# Patient Record
Sex: Female | Born: 1970 | Race: White | Hispanic: No | Marital: Married | Smoking: Former smoker
Health system: Southern US, Community
[De-identification: ages and names within clinical notes are randomized; demographics above are authoritative.]

## PROBLEM LIST (undated history)

## (undated) DIAGNOSIS — R0609 Other forms of dyspnea: Secondary | ICD-10-CM

## (undated) DIAGNOSIS — E119 Type 2 diabetes mellitus without complications: Secondary | ICD-10-CM

## (undated) DIAGNOSIS — R252 Cramp and spasm: Secondary | ICD-10-CM

## (undated) DIAGNOSIS — K219 Gastro-esophageal reflux disease without esophagitis: Secondary | ICD-10-CM

## (undated) DIAGNOSIS — F329 Major depressive disorder, single episode, unspecified: Secondary | ICD-10-CM

## (undated) DIAGNOSIS — R519 Headache, unspecified: Secondary | ICD-10-CM

## (undated) DIAGNOSIS — M1990 Unspecified osteoarthritis, unspecified site: Secondary | ICD-10-CM

## (undated) DIAGNOSIS — R51 Headache: Secondary | ICD-10-CM

## (undated) DIAGNOSIS — E538 Deficiency of other specified B group vitamins: Secondary | ICD-10-CM

## (undated) DIAGNOSIS — K589 Irritable bowel syndrome without diarrhea: Secondary | ICD-10-CM

## (undated) DIAGNOSIS — I2510 Atherosclerotic heart disease of native coronary artery without angina pectoris: Secondary | ICD-10-CM

## (undated) DIAGNOSIS — J302 Other seasonal allergic rhinitis: Secondary | ICD-10-CM

## (undated) DIAGNOSIS — T7840XA Allergy, unspecified, initial encounter: Secondary | ICD-10-CM

## (undated) DIAGNOSIS — D649 Anemia, unspecified: Secondary | ICD-10-CM

## (undated) DIAGNOSIS — F419 Anxiety disorder, unspecified: Secondary | ICD-10-CM

## (undated) DIAGNOSIS — G43909 Migraine, unspecified, not intractable, without status migrainosus: Secondary | ICD-10-CM

## (undated) DIAGNOSIS — R918 Other nonspecific abnormal finding of lung field: Secondary | ICD-10-CM

## (undated) DIAGNOSIS — L409 Psoriasis, unspecified: Secondary | ICD-10-CM

## (undated) DIAGNOSIS — M62838 Other muscle spasm: Secondary | ICD-10-CM

## (undated) DIAGNOSIS — N200 Calculus of kidney: Secondary | ICD-10-CM

## (undated) DIAGNOSIS — G4730 Sleep apnea, unspecified: Secondary | ICD-10-CM

## (undated) DIAGNOSIS — F32A Depression, unspecified: Secondary | ICD-10-CM

## (undated) DIAGNOSIS — R55 Syncope and collapse: Secondary | ICD-10-CM

## (undated) DIAGNOSIS — E559 Vitamin D deficiency, unspecified: Secondary | ICD-10-CM

## (undated) DIAGNOSIS — J45909 Unspecified asthma, uncomplicated: Secondary | ICD-10-CM

## (undated) DIAGNOSIS — R0902 Hypoxemia: Secondary | ICD-10-CM

## (undated) HISTORY — DX: Unspecified asthma, uncomplicated: J45.909

## (undated) HISTORY — PX: PLANTAR FASCIA SURGERY: SHX746

## (undated) HISTORY — DX: Atherosclerotic heart disease of native coronary artery without angina pectoris: I25.10

## (undated) HISTORY — PX: UPPER GI ENDOSCOPY: SHX6162

## (undated) HISTORY — DX: Calculus of kidney: N20.0

## (undated) HISTORY — DX: Type 2 diabetes mellitus without complications: E11.9

## (undated) HISTORY — DX: Other nonspecific abnormal finding of lung field: R91.8

## (undated) HISTORY — DX: Anemia, unspecified: D64.9

## (undated) HISTORY — PX: TONSILLECTOMY: SUR1361

## (undated) HISTORY — DX: Cramp and spasm: R25.2

## (undated) HISTORY — DX: Vitamin D deficiency, unspecified: E55.9

## (undated) HISTORY — DX: Other forms of dyspnea: R06.09

## (undated) HISTORY — DX: Irritable bowel syndrome, unspecified: K58.9

## (undated) HISTORY — DX: Other seasonal allergic rhinitis: J30.2

## (undated) HISTORY — DX: Other muscle spasm: M62.838

## (undated) HISTORY — DX: Unspecified osteoarthritis, unspecified site: M19.90

## (undated) HISTORY — DX: Syncope and collapse: R55

## (undated) HISTORY — PX: HERNIA REPAIR: SHX51

## (undated) HISTORY — DX: Allergy, unspecified, initial encounter: T78.40XA

## (undated) HISTORY — PX: APPENDECTOMY: SHX54

## (undated) HISTORY — DX: Gastro-esophageal reflux disease without esophagitis: K21.9

## (undated) HISTORY — DX: Hypoxemia: R09.02

---

## 1997-07-08 ENCOUNTER — Inpatient Hospital Stay (HOSPITAL_COMMUNITY): Admission: AD | Admit: 1997-07-08 | Discharge: 1997-07-08 | Payer: Self-pay | Admitting: *Deleted

## 1997-12-07 ENCOUNTER — Encounter: Admission: RE | Admit: 1997-12-07 | Discharge: 1998-03-07 | Payer: Self-pay | Admitting: *Deleted

## 1998-01-14 ENCOUNTER — Inpatient Hospital Stay (HOSPITAL_COMMUNITY): Admission: AD | Admit: 1998-01-14 | Discharge: 1998-01-14 | Payer: Self-pay | Admitting: Obstetrics and Gynecology

## 1998-01-15 ENCOUNTER — Inpatient Hospital Stay (HOSPITAL_COMMUNITY): Admission: AD | Admit: 1998-01-15 | Discharge: 1998-01-15 | Payer: Self-pay | Admitting: *Deleted

## 1998-01-17 ENCOUNTER — Inpatient Hospital Stay (HOSPITAL_COMMUNITY): Admission: AD | Admit: 1998-01-17 | Discharge: 1998-01-17 | Payer: Self-pay | Admitting: Obstetrics and Gynecology

## 1998-02-07 ENCOUNTER — Inpatient Hospital Stay (HOSPITAL_COMMUNITY): Admission: AD | Admit: 1998-02-07 | Discharge: 1998-02-09 | Payer: Self-pay | Admitting: *Deleted

## 1998-02-14 ENCOUNTER — Encounter (HOSPITAL_COMMUNITY): Admission: RE | Admit: 1998-02-14 | Discharge: 1998-05-15 | Payer: Self-pay | Admitting: *Deleted

## 2001-04-11 ENCOUNTER — Other Ambulatory Visit: Admission: RE | Admit: 2001-04-11 | Discharge: 2001-04-11 | Payer: Self-pay | Admitting: Obstetrics and Gynecology

## 2001-08-26 ENCOUNTER — Encounter: Admission: RE | Admit: 2001-08-26 | Discharge: 2001-08-26 | Payer: Self-pay | Admitting: Obstetrics and Gynecology

## 2001-10-13 ENCOUNTER — Encounter: Payer: Self-pay | Admitting: *Deleted

## 2001-10-13 ENCOUNTER — Inpatient Hospital Stay (HOSPITAL_COMMUNITY): Admission: AD | Admit: 2001-10-13 | Discharge: 2001-10-13 | Payer: Self-pay | Admitting: Obstetrics and Gynecology

## 2001-10-26 ENCOUNTER — Inpatient Hospital Stay (HOSPITAL_COMMUNITY): Admission: AD | Admit: 2001-10-26 | Discharge: 2001-10-28 | Payer: Self-pay | Admitting: Obstetrics and Gynecology

## 2001-10-29 ENCOUNTER — Encounter: Admission: RE | Admit: 2001-10-29 | Discharge: 2001-11-28 | Payer: Self-pay | Admitting: Obstetrics and Gynecology

## 2001-12-03 ENCOUNTER — Other Ambulatory Visit: Admission: RE | Admit: 2001-12-03 | Discharge: 2001-12-03 | Payer: Self-pay | Admitting: Obstetrics and Gynecology

## 2003-01-04 ENCOUNTER — Other Ambulatory Visit: Admission: RE | Admit: 2003-01-04 | Discharge: 2003-01-04 | Payer: Self-pay | Admitting: Obstetrics & Gynecology

## 2004-01-17 ENCOUNTER — Ambulatory Visit: Payer: Self-pay

## 2004-01-20 ENCOUNTER — Ambulatory Visit: Payer: Self-pay

## 2004-02-09 ENCOUNTER — Ambulatory Visit: Payer: Self-pay

## 2004-08-16 ENCOUNTER — Ambulatory Visit: Payer: Self-pay

## 2005-03-12 ENCOUNTER — Ambulatory Visit: Payer: Self-pay | Admitting: Unknown Physician Specialty

## 2005-12-26 ENCOUNTER — Ambulatory Visit: Payer: Self-pay | Admitting: Gastroenterology

## 2006-08-30 ENCOUNTER — Inpatient Hospital Stay (HOSPITAL_COMMUNITY): Admission: RE | Admit: 2006-08-30 | Discharge: 2006-09-06 | Payer: Self-pay | Admitting: Obstetrics and Gynecology

## 2006-09-03 ENCOUNTER — Encounter: Payer: Self-pay | Admitting: Obstetrics and Gynecology

## 2006-09-06 ENCOUNTER — Encounter: Payer: Self-pay | Admitting: Obstetrics and Gynecology

## 2006-12-17 ENCOUNTER — Inpatient Hospital Stay (HOSPITAL_COMMUNITY): Admission: RE | Admit: 2006-12-17 | Discharge: 2006-12-19 | Payer: Self-pay | Admitting: Obstetrics and Gynecology

## 2008-02-20 HISTORY — PX: BLADDER SURGERY: SHX569

## 2008-02-20 HISTORY — PX: CHOLECYSTECTOMY: SHX55

## 2008-11-22 ENCOUNTER — Ambulatory Visit: Payer: Self-pay | Admitting: Physician Assistant

## 2008-12-27 ENCOUNTER — Ambulatory Visit: Payer: Self-pay | Admitting: Surgery

## 2008-12-27 ENCOUNTER — Ambulatory Visit: Payer: Self-pay | Admitting: Anesthesiology

## 2009-02-08 ENCOUNTER — Ambulatory Visit (HOSPITAL_COMMUNITY): Admission: RE | Admit: 2009-02-08 | Discharge: 2009-02-08 | Payer: Self-pay | Admitting: Obstetrics and Gynecology

## 2009-02-11 ENCOUNTER — Inpatient Hospital Stay (HOSPITAL_COMMUNITY): Admission: AD | Admit: 2009-02-11 | Discharge: 2009-02-11 | Payer: Self-pay | Admitting: Obstetrics and Gynecology

## 2009-03-24 ENCOUNTER — Encounter: Admission: RE | Admit: 2009-03-24 | Discharge: 2009-03-24 | Payer: Self-pay | Admitting: Obstetrics and Gynecology

## 2010-02-19 HISTORY — PX: LAPAROSCOPIC GASTRIC SLEEVE RESECTION: SHX5895

## 2010-05-22 LAB — BASIC METABOLIC PANEL
Chloride: 105 mEq/L (ref 96–112)
Creatinine, Ser: 0.53 mg/dL (ref 0.4–1.2)
GFR calc Af Amer: >60 mL/min (ref >60)
Potassium: 3.6 mEq/L (ref 3.5–5.1)
Sodium: 135 mEq/L (ref 135–145)

## 2010-05-22 LAB — CBC
HCT: 39.5 % (ref 36.0–46.0)
Hemoglobin: 13.2 g/dL (ref 12.0–15.0)
MCV: 86.5 fL (ref 78.0–100.0)
RBC: 4.56 MIL/uL (ref 3.87–5.11)
WBC: 9.4 10*3/uL (ref 4.0–10.5)

## 2010-05-22 LAB — HCG, SERUM, QUALITATIVE: Preg, Serum: NEGATIVE

## 2010-07-04 NOTE — H&P (Signed)
NAME:  Claire [name], Claire [name] NO.:  [id number]   MEDICAL RECORD NO.:  [id number]          PATIENT TYPE:  INP   LOCATION:                                FACILITY:  [facility]   PHYSICIAN:  Lenoard Aden, M.D.DATE OF BIRTH:  1970/11/08   DATE OF ADMISSION:  DATE OF DISCHARGE:                              HISTORY & PHYSICAL   CHIEF COMPLAINT:  Advanced dilatation with a history of worsening  diabetic control for induction.   HISTORY:  She is a 40 year old white female G3, P-2 at 37+ weeks  gestation at 5 cm dilatation with new onset borderline glucose control  and borderline elevated blood pressures for augmentation and delivery.   ALLERGIES:  NO KNOWN DRUG ALLERGIES.   MEDICATIONS:  1. Glyburide.  2. Prenatal vitamins.   SOCIAL HISTORY:  She is a nonsmoker and nondrinker.  Denies domestic  physical violence.   PAST MEDICAL HISTORY:  1. History of spontaneous vaginal delivery x 2.  2. History of emergency cerclage with a gestation at 21 weeks followed      biweekly for gestational and p.r.n. Procardia use.   PHYSICAL EXAMINATION:  GENERAL:  She is a well-developed, well-nourished  white female in no acute distress.  HEENT:  Normal.  LUNGS:  Clear.  HEART:  Regular.  ABDOMEN:  Soft, gravid and nontender.  Estimated fetal weight is 7.5 to  8 pounds.  Cervix is 5 cm and 100%, vertex and 0 station.  EXTREMITIES:  Reveal no cords.  NEUROLOGIC:  Nonfocal.  SKIN:  Intact.   IMPRESSION:  1. A 37 week intrauterine pregnancy.  2. Non-insulin-dependent diabetes mellitus with worsening control.  3. Borderline elevation of blood pressure.   PLAN:  1. We will monitor blood pressure closely.  2. Epidural as needed.  3. Check blood glucoses every 2 hours and Glucommander as needed.  4. Anticipated attempts at vaginal delivery.      Lenoard Aden, M.D.  Electronically Signed     RJT/MEDQ  D:  12/17/2006  T:  12/17/2006  Job:  [id number]

## 2010-07-04 NOTE — Op Note (Signed)
NAMESHARAYA, [name] NO.:  [id number]   MEDICAL RECORD NO.:  [id number]          PATIENT TYPE:  INP   LOCATION:  9115                          FACILITY:  [facility]   PHYSICIAN:  Lenoard Aden, M.D.DATE OF BIRTH:  05-13-1970   DATE OF PROCEDURE:  08/30/2006  DATE OF DISCHARGE:                               OPERATIVE REPORT   PREOPERATIVE DIAGNOSIS:  A 21-week intrauterine pregnancy with cervical  insufficiency.   POSTOPERATIVE DIAGNOSIS:  A 21-week intrauterine pregnancy with cervical  insufficiency.   PROCEDURE:  Emergency McDonald cervical cerclage.   SURGEON:  Lenoard Aden, M.D.   ASSISTANT:  _Lavoie_________   ANESTHESIA:  Spinal by Quillian Quince, M.D.   ESTIMATED BLOOD LOSS:  50 mL.   COMPLICATIONS:  None.   DRAINS:  Foley.   COUNTS:  Correct.   DISPOSITION:  The patient went to Recovery in good condition.   PROCEDURE IN DETAIL:  After being apprised of the risks of anesthesia,  infection, bleeding, injury to abdominal organs with need for repair,  possibility of rupture of membranes, possibility of infection, need for  cerclage removal, the patient was brought to the operating room.  She  was administered a spinal anesthetic without complications.  Her feet  were placed in the stirrups.  The patient was prepped and draped in  usual sterile fashion.  A Foley catheter was placed.  She was prepped  with Hibiclens solution.  A weighted speculum was placed.  A Deaver was  placed.  Visualization reveals bulging membranes at the external  cervical os.  A 30 cc Foley, 16-French was placed, and 30 cc was  insufflated into the bladder after the Foley was cut off at the tip.  Good retraction of the membranes were noted.  A #5 Ethibond suture was  used to place a cerclage from 1 o'clock to 11 o'clock, 11 o'clock to 7  o'clock, 7 o'clock to 5 o'clock, 5 o'clock to 2 o'clock, and 2 o'clock  to 1 o'clock and tied over a Prolene suture as the Foley  was  removed.  Good hemostasis was noted.  The cervix was well coapted at the  internal os.  No bulging membranes were noted.  Internal os appears  closed. A cervical length of approximately 2.5 cm was noted.  The  patient tolerated the procedure well. All instruments were removed.  She  was transferred to Recovery in good condition.      Lenoard Aden, M.D.  Electronically Signed     RJT/MEDQ  D:  08/30/2006  T:  09/01/2006  Job:  [id number]

## 2010-07-07 NOTE — H&P (Signed)
   [name], [name] NO.:  [id number]   MEDICAL RECORD NO.:  [id number]                   PATIENT TYPE:  INP   LOCATION:  9162                                 [facility]ILITY:  [facility]   PHYSICIAN:  Lenoard Aden, M.D.             DATE OF BIRTH:  Oct 16, 1970   DATE OF ADMISSION:  10/26/2001  DATE OF DISCHARGE:                                HISTORY & PHYSICAL   CHIEF COMPLAINT:  Advanced cervical dilatation requiring induction.   HISTORY OF PRESENT ILLNESS:  Patient is a 40 year old white female G2, P1  with EDD of November 16, 2001 with a history of precipitous vaginal birth  and advanced dilatation requiring induction.   PAST MEDICAL HISTORY:  Vaginal delivery x one.   ALLERGIES:  History of allergy to ricotta cheese.   MEDICATIONS:  Prenatal vitamins.   HISTORY:  Gestation diabetes in pregnancy.   FAMILY HISTORY:  1. Myocardial infarction.  2. Anemia.  3. Diabetes.   PERSONAL HISTORY:  1. Acid reflux on Prevacid.  2. Asthma on albuterol inhaler.   PAST SURGICAL HISTORY:  1. Foot surgery in 1996 and 2001.  2. Sinus surgery.   PRENATAL COURSE:  Complicated by advanced cervical dilatation since 24  weeks' on bed rest.  Weekly progesterone therapy given.  Betamethasone  administered.   PHYSICAL EXAMINATION:  GENERAL:  Well-developed, well-nourished white  female.  No acute distress.  HEENT:  Normal.  LUNGS:  Clear.  HEART:  Regular rate and rhythm.  ABDOMEN:  Abdomen is soft, gravid, nontender.  Estimated fetal weight 6.5 -  7.0 pounds.  PELVIC:  Cervix 5 cm, 80%, vertex, -1 station.  EXTREMITIES:  No cords.  NEURO:  Nonfocal.    IMPRESSION:  A 38-week intrauterine pregnancy with cervical dilatation and  advanced geographic distance from hospital for induction.   PLAN:  Proceed with controlled attempts at vaginal delivery AROM and Pitocin  augmentation as needed.                                                 Lenoard Aden, M.D.    RJT/MEDQ  D:  10/26/2001  T:  10/26/2001  Job:  [id number]

## 2010-07-07 NOTE — Discharge Summary (Signed)
Claire [name], BIRCHARD NO.:  [id number]   MEDICAL RECORD NO.:  [id number]          PATIENT TYPE:  INP   LOCATION:  9157                          [facility]ILITY[facility]   PHYSICIAN:  Lenoard Aden, M.D.DATE OF BIRTH:  Dec 29, 1970   DATE OF ADMISSION:  08/30/2006  DATE OF DISCHARGE:  09/06/2006                               DISCHARGE SUMMARY   The patient was admitted for cervical incompetence at 21 weeks with  advanced dilatation to 3 cm noted.  She had an emergency cerclage placed  upon admission on August 30, 2006.  Postoperative course uncomplicated.  Stable cervical length noted.  She is discharged to home day six.  Follow up in the office 1 week.  Discharge teaching done, prenatal  vitamins and iron given.      Lenoard Aden, M.D.  Electronically Signed     RJT/MEDQ  D:  10/23/2006  T:  10/24/2006  Job:  [id number]

## 2010-07-12 ENCOUNTER — Ambulatory Visit: Payer: Self-pay | Admitting: Family Medicine

## 2010-08-15 ENCOUNTER — Ambulatory Visit: Payer: Self-pay | Admitting: Family Medicine

## 2010-11-29 LAB — DIFFERENTIAL
Basophils Relative: 0
Monocytes Absolute: 0.5
Monocytes Relative: 6
Neutro Abs: 7

## 2010-11-29 LAB — CBC
HCT: 24.1 — ABNORMAL LOW
Hemoglobin: 9.9 — ABNORMAL LOW
MCHC: 33.6
MCV: 74.6 — ABNORMAL LOW
Platelets: 240
RBC: 3.23 — ABNORMAL LOW
RBC: 3.88
WBC: 9.4

## 2010-12-05 LAB — DIFFERENTIAL
Basophils Absolute: 0
Lymphocytes Relative: 18
Neutro Abs: 8 — ABNORMAL HIGH

## 2010-12-05 LAB — CBC
HCT: 32.1 — ABNORMAL LOW
MCHC: 33.9
MCV: 82.5
Platelets: 291
Platelets: 310
RDW: 14.6 — ABNORMAL HIGH
RDW: 14.7 — ABNORMAL HIGH

## 2011-07-23 ENCOUNTER — Ambulatory Visit: Payer: Self-pay | Admitting: Urology

## 2012-02-20 HISTORY — PX: BREAST BIOPSY: SHX20

## 2012-02-20 HISTORY — PX: COLONOSCOPY: SHX174

## 2012-06-30 ENCOUNTER — Ambulatory Visit: Payer: Self-pay | Admitting: Urology

## 2012-08-15 DIAGNOSIS — K589 Irritable bowel syndrome without diarrhea: Secondary | ICD-10-CM | POA: Insufficient documentation

## 2012-09-08 ENCOUNTER — Ambulatory Visit: Payer: Self-pay | Admitting: Family Medicine

## 2012-09-11 ENCOUNTER — Ambulatory Visit: Payer: Self-pay | Admitting: Family Medicine

## 2012-09-18 ENCOUNTER — Ambulatory Visit (INDEPENDENT_AMBULATORY_CARE_PROVIDER_SITE_OTHER): Payer: BC Managed Care – PPO | Admitting: General Surgery

## 2012-09-18 ENCOUNTER — Encounter: Payer: Self-pay | Admitting: General Surgery

## 2012-09-18 VITALS — BP 110/62 | HR 74 | Resp 12 | Ht 61.0 in | Wt 153.0 lb

## 2012-09-18 DIAGNOSIS — R928 Other abnormal and inconclusive findings on diagnostic imaging of breast: Secondary | ICD-10-CM

## 2012-09-18 NOTE — Patient Instructions (Addendum)
Breast Biopsy, Stereotactic A stereotactic breast biopsy takes a tissue sample from the breast with a special instrument. This is done when:  The problem (lump, abnormality, mass) can be seen on X-ray, but not felt on physical exam.  Suspicious, small calcium deposits (calcifications) are seen in the breast.  There is a change in shape or appearance of the breast, thickening, or asymmetry on mammogram (breast X-ray).  You have nipple changes (unusual or bloody discharge, crusting, retraction, dimpling).  Your caregiver is making a surgical diagnosis. The biopsy may be done on a special table, with your face down and your breasts placed through openings in the table. Computerized imaging (special form of X-rays) is used. The images are not obtained using regular X-ray film. So, exposure to radiation is reduced. Images are seen through several different angles. The surgeon removes small pieces of the suspicious tissue through a hollow needle. The tissue will be sent to the lab for analysis. The surgeon can look at the pictures right away, rather than wait for an X-ray to be developed. Your caregiver can mark the lesions (abnormal tissue formations) electronically. Then the computer can tell exactly where the problem is, or if it has moved. BENEFITS OF THE PROCEDURE  This is a good way to see if tiny lumps, abnormal looking tissue, or calcium deposits that you cannot feel are cancerous or require further treatment or follow-up.  Needle biopsy is a simple procedure. It may be performed in an outpatient imaging center. This means you have the procedure and go home the same day, without checking into a hospital.  It is less painful than open surgery. The results are as accurate as when a tissue sample is removed surgically.  The procedure is faster, less expensive, less invasive, does not distort the breast, and leaves little or no scar.  Breast defects, which can make future mammograms hard to read  and interpret, do not remain.  Recovery time is brief. Patients can soon resume their normal activities.  Using VAD (vacuum assisted device) may make it possible to remove entire lesions.  A breast biopsy can indicate if you need surgery, other treatment, or combined treatment. LET YOUR CAREGIVER KNOW ABOUT:  Allergies.  Medications taken, including herbs, eye drops, over-the-counter medications, and creams.  Use of steroids (by mouth or creams).  Previous problems with anesthetics or numbing medication.  If you are taking blood thinner medications or aspirin.  Possibility of pregnancy, if this applies.  History of blood clots (thrombophlebitis).  History of bleeding or blood problems.  Previous surgery.  Other health problems. RISKS AND COMPLICATIONS  Infection (germ growing in the wound). This can often be treated with antibiotics.  Bleeding, following surgery. Your surgeon takes every precaution to keep this from happening.  There is some concern that if a cancerous mass is present, cancer cells might be spread by the needle. Whether this actually happens is not known. It does not appear to be a significant risk.  X-ray guided breast biopsy is not infallible (not always correct). The problem may be missed or the extent of the problem may be underestimated. This would mean the biopsy did not manage to remove a piece of the diseased tissue or enough of the diseased tissue.  Lesions present, with calcium deposits scattered throughout the breast, are difficult to target by stereotactic method. Those lesions near the chest wall also are hard to learn about by this method. If the mammogram shows only a vague change in tissue density,  but no definite mass or nodule, the X-ray guided method may not be successful. Occasionally, even after a successful biopsy, the tissue diagnosis remains uncertain. A surgical biopsy will be needed, if abnormal or precancerous cells are found on core  biopsy.  Altering or deforming of the breast.  Unable to find, or missing the lesion.  Rarely, the needle may go through the chest wall into the lung area. TWO BIOPSY INSTRUMENTS MAY BE USED IN THE PROCEDURE The conventional biopsy device (core needle biopsy device) consists of an inner needle with a trough extending from it at one end, and an overlying sheath. It is attached to a spring-loaded mechanism that propels it forward. The trough fills with tissue. The outer sheath instantly moves forward to cut the tissue and keep it in the trough. Each sample is obtained in a fraction of a second. It is necessary to withdraw the needle after each sample is taken to collect the tissue.  A newer type of instrument, the VAD (vacuum assisted device), uses vacuum pressure to pull breast tissue into a needle and remove it. The needle does not need to be withdrawn after each sampling. Another advantage is that biopsies are obtained in an orderly manner, by rotating the device. This helps make sure that the entire area of interest will be sampled. When using the automated core biopsy needle, sampling is more random.  FOR COMFORT DURING THE TEST  Relax as much as possible.  Try to follow instructions, to speed up the test.  Let your caregiver know if you are uncomfortable, anxious, or in pain. PROCEDURE  You are awake during the procedure, and you go home the same day (outpatient). A specially trained radiologist will do this procedure. First, the skin is cleansed. Then, it is injected with a local anesthetic. A small nick is made in the skin, and the tip of the biopsy needle is put into the calculated site of the lesion. A special mammography machine uses ionizing radiation to help guide the radiologist's instrument to the site of the abnormal growth. At this point, stereo images are again obtained, to confirm that the needle tip is at the problem area. Usually 5 to 10 samples are collected when doing a core  biopsy. At least 12 are collected when using the vacuum assisted device (VAD). Then, a final set of images is obtained. If they show that the lesion has been mostly or completely removed, a small clip is left at the biopsy site. This is so that it can be easily located, in case the lesion turns out to be cancer. Afterward, the skin opening is stitched (sutured) or taped closed, and covered with a dressing. Your caregiver may apply a pressure dressing and an ice pack, to prevent bleeding and swelling in the breast.  X-ray guided breast biopsy can take 30 minutes to 1 hour, or more. The X-rays usually have no side effects, and no radiation remains in your body. There is usually little or no pain. Usually no scar is left from the tiny skin incision. Many women find that the major discomfort of the procedure is from lying on their stomach, or staying in 1 position for the length of the procedure. This discomfort may be reduced by carefully placed cushions. You should wear a good support bra to the procedure. You will be asked to remove jewelry, dentures, eye glasses, metal objects, or clothing that might interfere with the X-ray images. You may want to have someone with you, to  take you home after the procedure. AFTER THE PROCEDURE   After surgery, if you are doing well and have no problems, you will be allowed to go home.  You may resume your regular diet, or as directed by your caregiver. HOME CARE INSTRUCTIONS   Follow your caregiver's recommendations for medications, care of the biopsy site, follow-up appointments, and further treatment.  Only take over-the-counter or prescription medicines for pain, discomfort, or fever as directed by your caregiver.  An ice pack applied to the affected area may help with discomfort and keep the swelling down.  Change dressings as directed.  Wear a good support bra for as long as your caregiver recommends.  Avoid strenuous activity for at least 24 hours, or as  advised by your caregiver. Finding out the results of your test Not all test results are available during your visit. If your test results are not back during the visit, make an appointment with your caregiver to find out the results. Do not assume everything is normal if you have not heard from your caregiver or the medical facility. It is important for you to follow up on all of your test results.  SEEK MEDICAL CARE IF:   You develop a rash.  You have problems with your medicines.  You become lightheaded or dizzy. SEEK IMMEDIATE MEDICAL CARE IF:   There is increased bleeding (more than a small spot) from the biopsy site.  You notice redness, swelling, or increasing pain in the wound.  Pus is coming from the wound.  You have a fever.  You notice a bad smell coming from the wound or dressing.  You develop shortness of breath.  You develop chest pain.  You pass out. Document Released: 11/04/2002 Document Revised: 04/30/2011 Document Reviewed: 12/10/2008 [facility] Patient Information 2014 [place], [place].  Patient has been scheduled for a right breast stereotactic biopsy at [facility] for 10-06-12 at 1 pm. She will check-in at the [facility] at 12:30 pm. This patient is aware of date, time, and instructions. Patient verbalizes understanding.

## 2012-09-18 NOTE — Progress Notes (Signed)
Patient ID: Claire [name], female   DOB: [birth year], 42 y.o.   MRN: [id number]  Chief Complaint  Patient presents with  . Breast Mass    HPI Claire [name] is a 42 y.o. female who presents for a breast evaluation. The most recent screening mammogram was done on 09/08/12 with additional views of right breast and ultrasound on 09/11/12.Patient does perform regular self breast checks and gets regular mammograms done. Patient does have a family history of breast cancer. Last mammogram done 1998 d/t family history. Patient denies being able to feel anything in the breast. She does say she has some tenderness in the right breast on the lateral side that seems to be related to her menstrual cycle.   HPI  History reviewed. No pertinent past medical history.  Past Surgical History  Procedure Laterality Date  . Laparoscopic gastric sleeve resection  2012  . Cholecystectomy  2010  . Bladder surgery  2010  . Plantar fascia surgery Bilateral     Family History  Problem Relation Age of Onset  . Breast cancer Paternal Aunt   . Breast cancer Paternal Grandmother   . Lung cancer Maternal Grandfather     Social History History  Substance Use Topics  . Smoking status: Former Smoker -- 1.00 packs/day for 10 years  . Smokeless tobacco: Never Used  . Alcohol Use: No    Allergies  Allergen Reactions  . Vicodin (Hydrocodone-Acetaminophen) Itching    Current Outpatient Prescriptions  Medication Sig Dispense Refill  . Calcium-Vitamin D-Vitamin K (CALCIUM + D + K PO) Take 1 tablet by mouth daily.      . Cholecalciferol (VITAMIN D) 1000 UNITS capsule Take 1,000 Units by mouth daily.      . Potassium Aminobenzoate 500 MG CAPS Take 1 capsule by mouth daily.      . vitamin E 1000 UNIT capsule Take 1,000 Units by mouth daily.      Marland Kitchen zolpidem (AMBIEN) 10 MG tablet Take 1 tablet by mouth daily.       No current facility-administered medications for this visit.    Review of Systems Review of  Systems  Constitutional: Negative.   Respiratory: Negative.   Cardiovascular: Negative.     Blood pressure 110/62, pulse 74, resp. rate 12, height 5\' 1"  (1.549 m), weight 153 lb (69.4 kg), last menstrual period 08/28/2012.  Physical Exam Physical Exam  Constitutional: She is oriented to person, place, and time. She appears well-developed and well-nourished.  Eyes: Conjunctivae are normal. No scleral icterus.  Neck: Neck supple.  Cardiovascular: Normal rate and regular rhythm.   Pulmonary/Chest: Effort normal and breath sounds normal. Right breast exhibits no inverted nipple, no mass, no nipple discharge, no skin change and no tenderness. Left breast exhibits no inverted nipple, no mass, no nipple discharge, no skin change and no tenderness.  Lymphadenopathy:    She has no cervical adenopathy.    She has no axillary adenopathy.  Neurological: She is alert and oriented to person, place, and time.    Data Reviewed Mammogram and ultrasound  Assessment    Ultrasound findings are very small and benign appearing. At this point stereo biopsy of the density in the right breast with follow up short term for the other lesions seems appropriate.     Plan    Patient agreeable to stereotactic biopsy. Procedure explained to the patient.     Patient has been scheduled for a right breast stereotactic biopsy at [facility] for 10-06-12 at 1 pm. She  will check-in at the [facility] at 12:30 pm. This patient is aware of date, time, and instructions. Patient verbalizes understanding.   Aprille Sawhney G 09/19/2012, 5:50 AM

## 2012-09-19 ENCOUNTER — Encounter: Payer: Self-pay | Admitting: General Surgery

## 2012-09-19 DIAGNOSIS — R928 Other abnormal and inconclusive findings on diagnostic imaging of breast: Secondary | ICD-10-CM | POA: Insufficient documentation

## 2012-10-06 ENCOUNTER — Ambulatory Visit: Payer: Self-pay | Admitting: General Surgery

## 2012-10-06 DIAGNOSIS — N63 Unspecified lump in unspecified breast: Secondary | ICD-10-CM

## 2012-10-08 ENCOUNTER — Encounter: Payer: Self-pay | Admitting: General Surgery

## 2012-10-08 ENCOUNTER — Telehealth: Payer: Self-pay | Admitting: *Deleted

## 2012-10-08 NOTE — Telephone Encounter (Signed)
Notified patient as instructed, patient pleased. Discussed follow-up appointments, patient agrees  

## 2012-10-08 NOTE — Telephone Encounter (Signed)
Notify patient that the most recent biopsy was benign lymph node per Dr. Evette Cristal.  F/U in 4 months with office u/s ( pt already in recalls).

## 2012-10-15 ENCOUNTER — Ambulatory Visit (INDEPENDENT_AMBULATORY_CARE_PROVIDER_SITE_OTHER): Payer: BC Managed Care – PPO | Admitting: *Deleted

## 2012-10-15 DIAGNOSIS — R928 Other abnormal and inconclusive findings on diagnostic imaging of breast: Secondary | ICD-10-CM

## 2012-10-15 NOTE — Patient Instructions (Addendum)
The patient is aware that a heating pad may be used for comfort as needed. 

## 2012-10-15 NOTE — Progress Notes (Signed)
Patient here today for follow up post right breast stereo biopsy.  Steristrip in place and aware it may come off in one week.  Minimal bruising noted.  The patient is aware that a heating pad may be used for comfort as needed.  Aware of pathology. Follow up as scheduled.

## 2012-11-21 DIAGNOSIS — J4530 Mild persistent asthma, uncomplicated: Secondary | ICD-10-CM | POA: Insufficient documentation

## 2012-11-21 DIAGNOSIS — E668 Other obesity: Secondary | ICD-10-CM | POA: Insufficient documentation

## 2012-11-21 DIAGNOSIS — K5900 Constipation, unspecified: Secondary | ICD-10-CM | POA: Insufficient documentation

## 2012-11-21 DIAGNOSIS — K219 Gastro-esophageal reflux disease without esophagitis: Secondary | ICD-10-CM | POA: Insufficient documentation

## 2012-11-21 DIAGNOSIS — Z8669 Personal history of other diseases of the nervous system and sense organs: Secondary | ICD-10-CM | POA: Insufficient documentation

## 2012-11-21 DIAGNOSIS — G43109 Migraine with aura, not intractable, without status migrainosus: Secondary | ICD-10-CM | POA: Insufficient documentation

## 2012-11-21 DIAGNOSIS — Z8639 Personal history of other endocrine, nutritional and metabolic disease: Secondary | ICD-10-CM | POA: Insufficient documentation

## 2013-02-09 ENCOUNTER — Other Ambulatory Visit: Payer: BC Managed Care – PPO

## 2013-02-09 ENCOUNTER — Ambulatory Visit (INDEPENDENT_AMBULATORY_CARE_PROVIDER_SITE_OTHER): Payer: BC Managed Care – PPO | Admitting: General Surgery

## 2013-02-09 ENCOUNTER — Encounter: Payer: Self-pay | Admitting: General Surgery

## 2013-02-09 VITALS — BP 124/76 | HR 74 | Resp 12 | Ht 61.0 in | Wt 158.0 lb

## 2013-02-09 DIAGNOSIS — N63 Unspecified lump in unspecified breast: Secondary | ICD-10-CM

## 2013-02-09 NOTE — Patient Instructions (Signed)
Patient to have a right diagnostic mammogram done in 1 month and follow up with Dr. Evette Cristal after mammogram is done.

## 2013-02-09 NOTE — Progress Notes (Signed)
Patient ID: Claire [name], female   DOB: 1970/11/02, 42 y.o.   MRN: [id number]  Chief Complaint  Patient presents with  . Follow-up    breast ultrasound    HPI Claire [name] is a 42 y.o. female here today for a right breast ultrasound. No new complaints at this time. Patient had stereo biopsy of one nodule in the right breast 6 months ago. Benign pathology. Here for a follow up on tiny nodules right  breast seen on prior ultrasound.   HPI  History reviewed. No pertinent past medical history.  Past Surgical History  Procedure Laterality Date  . Laparoscopic gastric sleeve resection  2012  . Cholecystectomy  2010  . Bladder surgery  2010  . Plantar fascia surgery Bilateral   . Breast biopsy Right 2014    stereotatic biopsy    Family History  Problem Relation Age of Onset  . Breast cancer Paternal Aunt   . Breast cancer Paternal Grandmother   . Lung cancer Maternal Grandfather     Social History History  Substance Use Topics  . Smoking status: Former Smoker -- 1.00 packs/day for 10 years  . Smokeless tobacco: Never Used  . Alcohol Use: No    Allergies  Allergen Reactions  . Vicodin [Hydrocodone-Acetaminophen] Itching    Current Outpatient Prescriptions  Medication Sig Dispense Refill  . Calcium-Vitamin D-Vitamin K (CALCIUM + D + K PO) Take 1 tablet by mouth daily.      . Cholecalciferol (VITAMIN D) 1000 UNITS capsule Take 1,000 Units by mouth daily.      . ondansetron (ZOFRAN-ODT) 4 MG disintegrating tablet Take 1 tablet by mouth daily.      . Potassium Aminobenzoate 500 MG CAPS Take 1 capsule by mouth daily.      Marland Kitchen tiZANidine (ZANAFLEX) 4 MG tablet Take 1 tablet by mouth daily.      . vitamin E 1000 UNIT capsule Take 1,000 Units by mouth daily.      Marland Kitchen zolpidem (AMBIEN) 10 MG tablet Take 1 tablet by mouth daily.       No current facility-administered medications for this visit.    Review of Systems Review of Systems  Constitutional: Negative.    Respiratory: Negative.   Cardiovascular: Negative.     Blood pressure 124/76, pulse 74, resp. rate 12, height 5\' 1"  (1.549 m), weight 158 lb (71.668 kg), last menstrual period 02/04/2013.  Physical Exam Physical Exam  Constitutional: She is oriented to person, place, and time. She appears well-developed and well-nourished.  Neurological: She is alert and oriented to person, place, and time.  Skin: Skin is warm and dry.  Both breasts with no palpable masses or visible abnormalities. No lymphadenopathy in the neck or axilla.   Data Reviewed Prior Ultrasound.   Assessment    Ultrasound today showing nodules at 8 and 10 o'clock with no suspicious features.     Plan    Patient to return in one month with unilateral right breast diagnostic mammogram. This has been arranged at the [facility] for 03-11-13 at 10:20 am. She is aware of date, time, and instructions. Patient will return to the office for follow up once mammogram is completed.      SANKAR,SEEPLAPUTHUR G 02/10/2013, 5:18 AM

## 2013-02-10 ENCOUNTER — Encounter: Payer: Self-pay | Admitting: General Surgery

## 2013-03-11 ENCOUNTER — Ambulatory Visit: Payer: Self-pay | Admitting: General Surgery

## 2013-03-12 ENCOUNTER — Encounter: Payer: Self-pay | Admitting: General Surgery

## 2013-03-25 ENCOUNTER — Ambulatory Visit (INDEPENDENT_AMBULATORY_CARE_PROVIDER_SITE_OTHER): Payer: BC Managed Care – PPO | Admitting: General Surgery

## 2013-03-25 ENCOUNTER — Encounter: Payer: Self-pay | Admitting: General Surgery

## 2013-03-25 VITALS — BP 124/76 | HR 72 | Resp 12 | Ht 61.0 in | Wt 160.0 lb

## 2013-03-25 DIAGNOSIS — Z9889 Other specified postprocedural states: Secondary | ICD-10-CM

## 2013-03-25 DIAGNOSIS — Z1239 Encounter for other screening for malignant neoplasm of breast: Secondary | ICD-10-CM

## 2013-03-25 NOTE — Progress Notes (Signed)
Patient ID: Claire [name], female   DOB: Aug 19, 1970, 43 y.o.   MRN: [id number]  Chief Complaint  Patient presents with  . Follow-up    mammogram    HPI Claire [name] is a 43 y.o. female.  who presents for her follow up mammogram and breast evaluation. The most recent mammogram(R) was done on 03-11-13.  Patient does perform regular self breast checks and gets regular mammograms done.  No new breast issues. She had a stereo biopsy of right breast mass 6 months ago - benign.  HPI  History reviewed. No pertinent past medical history.  Past Surgical History  Procedure Laterality Date  . Laparoscopic gastric sleeve resection  2012  . Cholecystectomy  2010  . Bladder surgery  2010  . Plantar fascia surgery Bilateral   . Breast biopsy Right 2014    stereotatic biopsy    Family History  Problem Relation Age of Onset  . Breast cancer Paternal Aunt   . Breast cancer Paternal Grandmother   . Lung cancer Maternal Grandfather     Social History History  Substance Use Topics  . Smoking status: Former Smoker -- 1.00 packs/day for 10 years  . Smokeless tobacco: Never Used  . Alcohol Use: No    Allergies  Allergen Reactions  . Vicodin [Hydrocodone-Acetaminophen] Itching    Current Outpatient Prescriptions  Medication Sig Dispense Refill  . Calcium-Vitamin D-Vitamin K (CALCIUM + D + K PO) Take 1 tablet by mouth daily.      . Cholecalciferol (VITAMIN D) 1000 UNITS capsule Take 1,000 Units by mouth daily.      . ondansetron (ZOFRAN-ODT) 4 MG disintegrating tablet Take 1 tablet by mouth daily.      . Potassium Aminobenzoate 500 MG CAPS Take 1 capsule by mouth daily.      Marland Kitchen tiZANidine (ZANAFLEX) 4 MG tablet Take 1 tablet by mouth daily.      . vitamin E 1000 UNIT capsule Take 1,000 Units by mouth daily.      Marland Kitchen zolpidem (AMBIEN) 10 MG tablet Take 1 tablet by mouth daily.       No current facility-administered medications for this visit.    Review of Systems Review of  Systems  Constitutional: Negative.   Respiratory: Negative.   Cardiovascular: Negative.     Blood pressure 124/76, pulse 72, resp. rate 12, height 5\' 1"  (1.549 m), weight 160 lb (72.576 kg), last menstrual period 03/09/2013.  Physical Exam Physical Exam  Constitutional: She is oriented to person, place, and time. She appears well-developed and well-nourished.  Eyes: Conjunctivae are normal. No scleral icterus.  Neck: Neck supple.  Pulmonary/Chest: Right breast exhibits tenderness (Upper Outer Quadrant ). Right breast exhibits no inverted nipple, no mass, no nipple discharge and no skin change. Left breast exhibits no inverted nipple, no mass, no nipple discharge, no skin change and no tenderness.  Lymphadenopathy:    She has no cervical adenopathy.    She has no axillary adenopathy.  Neurological: She is alert and oriented to person, place, and time. She has normal reflexes.  Skin: Skin is warm and dry.    Data Reviewed Mammogram right reviewed Clip in place. No new findings.  Assessment    Stable exam      Plan    Patient to return in 6 months for bilateral screening mammogram.        Junie Panning G 03/25/2013, 6:56 PM

## 2013-03-25 NOTE — Patient Instructions (Addendum)
Continue self breast exams. Call office for any new breast issues or concerns.   Patient to return in 6 months for bilateral screening mammogram.

## 2013-08-12 ENCOUNTER — Ambulatory Visit: Payer: Self-pay | Admitting: Family Medicine

## 2013-09-16 ENCOUNTER — Ambulatory Visit: Payer: Self-pay | Admitting: Family Medicine

## 2013-09-28 ENCOUNTER — Encounter: Payer: Self-pay | Admitting: General Surgery

## 2013-10-07 ENCOUNTER — Ambulatory Visit (INDEPENDENT_AMBULATORY_CARE_PROVIDER_SITE_OTHER): Payer: BC Managed Care – PPO | Admitting: General Surgery

## 2013-10-07 ENCOUNTER — Encounter: Payer: Self-pay | Admitting: General Surgery

## 2013-10-07 VITALS — BP 130/70 | HR 76 | Resp 12 | Ht 61.0 in | Wt 152.0 lb

## 2013-10-07 DIAGNOSIS — Z1239 Encounter for other screening for malignant neoplasm of breast: Secondary | ICD-10-CM

## 2013-10-07 NOTE — Patient Instructions (Addendum)
Patient to return as needed. Continue self breast exams. Call office for any new breast issues or concerns.'  

## 2013-10-07 NOTE — Progress Notes (Signed)
Patient ID: Claire [name], female   DOB: 28-Sep-1970, 43 y.o.   MRN: [id number]  Chief Complaint  Patient presents with  . Follow-up    mammogram    HPI Claire [name] is a 43 y.o. female who presents for a breast evaluation. The most recent mammogram was done on 09/23/13 at [facility].  Marland Kitchen Patient does perform regular self breast checks and gets regular mammograms done.  One year ago she had stereotatic biopsy of a right breast mass- benign lymph node.  No new complaints.   HPI  Past Medical History  Diagnosis Date  . GERD (gastroesophageal reflux disease)     Past Surgical History  Procedure Laterality Date  . Laparoscopic gastric sleeve resection  2012  . Cholecystectomy  2010  . Bladder surgery  2010  . Plantar fascia surgery Bilateral   . Breast biopsy Right 2014    stereotatic biopsy    Family History  Problem Relation Age of Onset  . Breast cancer Paternal Aunt   . Breast cancer Paternal Grandmother   . Lung cancer Maternal Grandfather     Social History History  Substance Use Topics  . Smoking status: Former Smoker -- 1.00 packs/day for 10 years  . Smokeless tobacco: Never Used  . Alcohol Use: No    Allergies  Allergen Reactions  . Vicodin [Hydrocodone-Acetaminophen] Itching    Current Outpatient Prescriptions  Medication Sig Dispense Refill  . Ascorbic Acid (VITAMIN C) 1000 MG tablet Take 1,000 mg by mouth.      . Calcium-Vitamin D-Vitamin K (CALCIUM + D + K PO) Take 1 tablet by mouth daily.      . Cholecalciferol (VITAMIN D) 1000 UNITS capsule Take 1,000 Units by mouth daily.      . lansoprazole (PREVACID) 30 MG capsule       . ondansetron (ZOFRAN-ODT) 4 MG disintegrating tablet Take 1 tablet by mouth daily.      . Potassium Aminobenzoate 500 MG CAPS Take 1 capsule by mouth daily.      Marland Kitchen tiZANidine (ZANAFLEX) 4 MG tablet Take 1 tablet by mouth daily.      Marland Kitchen topiramate (TOPAMAX) 50 MG tablet       . vitamin E 1000 UNIT capsule Take 1,000 Units by mouth  daily.      Marland Kitchen zolpidem (AMBIEN) 10 MG tablet Take 1 tablet by mouth daily.       No current facility-administered medications for this visit.    Review of Systems Review of Systems  Constitutional: Negative.   Respiratory: Negative.   Cardiovascular: Negative.     Blood pressure 130/70, pulse 76, resp. rate 12, height 5\' 1"  (1.549 m), weight 152 lb (68.947 kg), last menstrual period 09/28/2013.  Physical Exam Physical Exam  Constitutional: She is oriented to person, place, and time. She appears well-developed and well-nourished.  Eyes: Conjunctivae are normal. No scleral icterus.  Neck: Neck supple.  Cardiovascular: Normal rate, regular rhythm and normal heart sounds.   Pulmonary/Chest: Effort normal and breath sounds normal. Right breast exhibits no inverted nipple, no mass, no nipple discharge, no skin change and no tenderness. Left breast exhibits no inverted nipple, no mass, no nipple discharge, no skin change and no tenderness.  Lymphadenopathy:    She has no cervical adenopathy.    She has no axillary adenopathy.  Neurological: She is alert and oriented to person, place, and time.  Skin: Skin is warm and dry.    Data Reviewed Mammogram reviewed and stable.  Assessment  Stable, patient has no ongoing issues with her breast.  Her risk status is average. She can be followed by her PCP and gyn. She may return here if anything new develops.     Plan    Patient to return as needed.         Claire [name] 10/08/2013, 8:25 AM

## 2013-10-08 ENCOUNTER — Encounter: Payer: Self-pay | Admitting: General Surgery

## 2013-12-03 DIAGNOSIS — Z6827 Body mass index (BMI) 27.0-27.9, adult: Secondary | ICD-10-CM | POA: Insufficient documentation

## 2013-12-03 DIAGNOSIS — Z9884 Bariatric surgery status: Secondary | ICD-10-CM | POA: Insufficient documentation

## 2013-12-21 ENCOUNTER — Encounter: Payer: Self-pay | Admitting: General Surgery

## 2014-03-05 ENCOUNTER — Ambulatory Visit: Payer: Self-pay | Admitting: Unknown Physician Specialty

## 2014-04-23 ENCOUNTER — Emergency Department: Payer: Self-pay | Admitting: Emergency Medicine

## 2014-06-14 LAB — SURGICAL PATHOLOGY

## 2014-06-21 ENCOUNTER — Encounter: Payer: Self-pay | Admitting: *Deleted

## 2014-06-22 ENCOUNTER — Encounter: Payer: Self-pay | Admitting: Cardiovascular Disease

## 2014-06-22 ENCOUNTER — Encounter (INDEPENDENT_AMBULATORY_CARE_PROVIDER_SITE_OTHER): Payer: Self-pay

## 2014-06-22 ENCOUNTER — Ambulatory Visit (INDEPENDENT_AMBULATORY_CARE_PROVIDER_SITE_OTHER): Payer: BLUE CROSS/BLUE SHIELD | Admitting: Cardiovascular Disease

## 2014-06-22 VITALS — BP 134/87 | HR 61 | Ht 61.0 in | Wt 144.8 lb

## 2014-06-22 DIAGNOSIS — R55 Syncope and collapse: Secondary | ICD-10-CM | POA: Diagnosis not present

## 2014-06-22 DIAGNOSIS — R079 Chest pain, unspecified: Secondary | ICD-10-CM

## 2014-06-22 DIAGNOSIS — R002 Palpitations: Secondary | ICD-10-CM | POA: Insufficient documentation

## 2014-06-22 NOTE — Progress Notes (Signed)
Primary care physician: Dr. Ancil Boozer  HPI  This is a 44 year old female who was referred for evaluation of palpitations. The patient has no previous cardiac history. She has known history of asthma, migraine, morbid obesity status post gastric bypass 4 years ago with 100 pound weight loss, previous diabetes and hyperlipidemia and previous tobacco use. She has family history of coronary artery disease. Her sister died at the age of 45 of myocardial infarction. She reports recurrent syncope for many years likely vasovagal. One episode happened while she was getting a tattoo. Over the last 6 months, she has experienced intermittent palpitations described as fast heartbeats associated with feeling flushed and dizzy without syncope. She gets occasional episodes of substernal chest tightness. She had one prolonged episode in December when she was climbing a Systems developer. She reports chronic anxiety.  Allergies  Allergen Reactions  . Other Hives    Ricotta Cheese: flushed and hot  . Vicodin [Hydrocodone-Acetaminophen] Itching     Current Outpatient Prescriptions on File Prior to Visit  Medication Sig Dispense Refill  . albuterol (PROVENTIL HFA;VENTOLIN HFA) 108 (90 BASE) MCG/ACT inhaler Inhale 2 puffs into the lungs every 6 (six) hours as needed for wheezing or shortness of breath.    . Ascorbic Acid (VITAMIN C) 1000 MG tablet Take 1,000 mg by mouth.    . calcium carbonate (OS-CAL) 600 MG TABS tablet Take 600 mg by mouth daily with breakfast.    . Calcium-Vitamin D-Vitamin K (CALCIUM + D + K PO) Take 1 tablet by mouth daily.    . Cholecalciferol (VITAMIN D) 1000 UNITS capsule Take 1,000 Units by mouth daily.    . fluconazole (DIFLUCAN) 150 MG tablet Take 150 mg by mouth once a week.    Marland Kitchen ibuprofen (ADVIL,MOTRIN) 200 MG tablet Take 200 mg by mouth every 4 (four) hours as needed.    . lansoprazole (PREVACID) 30 MG capsule Take by mouth 2 (two) times daily before a meal.     . loratadine (CLARITIN) 10 MG  tablet Take 10 mg by mouth daily.    . mometasone (NASONEX) 50 MCG/ACT nasal spray Place 2 sprays into the nose daily.    . Multiple Vitamin (MULTIVITAMIN) tablet Take 1 tablet by mouth daily.    . ondansetron (ZOFRAN-ODT) 4 MG disintegrating tablet Take 1 tablet by mouth as needed.     . Potassium Aminobenzoate 500 MG CAPS Take 1 capsule by mouth daily.    . SUMAtriptan (IMITREX) 100 MG tablet Take 100 mg by mouth every 2 (two) hours as needed for migraine. May repeat in 2 hours if headache persists or recurs.    Marland Kitchen tiZANidine (ZANAFLEX) 4 MG tablet Take 1 tablet by mouth daily.    Marland Kitchen topiramate (TOPAMAX) 50 MG tablet Take 75 mg by mouth daily.     . vitamin B-12 (CYANOCOBALAMIN) 100 MCG tablet Take 100 mcg by mouth as directed.     . zolpidem (AMBIEN) 10 MG tablet Take 1 tablet by mouth daily.     No current facility-administered medications on file prior to visit.     Past Medical History  Diagnosis Date  . GERD (gastroesophageal reflux disease)   . IBS (irritable bowel syndrome)   . Diabetes mellitus without complication   . Bilateral leg cramps   . Muscle spasm   . Vitamin D deficiency   . Anemia   . Kidney stone   . Seasonal allergies   . Asthma   . DJD (degenerative joint disease)   . Lung  nodules      Past Surgical History  Procedure Laterality Date  . Laparoscopic gastric sleeve resection  2012  . Cholecystectomy  2010  . Bladder surgery  2010  . Plantar fascia surgery Bilateral   . Breast biopsy Right 2014    stereotatic biopsy     Family History  Problem Relation Age of Onset  . Breast cancer Paternal Aunt   . Breast cancer Paternal Grandmother   . Lung cancer Maternal Grandfather   . Heart attack Mother   . Stroke Mother   . Hyperlipidemia Father   . Heart attack Sister   . Heart attack Maternal Uncle   . Heart attack Paternal Grandfather      History   Social History  . Marital Status: Married    Spouse Name: N/A  . Number of Children: N/A  .  Years of Education: N/A   Occupational History  . Not on file.   Social History Main Topics  . Smoking status: Former Smoker -- 1.00 packs/day for 10 years  . Smokeless tobacco: Never Used  . Alcohol Use: No  . Drug Use: No  . Sexual Activity: Not on file   Other Topics Concern  . Not on file   Social History Narrative     ROS A 10 point review of system was performed. It is negative other than that mentioned in the history of present illness.   PHYSICAL EXAM   BP 134/87 mmHg  Pulse 61  Ht 5\' 1"  (1.549 m)  Wt 144 lb 12 oz (65.658 kg)  BMI 27.36 kg/m2 Constitutional: She is oriented to person, place, and time. She appears well-developed and well-nourished. No distress.  HENT: No nasal discharge.  Head: Normocephalic and atraumatic.  Eyes: Pupils are equal and round. No discharge.  Neck: Normal range of motion. Neck supple. No JVD present. No thyromegaly present.  Cardiovascular: Normal rate, regular rhythm, normal heart sounds. Exam reveals no gallop and no friction rub. No murmur heard.  Pulmonary/Chest: Effort normal and breath sounds normal. No stridor. No respiratory distress. She has no wheezes. She has no rales. She exhibits no tenderness.  Abdominal: Soft. Bowel sounds are normal. She exhibits no distension. There is no tenderness. There is no rebound and no guarding.  Musculoskeletal: Normal range of motion. She exhibits no edema and no tenderness.  Neurological: She is alert and oriented to person, place, and time. Coordination normal.  Skin: Skin is warm and dry. No rash noted. She is not diaphoretic. No erythema. No pallor.  Psychiatric: She has a normal mood and affect. Her behavior is normal. Judgment and thought content normal.     CNO:BSJGG  Rhythm  WITHIN NORMAL LIMITS   ASSESSMENT AND PLAN

## 2014-06-22 NOTE — Assessment & Plan Note (Signed)
From her description, this could be due to sinus tachycardia as she reports heart rate in the 130 range. However, given the association with flushing and dizziness, tachycardia arrhythmia cannot be completely excluded. Anxiety might be contributing. I requested a 48-hour Holter monitor. Syncopal episodes are highly suggestive of vasovagal syncope.

## 2014-06-22 NOTE — Assessment & Plan Note (Signed)
She reports a few episodes of substernal chest tightness with activities but this has not been consistent. Cardiac exam is unremarkable and Baseline ECG is normal. I requested a treadmill stress test for evaluation.

## 2014-06-22 NOTE — Patient Instructions (Addendum)
Medication Instructions:  None  Labwork: None  Testing/Procedures: Your physician has requested that you have an exercise tolerance test. Instructions: Nothing to eat or drink 4 hours prior to exercise test.    Your physician has recommended that you wear a 48 hour holter monitor. Holter monitors are medical devices that record the heart's electrical activity. Doctors most often use these monitors to diagnose arrhythmias. Arrhythmias are problems with the speed or rhythm of the heartbeat. The monitor is a small, portable device. You can wear one while you do your normal daily activities. This is usually used to diagnose what is causing palpitations/syncope (passing out).    Follow-Up: Dr. Fletcher Anon as needed  Any Other Special Instructions Will Be Listed Below (If Applicable).

## 2014-06-25 ENCOUNTER — Ambulatory Visit: Payer: BLUE CROSS/BLUE SHIELD

## 2014-06-30 ENCOUNTER — Encounter (INDEPENDENT_AMBULATORY_CARE_PROVIDER_SITE_OTHER): Payer: BLUE CROSS/BLUE SHIELD

## 2014-06-30 DIAGNOSIS — R002 Palpitations: Secondary | ICD-10-CM | POA: Diagnosis not present

## 2014-07-08 ENCOUNTER — Ambulatory Visit (INDEPENDENT_AMBULATORY_CARE_PROVIDER_SITE_OTHER): Payer: BLUE CROSS/BLUE SHIELD

## 2014-07-08 DIAGNOSIS — R079 Chest pain, unspecified: Secondary | ICD-10-CM

## 2014-07-08 LAB — EXERCISE TOLERANCE TEST
CHL CUP STRESS STAGE 1 DBP: 90 mmHg
CHL CUP STRESS STAGE 1 SBP: 120 mmHg
CHL CUP STRESS STAGE 3 HR: 115 {beats}/min
CHL CUP STRESS STAGE 3 SPEED: 1.7 mph
CHL CUP STRESS STAGE 5 GRADE: 14 %
CHL CUP STRESS STAGE 6 GRADE: 0 %
CHL CUP STRESS STAGE 6 SPEED: 0 mph
CHL CUP STRESS STAGE 7 SBP: 131 mmHg
CSEPEW: 10.1 METS
CSEPPMHR: 86 %
Exercise duration (min): 8 min
Exercise duration (sec): 59 s
Peak BP: 164 mmHg
Peak HR: 155 {beats}/min
Rest HR: 81 {beats}/min
Stage 1 Grade: 0 %
Stage 1 HR: 77 {beats}/min
Stage 1 Speed: 0 mph
Stage 2 Grade: 0.1 %
Stage 2 HR: 77 {beats}/min
Stage 2 Speed: 0 mph
Stage 3 DBP: 74 mmHg
Stage 3 Grade: 10 %
Stage 3 SBP: 135 mmHg
Stage 4 DBP: 78 mmHg
Stage 4 Grade: 12 %
Stage 4 HR: 133 {beats}/min
Stage 4 SBP: 161 mmHg
Stage 4 Speed: 2.5 mph
Stage 5 DBP: 82 mmHg
Stage 5 HR: 155 {beats}/min
Stage 5 SBP: 164 mmHg
Stage 5 Speed: 3.4 mph
Stage 6 HR: 125 {beats}/min
Stage 7 DBP: 90 mmHg
Stage 7 Grade: 0 %
Stage 7 HR: 93 {beats}/min
Stage 7 Speed: 0 mph

## 2014-07-28 ENCOUNTER — Telehealth: Payer: Self-pay

## 2014-07-28 NOTE — Telephone Encounter (Signed)
S/w patient regarding monitor results.  Patient verbalized understanding with no further questions.

## 2014-08-19 ENCOUNTER — Other Ambulatory Visit: Payer: Self-pay | Admitting: Family Medicine

## 2014-08-27 ENCOUNTER — Telehealth: Payer: Self-pay | Admitting: Family Medicine

## 2014-08-27 ENCOUNTER — Other Ambulatory Visit: Payer: Self-pay | Admitting: Family Medicine

## 2014-08-27 ENCOUNTER — Telehealth: Payer: Self-pay

## 2014-08-27 ENCOUNTER — Other Ambulatory Visit: Payer: Self-pay

## 2014-08-27 MED ORDER — ZOLPIDEM TARTRATE 10 MG PO TABS: ORAL_TABLET | ORAL | Status: DC

## 2014-08-27 MED ORDER — ZOLPIDEM TARTRATE 10 MG PO TABS: 10.0000 mg | ORAL_TABLET | Freq: Every day | ORAL | Status: DC

## 2014-08-27 NOTE — Telephone Encounter (Signed)
Pt is requesting a refill on her Zolpidem 10mg  ([employer]) sent to her mail order program.  Pt would like a call back once it is complete.

## 2014-08-27 NOTE — Telephone Encounter (Signed)
Done, I will sign and we can fax it on Monday. Thank you

## 2014-08-27 NOTE — Telephone Encounter (Signed)
Patient is requesting the [employer] brand with a 90 day supply go to patient mail order pharmacy, states she is coming in on Friday but always has to put her medication order in 2 weeks before hand due to being mail ordered.

## 2014-09-02 ENCOUNTER — Encounter: Payer: Self-pay | Admitting: Family Medicine

## 2014-09-03 ENCOUNTER — Ambulatory Visit (INDEPENDENT_AMBULATORY_CARE_PROVIDER_SITE_OTHER): Payer: BLUE CROSS/BLUE SHIELD | Admitting: Family Medicine

## 2014-09-03 ENCOUNTER — Encounter: Payer: Self-pay | Admitting: Family Medicine

## 2014-09-03 VITALS — BP 108/64 | HR 76 | Temp 97.8°F | Resp 16 | Ht 61.0 in | Wt 149.0 lb

## 2014-09-03 DIAGNOSIS — G4700 Insomnia, unspecified: Secondary | ICD-10-CM

## 2014-09-03 DIAGNOSIS — J3089 Other allergic rhinitis: Secondary | ICD-10-CM

## 2014-09-03 DIAGNOSIS — D509 Iron deficiency anemia, unspecified: Secondary | ICD-10-CM

## 2014-09-03 DIAGNOSIS — E119 Type 2 diabetes mellitus without complications: Secondary | ICD-10-CM | POA: Insufficient documentation

## 2014-09-03 DIAGNOSIS — Z9884 Bariatric surgery status: Secondary | ICD-10-CM

## 2014-09-03 DIAGNOSIS — E559 Vitamin D deficiency, unspecified: Secondary | ICD-10-CM

## 2014-09-03 DIAGNOSIS — Z87442 Personal history of urinary calculi: Secondary | ICD-10-CM | POA: Insufficient documentation

## 2014-09-03 DIAGNOSIS — F418 Other specified anxiety disorders: Secondary | ICD-10-CM | POA: Insufficient documentation

## 2014-09-03 DIAGNOSIS — R918 Other nonspecific abnormal finding of lung field: Secondary | ICD-10-CM | POA: Insufficient documentation

## 2014-09-03 DIAGNOSIS — F5104 Psychophysiologic insomnia: Secondary | ICD-10-CM

## 2014-09-03 DIAGNOSIS — Z862 Personal history of diseases of the blood and blood-forming organs and certain disorders involving the immune mechanism: Secondary | ICD-10-CM | POA: Insufficient documentation

## 2014-09-03 DIAGNOSIS — K219 Gastro-esophageal reflux disease without esophagitis: Secondary | ICD-10-CM

## 2014-09-03 DIAGNOSIS — J309 Allergic rhinitis, unspecified: Secondary | ICD-10-CM

## 2014-09-03 MED ORDER — DULOXETINE HCL 30 MG PO CPEP: 30.0000 mg | ORAL_CAPSULE | Freq: Every day | ORAL | Status: DC

## 2014-09-03 NOTE — Progress Notes (Signed)
Name: Claire [name]   MRN: [id number]    DOB: [birth year]   Date:09/03/2014       Progress Note  Subjective  Chief Complaint  Chief Complaint  Patient presents with  . Insomnia  . Allergic Rhinitis   . Asthma  . Gastrophageal Reflux  . Anxiety    stress(tension in neck pain) migraines    HPI  Chronic Insomnia: she is taking Ambien, she is able to fall asleep, occasionally wakes up during the night but able to fall back asleep within 10 minutes.  Denies side effects of medication.  AR: taking medication year round and is doing well  Asthma Mild Intermittent: she states she has not taken rescue inhaler for months now, doing well, no chest pain, no wheezing  GERD: under control with medication, she only has symptoms when she skips medication  Depression/Anxiety: she has been very stressed for the past 2 months, but getting worse. Initially was secondary to mother having to place in a nursing home after breaking her clavicle, now having memory loss.  Also having problems with her two sons. The younger one asked his 75 yo sister to touch his penis.  She has been very tense,, crying spells.   Patient Active Problem List   Diagnosis Date Noted  . Perennial allergic rhinitis 09/03/2014  . History of morbid obesity 09/03/2014  . Lung nodules 09/03/2014  . Vitamin D deficiency 09/03/2014  . History of diabetes mellitus 09/03/2014  . History of kidney stones 09/03/2014  . Chronic insomnia 09/03/2014  . Iron deficiency anemia 09/03/2014  . Palpitations 06/22/2014  . History of bariatric surgery 12/03/2013  . Body mass index 27.0-27.9, adult 12/03/2013  . Asthma, mild intermittent 11/21/2012  . CN (constipation) 11/21/2012  . GERD without esophagitis 11/21/2012  . History of hyperlipidemia 11/21/2012  . Migraine with aura without status migrainosus 11/21/2012  . History of sleep apnea 11/21/2012    Past Surgical History  Procedure Laterality Date  . Laparoscopic gastric sleeve  resection  2012  . Cholecystectomy  2010  . Bladder surgery  2010  . Plantar fascia surgery Bilateral   . Breast biopsy Right 2014    stereotatic biopsy    Family History  Problem Relation Age of Onset  . Breast cancer Paternal Aunt   . Breast cancer Paternal Grandmother   . Lung cancer Maternal Grandfather   . Heart attack Mother   . Stroke Mother   . Hyperlipidemia Father   . Heart attack Sister   . Heart attack Maternal Uncle   . Heart attack Paternal Grandfather     History   Social History  . Marital Status: Married    Spouse Name: N/A  . Number of Children: N/A  . Years of Education: N/A   Occupational History  . Not on file.   Social History Main Topics  . Smoking status: Former Smoker -- 1.00 packs/day for 10 years    Types: Cigarettes    Quit date: 02/19/1993  . Smokeless tobacco: Never Used  . Alcohol Use: 0.0 oz/week    0 Standard drinks or equivalent per week     Comment: occasionally  . Drug Use: No  . Sexual Activity: Yes   Other Topics Concern  . Not on file   Social History Narrative     Current outpatient prescriptions:  .  albuterol (PROVENTIL HFA;VENTOLIN HFA) 108 (90 BASE) MCG/ACT inhaler, Inhale 2 puffs into the lungs every 6 (six) hours as needed for wheezing or shortness  of breath., Disp: , Rfl:  .  Ascorbic Acid (VITAMIN C) 1000 MG tablet, Take 1,000 mg by mouth., Disp: , Rfl:  .  baclofen (LIORESAL) 10 MG tablet, Take 1 tablet by mouth daily., Disp: , Rfl: 0 .  calcium carbonate (OS-CAL) 600 MG TABS tablet, Take 600 mg by mouth daily with breakfast., Disp: , Rfl:  .  Calcium-Vitamin D-Vitamin K (CALCIUM + D + K PO), Take 1 tablet by mouth daily., Disp: , Rfl:  .  Cholecalciferol (VITAMIN D) 1000 UNITS capsule, Take 1,000 Units by mouth daily., Disp: , Rfl:  .  fluconazole (DIFLUCAN) 150 MG tablet, Take 150 mg by mouth once a week., Disp: , Rfl:  .  ibuprofen (ADVIL,MOTRIN) 200 MG tablet, Take 200 mg by mouth every 4 (four) hours as  needed., Disp: , Rfl:  .  lansoprazole (PREVACID) 30 MG capsule, Take by mouth 2 (two) times daily before a meal. , Disp: , Rfl:  .  loratadine (CLARITIN) 10 MG tablet, Take 10 mg by mouth daily., Disp: , Rfl:  .  mometasone (NASONEX) 50 MCG/ACT nasal spray, Place 2 sprays into the nose daily., Disp: , Rfl:  .  Multiple Vitamin (MULTIVITAMIN) tablet, Take 1 tablet by mouth daily., Disp: , Rfl:  .  Potassium Aminobenzoate 500 MG CAPS, Take 1 capsule by mouth daily., Disp: , Rfl:  .  promethazine (PHENERGAN) 12.5 MG tablet, TAKE 1 TABLET BY MOUTH EVERY 6 HOURS AS NEEDED FOR MIGRAINES WITH NAUSEA, Disp: 20 tablet, Rfl: 0 .  vitamin B-12 (CYANOCOBALAMIN) 100 MCG tablet, Take 100 mcg by mouth as directed. , Disp: , Rfl:  .  zolpidem (AMBIEN) 10 MG tablet, Must be [employer], Disp: 90 tablet, Rfl: 0 .  zonisamide (ZONEGRAN) 25 MG capsule, Take 1 capsule by mouth 4 (four) times daily -  with meals and at bedtime., Disp: , Rfl: 1  Allergies  Allergen Reactions  . Other Hives    Ricotta Cheese: flushed and hot  . Vicodin [Hydrocodone-Acetaminophen] Itching     ROS  Constitutional: Negative for fever or weight change.  Respiratory: Negative for cough and shortness of breath.   Cardiovascular: Negative for chest pain or palpitations. Seen by cardiologist negative study Gastrointestinal: Negative for abdominal pain, no bowel changes.  Musculoskeletal: Negative for gait problem or joint swelling.  Skin: Negative for rash.  Neurological: Negative for dizziness or headache.  No other specific complaints in a complete review of systems (except as listed in HPI above).  Objective  Filed Vitals:   09/03/14 1552  BP: 108/64  Pulse: 76  Temp: 97.8 F (36.6 C)  TempSrc: Oral  Resp: 16  Height: 5\' 1"  (1.549 m)  Weight: 149 lb (67.586 kg)  SpO2: 99%    Body mass index is 28.17 kg/(m^2).  Physical Exam   Constitutional: Patient appears well-developed and well-nourished.  No distress.   Eyes:  No scleral icterus.  Neck: Normal range of motion. Neck supple. Cardiovascular: Normal rate, regular rhythm and normal heart sounds.  No murmur heard. No BLE edema. Pulmonary/Chest: Effort normal and breath sounds normal. No respiratory distress. Abdominal: Soft.  There is no tenderness. Psychiatric: Patient has a normal mood and affect. behavior is normal. Judgment and thought content normal.  Recent Results (from the past 2160 hour(s))  Exercise Tolerance Test     Status: None   Collection Time: 07/08/14  2:54 PM  Result Value Ref Range   Rest HR 81 bpm   Rest BP 120/90 mmHg  Exercise duration (min) 8 min   Exercise duration (sec) 59 sec   Estimated workload 10.1 METS   Post peak HR 155 BPM   Post peak BP 164 mmHg   MPHR  bpm   Percent HR  %   RPE     Phase 1 name PRETEST    Stage 1 Name SUPINE    Stage 1 Time 00:01:29    Stage 1 Speed 0.0 mph   Stage 1 Grade 0.0 %   Stage 1 HR 77 bpm   Stage 1 SBP 120 mmHg   Stage 1 DBP 90 mmHg   Phase 2 Name Exercise    Stage 2 Name STAGE 1    Stage 2 Attribute Baseline    Stage 2 Time 00:00:01    Stage 2 Speed 0.0 mph   Stage 2 Grade 0.1 %   Stage 2 HR 77 bpm   Phase 3 Name Exercise    Stage 3 Name STAGE 1    Stage 3 Time 00:03:00    Stage 3 Speed 1.7 mph   Stage 3 Grade 10.0 %   Stage 3 HR 115 bpm   Stage 3 SBP 135 mmHg   Stage 3 DBP 74 mmHg   Phase 4 Name Exercise    Stage 4 Name STAGE 2    Stage 4 Time 00:03:00    Stage 4 Speed 2.5 mph   Stage 4 Grade 12.0 %   Stage 4 HR 133 bpm   Stage 4 SBP 161 mmHg   Stage 4 DBP 78 mmHg   Phase 5 Name Exercise    Stage 5 Name STAGE 3    Stage 5 Attribute Peak    Stage 5 Time 00:03:00    Stage 5 Speed 3.4 mph   Stage 5 Grade 14.0 %   Stage 5 HR 155 bpm   Stage 5 SBP 164 mmHg   Stage 5 DBP 82 mmHg   Phase 6 Name Recovery    Stage 6 Attribute Recovery 46min    Stage 6 Time 00:01:01    Stage 6 Speed 0.0 mph   Stage 6 Grade 0.0 %   Stage 6 HR 125 bpm   Phase 7 Name  Recovery    Stage 7 Time 00:11:29    Stage 7 Speed 0.0 mph   Stage 7 Grade 0.0 %   Stage 7 HR 93 bpm   Stage 7 SBP 131 mmHg   Stage 7 DBP 90 mmHg   Percent of predicted max HR 86 %      PHQ2/9: Depression screen PHQ 2/9 09/03/2014  Decreased Interest 0  Down, Depressed, Hopeless 0  PHQ - 2 Score 0     Fall Risk: Fall Risk  09/03/2014  Falls in the past year? No      Assessment & Plan  1. Chronic insomnia  Continue Ambien   2. Perennial allergic rhinitis  Stable with medication   3. Vitamin D deficiency  - Vitamin D (25 hydroxy)  4. Iron deficiency anemia  Recheck labs, only taking MVI, can't tolerate ferrous sulfate - Iron - Ferritin - CBC  5. GERD without esophagitis Controlled with medication   6. History of bariatric surgery Labs done in March  7. Depression with anxiety  Discussed counseling also, start medication, she wants something that is less likely to cause weight gain therefore we will try Cymbalta - DULoxetine (CYMBALTA) 30 MG capsule; Take 1 capsule (30 mg total) by mouth daily. First week after that  2 daily  Dispense: 60 capsule; Refill: 0

## 2014-09-04 LAB — CBC
HEMATOCRIT: 36.1 % (ref 34.0–46.6)
HEMOGLOBIN: 11.4 g/dL (ref 11.1–15.9)
MCH: 24.9 pg — AB (ref 26.6–33.0)
MCHC: 31.6 g/dL (ref 31.5–35.7)
MCV: 79 fL (ref 79–97)
Platelets: 408 10*3/uL — ABNORMAL HIGH (ref 150–379)
RBC: 4.58 x10E6/uL (ref 3.77–5.28)
RDW: 16 % — AB (ref 12.3–15.4)
WBC: 12.3 10*3/uL — ABNORMAL HIGH (ref 3.4–10.8)

## 2014-09-04 LAB — FERRITIN: Ferritin: 10 ng/mL — ABNORMAL LOW (ref 15–150)

## 2014-09-04 LAB — IRON: Iron: 19 ug/dL — ABNORMAL LOW (ref 27–159)

## 2014-09-04 LAB — VITAMIN D 25 HYDROXY (VIT D DEFICIENCY, FRACTURES): Vit D, 25-Hydroxy: 28.1 ng/mL — ABNORMAL LOW (ref 30.0–100.0)

## 2014-09-05 ENCOUNTER — Other Ambulatory Visit: Payer: Self-pay | Admitting: Family Medicine

## 2014-09-05 DIAGNOSIS — D473 Essential (hemorrhagic) thrombocythemia: Secondary | ICD-10-CM | POA: Insufficient documentation

## 2014-09-05 DIAGNOSIS — D72829 Elevated white blood cell count, unspecified: Secondary | ICD-10-CM | POA: Insufficient documentation

## 2014-09-05 DIAGNOSIS — D75839 Thrombocytosis, unspecified: Secondary | ICD-10-CM | POA: Insufficient documentation

## 2014-09-06 NOTE — Progress Notes (Signed)
Patient notified and states she will be out of town in [place] all week but will have CBC rechecked on Monday. 09/13/2014

## 2014-09-15 ENCOUNTER — Other Ambulatory Visit: Payer: Self-pay | Admitting: Family Medicine

## 2014-09-16 LAB — CBC WITH DIFFERENTIAL/PLATELET
BASOS ABS: 0 10*3/uL (ref 0.0–0.2)
BASOS: 0 %
EOS (ABSOLUTE): 0.3 10*3/uL (ref 0.0–0.4)
Eos: 3 %
HEMATOCRIT: 35 % (ref 34.0–46.6)
Hemoglobin: 11.2 g/dL (ref 11.1–15.9)
IMMATURE GRANS (ABS): 0 10*3/uL (ref 0.0–0.1)
Immature Granulocytes: 0 %
Lymphocytes Absolute: 1.9 10*3/uL (ref 0.7–3.1)
Lymphs: 15 %
MCH: 24.7 pg — ABNORMAL LOW (ref 26.6–33.0)
MCHC: 32 g/dL (ref 31.5–35.7)
MCV: 77 fL — AB (ref 79–97)
Monocytes Absolute: 0.9 10*3/uL (ref 0.1–0.9)
Monocytes: 7 %
Neutrophils Absolute: 9.9 10*3/uL — ABNORMAL HIGH (ref 1.4–7.0)
Neutrophils: 75 %
Platelets: 355 10*3/uL (ref 150–379)
RBC: 4.53 x10E6/uL (ref 3.77–5.28)
RDW: 16.4 % — ABNORMAL HIGH (ref 12.3–15.4)
WBC: 13.1 10*3/uL — ABNORMAL HIGH (ref 3.4–10.8)

## 2014-09-21 ENCOUNTER — Telehealth: Payer: Self-pay

## 2014-09-21 NOTE — Telephone Encounter (Signed)
Patient called regarding her lab results on her CBC. Could you please review and send me back what to tell the patient. Thanks.

## 2014-09-22 ENCOUNTER — Telehealth: Payer: Self-pay

## 2014-09-22 NOTE — Telephone Encounter (Signed)
-----   Message from Steele Sizer, MD sent at 09/21/2014  6:04 PM EDT ----- WBC is even higher she needs to be seen within the next week if possible

## 2014-09-22 NOTE — Telephone Encounter (Signed)
Dr. Ancil Boozer reviewed patient CBC and states she needs to come back to be seen soon as she has a opening within the next week if possible. Left on patient vm to call back for a appointment.

## 2014-09-24 ENCOUNTER — Telehealth: Payer: Self-pay | Admitting: Family Medicine

## 2014-09-24 ENCOUNTER — Encounter: Payer: Self-pay | Admitting: Family Medicine

## 2014-09-24 ENCOUNTER — Ambulatory Visit (INDEPENDENT_AMBULATORY_CARE_PROVIDER_SITE_OTHER): Payer: BLUE CROSS/BLUE SHIELD | Admitting: Family Medicine

## 2014-09-24 VITALS — BP 102/70 | HR 76 | Temp 98.2°F | Resp 14 | Ht 61.0 in | Wt 149.5 lb

## 2014-09-24 DIAGNOSIS — F418 Other specified anxiety disorders: Secondary | ICD-10-CM

## 2014-09-24 DIAGNOSIS — D75839 Thrombocytosis, unspecified: Secondary | ICD-10-CM

## 2014-09-24 DIAGNOSIS — D72829 Elevated white blood cell count, unspecified: Secondary | ICD-10-CM

## 2014-09-24 DIAGNOSIS — D473 Essential (hemorrhagic) thrombocythemia: Secondary | ICD-10-CM | POA: Diagnosis not present

## 2014-09-24 MED ORDER — FLUCONAZOLE 150 MG PO TABS: 150.0000 mg | ORAL_TABLET | ORAL | Status: DC

## 2014-09-24 MED ORDER — CITALOPRAM HYDROBROMIDE 20 MG PO TABS: 20.0000 mg | ORAL_TABLET | Freq: Every day | ORAL | Status: DC

## 2014-09-24 MED ORDER — AMOXICILLIN-POT CLAVULANATE 875-125 MG PO TABS: 1.0000 | ORAL_TABLET | Freq: Two times a day (BID) | ORAL | Status: DC

## 2014-09-24 NOTE — Progress Notes (Signed)
Name: Claire [name]   MRN: [id number]    DOB: Sep 20, 1970   Date:09/24/2014       Progress Note  Subjective  Chief Complaint  Chief Complaint  Patient presents with  . Abnormal Lab    Per Dr. Ancil Boozer wanted pt to come in for elevated WBC    HPI  Leukocytosis and Thrombocytosis: she had WBC of 12.1 and went up, no dysuria, change in bowel movement, facial pressure, had a mild cough this am, but nothing before today. No rashes, no change in body aches.  She is having some cramping and headache today from menses, but nothing to justify her elevation of WBC.    Depression/Anxiety: started on Cymbalta and symptoms improved but has noticed palpitation and blurred vision with medication, she would like to change medication  Patient Active Problem List   Diagnosis Date Noted  . Leukocytosis 09/05/2014  . Thrombocytosis 09/05/2014  . Perennial allergic rhinitis 09/03/2014  . History of morbid obesity 09/03/2014  . Lung nodules 09/03/2014  . Vitamin D deficiency 09/03/2014  . History of diabetes mellitus 09/03/2014  . History of kidney stones 09/03/2014  . Chronic insomnia 09/03/2014  . Iron deficiency anemia 09/03/2014  . Depression with anxiety 09/03/2014  . Palpitations 06/22/2014  . History of bariatric surgery 12/03/2013  . Body mass index 27.0-27.9, adult 12/03/2013  . Asthma, mild intermittent 11/21/2012  . CN (constipation) 11/21/2012  . GERD without esophagitis 11/21/2012  . History of hyperlipidemia 11/21/2012  . Migraine with aura without status migrainosus 11/21/2012  . History of sleep apnea 11/21/2012    Past Surgical History  Procedure Laterality Date  . Laparoscopic gastric sleeve resection  2012  . Cholecystectomy  2010  . Bladder surgery  2010  . Plantar fascia surgery Bilateral   . Breast biopsy Right 2014    stereotatic biopsy    Family History  Problem Relation Age of Onset  . Breast cancer Paternal Aunt   . Breast cancer Paternal Grandmother   .  Lung cancer Maternal Grandfather   . Heart attack Mother   . Stroke Mother   . Hyperlipidemia Father   . Heart attack Sister   . Heart attack Maternal Uncle   . Heart attack Paternal Grandfather     History   Social History  . Marital Status: Married    Spouse Name: N/A  . Number of Children: N/A  . Years of Education: N/A   Occupational History  . Not on file.   Social History Main Topics  . Smoking status: Former Smoker -- 1.00 packs/day for 10 years    Types: Cigarettes    Quit date: 02/19/1993  . Smokeless tobacco: Never Used  . Alcohol Use: 0.0 oz/week    0 Standard drinks or equivalent per week     Comment: occasionally  . Drug Use: No  . Sexual Activity: Yes   Other Topics Concern  . Not on file   Social History Narrative     Current outpatient prescriptions:  .  albuterol (PROVENTIL HFA;VENTOLIN HFA) 108 (90 BASE) MCG/ACT inhaler, Inhale 2 puffs into the lungs every 6 (six) hours as needed for wheezing or shortness of breath., Disp: , Rfl:  .  Ascorbic Acid (VITAMIN C) 1000 MG tablet, Take 1,000 mg by mouth., Disp: , Rfl:  .  baclofen (LIORESAL) 10 MG tablet, Take 1 tablet by mouth daily., Disp: , Rfl: 0 .  calcium carbonate (OS-CAL) 600 MG TABS tablet, Take 600 mg by mouth daily with  breakfast., Disp: , Rfl:  .  Calcium-Vitamin D-Vitamin K (CALCIUM + D + K PO), Take 1 tablet by mouth daily., Disp: , Rfl:  .  Cholecalciferol (VITAMIN D) 1000 UNITS capsule, Take 1,000 Units by mouth daily., Disp: , Rfl:  .  ferrous sulfate 325 (65 FE) MG EC tablet, Take 325 mg by mouth 3 (three) times daily with meals., Disp: , Rfl:  .  ibuprofen (ADVIL,MOTRIN) 200 MG tablet, Take 200 mg by mouth every 4 (four) hours as needed., Disp: , Rfl:  .  lansoprazole (PREVACID) 30 MG capsule, Take by mouth 2 (two) times daily before a meal. , Disp: , Rfl:  .  loratadine (CLARITIN) 10 MG tablet, Take 10 mg by mouth daily., Disp: , Rfl:  .  mometasone (NASONEX) 50 MCG/ACT nasal spray,  Place 2 sprays into the nose daily., Disp: , Rfl:  .  Multiple Vitamin (MULTIVITAMIN) tablet, Take 1 tablet by mouth daily., Disp: , Rfl:  .  Potassium Aminobenzoate 500 MG CAPS, Take 1 capsule by mouth daily., Disp: , Rfl:  .  promethazine (PHENERGAN) 12.5 MG tablet, TAKE 1 TABLET BY MOUTH EVERY 6 HOURS AS NEEDED FOR MIGRAINES WITH NAUSEA, Disp: 20 tablet, Rfl: 0 .  vitamin B-12 (CYANOCOBALAMIN) 100 MCG tablet, Take 100 mcg by mouth as directed. , Disp: , Rfl:  .  zolpidem (AMBIEN) 10 MG tablet, Must be [employer], Disp: 90 tablet, Rfl: 0 .  zonisamide (ZONEGRAN) 25 MG capsule, Take 1 capsule by mouth 4 (four) times daily -  with meals and at bedtime., Disp: , Rfl: 1 .  amoxicillin-clavulanate (AUGMENTIN) 875-125 MG per tablet, Take 1 tablet by mouth 2 (two) times daily., Disp: 20 tablet, Rfl: 0 .  citalopram (CELEXA) 20 MG tablet, Take 1 tablet (20 mg total) by mouth daily., Disp: 30 tablet, Rfl: 0 .  fluconazole (DIFLUCAN) 150 MG tablet, Take 1 tablet (150 mg total) by mouth every other day., Disp: 3 tablet, Rfl: 0  Allergies  Allergen Reactions  . Other Hives    Ricotta Cheese: flushed and hot  . Vicodin [Hydrocodone-Acetaminophen] Itching     ROS  Constitutional: Negative for fever or weight change.  Respiratory: mild cough this am and no shortness of breath.   Cardiovascular: Negative for chest pain positive for palpitations since started on Cymbalta.  Gastrointestinal: Negative for abdominal pain, no bowel changes.  Musculoskeletal: Negative for gait problem or joint swelling.  Skin: Negative for rash.  Neurological: Negative for dizziness positive for  Headache from her cycle  No other specific complaints in a complete review of systems (except as listed in HPI above).  Objective  Filed Vitals:   09/24/14 0946  BP: 102/70  Pulse: 76  Temp: 98.2 F (36.8 C)  TempSrc: Oral  Resp: 14  Height: 5\' 1"  (1.549 m)  Weight: 149 lb 8 oz (67.813 kg)  SpO2: 99%    Body  mass index is 28.26 kg/(m^2).  Physical Exam   Constitutional: Patient appears well-developed and well-nourished. No distress.  HENT: Head: Normocephalic and atraumatic. Ears: B TMs ok, no erythema or effusion; Nose: Nose normal. Mouth/Throat: Oropharynx is clear and moist. No oropharyngeal exudate.  Eyes: Conjunctivae and EOM are normal. Pupils are equal, round, and reactive to light. No scleral icterus.  Neck: Normal range of motion. Neck supple. No JVD present. No thyromegaly present.  Cardiovascular: Normal rate, regular rhythm and normal heart sounds.  No murmur heard. No BLE edema. Pulmonary/Chest: Effort normal and breath sounds normal. No  respiratory distress. Abdominal: Soft. Bowel sounds are normal, no distension. There is no tenderness. no masses Musculoskeletal: Normal range of motion, no joint effusions. No gross deformities Neurological: he is alert and oriented to person, place, and time. No cranial nerve deficit. Coordination, balance, strength, speech and gait are normal.  Skin: Skin is warm and dry. No rash noted. No erythema.  Psychiatric: Patient has a normal mood and affect. behavior is normal. Judgment and thought content normal.  Recent Results (from the past 2160 hour(s))  Exercise Tolerance Test     Status: None   Collection Time: 07/08/14  2:54 PM  Result Value Ref Range   Rest HR 81 bpm   Rest BP 120/90 mmHg   Exercise duration (min) 8 min   Exercise duration (sec) 59 sec   Estimated workload 10.1 METS   Post peak HR 155 BPM   Post peak BP 164 mmHg   MPHR  bpm   Percent HR  %   RPE     Phase 1 name PRETEST    Stage 1 Name SUPINE    Stage 1 Time 00:01:29    Stage 1 Speed 0.0 mph   Stage 1 Grade 0.0 %   Stage 1 HR 77 bpm   Stage 1 SBP 120 mmHg   Stage 1 DBP 90 mmHg   Phase 2 Name Exercise    Stage 2 Name STAGE 1    Stage 2 Attribute Baseline    Stage 2 Time 00:00:01    Stage 2 Speed 0.0 mph   Stage 2 Grade 0.1 %   Stage 2 HR 77 bpm   Phase 3 Name  Exercise    Stage 3 Name STAGE 1    Stage 3 Time 00:03:00    Stage 3 Speed 1.7 mph   Stage 3 Grade 10.0 %   Stage 3 HR 115 bpm   Stage 3 SBP 135 mmHg   Stage 3 DBP 74 mmHg   Phase 4 Name Exercise    Stage 4 Name STAGE 2    Stage 4 Time 00:03:00    Stage 4 Speed 2.5 mph   Stage 4 Grade 12.0 %   Stage 4 HR 133 bpm   Stage 4 SBP 161 mmHg   Stage 4 DBP 78 mmHg   Phase 5 Name Exercise    Stage 5 Name STAGE 3    Stage 5 Attribute Peak    Stage 5 Time 00:03:00    Stage 5 Speed 3.4 mph   Stage 5 Grade 14.0 %   Stage 5 HR 155 bpm   Stage 5 SBP 164 mmHg   Stage 5 DBP 82 mmHg   Phase 6 Name Recovery    Stage 6 Attribute Recovery 64min    Stage 6 Time 00:01:01    Stage 6 Speed 0.0 mph   Stage 6 Grade 0.0 %   Stage 6 HR 125 bpm   Phase 7 Name Recovery    Stage 7 Time 00:11:29    Stage 7 Speed 0.0 mph   Stage 7 Grade 0.0 %   Stage 7 HR 93 bpm   Stage 7 SBP 131 mmHg   Stage 7 DBP 90 mmHg   Percent of predicted max HR 86 %  Vitamin D (25 hydroxy)     Status: Abnormal   Collection Time: 09/03/14  4:59 PM  Result Value Ref Range   Vit D, 25-Hydroxy 28.1 (L) 30.0 - 100.0 ng/mL    Comment: Vitamin D deficiency  has been defined by the [place] practice guideline as a level of serum 25-OH vitamin D less than 20 ng/mL (1,2). The Endocrine Society went on to further define vitamin D insufficiency as a level between 21 and 29 ng/mL (2). 1. IOM (Institute of Medicine). 2010. Dietary reference    intakes for calcium and D. [place]: The    [employer]. 2. Holick MF, Binkley Wilmar, Bischoff-Ferrari HA, et al.    Evaluation, treatment, and prevention of vitamin D    deficiency: an Endocrine Society clinical practice    guideline. JCEM. 2011 Jul; 96(7):1911-30.   Iron     Status: Abnormal   Collection Time: 09/03/14  4:59 PM  Result Value Ref Range   Iron 19 (L) 27 - 159 ug/dL  Ferritin     Status: Abnormal   Collection Time: 09/03/14   4:59 PM  Result Value Ref Range   Ferritin 10 (L) 15 - 150 ng/mL  CBC     Status: Abnormal   Collection Time: 09/03/14  4:59 PM  Result Value Ref Range   WBC 12.3 (H) 3.4 - 10.8 x10E3/uL   RBC 4.58 3.77 - 5.28 x10E6/uL   Hemoglobin 11.4 11.1 - 15.9 g/dL   Hematocrit 36.1 34.0 - 46.6 %   MCV 79 79 - 97 fL   MCH 24.9 (L) 26.6 - 33.0 pg   MCHC 31.6 31.5 - 35.7 g/dL   RDW 16.0 (H) 12.3 - 15.4 %   Platelets 408 (H) 150 - 379 x10E3/uL  CBC with Differential/Platelet     Status: Abnormal   Collection Time: 09/15/14  2:59 PM  Result Value Ref Range   WBC 13.1 (H) 3.4 - 10.8 x10E3/uL   RBC 4.53 3.77 - 5.28 x10E6/uL   Hemoglobin 11.2 11.1 - 15.9 g/dL   Hematocrit 35.0 34.0 - 46.6 %   MCV 77 (L) 79 - 97 fL   MCH 24.7 (L) 26.6 - 33.0 pg   MCHC 32.0 31.5 - 35.7 g/dL   RDW 16.4 (H) 12.3 - 15.4 %   Platelets 355 150 - 379 x10E3/uL   Neutrophils 75 %   Lymphs 15 %   Monocytes 7 %   Eos 3 %   Basos 0 %   Neutrophils Absolute 9.9 (H) 1.4 - 7.0 x10E3/uL   Lymphocytes Absolute 1.9 0.7 - 3.1 x10E3/uL   Monocytes Absolute 0.9 0.1 - 0.9 x10E3/uL   EOS (ABSOLUTE) 0.3 0.0 - 0.4 x10E3/uL   Basophils Absolute 0.0 0.0 - 0.2 x10E3/uL   Immature Granulocytes 0 %   Immature Grans (Abs) 0.0 0.0 - 0.1 x10E3/uL    PHQ2/9: Depression screen PHQ 2/9 09/03/2014  Decreased Interest 0  Down, Depressed, Hopeless 0  PHQ - 2 Score 0     Fall Risk: Fall Risk  09/03/2014  Falls in the past year? No     Assessment & Plan  1. Thrombocytosis  - CBC with Differential  2. Leukocytosis We will check urine culture, treat empirically for infection and if no resolution of her elevation of WBC, refer to hematologist/oncologist - Urine Culture - CBC with Differential - fluconazole (DIFLUCAN) 150 MG tablet; Take 1 tablet (150 mg total) by mouth every other day.  Dispense: 3 tablet; Refill: 0  3. Depression with anxiety Change from Cymbalta to Citalopram because of side effects - citalopram (CELEXA) 20 MG  tablet; Take 1 tablet (20 mg total) by mouth daily.  Dispense: 30 tablet; Refill: 0

## 2014-09-24 NOTE — Telephone Encounter (Signed)
Pt has returned your call and needs you to please insure that you get with her today.

## 2014-09-24 NOTE — Telephone Encounter (Signed)
Called prescription into [facility] in [place]. Dr. Ancil Boozer accidentally sent into [facility] which I called and cancelled prescription.

## 2014-09-24 NOTE — Addendum Note (Signed)
Addended by: Steele Sizer F on: 09/24/2014 02:23 PM   Modules accepted: Orders

## 2014-09-24 NOTE — Telephone Encounter (Signed)
Went to [facility] on [address] and her prescriptions where not there. Is it possible to resend the prescriptions. Thank you

## 2014-09-24 NOTE — Telephone Encounter (Signed)
Please send all three medications to [facility] on [address] in [name].

## 2014-09-25 LAB — URINE CULTURE

## 2014-10-05 ENCOUNTER — Ambulatory Visit: Payer: BLUE CROSS/BLUE SHIELD | Admitting: Family Medicine

## 2014-10-13 ENCOUNTER — Telehealth: Payer: Self-pay

## 2014-10-13 LAB — CBC WITH DIFFERENTIAL/PLATELET
BASOS: 1 %
Basophils Absolute: 0 10*3/uL (ref 0.0–0.2)
EOS (ABSOLUTE): 0.4 10*3/uL (ref 0.0–0.4)
EOS: 5 %
HEMATOCRIT: 37.1 % (ref 34.0–46.6)
Hemoglobin: 11.9 g/dL (ref 11.1–15.9)
Immature Grans (Abs): 0 10*3/uL (ref 0.0–0.1)
Immature Granulocytes: 0 %
LYMPHS ABS: 2 10*3/uL (ref 0.7–3.1)
Lymphs: 25 %
MCH: 26.3 pg — ABNORMAL LOW (ref 26.6–33.0)
MCHC: 32.1 g/dL (ref 31.5–35.7)
MCV: 82 fL (ref 79–97)
MONOS ABS: 0.7 10*3/uL (ref 0.1–0.9)
Monocytes: 9 %
Neutrophils Absolute: 4.7 10*3/uL (ref 1.4–7.0)
Neutrophils: 60 %
Platelets: 310 10*3/uL (ref 150–379)
RBC: 4.52 x10E6/uL (ref 3.77–5.28)
RDW: 19 % — ABNORMAL HIGH (ref 12.3–15.4)
WBC: 7.9 10*3/uL (ref 3.4–10.8)

## 2014-10-13 NOTE — Telephone Encounter (Signed)
-----   Message from Steele Sizer, MD sent at 10/13/2014  2:03 PM EDT ----- WBC back to normal

## 2014-10-13 NOTE — Telephone Encounter (Signed)
Left patient a vm to return call for lab results.

## 2014-10-14 NOTE — Progress Notes (Signed)
Patient notified

## 2014-10-23 ENCOUNTER — Other Ambulatory Visit: Payer: Self-pay | Admitting: Family Medicine

## 2014-11-05 ENCOUNTER — Ambulatory Visit (INDEPENDENT_AMBULATORY_CARE_PROVIDER_SITE_OTHER): Payer: BLUE CROSS/BLUE SHIELD | Admitting: Family Medicine

## 2014-11-05 ENCOUNTER — Encounter: Payer: Self-pay | Admitting: Family Medicine

## 2014-11-05 VITALS — BP 112/64 | HR 72 | Temp 98.0°F | Resp 18 | Ht 61.0 in | Wt 153.1 lb

## 2014-11-05 DIAGNOSIS — Z23 Encounter for immunization: Secondary | ICD-10-CM | POA: Diagnosis not present

## 2014-11-05 DIAGNOSIS — L304 Erythema intertrigo: Secondary | ICD-10-CM | POA: Diagnosis not present

## 2014-11-05 DIAGNOSIS — F5104 Psychophysiologic insomnia: Secondary | ICD-10-CM

## 2014-11-05 DIAGNOSIS — F418 Other specified anxiety disorders: Secondary | ICD-10-CM | POA: Diagnosis not present

## 2014-11-05 DIAGNOSIS — G4700 Insomnia, unspecified: Secondary | ICD-10-CM

## 2014-11-05 DIAGNOSIS — Z9884 Bariatric surgery status: Secondary | ICD-10-CM

## 2014-11-05 DIAGNOSIS — K219 Gastro-esophageal reflux disease without esophagitis: Secondary | ICD-10-CM

## 2014-11-05 DIAGNOSIS — M461 Sacroiliitis, not elsewhere classified: Secondary | ICD-10-CM

## 2014-11-05 DIAGNOSIS — J4530 Mild persistent asthma, uncomplicated: Secondary | ICD-10-CM | POA: Diagnosis not present

## 2014-11-05 MED ORDER — ALPRAZOLAM 0.5 MG PO TABS
0.5000 mg | ORAL_TABLET | Freq: Every evening | ORAL | Status: DC | PRN
Start: 2014-11-05 — End: 2014-11-29

## 2014-11-05 MED ORDER — ALBUTEROL SULFATE HFA 108 (90 BASE) MCG/ACT IN AERS: 2.0000 | INHALATION_SPRAY | Freq: Four times a day (QID) | RESPIRATORY_TRACT | Status: DC | PRN

## 2014-11-05 MED ORDER — CITALOPRAM HYDROBROMIDE 40 MG PO TABS: 40.0000 mg | ORAL_TABLET | Freq: Every day | ORAL | Status: DC

## 2014-11-05 MED ORDER — FLUCONAZOLE 150 MG PO TABS: 150.0000 mg | ORAL_TABLET | ORAL | Status: DC

## 2014-11-05 MED ORDER — LANSOPRAZOLE 30 MG PO CPDR: 30.0000 mg | DELAYED_RELEASE_CAPSULE | Freq: Two times a day (BID) | ORAL | Status: DC

## 2014-11-05 MED ORDER — ZOLPIDEM TARTRATE 10 MG PO TABS: ORAL_TABLET | ORAL | Status: DC

## 2014-11-05 MED ORDER — NAPROXEN 500 MG PO TABS: 500.0000 mg | ORAL_TABLET | Freq: Two times a day (BID) | ORAL | Status: DC

## 2014-11-05 NOTE — Progress Notes (Addendum)
Name: Claire [name]   MRN: [id number]    DOB: [birth year]   Date:11/05/2014       Progress Note  Subjective  Chief Complaint  Chief Complaint  Patient presents with  . Medication Management    1 month F/U Depression, 3 month F/U Insomnia  . Depression    Started patient on Celexa, can tell a difference with medication but husband tells his wife she still is a little down at times.   . Insomnia    Patient states she is falling asleep well but is waking up around 2 or 3 a.m. and finds her mind is racing and is having trouble falling back asleep.    HPI    Depression: she is on Celexa 20mg  for the past 5 weeks, she has noticed an improvement in her mood, still crying but less frequent, she has slightly improvement on anhedonia. Appetite is back to normal. Taking medication as prescribed and denies side effects  Insomnia: taking medication still wakes up at 2 to 3 am and has difficulty falling back asleep, because of depression/anxiety.   GERD: she needs refill of Prevaci it has to [employer] brand or it does not work for her.   Asthma Mild Persistent: wheezing a couple times a week during the Summer months, humidity and the heat makes symptoms worse   11/05/14 1550  Asthma History  Symptoms 0-2 days/week  Nighttime Awakenings 0-2/month  Asthma interference with normal activity Minor limitations  SABA use (not for EIB) > 2 days/wk--not > 1 x/day  Risk: Exacerbations requiring oral systemic steroids 0-1 / year  Asthma Severity Mild Persistent   Sacroiliitis: she noticed right lower back pain and also buttocks pain, over the past 4 days, working up to 14 hours per day, sitting. Pain is described as a aching pain, but sharp with activity, aching and very painful when sitting for a prolonged period of time.  No numbness or tingling  Intertrigo: under abdominal fold, rash that is itchy, taking fluconazole prn   Patient Active Problem List   Diagnosis Date Noted  . Intertrigo 11/05/2014   . Perennial allergic rhinitis 09/03/2014  . History of morbid obesity 09/03/2014  . Lung nodules 09/03/2014  . Vitamin D deficiency 09/03/2014  . History of diabetes mellitus 09/03/2014  . History of kidney stones 09/03/2014  . Chronic insomnia 09/03/2014  . History of iron deficiency anemia 09/03/2014  . Depression with anxiety 09/03/2014  . Palpitations 06/22/2014  . History of bariatric surgery 12/03/2013  . Body mass index 27.0-27.9, adult 12/03/2013  . Asthma, mild persistent 11/21/2012  . CN (constipation) 11/21/2012  . GERD without esophagitis 11/21/2012  . History of hyperlipidemia 11/21/2012  . Migraine with aura without status migrainosus 11/21/2012  . History of sleep apnea 11/21/2012    Past Surgical History  Procedure Laterality Date  . Laparoscopic gastric sleeve resection  2012  . Cholecystectomy  2010  . Bladder surgery  2010  . Plantar fascia surgery Bilateral   . Breast biopsy Right 2014    stereotatic biopsy    Family History  Problem Relation Age of Onset  . Breast cancer Paternal Aunt   . Breast cancer Paternal Grandmother   . Lung cancer Maternal Grandfather   . Heart attack Mother   . Stroke Mother   . Hyperlipidemia Father   . Heart attack Sister   . Heart attack Maternal Uncle   . Heart attack Paternal Grandfather     Social History   Social History  .  Marital Status: Married    Spouse Name: N/A  . Number of Children: N/A  . Years of Education: N/A   Occupational History  . Not on file.   Social History Main Topics  . Smoking status: Former Smoker -- 1.00 packs/day for 10 years    Types: Cigarettes    Quit date: 02/19/1993  . Smokeless tobacco: Never Used  . Alcohol Use: 0.0 oz/week    0 Standard drinks or equivalent per week     Comment: occasionally  . Drug Use: No  . Sexual Activity:    Partners: Male   Other Topics Concern  . Not on file   Social History Narrative     Current outpatient prescriptions:  .   albuterol (PROVENTIL HFA;VENTOLIN HFA) 108 (90 BASE) MCG/ACT inhaler, Inhale 2 puffs into the lungs every 6 (six) hours as needed for wheezing or shortness of breath., Disp: 3 Inhaler, Rfl: 0 .  Ascorbic Acid (VITAMIN C) 1000 MG tablet, Take 1,000 mg by mouth., Disp: , Rfl:  .  baclofen (LIORESAL) 10 MG tablet, Take 1 tablet by mouth daily., Disp: , Rfl: 0 .  calcium carbonate (OS-CAL) 600 MG TABS tablet, Take 600 mg by mouth daily with breakfast., Disp: , Rfl:  .  Cholecalciferol (VITAMIN D) 1000 UNITS capsule, Take 1,000 Units by mouth daily., Disp: , Rfl:  .  citalopram (CELEXA) 40 MG tablet, Take 1 tablet (40 mg total) by mouth daily. 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  ferrous sulfate 325 (65 FE) MG EC tablet, Take 325 mg by mouth 3 (three) times daily with meals., Disp: , Rfl:  .  fluconazole (DIFLUCAN) 150 MG tablet, Take 1 tablet (150 mg total) by mouth 3 (three) times a week., Disp: 12 tablet, Rfl: 1 .  ibuprofen (ADVIL,MOTRIN) 200 MG tablet, Take 200 mg by mouth every 4 (four) hours as needed., Disp: , Rfl:  .  lansoprazole (PREVACID) 30 MG capsule, Take 1 capsule (30 mg total) by mouth 2 (two) times daily before a meal., Disp: 180 capsule, Rfl: 1 .  loratadine (CLARITIN) 10 MG tablet, Take 10 mg by mouth daily., Disp: , Rfl:  .  mometasone (NASONEX) 50 MCG/ACT nasal spray, Place 2 sprays into the nose daily., Disp: , Rfl:  .  Multiple Vitamin (MULTIVITAMIN) tablet, Take 1 tablet by mouth daily., Disp: , Rfl:  .  Potassium Aminobenzoate 500 MG CAPS, Take 1 capsule by mouth daily., Disp: , Rfl:  .  promethazine (PHENERGAN) 12.5 MG tablet, TAKE 1 TABLET BY MOUTH EVERY 6 HOURS AS NEEDED FOR MIGRAINES WITH NAUSEA, Disp: 20 tablet, Rfl: 0 .  vitamin B-12 (CYANOCOBALAMIN) 100 MCG tablet, Take 100 mcg by mouth as directed. , Disp: , Rfl:  .  zolpidem (AMBIEN) 10 MG tablet, Must be [employer], Disp: 90 tablet, Rfl: 0 .  zonisamide (ZONEGRAN) 25 MG capsule, Take 1 capsule by  mouth 4 (four) times daily -  with meals and at bedtime., Disp: , Rfl: 1  Allergies  Allergen Reactions  . Other Hives    Ricotta Cheese: flushed and hot  . Vicodin [Hydrocodone-Acetaminophen] Itching  . Cymbalta [Duloxetine Hcl] Palpitations    And photosensitivity     ROS  Constitutional: Negative for fever or significant  weight change.  Respiratory: Negative for cough and shortness of breath.   Cardiovascular: Negative for chest pain or palpitations.  Gastrointestinal: Negative for abdominal pain, no bowel changes.  Musculoskeletal: Negative for gait problem or joint swelling.  Skin: Positive  for rash.  Neurological: Negative for dizziness or headache.  No other specific complaints in a complete review of systems (except as listed in HPI above).  Objective  Filed Vitals:   11/05/14 1527  BP: 112/64  Pulse: 72  Temp: 98 F (36.7 C)  TempSrc: Oral  Resp: 18  Height: 5\' 1"  (1.549 m)  Weight: 153 lb 1.6 oz (69.446 kg)  SpO2: 98%    Body mass index is 28.94 kg/(m^2).  Physical Exam  Constitutional: Patient appears well-developed and well-nourished. Obese No distress.  HEENT: head atraumatic, normocephalic, pupils equal and reactive to light,neck supple, throat within normal limits Cardiovascular: Normal rate, regular rhythm and normal heart sounds.  No murmur heard. No BLE edema. Pulmonary/Chest: Effort normal and breath sounds normal. No respiratory distress. Abdominal: Soft.  There is no tenderness. Psychiatric: Patient has a normal mood and affect. behavior is normal. Judgment and thought content normal. Muscular Skeletal: pain during palpation of right sacroiliac joint, also has right buttocks pain, negative straight leg raise  Recent Results (from the past 2160 hour(s))  Vitamin D (25 hydroxy)     Status: Abnormal   Collection Time: 09/03/14  4:59 PM  Result Value Ref Range   Vit D, 25-Hydroxy 28.1 (L) 30.0 - 100.0 ng/mL    Comment: Vitamin D deficiency has  been defined by the [place] and an Endocrine Society practice guideline as a level of serum 25-OH vitamin D less than 20 ng/mL (1,2). The Endocrine Society went on to further define vitamin D insufficiency as a level between 21 and 29 ng/mL (2). 1. IOM (Institute of Medicine). 2010. Dietary reference    intakes for calcium and D. [place]: The    [employer]. 2. Holick MF, Binkley [place], Bischoff-Ferrari HA, et al.    Evaluation, treatment, and prevention of vitamin D    deficiency: an Endocrine Society clinical practice    guideline. JCEM. 2011 Jul; 96(7):1911-30.   Iron     Status: Abnormal   Collection Time: 09/03/14  4:59 PM  Result Value Ref Range   Iron 19 (L) 27 - 159 ug/dL  Ferritin     Status: Abnormal   Collection Time: 09/03/14  4:59 PM  Result Value Ref Range   Ferritin 10 (L) 15 - 150 ng/mL  CBC     Status: Abnormal   Collection Time: 09/03/14  4:59 PM  Result Value Ref Range   WBC 12.3 (H) 3.4 - 10.8 x10E3/uL   RBC 4.58 3.77 - 5.28 x10E6/uL   Hemoglobin 11.4 11.1 - 15.9 g/dL   Hematocrit 36.1 34.0 - 46.6 %   MCV 79 79 - 97 fL   MCH 24.9 (L) 26.6 - 33.0 pg   MCHC 31.6 31.5 - 35.7 g/dL   RDW 16.0 (H) 12.3 - 15.4 %   Platelets 408 (H) 150 - 379 x10E3/uL  CBC with Differential/Platelet     Status: Abnormal   Collection Time: 09/15/14  2:59 PM  Result Value Ref Range   WBC 13.1 (H) 3.4 - 10.8 x10E3/uL   RBC 4.53 3.77 - 5.28 x10E6/uL   Hemoglobin 11.2 11.1 - 15.9 g/dL   Hematocrit 35.0 34.0 - 46.6 %   MCV 77 (L) 79 - 97 fL   MCH 24.7 (L) 26.6 - 33.0 pg   MCHC 32.0 31.5 - 35.7 g/dL   RDW 16.4 (H) 12.3 - 15.4 %   Platelets 355 150 - 379 x10E3/uL   Neutrophils 75 %  Lymphs 15 %   Monocytes 7 %   Eos 3 %   Basos 0 %   Neutrophils Absolute 9.9 (H) 1.4 - 7.0 x10E3/uL   Lymphocytes Absolute 1.9 0.7 - 3.1 x10E3/uL   Monocytes Absolute 0.9 0.1 - 0.9 x10E3/uL   EOS (ABSOLUTE) 0.3 0.0 - 0.4 x10E3/uL   Basophils Absolute 0.0 0.0 - 0.2  x10E3/uL   Immature Granulocytes 0 %   Immature Grans (Abs) 0.0 0.0 - 0.1 x10E3/uL  Urine Culture     Status: None   Collection Time: 09/24/14 12:00 AM  Result Value Ref Range   Urine Culture, Routine Final report    Urine Culture result 1 Comment     Comment: Mixed urogenital flora Less than 10,000 colonies/mL   CBC with Differential     Status: Abnormal   Collection Time: 10/12/14  4:36 PM  Result Value Ref Range   WBC 7.9 3.4 - 10.8 x10E3/uL   RBC 4.52 3.77 - 5.28 x10E6/uL   Hemoglobin 11.9 11.1 - 15.9 g/dL   Hematocrit 37.1 34.0 - 46.6 %   MCV 82 79 - 97 fL   MCH 26.3 (L) 26.6 - 33.0 pg   MCHC 32.1 31.5 - 35.7 g/dL   RDW 19.0 (H) 12.3 - 15.4 %   Platelets 310 150 - 379 x10E3/uL   Neutrophils 60 %   Lymphs 25 %   Monocytes 9 %   Eos 5 %   Basos 1 %   Neutrophils Absolute 4.7 1.4 - 7.0 x10E3/uL   Lymphocytes Absolute 2.0 0.7 - 3.1 x10E3/uL   Monocytes Absolute 0.7 0.1 - 0.9 x10E3/uL   EOS (ABSOLUTE) 0.4 0.0 - 0.4 x10E3/uL   Basophils Absolute 0.0 0.0 - 0.2 x10E3/uL   Immature Granulocytes 0 %   Immature Grans (Abs) 0.0 0.0 - 0.1 x10E3/uL     PHQ2/9: Depression screen [facility] 2/9 11/05/2014 09/03/2014  Decreased Interest 1 0  Down, Depressed, Hopeless 1 0  PHQ - 2 Score 2 0  Altered sleeping 3 -  Tired, decreased energy 3 -  Change in appetite 2 -  Feeling bad or failure about yourself  0 -  Trouble concentrating 3 -  Moving slowly or fidgety/restless 1 -  Suicidal thoughts 0 -  PHQ-9 Score 14 -  Difficult doing work/chores Not difficult at all -     Fall Risk: Fall Risk  11/05/2014 09/03/2014  Falls in the past year? No No      Functional Status Survey: Is the patient deaf or have difficulty hearing?: No Does the patient have difficulty seeing, even when wearing glasses/contacts?: No Does the patient have difficulty concentrating, remembering, or making decisions?: No Does the patient have difficulty walking or climbing stairs?: No Does the patient have  difficulty dressing or bathing?: No Does the patient have difficulty doing errands alone such as visiting a doctor's office or shopping?: No    Assessment & Plan  1. Chronic insomnia  We will try Alprazolam prn  - zolpidem (AMBIEN) 10 MG tablet; Must be [employer]  Dispense: 90 tablet; Refill: 0  2. Needs flu shot  - Flu Vaccine QUAD 36+ mos PF IM (Fluarix & Fluzone Quad PF)  3. History of bariatric surgery stable  4. Depression with anxiety We will increase dose - citalopram (CELEXA) 40 MG tablet; Take 1 tablet (40 mg total) by mouth daily. 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 Intertrigo  - fluconazole (DIFLUCAN) 150 MG tablet; Take 1  tablet (150 mg total) by mouth 3 (three) times a week.  Dispense: 12 tablet; Refill: 1  6. Asthma, mild persistent, uncomplicated  - albuterol (PROVENTIL HFA;VENTOLIN HFA) 108 (90 BASE) MCG/ACT inhaler; Inhale 2 puffs into the lungs every 6 (six) hours as needed for wheezing or shortness of breath.  Dispense: 3 Inhaler; Refill: 0  7. GERD without esophagitis  - lansoprazole (PREVACID) 30 MG capsule; Take 1 capsule (30 mg total) by mouth 2 (two) times daily before a meal.  Dispense: 180 capsule; Refill: 1

## 2014-11-15 ENCOUNTER — Telehealth: Payer: Self-pay | Admitting: Family Medicine

## 2014-11-15 NOTE — Telephone Encounter (Signed)
She needs to take alprazolam to calm down, not double the citalopram

## 2014-11-15 NOTE — Telephone Encounter (Signed)
MOTHER PASSED THIS SAT AND WANTS TO KNOW IF THE MEDS (THE ONE FOR STRESS AND ANXIETY 40 MG) THAT SHE IS ON IF IT WOULD HELP CALM HER DOWN OR IS SHE GOING TO NEED SOMEHITNG ELSE FOR HER TO TAKE. THINKS THAT SHE IS IN SHOCK RIGHT NOW. [PHAMR  IS [place] OFF [place]

## 2014-11-29 ENCOUNTER — Other Ambulatory Visit: Payer: Self-pay | Admitting: Family Medicine

## 2014-11-30 ENCOUNTER — Other Ambulatory Visit: Payer: Self-pay | Admitting: Family Medicine

## 2014-11-30 NOTE — Telephone Encounter (Signed)
Patient requesting refill. 

## 2014-12-03 ENCOUNTER — Other Ambulatory Visit: Payer: Self-pay | Admitting: Family Medicine

## 2014-12-17 ENCOUNTER — Other Ambulatory Visit: Payer: Self-pay

## 2014-12-17 ENCOUNTER — Encounter: Payer: Self-pay | Admitting: Family Medicine

## 2014-12-17 ENCOUNTER — Ambulatory Visit (INDEPENDENT_AMBULATORY_CARE_PROVIDER_SITE_OTHER): Payer: BLUE CROSS/BLUE SHIELD | Admitting: Family Medicine

## 2014-12-17 VITALS — BP 116/64 | HR 86 | Temp 97.7°F | Resp 16 | Ht 61.0 in | Wt 159.9 lb

## 2014-12-17 DIAGNOSIS — F418 Other specified anxiety disorders: Secondary | ICD-10-CM

## 2014-12-17 DIAGNOSIS — N750 Cyst of Bartholin's gland: Secondary | ICD-10-CM | POA: Diagnosis not present

## 2014-12-17 MED ORDER — PROMETHAZINE HCL 12.5 MG PO TABS: ORAL_TABLET | ORAL | Status: DC

## 2014-12-17 NOTE — Telephone Encounter (Signed)
Patient requesting refill. 

## 2014-12-17 NOTE — Progress Notes (Signed)
Name: Claire [name]   MRN: [id number]    DOB: 03-02-1970   Date:12/17/2014       Progress Note  Subjective  Chief Complaint  Chief Complaint  Patient presents with  . Medication Management    6 week F/U  . Depression    Increased Citalopram last visit, doing well.  . Recurrent Skin Infections    Onset-Saturday morning on inside of labia, Increasing in size and talked to GYN and was informed to taking Naproxen and soak in hot bath.    HPI  Depression/Anxiety: she states since dose of Citalopram was increased to 40 mg 6 weeks ago she has been feeling better.  She states her mother died recently but she is coping well.  She states she has noticed improvement in her level of energy and motivation. A few spells since her mother died one month ago otherwise feeling well  Cyst: she states lump on right side of vagina/labia past 6 days, using sitz bath and Naproxen and size has decreased, bleed a little this am, feeling better, no fever.    Patient Active Problem List   Diagnosis Date Noted  . Intertrigo 11/05/2014  . Perennial allergic rhinitis 09/03/2014  . History of morbid obesity 09/03/2014  . Lung nodules 09/03/2014  . Vitamin D deficiency 09/03/2014  . History of diabetes mellitus 09/03/2014  . History of kidney stones 09/03/2014  . Chronic insomnia 09/03/2014  . History of iron deficiency anemia 09/03/2014  . Depression with anxiety 09/03/2014  . Palpitations 06/22/2014  . History of bariatric surgery 12/03/2013  . Body mass index 27.0-27.9, adult 12/03/2013  . Asthma, mild persistent 11/21/2012  . CN (constipation) 11/21/2012  . GERD without esophagitis 11/21/2012  . History of hyperlipidemia 11/21/2012  . Migraine with aura without status migrainosus 11/21/2012  . History of sleep apnea 11/21/2012    Past Surgical History  Procedure Laterality Date  . Laparoscopic gastric sleeve resection  2012  . Cholecystectomy  2010  . Bladder surgery  2010  . Plantar  fascia surgery Bilateral   . Breast biopsy Right 2014    stereotatic biopsy    Family History  Problem Relation Age of Onset  . Breast cancer Paternal Aunt   . Breast cancer Paternal Grandmother   . Lung cancer Maternal Grandfather   . Heart attack Mother   . Stroke Mother   . Multiple myeloma Mother   . Hyperlipidemia Father   . Heart attack Sister   . Heart attack Maternal Uncle   . Heart attack Paternal Grandfather     Social History   Social History  . Marital Status: Married    Spouse Name: N/A  . Number of Children: N/A  . Years of Education: N/A   Occupational History  . Not on file.   Social History Main Topics  . Smoking status: Former Smoker -- 1.00 packs/day for 10 years    Types: Cigarettes    Quit date: 02/19/1993  . Smokeless tobacco: Never Used  . Alcohol Use: 0.0 oz/week    0 Standard drinks or equivalent per week     Comment: occasionally  . Drug Use: No  . Sexual Activity:    Partners: Male   Other Topics Concern  . Not on file   Social History Narrative     Current outpatient prescriptions:  .  albuterol (PROVENTIL HFA;VENTOLIN HFA) 108 (90 BASE) MCG/ACT inhaler, Inhale 2 puffs into the lungs every 6 (six) hours as needed for wheezing or shortness  of breath., Disp: 3 Inhaler, Rfl: 0 .  ALPRAZolam (XANAX) 0.5 MG tablet, TAKE 1 TABLET BY MOUTH AT BEDTIME AS NEEDED FOR ANXIETY, Disp: 30 tablet, Rfl: 0 .  Ascorbic Acid (VITAMIN C) 1000 MG tablet, Take 1,000 mg by mouth., Disp: , Rfl:  .  calcium carbonate (OS-CAL) 600 MG TABS tablet, Take 600 mg by mouth daily with breakfast., Disp: , Rfl:  .  Cholecalciferol (VITAMIN D) 1000 UNITS capsule, Take 1,000 Units by mouth daily., Disp: , Rfl:  .  citalopram (CELEXA) 40 MG tablet, Take 1 tablet (40 mg total) by mouth daily. 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  cyclobenzaprine (FLEXERIL) 10 MG tablet, , Disp: , Rfl: 0 .  ferrous sulfate 325 (65 FE) MG EC tablet, Take 325 mg by mouth 3  (three) times daily with meals., Disp: , Rfl:  .  fluconazole (DIFLUCAN) 150 MG tablet, Take 1 tablet (150 mg total) by mouth 3 (three) times a week., Disp: 12 tablet, Rfl: 1 .  ibuprofen (ADVIL,MOTRIN) 200 MG tablet, Take 200 mg by mouth every 4 (four) hours as needed., Disp: , Rfl:  .  lansoprazole (PREVACID) 30 MG capsule, Take 1 capsule (30 mg total) by mouth 2 (two) times daily before a meal., Disp: 180 capsule, Rfl: 1 .  loratadine (CLARITIN) 10 MG tablet, Take 10 mg by mouth daily., Disp: , Rfl:  .  mometasone (NASONEX) 50 MCG/ACT nasal spray, USE 2 SPRAYS IN EACH NOSTRIL AT BEDTIME, Disp: 51 g, Rfl: 1 .  Multiple Vitamin (MULTIVITAMIN) tablet, Take 1 tablet by mouth daily., Disp: , Rfl:  .  naproxen (NAPROSYN) 500 MG tablet, Take 1 tablet (500 mg total) by mouth 2 (two) times daily with a meal., Disp: 60 tablet, Rfl: 0 .  Potassium Aminobenzoate 500 MG CAPS, Take 1 capsule by mouth daily., Disp: , Rfl:  .  promethazine (PHENERGAN) 12.5 MG tablet, TAKE 1 TABLET BY MOUTH EVERY 6 HOURS AS NEEDED FOR MIGRAINES WITH NAUSEA, Disp: 20 tablet, Rfl: 0 .  vitamin B-12 (CYANOCOBALAMIN) 100 MCG tablet, Take 100 mcg by mouth as directed. , Disp: , Rfl:  .  zolpidem (AMBIEN) 10 MG tablet, Must be [employer], Disp: 90 tablet, Rfl: 0 .  zonisamide (ZONEGRAN) 100 MG capsule, TK 1 C PO D, Disp: , Rfl: 1  Allergies  Allergen Reactions  . Other Hives    Ricotta Cheese: flushed and hot  . Vicodin [Hydrocodone-Acetaminophen] Itching  . Cymbalta [Duloxetine Hcl] Palpitations    And photosensitivity     ROS  Ten systems reviewed and is negative except as mentioned in HPI   Objective  Filed Vitals:   12/17/14 1530  BP: 116/64  Pulse: 86  Temp: 97.7 F (36.5 C)  TempSrc: Oral  Resp: 16  Height: 5' 1"  (1.549 m)  Weight: 159 lb 14.4 oz (72.53 kg)  SpO2: 98%    Body mass index is 30.23 kg/(m^2).  Physical Exam  Constitutional: Patient appears well-developed and well-nourished. Obese No  distress.  HEENT: head atraumatic, normocephalic, pupils equal and reactive to light, , neck supple, throat within normal limits Cardiovascular: Normal rate, regular rhythm and normal heart sounds.  No murmur heard. No BLE edema. Pulmonary/Chest: Effort normal and breath sounds normal. No respiratory distress. Abdominal: Soft.  There is no tenderness. Psychiatric: Patient has a normal mood and affect. behavior is normal. Judgment and thought content normal. GYN: bartholin cyst on right side, soft, mildly tender  Recent Results (from the past  2160 hour(s))  Urine Culture     Status: None   Collection Time: 09/24/14 12:00 AM  Result Value Ref Range   Urine Culture, Routine Final report    Urine Culture result 1 Comment     Comment: Mixed urogenital flora Less than 10,000 colonies/mL   CBC with Differential     Status: Abnormal   Collection Time: 10/12/14  4:36 PM  Result Value Ref Range   WBC 7.9 3.4 - 10.8 x10E3/uL   RBC 4.52 3.77 - 5.28 x10E6/uL   Hemoglobin 11.9 11.1 - 15.9 g/dL   Hematocrit 37.1 34.0 - 46.6 %   MCV 82 79 - 97 fL   MCH 26.3 (L) 26.6 - 33.0 pg   MCHC 32.1 31.5 - 35.7 g/dL   RDW 19.0 (H) 12.3 - 15.4 %   Platelets 310 150 - 379 x10E3/uL   Neutrophils 60 %   Lymphs 25 %   Monocytes 9 %   Eos 5 %   Basos 1 %   Neutrophils Absolute 4.7 1.4 - 7.0 x10E3/uL   Lymphocytes Absolute 2.0 0.7 - 3.1 x10E3/uL   Monocytes Absolute 0.7 0.1 - 0.9 x10E3/uL   EOS (ABSOLUTE) 0.4 0.0 - 0.4 x10E3/uL   Basophils Absolute 0.0 0.0 - 0.2 x10E3/uL   Immature Granulocytes 0 %   Immature Grans (Abs) 0.0 0.0 - 0.1 x10E3/uL    PHQ2/9: Depression screen [facility] 2/9 11/05/2014 09/03/2014  Decreased Interest 1 0  Down, Depressed, Hopeless 1 0  PHQ - 2 Score 2 0  Altered sleeping 3 -  Tired, decreased energy 3 -  Change in appetite 2 -  Feeling bad or failure about yourself  0 -  Trouble concentrating 3 -  Moving slowly or fidgety/restless 1 -  Suicidal thoughts 0 -  PHQ-9 Score 14 -   Difficult doing work/chores Not difficult at all -   Fall Risk: Fall Risk  11/05/2014 09/03/2014  Falls in the past year? No No     Assessment & Plan  1. Bartholin's cyst  Continue sitz bath, and if no resolution follow up with GYN  2. Depression with anxiety  Continue medication , doing well

## 2015-01-06 ENCOUNTER — Telehealth: Payer: Self-pay | Admitting: Family Medicine

## 2015-01-06 NOTE — Telephone Encounter (Signed)
Errenous °

## 2015-01-07 ENCOUNTER — Telehealth: Payer: Self-pay | Admitting: Family Medicine

## 2015-01-07 ENCOUNTER — Ambulatory Visit
Admission: RE | Admit: 2015-01-07 | Discharge: 2015-01-07 | Disposition: A | Discharge status: Home / Self Care | Payer: BLUE CROSS/BLUE SHIELD | Source: Ambulatory Visit | Attending: Family Medicine | Admitting: Family Medicine

## 2015-01-07 ENCOUNTER — Encounter: Payer: Self-pay | Admitting: Family Medicine

## 2015-01-07 ENCOUNTER — Ambulatory Visit (INDEPENDENT_AMBULATORY_CARE_PROVIDER_SITE_OTHER): Payer: BLUE CROSS/BLUE SHIELD | Admitting: Family Medicine

## 2015-01-07 VITALS — BP 116/78 | HR 85 | Temp 98.3°F | Resp 16 | Ht 61.0 in | Wt 161.4 lb

## 2015-01-07 DIAGNOSIS — M545 Low back pain, unspecified: Secondary | ICD-10-CM

## 2015-01-07 DIAGNOSIS — M5489 Other dorsalgia: Secondary | ICD-10-CM

## 2015-01-07 DIAGNOSIS — R258 Other abnormal involuntary movements: Secondary | ICD-10-CM | POA: Diagnosis not present

## 2015-01-07 DIAGNOSIS — R253 Fasciculation: Secondary | ICD-10-CM | POA: Insufficient documentation

## 2015-01-07 MED ORDER — TRAMADOL HCL 50 MG PO TABS: 50.0000 mg | ORAL_TABLET | Freq: Two times a day (BID) | ORAL | Status: DC | PRN

## 2015-01-07 MED ORDER — BACLOFEN 10 MG PO TABS: 10.0000 mg | ORAL_TABLET | Freq: Three times a day (TID) | ORAL | Status: DC | PRN

## 2015-01-07 NOTE — Telephone Encounter (Signed)
Pt called you back and states that you can call her back at home at [phone]

## 2015-01-07 NOTE — Patient Instructions (Signed)
Tailbone Injury °The tailbone (coccyx) is the small bone at the lower end of the spine. A tailbone injury may involve stretched ligaments, bruising, or a broken bone (fracture). Tailbone injuries can be painful, and some may take a long time to heal. °CAUSES °This condition is often caused by falling and landing on the tailbone. Other causes include: °· Repeated strain or friction from actions such as rowing and bicycling. °· Childbirth. °In some cases, the cause may not be known. °RISK FACTORS °This condition is more common in women than in men. °SYMPTOMS °Symptoms of this condition include: °· Pain in the lower back, especially when sitting. °· Pain or difficulty when standing up from a sitting position. °· Bruising in the tailbone area. °· Painful bowel movements. °· In women, pain during intercourse. °DIAGNOSIS °This condition may be diagnosed based on your symptoms and a physical exam. X-rays may be taken if a fracture is suspected. You may also have other tests, such as a CT scan or MRI. °TREATMENT °This condition may be treated with medicines to help relieve your pain. Most tailbone injuries heal on their own in 4-6 weeks. However, recovery time may be longer if the injury involves a fracture. °HOME CARE INSTRUCTIONS °· Take medicines only as directed by your health care provider. °· If directed, apply ice to the injured area: °¨ Put ice in a plastic bag. °¨ Place a towel between your skin and the bag. °¨ Leave the ice on for 20 minutes, 2-3 times per day for the first 1-2 days. °· Sit on a large, rubber or inflated ring or cushion to ease your pain. Lean forward when you are sitting to help decrease discomfort. °· Avoid sitting for long periods of time. °· Increase your activity as the pain allows. Perform any exercises that are recommended by your health care provider or physical therapist. °· If you have pain during bowel movements, use stool softeners as directed by your health care provider. °· Eat a  diet that includes plenty of fiber to help prevent constipation. °· Keep all follow-up visits as directed by your health care provider. This is important. °PREVENTION °Wear appropriate padding and sports gear when bicycling and rowing. This can help to prevent developing an injury that is caused by repeated strain or friction. °SEEK MEDICAL CARE IF: °· Your pain becomes worse. °· Your bowel movements cause a great deal of discomfort. °· You are unable to have a bowel movement. °· You have uncontrolled urine loss (urinary incontinence). °· You have a fever. °  °This information is not intended to replace advice given to you by your health care provider. Make sure you discuss any questions you have with your health care provider. °  °Document Released: 02/03/2000 Document Revised: 06/22/2014 Document Reviewed: 02/01/2014 °Elsevier Interactive Patient Education ©2016 [employer] ° °

## 2015-01-07 NOTE — Telephone Encounter (Signed)
Patient was informed of her x-ray results and was encouraged to give [place] a call back if her sx persists or worsen so that a referral to physical therapy can be placed. She said thanks.   A copy was mailed to her home address.

## 2015-01-07 NOTE — Progress Notes (Signed)
Name: Claire [name]   MRN: [id number]    DOB: [birth year]   Date:01/07/2015       Progress Note  Subjective  Chief Complaint  Chief Complaint  Patient presents with  . Tailbone Pain    Patient stated that it started hurting about 3-4 weeks ago, but does not recall an injury. Recently seen by Dr. Ancil Boozer for sciatica and was given naproxen.  Marland Kitchen Spasms    patient stated that her husband noticed that she has been having some sporadic muscle twitches.    HPI  Claire [name] is a 44 year old female here today to discuss almost 1 month duration of pain in the tailbone area without known injury or fall.  Patient complains of tailbone pain for which has been present for 4 weeks. Pain is located in lower back and midline tailbone area, is described as aching, and is intermittent, moderate .  Associated symptoms include: muscle twitching without pain.  The patient has tried nothing for pain relief.  Related to injury:   Not that she can recall. She has tried working with Art therapist but he noted stiffness in her lower back and pelvic area. She has been using ice every hour for about 47mns each session, using her naproxen as well as flexeril (originally prescribed for headaches) which has not resolved symptoms. Does take iron and potassium oral supplements    Past Medical History  Diagnosis Date  . GERD (gastroesophageal reflux disease)   . IBS (irritable bowel syndrome)   . Diabetes mellitus without complication ([facility]   . Bilateral leg cramps   . Muscle spasm   . Vitamin D deficiency   . Anemia   . Kidney stone   . Seasonal allergies   . Asthma   . DJD (degenerative joint disease)   . Lung nodules     Patient Active Problem List   Diagnosis Date Noted  . Intertrigo 11/05/2014  . Perennial allergic rhinitis 09/03/2014  . History of morbid obesity 09/03/2014  . Lung nodules 09/03/2014  . Vitamin D deficiency 09/03/2014  . History of diabetes mellitus 09/03/2014  . History of  kidney stones 09/03/2014  . Chronic insomnia 09/03/2014  . History of iron deficiency anemia 09/03/2014  . Depression with anxiety 09/03/2014  . Palpitations 06/22/2014  . History of bariatric surgery 12/03/2013  . Body mass index 27.0-27.9, adult 12/03/2013  . Asthma, mild persistent 11/21/2012  . CN (constipation) 11/21/2012  . GERD without esophagitis 11/21/2012  . History of hyperlipidemia 11/21/2012  . Migraine with aura without status migrainosus 11/21/2012  . History of sleep apnea 11/21/2012  . Abdominal pain 08/15/2012    Social History  Substance Use Topics  . Smoking status: Former Smoker -- 1.00 packs/day for 10 years    Types: Cigarettes    Quit date: 02/19/1993  . Smokeless tobacco: Never Used  . Alcohol Use: 0.0 oz/week    0 Standard drinks or equivalent per week     Comment: occasionally     Current outpatient prescriptions:  .  albuterol (PROVENTIL HFA;VENTOLIN HFA) 108 (90 BASE) MCG/ACT inhaler, Inhale 2 puffs into the lungs every 6 (six) hours as needed for wheezing or shortness of breath., Disp: 3 Inhaler, Rfl: 0 .  ALPRAZolam (XANAX) 0.5 MG tablet, TAKE 1 TABLET BY MOUTH AT BEDTIME AS NEEDED FOR ANXIETY, Disp: 30 tablet, Rfl: 0 .  Ascorbic Acid (VITAMIN C) 1000 MG tablet, Take 1,000 mg by mouth., Disp: , Rfl:  .  calcium carbonate (OS-CAL) 600  MG TABS tablet, Take 600 mg by mouth daily with breakfast., Disp: , Rfl:  .  Cholecalciferol (VITAMIN D) 1000 UNITS capsule, Take 1,000 Units by mouth daily., Disp: , Rfl:  .  citalopram (CELEXA) 40 MG tablet, Take 1 tablet (40 mg total) by mouth daily. 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  cyclobenzaprine (FLEXERIL) 10 MG tablet, , Disp: , Rfl: 0 .  ferrous sulfate 325 (65 FE) MG EC tablet, Take 325 mg by mouth 3 (three) times daily with meals., Disp: , Rfl:  .  ibuprofen (ADVIL,MOTRIN) 200 MG tablet, Take 200 mg by mouth every 4 (four) hours as needed., Disp: , Rfl:  .  lansoprazole (PREVACID) 30 MG  capsule, Take 1 capsule (30 mg total) by mouth 2 (two) times daily before a meal., Disp: 180 capsule, Rfl: 1 .  loratadine (CLARITIN) 10 MG tablet, Take 10 mg by mouth daily., Disp: , Rfl:  .  mometasone (NASONEX) 50 MCG/ACT nasal spray, USE 2 SPRAYS IN EACH NOSTRIL AT BEDTIME, Disp: 51 g, Rfl: 1 .  Multiple Vitamin (MULTIVITAMIN) tablet, Take 1 tablet by mouth daily., Disp: , Rfl:  .  naproxen (NAPROSYN) 500 MG tablet, Take 1 tablet (500 mg total) by mouth 2 (two) times daily with a meal., Disp: 60 tablet, Rfl: 0 .  Potassium Aminobenzoate 500 MG CAPS, Take 1 capsule by mouth daily., Disp: , Rfl:  .  promethazine (PHENERGAN) 12.5 MG tablet, TAKE 1 TABLET BY MOUTH EVERY 6 HOURS AS NEEDED FOR MIGRAINES WITH NAUSEA, Disp: 20 tablet, Rfl: 2 .  vitamin B-12 (CYANOCOBALAMIN) 100 MCG tablet, Take 100 mcg by mouth as directed. , Disp: , Rfl:  .  zolpidem (AMBIEN) 10 MG tablet, Must be [employer], Disp: 90 tablet, Rfl: 0 .  zonisamide (ZONEGRAN) 100 MG capsule, TK 1 C PO D, Disp: , Rfl: 1  Past Surgical History  Procedure Laterality Date  . Laparoscopic gastric sleeve resection  2012  . Cholecystectomy  2010  . Bladder surgery  2010  . Plantar fascia surgery Bilateral   . Breast biopsy Right 2014    stereotatic biopsy    Family History  Problem Relation Age of Onset  . Breast cancer Paternal Aunt   . Breast cancer Paternal Grandmother   . Lung cancer Maternal Grandfather   . Heart attack Mother   . Stroke Mother   . Multiple myeloma Mother   . Hyperlipidemia Father   . Heart attack Sister   . Heart attack Maternal Uncle   . Heart attack Paternal Grandfather     Allergies  Allergen Reactions  . Other Hives    Ricotta Cheese: flushed and hot  . Vicodin [Hydrocodone-Acetaminophen] Itching  . Cymbalta [Duloxetine Hcl] Palpitations    And photosensitivity     Review of Systems  CONSTITUTIONAL: No significant weight changes, fever, chills, weakness or fatigue.  CARDIOVASCULAR:  No chest pain, chest pressure or chest discomfort. No palpitations or edema.  RESPIRATORY: No shortness of breath, cough or sputum.  GENITOURINARY: No dysuria. No frequency. No discharge.  NEUROLOGICAL: No headache, dizziness, syncope, paralysis, ataxia, numbness or tingling in the extremities. No memory changes. No change in bowel or bladder control.  MUSCULOSKELETAL: Yes joint pain. No muscle pain. ENDOCRINOLOGIC: No reports of sweating, cold or heat intolerance. No polyuria or polydipsia.     Objective  Pulse 85  Temp(Src) 98.3 F (36.8 C) (Oral)  Resp 16  Ht 5' 1"  (1.549 m)  Wt 161 lb 6.4 oz (  73.211 kg)  BMI 30.51 kg/m2  SpO2 97%  LMP 01/02/2015 Body mass index is 30.51 kg/(m^2).  Physical Exam  Constitutional: Patient is overweight and well-nourished. In no distress.  Cardiovascular: Normal rate, regular rhythm and normal heart sounds.  No murmur heard.  Pulmonary/Chest: Effort normal and breath sounds normal. No respiratory distress. Musculoskeletal: Normal range of motion bilateral UE and LE, no joint effusions. Thoracic, lumbar spine normal curvature with no palpable step off or point tenderness. Sacral area mild reproducible tenderness. Negative straight leg testing bilaterally, full ROM at hip joints.  Peripheral vascular: Bilateral LE no edema. Neurological: CN II-XII grossly intact with no focal deficits. Alert and oriented to person, place, and time. Coordination, balance, strength, speech and gait are normal.  Skin: Skin is warm and dry. No rash noted. No erythema.  Psychiatric: Patient has a normal mood and affect. Behavior is normal in office today. Judgment and thought content normal in office today.   Recent Results (from the past 2160 hour(s))  CBC with Differential     Status: Abnormal   Collection Time: 10/12/14  4:36 PM  Result Value Ref Range   WBC 7.9 3.4 - 10.8 x10E3/uL   RBC 4.52 3.77 - 5.28 x10E6/uL   Hemoglobin 11.9 11.1 - 15.9 g/dL   Hematocrit  37.1 34.0 - 46.6 %   MCV 82 79 - 97 fL   MCH 26.3 (L) 26.6 - 33.0 pg   MCHC 32.1 31.5 - 35.7 g/dL   RDW 19.0 (H) 12.3 - 15.4 %   Platelets 310 150 - 379 x10E3/uL   Neutrophils 60 %   Lymphs 25 %   Monocytes 9 %   Eos 5 %   Basos 1 %   Neutrophils Absolute 4.7 1.4 - 7.0 x10E3/uL   Lymphocytes Absolute 2.0 0.7 - 3.1 x10E3/uL   Monocytes Absolute 0.7 0.1 - 0.9 x10E3/uL   EOS (ABSOLUTE) 0.4 0.0 - 0.4 x10E3/uL   Basophils Absolute 0.0 0.0 - 0.2 x10E3/uL   Immature Granulocytes 0 %   Immature Grans (Abs) 0.0 0.0 - 0.1 x10E3/uL     Assessment & Plan  1. Lumbosacral pain Etiology unclear will get imaging, increase pain relief medication to Tramadol and Baclofen for muscle twitching. Recommended PT if symptoms ongoing.  - DG Sacrum/Coccyx; Future - DG Lumbar Spine Complete; Future - baclofen (LIORESAL) 10 MG tablet; Take 1 tablet (10 mg total) by mouth 3 (three) times daily as needed for muscle spasms.  Dispense: 30 each; Refill: 1 - traMADol (ULTRAM) 50 MG tablet; Take 1 tablet (50 mg total) by mouth every 12 (twelve) hours as needed for moderate pain or severe pain.  Dispense: 40 tablet; Refill: 0  2. Muscle twitching Rule out electrolyte imbalance, anemia. Instructed patient to keep well hydrated.   - CBC with Differential/Platelet - Comprehensive metabolic panel - Magnesium - baclofen (LIORESAL) 10 MG tablet; Take 1 tablet (10 mg total) by mouth 3 (three) times daily as needed for muscle spasms.  Dispense: 30 each; Refill: 1 - Ferritin - Iron - Iron and TIBC - traMADol (ULTRAM) 50 MG tablet; Take 1 tablet (50 mg total) by mouth every 12 (twelve) hours as needed for moderate pain or severe pain.  Dispense: 40 tablet; Refill: 0

## 2015-01-08 LAB — CBC WITH DIFFERENTIAL/PLATELET
BASOS ABS: 0.1 10*3/uL (ref 0.0–0.2)
Basos: 1 %
EOS (ABSOLUTE): 0.5 10*3/uL — AB (ref 0.0–0.4)
Eos: 6 %
HEMOGLOBIN: 13.6 g/dL (ref 11.1–15.9)
Hematocrit: 40.3 % (ref 34.0–46.6)
Immature Grans (Abs): 0 10*3/uL (ref 0.0–0.1)
Immature Granulocytes: 0 %
LYMPHS ABS: 1.4 10*3/uL (ref 0.7–3.1)
LYMPHS: 18 %
MCH: 29.8 pg (ref 26.6–33.0)
MCHC: 33.7 g/dL (ref 31.5–35.7)
MCV: 88 fL (ref 79–97)
MONOCYTES: 7 %
Monocytes Absolute: 0.5 10*3/uL (ref 0.1–0.9)
NEUTROS ABS: 5.2 10*3/uL (ref 1.4–7.0)
Neutrophils: 68 %
PLATELETS: 319 10*3/uL (ref 150–379)
RBC: 4.56 x10E6/uL (ref 3.77–5.28)
RDW: 13.8 % (ref 12.3–15.4)
WBC: 7.6 10*3/uL (ref 3.4–10.8)

## 2015-01-08 LAB — COMPREHENSIVE METABOLIC PANEL
ALK PHOS: 74 IU/L (ref 39–117)
ALT: 16 IU/L (ref 0–32)
AST: 18 IU/L (ref 0–40)
Albumin/Globulin Ratio: 1.5 (ref 1.1–2.5)
Albumin: 4.1 g/dL (ref 3.5–5.5)
BILIRUBIN TOTAL: 0.4 mg/dL (ref 0.0–1.2)
BUN/Creatinine Ratio: 17 (ref 9–23)
BUN: 11 mg/dL (ref 6–24)
CHLORIDE: 101 mmol/L (ref 97–106)
CO2: 26 mmol/L (ref 18–29)
CREATININE: 0.63 mg/dL (ref 0.57–1.00)
Calcium: 9.2 mg/dL (ref 8.7–10.2)
GFR calc Af Amer: 128 mL/min/{1.73_m2} (ref >59)
GFR calc non Af Amer: 111 mL/min/{1.73_m2} (ref >59)
GLUCOSE: 74 mg/dL (ref 65–99)
Globulin, Total: 2.8 g/dL (ref 1.5–4.5)
Potassium: 4.3 mmol/L (ref 3.5–5.2)
Sodium: 139 mmol/L (ref 136–144)
Total Protein: 6.9 g/dL (ref 6.0–8.5)

## 2015-01-08 LAB — FERRITIN: FERRITIN: 47 ng/mL (ref 15–150)

## 2015-01-08 LAB — IRON AND TIBC
IRON SATURATION: 51 % (ref 15–55)
IRON: 159 ug/dL (ref 27–159)
TIBC: 310 ug/dL (ref 250–450)
UIBC: 151 ug/dL (ref 131–425)

## 2015-01-08 LAB — MAGNESIUM: MAGNESIUM: 1.9 mg/dL (ref 1.6–2.3)

## 2015-01-25 ENCOUNTER — Telehealth: Payer: Self-pay | Admitting: Family Medicine

## 2015-01-25 NOTE — Telephone Encounter (Signed)
ERRENOUS °

## 2015-01-26 ENCOUNTER — Telehealth: Payer: Self-pay

## 2015-01-26 ENCOUNTER — Encounter: Payer: Self-pay | Admitting: Family Medicine

## 2015-01-26 ENCOUNTER — Ambulatory Visit (INDEPENDENT_AMBULATORY_CARE_PROVIDER_SITE_OTHER): Payer: BLUE CROSS/BLUE SHIELD | Admitting: Family Medicine

## 2015-01-26 VITALS — BP 116/68 | HR 77 | Temp 98.1°F | Resp 16 | Ht 61.0 in | Wt 164.7 lb

## 2015-01-26 DIAGNOSIS — M545 Low back pain, unspecified: Secondary | ICD-10-CM

## 2015-01-26 DIAGNOSIS — G253 Myoclonus: Secondary | ICD-10-CM

## 2015-01-26 DIAGNOSIS — M5489 Other dorsalgia: Secondary | ICD-10-CM | POA: Diagnosis not present

## 2015-01-26 DIAGNOSIS — K589 Irritable bowel syndrome without diarrhea: Secondary | ICD-10-CM

## 2015-01-26 DIAGNOSIS — K5909 Other constipation: Secondary | ICD-10-CM | POA: Diagnosis not present

## 2015-01-26 MED ORDER — CYCLOBENZAPRINE HCL 10 MG PO TABS: 10.0000 mg | ORAL_TABLET | Freq: Every day | ORAL | Status: DC

## 2015-01-26 MED ORDER — LINACLOTIDE 145 MCG PO CAPS: 145.0000 ug | ORAL_CAPSULE | Freq: Every day | ORAL | Status: DC

## 2015-01-26 NOTE — Progress Notes (Signed)
Name: Claire [name]   MRN: [id number]    DOB: 01/24/1971   Date:01/26/2015       Progress Note  Subjective  Chief Complaint  Chief Complaint  Patient presents with  . Constipation    onset 1 week bloating and passing gas with foul odor  . Back Pain    onset 3 weeks saw Dr. Nadine Counts 2 weeks ago was given pain med and muscle relaxer.  Patient states worse having muscle spasms and has sore muscle all over    HPI  IBS : she has a history of IBS, but years ago was more diarrhea type. Over the past 10 days she has noticed worsening of constipation, with bloating, passing a lot a gas - but has to stand up to be able to pass it. She has abdominal cramping before bowel movements. Occasionally she has some associated nausea with cramping sensation.   Back pain: she has noticed worsening of low back pain over the past 6  weeks. She was seen by Dr. Nadine Counts and given Baclofen but it has not helped with symptoms. She has aching on lower back and buttocks, average 7/10 - but worse at night. Sitting makes it worse - pressure on buttocks - also when shifting in bed during the night. No tingling or numbness. 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   Myoclonus: she has noticed myoclonus  on arms, legs and some on right flank. Only lasts a few seconds, it happens many times daily.   Patient Active Problem List   Diagnosis Date Noted  . Lumbosacral pain 01/07/2015  . Muscle twitching 01/07/2015  . Intertrigo 11/05/2014  . Perennial allergic rhinitis 09/03/2014  . History of morbid obesity 09/03/2014  . Lung nodules 09/03/2014  . Vitamin D deficiency 09/03/2014  . History of diabetes mellitus 09/03/2014  . History of kidney stones 09/03/2014  . Chronic insomnia 09/03/2014  . History of iron deficiency anemia 09/03/2014  . Depression with anxiety 09/03/2014  . Palpitations 06/22/2014  . History of bariatric surgery 12/03/2013  . Body mass  index 27.0-27.9, adult 12/03/2013  . Asthma, mild persistent 11/21/2012  . CN (constipation) 11/21/2012  . GERD without esophagitis 11/21/2012  . History of hyperlipidemia 11/21/2012  . Migraine with aura without status migrainosus 11/21/2012  . History of sleep apnea 11/21/2012  . IBS (irritable bowel syndrome) 08/15/2012    Past Surgical History  Procedure Laterality Date  . Laparoscopic gastric sleeve resection  2012  . Cholecystectomy  2010  . Bladder surgery  2010  . Plantar fascia surgery Bilateral   . Breast biopsy Right 2014    stereotatic biopsy    Family History  Problem Relation Age of Onset  . Breast cancer Paternal Aunt   . Breast cancer Paternal Grandmother   . Lung cancer Maternal Grandfather   . Heart attack Mother   . Stroke Mother   . Multiple myeloma Mother   . Hyperlipidemia Father   . Heart attack Sister   . Heart attack Maternal Uncle   . Heart attack Paternal Grandfather     Social History   Social History  . Marital Status: Married    Spouse Name: N/A  . Number of Children: N/A  . Years of Education: N/A   Occupational History  . Not on file.   Social History Main Topics  . Smoking status: Former Smoker -- 1.00 packs/day for 10 years    Types:  Cigarettes    Quit date: 02/19/1993  . Smokeless tobacco: Never Used  . Alcohol Use: 0.0 oz/week    0 Standard drinks or equivalent per week     Comment: occasionally  . Drug Use: No  . Sexual Activity:    Partners: Male   Other Topics Concern  . Not on file   Social History Narrative     Current outpatient prescriptions:  .  albuterol (PROVENTIL HFA;VENTOLIN HFA) 108 (90 BASE) MCG/ACT inhaler, Inhale 2 puffs into the lungs every 6 (six) hours as needed for wheezing or shortness of breath., Disp: 3 Inhaler, Rfl: 0 .  ALPRAZolam (XANAX) 0.5 MG tablet, TAKE 1 TABLET BY MOUTH AT BEDTIME AS NEEDED FOR ANXIETY, Disp: 30 tablet, Rfl: 0 .  Ascorbic Acid (VITAMIN C) 1000 MG tablet, Take 1,000 mg  by mouth., Disp: , Rfl:  .  calcium carbonate (OS-CAL) 600 MG TABS tablet, Take 600 mg by mouth daily with breakfast., Disp: , Rfl:  .  Cholecalciferol (VITAMIN D) 1000 UNITS capsule, Take 1,000 Units by mouth daily., Disp: , Rfl:  .  citalopram (CELEXA) 40 MG tablet, Take 1 tablet (40 mg total) by mouth daily. 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  cyclobenzaprine (FLEXERIL) 10 MG tablet, Take 1 tablet (10 mg total) by mouth at bedtime., Disp: 30 tablet, Rfl: 0 .  ferrous sulfate 325 (65 FE) MG EC tablet, Take 325 mg by mouth 3 (three) times daily with meals., Disp: , Rfl:  .  lansoprazole (PREVACID) 30 MG capsule, Take 1 capsule (30 mg total) by mouth 2 (two) times daily before a meal., Disp: 180 capsule, Rfl: 1 .  Linaclotide (LINZESS) 145 MCG CAPS capsule, Take 1 capsule (145 mcg total) by mouth daily., Disp: 30 capsule, Rfl: 0 .  loratadine (CLARITIN) 10 MG tablet, Take 10 mg by mouth daily., Disp: , Rfl:  .  mometasone (NASONEX) 50 MCG/ACT nasal spray, USE 2 SPRAYS IN EACH NOSTRIL AT BEDTIME, Disp: 51 g, Rfl: 1 .  Multiple Vitamin (MULTIVITAMIN) tablet, Take 1 tablet by mouth daily., Disp: , Rfl:  .  naproxen (NAPROSYN) 500 MG tablet, Take 1 tablet (500 mg total) by mouth 2 (two) times daily with a meal., Disp: 60 tablet, Rfl: 0 .  Potassium Aminobenzoate 500 MG CAPS, Take 1 capsule by mouth daily., Disp: , Rfl:  .  promethazine (PHENERGAN) 12.5 MG tablet, TAKE 1 TABLET BY MOUTH EVERY 6 HOURS AS NEEDED FOR MIGRAINES WITH NAUSEA, Disp: 20 tablet, Rfl: 2 .  traMADol (ULTRAM) 50 MG tablet, Take 1 tablet (50 mg total) by mouth every 12 (twelve) hours as needed for moderate pain or severe pain., Disp: 40 tablet, Rfl: 0 .  vitamin B-12 (CYANOCOBALAMIN) 100 MCG tablet, Take 100 mcg by mouth as directed. , Disp: , Rfl:  .  zolpidem (AMBIEN) 10 MG tablet, Must be [employer], Disp: 90 tablet, Rfl: 0 .  zonisamide (ZONEGRAN) 100 MG capsule, TK 1 C PO D, Disp: , Rfl: 1  Allergies   Allergen Reactions  . Other Hives    Ricotta Cheese: flushed and hot  . Vicodin [Hydrocodone-Acetaminophen] Itching  . Cymbalta [Duloxetine Hcl] Palpitations    And photosensitivity     ROS  Constitutional: Negative for fever , mild  weight change.  Respiratory: Mild  cough , no shortness of breath.   Cardiovascular: Negative for chest pain or palpitations.  Gastrointestinal: Positive  for abdominal pain but  no bowel changes.  Musculoskeletal: Negative for  gait problem or joint swelling.  Skin: Negative for rash.  Neurological: Negative for dizziness or headache.  No other specific complaints in a complete review of systems (except as listed in HPI above).  Objective  Filed Vitals:   01/26/15 0844  BP: 116/68  Pulse: 77  Temp: 98.1 F (36.7 C)  TempSrc: Oral  Resp: 16  Height: 5' 1"  (1.549 m)  Weight: 164 lb 11.2 oz (74.707 kg)  SpO2: 99%    Body mass index is 31.14 kg/(m^2).  Physical Exam  Constitutional: Patient appears well-developed and well-nourished. Obese No distress.  HEENT: head atraumatic, normocephalic, pupils equal and reactive to light, neck supple, throat within normal limits Cardiovascular: Normal rate, regular rhythm and normal heart sounds.  No murmur heard. No BLE edema. Pulmonary/Chest: Effort normal and breath sounds normal. No respiratory distress. Abdominal: Soft.  There is no tenderness. Psychiatric: Patient has a normal mood and affect. behavior is normal. Judgment and thought content normal. Muscular Skeletal: pain during palpation of lumbar spine, pain with rom of lumbar spine, negative straight leg raise, normal quad strenght  Recent Results (from the past 2160 hour(s))  CBC with Differential/Platelet     Status: Abnormal   Collection Time: 01/07/15 10:25 AM  Result Value Ref Range   WBC 7.6 3.4 - 10.8 x10E3/uL   RBC 4.56 3.77 - 5.28 x10E6/uL   Hemoglobin 13.6 11.1 - 15.9 g/dL   Hematocrit 40.3 34.0 - 46.6 %   MCV 88 79 - 97 fL    MCH 29.8 26.6 - 33.0 pg   MCHC 33.7 31.5 - 35.7 g/dL   RDW 13.8 12.3 - 15.4 %   Platelets 319 150 - 379 x10E3/uL   Neutrophils 68 %   Lymphs 18 %   Monocytes 7 %   Eos 6 %   Basos 1 %   Neutrophils Absolute 5.2 1.4 - 7.0 x10E3/uL   Lymphocytes Absolute 1.4 0.7 - 3.1 x10E3/uL   Monocytes Absolute 0.5 0.1 - 0.9 x10E3/uL   EOS (ABSOLUTE) 0.5 (H) 0.0 - 0.4 x10E3/uL   Basophils Absolute 0.1 0.0 - 0.2 x10E3/uL   Immature Granulocytes 0 %   Immature Grans (Abs) 0.0 0.0 - 0.1 x10E3/uL  Comprehensive metabolic panel     Status: None   Collection Time: 01/07/15 10:25 AM  Result Value Ref Range   Glucose 74 65 - 99 mg/dL   BUN 11 6 - 24 mg/dL   Creatinine, Ser 0.63 0.57 - 1.00 mg/dL   GFR calc non Af Amer 111 >59 mL/min/1.73   GFR calc Af Amer 128 >59 mL/min/1.73   BUN/Creatinine Ratio 17 9 - 23   Sodium 139 136 - 144 mmol/L   Potassium 4.3 3.5 - 5.2 mmol/L   Chloride 101 97 - 106 mmol/L   CO2 26 18 - 29 mmol/L   Calcium 9.2 8.7 - 10.2 mg/dL   Total Protein 6.9 6.0 - 8.5 g/dL   Albumin 4.1 3.5 - 5.5 g/dL   Globulin, Total 2.8 1.5 - 4.5 g/dL   Albumin/Globulin Ratio 1.5 1.1 - 2.5   Bilirubin Total 0.4 0.0 - 1.2 mg/dL   Alkaline Phosphatase 74 39 - 117 IU/L   AST 18 0 - 40 IU/L   ALT 16 0 - 32 IU/L  Magnesium     Status: None   Collection Time: 01/07/15 10:25 AM  Result Value Ref Range   Magnesium 1.9 1.6 - 2.3 mg/dL  Ferritin     Status: None   Collection Time: 01/07/15  10:25 AM  Result Value Ref Range   Ferritin 47 15 - 150 ng/mL  Iron and TIBC     Status: None   Collection Time: 01/07/15 10:25 AM  Result Value Ref Range   Total Iron Binding Capacity 310 250 - 450 ug/dL   UIBC 151 131 - 425 ug/dL   Iron 159 27 - 159 ug/dL   Iron Saturation 51 15 - 55 %     PHQ2/9: Depression screen [facility] 2/9 01/07/2015 11/05/2014 09/03/2014  Decreased Interest 0 1 0  Down, Depressed, Hopeless 0 1 0  PHQ - 2 Score 0 2 0  Altered sleeping - 3 -  Tired, decreased energy - 3 -  Change in  appetite - 2 -  Feeling bad or failure about yourself  - 0 -  Trouble concentrating - 3 -  Moving slowly or fidgety/restless - 1 -  Suicidal thoughts - 0 -  PHQ-9 Score - 14 -  Difficult doing work/chores - Not difficult at all -     Fall Risk: Fall Risk  01/07/2015 11/05/2014 09/03/2014  Falls in the past year? No No No    Assessment & Plan  1. IBS (irritable bowel syndrome)  - Linaclotide (LINZESS) 145 MCG CAPS capsule; Take 1 capsule (145 mcg total) by mouth daily.  Dispense: 30 capsule; Refill: 0  2. Other constipation  - Linaclotide (LINZESS) 145 MCG CAPS capsule; Take 1 capsule (145 mcg total) by mouth daily.  Dispense: 30 capsule; Refill: 0  3. Lumbosacral pain  Advised foam rolls, we will try Flexeril and Tylenol, likely DDD causing symptoms, but if worsening we will need MRI of lumbar spine and sacrum - patient denies trauma. Advised back exercises also - cyclobenzaprine (FLEXERIL) 10 MG tablet; Take 1 tablet (10 mg total) by mouth at bedtime.  Dispense: 30 tablet; Refill: 0  4. Myoclonus  Labs reviewed and normal  Reassurance, drink more water.

## 2015-01-26 NOTE — Patient Instructions (Signed)

## 2015-01-26 NOTE — Telephone Encounter (Signed)
Please change linzess to [employer]

## 2015-01-27 MED ORDER — LUBIPROSTONE 24 MCG PO CAPS: 24.0000 ug | ORAL_CAPSULE | Freq: Two times a day (BID) | ORAL | Status: DC

## 2015-01-27 NOTE — Telephone Encounter (Signed)
done

## 2015-01-31 ENCOUNTER — Encounter: Payer: Self-pay | Admitting: Family Medicine

## 2015-01-31 ENCOUNTER — Ambulatory Visit (INDEPENDENT_AMBULATORY_CARE_PROVIDER_SITE_OTHER): Payer: BLUE CROSS/BLUE SHIELD | Admitting: Family Medicine

## 2015-01-31 VITALS — BP 108/62 | HR 100 | Temp 98.0°F | Resp 18 | Ht 65.0 in | Wt 165.7 lb

## 2015-01-31 DIAGNOSIS — K589 Irritable bowel syndrome without diarrhea: Secondary | ICD-10-CM | POA: Diagnosis not present

## 2015-01-31 DIAGNOSIS — M5489 Other dorsalgia: Secondary | ICD-10-CM | POA: Diagnosis not present

## 2015-01-31 DIAGNOSIS — M545 Low back pain, unspecified: Secondary | ICD-10-CM

## 2015-01-31 DIAGNOSIS — G4700 Insomnia, unspecified: Secondary | ICD-10-CM | POA: Diagnosis not present

## 2015-01-31 DIAGNOSIS — K219 Gastro-esophageal reflux disease without esophagitis: Secondary | ICD-10-CM | POA: Diagnosis not present

## 2015-01-31 DIAGNOSIS — F5104 Psychophysiologic insomnia: Secondary | ICD-10-CM

## 2015-01-31 DIAGNOSIS — J4530 Mild persistent asthma, uncomplicated: Secondary | ICD-10-CM

## 2015-01-31 DIAGNOSIS — F418 Other specified anxiety disorders: Secondary | ICD-10-CM

## 2015-01-31 MED ORDER — CITALOPRAM HYDROBROMIDE 40 MG PO TABS: 40.0000 mg | ORAL_TABLET | Freq: Every day | ORAL | Status: DC

## 2015-01-31 MED ORDER — ZOLPIDEM TARTRATE 10 MG PO TABS: ORAL_TABLET | ORAL | Status: DC

## 2015-01-31 MED ORDER — ALPRAZOLAM 0.5 MG PO TABS: 0.5000 mg | ORAL_TABLET | Freq: Every evening | ORAL | Status: DC | PRN

## 2015-01-31 MED ORDER — MONTELUKAST SODIUM 10 MG PO TABS: 10.0000 mg | ORAL_TABLET | Freq: Every day | ORAL | Status: DC

## 2015-01-31 NOTE — Progress Notes (Signed)
Name: Claire [name]   MRN: [id number]    DOB: [birth year]   Date:01/31/2015       Progress Note  Subjective  Chief Complaint  Chief Complaint  Patient presents with  . Medication Refill    follow-up  . Insomnia  . Anxiety  . Depression  . Gastroesophageal Reflux  . Constipation    since taking amitza has been having dirrhea     HPI   IBS : she has a history of IBS, but years ago was more diarrhea type. Over the past 2 weeks  she has noticed worsening of constipation, with bloating, passing a lot a gas - but has to stand up to be able to pass it. She has abdominal cramping before bowel movements. Pain is worse on RUQ and radiates to her back ( history of cholecystectomy )  Occasionally she has some associated nausea with cramping sensation. She is now on Amitiza but had severe diarrhea, unable to take it this am because she had to go to work.   Back pain: she has noticed worsening of low back pain over the past 7 weeks. She was seen by Dr. Nadine Counts and given Baclofen but it has not helped with symptoms. She has aching on lower back and buttocks, average 7/10 - but worse at night. Sitting makes it worse - pressure on buttocks - also when shifting in bed during the night. No tingling or numbness. 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 She is now on Flexeril and Tylenol   Depression/Anxiety: she is now on Citalopram 40 mg and symptoms are better controlled. Still overwhelmed with work, raising a family and feeling like kids are not helping enough. She states she has noticed improvement in her level of energy and motivation. She has no orgasm with Citalopram but does not want to change medication at this time.   Insomnia: taking medication still wakes up at 2 to 3 am but able to fall back asleep. Doing better. Still taking Ambien.   GERD: she needs refill of Prevacid it has to [employer] brand or it does not work for her.   Asthma  Mild Persistent :symptoms are under controlled at this time, using rescue inhaler very seldom, but still has a cough a couple of times of week , no wheezing no SOB  Patient Active Problem List   Diagnosis Date Noted  . Lumbosacral pain 01/07/2015  . Muscle twitching 01/07/2015  . Intertrigo 11/05/2014  . Perennial allergic rhinitis 09/03/2014  . History of morbid obesity 09/03/2014  . Lung nodules 09/03/2014  . Vitamin D deficiency 09/03/2014  . History of diabetes mellitus 09/03/2014  . History of kidney stones 09/03/2014  . Chronic insomnia 09/03/2014  . History of iron deficiency anemia 09/03/2014  . Depression with anxiety 09/03/2014  . Palpitations 06/22/2014  . History of bariatric surgery 12/03/2013  . Body mass index 27.0-27.9, adult 12/03/2013  . Asthma, mild persistent 11/21/2012  . CN (constipation) 11/21/2012  . GERD without esophagitis 11/21/2012  . History of hyperlipidemia 11/21/2012  . Migraine with aura without status migrainosus 11/21/2012  . History of sleep apnea 11/21/2012  . IBS (irritable bowel syndrome) 08/15/2012    Past Surgical History  Procedure Laterality Date  . Laparoscopic gastric sleeve resection  2012  . Cholecystectomy  2010  . Bladder surgery  2010  . Plantar fascia surgery Bilateral   . Breast biopsy Right 2014    stereotatic  biopsy    Family History  Problem Relation Age of Onset  . Breast cancer Paternal Aunt   . Breast cancer Paternal Grandmother   . Lung cancer Maternal Grandfather   . Heart attack Mother   . Stroke Mother   . Multiple myeloma Mother   . Hyperlipidemia Father   . Heart attack Sister   . Heart attack Maternal Uncle   . Heart attack Paternal Grandfather     Social History   Social History  . Marital Status: Married    Spouse Name: N/A  . Number of Children: N/A  . Years of Education: N/A   Occupational History  . Not on file.   Social History Main Topics  . Smoking status: Former Smoker -- 1.00  packs/day for 10 years    Types: Cigarettes    Quit date: 02/19/1993  . Smokeless tobacco: Never Used  . Alcohol Use: 0.0 oz/week    0 Standard drinks or equivalent per week     Comment: occasionally  . Drug Use: No  . Sexual Activity:    Partners: Male   Other Topics Concern  . Not on file   Social History Narrative     Current outpatient prescriptions:  .  albuterol (PROVENTIL HFA;VENTOLIN HFA) 108 (90 BASE) MCG/ACT inhaler, Inhale 2 puffs into the lungs every 6 (six) hours as needed for wheezing or shortness of breath., Disp: 3 Inhaler, Rfl: 0 .  ALPRAZolam (XANAX) 0.5 MG tablet, TAKE 1 TABLET BY MOUTH AT BEDTIME AS NEEDED FOR ANXIETY, Disp: 30 tablet, Rfl: 0 .  Ascorbic Acid (VITAMIN C) 1000 MG tablet, Take 1,000 mg by mouth., Disp: , Rfl:  .  calcium carbonate (OS-CAL) 600 MG TABS tablet, Take 600 mg by mouth daily with breakfast., Disp: , Rfl:  .  Cholecalciferol (VITAMIN D) 1000 UNITS capsule, Take 1,000 Units by mouth daily., Disp: , Rfl:  .  citalopram (CELEXA) 40 MG tablet, Take 1 tablet (40 mg total) by mouth daily. 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  cyclobenzaprine (FLEXERIL) 10 MG tablet, Take 1 tablet (10 mg total) by mouth at bedtime., Disp: 30 tablet, Rfl: 0 .  ferrous sulfate 325 (65 FE) MG EC tablet, Take 325 mg by mouth 3 (three) times daily with meals., Disp: , Rfl:  .  lansoprazole (PREVACID) 30 MG capsule, Take 1 capsule (30 mg total) by mouth 2 (two) times daily before a meal., Disp: 180 capsule, Rfl: 1 .  loratadine (CLARITIN) 10 MG tablet, Take 10 mg by mouth daily., Disp: , Rfl:  .  mometasone (NASONEX) 50 MCG/ACT nasal spray, USE 2 SPRAYS IN EACH NOSTRIL AT BEDTIME, Disp: 51 g, Rfl: 1 .  Multiple Vitamin (MULTIVITAMIN) tablet, Take 1 tablet by mouth daily., Disp: , Rfl:  .  Potassium Aminobenzoate 500 MG CAPS, Take 1 capsule by mouth daily., Disp: , Rfl:  .  promethazine (PHENERGAN) 12.5 MG tablet, TAKE 1 TABLET BY MOUTH EVERY 6 HOURS AS  NEEDED FOR MIGRAINES WITH NAUSEA, Disp: 20 tablet, Rfl: 2 .  vitamin B-12 (CYANOCOBALAMIN) 100 MCG tablet, Take 100 mcg by mouth as directed. , Disp: , Rfl:  .  zolpidem (AMBIEN) 10 MG tablet, Must be [employer], Disp: 90 tablet, Rfl: 0 .  zonisamide (ZONEGRAN) 100 MG capsule, TK 1 C PO D, Disp: , Rfl: 1  Allergies  Allergen Reactions  . Other Hives    Ricotta Cheese: flushed and hot  . Vicodin [Hydrocodone-Acetaminophen] Itching  . Cymbalta [Duloxetine  Hcl] Palpitations    And photosensitivity     ROS  Constitutional: Negative for fever or weight change.  Respiratory: Positive for cough no  shortness of breath.   Cardiovascular: Negative for chest pain or palpitations.  Gastrointestinal: Positive for abdominal pain, no bowel changes.  Musculoskeletal: Negative for gait problem or joint swelling.  Skin: Negative for rash.  Neurological: Negative for dizziness , intermittent  headaches.  No other specific complaints in a complete review of systems (except as listed in HPI above).  Objective  Filed Vitals:   01/31/15 1611  BP: 108/62  Pulse: 100  Temp: 98 F (36.7 C)  TempSrc: Oral  Resp: 18  Height: _0  (1.651 m)  Weight: 165 lb 11.2 oz (75.161 kg)  SpO2: 98%    Body mass index is 27.57 kg/(m^2).  Physical Exam   Constitutional: Patient appears well-developed and well-nourished. Obese No distress.  HEENT: head atraumatic, normocephalic, pupils equal and reactive to light, neck supple, throat within normal limits Cardiovascular: Normal rate, regular rhythm and normal heart sounds. No murmur heard. No BLE edema. Pulmonary/Chest: Effort normal and breath sounds normal. No respiratory distress. Abdominal: Soft. Normal bowel sounds, diffuse tenderness during palpation , no rebound tenderness Psychiatric: Patient has a normal mood and affect. behavior is normal. Judgment and thought content normal. Muscular Skeletal: pain during palpation of lumbar spine, pain  with rom of lumbar spine, negative straight leg raise, normal quad strength    Recent Results (from the past 2160 hour(s))  CBC with Differential/Platelet     Status: Abnormal   Collection Time: 01/07/15 10:25 AM  Result Value Ref Range   WBC 7.6 3.4 - 10.8 x10E3/uL   RBC 4.56 3.77 - 5.28 x10E6/uL   Hemoglobin 13.6 11.1 - 15.9 g/dL   Hematocrit 40.3 34.0 - 46.6 %   MCV 88 79 - 97 fL   MCH 29.8 26.6 - 33.0 pg   MCHC 33.7 31.5 - 35.7 g/dL   RDW 13.8 12.3 - 15.4 %   Platelets 319 150 - 379 x10E3/uL   Neutrophils 68 %   Lymphs 18 %   Monocytes 7 %   Eos 6 %   Basos 1 %   Neutrophils Absolute 5.2 1.4 - 7.0 x10E3/uL   Lymphocytes Absolute 1.4 0.7 - 3.1 x10E3/uL   Monocytes Absolute 0.5 0.1 - 0.9 x10E3/uL   EOS (ABSOLUTE) 0.5 (H) 0.0 - 0.4 x10E3/uL   Basophils Absolute 0.1 0.0 - 0.2 x10E3/uL   Immature Granulocytes 0 %   Immature Grans (Abs) 0.0 0.0 - 0.1 x10E3/uL  Comprehensive metabolic panel     Status: None   Collection Time: 01/07/15 10:25 AM  Result Value Ref Range   Glucose 74 65 - 99 mg/dL   BUN 11 6 - 24 mg/dL   Creatinine, Ser 0.63 0.57 - 1.00 mg/dL   GFR calc non Af Amer 111 >59 mL/min/1.73   GFR calc Af Amer 128 >59 mL/min/1.73   BUN/Creatinine Ratio 17 9 - 23   Sodium 139 136 - 144 mmol/L   Potassium 4.3 3.5 - 5.2 mmol/L   Chloride 101 97 - 106 mmol/L   CO2 26 18 - 29 mmol/L   Calcium 9.2 8.7 - 10.2 mg/dL   Total Protein 6.9 6.0 - 8.5 g/dL   Albumin 4.1 3.5 - 5.5 g/dL   Globulin, Total 2.8 1.5 - 4.5 g/dL   Albumin/Globulin Ratio 1.5 1.1 - 2.5   Bilirubin Total 0.4 0.0 - 1.2 mg/dL   Alkaline Phosphatase  74 39 - 117 IU/L   AST 18 0 - 40 IU/L   ALT 16 0 - 32 IU/L  Magnesium     Status: None   Collection Time: 01/07/15 10:25 AM  Result Value Ref Range   Magnesium 1.9 1.6 - 2.3 mg/dL  Ferritin     Status: None   Collection Time: 01/07/15 10:25 AM  Result Value Ref Range   Ferritin 47 15 - 150 ng/mL  Iron and TIBC     Status: None   Collection Time: 01/07/15  10:25 AM  Result Value Ref Range   Total Iron Binding Capacity 310 250 - 450 ug/dL   UIBC 151 131 - 425 ug/dL   Iron 159 27 - 159 ug/dL   Iron Saturation 51 15 - 55 %     PHQ2/9: Depression screen [facility] 2/9 01/07/2015 11/05/2014 09/03/2014  Decreased Interest 0 1 0  Down, Depressed, Hopeless 0 1 0  PHQ - 2 Score 0 2 0  Altered sleeping - 3 -  Tired, decreased energy - 3 -  Change in appetite - 2 -  Feeling bad or failure about yourself  - 0 -  Trouble concentrating - 3 -  Moving slowly or fidgety/restless - 1 -  Suicidal thoughts - 0 -  PHQ-9 Score - 14 -  Difficult doing work/chores - Not difficult at all -     Fall Risk: Fall Risk  01/07/2015 11/05/2014 09/03/2014  Falls in the past year? No No No      Assessment & Plan  1. Asthma, mild persistent, uncomplicated  We will add singulair since symptoms present weekly  - montelukast (SINGULAIR) 10 MG tablet; Take 1 tablet (10 mg total) by mouth at bedtime.  Dispense: 90 tablet; Refill: 1  2. IBS (irritable bowel syndrome)  - Ambulatory referral to Gastroenterology for worsening of abdominal pain  3. GERD without esophagitis  Under control   4. Chronic insomnia  improved - zolpidem (AMBIEN) 10 MG tablet; Must be [employer]  Dispense: 90 tablet; Refill: 0  5. Lumbosacral pain  Continue current regiment   6. Depression with anxiety  - citalopram (CELEXA) 40 MG tablet; Take 1 tablet (40 mg total) by mouth daily. 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

## 2015-01-31 NOTE — Addendum Note (Signed)
Addended by: Steele Sizer F on: 01/31/2015 05:01 PM   Modules accepted: Orders

## 2015-02-07 ENCOUNTER — Other Ambulatory Visit: Payer: Self-pay

## 2015-02-08 ENCOUNTER — Encounter: Payer: Self-pay | Admitting: Gastroenterology

## 2015-02-08 ENCOUNTER — Ambulatory Visit (INDEPENDENT_AMBULATORY_CARE_PROVIDER_SITE_OTHER): Payer: BLUE CROSS/BLUE SHIELD | Admitting: Gastroenterology

## 2015-02-08 ENCOUNTER — Other Ambulatory Visit: Payer: Self-pay

## 2015-02-08 VITALS — BP 127/87 | HR 76 | Temp 97.8°F | Ht 61.0 in | Wt 164.0 lb

## 2015-02-08 DIAGNOSIS — K5909 Other constipation: Secondary | ICD-10-CM

## 2015-02-08 NOTE — Progress Notes (Signed)
Gastroenterology Consultation  Referring Provider:     Steele Sizer, MD Primary Care Physician:  Loistine Chance, MD Primary Gastroenterologist:  Dr. Allen Norris     Reason for Consultation:     Constipation       HPI:   Claire [name] is a 44 y.o. y/o female referred for consultation & management of constipation by Dr. Loistine Chance, MD.  This patient comes today with a long history of constipation. The patient reports that prior to that she had normal stools but many years prior to that she had diarrhea. The patient now reports that she went to her primary care provider and was given hematochezia for her constipation and started having 3 days of diarrhea so she stopped it. The patient reports that she was given 24 g of the medication and was told to stop it. She was taking it twice a day at that time. The patient then was told to follow-up with her gastrologist. The patient had a colonoscopy 2 years ago that was uneventful. There is no report of any unexplained weight loss fevers chills nausea vomiting or sign of GI bleeding. The patient states that she has some pain around the right upper quadrant but reports that her gallbladder was taken out.   Past Medical History  Diagnosis Date  . GERD (gastroesophageal reflux disease)   . IBS (irritable bowel syndrome)   . Diabetes mellitus without complication ([place])   . Bilateral leg cramps   . Muscle spasm   . Vitamin D deficiency   . Anemia   . Kidney stone   . Seasonal allergies   . Asthma   . DJD (degenerative joint disease)   . Lung nodules     Past Surgical History  Procedure Laterality Date  . Laparoscopic gastric sleeve resection  2012  . Cholecystectomy  2010  . Bladder surgery  2010  . Plantar fascia surgery Bilateral   . Breast biopsy Right 2014    stereotatic biopsy  . Colonoscopy  2014     Done at [facility]    Prior to Admission medications   Medication Sig Start Date End Date Taking? Authorizing Provider    albuterol (PROVENTIL HFA;VENTOLIN HFA) 108 (90 BASE) MCG/ACT inhaler Inhale 2 puffs into the lungs every 6 (six) hours as needed for wheezing or shortness of breath. 11/05/14  Yes Steele Sizer, MD  ALPRAZolam Duanne Moron) 0.5 MG tablet Take 1 tablet (0.5 mg total) by mouth at bedtime as needed for anxiety. 01/31/15  Yes Steele Sizer, MD  AMITIZA 24 MCG capsule  01/27/15  Yes Historical Provider, MD  Ascorbic Acid (VITAMIN C) 1000 MG tablet Take 1,000 mg by mouth.   Yes Historical Provider, MD  calcium carbonate (OS-CAL) 600 MG TABS tablet Take 600 mg by mouth daily with breakfast.   Yes Historical Provider, MD  Cholecalciferol (VITAMIN D) 1000 UNITS capsule Take 1,000 Units by mouth daily.   Yes Historical Provider, MD  citalopram (CELEXA) 40 MG tablet Take 1 tablet (40 mg total) by mouth daily. 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 01/26/15  Yes Steele Sizer, MD  ferrous sulfate 325 (65 FE) MG EC tablet Take 325 mg by mouth 3 (three) times daily with meals.   Yes Historical Provider, MD  fluconazole (DIFLUCAN) 150 MG tablet  02/06/15  Yes Historical Provider, MD  lansoprazole (PREVACID) 30 MG capsule Take  1 capsule (30 mg total) by mouth 2 (two) times daily before a meal. 11/05/14  Yes Steele Sizer, MD  loratadine (CLARITIN) 10 MG tablet Take 10 mg by mouth daily.   Yes Historical Provider, MD  mometasone (NASONEX) 50 MCG/ACT nasal spray USE 2 SPRAYS IN EACH NOSTRIL AT BEDTIME 12/04/14  Yes Steele Sizer, MD  montelukast (SINGULAIR) 10 MG tablet Take 1 tablet (10 mg total) by mouth at bedtime. 01/31/15  Yes Steele Sizer, MD  Multiple Vitamin (MULTIVITAMIN) tablet Take 1 tablet by mouth daily.   Yes Historical Provider, MD  Potassium Aminobenzoate 500 MG CAPS Take 1 capsule by mouth daily.   Yes Historical Provider, MD  promethazine (PHENERGAN) 12.5 MG tablet TAKE 1 TABLET BY MOUTH EVERY  6 HOURS AS NEEDED FOR MIGRAINES WITH NAUSEA 12/17/14  Yes Steele Sizer, MD  vitamin B-12 (CYANOCOBALAMIN) 100 MCG tablet Take 100 mcg by mouth as directed.    Yes Historical Provider, MD  zolpidem (AMBIEN) 10 MG tablet Must be [employer] 01/31/15  Yes Steele Sizer, MD  zonisamide (ZONEGRAN) 100 MG capsule TK 1 C PO D 11/30/14  Yes Historical Provider, MD    Family History  Problem Relation Age of Onset  . Breast cancer Paternal Aunt   . Breast cancer Paternal Grandmother   . Lung cancer Maternal Grandfather   . Heart attack Mother   . Stroke Mother   . Multiple myeloma Mother   . Hyperlipidemia Father   . Heart attack Sister   . Heart attack Maternal Uncle   . Heart attack Paternal Grandfather      Social History  Substance Use Topics  . Smoking status: Former Smoker -- 1.00 packs/day for 10 years    Types: Cigarettes    Quit date: 02/19/1993  . Smokeless tobacco: Never Used  . Alcohol Use: 0.0 oz/week    0 Standard drinks or equivalent per week     Comment: occasionally    Allergies as of 02/08/2015 - Review Complete 02/08/2015  Allergen Reaction Noted  . Other Hives 06/22/2014  . Vicodin [hydrocodone-acetaminophen] Itching 09/18/2012  . Cymbalta [duloxetine hcl] Palpitations 09/24/2014    Review of Systems:    All systems reviewed and negative except where noted in HPI.   Physical Exam:  BP 127/87 mmHg  Pulse 76  Temp(Src) 97.8 F (36.6 C) (Oral)  Ht _0  (1.549 m)  Wt 164 lb (74.39 kg)  BMI 31.00 kg/m2 No LMP recorded. Psych:  Alert and cooperative. Normal mood and affect. General:   Alert,  Well-developed, well-nourished, pleasant and cooperative in NAD Head:  Normocephalic and atraumatic. Eyes:  Sclera clear, no icterus.   Conjunctiva pink. Ears:  Normal auditory acuity. Nose:  No deformity, discharge, or lesions. Mouth:  No deformity or lesions,oropharynx pink & moist. Neck:  Supple; no masses or thyromegaly. Lungs:  Respirations even and  unlabored.  Clear throughout to auscultation.   No wheezes, crackles, or rhonchi. No acute distress. Heart:  Regular rate and rhythm; no murmurs, clicks, rubs, or gallops. Abdomen:  Normal bowel sounds.  No bruits.  Soft, non-tender and non-distended without masses, hepatosplenomegaly or hernias noted.  No guarding or rebound tenderness.  Negative Carnett sign.   Rectal:  Deferred.  Msk:  Symmetrical without gross deformities.  Good, equal movement & strength bilaterally. Pulses:  Normal pulses noted. Extremities:  No clubbing or edema.  No cyanosis. Neurologic:  Alert and oriented x3;  grossly normal neurologically. Skin:  Intact without significant lesions or rashes.  No jaundice. Lymph Nodes:  No significant cervical adenopathy. Psych:  Alert and cooperative. Normal mood and affect.  Imaging Studies: No results found.  Assessment and Plan:   Claire [name] is a 44 y.o. y/o female comes today with a history of chronic constipation. The patient has not moved her bowels in the last week. The patient was on and hematochezia 24 g twice a day. The patient will be put on 8 g of LFTs once a day and if her diarrhea returned she will stop it if it does not she will titrate up to twice a day. If she has any side effects from this she has also been given samples of 145 mg of Amitiza to try if he has to. If she continues to have problems she will contact this office. The patient has been explained the plan and agrees with it.   Note: This dictation was prepared with Dragon dictation along with smaller phrase technology. Any transcriptional errors that result from this process are unintentional.

## 2015-02-09 ENCOUNTER — Ambulatory Visit: Payer: BLUE CROSS/BLUE SHIELD | Admitting: Gastroenterology

## 2015-02-17 ENCOUNTER — Other Ambulatory Visit: Payer: Self-pay | Admitting: Family Medicine

## 2015-02-18 NOTE — Telephone Encounter (Signed)
Patient requesting refill. 

## 2015-03-10 ENCOUNTER — Telehealth: Payer: Self-pay | Admitting: Family Medicine

## 2015-03-10 ENCOUNTER — Ambulatory Visit: Payer: BLUE CROSS/BLUE SHIELD | Admitting: Gastroenterology

## 2015-03-10 DIAGNOSIS — Z20818 Contact with and (suspected) exposure to other bacterial communicable diseases: Secondary | ICD-10-CM

## 2015-03-10 MED ORDER — AMOXICILLIN 500 MG PO CAPS: 500.0000 mg | ORAL_CAPSULE | Freq: Three times a day (TID) | ORAL | Status: DC

## 2015-03-10 NOTE — Telephone Encounter (Signed)
Husband was dx with strep throat about 3 days ago. Yesterday morning she woke up and felt like dry throat now she is in severe pain. Ran fever last night and early this morning. Would like to know if she need to come in or if you could just call her in something to [facility]

## 2015-03-10 NOTE — Telephone Encounter (Signed)
Called medication in

## 2015-05-02 ENCOUNTER — Ambulatory Visit (INDEPENDENT_AMBULATORY_CARE_PROVIDER_SITE_OTHER): Payer: BLUE CROSS/BLUE SHIELD | Admitting: Family Medicine

## 2015-05-02 ENCOUNTER — Encounter: Payer: Self-pay | Admitting: Family Medicine

## 2015-05-02 VITALS — BP 116/66 | HR 82 | Temp 98.0°F | Resp 18 | Ht 61.0 in | Wt 176.5 lb

## 2015-05-02 DIAGNOSIS — K219 Gastro-esophageal reflux disease without esophagitis: Secondary | ICD-10-CM

## 2015-05-02 DIAGNOSIS — M545 Low back pain, unspecified: Secondary | ICD-10-CM

## 2015-05-02 DIAGNOSIS — Z9884 Bariatric surgery status: Secondary | ICD-10-CM

## 2015-05-02 DIAGNOSIS — G4700 Insomnia, unspecified: Secondary | ICD-10-CM

## 2015-05-02 DIAGNOSIS — J3089 Other allergic rhinitis: Secondary | ICD-10-CM

## 2015-05-02 DIAGNOSIS — J309 Allergic rhinitis, unspecified: Secondary | ICD-10-CM | POA: Diagnosis not present

## 2015-05-02 DIAGNOSIS — R918 Other nonspecific abnormal finding of lung field: Secondary | ICD-10-CM | POA: Diagnosis not present

## 2015-05-02 DIAGNOSIS — N760 Acute vaginitis: Secondary | ICD-10-CM

## 2015-05-02 DIAGNOSIS — J4530 Mild persistent asthma, uncomplicated: Secondary | ICD-10-CM

## 2015-05-02 DIAGNOSIS — K589 Irritable bowel syndrome without diarrhea: Secondary | ICD-10-CM

## 2015-05-02 DIAGNOSIS — F418 Other specified anxiety disorders: Secondary | ICD-10-CM | POA: Diagnosis not present

## 2015-05-02 DIAGNOSIS — M5489 Other dorsalgia: Secondary | ICD-10-CM | POA: Diagnosis not present

## 2015-05-02 DIAGNOSIS — F5104 Psychophysiologic insomnia: Secondary | ICD-10-CM

## 2015-05-02 MED ORDER — MOMETASONE FUROATE 50 MCG/ACT NA SUSP: NASAL | Status: DC

## 2015-05-02 MED ORDER — LANSOPRAZOLE 30 MG PO CPDR: 30.0000 mg | DELAYED_RELEASE_CAPSULE | Freq: Two times a day (BID) | ORAL | Status: DC

## 2015-05-02 MED ORDER — FLUCONAZOLE 150 MG PO TABS
150.0000 mg | ORAL_TABLET | Freq: Every day | ORAL | Status: DC | PRN
Start: 2015-05-02 — End: 2015-08-08

## 2015-05-02 MED ORDER — ALPRAZOLAM 0.5 MG PO TABS: 0.5000 mg | ORAL_TABLET | Freq: Every evening | ORAL | Status: DC | PRN

## 2015-05-02 MED ORDER — ZOLPIDEM TARTRATE 10 MG PO TABS: ORAL_TABLET | ORAL | Status: DC

## 2015-05-02 NOTE — Progress Notes (Signed)
Name: Claire [name]   MRN: [id number]    DOB: 1971-02-08   Date:05/02/2015       Progress Note  Subjective  Chief Complaint  Chief Complaint  Patient presents with  . Medication Refill    3 month F/U  . Migraine    Better than they have been, seeing Neurologist-keeping her on the same regimen.  . Allergic Rhinitis     Well controlled with Loratadine   . Depression    Well controlled  . Gastroesophageal Reflux    Stable  . Constipation    A little better, patient is trying to increase water intake. Going to GI has tried Amitiza and Linzess but can't tell a big difference with either.   . Asthma    SOB, but states it has been flaired up recently, maybe due to weather?    HPI  IBS : she has a history of IBS, but years ago was more diarrhea type, recently more constipation symptoms, with bloating, passing a lot a gas, seen by Dr. Durwin Reges and is taking a smaller dose of Amitiza to try and Linzess samples - but she has not noticed a difference between the tow or improvement of symptoms. She has abdominal cramping before bowel movements. Pain is worse on RUQ and radiates to her back ( history of cholecystectomy ) Occasionally she has some associated nausea with cramping sensation.  Back pain: she states back pain is daily but stable, worse at the end of the day.  She has aching on lower back and buttocks, pain right now is 2/10 - but she was resting most of the day. No tingling or numbness. 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 She is now on Flexeril and Tylenol   Depression/Anxiety: she is now on Citalopram 40 mg and symptoms are better controlled. Still overwhelmed with work, raising a family . She states the kids are helping out more at home, no new stress. She states her motivation has gone down again, feeling tired all the time - she will try resuming daily exercise routine to see if symptoms improve.  She has no orgasm with  Citalopram but does not want to change medication at this time.   Insomnia: taking medication still wakes up at 2 to 3 am but able to fall back asleep.  Still taking Ambien. She states not drinking anything before bed time has helped.   GERD: she needs refill of Prevacid it has to [employer] brand or it does not work for her. No heartburn, but still has regurgitation.   Asthma Mild Persistent :symptoms are under controlled at this time, using rescue inhaler very seldom, but still has a cough a couple of times of week , no wheezing, she has noticed some  SOB - since she has been more sedentary and is gaining weight. She takes Singulair daily also  AR: doing well on medication, she continues to have some right ear pressure, but improving since had braces placed in January 2017. She denies rhinorrhea, or nasal congestion  Migraine: she is still seeing Dr. Domingo Cocking - now off Topamax and is on Zonegran, frequency has gone down from 16 a month down to 6 per month usually around her cycle. However it is causing some weight gain.    Patient Active Problem List   Diagnosis Date Noted  . Lumbosacral pain 01/07/2015  . Muscle twitching 01/07/2015  . Intertrigo 11/05/2014  . Perennial allergic  rhinitis 09/03/2014  . Lung nodules 09/03/2014  . Vitamin D deficiency 09/03/2014  . History of diabetes mellitus 09/03/2014  . History of kidney stones 09/03/2014  . Chronic insomnia 09/03/2014  . History of iron deficiency anemia 09/03/2014  . Depression with anxiety 09/03/2014  . Palpitations 06/22/2014  . History of bariatric surgery 12/03/2013  . Body mass index 27.0-27.9, adult 12/03/2013  . Asthma, mild persistent 11/21/2012  . CN (constipation) 11/21/2012  . GERD without esophagitis 11/21/2012  . History of hyperlipidemia 11/21/2012  . Migraine with aura without status migrainosus 11/21/2012  . History of sleep apnea 11/21/2012  . Morbid obesity ([place]) 11/21/2012  . IBS (irritable bowel syndrome)  08/15/2012    Past Surgical History  Procedure Laterality Date  . Laparoscopic gastric sleeve resection  2012  . Cholecystectomy  2010  . Bladder surgery  2010  . Plantar fascia surgery Bilateral   . Breast biopsy Right 2014    stereotatic biopsy  . Colonoscopy  2014     Done at [facility] History  Problem Relation Age of Onset  . Breast cancer Paternal Aunt   . Breast cancer Paternal Grandmother   . Lung cancer Maternal Grandfather   . Heart attack Mother   . Stroke Mother   . Multiple myeloma Mother   . Hyperlipidemia Father   . Heart attack Sister   . Heart attack Maternal Uncle   . Heart attack Paternal Grandfather     Social History   Social History  . Marital Status: Married    Spouse Name: N/A  . Number of Children: N/A  . Years of Education: N/A   Occupational History  . Not on file.   Social History Main Topics  . Smoking status: Former Smoker -- 1.00 packs/day for 10 years    Types: Cigarettes    Quit date: 02/19/1993  . Smokeless tobacco: Never Used  . Alcohol Use: 0.0 oz/week    0 Standard drinks or equivalent per week     Comment: occasionally  . Drug Use: No  . Sexual Activity:    Partners: Male   Other Topics Concern  . Not on file   Social History Narrative     Current outpatient prescriptions:  .  albuterol (PROVENTIL HFA;VENTOLIN HFA) 108 (90 BASE) MCG/ACT inhaler, Inhale 2 puffs into the lungs every 6 (six) hours as needed for wheezing or shortness of breath., Disp: 3 Inhaler, Rfl: 0 .  ALPRAZolam (XANAX) 0.5 MG tablet, Take 1 tablet (0.5 mg total) by mouth at bedtime as needed for anxiety., Disp: 90 tablet, Rfl: 0 .  Ascorbic Acid (VITAMIN C) 1000 MG tablet, Take 1,000 mg by mouth., Disp: , Rfl:  .  calcium carbonate (OS-CAL) 600 MG TABS tablet, Take 600 mg by mouth daily with breakfast., Disp: , Rfl:  .  Cholecalciferol (VITAMIN D) 1000 UNITS capsule, Take 1,000 Units by mouth daily., Disp: , Rfl:  .  citalopram (CELEXA) 40  MG tablet, Take 1 tablet (40 mg total) by mouth daily. 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  cyclobenzaprine (FLEXERIL) 10 MG tablet, TAKE 1 TABLET(10 MG) BY MOUTH AT BEDTIME, Disp: 30 tablet, Rfl: 2 .  ferrous sulfate 325 (65 FE) MG EC tablet, Take 325 mg by mouth 3 (three) times daily with meals., Disp: , Rfl:  .  fluconazole (DIFLUCAN) 150 MG tablet, Take 1 tablet (150 mg total) by mouth daily as needed., Disp: 9 tablet, Rfl: 0 .  lansoprazole (  PREVACID) 30 MG capsule, Take 1 capsule (30 mg total) by mouth 2 (two) times daily before a meal., Disp: 180 capsule, Rfl: 1 .  loratadine (CLARITIN) 10 MG tablet, Take 10 mg by mouth daily., Disp: , Rfl:  .  mometasone (NASONEX) 50 MCG/ACT nasal spray, USE 2 SPRAYS IN EACH NOSTRIL AT BEDTIME, Disp: 51 g, Rfl: 1 .  montelukast (SINGULAIR) 10 MG tablet, Take 1 tablet (10 mg total) by mouth at bedtime., Disp: 90 tablet, Rfl: 1 .  Multiple Vitamin (MULTIVITAMIN) tablet, Take 1 tablet by mouth daily., Disp: , Rfl:  .  Potassium Aminobenzoate 500 MG CAPS, Take 1 capsule by mouth daily., Disp: , Rfl:  .  promethazine (PHENERGAN) 12.5 MG tablet, TAKE 1 TABLET BY MOUTH EVERY 6 HOURS AS NEEDED FOR MIGRAINES WITH NAUSEA, Disp: 20 tablet, Rfl: 2 .  vitamin B-12 (CYANOCOBALAMIN) 100 MCG tablet, Take 100 mcg by mouth as directed. , Disp: , Rfl:  .  zolpidem (AMBIEN) 10 MG tablet, Must be [employer], Disp: 90 tablet, Rfl: 0 .  zonisamide (ZONEGRAN) 100 MG capsule, Take 1 capsule by mouth every evening., Disp: , Rfl: 1  Allergies  Allergen Reactions  . Other Hives    Ricotta Cheese: flushed and hot  . Vicodin [Hydrocodone-Acetaminophen] Itching  . Cymbalta [Duloxetine Hcl] Palpitations    And photosensitivity     ROS  Ten systems reviewed and is negative except as mentioned in HPI   Objective  Filed Vitals:   05/02/15 1629  BP: 116/66  Pulse: 82  Temp: 98 F (36.7 C)  TempSrc: Oral  Resp: 18  Height: 5' 1"  (1.549 m)  Weight:  176 lb 8 oz (80.06 kg)  SpO2: 94%    Body mass index is 33.37 kg/(m^2).  Physical Exam  Constitutional: Patient appears well-developed and well-nourished. Obese No distress.  HEENT: head atraumatic, normocephalic, pupils equal and reactive to light,  neck supple, throat within normal limits Cardiovascular: Normal rate, regular rhythm and normal heart sounds.  No murmur heard. No BLE edema. Pulmonary/Chest: Effort normal and breath sounds normal. No respiratory distress. Abdominal: Soft.  There is no tenderness. Psychiatric: Patient has a normal mood and affect. behavior is normal. Judgment and thought content normal. Muscular Skeletal: mild low back pain, negative straight leg raise  PHQ2/9: Depression screen [facility] 2/9 05/02/2015 01/07/2015 11/05/2014 09/03/2014  Decreased Interest 0 0 1 0  Down, Depressed, Hopeless 0 0 1 0  PHQ - 2 Score 0 0 2 0  Altered sleeping - - 3 -  Tired, decreased energy - - 3 -  Change in appetite - - 2 -  Feeling bad or failure about yourself  - - 0 -  Trouble concentrating - - 3 -  Moving slowly or fidgety/restless - - 1 -  Suicidal thoughts - - 0 -  PHQ-9 Score - - 14 -  Difficult doing work/chores - - Not difficult at all -     Fall Risk: Fall Risk  05/02/2015 01/07/2015 11/05/2014 09/03/2014  Falls in the past year? No No No No      Functional Status Survey: Is the patient deaf or have difficulty hearing?: No Does the patient have difficulty seeing, even when wearing glasses/contacts?: No Does the patient have difficulty concentrating, remembering, or making decisions?: No Does the patient have difficulty walking or climbing stairs?: No Does the patient have difficulty dressing or bathing?: No Does the patient have difficulty doing errands alone such as visiting a doctor's office or shopping?: No  Assessment & Plan  1. Asthma, mild persistent, uncomplicated  Continue prn inhaler and singulair daily   2. IBS (irritable bowel  syndrome)  Did noticed improvement with Amitiza or Linzess  3. GERD without esophagitis  - lansoprazole (PREVACID) 30 MG capsule; Take 1 capsule (30 mg total) by mouth 2 (two) times daily before a meal.  Dispense: 180 capsule; Refill: 1  4. Chronic insomnia  - zolpidem (AMBIEN) 10 MG tablet; Must be [employer]  Dispense: 90 tablet; Refill: 0  5. Depression with anxiety  - ALPRAZolam (XANAX) 0.5 MG tablet; Take 1 tablet (0.5 mg total) by mouth at bedtime as needed for anxiety.  Dispense: 90 tablet; Refill: 0  6. Perennial allergic rhinitis  - mometasone (NASONEX) 50 MCG/ACT nasal spray; USE 2 SPRAYS IN EACH NOSTRIL AT BEDTIME  Dispense: 51 g; Refill: 1  7. History of bariatric surgery  Recheck labs next visit  8. Lumbosacral pain  Stable, continue muscle relaxer and Tylenol   9. Lung nodules  Found in 2015, she used to smoke we will repeat test - CT CHEST LUNG CA SCREEN LOW DOSE W/O CM; Future  10. Recurrent vaginitis  - fluconazole (DIFLUCAN) 150 MG tablet; Take 1 tablet (150 mg total) by mouth daily as needed.  Dispense: 9 tablet; Refill: 0

## 2015-05-05 ENCOUNTER — Telehealth: Payer: Self-pay

## 2015-05-05 NOTE — Telephone Encounter (Signed)
Pharmacist called to make sure the zolpidem was [employer]eva brand only

## 2015-05-19 ENCOUNTER — Other Ambulatory Visit: Payer: Self-pay | Admitting: Obstetrics and Gynecology

## 2015-06-06 NOTE — Patient Instructions (Addendum)
Your procedure is scheduled on:  Thursday, June 16, 2015  Enter through the Main Entrance of [facility] at: 6:00 AM  Pick up the phone at the desk and dial [phone].  Call this number if you have problems the morning of surgery: [phone].  Remember:  Do NOT eat food or drink after:  Midnight Wednesday, June 15, 2015  Take these medicines the morning of surgery with a SIP OF WATER:  Prevacid, Potassium  Bring Asthma Inhaler day of surgery  Do NOT wear jewelry (body piercing), metal hair clips/bobby pins, make-up, or nail polish. Do NOT wear lotions, powders, or perfumes.  You may wear deodorant. Do NOT shave for 48 hours prior to surgery. Do NOT bring valuables to the hospital. Contacts, dentures, or bridgework may not be worn into surgery.  Have a responsible adult drive you home and stay with you for 24 hours after your procedure

## 2015-06-07 ENCOUNTER — Encounter (HOSPITAL_COMMUNITY): Payer: Self-pay

## 2015-06-07 ENCOUNTER — Encounter (HOSPITAL_COMMUNITY)
Admission: RE | Admit: 2015-06-07 | Discharge: 2015-06-07 | Disposition: A | Discharge status: Home / Self Care | Payer: BLUE CROSS/BLUE SHIELD | Source: Ambulatory Visit | Attending: Obstetrics and Gynecology | Admitting: Obstetrics and Gynecology

## 2015-06-07 DIAGNOSIS — R1031 Right lower quadrant pain: Secondary | ICD-10-CM | POA: Diagnosis not present

## 2015-06-07 DIAGNOSIS — Z01812 Encounter for preprocedural laboratory examination: Secondary | ICD-10-CM | POA: Diagnosis present

## 2015-06-07 DIAGNOSIS — N920 Excessive and frequent menstruation with regular cycle: Secondary | ICD-10-CM | POA: Diagnosis not present

## 2015-06-07 HISTORY — DX: Sleep apnea, unspecified: G47.30

## 2015-06-07 HISTORY — DX: Headache, unspecified: R51.9

## 2015-06-07 HISTORY — DX: Anxiety disorder, unspecified: F41.9

## 2015-06-07 HISTORY — DX: Depression, unspecified: F32.A

## 2015-06-07 HISTORY — DX: Headache: R51

## 2015-06-07 HISTORY — DX: Psoriasis, unspecified: L40.9

## 2015-06-07 HISTORY — DX: Major depressive disorder, single episode, unspecified: F32.9

## 2015-06-07 LAB — CBC
HCT: 41.1 % (ref 36.0–46.0)
Hemoglobin: 14 g/dL (ref 12.0–15.0)
MCH: 29.8 pg (ref 26.0–34.0)
MCHC: 34.1 g/dL (ref 30.0–36.0)
MCV: 87.4 fL (ref 78.0–100.0)
PLATELETS: 345 10*3/uL (ref 150–400)
RBC: 4.7 MIL/uL (ref 3.87–5.11)
RDW: 13.3 % (ref 11.5–15.5)
WBC: 9.1 10*3/uL (ref 4.0–10.5)

## 2015-06-14 NOTE — Anesthesia Preprocedure Evaluation (Addendum)
Anesthesia Evaluation  Patient identified by MRN, date of birth, ID band Patient awake    Reviewed: Allergy & Precautions, NPO status , Patient's Chart, lab work & pertinent test results  Airway Mallampati: I       Dental no notable dental hx.    Pulmonary asthma , neg sleep apnea, former smoker,    Pulmonary exam normal        Cardiovascular negative cardio ROS Normal cardiovascular exam     Neuro/Psych  Headaches, PSYCHIATRIC DISORDERS Anxiety Depression    GI/Hepatic Neg liver ROS, GERD  Medicated,  Endo/Other  diabetes  Renal/GU Renal disease  negative genitourinary   Musculoskeletal  (+) Arthritis ,   Abdominal Normal abdominal exam  (+)   Peds negative pediatric ROS (+)  Hematology   Anesthesia Other Findings   Reproductive/Obstetrics negative OB ROS                            Lab Results  Component Value Date   WBC 9.1 06/07/2015   HGB 14.0 06/07/2015   HCT 41.1 06/07/2015   MCV 87.4 06/07/2015   PLT 345 06/07/2015   Lab Results  Component Value Date   CREATININE 0.63 01/07/2015   BUN 11 01/07/2015   NA 139 01/07/2015   K 4.3 01/07/2015   CL 101 01/07/2015   CO2 26 01/07/2015   No results found for: INR, PROTIME  06/2014 EKG: normal EKG, normal sinus rhythm.  06/2014 Stress Test  There was no ST segment deviation noted during stress.  Normal treadmill stress test  Anesthesia Physical Anesthesia Plan  ASA: II  Anesthesia Plan: General   Post-op Pain Management:    Induction: Intravenous  Airway Management Planned: Oral ETT  Additional Equipment:   Intra-op Plan:   Post-operative Plan: Extubation in OR  Informed Consent: I have reviewed the patients History and Physical, chart, labs and discussed the procedure including the risks, benefits and alternatives for the proposed anesthesia with the patient or authorized representative who has indicated  his/her understanding and acceptance.     Plan Discussed with: CRNA and Surgeon  Anesthesia Plan Comments:        Anesthesia Quick Evaluation

## 2015-06-15 NOTE — H&P (Signed)
NAMEDEVELYN, BJELLAND NO.:  [id number]  MEDICAL RECORD NO.:  [id number]  LOCATION:  [facility]                         FACILITY:  [facility]  PHYSICIAN:  Lovenia Kim, M.D.DATE OF BIRTH:  1970/08/14  DATE OF ADMISSION:  05/02/2015 DATE OF DISCHARGE:                             HISTORY & PHYSICAL   CHIEF COMPLAINT:  Menorrhagia and right lower quadrant pain.  HISTORY OF PRESENT ILLNESS:  A 45 year old white female, G3, P3, for definitive therapy.  PAST SURGICAL HISTORY:  Remarkable for gastric sleeve, TVT, anterior and posterior colporrhaphy with perineorrhaphy, sinus surgery, plantar fascia surgery.  ALLERGIES:  Adhesive and food allergies.  FAMILY HISTORY:  Stroke, hypertension, thyroid dysfunction, lung cancer, diabetes, and migraine.  MEDICATIONS:  Ambien as needed, iron, vitamin C, promethazine, citalopram, loratadine, and Prevacid.  PHYSICAL EXAMINATION:  GENERAL:  A well-developed, well-nourished, white female, in no acute distress. HEENT:  Normal. NECK:  Supple.  Full range of motion. LUNGS:  Clear. HEART:  Regular rate and rhythm. ABDOMEN:  Soft, nontender. PELVIC EXAM:  Reveals a normal size uterus with right adnexal tenderness. EXTREMITIES:  There are no cords. NEUROLOGIC:  Nonfocal. SKIN:  Intact.  IMPRESSION: 1. Persistent right lower quadrant pain with negative GI and GU     workup. 2. Menorrhagia.  PLAN:  Plan is to proceed with diagnostic laparoscopy, possible da Vinci- assisted right salpingo-oophorectomy, diagnostic hysteroscopy, D and C, NovaSure endometrial ablation.  Risks of anesthesia, infection, bleeding, infection, possible need for repair was discussed.  Delayed versus immediate complications to include bowel and bladder injury noted inability to cure pain discussed.  The patient acknowledges and wishes to proceed.     Lovenia Kim, M.D.     RJT/MEDQ  D:  06/15/2015  T:  06/15/2015  Job:  [id number]

## 2015-06-16 ENCOUNTER — Ambulatory Visit (HOSPITAL_COMMUNITY)
Admission: RE | Admit: 2015-06-16 | Discharge: 2015-06-16 | Disposition: A | Discharge status: Home / Self Care | Payer: BLUE CROSS/BLUE SHIELD | Source: Ambulatory Visit | Attending: Obstetrics and Gynecology | Admitting: Obstetrics and Gynecology

## 2015-06-16 ENCOUNTER — Encounter (HOSPITAL_COMMUNITY)
Admission: RE | Disposition: A | Discharge status: Home / Self Care | Payer: Self-pay | Source: Ambulatory Visit | Attending: Obstetrics and Gynecology

## 2015-06-16 ENCOUNTER — Encounter (HOSPITAL_COMMUNITY): Payer: Self-pay | Admitting: Anesthesiology

## 2015-06-16 ENCOUNTER — Ambulatory Visit (HOSPITAL_COMMUNITY): Payer: BLUE CROSS/BLUE SHIELD | Admitting: Anesthesiology

## 2015-06-16 DIAGNOSIS — F419 Anxiety disorder, unspecified: Secondary | ICD-10-CM | POA: Diagnosis not present

## 2015-06-16 DIAGNOSIS — Z87891 Personal history of nicotine dependence: Secondary | ICD-10-CM | POA: Diagnosis not present

## 2015-06-16 DIAGNOSIS — N920 Excessive and frequent menstruation with regular cycle: Secondary | ICD-10-CM | POA: Insufficient documentation

## 2015-06-16 DIAGNOSIS — F329 Major depressive disorder, single episode, unspecified: Secondary | ICD-10-CM | POA: Diagnosis not present

## 2015-06-16 DIAGNOSIS — J45909 Unspecified asthma, uncomplicated: Secondary | ICD-10-CM | POA: Insufficient documentation

## 2015-06-16 DIAGNOSIS — M1990 Unspecified osteoarthritis, unspecified site: Secondary | ICD-10-CM | POA: Insufficient documentation

## 2015-06-16 DIAGNOSIS — R1031 Right lower quadrant pain: Secondary | ICD-10-CM | POA: Diagnosis not present

## 2015-06-16 DIAGNOSIS — Z9884 Bariatric surgery status: Secondary | ICD-10-CM | POA: Insufficient documentation

## 2015-06-16 DIAGNOSIS — Z79899 Other long term (current) drug therapy: Secondary | ICD-10-CM | POA: Insufficient documentation

## 2015-06-16 DIAGNOSIS — N8301 Follicular cyst of right ovary: Secondary | ICD-10-CM | POA: Diagnosis not present

## 2015-06-16 DIAGNOSIS — K219 Gastro-esophageal reflux disease without esophagitis: Secondary | ICD-10-CM | POA: Insufficient documentation

## 2015-06-16 HISTORY — PX: LYSIS OF ADHESION: SHX5961

## 2015-06-16 HISTORY — PX: DILITATION & CURRETTAGE/HYSTROSCOPY WITH NOVASURE ABLATION: SHX5568

## 2015-06-16 HISTORY — PX: LAPAROSCOPY: SHX197

## 2015-06-16 HISTORY — PX: ROBOTIC ASSISTED SALPINGO OOPHERECTOMY: SHX6082

## 2015-06-16 SURGERY — LAPAROSCOPY, DIAGNOSTIC
Anesthesia: General | Site: Vagina | Laterality: Right

## 2015-06-16 MED ORDER — VASOPRESSIN 20 UNIT/ML IV SOLN
INTRAVENOUS | Status: AC
  Filled 2015-06-16: qty 1

## 2015-06-16 MED ORDER — KETOROLAC TROMETHAMINE 30 MG/ML IJ SOLN
INTRAMUSCULAR | Status: AC
  Filled 2015-06-16: qty 1

## 2015-06-16 MED ORDER — ROPIVACAINE HCL 5 MG/ML IJ SOLN
INTRAMUSCULAR | Status: AC
Start: 2015-06-16 — End: 2015-06-16
  Filled 2015-06-16: qty 30

## 2015-06-16 MED ORDER — MEPERIDINE HCL 25 MG/ML IJ SOLN
6.2500 mg | INTRAMUSCULAR | Status: DC | PRN
  Administered 2015-06-16: 12.5 mg via INTRAVENOUS

## 2015-06-16 MED ORDER — HEPARIN SODIUM (PORCINE) 5000 UNIT/ML IJ SOLN
INTRAMUSCULAR | Status: AC
  Filled 2015-06-16: qty 1

## 2015-06-16 MED ORDER — MIDAZOLAM HCL 2 MG/2ML IJ SOLN
INTRAMUSCULAR | Status: DC | PRN
  Administered 2015-06-16: 1 mg via INTRAVENOUS

## 2015-06-16 MED ORDER — SODIUM CHLORIDE 0.9 % IJ SOLN
INTRAMUSCULAR | Status: AC
  Filled 2015-06-16: qty 50

## 2015-06-16 MED ORDER — ROCURONIUM BROMIDE 100 MG/10ML IV SOLN
INTRAVENOUS | Status: AC
  Filled 2015-06-16: qty 1

## 2015-06-16 MED ORDER — LACTATED RINGERS IR SOLN
Status: DC | PRN
  Administered 2015-06-16: 3000 mL

## 2015-06-16 MED ORDER — KETOROLAC TROMETHAMINE 30 MG/ML IJ SOLN
INTRAMUSCULAR | Status: DC | PRN
  Administered 2015-06-16: 30 mg via INTRAVENOUS

## 2015-06-16 MED ORDER — SCOPOLAMINE 1 MG/3DAYS TD PT72
MEDICATED_PATCH | TRANSDERMAL | Status: AC
  Administered 2015-06-16: 1.5 mg via TRANSDERMAL
  Filled 2015-06-16: qty 1

## 2015-06-16 MED ORDER — OXYCODONE-ACETAMINOPHEN 5-325 MG PO TABS: 1.0000 | ORAL_TABLET | ORAL | Status: DC | PRN

## 2015-06-16 MED ORDER — PROPOFOL 10 MG/ML IV BOLUS
INTRAVENOUS | Status: DC | PRN
  Administered 2015-06-16: 180 mg via INTRAVENOUS

## 2015-06-16 MED ORDER — HYDROMORPHONE HCL 1 MG/ML IJ SOLN
0.2500 mg | INTRAMUSCULAR | Status: DC | PRN
  Administered 2015-06-16 (×2): 0.5 mg via INTRAVENOUS

## 2015-06-16 MED ORDER — LIDOCAINE HCL (CARDIAC) 20 MG/ML IV SOLN
INTRAVENOUS | Status: DC | PRN
  Administered 2015-06-16: 70 mg via INTRAVENOUS
  Administered 2015-06-16: 30 mg via INTRAVENOUS

## 2015-06-16 MED ORDER — BUPIVACAINE HCL (PF) 0.25 % IJ SOLN
INTRAMUSCULAR | Status: AC
Start: 2015-06-16 — End: 2015-06-16
  Filled 2015-06-16: qty 30

## 2015-06-16 MED ORDER — DIPHENHYDRAMINE HCL 50 MG/ML IJ SOLN
INTRAMUSCULAR | Status: DC | PRN
  Administered 2015-06-16: 12.5 mg via INTRAVENOUS

## 2015-06-16 MED ORDER — SODIUM CHLORIDE 0.9 % IJ SOLN
INTRAMUSCULAR | Status: AC
Start: 2015-06-16 — End: 2015-06-16
  Filled 2015-06-16: qty 20

## 2015-06-16 MED ORDER — DIPHENHYDRAMINE HCL 50 MG/ML IJ SOLN
INTRAMUSCULAR | Status: AC
  Filled 2015-06-16: qty 1

## 2015-06-16 MED ORDER — PROCHLORPERAZINE EDISYLATE 5 MG/ML IJ SOLN: 10.0000 mg | INTRAMUSCULAR | Status: DC | PRN

## 2015-06-16 MED ORDER — ONDANSETRON HCL 4 MG/2ML IJ SOLN
INTRAMUSCULAR | Status: DC | PRN
  Administered 2015-06-16: 4 mg via INTRAVENOUS

## 2015-06-16 MED ORDER — FENTANYL CITRATE (PF) 250 MCG/5ML IJ SOLN
INTRAMUSCULAR | Status: AC
  Filled 2015-06-16: qty 5

## 2015-06-16 MED ORDER — LIDOCAINE HCL (CARDIAC) 20 MG/ML IV SOLN
INTRAVENOUS | Status: AC
  Filled 2015-06-16: qty 5

## 2015-06-16 MED ORDER — DEXAMETHASONE SODIUM PHOSPHATE 4 MG/ML IJ SOLN
INTRAMUSCULAR | Status: AC
  Filled 2015-06-16: qty 1

## 2015-06-16 MED ORDER — BUPIVACAINE HCL (PF) 0.25 % IJ SOLN
INTRAMUSCULAR | Status: DC | PRN
  Administered 2015-06-16: 30 mL

## 2015-06-16 MED ORDER — CEFAZOLIN SODIUM-DEXTROSE 2-4 GM/100ML-% IV SOLN
2.0000 g | INTRAVENOUS | Status: AC
  Administered 2015-06-16: 2 g via INTRAVENOUS
  Filled 2015-06-16: qty 100

## 2015-06-16 MED ORDER — CEFAZOLIN SODIUM-DEXTROSE 2-3 GM-% IV SOLR
INTRAVENOUS | Status: AC
  Filled 2015-06-16: qty 50

## 2015-06-16 MED ORDER — HYDROMORPHONE HCL 1 MG/ML IJ SOLN
INTRAMUSCULAR | Status: AC
  Administered 2015-06-16: 0.5 mg via INTRAVENOUS
  Filled 2015-06-16: qty 1

## 2015-06-16 MED ORDER — SCOPOLAMINE 1 MG/3DAYS TD PT72
1.0000 | MEDICATED_PATCH | Freq: Once | TRANSDERMAL | Status: DC
  Administered 2015-06-16: 1.5 mg via TRANSDERMAL

## 2015-06-16 MED ORDER — ALBUTEROL SULFATE HFA 108 (90 BASE) MCG/ACT IN AERS
INHALATION_SPRAY | RESPIRATORY_TRACT | Status: DC | PRN
  Administered 2015-06-16: 2 via RESPIRATORY_TRACT

## 2015-06-16 MED ORDER — SODIUM CHLORIDE 0.9 % IJ SOLN
INTRAMUSCULAR | Status: DC | PRN
  Administered 2015-06-16: 10 mL via INTRAVENOUS

## 2015-06-16 MED ORDER — LACTATED RINGERS IV SOLN: INTRAVENOUS | Status: DC

## 2015-06-16 MED ORDER — MEPERIDINE HCL 25 MG/ML IJ SOLN
INTRAMUSCULAR | Status: AC
  Filled 2015-06-16: qty 1

## 2015-06-16 MED ORDER — ROCURONIUM BROMIDE 100 MG/10ML IV SOLN
INTRAVENOUS | Status: DC | PRN
  Administered 2015-06-16: 40 mg via INTRAVENOUS
  Administered 2015-06-16: 20 mg via INTRAVENOUS

## 2015-06-16 MED ORDER — PROPOFOL 10 MG/ML IV BOLUS
INTRAVENOUS | Status: AC
  Filled 2015-06-16: qty 20

## 2015-06-16 MED ORDER — SODIUM CHLORIDE 0.9 % IJ SOLN
INTRAMUSCULAR | Status: AC
Start: 2015-06-16 — End: 2015-06-16
  Filled 2015-06-16: qty 50

## 2015-06-16 MED ORDER — MIDAZOLAM HCL 2 MG/2ML IJ SOLN
INTRAMUSCULAR | Status: AC
  Filled 2015-06-16: qty 2

## 2015-06-16 MED ORDER — ONDANSETRON HCL 4 MG/2ML IJ SOLN
INTRAMUSCULAR | Status: AC
  Filled 2015-06-16: qty 2

## 2015-06-16 MED ORDER — FENTANYL CITRATE (PF) 100 MCG/2ML IJ SOLN
INTRAMUSCULAR | Status: DC | PRN
  Administered 2015-06-16: 50 ug via INTRAVENOUS
  Administered 2015-06-16: 25 ug via INTRAVENOUS
  Administered 2015-06-16 (×2): 50 ug via INTRAVENOUS
  Administered 2015-06-16: 75 ug via INTRAVENOUS

## 2015-06-16 MED ORDER — SUGAMMADEX SODIUM 200 MG/2ML IV SOLN
INTRAVENOUS | Status: DC | PRN
  Administered 2015-06-16: 157.4 mg via INTRAVENOUS

## 2015-06-16 MED ORDER — DEXAMETHASONE SODIUM PHOSPHATE 10 MG/ML IJ SOLN
INTRAMUSCULAR | Status: DC | PRN
  Administered 2015-06-16: 4 mg via INTRAVENOUS

## 2015-06-16 MED ORDER — LACTATED RINGERS IV SOLN
INTRAVENOUS | Status: DC
  Administered 2015-06-16 (×2): via INTRAVENOUS

## 2015-06-16 SURGICAL SUPPLY — 76 items
ABLATOR ENDOMETRIAL BIPOLAR (ABLATOR) ×5 IMPLANT
BARRIER ADHS 3X4 INTERCEED (GAUZE/BANDAGES/DRESSINGS) IMPLANT
CABLE HIGH FREQUENCY MONO STRZ (ELECTRODE) IMPLANT
CATH ROBINSON RED A/P 16FR (CATHETERS) IMPLANT
CHLORAPREP W/TINT 26ML (MISCELLANEOUS) ×5 IMPLANT
CLOTH BEACON ORANGE TIMEOUT ST (SAFETY) ×5 IMPLANT
CONT PATH 16OZ SNAP LID 3702 (MISCELLANEOUS) ×5 IMPLANT
CONTAINER PREFILL 10% NBF 60ML (FORM) ×5 IMPLANT
COVER BACK TABLE 60X90IN (DRAPES) ×10 IMPLANT
COVER TIP SHEARS 8 DVNC (MISCELLANEOUS) ×3 IMPLANT
COVER TIP SHEARS 8MM DA VINCI (MISCELLANEOUS) ×2
DECANTER SPIKE VIAL GLASS SM (MISCELLANEOUS) ×10 IMPLANT
DEFOGGER SCOPE WARMER CLEARIFY (MISCELLANEOUS) ×5 IMPLANT
DRSG COVADERM PLUS 2X2 (GAUZE/BANDAGES/DRESSINGS) IMPLANT
DRSG OPSITE POSTOP 3X4 (GAUZE/BANDAGES/DRESSINGS) ×5 IMPLANT
ELECT REM PT RETURN 9FT ADLT (ELECTROSURGICAL) ×5
ELECTRODE REM PT RTRN 9FT ADLT (ELECTROSURGICAL) ×3 IMPLANT
FORCEPS CUTTING 33CM 5MM (CUTTING FORCEPS) IMPLANT
FORCEPS CUTTING 45CM 5MM (CUTTING FORCEPS) IMPLANT
GAUZE VASELINE 3X9 (GAUZE/BANDAGES/DRESSINGS) IMPLANT
GLOVE BIO SURGEON STRL SZ7.5 (GLOVE) ×10 IMPLANT
GLOVE BIOGEL PI IND STRL 7.0 (GLOVE) ×3 IMPLANT
GLOVE BIOGEL PI INDICATOR 7.0 (GLOVE) ×2
GOWN STRL REUS W/TWL LRG LVL3 (GOWN DISPOSABLE) ×10 IMPLANT
GYRUS RUMI II 2.5CM BLUE (DISPOSABLE)
GYRUS RUMI II 3.5CM BLUE (DISPOSABLE)
GYRUS RUMI II 4.0CM BLUE (DISPOSABLE)
KIT ACCESSORY DA VINCI DISP (KITS) ×2
KIT ACCESSORY DVNC DISP (KITS) ×3 IMPLANT
LEGGING LITHOTOMY PAIR STRL (DRAPES) IMPLANT
LIQUID BAND (GAUZE/BANDAGES/DRESSINGS) ×5 IMPLANT
NEEDLE HYPO 22GX1.5 SAFETY (NEEDLE) ×5 IMPLANT
NEEDLE INSUFFLATION 120MM (ENDOMECHANICALS) ×5 IMPLANT
NEEDLE INSUFFLATION 150MM (ENDOMECHANICALS) IMPLANT
PACK LAPAROSCOPY BASIN (CUSTOM PROCEDURE TRAY) IMPLANT
PACK ROBOT WH (CUSTOM PROCEDURE TRAY) ×5 IMPLANT
PACK ROBOTIC GOWN (GOWN DISPOSABLE) ×5 IMPLANT
PACK VAGINAL MINOR WOMEN LF (CUSTOM PROCEDURE TRAY) ×5 IMPLANT
PAD OB MATERNITY 4.3X12.25 (PERSONAL CARE ITEMS) ×5 IMPLANT
PAD PREP 24X48 CUFFED NSTRL (MISCELLANEOUS) ×10 IMPLANT
PAD TRENDELENBURG POSITION (MISCELLANEOUS) ×5 IMPLANT
PEN SKIN MARKING STD PT W/RULR (MISCELLANEOUS) ×5 IMPLANT
RUMI II 3.0CM BLUE KOH-EFFICIE (DISPOSABLE) IMPLANT
RUMI II GYRUS 2.5CM BLUE (DISPOSABLE) IMPLANT
RUMI II GYRUS 3.5CM BLUE (DISPOSABLE) IMPLANT
RUMI II GYRUS 4.0CM BLUE (DISPOSABLE) IMPLANT
SET CYSTO W/LG BORE CLAMP LF (SET/KITS/TRAYS/PACK) IMPLANT
SET IRRIG TUBING LAPAROSCOPIC (IRRIGATION / IRRIGATOR) ×5 IMPLANT
SET TRI-LUMEN FLTR TB AIRSEAL (TUBING) IMPLANT
SLEEVE XCEL OPT CAN 5 100 (ENDOMECHANICALS) IMPLANT
SOLUTION ELECTROLUBE (MISCELLANEOUS) ×5 IMPLANT
SUT VIC AB 0 CT1 27 (SUTURE) ×4
SUT VIC AB 0 CT1 27XBRD ANBCTR (SUTURE) ×6 IMPLANT
SUT VICRYL 0 UR6 27IN ABS (SUTURE) ×5 IMPLANT
SUT VICRYL 4-0 PS2 18IN ABS (SUTURE) ×10 IMPLANT
SUT VICRYL RAPIDE 4/0 PS 2 (SUTURE) ×10 IMPLANT
SUT VLOC 180 0 9IN  GS21 (SUTURE)
SUT VLOC 180 0 9IN GS21 (SUTURE) IMPLANT
SYR 50ML LL SCALE MARK (SYRINGE) ×5 IMPLANT
SYR CONTROL 10ML LL (SYRINGE) ×5 IMPLANT
SYR TB 1ML 25GX5/8 (SYRINGE) ×5 IMPLANT
SYRINGE 10CC LL (SYRINGE) ×5 IMPLANT
SYSTEM CONVERTIBLE TROCAR (TROCAR) ×5 IMPLANT
TOWEL OR 17X24 6PK STRL BLUE (TOWEL DISPOSABLE) ×15 IMPLANT
TRAY FOLEY CATH SILVER 14FR (SET/KITS/TRAYS/PACK) ×5 IMPLANT
TROCAR DISP BLADELESS 8 DVNC (TROCAR) ×3 IMPLANT
TROCAR DISP BLADELESS 8MM (TROCAR) ×2
TROCAR OPTI TIP 5M 100M (ENDOMECHANICALS) ×10 IMPLANT
TROCAR PORT AIRSEAL 5X120 (TROCAR) IMPLANT
TROCAR XCEL 12X100 BLDLESS (ENDOMECHANICALS) IMPLANT
TROCAR XCEL DIL TIP R 11M (ENDOMECHANICALS) IMPLANT
TROCAR Z-THREAD 12X150 (TROCAR) IMPLANT
TUBING AQUILEX INFLOW (TUBING) ×5 IMPLANT
TUBING AQUILEX OUTFLOW (TUBING) ×5 IMPLANT
WARMER LAPAROSCOPE (MISCELLANEOUS) ×5 IMPLANT
WATER STERILE IRR 1000ML POUR (IV SOLUTION) ×5 IMPLANT

## 2015-06-16 NOTE — Op Note (Signed)
06/16/2015  9:30 AM  PATIENT:  Claire [name]  45 y.o. female  PRE-OPERATIVE DIAGNOSIS:  Right Lower Quadrant Pain MENORRHAGIA  POST-OPERATIVE DIAGNOSIS:  Right Lower Quadrant Pain MENORRHAGIA  PROCEDURE:  Procedure(s): LAPAROSCOPY DIAGNOSTIC ROBOTIC ASSISTED SALPINGO OOPHORECTOMY, EXCISION OF RIGHT CUL DE SAC MASS DILATATION & CURETTAGE/HYSTEROSCOPY WITH NOVASURE ABLATION LYSIS OF ADHESION  SURGEON:  Surgeon(s): Brien Few, MD  ASSISTANTS: Renato Battles, CNM   ANESTHESIA:   local and general  ESTIMATED BLOOD LOSS: * No blood loss amount entered *   DRAINS: Urinary Catheter (Foley)   LOCAL MEDICATIONS USED:  MARCAINE    and Amount: 30  ml  SPECIMEN:  Source of Specimen:  rso, emc, mass  DISPOSITION OF SPECIMEN:  PATHOLOGY  COUNTS:  YES  DICTATION #: done  PLAN OF CARE: done  PATIENT DISPOSITION:  PACU - hemodynamically stable.               06/16/2015  9:30 AM  PATIENT:  Claire [name]  45 y.o. female  PRE-OPERATIVE DIAGNOSIS:  Right Lower Quadrant Pain MENORRHAGIA  POST-OPERATIVE DIAGNOSIS:  Right Lower Quadrant Pain MENORRHAGIA  PROCEDURE:  Procedure(s): LAPAROSCOPY DIAGNOSTIC ROBOTIC ASSISTED SALPINGO OOPHORECTOMY EXCISION OF RIGHT CUL DE [place] MASS DILATATION & CURETTAGE/HYSTEROSCOPY WITH NOVASURE ABLATION LYSIS OF ADHESIONs- left sigmoid mesenteric  SURGEON:  Surgeon(s): Brien Few, MD  ASSISTANTS: Renato Battles, CNM  ANESTHESIA:   local and general  ESTIMATED BLOOD LOSS: minmal, fluid deficit 100cc  DRAINS: Urinary Catheter (Foley)   LOCAL MEDICATIONS USED:  MARCAINE    and Amount: 30 ml  SPECIMEN:  Source of Specimen:  RSO and right cul de sac mass  DISPOSITION OF SPECIMEN:  PATHOLOGY  COUNTS:  YES  DICTATION #: [id number]  PLAN OF CARE: dc home  PATIENT DISPOSITION:  PACU - hemodynamically stable.

## 2015-06-16 NOTE — Anesthesia Procedure Notes (Signed)
Procedure Name: Intubation Date/Time: 06/16/2015 7:49 AM Performed by: Tobin Chad Pre-anesthesia Checklist: Patient identified, Suction available, Patient being monitored and Timeout performed Patient Re-evaluated:Patient Re-evaluated prior to inductionOxygen Delivery Method: Circle system utilized and Simple face mask Preoxygenation: Pre-oxygenation with 100% oxygen Intubation Type: IV induction Ventilation: Mask ventilation without difficulty Laryngoscope Size: Mac and 3 Grade View: Grade II Tube type: Oral Tube size: 7.0 mm Number of attempts: 1 Airway Equipment and Method: Stylet Placement Confirmation: ETT inserted through vocal cords under direct vision,  positive ETCO2 and breath sounds checked- equal and bilateral Secured at: 22 cm Tube secured with: Tape Dental Injury: Teeth and Oropharynx as per pre-operative assessment

## 2015-06-16 NOTE — Discharge Instructions (Signed)

## 2015-06-16 NOTE — Transfer of Care (Signed)
Immediate Anesthesia Transfer of Care Note  Patient: Claire [name]  Procedure(s) Performed: Procedure(s): LAPAROSCOPY DIAGNOSTIC (N/A) ROBOTIC ASSISTED SALPINGO OOPHORECTOMY, EXCISION OF RIGHT CUL DE [place] MASS (Right) DILATATION & CURETTAGE/HYSTEROSCOPY WITH NOVASURE ABLATION (N/A) LYSIS OF ADHESION (N/A)  Patient Location: PACU  Anesthesia Type:General  Level of Consciousness: awake, oriented and patient cooperative  Airway & Oxygen Therapy: Patient Spontanous Breathing and Patient connected to nasal cannula oxygen  Post-op Assessment: Report given to RN and Post -op Vital signs reviewed and stable  Post vital signs: Reviewed and stable  Last Vitals:  Filed Vitals:   06/16/15 0556  BP: 119/60  Pulse: 87  Temp: 36.7 C  Resp: 20    Last Pain: There were no vitals filed for this visit.       Complications: No apparent anesthesia complications

## 2015-06-16 NOTE — Progress Notes (Signed)
Patient ID: Claire [name], female   DOB: 10/14/1970, 45 y.o.   MRN: [id number] Patient seen and examined. Consent witnessed and signed. No changes noted. Update completed.

## 2015-06-17 ENCOUNTER — Encounter (HOSPITAL_COMMUNITY): Payer: Self-pay | Admitting: Obstetrics and Gynecology

## 2015-06-17 NOTE — Op Note (Signed)
Claire [name] NO.:  [id number]  MEDICAL RECORD NO.:  [id number]  LOCATION:  [facility]                          FACILITY:  [facility]  PHYSICIAN:  Lovenia Kim, M.D.DATE OF BIRTH:  1970/09/02  DATE OF PROCEDURE: DATE OF DISCHARGE:                              OPERATIVE REPORT   PREOPERATIVE DIAGNOSES:  Menorrhagia, right lower quadrant pain.  POSTOPERATIVE DIAGNOSES:  Right cul-de-sac mass, menorrhagia, right lower quadrant pain, and pelvic adhesions.  PROCEDURE:  Diagnostic hysteroscopy, D and C, NovaSure endometrial ablation, da-Vinci assisted RSO, lysis of sigmoid mesenteric adhesions, and excision of right cul-de-sac mass.  SURGEON:  Lovenia Kim, M.D.  ASSISTANTMarland Kitchen  Renato Battles.  ANESTHESIA:  General.  ESTIMATED BLOOD LOSS:  Less than 50 mL.  FLUID DEFICIT:  Less than 100 mL.  COMPLICATIONS:  None.  DRAINS:  Foley.  COUNTS:  Correct.  SPECIMEN:  Right ovary, tube, and cul-de-sac mass.  CONDITION:  The patient is recovering in good condition.  BRIEF OPERATIVE NOTE:  After being apprised of risks of anesthesia, infection, bleeding, injury to surrounding organs, possible need for repair, delayed versus immediate complications to include bowel and bladder injury, possible need for repair, the patient was brought to the operating room, where she was administered general anesthetic without complications.  Prepped and draped in usual sterile fashion.  Foley catheter placed.  Exam under anesthesia reveals anteflexed uterus and no adnexal masses.  Cervix was easily dilated up to a #23 Pratt dilator. Hysteroscope placed.  Visualization revealed thickened anterior wall, endometrium, and posterior wall.  At this time, D and C was performed using sharp curettage in a 4-quadrant method.  Hysteroscope removed. NovaSure device was placed and seated to a length of 6.5 and a width of 3.  CO2 test was negative.  Procedure was initiated without difficulty and  removed.  The device was inspected and found to be hemostatic. Revisualization reveals a well-ablated endometrial cavity.  No evidence of perforation.  Hulka tenaculum was placed.  Attention was turned to the abdominal portion of the procedure, whereby an umbilical incision was made with the scalpel and fascia identified, opened, and Hassan trocar placed.  3 L of CO2 insufflated without difficulty.  Trocar was placed atraumatically.  Visualization revealed a normal uterus, normal anterior cul-de-sac, normal left tube and ovary, slightly enlarged right tube and ovary.  There is a fibrotic cystic mass in the right cul-de-sac and adhesions of the left sigmoid mesentery to the anterior abdominal wall.  The robotic trocars were placed, left and right, two 8 mm in left and right lower quadrants and a 5-mm port was placed in the left upper quadrant.  AirSeal was initiated, robot was initiated, docked in a standard fashion after deep Trendelenburg position.  The mesenteric adhesions were removed sharply using Endo Shears.  Good hemostasis was noted.  The cul-de-sac mass was identified.  The ureters identified. The cul-de-sac mass was pulled medial and excised at its base sharply. Good hemostasis was noted.  The mass was placed into the anterior cul-de- sac.  The infundibulopelvic ligament was skeletonized and divided using bipolar cautery.  The tubo-ovarian complex was then excised at the level of the tubo-ovarian ligament and placed in  the anterior cul-de-sac. Good hemostasis was noted.  The robot was undocked.  The 8 mm camera was placed and EndoCatch was placed, specimen was placed in the bag x2 and removed without difficulty through the abdominal port.  The revisualization revealed good hemostasis and no evidence of bleeding. CO2 was released.  Positive pressure was applied.  All trocars were removed under direct visualization.  Abdominal incision was closed using 0 Vicryl and 4-0 Vicryl.  All  other incisions closed with 4-0 Vicryl. Dermabond placed.  Hulka tenaculum removed vaginally.  The patient tolerated the procedure well.  Was awakened and transferred to recovery in good condition.     Lovenia Kim, M.D.     RJT/MEDQ  D:  06/16/2015  T:  06/16/2015  Job:  [id number]

## 2015-06-17 NOTE — Anesthesia Postprocedure Evaluation (Signed)
Anesthesia Post Note  Patient: Claire [name]  Procedure(s) Performed: Procedure(s) (LRB): LAPAROSCOPY DIAGNOSTIC (N/A) ROBOTIC ASSISTED SALPINGO OOPHORECTOMY, EXCISION OF RIGHT CUL DE [place] MASS (Right) DILATATION & CURETTAGE/HYSTEROSCOPY WITH NOVASURE ABLATION (N/A) LYSIS OF ADHESION (N/A)  Patient location during evaluation: PACU Anesthesia Type: General Level of consciousness: awake Pain management: pain level controlled Vital Signs Assessment: post-procedure vital signs reviewed and stable Respiratory status: spontaneous breathing Cardiovascular status: stable Postop Assessment: no signs of nausea or vomiting Anesthetic complications: no     Last Vitals:  Filed Vitals:   06/16/15 1130 06/16/15 1217  BP: 106/69 108/75  Pulse: 84   Temp:  36.6 C  Resp: 21 18    Last Pain:  Filed Vitals:   06/17/15 1742  PainSc: 5    Pain Goal:                 Camela Wich JR,JOHN Ventura Leggitt

## 2015-06-22 ENCOUNTER — Other Ambulatory Visit: Payer: Self-pay | Admitting: Family Medicine

## 2015-06-23 NOTE — Addendum Note (Signed)
Addendum  created 06/23/15 1826 by Lyn Hollingshead, MD   Modules edited: Anesthesia Responsible Staff

## 2015-07-27 ENCOUNTER — Telehealth: Payer: Self-pay | Admitting: Family Medicine

## 2015-07-27 NOTE — Telephone Encounter (Signed)
Was last seen on 05-02-15 and stated that a referral for CT was suppose to have been ordered. As of today she has not heard anything about that appointment. Would also like to know if that CT is just for her lungs or are you able to order it where it will scan her kidneys as well. States that she is wanting to check to make sure she does not have any kidney stones.

## 2015-07-27 NOTE — Telephone Encounter (Signed)
CT was ordered in March, please make sure it was schedule, for lung nodule follow up only. Sometimes it can pick up part of kidney on CT. Why does she want to be checked for kidney stones?

## 2015-07-31 ENCOUNTER — Other Ambulatory Visit: Payer: Self-pay | Admitting: Family Medicine

## 2015-07-31 DIAGNOSIS — R918 Other nonspecific abnormal finding of lung field: Secondary | ICD-10-CM

## 2015-08-04 ENCOUNTER — Ambulatory Visit
Admission: RE | Admit: 2015-08-04 | Discharge: 2015-08-04 | Disposition: A | Discharge status: Home / Self Care | Payer: BLUE CROSS/BLUE SHIELD | Source: Ambulatory Visit | Attending: Family Medicine | Admitting: Family Medicine

## 2015-08-04 DIAGNOSIS — R918 Other nonspecific abnormal finding of lung field: Secondary | ICD-10-CM | POA: Diagnosis present

## 2015-08-08 ENCOUNTER — Ambulatory Visit (INDEPENDENT_AMBULATORY_CARE_PROVIDER_SITE_OTHER): Payer: BLUE CROSS/BLUE SHIELD | Admitting: Family Medicine

## 2015-08-08 ENCOUNTER — Encounter: Payer: Self-pay | Admitting: Family Medicine

## 2015-08-08 VITALS — BP 120/82 | HR 103 | Temp 98.3°F | Resp 16 | Ht 61.0 in | Wt 171.1 lb

## 2015-08-08 DIAGNOSIS — K589 Irritable bowel syndrome without diarrhea: Secondary | ICD-10-CM

## 2015-08-08 DIAGNOSIS — K219 Gastro-esophageal reflux disease without esophagitis: Secondary | ICD-10-CM

## 2015-08-08 DIAGNOSIS — N760 Acute vaginitis: Secondary | ICD-10-CM | POA: Diagnosis not present

## 2015-08-08 DIAGNOSIS — M545 Low back pain, unspecified: Secondary | ICD-10-CM

## 2015-08-08 DIAGNOSIS — F418 Other specified anxiety disorders: Secondary | ICD-10-CM | POA: Diagnosis not present

## 2015-08-08 DIAGNOSIS — D473 Essential (hemorrhagic) thrombocythemia: Secondary | ICD-10-CM | POA: Diagnosis not present

## 2015-08-08 DIAGNOSIS — G43109 Migraine with aura, not intractable, without status migrainosus: Secondary | ICD-10-CM

## 2015-08-08 DIAGNOSIS — D75839 Thrombocytosis, unspecified: Secondary | ICD-10-CM

## 2015-08-08 DIAGNOSIS — E559 Vitamin D deficiency, unspecified: Secondary | ICD-10-CM | POA: Diagnosis not present

## 2015-08-08 DIAGNOSIS — G4700 Insomnia, unspecified: Secondary | ICD-10-CM

## 2015-08-08 DIAGNOSIS — Z79899 Other long term (current) drug therapy: Secondary | ICD-10-CM

## 2015-08-08 DIAGNOSIS — J4540 Moderate persistent asthma, uncomplicated: Secondary | ICD-10-CM

## 2015-08-08 DIAGNOSIS — Z9884 Bariatric surgery status: Secondary | ICD-10-CM

## 2015-08-08 DIAGNOSIS — Z8639 Personal history of other endocrine, nutritional and metabolic disease: Secondary | ICD-10-CM | POA: Diagnosis not present

## 2015-08-08 DIAGNOSIS — D509 Iron deficiency anemia, unspecified: Secondary | ICD-10-CM | POA: Diagnosis not present

## 2015-08-08 DIAGNOSIS — F5104 Psychophysiologic insomnia: Secondary | ICD-10-CM

## 2015-08-08 MED ORDER — FLUCONAZOLE 150 MG PO TABS: 150.0000 mg | ORAL_TABLET | Freq: Every day | ORAL | Status: DC | PRN

## 2015-08-08 MED ORDER — CYCLOBENZAPRINE HCL 10 MG PO TABS: 10.0000 mg | ORAL_TABLET | Freq: Every day | ORAL | Status: DC

## 2015-08-08 MED ORDER — ALBUTEROL SULFATE HFA 108 (90 BASE) MCG/ACT IN AERS: 2.0000 | INHALATION_SPRAY | Freq: Four times a day (QID) | RESPIRATORY_TRACT | Status: DC | PRN

## 2015-08-08 MED ORDER — MONTELUKAST SODIUM 10 MG PO TABS: 10.0000 mg | ORAL_TABLET | Freq: Every day | ORAL | Status: DC

## 2015-08-08 MED ORDER — ZOLPIDEM TARTRATE 10 MG PO TABS: ORAL_TABLET | ORAL | Status: DC

## 2015-08-08 MED ORDER — ALPRAZOLAM 0.5 MG PO TABS: 0.5000 mg | ORAL_TABLET | Freq: Every evening | ORAL | Status: DC | PRN

## 2015-08-08 MED ORDER — FLUTICASONE FUROATE-VILANTEROL 100-25 MCG/INH IN AEPB: 1.0000 | INHALATION_SPRAY | Freq: Every day | RESPIRATORY_TRACT | Status: DC

## 2015-08-08 NOTE — Addendum Note (Signed)
Addended by: Johnnette Litter A on: 08/08/2015 01:30 PM   Modules accepted: Orders

## 2015-08-08 NOTE — Progress Notes (Signed)
Name: Claire [name]   MRN: [id number]    DOB: [birth year]   Date:08/08/2015       Progress Note  Subjective  Chief Complaint  Chief Complaint  Patient presents with  . Medication Refill  . Asthma    Using inhaler alot more than normal might be due to heat- Feels like Singular might be making symptoms worst  . Irritable Bowel Syndrome    Back to normal  . Gastroesophageal Reflux    Still having symptoms  . Insomnia    Patient is getting 6-7 hours nightly and will wake up throughout the night-not a constant 6-7 hours of sleep at a time  . Depression    Patient states the Celexa was making her not have a care in the world mood and quit taking it   . Allergic Rhinitis     Right ear is stopped up    HPI  IBS : she has a history of IBS, but years ago was more diarrhea type, recently more constipation symptoms, with bloating, passing a lot a gas, seen by Dr. Durwin Reges and is taking a smaller dose of Amitiza to try and Linzess samples - but she has not noticed a difference between the two or improvement of symptoms. She is feeling well at this time   Back pain: she states back pain is daily but stable, worse at the end of the day. She states symptoms got worse a few weeks ago when sitting in the care for a road trip. No radiculitis. No tingling or numbness. 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 She is now on Flexeril and Tylenol . She described pain as aching.   Depression/Anxiety: she is now on Citalopram 40 mg and symptoms are better controlled. Still overwhelmed with work, raising a family. She states she still feels tired, but having less anhedonia. She states the kids are helping out more at home, no new stress.  She has no orgasm with Citalopram but does not want to change medication at this time.   Insomnia: taking medication still wakes up at 2 to 3 am but able to fall back asleep. Still taking Ambien. She states not drinking  anything before bed time has helped.   GERD: she needs refill of Prevacid it has to [employer] brand or it does not work for her. No heartburn, but still has regurgitation.   Asthma Mild Persistent :symptoms are not controlled at this time, she has noticed a nocturnal cough, and some crackling on her chest over the past few weeks, she has been using rescue inhaler every night  AR: doing well on medication, she continues to have some right ear pressure, but improving since had braces placed in January 2017. She denies rhinorrhea, nasal congestion  Migraine: she is still seeing Dr. Domingo Cocking - now off Topamax and is on Zonegran, frequency has gone down from 16 a month down to 3 per month usually around her cycle. Weight has been stable now.   Patient Active Problem List   Diagnosis Date Noted  . Iron deficiency anemia 08/08/2015  . Lumbosacral pain 01/07/2015  . Muscle twitching 01/07/2015  . Intertrigo 11/05/2014  . Perennial allergic rhinitis 09/03/2014  . Lung nodules 09/03/2014  . Vitamin D deficiency 09/03/2014  . History of diabetes mellitus 09/03/2014  . History of kidney stones 09/03/2014  . Chronic insomnia 09/03/2014  . History of iron deficiency anemia 09/03/2014  . Depression with  anxiety 09/03/2014  . Palpitations 06/22/2014  . History of bariatric surgery 12/03/2013  . Body mass index 27.0-27.9, adult 12/03/2013  . Asthma, mild persistent 11/21/2012  . CN (constipation) 11/21/2012  . GERD without esophagitis 11/21/2012  . History of hyperlipidemia 11/21/2012  . Migraine with aura without status migrainosus 11/21/2012  . History of sleep apnea 11/21/2012  . Morbid obesity ([place]) 11/21/2012  . IBS (irritable bowel syndrome) 08/15/2012    Past Surgical History  Procedure Laterality Date  . Laparoscopic gastric sleeve resection  2012  . Cholecystectomy  2010  . Bladder surgery  2010  . Plantar fascia surgery Bilateral   . Breast biopsy Right 2014    stereotatic biopsy  .  Colonoscopy  2014     Done at [facility]  . Upper gi endoscopy    . Tonsillectomy    . Laparoscopy N/A 06/16/2015    Procedure: LAPAROSCOPY DIAGNOSTIC;  Surgeon: Brien Few, MD;  Location: [facility];  Service: Gynecology;  Laterality: N/A;  . Robotic assisted salpingo oopherectomy Right 06/16/2015    Procedure: ROBOTIC ASSISTED SALPINGO OOPHORECTOMY, EXCISION OF RIGHT CUL DE [place] MASS;  Surgeon: Brien Few, MD;  Location: [facility];  Service: Gynecology;  Laterality: Right;  . Dilitation & currettage/hystroscopy with novasure ablation N/A 06/16/2015    Procedure: DILATATION & CURETTAGE/HYSTEROSCOPY WITH NOVASURE ABLATION;  Surgeon: Brien Few, MD;  Location: [facility];  Service: Gynecology;  Laterality: N/A;  . Lysis of adhesion N/A 06/16/2015    Procedure: LYSIS OF ADHESION;  Surgeon: Brien Few, MD;  Location: [facility];  Service: Gynecology;  Laterality: N/A;    Family History  Problem Relation Age of Onset  . Breast cancer Paternal Aunt   . Breast cancer Paternal Grandmother   . Lung cancer Maternal Grandfather   . Heart attack Mother   . Stroke Mother   . Multiple myeloma Mother   . Hyperlipidemia Father   . Heart attack Sister   . Heart attack Maternal Uncle   . Heart attack Paternal Grandfather     Social History   Social History  . Marital Status: Married    Spouse Name: N/A  . Number of Children: N/A  . Years of Education: N/A   Occupational History  . Not on file.   Social History Main Topics  . Smoking status: Former Smoker -- 1.00 packs/day for 10 years    Types: Cigarettes    Quit date: 02/19/1993  . Smokeless tobacco: Never Used  . Alcohol Use: 0.0 oz/week    0 Standard drinks or equivalent per week     Comment: occasionally  . Drug Use: No  . Sexual Activity:    Partners: Male   Other Topics Concern  . Not on file   Social History Narrative     Current outpatient prescriptions:  .  albuterol (PROVENTIL HFA;VENTOLIN HFA) 108 (90 Base) MCG/ACT  inhaler, Inhale 2 puffs into the lungs every 6 (six) hours as needed for wheezing or shortness of breath., Disp: 3 Inhaler, Rfl: 0 .  ALPRAZolam (XANAX) 0.5 MG tablet, Take 1 tablet (0.5 mg total) by mouth at bedtime as needed for anxiety., Disp: 90 tablet, Rfl: 0 .  Ascorbic Acid (VITAMIN C) 1000 MG tablet, Take 1,000 mg by mouth., Disp: , Rfl:  .  calcium carbonate (OS-CAL) 600 MG TABS tablet, Take 600 mg by mouth daily with breakfast., Disp: , Rfl:  .  Cholecalciferol (VITAMIN D) 1000 UNITS capsule, Take 1,000 Units by mouth daily., Disp: , Rfl:  .  clobetasol ointment (TEMOVATE) 0.05 %, , Disp: , Rfl: 0 .  cyclobenzaprine (FLEXERIL) 10 MG tablet, Take 1 tablet (10 mg total) by mouth at bedtime. Prn, Disp: 90 tablet, Rfl: 0 .  ferrous sulfate 325 (65 FE) MG EC tablet, Take 325 mg by mouth 3 (three) times daily with meals., Disp: , Rfl:  .  fluconazole (DIFLUCAN) 150 MG tablet, Take 1 tablet (150 mg total) by mouth daily as needed., Disp: 9 tablet, Rfl: 0 .  lansoprazole (PREVACID) 30 MG capsule, Take 1 capsule (30 mg total) by mouth 2 (two) times daily before a meal., Disp: 180 capsule, Rfl: 1 .  loratadine (CLARITIN) 10 MG tablet, Take 10 mg by mouth daily., Disp: , Rfl:  .  mometasone (NASONEX) 50 MCG/ACT nasal spray, USE 2 SPRAYS IN EACH NOSTRIL AT BEDTIME, Disp: 51 g, Rfl: 1 .  montelukast (SINGULAIR) 10 MG tablet, Take 1 tablet (10 mg total) by mouth at bedtime., Disp: 90 tablet, Rfl: 1 .  Multiple Vitamin (MULTIVITAMIN) tablet, Take 1 tablet by mouth daily., Disp: , Rfl:  .  mupirocin ointment (BACTROBAN) 2 %, APP EXT AA TID FOR 10 DAYS, Disp: , Rfl: 0 .  Potassium Aminobenzoate 500 MG CAPS, Take 1 capsule by mouth daily., Disp: , Rfl:  .  promethazine (PHENERGAN) 12.5 MG tablet, TAKE 1 TABLET BY MOUTH EVERY 6 HOURS AS NEEDED FOR MIGRAINES WITH NAUSEA, Disp: 20 tablet, Rfl: 2 .  vitamin B-12 (CYANOCOBALAMIN) 100 MCG tablet, Take 100 mcg by mouth as directed. , Disp: , Rfl:  .  zolpidem  (AMBIEN) 10 MG tablet, Must be [employer], Disp: 90 tablet, Rfl: 0 .  zonisamide (ZONEGRAN) 100 MG capsule, Take 1 capsule by mouth every evening., Disp: , Rfl: 1 .  fluticasone furoate-vilanterol (BREO ELLIPTA) 100-25 MCG/INH AEPB, Inhale 1 puff into the lungs daily., Disp: 60 each, Rfl: 0  Allergies  Allergen Reactions  . Other Hives    Ricotta Cheese: flushed and hot  . Vicodin [Hydrocodone-Acetaminophen] Itching  . Cymbalta [Duloxetine Hcl] Palpitations    And photosensitivity     ROS  Ten systems reviewed and is negative except as mentioned in HPI   Objective  Filed Vitals:   08/08/15 1104  BP: 120/82  Pulse: 103  Temp: 98.3 F (36.8 C)  TempSrc: Oral  Resp: 16  Height: 5' 1"  (1.549 m)  Weight: 171 lb 1.6 oz (77.61 kg)  SpO2: 97%    Body mass index is 32.35 kg/(m^2).  Physical Exam  Constitutional: Patient appears well-developed and well-nourished. Obese  No distress.  HEENT: head atraumatic, normocephalic, pupils equal and reactive to light,  neck supple, throat within normal limits Cardiovascular: Normal rate, regular rhythm and normal heart sounds.  No murmur heard. No BLE edema. Pulmonary/Chest: Effort normal and breath sounds normal. No respiratory distress. Abdominal: Soft.  There is no tenderness. Psychiatric: Patient has a normal mood and affect. behavior is normal. Judgment and thought content normal. Muscular Skeletal: mild tenderness during palpation of lumbar spine, negative straight leg raise  Recent Results (from the past 2160 hour(s))  CBC     Status: None   Collection Time: 06/07/15  4:25 PM  Result Value Ref Range   WBC 9.1 4.0 - 10.5 K/uL   RBC 4.70 3.87 - 5.11 MIL/uL   Hemoglobin 14.0 12.0 - 15.0 g/dL   HCT 41.1 36.0 - 46.0 %   MCV 87.4 78.0 - 100.0 fL   MCH 29.8 26.0 - 34.0 pg   MCHC 34.1  30.0 - 36.0 g/dL   RDW 13.3 11.5 - 15.5 %   Platelets 345 150 - 400 K/uL    Diabetic Foot Exam: Diabetic Foot Exam - Simple   Simple Foot  Form  Diabetic Foot exam was performed with the following findings:  Yes 08/08/2015 12:03 PM  Visual Inspection  No deformities, no ulcerations, no other skin breakdown bilaterally:  Yes  Sensation Testing  Intact to touch and monofilament testing bilaterally:  Yes  Pulse Check  Posterior Tibialis and Dorsalis pulse intact bilaterally:  Yes  Comments       PHQ2/9: Depression screen [facility] 2/9 08/08/2015 05/02/2015 01/07/2015 11/05/2014 09/03/2014  Decreased Interest 0 0 0 1 0  Down, Depressed, Hopeless 0 0 0 1 0  PHQ - 2 Score 0 0 0 2 0  Altered sleeping - - - 3 -  Tired, decreased energy - - - 3 -  Change in appetite - - - 2 -  Feeling bad or failure about yourself  - - - 0 -  Trouble concentrating - - - 3 -  Moving slowly or fidgety/restless - - - 1 -  Suicidal thoughts - - - 0 -  PHQ-9 Score - - - 14 -  Difficult doing work/chores - - - Not difficult at all -     Fall Risk: Fall Risk  08/08/2015 05/02/2015 01/07/2015 11/05/2014 09/03/2014  Falls in the past year? No No No No No     Functional Status Survey: Is the patient deaf or have difficulty hearing?: No Does the patient have difficulty seeing, even when wearing glasses/contacts?: No Does the patient have difficulty concentrating, remembering, or making decisions?: No Does the patient have difficulty walking or climbing stairs?: No Does the patient have difficulty dressing or bathing?: No Does the patient have difficulty doing errands alone such as visiting a doctor's office or shopping?: No   Assessment & Plan  1. Chronic insomnia  - zolpidem (AMBIEN) 10 MG tablet; Must be [employer]  Dispense: 90 tablet; Refill: 0  2. History of bariatric surgery  - Lipid panel - Vitamin B12 - Ferritin - CBC with Differential/Platelet  3. IBS (irritable bowel syndrome)  Doing well at this time, no pain  4. Uncontrolled moderate persistent asthma  - montelukast (SINGULAIR) 10 MG tablet; Take 1 tablet (10 mg total) by  mouth at bedtime.  Dispense: 90 tablet; Refill: 1 - albuterol (PROVENTIL HFA;VENTOLIN HFA) 108 (90 Base) MCG/ACT inhaler; Inhale 2 puffs into the lungs every 6 (six) hours as needed for wheezing or shortness of breath.  Dispense: 3 Inhaler; Refill: 0 - fluticasone furoate-vilanterol (BREO ELLIPTA) 100-25 MCG/INH AEPB; Inhale 1 puff into the lungs daily.  Dispense: 60 each; Refill: 0  5. Vitamin D deficiency  Recheck labs  6. Thrombocytosis (HCC)  - VITAMIN D 25 Hydroxy (Vit-D Deficiency, Fractures) - CBC with Differential/Platelet  7. Iron deficiency anemia  - Ferritin - CBC with Differential/Platelet  8. Migraine with aura and without status migrainosus, not intractable  - cyclobenzaprine (FLEXERIL) 10 MG tablet; Take 1 tablet (10 mg total) by mouth at bedtime. Prn  Dispense: 90 tablet; Refill: 0 - CBC with Differential/Platelet - POCT UA - Microalbumin  9. History of diabetes mellitus  - Hemoglobin A1c  10. Depression with anxiety  - ALPRAZolam (XANAX) 0.5 MG tablet; Take 1 tablet (0.5 mg total) by mouth at bedtime as needed for anxiety.  Dispense: 90 tablet; Refill: 0  11. Recurrent vaginitis  - fluconazole (DIFLUCAN) 150 MG tablet;  Take 1 tablet (150 mg total) by mouth daily as needed.  Dispense: 9 tablet; Refill: 0  12. Long-term use of high-risk medication  - Comprehensive metabolic panel  13. GERD without esophagitis  Continue medication   14. Midline low back pain without sciatica  Recent travelled and back has been painful, advised to take Flexeril for next few nights

## 2015-08-16 ENCOUNTER — Other Ambulatory Visit: Payer: Self-pay | Admitting: Family Medicine

## 2015-08-17 NOTE — Telephone Encounter (Signed)
Patient requesting refill. 

## 2015-10-13 ENCOUNTER — Telehealth: Payer: Self-pay

## 2015-10-13 NOTE — Telephone Encounter (Signed)
Patient called and states the local pharmacy filled a 30 day prescription of her Alprazolam with the Manufacture of [employer] and this one did not give the patient headaches. Patient states the Mail Order uses the [employer] which she has side effects too and received the 90 day prescription but does not want to take. Patient wanted to know if she could bring her pill bottle up here with the [facility] to show you she has not taken any of these to receive a new one to her mail order with the manufacture of Actavis.

## 2015-10-14 ENCOUNTER — Other Ambulatory Visit: Payer: Self-pay | Admitting: Family Medicine

## 2015-10-14 DIAGNOSIS — F418 Other specified anxiety disorders: Secondary | ICD-10-CM

## 2015-10-14 MED ORDER — ALPRAZOLAM 0.5 MG PO TABS: 0.5000 mg | ORAL_TABLET | Freq: Every evening | ORAL | 0 refills | Status: DC | PRN

## 2015-10-14 NOTE — Telephone Encounter (Signed)
Pills counted and she has 90A Alprazolam pills. It was flushed with two witness - and new rx for the [employer] will be placed

## 2015-11-11 ENCOUNTER — Encounter: Payer: Self-pay | Admitting: Family Medicine

## 2015-11-11 ENCOUNTER — Ambulatory Visit (INDEPENDENT_AMBULATORY_CARE_PROVIDER_SITE_OTHER): Payer: BLUE CROSS/BLUE SHIELD | Admitting: Family Medicine

## 2015-11-11 VITALS — BP 112/70 | HR 95 | Temp 98.4°F | Resp 18 | Ht 61.0 in | Wt 168.2 lb

## 2015-11-11 DIAGNOSIS — Z23 Encounter for immunization: Secondary | ICD-10-CM

## 2015-11-11 DIAGNOSIS — E119 Type 2 diabetes mellitus without complications: Secondary | ICD-10-CM | POA: Diagnosis not present

## 2015-11-11 DIAGNOSIS — G43109 Migraine with aura, not intractable, without status migrainosus: Secondary | ICD-10-CM

## 2015-11-11 DIAGNOSIS — G4700 Insomnia, unspecified: Secondary | ICD-10-CM

## 2015-11-11 DIAGNOSIS — J4530 Mild persistent asthma, uncomplicated: Secondary | ICD-10-CM | POA: Diagnosis not present

## 2015-11-11 DIAGNOSIS — K219 Gastro-esophageal reflux disease without esophagitis: Secondary | ICD-10-CM

## 2015-11-11 DIAGNOSIS — Z9884 Bariatric surgery status: Secondary | ICD-10-CM | POA: Diagnosis not present

## 2015-11-11 DIAGNOSIS — Z79899 Other long term (current) drug therapy: Secondary | ICD-10-CM

## 2015-11-11 DIAGNOSIS — M5489 Other dorsalgia: Secondary | ICD-10-CM

## 2015-11-11 DIAGNOSIS — R202 Paresthesia of skin: Secondary | ICD-10-CM

## 2015-11-11 DIAGNOSIS — F418 Other specified anxiety disorders: Secondary | ICD-10-CM | POA: Diagnosis not present

## 2015-11-11 DIAGNOSIS — E559 Vitamin D deficiency, unspecified: Secondary | ICD-10-CM

## 2015-11-11 DIAGNOSIS — Z8639 Personal history of other endocrine, nutritional and metabolic disease: Secondary | ICD-10-CM

## 2015-11-11 DIAGNOSIS — F5104 Psychophysiologic insomnia: Secondary | ICD-10-CM

## 2015-11-11 DIAGNOSIS — N760 Acute vaginitis: Secondary | ICD-10-CM

## 2015-11-11 DIAGNOSIS — R5383 Other fatigue: Secondary | ICD-10-CM

## 2015-11-11 DIAGNOSIS — D509 Iron deficiency anemia, unspecified: Secondary | ICD-10-CM | POA: Diagnosis not present

## 2015-11-11 DIAGNOSIS — D75839 Thrombocytosis, unspecified: Secondary | ICD-10-CM

## 2015-11-11 DIAGNOSIS — K589 Irritable bowel syndrome without diarrhea: Secondary | ICD-10-CM

## 2015-11-11 DIAGNOSIS — M545 Low back pain, unspecified: Secondary | ICD-10-CM

## 2015-11-11 DIAGNOSIS — D473 Essential (hemorrhagic) thrombocythemia: Secondary | ICD-10-CM

## 2015-11-11 LAB — CBC WITH DIFFERENTIAL/PLATELET
BASOS PCT: 0 %
Basophils Absolute: 0 cells/uL (ref 0–200)
Eosinophils Absolute: 390 cells/uL (ref 15–500)
Eosinophils Relative: 5 %
HEMATOCRIT: 40.5 % (ref 35.0–45.0)
HEMOGLOBIN: 13.8 g/dL (ref 11.7–15.5)
LYMPHS ABS: 1482 {cells}/uL (ref 850–3900)
Lymphocytes Relative: 19 %
MCH: 29.1 pg (ref 27.0–33.0)
MCHC: 34.1 g/dL (ref 32.0–36.0)
MCV: 85.3 fL (ref 80.0–100.0)
MPV: 10.2 fL (ref 7.5–12.5)
Monocytes Absolute: 624 cells/uL (ref 200–950)
Monocytes Relative: 8 %
NEUTROS ABS: 5304 {cells}/uL (ref 1500–7800)
NEUTROS PCT: 68 %
Platelets: 362 10*3/uL (ref 140–400)
RBC: 4.75 MIL/uL (ref 3.80–5.10)
RDW: 13.5 % (ref 11.0–15.0)
WBC: 7.8 10*3/uL (ref 3.8–10.8)

## 2015-11-11 LAB — COMPLETE METABOLIC PANEL WITH GFR
ALK PHOS: 67 U/L (ref 33–115)
ALT: 26 U/L (ref 6–29)
AST: 17 U/L (ref 10–30)
Albumin: 3.9 g/dL (ref 3.6–5.1)
BUN: 8 mg/dL (ref 7–25)
CO2: 25 mmol/L (ref 20–31)
CREATININE: 0.79 mg/dL (ref 0.50–1.10)
Calcium: 9 mg/dL (ref 8.6–10.2)
Chloride: 106 mmol/L (ref 98–110)
GFR, Est African American: >89 mL/min (ref >=60)
GFR, Est Non African American: >89 mL/min (ref >=60)
GLUCOSE: 101 mg/dL — AB (ref 65–99)
Potassium: 4.3 mmol/L (ref 3.5–5.3)
Sodium: 138 mmol/L (ref 135–146)
TOTAL PROTEIN: 6.7 g/dL (ref 6.1–8.1)
Total Bilirubin: 0.5 mg/dL (ref 0.2–1.2)

## 2015-11-11 LAB — TSH: TSH: 1.42 mIU/L

## 2015-11-11 LAB — LIPID PANEL
CHOLESTEROL: 178 mg/dL (ref 125–200)
HDL: 53 mg/dL (ref >=46)
LDL Cholesterol: 105 mg/dL (ref <130)
Total CHOL/HDL Ratio: 3.4 Ratio (ref <=5.0)
Triglycerides: 100 mg/dL (ref <150)
VLDL: 20 mg/dL (ref <30)

## 2015-11-11 LAB — VITAMIN B12: Vitamin B-12: 353 pg/mL (ref 200–1100)

## 2015-11-11 MED ORDER — ZOLPIDEM TARTRATE 10 MG PO TABS: ORAL_TABLET | ORAL | 0 refills | Status: DC

## 2015-11-11 MED ORDER — LANSOPRAZOLE 30 MG PO CPDR: 30.0000 mg | DELAYED_RELEASE_CAPSULE | Freq: Two times a day (BID) | ORAL | 1 refills | Status: DC

## 2015-11-11 MED ORDER — CYCLOBENZAPRINE HCL 10 MG PO TABS: 10.0000 mg | ORAL_TABLET | Freq: Every day | ORAL | 1 refills | Status: DC

## 2015-11-11 MED ORDER — MELOXICAM 15 MG PO TABS: 15.0000 mg | ORAL_TABLET | Freq: Every day | ORAL | 0 refills | Status: DC

## 2015-11-11 MED ORDER — FLUCONAZOLE 150 MG PO TABS: 150.0000 mg | ORAL_TABLET | Freq: Every day | ORAL | 1 refills | Status: DC | PRN

## 2015-11-11 NOTE — Progress Notes (Signed)
Name: Claire [name]   MRN: [id number]    DOB: 01/27/1971   Date:11/11/2015       Progress Note  Subjective  Chief Complaint  Chief Complaint  Patient presents with  . Follow-up    3 mnth     HPI   IBS : she has a history of IBS, mixed with constipation and diarrhea, with bloating, passing a lot a gas, seen by Dr. Durwin [name], she tried Amitiza without helps and LInzess made symptoms worse. She is feeling well at this time. She states Linzess made symptoms worse  Back pain: she states back pain is daily but stable, worse at the end of the day. She has noticed that the pain is getting worse and has started to noticed tingling on both feet, no bowel or bladder incontinence - started after a 9 hour car trip. 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 She is now on Flexeril and Tylenol . Symptoms are intermittent.  Depression/Anxiety: she is now on Citalopram 40 mg and symptoms are better controlled. Still overwhelmed with work, raising a family. She states she still feels tired, but having less anhedonia.. She is more stressed about her father, that is now going out with her maternal aunt - who caused problems in the past, also misses her mother that died one year ago. She stopped Celexa because of side effects of medication ( she was too numb )  Insomnia: taking medication still wakes up at 2 to 3 am but able to fall back asleep. Still taking Ambien, unable to go down on dose of Ambien  GERD: she needs refill of Prevacid it has to [employer] brand or it does not work for her. No heartburn, but still has regurgitation.   Asthma Mild Persistent: she is doing well now, she took [place] for one month back in June, but no longer having nocturnal cough or wheezing, she has not used her rescue inhaler in over 6 weeks  Fatigue: she has been feeling tired all the time, she has a history of bariatric surgery, and iron deficiency anemia, we will check  labs  AR: doing well on medication, she continues to have some right ear pressure, but improving since had braces placed in January 2017. She denies rhinorrhea, nasal congestion  Migraine: she is still seeing Dr. Domingo [name] - now off Topamax and is on Zonegran, frequency has gone down from 16 a month down to 3 per month usually around her cycle. Weight has been stable now.   DMII: diagnosed at age 78, took medication for a while, but stopped all medications 10/25/2010 after bariatric surgery, glucose has been controlled, no polyphagia, polyuria or polydipsia. Maximum weight of 265 lbs and is down to 168.3lbs. She is still trying to lose more weight. Due for eye exam  Dyslipidemia: recheck labs, off medication   Patient Active Problem List   Diagnosis Date Noted  . Iron deficiency anemia 08/08/2015  . Lumbosacral pain 01/07/2015  . Muscle twitching 01/07/2015  . Intertrigo 11/05/2014  . Perennial allergic rhinitis 09/03/2014  . Lung nodules 09/03/2014  . Vitamin D deficiency 09/03/2014  . Diet-controlled diabetes mellitus ([place]) 09/03/2014  . History of kidney stones 09/03/2014  . Chronic insomnia 09/03/2014  . History of iron deficiency anemia 09/03/2014  . Depression with anxiety 09/03/2014  . Palpitations 06/22/2014  . History of bariatric surgery 12/03/2013  . Body mass index 27.0-27.9, adult 12/03/2013  . Asthma, mild persistent  11/21/2012  . CN (constipation) 11/21/2012  . GERD without esophagitis 11/21/2012  . History of hyperlipidemia 11/21/2012  . Migraine with aura without status migrainosus 11/21/2012  . History of sleep apnea 11/21/2012  . Morbid obesity ([place]) 11/21/2012  . IBS (irritable bowel syndrome) 08/15/2012    Past Surgical History:  Procedure Laterality Date  . BLADDER SURGERY  2010  . BREAST BIOPSY Right 2014   stereotatic biopsy  . CHOLECYSTECTOMY  2010  . COLONOSCOPY  2014    Done at [facility]  . DILITATION & CURRETTAGE/HYSTROSCOPY WITH NOVASURE  ABLATION N/A 06/16/2015   Procedure: DILATATION & CURETTAGE/HYSTEROSCOPY WITH NOVASURE ABLATION;  Surgeon: Claire Few, MD;  Location: [facility];  Service: Gynecology;  Laterality: N/A;  . [place] RESECTION  2012  . LAPAROSCOPY N/A 06/16/2015   Procedure: LAPAROSCOPY DIAGNOSTIC;  Surgeon: Claire Few, MD;  Location: [facility];  Service: Gynecology;  Laterality: N/A;  . LYSIS OF ADHESION N/A 06/16/2015   Procedure: LYSIS OF ADHESION;  Surgeon: Claire Few, MD;  Location: [facility];  Service: Gynecology;  Laterality: N/A;  . PLANTAR FASCIA SURGERY Bilateral   . ROBOTIC ASSISTED SALPINGO OOPHERECTOMY Right 06/16/2015   Procedure: ROBOTIC ASSISTED SALPINGO OOPHORECTOMY, EXCISION OF RIGHT CUL DE [place] MASS;  Surgeon: Claire Few, MD;  Location: [facility];  Service: Gynecology;  Laterality: Right;  . TONSILLECTOMY    . UPPER GI ENDOSCOPY      Family History  Problem Relation Age of Onset  . Breast cancer Paternal Aunt   . Breast cancer Paternal Grandmother   . Lung cancer Maternal Grandfather   . Heart attack Mother   . Stroke Mother   . Multiple myeloma Mother   . Hyperlipidemia Father   . Heart attack Sister   . Heart attack Maternal Uncle   . Heart attack Paternal Grandfather     Social History   Social History  . Marital status: Married    Spouse name: N/A  . Number of children: N/A  . Years of education: N/A   Occupational History  . Not on file.   Social History Main Topics  . Smoking status: Former Smoker    Packs/day: 1.00    Years: 10.00    Types: Cigarettes    Quit date: 02/19/1993  . Smokeless tobacco: Never Used  . Alcohol use 0.0 oz/week     Comment: occasionally  . Drug use: No  . Sexual activity: Yes    Partners: Male   Other Topics Concern  . Not on file   Social History Narrative  . No narrative on file     Current Outpatient Prescriptions:  .  albuterol (PROVENTIL HFA;VENTOLIN HFA) 108 (90 Base) MCG/ACT inhaler, Inhale 2 puffs into  the lungs every 6 (six) hours as needed for wheezing or shortness of breath., Disp: 3 Inhaler, Rfl: 0 .  ALPRAZolam (XANAX) 0.5 MG tablet, Take 1 tablet (0.5 mg total) by mouth at bedtime as needed for anxiety., Disp: 90 tablet, Rfl: 0 .  Ascorbic Acid (VITAMIN C) 1000 MG tablet, Take 1,000 mg by mouth., Disp: , Rfl:  .  calcium carbonate (OS-CAL) 600 MG TABS tablet, Take 600 mg by mouth daily with breakfast., Disp: , Rfl:  .  Cholecalciferol (VITAMIN D) 1000 UNITS capsule, Take 1,000 Units by mouth daily., Disp: , Rfl:  .  clobetasol ointment (TEMOVATE) 0.05 %, , Disp: , Rfl: 0 .  cyclobenzaprine (FLEXERIL) 10 MG tablet, Take 1 tablet (10 mg total) by mouth at bedtime. Prn, Disp:  90 tablet, Rfl: 1 .  ferrous sulfate 325 (65 FE) MG EC tablet, Take 325 mg by mouth 3 (three) times daily with meals., Disp: , Rfl:  .  fluconazole (DIFLUCAN) 150 MG tablet, Take 1 tablet (150 mg total) by mouth daily as needed., Disp: 9 tablet, Rfl: 1 .  lansoprazole (PREVACID) 30 MG capsule, Take 1 capsule (30 mg total) by mouth 2 (two) times daily before a meal., Disp: 180 capsule, Rfl: 1 .  loratadine (CLARITIN) 10 MG tablet, Take 10 mg by mouth daily., Disp: , Rfl:  .  meloxicam (MOBIC) 15 MG tablet, Take 1 tablet (15 mg total) by mouth daily., Disp: 30 tablet, Rfl: 0 .  mometasone (NASONEX) 50 MCG/ACT nasal spray, USE 2 SPRAYS IN EACH NOSTRIL AT BEDTIME, Disp: 51 g, Rfl: 1 .  montelukast (SINGULAIR) 10 MG tablet, Take 1 tablet (10 mg total) by mouth at bedtime., Disp: 90 tablet, Rfl: 1 .  Multiple Vitamin (MULTIVITAMIN) tablet, Take 1 tablet by mouth daily., Disp: , Rfl:  .  Potassium Aminobenzoate 500 MG CAPS, Take 1 capsule by mouth daily., Disp: , Rfl:  .  promethazine (PHENERGAN) 12.5 MG tablet, TAKE 1 TABLET BY MOUTH EVERY 6 HOURS AS NEEDED FOR MIGRAINES WITH NAUSEA, Disp: 20 tablet, Rfl: 2 .  SUMAtriptan (IMITREX) 100 MG tablet, TAKE 1 TABLET AS NEEDED FOR MIGRAINE EPISODE, NO MORE THAN 2 IN 24 HOURS, Disp: 18  tablet, Rfl: 1 .  vitamin B-12 (CYANOCOBALAMIN) 100 MCG tablet, Take 100 mcg by mouth as directed. , Disp: , Rfl:  .  zolpidem (AMBIEN) 10 MG tablet, Must be [employer], Disp: 90 tablet, Rfl: 0 .  zonisamide (ZONEGRAN) 100 MG capsule, Take 1 capsule by mouth every evening., Disp: , Rfl: 1  Allergies  Allergen Reactions  . Linzess [Linaclotide]     Worsening of diarrhea  . Other Hives and Other (See Comments)    Ricotta Cheese: flushed and hot Ricotta Cheese: flushed and hot Ricotta Cheese: flushed and hot  . Cymbalta [Duloxetine Hcl] Palpitations    And photosensitivity  . Vicodin [Hydrocodone-Acetaminophen] Itching     ROS  Constitutional: Negative for fever or significant  weight change.  Respiratory: Negative for cough and shortness of breath.   Cardiovascular: Negative for chest pain or palpitations.  Gastrointestinal: Negative for abdominal pain, no bowel changes.  Musculoskeletal: Negative for gait problem or joint swelling.  Skin: Negative for rash.  Neurological: Negative for dizziness, positive for migraine  Headaches intermittent  No other specific complaints in a complete review of systems (except as listed in HPI above).  Objective  Vitals:   11/11/15 0758  BP: 112/70  Pulse: 95  Resp: 18  Temp: 98.4 F (36.9 C)  TempSrc: Oral  SpO2: 97%  Weight: 168 lb 3 oz (76.3 kg)  Height: 5' 1"  (1.549 m)    Body mass index is 31.78 kg/m.  Physical Exam  Constitutional: Patient appears well-developed and well-nourished. Obese No distress.  HEENT: head atraumatic, normocephalic, pupils equal and reactive to light,  neck supple, throat within normal limits Cardiovascular: Normal rate, regular rhythm and normal heart sounds.  No murmur heard. No BLE edema. Pulmonary/Chest: Effort normal and breath sounds normal. No respiratory distress. Abdominal: Soft.  There is no tenderness. Psychiatric: Patient has a normal mood and affect. behavior is normal. Judgment and  thought content normal. Muscular Skeletal: normal rom of lumbar spine, but pain with extension, negative straight leg raise, pain during palpation of lumbar spine, worse on right  side   Diabetic Foot Exam: Diabetic Foot Exam - Simple   Simple Foot Form Diabetic Foot exam was performed with the following findings:  Yes 11/11/2015  8:29 AM  Visual Inspection No deformities, no ulcerations, no other skin breakdown bilaterally:  Yes Sensation Testing Intact to touch and monofilament testing bilaterally:  Yes Pulse Check Posterior Tibialis and Dorsalis pulse intact bilaterally:  Yes Comments      PHQ2/9: Depression screen [facility] 2/9 11/11/2015 08/08/2015 05/02/2015 01/07/2015 11/05/2014  Decreased Interest 0 0 0 0 1  Down, Depressed, Hopeless 0 0 0 0 1  PHQ - 2 Score 0 0 0 0 2  Altered sleeping - - - - 3  Tired, decreased energy - - - - 3  Change in appetite - - - - 2  Feeling bad or failure about yourself  - - - - 0  Trouble concentrating - - - - 3  Moving slowly or fidgety/restless - - - - 1  Suicidal thoughts - - - - 0  PHQ-9 Score - - - - 14  Difficult doing work/chores - - - - Not difficult at all     Fall Risk: Fall Risk  11/11/2015 08/08/2015 05/02/2015 01/07/2015 11/05/2014  Falls in the past year? No No No No No      Functional Status Survey: Is the patient deaf or have difficulty hearing?: No Does the patient have difficulty seeing, even when wearing glasses/contacts?: No Does the patient have difficulty concentrating, remembering, or making decisions?: No Does the patient have difficulty walking or climbing stairs?: No Does the patient have difficulty dressing or bathing?: No Does the patient have difficulty doing errands alone such as visiting a doctor's office or shopping?: No    Assessment & Plan  1. Chronic insomnia  - zolpidem (AMBIEN) 10 MG tablet; Must be [employer]  Dispense: 90 tablet; Refill: 0  2. History of bariatric surgery  Recheck labs  3.  Thrombocytosis (HCC)  - CBC with Differential/Platelet  4. Iron deficiency anemia  Recheck labs  5. Asthma, mild persistent, uncomplicated  Continue medication   6. GERD without esophagitis  - lansoprazole (PREVACID) 30 MG capsule; Take 1 capsule (30 mg total) by mouth 2 (two) times daily before a meal.  Dispense: 180 capsule; Refill: 1  7. History of hyperlipidemia  - Lipid panel  8. Vitamin D deficiency  - VITAMIN D 25 Hydroxy (Vit-D Deficiency, Fractures)  9. Other fatigue  - Vitamin B12 - TSH - CBC with Differential/Platelet - COMPLETE METABOLIC PANEL WITH GFR  10. Diet-controlled diabetes mellitus ([place])  - Hemoglobin A1c - POCT UA - Microalbumin  11. Depression with anxiety  Stable, taking alprazolam every night, advised to try to take prn, and make it last longer  12. Long-term use of high-risk medication  - COMPLETE METABOLIC PANEL WITH GFR  13. Migraine with aura and without status migrainosus, not intractable  - cyclobenzaprine (FLEXERIL) 10 MG tablet; Take 1 tablet (10 mg total) by mouth at bedtime. Prn  Dispense: 90 tablet; Refill: 1  14. Recurrent vaginitis  - fluconazole (DIFLUCAN) 150 MG tablet; Take 1 tablet (150 mg total) by mouth daily as needed.  Dispense: 9 tablet; Refill: 1

## 2015-11-12 LAB — HEMOGLOBIN A1C
Hgb A1c MFr Bld: 5.2 % (ref <5.7)
MEAN PLASMA GLUCOSE: 103 mg/dL

## 2015-11-12 LAB — VITAMIN D 25 HYDROXY (VIT D DEFICIENCY, FRACTURES): Vit D, 25-Hydroxy: 23 ng/mL — ABNORMAL LOW (ref 30–100)

## 2015-11-13 ENCOUNTER — Other Ambulatory Visit: Payer: Self-pay | Admitting: Family Medicine

## 2015-11-13 MED ORDER — VITAMIN D (ERGOCALCIFEROL) 1.25 MG (50000 UNIT) PO CAPS: 50000.0000 [IU] | ORAL_CAPSULE | ORAL | 0 refills | Status: DC

## 2015-12-16 ENCOUNTER — Other Ambulatory Visit: Payer: Self-pay | Admitting: Orthopedic Surgery

## 2015-12-16 DIAGNOSIS — M5416 Radiculopathy, lumbar region: Secondary | ICD-10-CM

## 2015-12-28 ENCOUNTER — Ambulatory Visit
Admission: RE | Admit: 2015-12-28 | Discharge: 2015-12-28 | Disposition: A | Discharge status: Home / Self Care | Payer: BLUE CROSS/BLUE SHIELD | Source: Ambulatory Visit | Attending: Orthopedic Surgery | Admitting: Orthopedic Surgery

## 2015-12-28 DIAGNOSIS — M5416 Radiculopathy, lumbar region: Secondary | ICD-10-CM | POA: Diagnosis not present

## 2015-12-28 DIAGNOSIS — M5137 Other intervertebral disc degeneration, lumbosacral region: Secondary | ICD-10-CM | POA: Insufficient documentation

## 2015-12-28 DIAGNOSIS — M5136 Other intervertebral disc degeneration, lumbar region: Secondary | ICD-10-CM | POA: Diagnosis not present

## 2016-02-01 ENCOUNTER — Emergency Department: Payer: BLUE CROSS/BLUE SHIELD

## 2016-02-01 ENCOUNTER — Emergency Department
Admission: EM | Admit: 2016-02-01 | Discharge: 2016-02-01 | Disposition: A | Discharge status: Home / Self Care | Payer: BLUE CROSS/BLUE SHIELD | Attending: Emergency Medicine | Admitting: Emergency Medicine

## 2016-02-01 DIAGNOSIS — E119 Type 2 diabetes mellitus without complications: Secondary | ICD-10-CM | POA: Diagnosis not present

## 2016-02-01 DIAGNOSIS — J45909 Unspecified asthma, uncomplicated: Secondary | ICD-10-CM | POA: Insufficient documentation

## 2016-02-01 DIAGNOSIS — Z79899 Other long term (current) drug therapy: Secondary | ICD-10-CM | POA: Insufficient documentation

## 2016-02-01 DIAGNOSIS — J069 Acute upper respiratory infection, unspecified: Secondary | ICD-10-CM | POA: Diagnosis not present

## 2016-02-01 DIAGNOSIS — R05 Cough: Secondary | ICD-10-CM | POA: Diagnosis present

## 2016-02-01 DIAGNOSIS — Z87891 Personal history of nicotine dependence: Secondary | ICD-10-CM | POA: Insufficient documentation

## 2016-02-01 DIAGNOSIS — R55 Syncope and collapse: Secondary | ICD-10-CM | POA: Insufficient documentation

## 2016-02-01 LAB — CBC
HCT: 42.6 % (ref 35.0–47.0)
HEMOGLOBIN: 14.2 g/dL (ref 12.0–16.0)
MCH: 29 pg (ref 26.0–34.0)
MCHC: 33.3 g/dL (ref 32.0–36.0)
MCV: 87.1 fL (ref 80.0–100.0)
PLATELETS: 278 10*3/uL (ref 150–440)
RBC: 4.89 MIL/uL (ref 3.80–5.20)
RDW: 14 % (ref 11.5–14.5)
WBC: 9.9 10*3/uL (ref 3.6–11.0)

## 2016-02-01 LAB — BASIC METABOLIC PANEL
ANION GAP: 4 — AB (ref 5–15)
BUN: 10 mg/dL (ref 6–20)
CALCIUM: 8.7 mg/dL — AB (ref 8.9–10.3)
CHLORIDE: 109 mmol/L (ref 101–111)
CO2: 22 mmol/L (ref 22–32)
Creatinine, Ser: 0.98 mg/dL (ref 0.44–1.00)
GFR calc non Af Amer: >60 mL/min (ref >60)
Glucose, Bld: 125 mg/dL — ABNORMAL HIGH (ref 65–99)
Potassium: 3.6 mmol/L (ref 3.5–5.1)
SODIUM: 135 mmol/L (ref 135–145)

## 2016-02-01 LAB — URINALYSIS, COMPLETE (UACMP) WITH MICROSCOPIC
BILIRUBIN URINE: NEGATIVE
GLUCOSE, UA: NEGATIVE mg/dL
HGB URINE DIPSTICK: NEGATIVE
KETONES UR: NEGATIVE mg/dL
LEUKOCYTES UA: NEGATIVE
Nitrite: NEGATIVE
PH: 6 (ref 5.0–8.0)
PROTEIN: 30 mg/dL — AB
Specific Gravity, Urine: 1.016 (ref 1.005–1.030)

## 2016-02-01 LAB — TROPONIN I: Troponin I: <0.03 ng/mL (ref <0.03)

## 2016-02-01 LAB — GLUCOSE, CAPILLARY: Glucose-Capillary: 119 mg/dL — ABNORMAL HIGH (ref 65–99)

## 2016-02-01 MED ORDER — SODIUM CHLORIDE 0.9 % IV BOLUS (SEPSIS)
1000.0000 mL | Freq: Once | INTRAVENOUS | Status: AC
  Administered 2016-02-01: 1000 mL via INTRAVENOUS

## 2016-02-01 MED ORDER — GUAIFENESIN-CODEINE 100-10 MG/5ML PO SOLN: 5.0000 mL | Freq: Four times a day (QID) | ORAL | 0 refills | Status: DC | PRN

## 2016-02-01 NOTE — ED Triage Notes (Signed)
Pt c/o URI X 4 days, went to bathroom today and had syncopal episode. Fell to side, did not hit head. Pt alert and oriented X4, active, cooperative, pt in NAD. RR even and unlabored, color WNL.

## 2016-02-01 NOTE — ED Notes (Signed)
Pt states cough and sore throat x 4 days. No N&V. Was eating a small cup of soup because hasn't had much appetite. Walked from kitchen to bathroom after eating cup of soup, sat on toilet, got sweaty and passed out. Denies hitting head. Unsure how low she was out. States she fell on side on to a pile of toilet paper. Tried to get appt with PCP and walk in clinic today with no luck. Pt states she knew she was going to pass out, denies straining to use bathroom. Hx of passing out from "gas pains".

## 2016-02-01 NOTE — ED Provider Notes (Signed)
[facility] Emergency Department Provider Note  Time seen: 6:20 PM  I have reviewed the triage vital signs and the nursing notes.   HISTORY  Chief Complaint Loss of Consciousness    HPI Claire [name] is a 45 y.o. female who presents to the emergency Department after syncopal episode. According to the patient for the past 4 days she has been coughing, with a sore throat, nasal congestion. She states today she was eating when she had a feeling of an upset stomach like she needed to have diarrhea. Patient went to the bathroom was able to have a bowel movement however during her bowel movement became very sweaty, lightheaded and felt ringing in her ears. Patient states she slumped over on the toilet and briefly lost consciousness. Denies hitting her head. States her head landed on a package of toilet paper. Patient states after several minutes she felt much better. She states she has passed out in the past due to "gas pains." Currently the patient appears well, denies any chest pain or trouble breathing now or at any point. Patient states she was going to see her doctor today anyways for the continued cough and congestion so she decided to go to the walk-in clinic for an evaluation. However as the patient passed out a center to the ER for evaluation. Currently the patient states she feels well, denies any complaints at this time.  Past Medical History:  Diagnosis Date  . Anemia   . Anxiety   . Asthma   . Bilateral leg cramps   . Depression   . Diabetes mellitus without complication ([place])    history no longer a problem since weight loss surgery  . DJD (degenerative joint disease)   . GERD (gastroesophageal reflux disease)   . Headache    Migraines  . IBS (irritable bowel syndrome)   . Kidney stone   . Lung nodules   . Muscle spasm   . Psoriasis    vaginal area  . Seasonal allergies   . Sleep apnea    history no longer a problem since weight loss surgery  .  Vitamin D deficiency     Patient Active Problem List   Diagnosis Date Noted  . Iron deficiency anemia 08/08/2015  . Lumbosacral pain 01/07/2015  . Muscle twitching 01/07/2015  . Intertrigo 11/05/2014  . Perennial allergic rhinitis 09/03/2014  . Lung nodules 09/03/2014  . Vitamin D deficiency 09/03/2014  . Diet-controlled diabetes mellitus ([place]) 09/03/2014  . History of kidney stones 09/03/2014  . Chronic insomnia 09/03/2014  . History of iron deficiency anemia 09/03/2014  . Depression with anxiety 09/03/2014  . Palpitations 06/22/2014  . History of bariatric surgery 12/03/2013  . Body mass index 27.0-27.9, adult 12/03/2013  . Asthma, mild persistent 11/21/2012  . CN (constipation) 11/21/2012  . GERD without esophagitis 11/21/2012  . History of hyperlipidemia 11/21/2012  . Migraine with aura without status migrainosus 11/21/2012  . History of sleep apnea 11/21/2012  . Morbid obesity ([place]) 11/21/2012  . IBS (irritable bowel syndrome) 08/15/2012    Past Surgical History:  Procedure Laterality Date  . BLADDER SURGERY  2010  . BREAST BIOPSY Right 2014   stereotatic biopsy  . CHOLECYSTECTOMY  2010  . COLONOSCOPY  2014    Done at [facility]  . DILITATION & CURRETTAGE/HYSTROSCOPY WITH NOVASURE ABLATION N/A 06/16/2015   Procedure: DILATATION & CURETTAGE/HYSTEROSCOPY WITH NOVASURE ABLATION;  Surgeon: Brien Few, MD;  Location: [place] [facility];  Service: Gynecology;  Laterality: N/A;  .  [place] RESECTION  2012  . LAPAROSCOPY N/A 06/16/2015   Procedure: LAPAROSCOPY DIAGNOSTIC;  Surgeon: Brien Few, MD;  Location: [place] [facility];  Service: Gynecology;  Laterality: N/A;  . LYSIS OF ADHESION N/A 06/16/2015   Procedure: LYSIS OF ADHESION;  Surgeon: Brien Few, MD;  Location: [place] [facility];  Service: Gynecology;  Laterality: N/A;  . PLANTAR FASCIA SURGERY Bilateral   . ROBOTIC ASSISTED SALPINGO OOPHERECTOMY Right 06/16/2015   Procedure: ROBOTIC ASSISTED SALPINGO OOPHORECTOMY, EXCISION  OF RIGHT CUL DE [place] MASS;  Surgeon: Brien Few, MD;  Location: [place] [facility];  Service: Gynecology;  Laterality: Right;  . TONSILLECTOMY    . UPPER GI ENDOSCOPY      Prior to Admission medications   Medication Sig Start Date End Date Taking? Authorizing Provider  albuterol (PROVENTIL HFA;VENTOLIN HFA) 108 (90 Base) MCG/ACT inhaler Inhale 2 puffs into the lungs every 6 (six) hours as needed for wheezing or shortness of breath. 08/08/15   Steele Sizer, MD  ALPRAZolam Duanne Moron) 0.5 MG tablet Take 1 tablet (0.5 mg total) by mouth at bedtime as needed for anxiety. 10/14/15   Steele Sizer, MD  Ascorbic Acid (VITAMIN C) 1000 MG tablet Take 1,000 mg by mouth.    Historical Provider, MD  calcium carbonate (OS-CAL) 600 MG TABS tablet Take 600 mg by mouth daily with breakfast.    Historical Provider, MD  Cholecalciferol (VITAMIN D) 1000 UNITS capsule Take 1,000 Units by mouth daily.    Historical Provider, MD  clobetasol ointment (TEMOVATE) 0.05 %  06/06/15   Historical Provider, MD  cyclobenzaprine (FLEXERIL) 10 MG tablet Take 1 tablet (10 mg total) by mouth at bedtime. Prn 11/11/15   Steele Sizer, MD  ferrous sulfate 325 (65 FE) MG EC tablet Take 325 mg by mouth 3 (three) times daily with meals.    Historical Provider, MD  fluconazole (DIFLUCAN) 150 MG tablet Take 1 tablet (150 mg total) by mouth daily as needed. 11/11/15   Steele Sizer, MD  lansoprazole (PREVACID) 30 MG capsule Take 1 capsule (30 mg total) by mouth 2 (two) times daily before a meal. 11/11/15   Steele Sizer, MD  loratadine (CLARITIN) 10 MG tablet Take 10 mg by mouth daily.    Historical Provider, MD  meloxicam (MOBIC) 15 MG tablet Take 1 tablet (15 mg total) by mouth daily. 11/11/15   Steele Sizer, MD  mometasone (NASONEX) 50 MCG/ACT nasal spray USE 2 SPRAYS IN EACH NOSTRIL AT BEDTIME 05/02/15   Steele Sizer, MD  montelukast (SINGULAIR) 10 MG tablet Take 1 tablet (10 mg total) by mouth at bedtime. 08/08/15   Steele Sizer, MD  Multiple  Vitamin (MULTIVITAMIN) tablet Take 1 tablet by mouth daily.    Historical Provider, MD  Potassium Aminobenzoate 500 MG CAPS Take 1 capsule by mouth daily.    Historical Provider, MD  promethazine (PHENERGAN) 12.5 MG tablet TAKE 1 TABLET BY MOUTH EVERY 6 HOURS AS NEEDED FOR MIGRAINES WITH NAUSEA 12/17/14   Steele Sizer, MD  SUMAtriptan (IMITREX) 100 MG tablet TAKE 1 TABLET AS NEEDED FOR MIGRAINE EPISODE, NO MORE THAN 2 IN 24 HOURS 08/18/15   Steele Sizer, MD  vitamin B-12 (CYANOCOBALAMIN) 100 MCG tablet Take 100 mcg by mouth as directed.     Historical Provider, MD  Vitamin D, Ergocalciferol, (DRISDOL) 50000 units CAPS capsule Take 1 capsule (50,000 Units total) by mouth every 7 (seven) days. 11/13/15   Steele Sizer, MD  zolpidem (AMBIEN) 10 MG tablet Must be [employer] 11/11/15   Steele Sizer,  MD  zonisamide (ZONEGRAN) 100 MG capsule Take 1 capsule by mouth every evening. 11/30/14   Orie Rout, MD    Allergies  Allergen Reactions  . Linzess [Linaclotide]     Worsening of diarrhea  . Other Hives and Other (See Comments)    Ricotta Cheese: flushed and hot Ricotta Cheese: flushed and hot Ricotta Cheese: flushed and hot  . Cymbalta [Duloxetine Hcl] Palpitations    And photosensitivity  . Vicodin [Hydrocodone-Acetaminophen] Itching    Family History  Problem Relation Age of Onset  . Heart attack Mother   . Stroke Mother   . Multiple myeloma Mother   . Hyperlipidemia Father   . Heart attack Sister   . Heart attack Maternal Uncle   . Heart attack Paternal Grandfather   . Breast cancer Paternal Aunt   . Breast cancer Paternal Grandmother   . Lung cancer Maternal Grandfather     Social History Social History  Substance Use Topics  . Smoking status: Former Smoker    Packs/day: 1.00    Years: 10.00    Types: Cigarettes    Quit date: 02/19/1993  . Smokeless tobacco: Never Used  . Alcohol use 0.0 oz/week     Comment: occasionally    Review of  Systems Constitutional: Negative for fever. Cardiovascular: Negative for chest pain. Respiratory: Negative for shortness of breath. Gastrointestinal: Negative for abdominal pain Musculoskeletal: Negative for back pain. Neurological: Negative for headache 10-point ROS otherwise negative.  ____________________________________________   PHYSICAL EXAM:  VITAL SIGNS: ED Triage Vitals  Enc Vitals Group     BP 02/01/16 1703 117/84     Pulse Rate 02/01/16 1703 97     Resp 02/01/16 1703 18     Temp 02/01/16 1704 98.1 F (36.7 C)     Temp Source 02/01/16 1703 Oral     SpO2 02/01/16 1703 100 %     Weight 02/01/16 1704 166 lb (75.3 kg)     Height 02/01/16 1704 5' 1" (1.549 m)     Head Circumference --      Peak Flow --      Pain Score 02/01/16 1704 5     Pain Loc --      Pain Edu? --      Excl. in [place]? --     Constitutional: Alert and oriented. Well appearing and in no distress. Eyes: Normal exam ENT   Head: Normocephalic and atraumatic.   Mouth/Throat: Mucous membranes are moist.Minimal pharyngeal erythema, no tonsillar hypertrophy or exudate. No cervical lymphadenopathy noted. No sinus tenderness to percussion. Cardiovascular: Normal rate, regular rhythm. No murmur Respiratory: Normal respiratory effort without tachypnea nor retractions. Breath sounds are clear. Occasional cough during examination. Gastrointestinal: Soft and nontender. No distention.   Musculoskeletal: Nontender with normal range of motion in all extremities.  Neurologic:  Normal speech and language. No gross focal neurologic deficits Skin:  Skin is warm, dry and intact.  Psychiatric: Mood and affect are normal. Speech and behavior are normal.   ____________________________________________    EKG  EKG reviewed and interpreted by myself shows normal sinus rhythm at 82 bpm, narrow QRS, normal axis, normal intervals, nonspecific but no concerning ST  changes.  ____________________________________________    RADIOLOGY  Chest x-ray negative  ____________________________________________   INITIAL IMPRESSION / ASSESSMENT AND PLAN / ED COURSE  Pertinent labs & imaging results that were available during my care of the patient were reviewed by me and considered in my medical decision making (see chart for details).  The  patient presents to the emergency department 4 days of upper respiratory infection symptoms, and a syncopal episode this afternoon. Patient syncopal episode sounds very consistent with a likely vasovagal episode. We will check labs including troponin. The episode occurred several hours ago. Patient is EKG shows no acute concerning findings. We will IV hydrate, tach chest x-ray and closely monitor in the emergency department.  Patient's labs are largely within normal limits. Normal white blood cell count. No obvious signs of dehydration on the lab work. Troponin is negative. Currently pending chest x-ray. Patient receiving IV hydration at this time.  Chest x-ray negative. Labs are within normal limits. We will discharge with cough medication, and supportive care instructions for likely viral upper respiratory infection. Highly suspect vasovagal syncope episode. Patient agreeable to plan.  ____________________________________________   FINAL CLINICAL IMPRESSION(S) / ED DIAGNOSES  Syncope Upper respiratory infection    Harvest Dark, MD 02/01/16 [phone]

## 2016-02-10 ENCOUNTER — Other Ambulatory Visit: Payer: Self-pay | Admitting: Family Medicine

## 2016-02-10 DIAGNOSIS — J4540 Moderate persistent asthma, uncomplicated: Secondary | ICD-10-CM

## 2016-02-22 ENCOUNTER — Encounter: Payer: Self-pay | Admitting: Family Medicine

## 2016-02-22 ENCOUNTER — Ambulatory Visit (INDEPENDENT_AMBULATORY_CARE_PROVIDER_SITE_OTHER): Payer: BLUE CROSS/BLUE SHIELD | Admitting: Family Medicine

## 2016-02-22 VITALS — BP 118/68 | HR 94 | Temp 97.6°F | Resp 16 | Ht 61.0 in | Wt 169.4 lb

## 2016-02-22 DIAGNOSIS — F5104 Psychophysiologic insomnia: Secondary | ICD-10-CM | POA: Diagnosis not present

## 2016-02-22 DIAGNOSIS — E559 Vitamin D deficiency, unspecified: Secondary | ICD-10-CM

## 2016-02-22 DIAGNOSIS — F418 Other specified anxiety disorders: Secondary | ICD-10-CM | POA: Diagnosis not present

## 2016-02-22 DIAGNOSIS — E119 Type 2 diabetes mellitus without complications: Secondary | ICD-10-CM | POA: Diagnosis not present

## 2016-02-22 DIAGNOSIS — G43109 Migraine with aura, not intractable, without status migrainosus: Secondary | ICD-10-CM | POA: Diagnosis not present

## 2016-02-22 DIAGNOSIS — J4530 Mild persistent asthma, uncomplicated: Secondary | ICD-10-CM

## 2016-02-22 DIAGNOSIS — Z9884 Bariatric surgery status: Secondary | ICD-10-CM | POA: Diagnosis not present

## 2016-02-22 MED ORDER — ZOLPIDEM TARTRATE 10 MG PO TABS
ORAL_TABLET | ORAL | 0 refills | Status: DC
Start: 2016-02-22 — End: 2016-05-16

## 2016-02-22 NOTE — Progress Notes (Signed)
Name: Claire [name]   MRN: [id number]    DOB: 1970/12/01   Date:02/22/2016       Progress Note  Subjective  Chief Complaint  Chief Complaint  Patient presents with  . Insomnia    no issues   . Asthma    no issues has used inhaler more frequently within the last month  . Anxiety  . Depression    HPI   Back pain: she states back pain got worse with tingling and numbness right leg, seen by [facility], and is seeing Dr. Dorcas Mcmurray, she took some Tramadol and is now on Gabapentin. It has improved the tingling, but she is only taking two daily at this time, titrating slowly. No bowel or bladder incontinence.  Depression/Anxiety: she stopped medication months ago, medications was making numb. She is feeling well, no crying spells through the holidays, doing better in respect of her patient. Doing well at work and normal focus. Last rx of Alprazolam was filled back 09/2015. She is currently taking medication prn when very stressed out. Explained that she needs to try mindfulness and void taking alprazolam for normal life stress.  Insomnia: taking medication still wakes up at 2 to 3 am but able to fall back asleep. Still taking Ambien, unable to go down on dose of Ambien. Explained that Alprazolam and Ambien are not advised to be taken together.   Asthma Mild Persistent: she is doing well now, she had to use inhaler when she had to go to [facility] Dec 2017 with an URI and asthma flare  Migraine: she is still seeing Dr. Domingo Cocking - now off Topamax and is on Zonegran, and is doing well at this time, no pain at this time   DMII: diagnosed at age 18, took medication for a while, but stopped all medications 10/25/2010 after bariatric surgery, glucose has been controlled, no polyphagia, polyuria or polydipsia. Maximum weight of 265 lbs and is down to 168.3lbs. She is still trying to lose more weight. Due for eye exam  Patient Active Problem List   Diagnosis Date Noted  . Iron deficiency anemia  08/08/2015  . Lumbosacral pain 01/07/2015  . Muscle twitching 01/07/2015  . Intertrigo 11/05/2014  . Perennial allergic rhinitis 09/03/2014  . Lung nodules 09/03/2014  . Vitamin D deficiency 09/03/2014  . Diet-controlled diabetes mellitus ([place]) 09/03/2014  . History of kidney stones 09/03/2014  . Chronic insomnia 09/03/2014  . History of iron deficiency anemia 09/03/2014  . Depression with anxiety 09/03/2014  . Palpitations 06/22/2014  . History of bariatric surgery 12/03/2013  . Body mass index 27.0-27.9, adult 12/03/2013  . Asthma, mild persistent 11/21/2012  . CN (constipation) 11/21/2012  . GERD without esophagitis 11/21/2012  . History of hyperlipidemia 11/21/2012  . Migraine with aura and without status migrainosus 11/21/2012  . History of sleep apnea 11/21/2012  . Morbid obesity ([place]) 11/21/2012  . IBS (irritable bowel syndrome) 08/15/2012    Past Surgical History:  Procedure Laterality Date  . BLADDER SURGERY  2010  . BREAST BIOPSY Right 2014   stereotatic biopsy  . CHOLECYSTECTOMY  2010  . COLONOSCOPY  2014    Done at [facility]  . DILITATION & CURRETTAGE/HYSTROSCOPY WITH NOVASURE ABLATION N/A 06/16/2015   Procedure: DILATATION & CURETTAGE/HYSTEROSCOPY WITH NOVASURE ABLATION;  Surgeon: Brien Few, MD;  Location: [place] [facility];  Service: Gynecology;  Laterality: N/A;  . [place] RESECTION  2012  . LAPAROSCOPY N/A 06/16/2015   Procedure: LAPAROSCOPY DIAGNOSTIC;  Surgeon: Brien Few, MD;  Location:  [facility];  Service: Gynecology;  Laterality: N/A;  . LYSIS OF ADHESION N/A 06/16/2015   Procedure: LYSIS OF ADHESION;  Surgeon: Olivia Mackie, MD;  Location: [facility];  Service: Gynecology;  Laterality: N/A;  . PLANTAR FASCIA SURGERY Bilateral   . ROBOTIC ASSISTED SALPINGO OOPHERECTOMY Right 06/16/2015   Procedure: ROBOTIC ASSISTED SALPINGO OOPHORECTOMY, EXCISION OF RIGHT CUL DE [place] MASS;  Surgeon: Olivia Mackie, MD;  Location: [facility];  Service: Gynecology;   Laterality: Right;  . TONSILLECTOMY    . UPPER GI ENDOSCOPY      Family History  Problem Relation Age of Onset  . Heart attack Mother   . Stroke Mother   . Multiple myeloma Mother   . Hyperlipidemia Father   . Heart attack Sister   . Heart attack Maternal Uncle   . Heart attack Paternal Grandfather   . Breast cancer Paternal Aunt   . Breast cancer Paternal Grandmother   . Lung cancer Maternal Grandfather     Social History   Social History  . Marital status: Married    Spouse name: N/A  . Number of children: N/A  . Years of education: N/A   Occupational History  . Not on file.   Social History Main Topics  . Smoking status: Former Smoker    Packs/day: 1.00    Years: 10.00    Types: Cigarettes    Quit date: 02/19/1993  . Smokeless tobacco: Never Used  . Alcohol use 0.0 oz/week     Comment: occasionally  . Drug use: No  . Sexual activity: Yes    Partners: Male   Other Topics Concern  . Not on file   Social History Narrative  . No narrative on file     Current Outpatient Prescriptions:  .  ALPRAZolam (XANAX) 0.5 MG tablet, Take 1 tablet (0.5 mg total) by mouth at bedtime as needed for anxiety., Disp: 90 tablet, Rfl: 0 .  Ascorbic Acid (VITAMIN C) 1000 MG tablet, Take 1,000 mg by mouth., Disp: , Rfl:  .  calcium carbonate (OS-CAL) 600 MG TABS tablet, Take 600 mg by mouth daily with breakfast., Disp: , Rfl:  .  Cholecalciferol (VITAMIN D) 1000 UNITS capsule, Take 1,000 Units by mouth daily., Disp: , Rfl:  .  clobetasol ointment (TEMOVATE) 0.05 %, , Disp: , Rfl: 0 .  cyclobenzaprine (FLEXERIL) 10 MG tablet, Take 1 tablet (10 mg total) by mouth at bedtime. Prn, Disp: 90 tablet, Rfl: 1 .  ferrous sulfate 325 (65 FE) MG EC tablet, Take 325 mg by mouth 3 (three) times daily with meals., Disp: , Rfl:  .  fluconazole (DIFLUCAN) 150 MG tablet, Take 1 tablet (150 mg total) by mouth daily as needed., Disp: 9 tablet, Rfl: 1 .  gabapentin (NEURONTIN) 300 MG capsule, Take 1  capsule by mouth 3 (three) times daily., Disp: , Rfl: 6 .  lansoprazole (PREVACID) 30 MG capsule, Take 1 capsule (30 mg total) by mouth 2 (two) times daily before a meal., Disp: 180 capsule, Rfl: 1 .  loratadine (CLARITIN) 10 MG tablet, Take 10 mg by mouth daily., Disp: , Rfl:  .  mometasone (NASONEX) 50 MCG/ACT nasal spray, USE 2 SPRAYS IN EACH NOSTRIL AT BEDTIME, Disp: 51 g, Rfl: 1 .  montelukast (SINGULAIR) 10 MG tablet, Take 1 tablet (10 mg total) by mouth at bedtime., Disp: 90 tablet, Rfl: 1 .  Multiple Vitamin (MULTIVITAMIN) tablet, Take 1 tablet by mouth daily., Disp: , Rfl:  .  Potassium Aminobenzoate 500 MG CAPS,  Take 1 capsule by mouth daily., Disp: , Rfl:  .  promethazine (PHENERGAN) 12.5 MG tablet, TAKE 1 TABLET BY MOUTH EVERY 6 HOURS AS NEEDED FOR MIGRAINES WITH NAUSEA, Disp: 20 tablet, Rfl: 2 .  SUMAtriptan (IMITREX) 100 MG tablet, TAKE 1 TABLET AS NEEDED FOR MIGRAINE EPISODE, NO MORE THAN 2 IN 24 HOURS, Disp: 18 tablet, Rfl: 1 .  VENTOLIN HFA 108 (90 Base) MCG/ACT inhaler, USE 2 INHALATIONS EVERY 6 HOURS AS NEEDED FOR WHEEZING OR SHORTNESS OF BREATH, Disp: 54 g, Rfl: 0 .  vitamin B-12 (CYANOCOBALAMIN) 100 MCG tablet, Take 100 mcg by mouth as directed. , Disp: , Rfl:  .  Vitamin D, Ergocalciferol, (DRISDOL) 50000 units CAPS capsule, TAKE 1 CAPSULE EVERY 7 DAYS, Disp: 12 capsule, Rfl: 0 .  zolpidem (AMBIEN) 10 MG tablet, Must be [employer], Disp: 90 tablet, Rfl: 0 .  zonisamide (ZONEGRAN) 50 MG capsule, Take 3 capsules by mouth daily., Disp: , Rfl: 2  Allergies  Allergen Reactions  . Linzess [Linaclotide]     Worsening of diarrhea  . Other Hives and Other (See Comments)    Ricotta Cheese: flushed and hot Ricotta Cheese: flushed and hot Ricotta Cheese: flushed and hot  . Cymbalta [Duloxetine Hcl] Palpitations    And photosensitivity  . Vicodin [Hydrocodone-Acetaminophen] Itching     ROS  Ten systems reviewed and is negative except as mentioned in HPI    Objective  Vitals:   02/22/16 0934  BP: 118/68  Pulse: 94  Resp: 16  Temp: 97.6 F (36.4 C)  SpO2: 99%  Weight: 169 lb 6 oz (76.8 kg)  Height: 5\' 1"  (1.549 m)    Body mass index is 32 kg/m.  Physical Exam  Constitutional: Patient appears well-developed and well-nourished. Obese  No distress.  HEENT: head atraumatic, normocephalic, pupils equal and reactive to light, neck supple, throat within normal limits Cardiovascular: Normal rate, regular rhythm and normal heart sounds.  No murmur heard. No BLE edema. Pulmonary/Chest: Effort normal and breath sounds normal. No respiratory distress. Abdominal: Soft.  There is no tenderness. Psychiatric: Patient has a normal mood and affect. behavior is normal. Judgment and thought content normal.  Recent Results (from the past 2160 hour(s))  Basic metabolic panel     Status: Abnormal   Collection Time: 02/01/16  5:07 PM  Result Value Ref Range   Sodium 135 135 - 145 mmol/L   Potassium 3.6 3.5 - 5.1 mmol/L   Chloride 109 101 - 111 mmol/L   CO2 22 22 - 32 mmol/L   Glucose, Bld 125 (H) 65 - 99 mg/dL   BUN 10 6 - 20 mg/dL   Creatinine, Ser 02/03/16 0.44 - 1.00 mg/dL   Calcium 8.7 (L) 8.9 - 10.3 mg/dL   GFR calc non Af Amer >60 >60 mL/min   GFR calc Af Amer >60 >60 mL/min    Comment: (NOTE) The eGFR has been calculated using the CKD EPI equation. This calculation has not been validated in all clinical situations. eGFR's persistently <60 mL/min signify possible Chronic Kidney Disease.    Anion gap 4 (L) 5 - 15  CBC     Status: None   Collection Time: 02/01/16  5:07 PM  Result Value Ref Range   WBC 9.9 3.6 - 11.0 K/uL   RBC 4.89 3.80 - 5.20 MIL/uL   Hemoglobin 14.2 12.0 - 16.0 g/dL   HCT 02/03/16 53.9 - 11.3 %   MCV 87.1 80.0 - 100.0 fL   MCH 29.0 26.0 -  34.0 pg   MCHC 33.3 32.0 - 36.0 g/dL   RDW 14.0 11.5 - 14.5 %   Platelets 278 150 - 440 K/uL  Urinalysis, Complete w Microscopic     Status: Abnormal   Collection Time: 02/01/16   5:07 PM  Result Value Ref Range   Color, Urine YELLOW (A) YELLOW   APPearance CLOUDY (A) CLEAR   Specific Gravity, Urine 1.016 1.005 - 1.030   pH 6.0 5.0 - 8.0   Glucose, UA NEGATIVE NEGATIVE mg/dL   Hgb urine dipstick NEGATIVE NEGATIVE   Bilirubin Urine NEGATIVE NEGATIVE   Ketones, ur NEGATIVE NEGATIVE mg/dL   Protein, ur 30 (A) NEGATIVE mg/dL   Nitrite NEGATIVE NEGATIVE   Leukocytes, UA NEGATIVE NEGATIVE   RBC / HPF 0-5 0 - 5 RBC/hpf   WBC, UA 0-5 0 - 5 WBC/hpf   Bacteria, UA RARE (A) NONE SEEN   Squamous Epithelial / LPF 6-30 (A) NONE SEEN   Mucous PRESENT   Troponin I     Status: None   Collection Time: 02/01/16  5:07 PM  Result Value Ref Range   Troponin I <0.03 <0.03 ng/mL  Glucose, capillary     Status: Abnormal   Collection Time: 02/01/16  5:12 PM  Result Value Ref Range   Glucose-Capillary 119 (H) 65 - 99 mg/dL     PHQ2/9: Depression screen [facility] 2/9 02/22/2016 11/11/2015 08/08/2015 05/02/2015 01/07/2015  Decreased Interest 0 0 0 0 0  Down, Depressed, Hopeless 0 0 0 0 0  PHQ - 2 Score 0 0 0 0 0  Altered sleeping - - - - -  Tired, decreased energy - - - - -  Change in appetite - - - - -  Feeling bad or failure about yourself  - - - - -  Trouble concentrating - - - - -  Moving slowly or fidgety/restless - - - - -  Suicidal thoughts - - - - -  PHQ-9 Score - - - - -  Difficult doing work/chores - - - - -     Fall Risk: Fall Risk  02/22/2016 11/11/2015 08/08/2015 05/02/2015 01/07/2015  Falls in the past year? _0      Functional Status Survey: Is the patient deaf or have difficulty hearing?: No Does the patient have difficulty seeing, even when wearing glasses/contacts?: No Does the patient have difficulty concentrating, remembering, or making decisions?: No Does the patient have difficulty walking or climbing stairs?: No Does the patient have difficulty dressing or bathing?: No Does the patient have difficulty doing errands alone such as visiting a  doctor's office or shopping?: No  Assessment & Plan  1. Chronic insomnia  - zolpidem (AMBIEN) 10 MG tablet; Must be [employer]  Dispense: 90 tablet; Refill: 0  2. History of bariatric surgery  On Vitamin D supplementation, last hgb back to normal   3. Vitamin D deficiency   4. Diet-controlled diabetes mellitus ([place])  Doing well, last hgbA1C was at goal   5. Mild persistent asthma without complication  Doing better at this time  6. Migraine with aura and without status migrainosus, not intractable  Doing well, continue folllow up with Dr. Domingo Cocking  7. Depression with anxiety  Doing well at this time, off Citalopram

## 2016-03-03 ENCOUNTER — Other Ambulatory Visit: Payer: Self-pay | Admitting: Family Medicine

## 2016-03-03 DIAGNOSIS — J4530 Mild persistent asthma, uncomplicated: Secondary | ICD-10-CM

## 2016-03-24 LAB — HM DIABETES EYE EXAM

## 2016-05-16 ENCOUNTER — Ambulatory Visit (INDEPENDENT_AMBULATORY_CARE_PROVIDER_SITE_OTHER): Payer: BLUE CROSS/BLUE SHIELD | Admitting: Family Medicine

## 2016-05-16 ENCOUNTER — Telehealth: Payer: Self-pay

## 2016-05-16 ENCOUNTER — Encounter: Payer: Self-pay | Admitting: Family Medicine

## 2016-05-16 ENCOUNTER — Other Ambulatory Visit: Payer: Self-pay | Admitting: Family Medicine

## 2016-05-16 VITALS — BP 128/74 | HR 81 | Temp 98.1°F | Resp 16 | Ht 61.0 in | Wt 164.2 lb

## 2016-05-16 DIAGNOSIS — E559 Vitamin D deficiency, unspecified: Secondary | ICD-10-CM

## 2016-05-16 DIAGNOSIS — F5104 Psychophysiologic insomnia: Secondary | ICD-10-CM | POA: Diagnosis not present

## 2016-05-16 DIAGNOSIS — Z9884 Bariatric surgery status: Secondary | ICD-10-CM

## 2016-05-16 DIAGNOSIS — K219 Gastro-esophageal reflux disease without esophagitis: Secondary | ICD-10-CM

## 2016-05-16 DIAGNOSIS — E119 Type 2 diabetes mellitus without complications: Secondary | ICD-10-CM | POA: Diagnosis not present

## 2016-05-16 DIAGNOSIS — J4530 Mild persistent asthma, uncomplicated: Secondary | ICD-10-CM

## 2016-05-16 DIAGNOSIS — G43109 Migraine with aura, not intractable, without status migrainosus: Secondary | ICD-10-CM

## 2016-05-16 LAB — POCT GLYCOSYLATED HEMOGLOBIN (HGB A1C): HEMOGLOBIN A1C: 5

## 2016-05-16 MED ORDER — LANSOPRAZOLE 30 MG PO CPDR
30.0000 mg | DELAYED_RELEASE_CAPSULE | Freq: Two times a day (BID) | ORAL | 1 refills | Status: DC
Start: 2016-05-16 — End: 2016-11-19

## 2016-05-16 MED ORDER — SEMAGLUTIDE (1 MG/DOSE) 2 MG/1.5ML ~~LOC~~ SOPN: 1.0000 mg | PEN_INJECTOR | SUBCUTANEOUS | 0 refills | Status: DC

## 2016-05-16 MED ORDER — MONTELUKAST SODIUM 10 MG PO TABS: ORAL_TABLET | ORAL | 1 refills | Status: DC

## 2016-05-16 MED ORDER — ZOLPIDEM TARTRATE 10 MG PO TABS: ORAL_TABLET | ORAL | 0 refills | Status: DC

## 2016-05-16 MED ORDER — VITAMIN D (ERGOCALCIFEROL) 1.25 MG (50000 UNIT) PO CAPS: ORAL_CAPSULE | ORAL | 0 refills | Status: DC

## 2016-05-16 NOTE — Progress Notes (Signed)
Name: Claire [name]   MRN: [id number]    DOB: 1970/07/17   Date:05/16/2016       Progress Note  Subjective  Chief Complaint  Chief Complaint  Patient presents with  . Medication Refill    3 month F/U  . Insomnia    Has been ok, sleeping on average 6-7 hours with medication  . Back Pain    Not much better still going to [facility] and was given Lyrica for management  . Asthma    Has been taking Singular for relief and has been whined due to not being as activate as she shoudl be   . Anxiety  . Diabetes    Needs a meter and strips, feels dizzy at times. Still losing weight- lost 5 more pounds since last visit.    HPI  Back pain: she states back pain got worse with tingling and numbness on both legs now, seen by [facility], and is seeing Dr. Dorcas Mcmurray, she had NCS/EMG by Dr. Domingo Cocking and it was normal, Dr. Dorcas Mcmurray did NCS/EMG on upper extremity and she has carpal tunnel bilaterally. No bowel or bladder incontinence. She is now on Lyrica and she is still titratting up, she has noticed mild improvement of tingling but pain on lower back is constant.   Depression/Anxiety: she stopped medication months ago, medications was making numb. She is feeling well, no crying spells through the holidays, doing better in respect of her patient. Doing well at work and normal focus. Last rx of Alprazolam was filled back 09/2015. She is currently taking medication prn when very stressed out. She has not taken Alprazolam in a long time now. She is trying to step away from the stress factor, or going for walks.   Insomnia: taking medication still wakes up at 2 to 3 am but able to fall back asleep. Still taking Ambien, unable to go down on dose of Ambien. Explained that Alprazolam and Ambien are not advised to be taken together.   Asthma Mild Persistent: she has noticed mild increase in SOB with activity with Spring, but states also not as active, taking Singulair and is using Ventolin before activity  only.   Migraine: she is still seeing Dr. Domingo Cocking - now off Topamax and is on Zonegran, and is doing well at this time, no pain at this time   DMII: diagnosed at age 89, took medication for a while, but stopped all medications 10/25/2010 after bariatric surgery, glucose has been controlled, no polyphagia, polyuria or polydipsia. Maximum weight of 265 lbs and is down to 164.2 lbs. She is still trying to lose more weight. Eye exam is up to date.   GERD: under control with medication   Patient Active Problem List   Diagnosis Date Noted  . Iron deficiency anemia 08/08/2015  . Lumbosacral pain 01/07/2015  . Muscle twitching 01/07/2015  . Intertrigo 11/05/2014  . Perennial allergic rhinitis 09/03/2014  . Lung nodules 09/03/2014  . Vitamin D deficiency 09/03/2014  . Diet-controlled diabetes mellitus ([place]) 09/03/2014  . History of kidney stones 09/03/2014  . Chronic insomnia 09/03/2014  . History of iron deficiency anemia 09/03/2014  . Depression with anxiety 09/03/2014  . Palpitations 06/22/2014  . History of bariatric surgery 12/03/2013  . Asthma, mild persistent 11/21/2012  . CN (constipation) 11/21/2012  . GERD without esophagitis 11/21/2012  . History of hyperlipidemia 11/21/2012  . Migraine with aura and without status migrainosus 11/21/2012  . History of sleep apnea 11/21/2012  . Morbid obesity ([place]) 11/21/2012  .  IBS (irritable bowel syndrome) 08/15/2012    Past Surgical History:  Procedure Laterality Date  . BLADDER SURGERY  2010  . BREAST BIOPSY Right 2014   stereotatic biopsy  . CHOLECYSTECTOMY  2010  . COLONOSCOPY  2014    Done at [facility]  . DILITATION & CURRETTAGE/HYSTROSCOPY WITH NOVASURE ABLATION N/A 06/16/2015   Procedure: DILATATION & CURETTAGE/HYSTEROSCOPY WITH NOVASURE ABLATION;  Surgeon: Brien Few, MD;  Location: [facility];  Service: Gynecology;  Laterality: N/A;  . [place] RESECTION  2012  . LAPAROSCOPY N/A 06/16/2015   Procedure:  LAPAROSCOPY DIAGNOSTIC;  Surgeon: Brien Few, MD;  Location: [facility];  Service: Gynecology;  Laterality: N/A;  . LYSIS OF ADHESION N/A 06/16/2015   Procedure: LYSIS OF ADHESION;  Surgeon: Brien Few, MD;  Location: [facility];  Service: Gynecology;  Laterality: N/A;  . PLANTAR FASCIA SURGERY Bilateral   . ROBOTIC ASSISTED SALPINGO OOPHERECTOMY Right 06/16/2015   Procedure: ROBOTIC ASSISTED SALPINGO OOPHORECTOMY, EXCISION OF RIGHT CUL DE [place] MASS;  Surgeon: Brien Few, MD;  Location: [facility];  Service: Gynecology;  Laterality: Right;  . TONSILLECTOMY    . UPPER GI ENDOSCOPY      Family History  Problem Relation Age of Onset  . Heart attack Mother   . Stroke Mother   . Multiple myeloma Mother   . Hyperlipidemia Father   . Heart attack Sister   . Heart attack Maternal Uncle   . Heart attack Paternal Grandfather   . Breast cancer Paternal Aunt   . Breast cancer Paternal Grandmother   . Lung cancer Maternal Grandfather     Social History   Social History  . Marital status: Married    Spouse name: N/A  . Number of children: N/A  . Years of education: N/A   Occupational History  . Not on file.   Social History Main Topics  . Smoking status: Former Smoker    Packs/day: 1.00    Years: 10.00    Types: Cigarettes    Quit date: 02/19/1993  . Smokeless tobacco: Never Used  . Alcohol use 0.0 oz/week     Comment: occasionally  . Drug use: No  . Sexual activity: Yes    Partners: Male   Other Topics Concern  . Not on file   Social History Narrative  . No narrative on file     Current Outpatient Prescriptions:  .  ALPRAZolam (XANAX) 0.5 MG tablet, Take 1 tablet (0.5 mg total) by mouth at bedtime as needed for anxiety., Disp: 90 tablet, Rfl: 0 .  Ascorbic Acid (VITAMIN C) 1000 MG tablet, Take 1,000 mg by mouth., Disp: , Rfl:  .  calcium carbonate (OS-CAL) 600 MG TABS tablet, Take 600 mg by mouth daily with breakfast., Disp: , Rfl:  .  Cholecalciferol (VITAMIN D) 1000 UNITS  capsule, Take 1,000 Units by mouth daily., Disp: , Rfl:  .  clobetasol ointment (TEMOVATE) 0.05 %, , Disp: , Rfl: 0 .  cyclobenzaprine (FLEXERIL) 10 MG tablet, Take 1 tablet (10 mg total) by mouth at bedtime. Prn, Disp: 90 tablet, Rfl: 1 .  ferrous sulfate 325 (65 FE) MG EC tablet, Take 325 mg by mouth 3 (three) times daily with meals., Disp: , Rfl:  .  fluconazole (DIFLUCAN) 150 MG tablet, Take 1 tablet (150 mg total) by mouth daily as needed., Disp: 9 tablet, Rfl: 1 .  lansoprazole (PREVACID) 30 MG capsule, Take 1 capsule (30 mg total) by mouth 2 (two) times daily before a meal., Disp: 180  capsule, Rfl: 1 .  loratadine (CLARITIN) 10 MG tablet, Take 10 mg by mouth daily., Disp: , Rfl:  .  LYRICA 50 MG capsule, Take 1 capsule by mouth daily., Disp: , Rfl:  .  mometasone (NASONEX) 50 MCG/ACT nasal spray, USE 2 SPRAYS IN EACH NOSTRIL AT BEDTIME, Disp: 51 g, Rfl: 1 .  montelukast (SINGULAIR) 10 MG tablet, TAKE 1 TABLET(10 MG) BY MOUTH AT BEDTIME, Disp: 90 tablet, Rfl: 1 .  Multiple Vitamin (MULTIVITAMIN) tablet, Take 1 tablet by mouth daily., Disp: , Rfl:  .  Potassium Aminobenzoate 500 MG CAPS, Take 1 capsule by mouth daily., Disp: , Rfl:  .  promethazine (PHENERGAN) 12.5 MG tablet, TAKE 1 TABLET BY MOUTH EVERY 6 HOURS AS NEEDED FOR MIGRAINES WITH NAUSEA, Disp: 20 tablet, Rfl: 2 .  SUMAtriptan (IMITREX) 100 MG tablet, TAKE 1 TABLET AS NEEDED FOR MIGRAINE EPISODE, NO MORE THAN 2 IN 24 HOURS, Disp: 18 tablet, Rfl: 1 .  VENTOLIN HFA 108 (90 Base) MCG/ACT inhaler, USE 2 INHALATIONS EVERY 6 HOURS AS NEEDED FOR WHEEZING OR SHORTNESS OF BREATH, Disp: 54 g, Rfl: 0 .  vitamin B-12 (CYANOCOBALAMIN) 100 MCG tablet, Take 100 mcg by mouth as directed. , Disp: , Rfl:  .  Vitamin D, Ergocalciferol, (DRISDOL) 50000 units CAPS capsule, TAKE 1 CAPSULE EVERY 7 DAYS, Disp: 12 capsule, Rfl: 0 .  zolpidem (AMBIEN) 10 MG tablet, Must be [employer], Disp: 90 tablet, Rfl: 0 .  zonisamide (ZONEGRAN) 50 MG capsule, Take 3  capsules by mouth daily., Disp: , Rfl: 2  Allergies  Allergen Reactions  . Linzess [Linaclotide]     Worsening of diarrhea  . Other Hives and Other (See Comments)    Ricotta Cheese: flushed and hot Ricotta Cheese: flushed and hot Ricotta Cheese: flushed and hot  . Cymbalta [Duloxetine Hcl] Palpitations    And photosensitivity  . Vicodin [Hydrocodone-Acetaminophen] Itching     ROS  Constitutional: Negative for fever, positive for  weight change.  Respiratory: Negative for cough and shortness of breath.   Cardiovascular: Negative for chest pain or palpitations.  Gastrointestinal: Negative for abdominal pain, no bowel changes.  Musculoskeletal: Negative for gait problem or joint swelling.  Skin: Negative for rash.  Neurological: Negative for dizziness, positive for intermittent  headache.  No other specific complaints in a complete review of systems (except as listed in HPI above).  Objective  Vitals:   05/16/16 0857  BP: 128/74  Pulse: 81  Resp: 16  Temp: 98.1 F (36.7 C)  TempSrc: Oral  SpO2: 97%  Weight: 164 lb 3.2 oz (74.5 kg)  Height: _0  (1.549 m)    Body mass index is 31.03 kg/m.  Physical Exam  Constitutional: Patient appears well-developed and well-nourished. Obese  No distress.  HEENT: head atraumatic, normocephalic, pupils equal and reactive to light,  neck supple, throat within normal limits Cardiovascular: Normal rate, regular rhythm and normal heart sounds.  No murmur heard. No BLE edema. Pulmonary/Chest: Effort normal and breath sounds normal. No respiratory distress. Abdominal: Soft.  There is no tenderness. Psychiatric: Patient has a normal mood and affect. behavior is normal. Judgment and thought content normal.  Recent Results (from the past 2160 hour(s))  HM DIABETES EYE EXAM     Status: None   Collection Time: 03/24/16 12:00 AM  Result Value Ref Range   HM Diabetic Eye Exam No Retinopathy No Retinopathy    Comment: Dr. Ocie Doyne  POCT  HgB A1C     Status: Normal  Collection Time: 05/16/16  9:16 AM  Result Value Ref Range   Hemoglobin A1C 5.0      PHQ2/9: Depression screen [facility] 2/9 05/16/2016 02/22/2016 11/11/2015 08/08/2015 05/02/2015  Decreased Interest 0 0 0 0 0  Down, Depressed, Hopeless 0 0 0 0 0  PHQ - 2 Score 0 0 0 0 0  Altered sleeping - - - - -  Tired, decreased energy - - - - -  Change in appetite - - - - -  Feeling bad or failure about yourself  - - - - -  Trouble concentrating - - - - -  Moving slowly or fidgety/restless - - - - -  Suicidal thoughts - - - - -  PHQ-9 Score - - - - -  Difficult doing work/chores - - - - -    Fall Risk: Fall Risk  05/16/2016 02/22/2016 11/11/2015 08/08/2015 05/02/2015  Falls in the past year? _0      Functional Status Survey: Is the patient deaf or have difficulty hearing?: No Does the patient have difficulty seeing, even when wearing glasses/contacts?: No Does the patient have difficulty concentrating, remembering, or making decisions?: No Does the patient have difficulty walking or climbing stairs?: No Does the patient have difficulty dressing or bathing?: No Does the patient have difficulty doing errands alone such as visiting a doctor's office or shopping?: No    Assessment & Plan  1. Diet-controlled diabetes mellitus ([place])  - POCT HgB A1C  She has taken Metformin, Byetta and even insulin prior to bariatric surgery , we will try Ozempic today even thought hgbA1C is at goal. Discussed possible side effects, no family history of thyroid cancer  - Semaglutide (OZEMPIC) 1 MG/DOSE SOPN; Inject 1 mg into the skin once a week.  Dispense: 9 mL; Refill: 0  2. Chronic insomnia  - zolpidem (AMBIEN) 10 MG tablet; Must be [employer]  Dispense: 90 tablet; Refill: 0  3. History of bariatric surgery  Lost 5 lbs since last visit  4. Vitamin D deficiency  - Vitamin D, Ergocalciferol, (DRISDOL) 50000 units CAPS capsule; TAKE 1 CAPSULE EVERY 7 DAYS  Dispense:  12 capsule; Refill: 0  5. Mild persistent asthma without complication  Symptoms are more often now, using Ventolin with activity only  - montelukast (SINGULAIR) 10 MG tablet; TAKE 1 TABLET(10 MG) BY MOUTH AT BEDTIME  Dispense: 90 tablet; Refill: 1  6. Migraine with aura and without status migrainosus, not intractable  Seeing Dr .Domingo Cocking   7. GERD without esophagitis  - lansoprazole (PREVACID) 30 MG capsule; Take 1 capsule (30 mg total) by mouth 2 (two) times daily before a meal.  Dispense: 180 capsule; Refill: 1

## 2016-05-16 NOTE — Telephone Encounter (Signed)
Patient called to let Dr. Ancil Boozer know her Insurance does cover Ozempic and will only be $25 dollars a month. Patient states Dr. Ancil Boozer was going to give her a sample if it is covered, so informed patient to come by and I will instruct her on how to use the medication.

## 2016-05-22 ENCOUNTER — Other Ambulatory Visit: Payer: Self-pay

## 2016-05-22 DIAGNOSIS — F5104 Psychophysiologic insomnia: Secondary | ICD-10-CM

## 2016-05-22 MED ORDER — ZOLPIDEM TARTRATE 10 MG PO TABS: ORAL_TABLET | ORAL | 0 refills | Status: DC

## 2016-05-22 NOTE — Progress Notes (Signed)
Pt called and stated that pharmacy would not fill her last rx without directions of use printed on the rx so I reprinted a new script to be signed. Pt stated that she had enough to last until [facility] return.

## 2016-05-31 ENCOUNTER — Telehealth: Payer: Self-pay | Admitting: Family Medicine

## 2016-05-31 NOTE — Telephone Encounter (Signed)
Requesting return call pertaining to zolpidem. [phone]

## 2016-05-31 NOTE — Telephone Encounter (Signed)
Patient called and states the [facility] [address] did not [facility] her [employer] Brand. She called the pharmacy and they stated they would give her the prescription back if she brought all the pills with the receipt. Patient states she would do that and then fax [facility] to the [facility].

## 2016-06-18 ENCOUNTER — Telehealth: Payer: Self-pay

## 2016-06-18 ENCOUNTER — Ambulatory Visit (INDEPENDENT_AMBULATORY_CARE_PROVIDER_SITE_OTHER): Payer: BLUE CROSS/BLUE SHIELD | Admitting: Family Medicine

## 2016-06-18 ENCOUNTER — Encounter: Payer: Self-pay | Admitting: Family Medicine

## 2016-06-18 VITALS — BP 112/72 | HR 83 | Temp 98.2°F | Resp 14 | Wt 157.7 lb

## 2016-06-18 DIAGNOSIS — L2389 Allergic contact dermatitis due to other agents: Secondary | ICD-10-CM | POA: Diagnosis not present

## 2016-06-18 MED ORDER — PREDNISONE 10 MG PO TABS: 10.0000 mg | ORAL_TABLET | Freq: Every day | ORAL | 0 refills | Status: DC

## 2016-06-18 MED ORDER — HYDROXYZINE HCL 25 MG PO TABS
25.0000 mg | ORAL_TABLET | Freq: Three times a day (TID) | ORAL | 0 refills | Status: DC | PRN
Start: 2016-06-18 — End: 2016-08-17

## 2016-06-18 NOTE — Patient Instructions (Addendum)
Colloidal Oatmeal baths - use luke-warm water not hot water. Stop taking Benadryl. Cut Ambien in half while taking Hydroxyzine Do not apply OTC hydrocortisone cream to face. Contact Dermatitis Dermatitis is redness, soreness, and swelling (inflammation) of the skin. Contact dermatitis is a reaction to certain substances that touch the skin. You either touched something that irritated your skin, or you have allergies to something you touched. Follow these instructions at home: [place] your skin as needed.  Apply cool compresses to the affected areas.  Try taking a bath with:  Epsom salts. Follow the instructions on the package. You can get these at a pharmacy or grocery store.  Baking soda. Pour a small amount into the bath as told by your doctor.  Colloidal oatmeal. Follow the instructions on the package. You can get this at a pharmacy or grocery store.  Try applying baking soda paste to your skin. Stir water into baking soda until it looks like paste.  Do not scratch your skin.  Bathe less often.  Bathe in lukewarm water. Avoid using hot water. Medicines   Take or apply over-the-counter and prescription medicines only as told by your doctor.  If you were prescribed an antibiotic medicine, take or apply your antibiotic as told by your doctor. Do not stop taking the antibiotic even if your condition starts to get better. General instructions   Keep all follow-up visits as told by your doctor. This is important.  Avoid the substance that caused your reaction. If you do not know what caused it, keep a journal to try to track what caused it. Write down:  What you eat.  What cosmetic products you use.  What you drink.  What you wear in the affected area. This includes jewelry.  If you were given a bandage (dressing), take care of it as told by your doctor. This includes when to change and remove it. Contact a doctor if:  You do not get better with  treatment.  Your condition gets worse.  You have signs of infection such as:  Swelling.  Tenderness.  Redness.  Soreness.  Warmth.  You have a fever.  You have new symptoms. Get help right away if:  You have a very bad headache.  You have neck pain.  Your neck is stiff.  You throw up (vomit).  You feel very sleepy.  You see red streaks coming from the affected area.  Your bone or joint underneath the affected area becomes painful after the skin has healed.  The affected area turns darker.  You have trouble breathing. This information is not intended to replace advice given to you by your health care provider. Make sure you discuss any questions you have with your health care provider. Document Released: 12/03/2008 Document Revised: 07/14/2015 Document Reviewed: 06/23/2014 Elsevier Interactive Patient Education  2017 [employer].

## 2016-06-18 NOTE — Telephone Encounter (Signed)
Pt is coming in to see Raquel Sarna

## 2016-06-18 NOTE — Telephone Encounter (Signed)
Pt called and stated that she was at the beach this weekend and broke out in rash due to either the sun or the sunscreen that she was using. Pt stated that she has taken benadryl but it is not helping she would like to know what she should do. She stated that this has happened before about 2 years ago

## 2016-06-18 NOTE — Progress Notes (Addendum)
Name: Claire [name]   MRN: [id number]    DOB: [birth year]   Date:06/18/2016       Progress Note  Subjective  Chief Complaint  Chief Complaint  Patient presents with  . Rash    Rash all over face, legs and arm on tuesday.   Once it gets hot pt starts icthing and bumps. Peeling on face    HPI  Pt presents with 7 day history of rash to face, arms, and legs after applying a new sunscreen. No rash to abdomen/back/buttocks/pelvic area.  She endorses significant itching, dry and flaking skin. She has been taking Benadryl around the clock, applying moisturizer on face and hydrocortisone cream to body PRN without relief.  Denies fevers/chills, NVD, shortness of breath, chest pain.  Patient Active Problem List   Diagnosis Date Noted  . Iron deficiency anemia 08/08/2015  . Lumbosacral pain 01/07/2015  . Muscle twitching 01/07/2015  . Intertrigo 11/05/2014  . Perennial allergic rhinitis 09/03/2014  . Lung nodules 09/03/2014  . Vitamin D deficiency 09/03/2014  . Diet-controlled diabetes mellitus ([place]) 09/03/2014  . History of kidney stones 09/03/2014  . Chronic insomnia 09/03/2014  . History of iron deficiency anemia 09/03/2014  . Depression with anxiety 09/03/2014  . Palpitations 06/22/2014  . History of bariatric surgery 12/03/2013  . Asthma, mild persistent 11/21/2012  . CN (constipation) 11/21/2012  . GERD without esophagitis 11/21/2012  . History of hyperlipidemia 11/21/2012  . Migraine with aura and without status migrainosus 11/21/2012  . History of sleep apnea 11/21/2012  . Morbid obesity ([place]) 11/21/2012  . IBS (irritable bowel syndrome) 08/15/2012    Social History  Substance Use Topics  . Smoking status: Former Smoker    Packs/day: 1.00    Years: 10.00    Types: Cigarettes    Quit date: 02/19/1993  . Smokeless tobacco: Never Used  . Alcohol use 0.0 oz/week     Comment: occasionally     Current Outpatient Prescriptions:  .  ALPRAZolam (XANAX) 0.5 MG tablet, Take  1 tablet (0.5 mg total) by mouth at bedtime as needed for anxiety., Disp: 90 tablet, Rfl: 0 .  Ascorbic Acid (VITAMIN C) 1000 MG tablet, Take 1,000 mg by mouth., Disp: , Rfl:  .  calcium carbonate (OS-CAL) 600 MG TABS tablet, Take 600 mg by mouth daily with breakfast., Disp: , Rfl:  .  Cholecalciferol (VITAMIN D) 1000 UNITS capsule, Take 1,000 Units by mouth daily., Disp: , Rfl:  .  clobetasol ointment (TEMOVATE) 0.05 %, , Disp: , Rfl: 0 .  cyclobenzaprine (FLEXERIL) 10 MG tablet, Take 1 tablet (10 mg total) by mouth at bedtime. Prn, Disp: 90 tablet, Rfl: 1 .  ferrous sulfate 325 (65 FE) MG EC tablet, Take 325 mg by mouth 3 (three) times daily with meals., Disp: , Rfl:  .  fluconazole (DIFLUCAN) 150 MG tablet, Take 1 tablet (150 mg total) by mouth daily as needed., Disp: 9 tablet, Rfl: 1 .  lansoprazole (PREVACID) 30 MG capsule, Take 1 capsule (30 mg total) by mouth 2 (two) times daily before a meal., Disp: 180 capsule, Rfl: 1 .  loratadine (CLARITIN) 10 MG tablet, Take 10 mg by mouth daily., Disp: , Rfl:  .  LYRICA 50 MG capsule, Take 1 capsule by mouth daily., Disp: , Rfl:  .  mometasone (NASONEX) 50 MCG/ACT nasal spray, USE 2 SPRAYS IN EACH NOSTRIL AT BEDTIME, Disp: 51 g, Rfl: 1 .  montelukast (SINGULAIR) 10 MG tablet, TAKE 1 TABLET(10 MG) BY MOUTH AT BEDTIME,  Disp: 90 tablet, Rfl: 1 .  Multiple Vitamin (MULTIVITAMIN) tablet, Take 1 tablet by mouth daily., Disp: , Rfl:  .  Potassium Aminobenzoate 500 MG CAPS, Take 1 capsule by mouth daily., Disp: , Rfl:  .  promethazine (PHENERGAN) 12.5 MG tablet, TAKE 1 TABLET BY MOUTH EVERY 6 HOURS AS NEEDED FOR MIGRAINES WITH NAUSEA, Disp: 20 tablet, Rfl: 2 .  Semaglutide (OZEMPIC) 1 MG/DOSE SOPN, Inject 1 mg into the skin once a week., Disp: 9 mL, Rfl: 0 .  SUMAtriptan (IMITREX) 100 MG tablet, TAKE 1 TABLET AS NEEDED FOR MIGRAINE EPISODE, NO MORE THAN 2 IN 24 HOURS, Disp: 18 tablet, Rfl: 1 .  VENTOLIN HFA 108 (90 Base) MCG/ACT inhaler, USE 2 INHALATIONS  EVERY 6 HOURS AS NEEDED FOR WHEEZING OR SHORTNESS OF BREATH, Disp: 54 g, Rfl: 0 .  vitamin B-12 (CYANOCOBALAMIN) 100 MCG tablet, Take 100 mcg by mouth as directed. , Disp: , Rfl:  .  Vitamin D, Ergocalciferol, (DRISDOL) 50000 units CAPS capsule, TAKE 1 CAPSULE EVERY 7 DAYS, Disp: 12 capsule, Rfl: 0 .  zolpidem (AMBIEN) 10 MG tablet, Must be [employer], Disp: 90 tablet, Rfl: 0 .  zonisamide (ZONEGRAN) 50 MG capsule, Take 3 capsules by mouth daily., Disp: , Rfl: 2 .  hydrOXYzine (ATARAX/VISTARIL) 25 MG tablet, Take 1 tablet (25 mg total) by mouth 3 (three) times daily as needed., Disp: 30 tablet, Rfl: 0 .  predniSONE (DELTASONE) 10 MG tablet, Take 1 tablet (10 mg total) by mouth daily with breakfast. Take 6 pills day 1, 5 pills day 2, 4 pills day 3, 3 pills day 4, 2 pills day 5, 1 pills day 6., Disp: 21 tablet, Rfl: 0  Allergies  Allergen Reactions  . Gabapentin     Bladder incontinence at higher dose  . Linzess [Linaclotide]     Worsening of diarrhea  . Other Hives and Other (See Comments)    Ricotta Cheese: flushed and hot Ricotta Cheese: flushed and hot Ricotta Cheese: flushed and hot  . Cymbalta [Duloxetine Hcl] Palpitations    And photosensitivity  . Vicodin [Hydrocodone-Acetaminophen] Itching    ROS   Constitutional: Negative for fever or weight change.  Respiratory: Negative for cough and shortness of breath.   Cardiovascular: Negative for chest pain or palpitations.  Gastrointestinal: Negative for abdominal pain, no bowel changes.  Musculoskeletal: Negative for gait problem or joint swelling.  Skin: Positive for rash per HPI.  Neurological: Negative for dizziness or headache.  No other specific complaints in a complete review of systems (except as listed in HPI above).  Objective  Vitals:   06/18/16 1510  BP: 112/72  Pulse: 83  Resp: 14  Temp: 98.2 F (36.8 C)  TempSrc: Oral  SpO2: 98%  Weight: 157 lb 11.2 oz (71.5 kg)    Body mass index is 29.8  kg/m.  Nursing Note and Vital Signs reviewed.  Physical Exam  Constitutional: Patient appears well-developed and well-nourished. Overweight. No distress.  HEENT: head atraumatic, normocephalic Cardiovascular: Normal rate, regular rhythm, S1/S2 present.  No murmur or rub heard. No BLE edema. Pulmonary/Chest: Effort normal and breath sounds clear. No respiratory distress or retractions. Abdominal: Soft and non-tender, bowel sounds present x4 quadrants. Skin: Confluent macular erythematic rash to face, behind bilateral ears without ocular or eyelid involvement, on BUE and BLE; excoriation noted to LUE, flaking skin on face. No bleeding or exudate. Psychiatric: Patient has a normal mood and affect. behavior is normal. Judgment and thought content normal.  Recent Results (from the  past 2160 hour(s))  HM DIABETES EYE EXAM     Status: None   Collection Time: 03/24/16 12:00 AM  Result Value Ref Range   HM Diabetic Eye Exam No Retinopathy No Retinopathy    Comment: Dr. Ocie Doyne  POCT HgB A1C     Status: Normal   Collection Time: 05/16/16  9:16 AM  Result Value Ref Range   Hemoglobin A1C 5.0      Assessment & Plan  1. Allergic contact dermatitis due to other agents - predniSONE (DELTASONE) 10 MG tablet; Take 1 tablet (10 mg total) by mouth daily with breakfast. Take 6 pills day 1, 5 pills day 2, 4 pills day 3, 3 pills day 4, 2 pills day 5, 1 pills day 6.  Dispense: 21 tablet; Refill: 0 - hydrOXYzine (ATARAX/VISTARIL) 25 MG tablet; Take 1 tablet (25 mg total) by mouth 3 (three) times daily as needed.  Dispense: 30 tablet; Refill: 0 -Advised patient to take 5mg  Ambien instead of 10mg  while taking hydroxyzine. Pt to stop Benadryl. -Recommended tepid colloidal oatmeal baths -Advised patient to follow up in 5-10 days if no improvement and sooner if worsening. -Discontinue use of new sunscreen.  -Red flags and when to present for emergency care or RTC including fever >101.70F, chest pain,  shortness of breath, new/worsening/un-resolving symptoms, body aches, NVD reviewed with patient at time of visit. Follow up and care instructions discussed and provided in AVS.  I have reviewed this encounter including the documentation in this note and/or discussed this patient with the Johney Maine, FNP, NP-C. I am certifying that I agree with the content of this note as supervising physician.  Steele Sizer, MD [facility] 06/19/2016, 8:00 AM

## 2016-06-18 NOTE — Telephone Encounter (Signed)
Can she come in today to see Raelyn Ensign?

## 2016-07-07 ENCOUNTER — Other Ambulatory Visit: Payer: Self-pay | Admitting: Family Medicine

## 2016-07-25 LAB — HM COLONOSCOPY

## 2016-08-02 ENCOUNTER — Encounter: Payer: Self-pay | Admitting: Family Medicine

## 2016-08-07 ENCOUNTER — Other Ambulatory Visit: Payer: Self-pay | Admitting: Family Medicine

## 2016-08-07 DIAGNOSIS — E559 Vitamin D deficiency, unspecified: Secondary | ICD-10-CM

## 2016-08-07 NOTE — Telephone Encounter (Signed)
Patient requesting refill of Vitamin [facility] to [facility]. 

## 2016-08-15 ENCOUNTER — Other Ambulatory Visit: Payer: Self-pay | Admitting: Family Medicine

## 2016-08-15 DIAGNOSIS — E119 Type 2 diabetes mellitus without complications: Secondary | ICD-10-CM

## 2016-08-15 NOTE — Telephone Encounter (Signed)
Patient requesting refill of [facility]empic to [facility].

## 2016-08-17 ENCOUNTER — Ambulatory Visit (INDEPENDENT_AMBULATORY_CARE_PROVIDER_SITE_OTHER): Payer: BLUE CROSS/BLUE SHIELD | Admitting: Family Medicine

## 2016-08-17 ENCOUNTER — Encounter: Payer: Self-pay | Admitting: Family Medicine

## 2016-08-17 VITALS — BP 116/84 | HR 82 | Temp 97.8°F | Resp 16 | Ht 61.0 in | Wt 146.1 lb

## 2016-08-17 DIAGNOSIS — Z9884 Bariatric surgery status: Secondary | ICD-10-CM

## 2016-08-17 DIAGNOSIS — E119 Type 2 diabetes mellitus without complications: Secondary | ICD-10-CM | POA: Diagnosis not present

## 2016-08-17 DIAGNOSIS — Z13220 Encounter for screening for lipoid disorders: Secondary | ICD-10-CM

## 2016-08-17 DIAGNOSIS — K5730 Diverticulosis of large intestine without perforation or abscess without bleeding: Secondary | ICD-10-CM | POA: Insufficient documentation

## 2016-08-17 DIAGNOSIS — F418 Other specified anxiety disorders: Secondary | ICD-10-CM

## 2016-08-17 DIAGNOSIS — F5104 Psychophysiologic insomnia: Secondary | ICD-10-CM | POA: Diagnosis not present

## 2016-08-17 DIAGNOSIS — R634 Abnormal weight loss: Secondary | ICD-10-CM

## 2016-08-17 DIAGNOSIS — K449 Diaphragmatic hernia without obstruction or gangrene: Secondary | ICD-10-CM | POA: Diagnosis not present

## 2016-08-17 DIAGNOSIS — J4530 Mild persistent asthma, uncomplicated: Secondary | ICD-10-CM | POA: Diagnosis not present

## 2016-08-17 DIAGNOSIS — G43109 Migraine with aura, not intractable, without status migrainosus: Secondary | ICD-10-CM

## 2016-08-17 MED ORDER — PROMETHAZINE HCL 12.5 MG PO TABS: ORAL_TABLET | ORAL | 0 refills | Status: DC

## 2016-08-17 MED ORDER — ZOLPIDEM TARTRATE 10 MG PO TABS: ORAL_TABLET | ORAL | 0 refills | Status: DC

## 2016-08-17 MED ORDER — ALPRAZOLAM 0.5 MG PO TABS: 0.5000 mg | ORAL_TABLET | Freq: Every evening | ORAL | 0 refills | Status: DC | PRN

## 2016-08-17 NOTE — Progress Notes (Signed)
Name: Claire [name]   MRN: [id number]    DOB: [birth year]   Date:08/17/2016       Progress Note  Subjective  Chief Complaint  Chief Complaint  Patient presents with  . Medication Refill    3 month F/U  . Diet-controlled diabetes mellitus  . Insomnia    Unchanged  . Asthma    Well controlled  . Gastroesophageal Reflux    Seen GI at [facility] for EGD and Colonoscopy. Found Hiatel Hernia and inflammation in intestines.  . Back Pain  . Anxiety    HPI   Depression/Anxiety: she stopped medication months ago - sSRI, medications was making numb. She is feeling well,still has stress but able to cope with better, still has some old Alprazolam , we will give her only 51 since old rx expired, to take prn only  Insomnia: taking medication still wakes up at 2 to 3 am but able to fall back asleep. Still taking Ambien, unable to go down on dose of Ambien. Explained that Alprazolam and Ambien are not advised to be taken together.   Asthma Mild Persistent: she is doing well now, no cough, wheezing or SOB, still taking Singulair daily, Ventolin not recently.   Migraine: she is still seeing Dr. Domingo Cocking - now off Topamax and is on Zonegran, and is doing well at this time, no pain at this time  DMII: diagnosed at age 70, took medication for a while, but stopped all medications 10/25/2010 after bariatric surgery, glucose has been controlled, no polyphagia, polyuria or polydipsia. Maximum weight of 265 lbs and is down to 164.2 lbs. She wanted to lose more weight so we started Ozempic back in March 2018, she has lost 18 lbs since, part of it was from recent episode of gastritis, she is happy with medication and does not want to change at this time. Eye exam is up to date.   GERD: improving again, treated for gastritis, lost weight, but back to normal now, she also has hiatal hernia, based on recent EGD  Back pain: she states back pain got worse with tingling and numbness on both legs now, seen  by [facility], and is seeing Dr. Dorcas Mcmurray, she had NCS/EMG by Dr. Domingo Cocking and it was normal, Dr. Dorcas Mcmurray did NCS/EMG on upper extremity and she has carpal tunnel bilaterally. No bowel or bladder incontinence. She is now on Lyrica ,still has some pain, but doing well  Patient Active Problem List   Diagnosis Date Noted  . Esophageal hiatal hernia 08/17/2016  . Diverticulosis of colon 08/17/2016  . Iron deficiency anemia 08/08/2015  . Lumbosacral pain 01/07/2015  . Muscle twitching 01/07/2015  . Intertrigo 11/05/2014  . Perennial allergic rhinitis 09/03/2014  . Lung nodules 09/03/2014  . Vitamin D deficiency 09/03/2014  . Diet-controlled diabetes mellitus ([place]) 09/03/2014  . History of kidney stones 09/03/2014  . Chronic insomnia 09/03/2014  . History of iron deficiency anemia 09/03/2014  . Depression with anxiety 09/03/2014  . Palpitations 06/22/2014  . History of bariatric surgery 12/03/2013  . Asthma, mild persistent 11/21/2012  . CN (constipation) 11/21/2012  . GERD without esophagitis 11/21/2012  . History of hyperlipidemia 11/21/2012  . Migraine with aura and without status migrainosus 11/21/2012  . History of sleep apnea 11/21/2012  . Morbid obesity ([place]) 11/21/2012  . IBS (irritable bowel syndrome) 08/15/2012    Past Surgical History:  Procedure Laterality Date  . BLADDER SURGERY  2010  . BREAST BIOPSY Right 2014   stereotatic biopsy  .  CHOLECYSTECTOMY  2010  . COLONOSCOPY  2014    [facility] at [facility]  . DILITATION & CURRETTAGE/HYSTROSCOPY WITH NOVASURE ABLATION N/A 06/16/2015   Procedure: DILATATION & CURETTAGE/HYSTEROSCOPY WITH NOVASURE ABLATION;  Surgeon: Brien Few, MD;  Location: [facility];  Service: Gynecology;  Laterality: N/A;  . [place] RESECTION  2012  . LAPAROSCOPY N/A 06/16/2015   Procedure: LAPAROSCOPY DIAGNOSTIC;  Surgeon: Brien Few, MD;  Location: [facility];  Service: Gynecology;  Laterality: N/A;  . LYSIS OF ADHESION N/A 06/16/2015    Procedure: LYSIS OF ADHESION;  Surgeon: Brien Few, MD;  Location: [facility];  Service: Gynecology;  Laterality: N/A;  . PLANTAR FASCIA SURGERY Bilateral   . ROBOTIC ASSISTED SALPINGO OOPHERECTOMY Right 06/16/2015   Procedure: ROBOTIC ASSISTED SALPINGO OOPHORECTOMY, EXCISION OF RIGHT CUL DE [place] MASS;  Surgeon: Brien Few, MD;  Location: [facility];  Service: Gynecology;  Laterality: Right;  . TONSILLECTOMY    . UPPER GI ENDOSCOPY      Family History  Problem Relation Age of Onset  . Heart attack Mother   . Stroke Mother   . Multiple myeloma Mother   . Hyperlipidemia Father   . Heart attack Sister   . Heart attack Maternal Uncle   . Heart attack Paternal Grandfather   . Breast cancer Paternal Aunt   . Breast cancer Paternal Grandmother   . Lung cancer Maternal Grandfather     Social History   Social History  . Marital status: Married    Spouse name: N/A  . Number of children: N/A  . Years of education: N/A   Occupational History  . Not on file.   Social History Main Topics  . Smoking status: Former Smoker    Packs/day: 1.00    Years: 10.00    Types: Cigarettes    Quit date: 02/19/1993  . Smokeless tobacco: Never Used  . Alcohol use 0.0 oz/week     Comment: occasionally  . Drug use: No  . Sexual activity: Yes    Partners: Male   Other Topics Concern  . Not on file   Social History Narrative  . No narrative on file     Current Outpatient Prescriptions:  .  ALPRAZolam (XANAX) 0.5 MG tablet, Take 1 tablet (0.5 mg total) by mouth at bedtime as needed for anxiety., Disp: 90 tablet, Rfl: 0 .  Ascorbic Acid (VITAMIN C) 1000 MG tablet, Take 1,000 mg by mouth., Disp: , Rfl:  .  calcium carbonate (OS-CAL) 600 MG TABS tablet, Take 600 mg by mouth daily with breakfast., Disp: , Rfl:  .  clobetasol ointment (TEMOVATE) 0.05 %, , Disp: , Rfl: 0 .  cyclobenzaprine (FLEXERIL) 10 MG tablet, Take 1 tablet (10 mg total) by mouth at bedtime. Prn, Disp: 90 tablet, Rfl: 1 .  ferrous  sulfate 325 (65 FE) MG EC tablet, Take 325 mg by mouth daily with breakfast. , Disp: , Rfl:  .  fluconazole (DIFLUCAN) 150 MG tablet, Take 1 tablet (150 mg total) by mouth daily as needed., Disp: 9 tablet, Rfl: 1 .  lansoprazole (PREVACID) 30 MG capsule, Take 1 capsule (30 mg total) by mouth 2 (two) times daily before a meal., Disp: 180 capsule, Rfl: 1 .  loratadine (CLARITIN) 10 MG tablet, Take 10 mg by mouth daily., Disp: , Rfl:  .  LYRICA 50 MG capsule, Take 1 capsule by mouth daily., Disp: , Rfl:  .  mometasone (NASONEX) 50 MCG/ACT nasal spray, USE 2 SPRAYS IN EACH NOSTRIL AT BEDTIME, Disp:  51 g, Rfl: 1 .  montelukast (SINGULAIR) 10 MG tablet, TAKE 1 TABLET(10 MG) BY MOUTH AT BEDTIME, Disp: 90 tablet, Rfl: 1 .  Potassium Aminobenzoate 500 MG CAPS, Take 1 capsule by mouth daily., Disp: , Rfl:  .  promethazine (PHENERGAN) 12.5 MG tablet, TAKE 1 TABLET BY MOUTH EVERY 6 HOURS AS NEEDED FOR MIGRAINES WITH NAUSEA, Disp: 20 tablet, Rfl: 0 .  Semaglutide (OZEMPIC) 1 MG/DOSE SOPN, Inject 1 mg into the skin once a week., Disp: 9 mL, Rfl: 0 .  SUMAtriptan (IMITREX) 100 MG tablet, TAKE 1 TABLET AS NEEDED FOR MIGRAINE EPISODE, NO MORE THAN 2 IN 24 HOURS, Disp: 18 tablet, Rfl: 1 .  VENTOLIN HFA 108 (90 Base) MCG/ACT inhaler, USE 2 INHALATIONS EVERY 6 HOURS AS NEEDED FOR WHEEZING OR SHORTNESS OF BREATH, Disp: 54 g, Rfl: 0 .  vitamin B-12 (CYANOCOBALAMIN) 100 MCG tablet, Take 100 mcg by mouth as directed. , Disp: , Rfl:  .  Vitamin D, Ergocalciferol, (DRISDOL) 50000 units CAPS capsule, TAKE 1 CAPSULE EVERY 7 DAYS, Disp: 12 capsule, Rfl: 0 .  zolpidem (AMBIEN) 10 MG tablet, Must be [employer], Disp: 90 tablet, Rfl: 0 .  zonisamide (ZONEGRAN) 50 MG capsule, Take 3 capsules by mouth daily., Disp: , Rfl: 2  Allergies  Allergen Reactions  . Gabapentin     Bladder incontinence at higher dose  . Linzess [Linaclotide]     Worsening of diarrhea  . Other Hives and Other (See Comments)    Ricotta Cheese:  flushed and hot Ricotta Cheese: flushed and hot Ricotta Cheese: flushed and hot  . Cymbalta [Duloxetine Hcl] Palpitations    And photosensitivity  . Vicodin [Hydrocodone-Acetaminophen] Itching     ROS  Constitutional: Negative for fever , positive for weight change.  Respiratory: Negative for cough and shortness of breath.   Cardiovascular: Negative for chest pain or palpitations.  Gastrointestinal: Negative for abdominal pain, no bowel changes.  Musculoskeletal: Negative for gait problem or joint swelling.  Skin: Negative for rash.  Neurological: Negative for dizziness or headache.  No other specific complaints in a complete review of systems (except as listed in HPI above).  Objective  Vitals:   08/17/16 1040  BP: 116/84  Pulse: 82  Resp: 16  Temp: 97.8 F (36.6 C)  TempSrc: Oral  SpO2: 98%  Weight: 146 lb 1.6 oz (66.3 kg)  Height: _0  (1.549 m)    Body mass index is 27.61 kg/m.  Physical Exam  Constitutional: Patient appears well-developed and well-nourished. Obese  No distress.  HEENT: head atraumatic, normocephalic, pupils equal and reactive to light,  neck supple, throat within normal limits Cardiovascular: Normal rate, regular rhythm and normal heart sounds.  No murmur heard. No BLE edema. Pulmonary/Chest: Effort normal and breath sounds normal. No respiratory distress. Abdominal: Soft.  There is no tenderness. Psychiatric: Patient has a normal mood and affect. behavior is normal. Judgment and thought content normal.  Recent Results (from the past 2160 hour(s))  HM COLONOSCOPY     Status: None   Collection Time: 07/25/16 12:00 AM  Result Value Ref Range   HM Colonoscopy See Report (in chart) See Report (in chart), Patient Reported    Comment: Dr. Earnie Larsson, No diagnostic abnormalities  HM COLONOSCOPY     Status: None   Collection Time: 07/25/16 12:00 AM  Result Value Ref Range   HM Colonoscopy See Report (in chart) See Report (in chart), Patient  Reported    Comment: Dr. Erlene Quan, [facility] at [facility]  PHQ2/9: Depression screen [facility] 2/9 06/18/2016 05/16/2016 02/22/2016 11/11/2015 08/08/2015  Decreased Interest 0 0 0 0 0  Down, Depressed, Hopeless 0 0 0 0 0  PHQ - 2 Score 0 0 0 0 0  Altered sleeping - - - - -  Tired, decreased energy - - - - -  Change in appetite - - - - -  Feeling bad or failure about yourself  - - - - -  Trouble concentrating - - - - -  Moving slowly or fidgety/restless - - - - -  Suicidal thoughts - - - - -  PHQ-9 Score - - - - -  Difficult doing work/chores - - - - -     Fall Risk: Fall Risk  06/18/2016 05/16/2016 02/22/2016 11/11/2015 08/08/2015  Falls in the past year? _0      Assessment & Plan  1. Diet-controlled diabetes mellitus ([facility])  - COMPLETE METABOLIC PANEL WITH GFR - Hemoglobin A1c  2. Esophageal hiatal hernia  Recent EGD showed some gastritis  3. Diverticulosis of colon  Discussed symptoms of diverticulitis  4. Chronic insomnia  - zolpidem (AMBIEN) 10 MG tablet; Must be [employer]  Dispense: 90 tablet; Refill: 0  5. History of bariatric surgery  - Vitamin B12 - VITAMIN D 25 Hydroxy (Vit-D Deficiency, Fractures)  6. Mild persistent asthma without complication  Doing well at this time  7. Weight loss  Likely from recent gastritis and Ozempic, we will check labs - CBC with Differential/Platelet - TSH  8. Lipid screening  - Lipid panel  9. Depression with anxiety  - ALPRAZolam (XANAX) 0.5 MG tablet; Take 1 tablet (0.5 mg total) by mouth at bedtime as needed for anxiety.  Dispense: 15 tablet; Refill: 0  10. Migraine with aura and without status migrainosus, not intractable   - promethazine (PHENERGAN) 12.5 MG tablet; TAKE 1 TABLET BY MOUTH EVERY 6 HOURS AS NEEDED FOR MIGRAINES WITH NAUSEA  Dispense: 20 tablet; Refill: 0

## 2016-09-22 ENCOUNTER — Other Ambulatory Visit: Payer: Self-pay | Admitting: Family Medicine

## 2016-09-22 DIAGNOSIS — E119 Type 2 diabetes mellitus without complications: Secondary | ICD-10-CM

## 2016-11-04 ENCOUNTER — Other Ambulatory Visit: Payer: Self-pay | Admitting: Family Medicine

## 2016-11-04 DIAGNOSIS — E559 Vitamin D deficiency, unspecified: Secondary | ICD-10-CM

## 2016-11-05 NOTE — Telephone Encounter (Signed)
Patient requesting refill of Vitamin [facility] to [facility]. 

## 2016-11-16 ENCOUNTER — Other Ambulatory Visit: Payer: Self-pay | Admitting: Family Medicine

## 2016-11-16 DIAGNOSIS — J4530 Mild persistent asthma, uncomplicated: Secondary | ICD-10-CM

## 2016-11-16 NOTE — Telephone Encounter (Signed)
[facility]  

## 2016-11-19 ENCOUNTER — Encounter: Payer: Self-pay | Admitting: Family Medicine

## 2016-11-19 ENCOUNTER — Ambulatory Visit (INDEPENDENT_AMBULATORY_CARE_PROVIDER_SITE_OTHER): Payer: BLUE CROSS/BLUE SHIELD | Admitting: Family Medicine

## 2016-11-19 VITALS — BP 110/80 | HR 77 | Temp 97.4°F | Resp 14 | Wt 137.6 lb

## 2016-11-19 DIAGNOSIS — H8190 Unspecified disorder of vestibular function, unspecified ear: Secondary | ICD-10-CM

## 2016-11-19 DIAGNOSIS — Z9884 Bariatric surgery status: Secondary | ICD-10-CM

## 2016-11-19 DIAGNOSIS — G43109 Migraine with aura, not intractable, without status migrainosus: Secondary | ICD-10-CM

## 2016-11-19 DIAGNOSIS — K219 Gastro-esophageal reflux disease without esophagitis: Secondary | ICD-10-CM | POA: Diagnosis not present

## 2016-11-19 DIAGNOSIS — E119 Type 2 diabetes mellitus without complications: Secondary | ICD-10-CM | POA: Diagnosis not present

## 2016-11-19 DIAGNOSIS — R42 Dizziness and giddiness: Secondary | ICD-10-CM

## 2016-11-19 DIAGNOSIS — J4530 Mild persistent asthma, uncomplicated: Secondary | ICD-10-CM

## 2016-11-19 DIAGNOSIS — Z23 Encounter for immunization: Secondary | ICD-10-CM

## 2016-11-19 DIAGNOSIS — M461 Sacroiliitis, not elsewhere classified: Secondary | ICD-10-CM | POA: Diagnosis not present

## 2016-11-19 DIAGNOSIS — H9313 Tinnitus, bilateral: Secondary | ICD-10-CM

## 2016-11-19 DIAGNOSIS — H5509 Other forms of nystagmus: Secondary | ICD-10-CM

## 2016-11-19 DIAGNOSIS — F5104 Psychophysiologic insomnia: Secondary | ICD-10-CM

## 2016-11-19 LAB — POCT UA - MICROALBUMIN
ALBUMIN/CREATININE RATIO, URINE, POC: NEGATIVE
Creatinine, POC: NEGATIVE mg/dL
Microalbumin Ur, POC: NEGATIVE mg/L

## 2016-11-19 LAB — POCT GLYCOSYLATED HEMOGLOBIN (HGB A1C): Hemoglobin A1C: 4.7

## 2016-11-19 MED ORDER — ZOLPIDEM TARTRATE 10 MG PO TABS: ORAL_TABLET | ORAL | 0 refills | Status: DC

## 2016-11-19 MED ORDER — PROMETHAZINE HCL 12.5 MG PO TABS: ORAL_TABLET | ORAL | 0 refills | Status: DC

## 2016-11-19 MED ORDER — LANSOPRAZOLE 30 MG PO CPDR: 30.0000 mg | DELAYED_RELEASE_CAPSULE | Freq: Every day | ORAL | 1 refills | Status: DC

## 2016-11-19 NOTE — Progress Notes (Signed)
Name: Claire [name]   MRN: [id number]    DOB: Nov 01, 1970   Date:11/19/2016       Progress Note  Subjective  Chief Complaint  Chief Complaint  Patient presents with  . Diabetes  . Insomnia  . Anemia    HPI    Depression/Anxiety: she stopped medication months ago - sSRI, medications was making numb. She is feeling well,still has stress but able to cope with better, still takes alprazolam prn.   Insomnia: taking medication still wakes up at 2 to 3 am but able to fall back asleep. Still taking Ambien, unable to go down on dose of Ambien. She states her husband is snoring more and is having difficulty staying asleep. Discussed changing medication but she wants to hold off for now.   Asthma Mild Persistent: she is doing well now, no cough, wheezing or SOB, still taking Singulair daily, Ventolin not recently.   Migraine: she is still seeing Dr. Domingo Cocking - now off Topamax and is on Zonegran, and is doing well at this time, no pain at this time, however she is having new symptoms, headache on nuchal area, and over the past month she had 4 episodes of dizziness ( vertigo), episodes of nystagmus on both eyes, blurred vision, bilateral tinnitus, she is not sure if she has hearing loss. Dizziness is associated with nausea but no vomiting. She had one episode of dysarthria, explained that she will need to go to [facility] if symptoms of stroke returns  DMII: diagnosed at age 11, took medication for a while, but stopped all medications 10/25/2010 after bariatric surgery, glucose has been controlled, no polyphagia, polyuria or polydipsia. Maximum weight of 265 lbs and is down to 137.6 lbs .She wanted to lose more weight so we started Ozempic back in March 2018, she has lost 27 lbs since started on Ozempic, tolerating medication well.   GERD: improving again, treated for gastritis, lost weight, but back to normal now, she also has hiatal hernia, based on recent EGD, she is taking PPI once a day and  advised to only take H2 blocker prn if needed   Back pain: she states back pain got worse with tingling and numbness on both legs now, seen by [facility], and is seeing Dr. Dorcas Mcmurray, she had NCS/EMG by Dr. Domingo Cocking and it was normal, Dr. Dorcas Mcmurray did NCS/EMG on upper extremity and she has carpal tunnel bilaterally. No bowel or bladder incontinence. She is now on Lyrica ,still has some pain, but doing well, she also has pain on right sacro-iliac joint - chronic   Patient Active Problem List   Diagnosis Date Noted  . 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 Esophageal hiatal hernia 08/17/2016  . Diverticulosis of colon 08/17/2016  . Iron deficiency anemia 08/08/2015  . Lumbosacral pain 01/07/2015  . Muscle twitching 01/07/2015  . Intertrigo 11/05/2014  . Perennial allergic rhinitis 09/03/2014  . Lung nodules 09/03/2014  . Vitamin D deficiency 09/03/2014  . Diet-controlled diabetes mellitus ([place]) 09/03/2014  . History of kidney stones 09/03/2014  . Chronic insomnia 09/03/2014  . History of iron deficiency anemia 09/03/2014  . Depression with anxiety 09/03/2014  . Palpitations 06/22/2014  . History of bariatric surgery 12/03/2013  . Asthma, mild persistent 11/21/2012  . CN (constipation) 11/21/2012  . GERD without esophagitis 11/21/2012  . History of hyperlipidemia 11/21/2012  . Migraine with aura and without status migrainosus 11/21/2012  . History of sleep apnea 11/21/2012  . Morbid obesity ([place]) 11/21/2012  . IBS (irritable bowel  syndrome) 08/15/2012    Past Surgical History:  Procedure Laterality Date  . BLADDER SURGERY  2010  . BREAST BIOPSY Right 2014   stereotatic biopsy  . CHOLECYSTECTOMY  2010  . COLONOSCOPY  2014    Done at [facility]  . DILITATION & CURRETTAGE/HYSTROSCOPY WITH NOVASURE ABLATION N/A 06/16/2015   Procedure: DILATATION & CURETTAGE/HYSTEROSCOPY WITH NOVASURE ABLATION;  Surgeon: Brien Few, MD;  Location: [facility];  Service: Gynecology;  Laterality: N/A;   . [place] RESECTION  2012  . LAPAROSCOPY N/A 06/16/2015   Procedure: LAPAROSCOPY DIAGNOSTIC;  Surgeon: Brien Few, MD;  Location: [facility];  Service: Gynecology;  Laterality: N/A;  . LYSIS OF ADHESION N/A 06/16/2015   Procedure: LYSIS OF ADHESION;  Surgeon: Brien Few, MD;  Location: [facility];  Service: Gynecology;  Laterality: N/A;  . PLANTAR FASCIA SURGERY Bilateral   . ROBOTIC ASSISTED SALPINGO OOPHERECTOMY Right 06/16/2015   Procedure: ROBOTIC ASSISTED SALPINGO OOPHORECTOMY, EXCISION OF RIGHT CUL DE [place] MASS;  Surgeon: Brien Few, MD;  Location: [facility];  Service: Gynecology;  Laterality: Right;  . TONSILLECTOMY    . UPPER GI ENDOSCOPY      Family History  Problem Relation Age of Onset  . Heart attack Mother   . Stroke Mother   . Multiple myeloma Mother   . Hyperlipidemia Father   . Heart attack Sister   . Heart attack Maternal Uncle   . Heart attack Paternal Grandfather   . Breast cancer Paternal Aunt   . Breast cancer Paternal Grandmother   . Lung cancer Maternal Grandfather     Social History   Social History  . Marital status: Married    Spouse name: N/A  . Number of children: N/A  . Years of education: N/A   Occupational History  . Not on file.   Social History Main Topics  . Smoking status: Former Smoker    Packs/day: 1.00    Years: 10.00    Types: Cigarettes    Quit date: 02/19/1993  . Smokeless tobacco: Never Used  . Alcohol use 0.0 oz/week     Comment: occasionally  . Drug use: No  . Sexual activity: Yes    Partners: Male   Other Topics Concern  . Not on file   Social History Narrative  . No narrative on file     Current Outpatient Prescriptions:  .  ALPRAZolam (XANAX) 0.5 MG tablet, Take 1 tablet (0.5 mg total) by mouth at bedtime as needed for anxiety., Disp: 15 tablet, Rfl: 0 .  Ascorbic Acid (VITAMIN C) 1000 MG tablet, Take 1,000 mg by mouth., Disp: , Rfl:  .  calcium carbonate (OS-CAL) 600 MG TABS tablet, Take 600  mg by mouth daily with breakfast., Disp: , Rfl:  .  clobetasol ointment (TEMOVATE) 0.05 %, , Disp: , Rfl: 0 .  cyclobenzaprine (FLEXERIL) 10 MG tablet, Take 1 tablet (10 mg total) by mouth at bedtime. Prn, Disp: 90 tablet, Rfl: 1 .  ferrous sulfate 325 (65 FE) MG EC tablet, Take 325 mg by mouth daily with breakfast. , Disp: , Rfl:  .  fluconazole (DIFLUCAN) 150 MG tablet, Take 1 tablet (150 mg total) by mouth daily as needed., Disp: 9 tablet, Rfl: 1 .  lansoprazole (PREVACID) 30 MG capsule, Take 1 capsule (30 mg total) by mouth daily., Disp: 90 capsule, Rfl: 1 .  loratadine (CLARITIN) 10 MG tablet, Take 10 mg by mouth daily., Disp: , Rfl:  .  LYRICA 50 MG capsule, Take 1 capsule  by mouth daily., Disp: , Rfl:  .  mometasone (NASONEX) 50 MCG/ACT nasal spray, USE 2 SPRAYS IN EACH NOSTRIL AT BEDTIME, Disp: 51 g, Rfl: 1 .  montelukast (SINGULAIR) 10 MG tablet, TAKE 1 TABLET(10 MG) BY MOUTH AT BEDTIME, Disp: 90 tablet, Rfl: 1 .  OZEMPIC 1 MG/DOSE SOPN, INJECT 1 MG INTO THE SKIN ONCE A WEEK., Disp: 2 pen, Rfl: 2 .  Potassium Aminobenzoate 500 MG CAPS, Take 1 capsule by mouth daily., Disp: , Rfl:  .  promethazine (PHENERGAN) 12.5 MG tablet, TAKE 1 TABLET BY MOUTH EVERY 6 HOURS AS NEEDED FOR MIGRAINES WITH NAUSEA, Disp: 20 tablet, Rfl: 0 .  SUMAtriptan (IMITREX) 100 MG tablet, TAKE 1 TABLET AS NEEDED FOR MIGRAINE EPISODE, NO MORE THAN 2 IN 24 HOURS, Disp: 18 tablet, Rfl: 1 .  VENTOLIN HFA 108 (90 Base) MCG/ACT inhaler, USE 2 INHALATIONS EVERY 6 HOURS AS NEEDED FOR WHEEZING OR SHORTNESS OF BREATH, Disp: 54 g, Rfl: 0 .  vitamin B-12 (CYANOCOBALAMIN) 100 MCG tablet, Take 100 mcg by mouth as directed. , Disp: , Rfl:  .  Vitamin D, Ergocalciferol, (DRISDOL) 50000 units CAPS capsule, TAKE 1 CAPSULE EVERY 7 DAYS, Disp: 12 capsule, Rfl: 0 .  zolpidem (AMBIEN) 10 MG tablet, Must be [employer], Disp: 90 tablet, Rfl: 0 .  zonisamide (ZONEGRAN) 50 MG capsule, Take 3 capsules by mouth daily., Disp: , Rfl:  2  Allergies  Allergen Reactions  . Gabapentin     Bladder incontinence at higher dose  . Linzess [Linaclotide]     Worsening of diarrhea  . Other Hives and Other (See Comments)    Ricotta Cheese: flushed and hot Ricotta Cheese: flushed and hot Ricotta Cheese: flushed and hot  . Cymbalta [Duloxetine Hcl] Palpitations    And photosensitivity  . Vicodin [Hydrocodone-Acetaminophen] Itching     ROS  Constitutional: Negative for fever , positive for weight change.  Respiratory: Negative for cough and shortness of breath.   Cardiovascular: Negative for chest pain or palpitations.  Gastrointestinal: Negative for abdominal pain, no bowel changes.  Musculoskeletal: Negative for gait problem or joint swelling.  Skin: Negative for rash.  Neurological: Positive  for dizziness and  headache.  No other specific complaints in a complete review of systems (except as listed in HPI above).  Objective  Vitals:   11/19/16 1606  BP: 110/80  Pulse: 77  Resp: 14  Temp: (!) 97.4 F (36.3 C)  TempSrc: Oral  SpO2: 97%  Weight: 137 lb 9.6 oz (62.4 kg)    Body mass index is 26 kg/m.  Physical Exam  Constitutional: Patient appears well-developed and well-nourished. Overweight. No distress.  HEENT: head atraumatic, normocephalic, pupils equal and reactive to light, ears normal TM bilaterally, neck supple, throat within normal limits Cardiovascular: Normal rate, regular rhythm and normal heart sounds.  No murmur heard. No BLE edema. Pulmonary/Chest: Effort normal and breath sounds normal. No respiratory distress. Abdominal: Soft.  There is no tenderness. Psychiatric: Patient has a normal mood and affect. behavior is normal. Judgment and thought content normal. Neurological: no nystagmus, no focal findings, cranial nerves intact, romberg negative  Recent Results (from the past 2160 hour(s))  POCT HgB A1C     Status: Normal   Collection Time: 11/19/16  4:14 PM  Result Value Ref Range    Hemoglobin A1C 4.7   POCT UA - Microalbumin     Status: Normal   Collection Time: 11/19/16  4:21 PM  Result Value Ref Range   Microalbumin Ur,  POC NEG mg/L   Creatinine, POC NEG mg/dL   Albumin/Creatinine Ratio, Urine, POC NEG     Diabetic Foot Exam: Diabetic Foot Exam - Simple   Simple Foot Form Diabetic Foot exam was performed with the following findings:  Yes 11/19/2016  4:52 PM  Visual Inspection No deformities, no ulcerations, no other skin breakdown bilaterally:  Yes Sensation Testing Intact to touch and monofilament testing bilaterally:  Yes Pulse Check Posterior Tibialis and Dorsalis pulse intact bilaterally:  Yes Comments     PHQ2/9: Depression screen [facility] 2/9 06/18/2016 05/16/2016 02/22/2016 11/11/2015 08/08/2015  Decreased Interest 0 0 0 0 0  Down, Depressed, Hopeless 0 0 0 0 0  PHQ - 2 Score 0 0 0 0 0  Altered sleeping - - - - -  Tired, decreased energy - - - - -  Change in appetite - - - - -  Feeling bad or failure about yourself  - - - - -  Trouble concentrating - - - - -  Moving slowly or fidgety/restless - - - - -  Suicidal thoughts - - - - -  PHQ-9 Score - - - - -  Difficult doing work/chores - - - - -    Fall Risk: Fall Risk  06/18/2016 05/16/2016 02/22/2016 11/11/2015 08/08/2015  Falls in the past year? No No No No No     Assessment & Plan  1. Diet-controlled diabetes mellitus ([place])  - POCT HgB A1C - POCT UA - Microalbumin  2. Needs flu shot  - Flu Vaccine QUAD 36+ mos IM  3. Need for vaccination with 13-polyvalent pneumococcal conjugate vaccine  -[id number]   4. Morbid obesity ([place])  She is doing well since bariatric surgery, was gaining weight but losing again on Ozempic now  5. 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 History of bariatric surgery   7. Migraine with aura and without status migrainosus, not intractable  Possible vestibular migraine, she is seeing Dr. Domingo Cocking  - promethazine (PHENERGAN) 12.5 MG tablet;  TAKE 1 TABLET BY MOUTH EVERY 6 HOURS AS NEEDED FOR MIGRAINES WITH NAUSEA  Dispense: 20 tablet; Refill: 0 - Ambulatory referral to ENT  8. Mild persistent asthma without complication  Doing well on singulair  9. Need for vaccination for pneumococcus  - Pneumococcal conjugate vaccine 13-valent IM  10. Dizziness  - Ambulatory referral to ENT  11. Tinnitus of both ears  - Ambulatory referral to ENT  12. Nystagmus associated with vestibular disorder  - Ambulatory referral to ENT  13. Chronic insomnia  - zolpidem (AMBIEN) 10 MG tablet; Must be [employer]  Dispense: 90 tablet; Refill: 0  14. GERD without esophagitis  - lansoprazole (PREVACID) 30 MG capsule; Take 1 capsule (30 mg total) by mouth daily.  Dispense: 90 capsule; Refill: 1

## 2016-11-20 LAB — VITAMIN B12: Vitamin B-12: 147 pg/mL — ABNORMAL LOW (ref 200–1100)

## 2016-11-20 LAB — CBC WITH DIFFERENTIAL/PLATELET
BASOS ABS: 47 {cells}/uL (ref 0–200)
BASOS PCT: 0.7 %
Eosinophils Absolute: 281 cells/uL (ref 15–500)
Eosinophils Relative: 4.2 %
HCT: 41.3 % (ref 35.0–45.0)
HEMOGLOBIN: 13.8 g/dL (ref 11.7–15.5)
Lymphs Abs: 1233 cells/uL (ref 850–3900)
MCH: 29.3 pg (ref 27.0–33.0)
MCHC: 33.4 g/dL (ref 32.0–36.0)
MCV: 87.7 fL (ref 80.0–100.0)
MPV: 10.2 fL (ref 7.5–12.5)
Monocytes Relative: 7.5 %
NEUTROS ABS: 4636 {cells}/uL (ref 1500–7800)
Neutrophils Relative %: 69.2 %
PLATELETS: 287 10*3/uL (ref 140–400)
RBC: 4.71 10*6/uL (ref 3.80–5.10)
RDW: 12.6 % (ref 11.0–15.0)
TOTAL LYMPHOCYTE: 18.4 %
WBC mixed population: 503 cells/uL (ref 200–950)
WBC: 6.7 10*3/uL (ref 3.8–10.8)

## 2016-11-20 LAB — TSH: TSH: 1.76 m[IU]/L

## 2016-11-20 LAB — VITAMIN D 25 HYDROXY (VIT D DEFICIENCY, FRACTURES): Vit D, 25-Hydroxy: 46 ng/mL (ref 30–100)

## 2016-11-20 LAB — COMPLETE METABOLIC PANEL WITH GFR
AG Ratio: 1.8 (calc) (ref 1.0–2.5)
ALBUMIN MSPROF: 4.2 g/dL (ref 3.6–5.1)
ALT: 10 U/L (ref 6–29)
AST: 10 U/L (ref 10–30)
Alkaline phosphatase (APISO): 67 U/L (ref 33–115)
BUN: 9 mg/dL (ref 7–25)
CALCIUM: 8.9 mg/dL (ref 8.6–10.2)
CO2: 23 mmol/L (ref 20–32)
Chloride: 106 mmol/L (ref 98–110)
Creat: 0.78 mg/dL (ref 0.50–1.10)
GFR, EST AFRICAN AMERICAN: 107 mL/min/{1.73_m2} (ref >=60)
GFR, EST NON AFRICAN AMERICAN: 92 mL/min/{1.73_m2} (ref >=60)
GLUCOSE: 78 mg/dL (ref 65–99)
Globulin: 2.3 g/dL (calc) (ref 1.9–3.7)
Potassium: 3.8 mmol/L (ref 3.5–5.3)
Sodium: 137 mmol/L (ref 135–146)
TOTAL PROTEIN: 6.5 g/dL (ref 6.1–8.1)
Total Bilirubin: 0.7 mg/dL (ref 0.2–1.2)

## 2016-11-20 LAB — LIPID PANEL
CHOL/HDL RATIO: 2.6 (calc) (ref <5.0)
Cholesterol: 146 mg/dL (ref <200)
HDL: 57 mg/dL (ref >50)
LDL CHOLESTEROL (CALC): 72 mg/dL
NON-HDL CHOLESTEROL (CALC): 89 mg/dL (ref <130)
TRIGLYCERIDES: 86 mg/dL (ref <150)

## 2016-11-20 LAB — HEMOGLOBIN A1C
EAG (MMOL/L): 4.7 (calc)
Hgb A1c MFr Bld: 4.6 % of total Hgb (ref <5.7)
MEAN PLASMA GLUCOSE: 85 (calc)

## 2016-12-10 ENCOUNTER — Encounter: Payer: Self-pay | Admitting: Family Medicine

## 2016-12-11 ENCOUNTER — Ambulatory Visit (INDEPENDENT_AMBULATORY_CARE_PROVIDER_SITE_OTHER): Payer: BLUE CROSS/BLUE SHIELD | Admitting: Family Medicine

## 2016-12-11 ENCOUNTER — Encounter: Payer: Self-pay | Admitting: Family Medicine

## 2016-12-11 VITALS — BP 110/70 | HR 81 | Temp 98.4°F | Resp 16 | Ht 61.0 in | Wt 135.5 lb

## 2016-12-11 DIAGNOSIS — K5730 Diverticulosis of large intestine without perforation or abscess without bleeding: Secondary | ICD-10-CM | POA: Diagnosis not present

## 2016-12-11 DIAGNOSIS — K449 Diaphragmatic hernia without obstruction or gangrene: Secondary | ICD-10-CM | POA: Diagnosis not present

## 2016-12-11 DIAGNOSIS — K5792 Diverticulitis of intestine, part unspecified, without perforation or abscess without bleeding: Secondary | ICD-10-CM | POA: Diagnosis not present

## 2016-12-11 DIAGNOSIS — K219 Gastro-esophageal reflux disease without esophagitis: Secondary | ICD-10-CM | POA: Diagnosis not present

## 2016-12-11 MED ORDER — CIPROFLOXACIN HCL 500 MG PO TABS: 500.0000 mg | ORAL_TABLET | Freq: Two times a day (BID) | ORAL | 0 refills | Status: AC

## 2016-12-11 MED ORDER — METRONIDAZOLE 500 MG PO TABS: 500.0000 mg | ORAL_TABLET | Freq: Three times a day (TID) | ORAL | 0 refills | Status: AC

## 2016-12-11 NOTE — Progress Notes (Addendum)
Name: Claire [name]   MRN: [id number]    DOB: 01/12/1971   Date:12/11/2016       Progress Note  Subjective  Chief Complaint  Chief Complaint  Patient presents with  . Diverticulitis    HPI  Patient presents with 1-1.5 weeks of progressively worsening right-sided abdominal pain/right flank pain and diarrhea.  Each time she eats now, she develops significant abdominal pain, then has diarrhea within a few minutes. She is having 4-6 episodes of diarrhea per day.  She endorses occasional nausea but no vomiting.  Also endorses ongoing reflux (has chronic GERD and hiatal hernia) - taking Prevacid.  Denies urinary symptoms - no dysuria or urinary frequency; no black and tarry stools/bright red blood in stools, no pale/mucus-containing stools.  She has not tried any medications for her abdominal pain or diarrhea.  She reports this is very similar to her previous flares of diverticulosis.  She is s/p cholecystectomy - does not normally have lose BM's.    She has documented history of diverticulosis and is very careful to follow a diet avoiding triggers at home - she thinks she may have eaten something while out of her home that has caused a flare.  She had upper GI Endoscopy and Colonoscopy in June 2018 by Dr. Monte Fantasia with [facility].  This found hiatal hernia, diverticulosis.  PT is also s/p Gastric sleeve.  Patient Active Problem List   Diagnosis Date Noted  . Sacroiliac inflammation ([place]) 11/19/2016  . Esophageal hiatal hernia 08/17/2016  . Diverticulosis of colon 08/17/2016  . Iron deficiency anemia 08/08/2015  . Lumbosacral pain 01/07/2015  . Muscle twitching 01/07/2015  . Intertrigo 11/05/2014  . Perennial allergic rhinitis 09/03/2014  . Lung nodules 09/03/2014  . Vitamin D deficiency 09/03/2014  . Diet-controlled diabetes mellitus ([place]) 09/03/2014  . History of kidney stones 09/03/2014  . Chronic insomnia 09/03/2014  . History of iron deficiency anemia 09/03/2014  . Depression  with anxiety 09/03/2014  . Palpitations 06/22/2014  . History of bariatric surgery 12/03/2013  . Asthma, mild persistent 11/21/2012  . CN (constipation) 11/21/2012  . GERD without esophagitis 11/21/2012  . History of hyperlipidemia 11/21/2012  . Migraine with aura and without status migrainosus 11/21/2012  . History of sleep apnea 11/21/2012  . Morbid obesity ([place]) 11/21/2012  . IBS (irritable bowel syndrome) 08/15/2012    Social History  Substance Use Topics  . Smoking status: Former Smoker    Packs/day: 1.00    Years: 10.00    Types: Cigarettes    Quit date: 02/19/1993  . Smokeless tobacco: Never Used  . Alcohol use 0.0 oz/week     Comment: occasionally     Current Outpatient Prescriptions:  .  ALPRAZolam (XANAX) 0.5 MG tablet, Take 1 tablet (0.5 mg total) by mouth at bedtime as needed for anxiety., Disp: 15 tablet, Rfl: 0 .  Ascorbic Acid (VITAMIN C) 1000 MG tablet, Take 1,000 mg by mouth., Disp: , Rfl:  .  calcium carbonate (OS-CAL) 600 MG TABS tablet, Take 600 mg by mouth daily with breakfast., Disp: , Rfl:  .  clobetasol ointment (TEMOVATE) 0.05 %, , Disp: , Rfl: 0 .  cyclobenzaprine (FLEXERIL) 10 MG tablet, Take 1 tablet (10 mg total) by mouth at bedtime. Prn, Disp: 90 tablet, Rfl: 1 .  ferrous sulfate 325 (65 FE) MG EC tablet, Take 325 mg by mouth daily with breakfast. , Disp: , Rfl:  .  lansoprazole (PREVACID) 30 MG capsule, Take 1 capsule (30 mg total) by mouth  daily., Disp: 90 capsule, Rfl: 1 .  loratadine (CLARITIN) 10 MG tablet, Take 10 mg by mouth daily., Disp: , Rfl:  .  LYRICA 50 MG capsule, Take 1 capsule by mouth daily., Disp: , Rfl:  .  mometasone (NASONEX) 50 MCG/ACT nasal spray, USE 2 SPRAYS IN EACH NOSTRIL AT BEDTIME, Disp: 51 g, Rfl: 1 .  montelukast (SINGULAIR) 10 MG tablet, TAKE 1 TABLET(10 MG) BY MOUTH AT BEDTIME, Disp: 90 tablet, Rfl: 1 .  OZEMPIC 1 MG/DOSE SOPN, INJECT 1 MG INTO THE SKIN ONCE A WEEK., Disp: 2 pen, Rfl: 2 .  Potassium Aminobenzoate 500  MG CAPS, Take 1 capsule by mouth daily., Disp: , Rfl:  .  promethazine (PHENERGAN) 12.5 MG tablet, TAKE 1 TABLET BY MOUTH EVERY 6 HOURS AS NEEDED FOR MIGRAINES WITH NAUSEA, Disp: 20 tablet, Rfl: 0 .  SUMAtriptan (IMITREX) 100 MG tablet, TAKE 1 TABLET AS NEEDED FOR MIGRAINE EPISODE, NO MORE THAN 2 IN 24 HOURS, Disp: 18 tablet, Rfl: 1 .  VENTOLIN HFA 108 (90 Base) MCG/ACT inhaler, USE 2 INHALATIONS EVERY 6 HOURS AS NEEDED FOR WHEEZING OR SHORTNESS OF BREATH, Disp: 54 g, Rfl: 0 .  vitamin B-12 (CYANOCOBALAMIN) 100 MCG tablet, Take 100 mcg by mouth as directed. , Disp: , Rfl:  .  Vitamin D, Ergocalciferol, (DRISDOL) 50000 units CAPS capsule, TAKE 1 CAPSULE EVERY 7 DAYS, Disp: 12 capsule, Rfl: 0 .  zolpidem (AMBIEN) 10 MG tablet, Must be [employer], Disp: 90 tablet, Rfl: 0 .  zonisamide (ZONEGRAN) 50 MG capsule, Take 3 capsules by mouth daily., Disp: , Rfl: 2 .  fluconazole (DIFLUCAN) 150 MG tablet, Take 1 tablet (150 mg total) by mouth daily as needed. (Patient not taking: Reported on 12/11/2016), Disp: 9 tablet, Rfl: 1  Allergies  Allergen Reactions  . Gabapentin     Bladder incontinence at higher dose  . Linzess [Linaclotide]     Worsening of diarrhea  . Other Hives and Other (See Comments)    Ricotta Cheese: flushed and hot Ricotta Cheese: flushed and hot Ricotta Cheese: flushed and hot  . Cymbalta [Duloxetine Hcl] Palpitations    And photosensitivity  . Vicodin [Hydrocodone-Acetaminophen] Itching    ROS Constitutional: Negative for fever or weight change.  Respiratory: Negative for cough and shortness of breath.   Cardiovascular: Negative for chest pain or palpitations.  Gastrointestinal: See HPI Musculoskeletal: Negative for gait problem or joint swelling.  Skin: Negative for rash.  Neurological: Negative for dizziness or headache.  No other specific complaints in a complete review of systems (except as listed in HPI above).  Objective  Vitals:   12/11/16 0827  BP: 110/70   Pulse: 81  Resp: 16  Temp: 98.4 F (36.9 C)  TempSrc: Oral  SpO2: 96%  Weight: 135 lb 8 oz (61.5 kg)  Height: 5\' 1"  (1.549 m)   Body mass index is 25.6 kg/m.  Nursing Note and Vital Signs reviewed.  Physical Exam  Constitutional: Patient appears well-developed and well-nourished. Obese No distress.  HEENT: head atraumatic, normocephalic Cardiovascular: Normal rate, regular rhythm, S1/S2 present.  No murmur or rub heard. No BLE edema. Pulmonary/Chest: Effort normal and breath sounds clear. No respiratory distress or retractions. Abdominal: Soft abdomen, no HSM, no mass palpated; Tender along RUQ, bowel sounds present x4 quadrants.  No CVA Tenderness. Psychiatric: Patient has a normal mood and affect. behavior is normal. Judgment and thought content normal.  Recent Results (from the past 2160 hour(s))  CBC with Differential/Platelet     Status:  None   Collection Time: 11/19/16  9:21 AM  Result Value Ref Range   WBC 6.7 3.8 - 10.8 Thousand/uL   RBC 4.71 3.80 - 5.10 Million/uL   Hemoglobin 13.8 11.7 - 15.5 g/dL   HCT 41.3 35.0 - 45.0 %   MCV 87.7 80.0 - 100.0 fL   MCH 29.3 27.0 - 33.0 pg   MCHC 33.4 32.0 - 36.0 g/dL   RDW 12.6 11.0 - 15.0 %   Platelets 287 140 - 400 Thousand/uL   MPV 10.2 7.5 - 12.5 fL   Neutro Abs 4,636 1,500 - 7,800 cells/uL   Lymphs Abs 1,233 850 - 3,900 cells/uL   WBC mixed population 503 200 - 950 cells/uL   Eosinophils Absolute 281 15 - 500 cells/uL   Basophils Absolute 47 0 - 200 cells/uL   Neutrophils Relative % 69.2 %   Total Lymphocyte 18.4 %   Monocytes Relative 7.5 %   Eosinophils Relative 4.2 %   Basophils Relative 0.7 %  COMPLETE METABOLIC PANEL WITH GFR     Status: None   Collection Time: 11/19/16  9:21 AM  Result Value Ref Range   Glucose, Bld 78 65 - 99 mg/dL    Comment: .            Fasting reference interval .    BUN 9 7 - 25 mg/dL   Creat 0.78 0.50 - 1.10 mg/dL   GFR, Est Non African American 92 > OR = 60 mL/min/1.56m2    GFR, Est African American 107 > OR = 60 mL/min/1.20m2   BUN/Creatinine Ratio NOT APPLICABLE 6 - 22 (calc)   Sodium 137 135 - 146 mmol/L   Potassium 3.8 3.5 - 5.3 mmol/L   Chloride 106 98 - 110 mmol/L   CO2 23 20 - 32 mmol/L   Calcium 8.9 8.6 - 10.2 mg/dL   Total Protein 6.5 6.1 - 8.1 g/dL   Albumin 4.2 3.6 - 5.1 g/dL   Globulin 2.3 1.9 - 3.7 g/dL (calc)   AG Ratio 1.8 1.0 - 2.5 (calc)   Total Bilirubin 0.7 0.2 - 1.2 mg/dL   Alkaline phosphatase (APISO) 67 33 - 115 U/L   AST 10 10 - 30 U/L   ALT 10 6 - 29 U/L  Lipid panel     Status: None   Collection Time: 11/19/16  9:21 AM  Result Value Ref Range   Cholesterol 146 <200 mg/dL   HDL 57 >50 mg/dL   Triglycerides 86 <150 mg/dL   LDL Cholesterol (Calc) 72 mg/dL (calc)    Comment: Reference range: <100 . Desirable range <100 mg/dL for primary prevention;   <70 mg/dL for patients with CHD or diabetic patients  with > or = 2 CHD risk factors. Marland Kitchen LDL-C is now calculated using the Martin-Hopkins  calculation, which is a validated novel method providing  better accuracy than the Friedewald equation in the  estimation of LDL-C.  Cresenciano Genre et al. Annamaria Helling. 2595;638(75): 2061-2068  (http://education.[facility].com/faq/FAQ164)    Total CHOL/HDL Ratio 2.6 <5.0 (calc)   Non-HDL Cholesterol (Calc) 89 <130 mg/dL (calc)    Comment: For patients with diabetes plus 1 major ASCVD risk  factor, treating to a non-HDL-C goal of <100 mg/dL  (LDL-C of <70 mg/dL) is considered a therapeutic  option.   Vitamin B12     Status: Abnormal   Collection Time: 11/19/16  9:21 AM  Result Value Ref Range   Vitamin B-12 147 (L) 200 - 1,100 pg/mL  VITAMIN D 25  Hydroxy (Vit-D Deficiency, Fractures)     Status: None   Collection Time: 11/19/16  9:21 AM  Result Value Ref Range   Vit D, 25-Hydroxy 46 30 - 100 ng/mL    Comment: Vitamin D Status         25-OH Vitamin D: . Deficiency:                    <20 ng/mL Insufficiency:             20 - 29  ng/mL Optimal:                 > or = 30 ng/mL . For 25-OH Vitamin D testing on patients on  D2-supplementation and patients for whom quantitation  of D2 and D3 fractions is required, the [facility]ure[facility](TM) 25-OH VIT D, (D2,D3), LC/MS/MS is recommended: order  code 318 061 3142 (patients >56yrs). . For more information on this test, go to: http://education.[facility].com/faq/FAQ163 (This link is being provided for  informational/educational purposes only.)   Hemoglobin A1c     Status: None   Collection Time: 11/19/16  9:21 AM  Result Value Ref Range   Hgb A1c MFr Bld 4.6 <5.7 % of total Hgb    Comment: For the purpose of screening for the presence of diabetes: . <5.7%       Consistent with the absence of diabetes 5.7-6.4%    Consistent with increased risk for diabetes             (prediabetes) > or =6.5%  Consistent with diabetes . This assay result is consistent with a decreased risk of diabetes. . Currently, no consensus exists regarding use of hemoglobin A1c for diagnosis of diabetes in children. . According to American Diabetes Association (ADA) guidelines, hemoglobin A1c <7.0% represents optimal control in non-pregnant diabetic patients. Different metrics may apply to specific patient populations.  Standards of Medical Care in Diabetes(ADA). .    Mean Plasma Glucose 85 (calc)   eAG (mmol/L) 4.7 (calc)  TSH     Status: None   Collection Time: 11/19/16  9:21 AM  Result Value Ref Range   TSH 1.76 mIU/L    Comment:           Reference Range .           > or = 20 Years  0.40-4.50 .                Pregnancy Ranges           First trimester    0.26-2.66           Second trimester   0.55-2.73           Third trimester    0.43-2.91   POCT HgB A1C     Status: Normal   Collection Time: 11/19/16  4:14 PM  Result Value Ref Range   Hemoglobin A1C 4.7   POCT UA - Microalbumin     Status: Normal   Collection Time: 11/19/16  4:21 PM  Result Value Ref Range   Microalbumin  Ur, POC NEG mg/L   Creatinine, POC NEG mg/dL   Albumin/Creatinine Ratio, Urine, POC NEG      Assessment & Plan  1. Diverticulitis - ciprofloxacin (CIPRO) 500 MG tablet; Take 1 tablet (500 mg total) by mouth 2 (two) times daily.  Dispense: 20 tablet; Refill: 0 - metroNIDAZOLE (FLAGYL) 500 MG tablet; Take 1 tablet (500 mg total) by mouth 3 (three) times daily.  Dispense: 30 tablet; Refill:  0 - Advised to drink plenty of water and take probiotic - see AVS.  Advised that if she is not improving in 5-7 days, or if she worsens at all, she must return to clinic for further evaluation including labs and stool panel.  Pt is agreeable to this plan of care.  2. Diverticulosis of colon 3. Esophageal hiatal hernia 4. GERD without esophagitis - Advised to continue taking prevacid and avoiding reflux triggers.  -Red flags and when to present for emergency care or RTC including fever >101.59F, chest pain, shortness of breath, worsening abdominal pain, vomiting, worsening diarrhea, bloating/tension of the abdomen, new/worsening/un-resolving symptoms, reviewed with patient at time of visit. Follow up and care instructions discussed and provided in AVS.

## 2016-12-11 NOTE — Patient Instructions (Addendum)
Please take a probiotic while taking your antibiotics and for 7-10 days after completion. Please call our office to be re-evaluated if you are not improving in 5-7 days. DO NOT drink Alcohol while taking Flagyl or for 7 days after completion.  Diverticulitis Diverticulitis is when small pockets in your large intestine (colon) get infected or swollen. This causes stomach pain and watery poop (diarrhea). These pouches are called diverticula. They form in people who have a condition called diverticulosis. Follow these instructions at home: Medicines  Take over-the-counter and prescription medicines only as told by your doctor. These include: ? Antibiotics. ? Pain medicines. ? Fiber pills. ? Probiotics. ? Stool softeners.  Do not drive or use heavy machinery while taking prescription pain medicine.  If you were prescribed an antibiotic, take it as told. Do not stop taking it even if you feel better. General instructions  Follow a diet as told by your doctor.  When you feel better, your doctor may tell you to change your diet. You may need to eat a lot of fiber. Fiber makes it easier to poop (have bowel movements). Healthy foods with fiber include: ? Berries. ? Beans. ? Lentils. ? Green vegetables.  Exercise 3 or more times a week. Aim for 30 minutes each time. Exercise enough to sweat and make your heart beat faster.  Keep all follow-up visits as told. This is important. You may need to have an exam of the large intestine. This is called a colonoscopy. Contact a doctor if:  Your pain does not get better.  You have a hard time eating or drinking.  You are not pooping like normal. Get help right away if:  Your pain gets worse.  Your problems do not get better.  Your problems get worse very fast.  You have a fever.  You throw up (vomit) more than one time.  You have poop that is: ? Bloody. ? Black. ? Tarry. Summary  Diverticulitis is when small pockets in your large  intestine (colon) get infected or swollen.  Take medicines only as told by your doctor.  Follow a diet as told by your doctor. This information is not intended to replace advice given to you by your health care provider. Make sure you discuss any questions you have with your health care provider. Document Released: 07/25/2007 Document Revised: 02/23/2016 Document Reviewed: 02/23/2016 Elsevier Interactive Patient Education  2017 [employer].

## 2016-12-17 ENCOUNTER — Encounter: Payer: Self-pay | Admitting: Emergency Medicine

## 2016-12-17 ENCOUNTER — Emergency Department: Payer: BLUE CROSS/BLUE SHIELD

## 2016-12-17 ENCOUNTER — Emergency Department
Admission: EM | Admit: 2016-12-17 | Discharge: 2016-12-17 | Disposition: A | Discharge status: Home / Self Care | Payer: BLUE CROSS/BLUE SHIELD | Attending: Emergency Medicine | Admitting: Emergency Medicine

## 2016-12-17 ENCOUNTER — Ambulatory Visit: Payer: Self-pay | Admitting: *Deleted

## 2016-12-17 DIAGNOSIS — R109 Unspecified abdominal pain: Secondary | ICD-10-CM | POA: Diagnosis not present

## 2016-12-17 DIAGNOSIS — J45909 Unspecified asthma, uncomplicated: Secondary | ICD-10-CM | POA: Diagnosis not present

## 2016-12-17 DIAGNOSIS — Z79899 Other long term (current) drug therapy: Secondary | ICD-10-CM | POA: Insufficient documentation

## 2016-12-17 DIAGNOSIS — Z87891 Personal history of nicotine dependence: Secondary | ICD-10-CM | POA: Diagnosis not present

## 2016-12-17 DIAGNOSIS — R197 Diarrhea, unspecified: Secondary | ICD-10-CM | POA: Diagnosis not present

## 2016-12-17 DIAGNOSIS — E119 Type 2 diabetes mellitus without complications: Secondary | ICD-10-CM | POA: Diagnosis not present

## 2016-12-17 LAB — CBC
HCT: 41.6 % (ref 35.0–47.0)
HEMOGLOBIN: 14 g/dL (ref 12.0–16.0)
MCH: 30 pg (ref 26.0–34.0)
MCHC: 33.7 g/dL (ref 32.0–36.0)
MCV: 89 fL (ref 80.0–100.0)
Platelets: 250 10*3/uL (ref 150–440)
RBC: 4.67 MIL/uL (ref 3.80–5.20)
RDW: 13.9 % (ref 11.5–14.5)
WBC: 9.7 10*3/uL (ref 3.6–11.0)

## 2016-12-17 LAB — GASTROINTESTINAL PANEL BY PCR, STOOL (REPLACES STOOL CULTURE)

## 2016-12-17 LAB — COMPREHENSIVE METABOLIC PANEL
ALBUMIN: 4 g/dL (ref 3.5–5.0)
ALK PHOS: 57 U/L (ref 38–126)
ALT: 16 U/L (ref 14–54)
AST: 17 U/L (ref 15–41)
Anion gap: 4 — ABNORMAL LOW (ref 5–15)
BILIRUBIN TOTAL: 0.6 mg/dL (ref 0.3–1.2)
BUN: 7 mg/dL (ref 6–20)
CO2: 22 mmol/L (ref 22–32)
CREATININE: 0.69 mg/dL (ref 0.44–1.00)
Calcium: 8.8 mg/dL — ABNORMAL LOW (ref 8.9–10.3)
Chloride: 111 mmol/L (ref 101–111)
GFR calc Af Amer: >60 mL/min (ref >60)
GFR calc non Af Amer: >60 mL/min (ref >60)
GLUCOSE: 102 mg/dL — AB (ref 65–99)
Potassium: 3.2 mmol/L — ABNORMAL LOW (ref 3.5–5.1)
Sodium: 137 mmol/L (ref 135–145)
Total Protein: 7.2 g/dL (ref 6.5–8.1)

## 2016-12-17 LAB — URINALYSIS, COMPLETE (UACMP) WITH MICROSCOPIC
BILIRUBIN URINE: NEGATIVE
Bacteria, UA: NONE SEEN
Glucose, UA: NEGATIVE mg/dL
Hgb urine dipstick: NEGATIVE
KETONES UR: 80 mg/dL — AB
Nitrite: NEGATIVE
PROTEIN: 30 mg/dL — AB
Specific Gravity, Urine: 1.026 (ref 1.005–1.030)
pH: 5 (ref 5.0–8.0)

## 2016-12-17 LAB — LIPASE, BLOOD: Lipase: 31 U/L (ref 11–51)

## 2016-12-17 LAB — C DIFFICILE QUICK SCREEN W PCR REFLEX
C DIFFICILE (CDIFF) INTERP: NOT DETECTED
C DIFFICLE (CDIFF) ANTIGEN: NEGATIVE
C Diff toxin: NEGATIVE

## 2016-12-17 MED ORDER — IOPAMIDOL (ISOVUE-300) INJECTION 61%
30.0000 mL | Freq: Once | INTRAVENOUS | Status: AC | PRN
  Administered 2016-12-17: 30 mL via ORAL
  Filled 2016-12-17: qty 30

## 2016-12-17 MED ORDER — SODIUM CHLORIDE 0.9 % IV BOLUS (SEPSIS)
1000.0000 mL | Freq: Once | INTRAVENOUS | Status: AC
  Administered 2016-12-17: 1000 mL via INTRAVENOUS

## 2016-12-17 MED ORDER — ONDANSETRON HCL 4 MG PO TABS: 4.0000 mg | ORAL_TABLET | Freq: Three times a day (TID) | ORAL | 0 refills | Status: DC | PRN

## 2016-12-17 MED ORDER — FENTANYL CITRATE (PF) 100 MCG/2ML IJ SOLN
50.0000 ug | Freq: Once | INTRAMUSCULAR | Status: AC
  Administered 2016-12-17: 50 ug via INTRAVENOUS
  Filled 2016-12-17: qty 2

## 2016-12-17 MED ORDER — IOPAMIDOL (ISOVUE-300) INJECTION 61%
100.0000 mL | Freq: Once | INTRAVENOUS | Status: AC | PRN
  Administered 2016-12-17: 100 mL via INTRAVENOUS
  Filled 2016-12-17: qty 100

## 2016-12-17 NOTE — Telephone Encounter (Signed)
   Reason for Disposition . [1] SEVERE abdominal pain (e.g., excruciating) AND [2] present > 1 hour  Answer Assessment - Initial Assessment Questions 1. DIARRHEA SEVERITY: "How bad is the diarrhea?" "How many extra stools have you had in the past 24 hours than normal?"    - MILD: Few loose or mushy BMs; increase of 1-3 stools over normal daily number of stools; mild increase in ostomy output.   - MODERATE: Increase of 4-6 stools daily over normal; moderate increase in ostomy output.   - SEVERE (or Worst Possible): Increase of 7 or more stools daily over normal; moderate increase in ostomy output; incontinence.     Moderate- waterery,yellow, mucus 2. ONSET: "When did the diarrhea begin?"      Over 1 week- bloating and pain R side for 2 days 3. BM CONSISTENCY: "How loose or watery is the diarrhea?"      water 4. VOMITING: "Are you also vomiting?" If so, ask: "How many times in the past 24 hours?"      Nausea- patient did take a nausea mediation on Friday 5. ABDOMINAL PAIN: "Are you having any abdominal pain?" If yes: "What does it feel like?" (e.g., crampy, dull, intermittent, constant)      crampy 6. ABDOMINAL PAIN SEVERITY: If present, ask: "How bad is the pain?"  (e.g., Scale 1-10; mild, moderate, or severe)    - MILD (1-3): doesn't interfere with normal activities, abdomen soft and not tender to touch     - MODERATE (4-7): interferes with normal activities or awakens from sleep, tender to touch     - SEVERE (8-10): excruciating pain, doubled over, unable to do any normal activities       8 with no food- with food pain elevates (liqiud or not) 7. ORAL INTAKE: If vomiting, "Have you been able to drink liquids?" "How much fluids have you had in the past 24 hours?"     Patient is afraid to eat at this point 8. HYDRATION: "Any signs of dehydration?" (e.g., dry mouth [not just dry lips], too weak to stand, dizziness, new weight loss) "When did you last urinate?"     Weak- patient is not going a  lot- patient has has gastric sleeve surgery 9. EXPOSURE: "Have you traveled to a foreign country recently?" "Have you been exposed to anyone with diarrhea?" "Could you have eaten any food that was spoiled?"     No to all questions 10. OTHER SYMPTOMS: "Do you have any other symptoms?" (e.g., fever, blood in stool)       Pain is mainly on R side 11. PREGNANCY: "Is there any chance you are pregnant?" "When was your last menstrual period?"       No- vasectomy  Patient has history diverticulitis and thought this was a flare. She was using 2 antibiotics- patient has not had any improvement.  Protocols used: [facility]

## 2016-12-17 NOTE — ED Notes (Signed)
Patient to ed c/o abdominal pain, nausea and vomitting x1 week.   Worsening over the last 2 days. Reports increased bloating and RUQ pain. History of diverticulitis and was put on 2 antibiotics by PCP last Wednesday. Reports increased pain with eating. No improvement of symptoms with abx use or over the counter pain. Pt reports multiple episodes of bright yellow diarrhea.

## 2016-12-17 NOTE — ED Provider Notes (Addendum)
[facility] Emergency Department Provider Note  ____________________________________________   I have reviewed the triage vital signs and the nursing notes.   HISTORY  Chief Complaint Abdominal Pain    HPI Claire [name] is a 46 y.o. female who carries a dx of ibs, though she states this is not a formal dx, bariatric surgery, kidney stones, anxiety, asthma, depression, diverticulitis, kidney stones, patient states she was diagnosed with diverticulitis in June of this year and a CT scan however that CT scan is not available in care everywhere.  There is also question of a possible "blockage" on the right side of her abdomen although endoscopy and colonoscopy subsequently did not show any evidence of this, and there is some dispute apparently amongst her various physicians about whether there was actually a "blockage".  In any event, patient has had recurrent abdominal pain off and on since June and even longer she began to have significant diarrhea 2 weeks ago, again, consistent with prior presentation.  No recent antibiotics prior to that.  No recent travel prior to that.  She saw her doctor and they were empirically starting her on Cipro and Flagyl because of the right-sided cramping that they thought was consistent with her diverticular disease.  Patient has been on Cipro and Flagyl for 4 days.  She did not take it today because she feels nauseated.  She has not vomited she has not had a fever.  The pain is a crampy sharp right-sided pain sometimes worse with food.  She denies any chest pain or shortness of breath.  She status post cholecystectomy remotely.  She has had no melena no bright red blood per rectum.  Aside from the Cipro and Flagyl she has had no other acute interventions.  However she feels that she is worsening and her diarrhea is getting worse and she is not really eating or drinking or urinating very much and so she needs further assessment.   Past  Medical History:  Diagnosis Date  . Anemia   . Anxiety   . Asthma   . Bilateral leg cramps   . Depression   . Diabetes mellitus without complication ([place])    history no longer a problem since weight loss surgery  . DJD (degenerative joint disease)   . GERD (gastroesophageal reflux disease)   . Headache    Migraines  . IBS (irritable bowel syndrome)   . Kidney stone   . Lung nodules   . Muscle spasm   . Psoriasis    vaginal area  . Seasonal allergies   . Sleep apnea    history no longer a problem since weight loss surgery  . Vitamin D deficiency     Patient Active Problem List   Diagnosis Date Noted  . Sacroiliac inflammation ([place]) 11/19/2016  . Esophageal hiatal hernia 08/17/2016  . Diverticulosis of colon 08/17/2016  . Iron deficiency anemia 08/08/2015  . Lumbosacral pain 01/07/2015  . Muscle twitching 01/07/2015  . Intertrigo 11/05/2014  . Perennial allergic rhinitis 09/03/2014  . Lung nodules 09/03/2014  . Vitamin D deficiency 09/03/2014  . Diet-controlled diabetes mellitus ([place]) 09/03/2014  . History of kidney stones 09/03/2014  . Chronic insomnia 09/03/2014  . History of iron deficiency anemia 09/03/2014  . Depression with anxiety 09/03/2014  . Palpitations 06/22/2014  . History of bariatric surgery 12/03/2013  . Asthma, mild persistent 11/21/2012  . CN (constipation) 11/21/2012  . GERD without esophagitis 11/21/2012  . History of hyperlipidemia 11/21/2012  . Migraine with  aura and without status migrainosus 11/21/2012  . History of sleep apnea 11/21/2012  . Morbid obesity ([place]) 11/21/2012  . IBS (irritable bowel syndrome) 08/15/2012    Past Surgical History:  Procedure Laterality Date  . BLADDER SURGERY  2010  . BREAST BIOPSY Right 2014   stereotatic biopsy  . CHOLECYSTECTOMY  2010  . COLONOSCOPY  2014    Done at [facility]  . DILITATION & CURRETTAGE/HYSTROSCOPY WITH NOVASURE ABLATION N/A 06/16/2015   Procedure: DILATATION & CURETTAGE/HYSTEROSCOPY WITH  NOVASURE ABLATION;  Surgeon: Brien Few, MD;  Location: [facility];  Service: Gynecology;  Laterality: N/A;  . [place] RESECTION  2012  . LAPAROSCOPY N/A 06/16/2015   Procedure: LAPAROSCOPY DIAGNOSTIC;  Surgeon: Brien Few, MD;  Location: [facility];  Service: Gynecology;  Laterality: N/A;  . LYSIS OF ADHESION N/A 06/16/2015   Procedure: LYSIS OF ADHESION;  Surgeon: Brien Few, MD;  Location: [facility];  Service: Gynecology;  Laterality: N/A;  . PLANTAR FASCIA SURGERY Bilateral   . ROBOTIC ASSISTED SALPINGO OOPHERECTOMY Right 06/16/2015   Procedure: ROBOTIC ASSISTED SALPINGO OOPHORECTOMY, EXCISION OF RIGHT CUL DE [place] MASS;  Surgeon: Brien Few, MD;  Location: [facility];  Service: Gynecology;  Laterality: Right;  . TONSILLECTOMY    . UPPER GI ENDOSCOPY      Prior to Admission medications   Medication Sig Start Date End Date Taking? Authorizing Provider  ALPRAZolam Duanne Moron) 0.5 MG tablet Take 1 tablet (0.5 mg total) by mouth at bedtime as needed for anxiety. 08/17/16   Steele Sizer, MD  Ascorbic Acid (VITAMIN C) 1000 MG tablet Take 1,000 mg by mouth.    [provider]  calcium carbonate (OS-CAL) 600 MG TABS tablet Take 600 mg by mouth daily with breakfast.    [provider]  ciprofloxacin (CIPRO) 500 MG tablet Take 1 tablet (500 mg total) by mouth 2 (two) times daily. 12/11/16 12/21/16  Hubbard Hartshorn, FNP  clobetasol ointment (TEMOVATE) 0.05 %  06/06/15   [provider]  cyclobenzaprine (FLEXERIL) 10 MG tablet Take 1 tablet (10 mg total) by mouth at bedtime. Prn 11/11/15   Steele Sizer, MD  ferrous sulfate 325 (65 FE) MG EC tablet Take 325 mg by mouth daily with breakfast.     [provider]  lansoprazole (PREVACID) 30 MG capsule Take 1 capsule (30 mg total) by mouth daily. 11/19/16   Steele Sizer, MD  loratadine (CLARITIN) 10 MG tablet Take 10 mg by mouth daily.    [provider]  LYRICA 50 MG capsule Take 1 capsule by  mouth daily. 04/05/16   [provider]  meclizine (ANTIVERT) 25 MG tablet TK 1 T PO Q 8 H PRF VERTIGO 12/03/16   [provider]  metroNIDAZOLE (FLAGYL) 500 MG tablet Take 1 tablet (500 mg total) by mouth 3 (three) times daily. 12/11/16 12/21/16  Hubbard Hartshorn, FNP  mometasone (NASONEX) 50 MCG/ACT nasal spray USE 2 SPRAYS IN EACH NOSTRIL AT BEDTIME 05/02/15   Sowles, Drue Stager, MD  montelukast (SINGULAIR) 10 MG tablet TAKE 1 TABLET(10 MG) BY MOUTH AT BEDTIME 11/18/16   Sowles, Drue Stager, MD  OZEMPIC 1 MG/DOSE SOPN INJECT 1 MG INTO THE SKIN ONCE A WEEK. 09/23/16   Steele Sizer, MD  Potassium 99 MG TABS Take by mouth.    [provider]  Potassium Aminobenzoate 500 MG CAPS Take 1 capsule by mouth daily.    [provider]  promethazine (PHENERGAN) 12.5 MG tablet TAKE 1 TABLET BY MOUTH EVERY 6 HOURS  AS NEEDED FOR MIGRAINES WITH NAUSEA 11/19/16   Sowles, Drue Stager, MD  SUMAtriptan (IMITREX) 100 MG tablet TAKE 1 TABLET AS NEEDED FOR MIGRAINE EPISODE, NO MORE THAN 2 IN 24 HOURS 02/11/16   Sowles, Drue Stager, MD  VENTOLIN HFA 108 (90 Base) MCG/ACT inhaler USE 2 INHALATIONS EVERY 6 HOURS AS NEEDED FOR WHEEZING OR SHORTNESS OF BREATH 02/11/16   Steele Sizer, MD  vitamin B-12 (CYANOCOBALAMIN) 100 MCG tablet Take 100 mcg by mouth as directed.     [provider]  Vitamin D, Ergocalciferol, (DRISDOL) 50000 units CAPS capsule TAKE 1 CAPSULE EVERY 7 DAYS 11/05/16   Steele Sizer, MD  zolpidem (AMBIEN) 10 MG tablet Must be [employer] 11/19/16   Sowles, Drue Stager, MD  zonisamide (ZONEGRAN) 50 MG capsule Take 3 capsules by mouth daily. 02/10/16   Orie Rout, MD    Allergies Gabapentin; Linzess [linaclotide]; Other; Cymbalta [duloxetine hcl]; and Vicodin [hydrocodone-acetaminophen]  Family History  Problem Relation Age of Onset  . Heart attack Mother   . Stroke Mother   . Multiple myeloma Mother   . Hyperlipidemia Father   . Heart attack Sister   . Heart attack  Maternal Uncle   . Heart attack Paternal Grandfather   . Breast cancer Paternal Aunt   . Breast cancer Paternal Grandmother   . Lung cancer Maternal Grandfather     Social History Social History  Substance Use Topics  . Smoking status: Former Smoker    Packs/day: 1.00    Years: 10.00    Types: Cigarettes    Quit date: 02/19/1993  . Smokeless tobacco: Never Used  . Alcohol use 0.0 oz/week     Comment: occasionally    Review of Systems Constitutional: No fever/chills Eyes: No visual changes. ENT: No sore throat. No stiff neck no neck pain Cardiovascular: Denies chest pain. Respiratory: Denies shortness of breath. Gastrointestinal:   no vomiting.  Positive diarrhea.  No constipation. Genitourinary: Negative for dysuria. Musculoskeletal: Negative lower extremity swelling Skin: Negative for rash. Neurological: Negative for severe headaches, focal weakness or numbness.   ____________________________________________   PHYSICAL EXAM:  VITAL SIGNS: ED Triage Vitals [12/17/16 1355]  Enc Vitals Group     BP (!) 115/92     Pulse Rate 85     Resp 18     Temp 97.9 F (36.6 C)     Temp Source Oral     SpO2 100 %     Weight 130 lb (59 kg)     Height _0  (1.549 m)     Head Circumference      Peak Flow      Pain Score 8     Pain Loc      Pain Edu?      Excl. in [place]?     Constitutional: Alert and oriented. Well appearing and in no acute distress.  Well-appearing despite prolonged symptoms, Eyes: Conjunctivae are normal Head: Atraumatic HEENT: No congestion/rhinnorhea. Mucous membranes are moist.  Oropharynx non-erythematous Neck:   Nontender with no meningismus, no masses, no stridor Cardiovascular: Normal rate, regular rhythm. Grossly normal heart sounds.  Good peripheral circulation. Respiratory: Normal respiratory effort.  No retractions. Lungs CTAB. Abdominal: Soft and tender to palpation in the right upper quadrant, voluntary guarding no rebound. No distention.   Back:  There is no focal tenderness or step off.  there is no midline tenderness there are no lesions noted. there is no CVA tenderness  Musculoskeletal: No lower extremity tenderness, no upper extremity tenderness. No joint effusions,  no DVT signs strong distal pulses no edema Neurologic:  Normal speech and language. No gross focal neurologic deficits are appreciated.  Skin:  Skin is warm, dry and intact. No rash noted. Psychiatric: Mood and affect are normal. Speech and behavior are normal.  ____________________________________________   LABS (all labs ordered are listed, but only abnormal results are displayed)  Labs Reviewed  COMPREHENSIVE METABOLIC PANEL - Abnormal; Notable for the following:       Result Value   Potassium 3.2 (*)    Glucose, Bld 102 (*)    Calcium 8.8 (*)    Anion gap 4 (*)    All other components within normal limits  URINALYSIS, COMPLETE (UACMP) WITH MICROSCOPIC - Abnormal; Notable for the following:    Color, Urine AMBER (*)    APPearance CLOUDY (*)    Ketones, ur 80 (*)    Protein, ur 30 (*)    Leukocytes, UA SMALL (*)    Squamous Epithelial / LPF 6-30 (*)    All other components within normal limits  GASTROINTESTINAL PANEL BY PCR, STOOL (REPLACES STOOL CULTURE)  C DIFFICILE QUICK SCREEN W PCR REFLEX  LIPASE, BLOOD  CBC    Pertinent labs  results that were available during my care of the patient were reviewed by me and considered in my medical decision making (see chart for details). ____________________________________________  EKG  I personally interpreted any EKGs ordered by me or triage  ____________________________________________  RADIOLOGY  Pertinent labs & imaging results that were available during my care of the patient were reviewed by me and considered in my medical decision making (see chart for details). If possible, patient and/or family made aware of any abnormal findings. ____________________________________________     PROCEDURES  Procedure(s) performed: None  Procedures  Critical Care performed: None  ____________________________________________   INITIAL IMPRESSION / ASSESSMENT AND PLAN / ED COURSE  Pertinent labs & imaging results that were available during my care of the patient were reviewed by me and considered in my medical decision making (see chart for details).   With history of irritable bowel syndrome, anxiety, gastric surgery, as well as diverticulitis presents with abdominal pain and diarrhea.  We will send stool cultures blood work is reassuring, patient does have some ketones in her urine but kidney function is preserved rendering the suspicion for severe dehydration last although she certainly is probably having decreased p.o.  No vomiting.  She does not currently have a surgical abdomen.  She is already on antibiotics.  We will give her IV pain medication IV antiemetics, IV fluids and we will obtain CT of the abdomen pelvis to further evaluate this patient's discomfort is her multiple possible etiologies present including diverticular disease intussusception volvulus etc.  She can give [place] a stool sample we will send for C. difficile as well  ----------------------------------------- 6:16 PM on 12/17/2016 -----------------------------------------  Differential is negative by fire is pending, this may not come back in real-time, serial abdominal exams are completely benign, chronic abdominal pain with chronic recurrent diarrhea in the context of IBS, normal CT scan reassuring blood work etc. we will discharge her with close outpatient follow-up patient very reassured by her findings, no vomiting or significant diarrhea noted here.  She does have a GI doctor she will follow-up with them and PCP tomorrow.  Considering the patient's symptoms, medical history, and physical examination today, I have low suspicion for cholecystitis or biliary pathology, pancreatitis, perforation or bowel  obstruction, hernia, intra-abdominal abscess, AAA or dissection, volvulus or  intussusception, mesenteric ischemia, ischemic gut, pyelonephritis or appendicitis.   ____________________________________________   FINAL CLINICAL IMPRESSION(S) / ED DIAGNOSES  Final diagnoses:  None      This chart was dictated using voice recognition software.  Despite best efforts to proofread,  errors can occur which can change meaning.      Schuyler Amor, MD 12/17/16 1515    Schuyler Amor, MD 12/17/16 6015    Schuyler Amor, MD 12/17/16 [phone]

## 2016-12-17 NOTE — ED Triage Notes (Signed)
Patient complaining of abdominal pain, nausea and vomitting x1 week, worsening over the last 2 days. Reports increased bloating and RUQ pain. History of diverticulitis and was put on 2 antibiotics by PCP last Wednesday. Reports increased pain with eating. No improvement of symptoms with abx use or over the counter pain.

## 2016-12-17 NOTE — Discharge Instructions (Signed)
Finish the course of antibiotics return to the emergency room for fever, increased pain, bleeding, or any other new or worrisome symptoms.

## 2016-12-21 ENCOUNTER — Encounter: Payer: Self-pay | Admitting: Family Medicine

## 2016-12-21 ENCOUNTER — Ambulatory Visit (INDEPENDENT_AMBULATORY_CARE_PROVIDER_SITE_OTHER): Payer: BLUE CROSS/BLUE SHIELD | Admitting: Family Medicine

## 2016-12-21 ENCOUNTER — Telehealth: Payer: Self-pay

## 2016-12-21 VITALS — BP 110/88 | HR 85 | Resp 14 | Ht 61.0 in | Wt 133.7 lb

## 2016-12-21 DIAGNOSIS — R1011 Right upper quadrant pain: Secondary | ICD-10-CM

## 2016-12-21 DIAGNOSIS — I700 Atherosclerosis of aorta: Secondary | ICD-10-CM

## 2016-12-21 DIAGNOSIS — N200 Calculus of kidney: Secondary | ICD-10-CM

## 2016-12-21 DIAGNOSIS — Z9884 Bariatric surgery status: Secondary | ICD-10-CM

## 2016-12-21 DIAGNOSIS — E876 Hypokalemia: Secondary | ICD-10-CM

## 2016-12-21 DIAGNOSIS — K588 Other irritable bowel syndrome: Secondary | ICD-10-CM

## 2016-12-21 NOTE — Telephone Encounter (Signed)
That is probably the soonest she can be seen. You can call [facility] and see if they will see her next week

## 2016-12-21 NOTE — Telephone Encounter (Signed)
Patient called due to instructions on referral for Gastroenterology being unclear. The patient thought from what Dr. Ancil Boozer had told her that Dr. Ancil Boozer was going to call [facility] to consult with their physicians and get her a sooner appointment. Or did Dr. Ancil Boozer want patient to have a closer GI specialist? She called and stated Dr. Durwin Reges was full until December but another doctor in [place] could see her November 14. Is wanting clarification on what to do on the situation. Thanks.

## 2016-12-21 NOTE — Progress Notes (Signed)
Name: Claire [name]   MRN: [id number]    DOB: 1971-02-10   Date:12/21/2016       Progress Note  Subjective  Chief Complaint  Chief Complaint  Patient presents with  . Diverticulitis  . Flank Pain    HPI  RUQ pain and change in bowel movements: she was seen 10/23 by Raelyn Ensign with complaints of RUQ abdominal pain that had been present for about 10 days prior. She describes pain  as aching when not eating, but when she eats or drinks pain is described as sharp/stabbing RUQ. She states she also has a weird taste in her mouth, nothing tastes right. She also has noticed nausea, and at times LLQ pain described as cramping like and associated with watery stools, no blood or mucus. She was initially diagnosed with diverticulitis, but symptoms did not improve with antibiotics she was advised to go to [facility] and CT was only positive for for atherosclerosis of aorta and also non-obstructive kidney stones. She had a similar episode back in June of 2018 , seen by GI and had EGD and colonoscopy that were negative for colitis, gastritis, just  hiatal hernia. She is not feeling any better, missed all week at work. No fever .   Patient Active Problem List   Diagnosis Date Noted  . Atherosclerosis of aorta ([place]) 12/21/2016  . Kidney stones 12/21/2016  . Sacroiliac inflammation ([place]) 11/19/2016  . Esophageal hiatal hernia 08/17/2016  . Diverticulosis of colon 08/17/2016  . Iron deficiency anemia 08/08/2015  . Lumbosacral pain 01/07/2015  . Muscle twitching 01/07/2015  . Intertrigo 11/05/2014  . Perennial allergic rhinitis 09/03/2014  . Lung nodules 09/03/2014  . Vitamin D deficiency 09/03/2014  . Diet-controlled diabetes mellitus ([place]) 09/03/2014  . History of kidney stones 09/03/2014  . Chronic insomnia 09/03/2014  . History of iron deficiency anemia 09/03/2014  . Depression with anxiety 09/03/2014  . Palpitations 06/22/2014  . History of bariatric surgery 12/03/2013  . Asthma, mild persistent  11/21/2012  . CN (constipation) 11/21/2012  . GERD without esophagitis 11/21/2012  . History of hyperlipidemia 11/21/2012  . Migraine with aura and without status migrainosus 11/21/2012  . History of sleep apnea 11/21/2012  . Morbid obesity ([place]) 11/21/2012  . IBS (irritable bowel syndrome) 08/15/2012    Past Surgical History:  Procedure Laterality Date  . BLADDER SURGERY  2010  . BREAST BIOPSY Right 2014   stereotatic biopsy  . CHOLECYSTECTOMY  2010  . COLONOSCOPY  2014    [facility] at [facility]  . DILITATION & CURRETTAGE/HYSTROSCOPY WITH NOVASURE ABLATION N/A 06/16/2015   Procedure: DILATATION & CURETTAGE/HYSTEROSCOPY WITH NOVASURE ABLATION;  Surgeon: Brien Few, MD;  Location: [facility];  Service: Gynecology;  Laterality: N/A;  . [place] RESECTION  2012  . LAPAROSCOPY N/A 06/16/2015   Procedure: LAPAROSCOPY DIAGNOSTIC;  Surgeon: Brien Few, MD;  Location: [facility];  Service: Gynecology;  Laterality: N/A;  . LYSIS OF ADHESION N/A 06/16/2015   Procedure: LYSIS OF ADHESION;  Surgeon: Brien Few, MD;  Location: [facility];  Service: Gynecology;  Laterality: N/A;  . PLANTAR FASCIA SURGERY Bilateral   . ROBOTIC ASSISTED SALPINGO OOPHERECTOMY Right 06/16/2015   Procedure: ROBOTIC ASSISTED SALPINGO OOPHORECTOMY, EXCISION OF RIGHT CUL DE [place] MASS;  Surgeon: Brien Few, MD;  Location: [facility];  Service: Gynecology;  Laterality: Right;  . TONSILLECTOMY    . UPPER GI ENDOSCOPY      Family History  Problem Relation Age of Onset  . Heart attack Mother   .  Stroke Mother   . Multiple myeloma Mother   . Hyperlipidemia Father   . Heart attack Sister   . Heart attack Maternal Uncle   . Heart attack Paternal Grandfather   . Breast cancer Paternal Aunt   . Breast cancer Paternal Grandmother   . Lung cancer Maternal Grandfather     Social History   Social History  . Marital status: Married    Spouse name: N/A  . Number of children: N/A  . Years of education: N/A    Occupational History  . Not on file.   Social History Main Topics  . Smoking status: Former Smoker    Packs/day: 1.00    Years: 10.00    Types: Cigarettes    Quit date: 02/19/1993  . Smokeless tobacco: Never Used  . Alcohol use 0.0 oz/week     Comment: occasionally  . Drug use: No  . Sexual activity: Yes    Partners: Male   Other Topics Concern  . Not on file   Social History Narrative  . No narrative on file     Current Outpatient Prescriptions:  .  ALPRAZolam (XANAX) 0.5 MG tablet, Take 1 tablet (0.5 mg total) by mouth at bedtime as needed for anxiety., Disp: 15 tablet, Rfl: 0 .  Ascorbic Acid (VITAMIN C) 1000 MG tablet, Take 1,000 mg by mouth., Disp: , Rfl:  .  calcium carbonate (OS-CAL) 600 MG TABS tablet, Take 600 mg by mouth daily with breakfast., Disp: , Rfl:  .  ciprofloxacin (CIPRO) 500 MG tablet, Take 1 tablet (500 mg total) by mouth 2 (two) times daily., Disp: 20 tablet, Rfl: 0 .  clobetasol ointment (TEMOVATE) 0.05 %, , Disp: , Rfl: 0 .  cyclobenzaprine (FLEXERIL) 10 MG tablet, Take 1 tablet (10 mg total) by mouth at bedtime. Prn, Disp: 90 tablet, Rfl: 1 .  ferrous sulfate 325 (65 FE) MG EC tablet, Take 325 mg by mouth daily with breakfast. , Disp: , Rfl:  .  lansoprazole (PREVACID) 30 MG capsule, Take 1 capsule (30 mg total) by mouth daily., Disp: 90 capsule, Rfl: 1 .  loratadine (CLARITIN) 10 MG tablet, Take 10 mg by mouth daily., Disp: , Rfl:  .  LYRICA 50 MG capsule, Take 1 capsule by mouth daily., Disp: , Rfl:  .  meclizine (ANTIVERT) 25 MG tablet, TK 1 T PO Q 8 H PRF VERTIGO, Disp: , Rfl: 10 .  metroNIDAZOLE (FLAGYL) 500 MG tablet, Take 1 tablet (500 mg total) by mouth 3 (three) times daily., Disp: 30 tablet, Rfl: 0 .  mometasone (NASONEX) 50 MCG/ACT nasal spray, USE 2 SPRAYS IN EACH NOSTRIL AT BEDTIME, Disp: 51 g, Rfl: 1 .  montelukast (SINGULAIR) 10 MG tablet, TAKE 1 TABLET(10 MG) BY MOUTH AT BEDTIME, Disp: 90 tablet, Rfl: 1 .  ondansetron (ZOFRAN) 4 MG  tablet, Take 1 tablet (4 mg total) by mouth every 8 (eight) hours as needed for nausea or vomiting., Disp: 8 tablet, Rfl: 0 .  OZEMPIC 1 MG/DOSE SOPN, INJECT 1 MG INTO THE SKIN ONCE A WEEK., Disp: 2 pen, Rfl: 2 .  Potassium 99 MG TABS, Take by mouth., Disp: , Rfl:  .  Potassium Aminobenzoate 500 MG CAPS, Take 1 capsule by mouth daily., Disp: , Rfl:  .  promethazine (PHENERGAN) 12.5 MG tablet, TAKE 1 TABLET BY MOUTH EVERY 6 HOURS AS NEEDED FOR MIGRAINES WITH NAUSEA, Disp: 20 tablet, Rfl: 0 .  SUMAtriptan (IMITREX) 100 MG tablet, TAKE 1 TABLET AS NEEDED FOR MIGRAINE EPISODE,  NO MORE THAN 2 IN 24 HOURS, Disp: 18 tablet, Rfl: 1 .  VENTOLIN HFA 108 (90 Base) MCG/ACT inhaler, USE 2 INHALATIONS EVERY 6 HOURS AS NEEDED FOR WHEEZING OR SHORTNESS OF BREATH, Disp: 54 g, Rfl: 0 .  vitamin B-12 (CYANOCOBALAMIN) 100 MCG tablet, Take 100 mcg by mouth as directed. , Disp: , Rfl:  .  Vitamin D, Ergocalciferol, (DRISDOL) 50000 units CAPS capsule, TAKE 1 CAPSULE EVERY 7 DAYS, Disp: 12 capsule, Rfl: 0 .  zolpidem (AMBIEN) 10 MG tablet, Must be [employer], Disp: 90 tablet, Rfl: 0 .  zonisamide (ZONEGRAN) 50 MG capsule, Take 3 capsules by mouth daily., Disp: , Rfl: 2  Allergies  Allergen Reactions  . Gabapentin     Bladder incontinence at higher dose  . Linzess [Linaclotide]     Worsening of diarrhea  . Other Hives and Other (See Comments)    Ricotta Cheese: flushed and hot Ricotta Cheese: flushed and hot Ricotta Cheese: flushed and hot  . Cymbalta [Duloxetine Hcl] Palpitations    And photosensitivity  . Vicodin [Hydrocodone-Acetaminophen] Itching     ROS  Constitutional: Negative for fever , positive for weight change. - 4 lbs in the past month Respiratory: Negative for cough and shortness of breath.   Cardiovascular: Negative for chest pain or palpitations.  Gastrointestinal: Positive  for abdominal pain and  bowel changes.  Musculoskeletal: Negative for gait problem or joint swelling.  Skin:  Negative for rash.  Neurological: Negative for dizziness or headache.  No other specific complaints in a complete review of systems (except as listed in HPI above).  Objective  Vitals:   12/21/16 0755  BP: 110/88  Pulse: 85  Resp: 14  SpO2: 98%  Weight: 133 lb 11.2 oz (60.6 kg)  Height: 5' 1"  (1.549 m)    Body mass index is 25.26 kg/m.  Physical Exam  Constitutional: Patient appears well-developed and well-nourished.  No distress.  HEENT: head atraumatic, normocephalic, pupils equal and reactive to light,  neck supple, throat within normal limits Cardiovascular: Normal rate, regular rhythm and normal heart sounds.  No murmur heard. No BLE edema. Pulmonary/Chest: Effort normal and breath sounds normal. No respiratory distress. Abdominal: Soft.  There is epigastric and RUQ tenderness. Increase in bowel sounds Psychiatric: Patient has a normal mood and affect. behavior is normal. Judgment and thought content normal.  Recent Results (from the past 2160 hour(s))  CBC with Differential/Platelet     Status: None   Collection Time: 11/19/16  9:21 AM  Result Value Ref Range   WBC 6.7 3.8 - 10.8 Thousand/uL   RBC 4.71 3.80 - 5.10 Million/uL   Hemoglobin 13.8 11.7 - 15.5 g/dL   HCT 41.3 35.0 - 45.0 %   MCV 87.7 80.0 - 100.0 fL   MCH 29.3 27.0 - 33.0 pg   MCHC 33.4 32.0 - 36.0 g/dL   RDW 12.6 11.0 - 15.0 %   Platelets 287 140 - 400 Thousand/uL   MPV 10.2 7.5 - 12.5 fL   Neutro Abs 4,636 1,500 - 7,800 cells/uL   Lymphs Abs 1,233 850 - 3,900 cells/uL   WBC mixed population 503 200 - 950 cells/uL   Eosinophils Absolute 281 15 - 500 cells/uL   Basophils Absolute 47 0 - 200 cells/uL   Neutrophils Relative % 69.2 %   Total Lymphocyte 18.4 %   Monocytes Relative 7.5 %   Eosinophils Relative 4.2 %   Basophils Relative 0.7 %  COMPLETE METABOLIC PANEL WITH GFR  Status: None   Collection Time: 11/19/16  9:21 AM  Result Value Ref Range   Glucose, Bld 78 65 - 99 mg/dL    Comment:  .            Fasting reference interval .    BUN 9 7 - 25 mg/dL   Creat 0.78 0.50 - 1.10 mg/dL   GFR, Est Non African American 92 > OR = 60 mL/min/1.50m   GFR, Est African American 107 > OR = 60 mL/min/1.713m  BUN/Creatinine Ratio NOT APPLICABLE 6 - 22 (calc)   Sodium 137 135 - 146 mmol/L   Potassium 3.8 3.5 - 5.3 mmol/L   Chloride 106 98 - 110 mmol/L   CO2 23 20 - 32 mmol/L   Calcium 8.9 8.6 - 10.2 mg/dL   Total Protein 6.5 6.1 - 8.1 g/dL   Albumin 4.2 3.6 - 5.1 g/dL   Globulin 2.3 1.9 - 3.7 g/dL (calc)   AG Ratio 1.8 1.0 - 2.5 (calc)   Total Bilirubin 0.7 0.2 - 1.2 mg/dL   Alkaline phosphatase (APISO) 67 33 - 115 U/L   AST 10 10 - 30 U/L   ALT 10 6 - 29 U/L  Lipid panel     Status: None   Collection Time: 11/19/16  9:21 AM  Result Value Ref Range   Cholesterol 146 <200 mg/dL   HDL 57 >50 mg/dL   Triglycerides 86 <150 mg/dL   LDL Cholesterol (Calc) 72 mg/dL (calc)    Comment: Reference range: <100 . Desirable range <100 mg/dL for primary prevention;   <70 mg/dL for patients with CHD or diabetic patients  with > or = 2 CHD risk factors. . Marland KitchenDL-C is now calculated using the Martin-Hopkins  calculation, which is a validated novel method providing  better accuracy than the Friedewald equation in the  estimation of LDL-C.  MaCresenciano Genret al. JAAnnamaria [id number]  (http://education.[facility].com/faq/FAQ164)    Total CHOL/HDL Ratio 2.6 <5.0 (calc)   Non-HDL Cholesterol (Calc) 89 <130 mg/dL (calc)    Comment: For patients with diabetes plus 1 major ASCVD risk  factor, treating to a non-HDL-C goal of <100 mg/dL  (LDL-C of <70 mg/dL) is considered a therapeutic  option.   Vitamin B12     Status: Abnormal   Collection Time: 11/19/16  9:21 AM  Result Value Ref Range   Vitamin B-12 147 (L) 200 - 1,100 pg/mL  VITAMIN D 25 Hydroxy (Vit-D Deficiency, Fractures)     Status: None   Collection Time: 11/19/16  9:21 AM  Result Value Ref Range   Vit D, 25-Hydroxy 46  30 - 100 ng/mL    Comment: Vitamin D Status         25-OH Vitamin D: . Deficiency:                    <20 ng/mL Insufficiency:             20 - 29 ng/mL Optimal:                 > or = 30 ng/mL . For 25-OH Vitamin D testing on patients on  D2-supplementation and patients for whom quantitation  of D2 and D3 fractions is required, the Quest[facility]ureD(TM) 25-OH VIT D, (D2,D3), LC/MS/MS is recommended: order  code 92430 300 [id number] >2y45yr . For more information on this test, go to: http://education.[facility].com/faq/FAQ163 (This link is being provided for  informational/educational purposes only.)   Hemoglobin A1c  Status: None   Collection Time: 11/19/16  9:21 AM  Result Value Ref Range   Hgb A1c MFr Bld 4.6 <5.7 % of total Hgb    Comment: For the purpose of screening for the presence of diabetes: . <5.7%       Consistent with the absence of diabetes 5.7-6.4%    Consistent with increased risk for diabetes             (prediabetes) > or =6.5%  Consistent with diabetes . This assay result is consistent with a decreased risk of diabetes. . Currently, no consensus exists regarding use of hemoglobin A1c for diagnosis of diabetes in children. . According to American Diabetes Association (ADA) guidelines, hemoglobin A1c <7.0% represents optimal control in non-pregnant diabetic patients. Different metrics may apply to specific patient populations.  Standards of Medical Care in Diabetes(ADA). .    Mean Plasma Glucose 85 (calc)   eAG (mmol/L) 4.7 (calc)  TSH     Status: None   Collection Time: 11/19/16  9:21 AM  Result Value Ref Range   TSH 1.76 mIU/L    Comment:           Reference Range .           > or = 20 Years  0.40-4.50 .                Pregnancy Ranges           First trimester    0.26-2.66           Second trimester   0.55-2.73           Third trimester    0.43-2.91   POCT HgB A1C     Status: Normal   Collection Time: 11/19/16  4:14 PM  Result Value Ref  Range   Hemoglobin A1C 4.7   POCT UA - Microalbumin     Status: Normal   Collection Time: 11/19/16  4:21 PM  Result Value Ref Range   Microalbumin Ur, POC NEG mg/L   Creatinine, POC NEG mg/dL   Albumin/Creatinine Ratio, Urine, POC NEG   Lipase, blood     Status: None   Collection Time: 12/17/16  1:55 PM  Result Value Ref Range   Lipase 31 11 - 51 U/L  Comprehensive metabolic panel     Status: Abnormal   Collection Time: 12/17/16  1:55 PM  Result Value Ref Range   Sodium 137 135 - 145 mmol/L   Potassium 3.2 (L) 3.5 - 5.1 mmol/L   Chloride 111 101 - 111 mmol/L   CO2 22 22 - 32 mmol/L   Glucose, Bld 102 (H) 65 - 99 mg/dL   BUN 7 6 - 20 mg/dL   Creatinine, Ser 0.69 0.44 - 1.00 mg/dL   Calcium 8.8 (L) 8.9 - 10.3 mg/dL   Total Protein 7.2 6.5 - 8.1 g/dL   Albumin 4.0 3.5 - 5.0 g/dL   AST 17 15 - 41 U/L   ALT 16 14 - 54 U/L   Alkaline Phosphatase 57 38 - 126 U/L   Total Bilirubin 0.6 0.3 - 1.2 mg/dL   GFR calc non Af Amer >60 >60 mL/min   GFR calc Af Amer >60 >60 mL/min    Comment: (NOTE) The eGFR has been calculated using the CKD EPI equation. This calculation has not been validated in all clinical situations. eGFR's persistently <60 mL/min signify possible Chronic Kidney Disease.    Anion gap 4 (L) 5 - 15  CBC  Status: None   Collection Time: 12/17/16  1:55 PM  Result Value Ref Range   WBC 9.7 3.6 - 11.0 K/uL   RBC 4.67 3.80 - 5.20 MIL/uL   Hemoglobin 14.0 12.0 - 16.0 g/dL   HCT 41.6 35.0 - 47.0 %   MCV 89.0 80.0 - 100.0 fL   MCH 30.0 26.0 - 34.0 pg   MCHC 33.7 32.0 - 36.0 g/dL   RDW 13.9 11.5 - 14.5 %   Platelets 250 150 - 440 K/uL  Urinalysis, Complete w Microscopic     Status: Abnormal   Collection Time: 12/17/16  1:57 PM  Result Value Ref Range   Color, Urine AMBER (A) YELLOW    Comment: BIOCHEMICALS MAY BE AFFECTED BY COLOR   APPearance CLOUDY (A) CLEAR   Specific Gravity, Urine 1.026 1.005 - 1.030   pH 5.0 5.0 - 8.0   Glucose, UA NEGATIVE NEGATIVE mg/dL    Hgb urine dipstick NEGATIVE NEGATIVE   Bilirubin Urine NEGATIVE NEGATIVE   Ketones, ur 80 (A) NEGATIVE mg/dL   Protein, ur 30 (A) NEGATIVE mg/dL   Nitrite NEGATIVE NEGATIVE   Leukocytes, UA SMALL (A) NEGATIVE   RBC / HPF 6-30 0 - 5 RBC/hpf   WBC, UA 6-30 0 - 5 WBC/hpf   Bacteria, UA NONE SEEN NONE SEEN   Squamous Epithelial / LPF 6-30 (A) NONE SEEN   Mucus PRESENT    Hyaline Casts, UA PRESENT   Gastrointestinal Panel by PCR , Stool     Status: None   Collection Time: 12/17/16  4:48 PM  Result Value Ref Range   Campylobacter species NOT DETECTED NOT DETECTED   Plesimonas shigelloides NOT DETECTED NOT DETECTED   Salmonella species NOT DETECTED NOT DETECTED   Yersinia enterocolitica NOT DETECTED NOT DETECTED   Vibrio species NOT DETECTED NOT DETECTED   Vibrio cholerae NOT DETECTED NOT DETECTED   Enteroaggregative E coli (EAEC) NOT DETECTED NOT DETECTED   Enteropathogenic E coli (EPEC) NOT DETECTED NOT DETECTED   Enterotoxigenic E coli (ETEC) NOT DETECTED NOT DETECTED   Shiga like toxin producing E coli (STEC) NOT DETECTED NOT DETECTED   Shigella/Enteroinvasive E coli (EIEC) NOT DETECTED NOT DETECTED   Cryptosporidium NOT DETECTED NOT DETECTED   Cyclospora cayetanensis NOT DETECTED NOT DETECTED   Entamoeba histolytica NOT DETECTED NOT DETECTED   Giardia lamblia NOT DETECTED NOT DETECTED   Adenovirus F40/41 NOT DETECTED NOT DETECTED   Astrovirus NOT DETECTED NOT DETECTED   Norovirus GI/GII NOT DETECTED NOT DETECTED   Rotavirus A NOT DETECTED NOT DETECTED   Sapovirus (I, II, IV, and V) NOT DETECTED NOT DETECTED  C difficile quick scan w PCR reflex     Status: None   Collection Time: 12/17/16  4:48 PM  Result Value Ref Range   C Diff antigen NEGATIVE NEGATIVE   C Diff toxin NEGATIVE NEGATIVE   C Diff interpretation No C. difficile detected.      PHQ2/9: Depression screen [facility] 2/9 06/18/2016 05/16/2016 02/22/2016 11/11/2015 08/08/2015  Decreased Interest 0 0 0 0 0  Down, Depressed,  Hopeless 0 0 0 0 0  PHQ - 2 Score 0 0 0 0 0  Altered sleeping - - - - -  Tired, decreased energy - - - - -  Change in appetite - - - - -  Feeling bad or failure about yourself  - - - - -  Trouble concentrating - - - - -  Moving slowly or fidgety/restless - - - - -  Suicidal thoughts - - - - -  PHQ-9 Score - - - - -  Difficult doing work/chores - - - - -     Fall Risk: Fall Risk  12/21/2016 06/18/2016 05/16/2016 02/22/2016 11/11/2015  Falls in the past year? No No No No No    Functional Status Survey: Is the patient deaf or have difficulty hearing?: No Does the patient have difficulty seeing, even when wearing glasses/contacts?: No Does the patient have difficulty concentrating, remembering, or making decisions?: No Does the patient have difficulty walking or climbing stairs?: No Does the patient have difficulty dressing or bathing?: No Does the patient have difficulty doing errands alone such as visiting a doctor's office or shopping?: No   Assessment & Plan  1. Right upper quadrant pain  - H. pylori breath test Biopsy negative, however patient concerned about h. Pylori being cause of her symptoms, we will check urea breath test.  Discussed SOD, even though does not seem to fit the picture since she has mild constant pain. We will refer her to surgeon for further evaluation. Already seen by GI, negative biopsy of stomach and duodenum back in June 2018 -Referral GI  2. Atherosclerosis of aorta ([place])  Discussed results of CT with patient, discussed aspirin and statin therapy, however we will hold off until abdominal pain symptoms resolve  3. Kidney stones  Both kidneys but no obstruction, unlikely to be the cause of RUQ pain   4. History of bariatric surgery    5. Other irritable bowel syndrome  Not sure if symptoms related to her IBS, remote history and this symptoms have been happening over the past 6 months.   6. Hypokalemia  - Potassium - Magnesium

## 2016-12-21 NOTE — Telephone Encounter (Signed)
Patient notified

## 2016-12-24 LAB — H. PYLORI BREATH TEST: H. PYLORI BREATH TEST: NOT DETECTED

## 2016-12-24 LAB — POTASSIUM: POTASSIUM: 3.9 mmol/L (ref 3.5–5.3)

## 2016-12-24 LAB — MAGNESIUM: MAGNESIUM: 1.8 mg/dL (ref 1.5–2.5)

## 2016-12-25 ENCOUNTER — Encounter: Payer: Self-pay | Admitting: Family Medicine

## 2016-12-25 ENCOUNTER — Ambulatory Visit: Payer: BLUE CROSS/BLUE SHIELD | Admitting: Gastroenterology

## 2016-12-25 ENCOUNTER — Encounter: Payer: Self-pay | Admitting: Gastroenterology

## 2016-12-25 VITALS — BP 113/67 | HR 86 | Temp 98.7°F | Ht 61.0 in | Wt 132.6 lb

## 2016-12-25 DIAGNOSIS — R1013 Epigastric pain: Secondary | ICD-10-CM

## 2016-12-25 DIAGNOSIS — K5900 Constipation, unspecified: Secondary | ICD-10-CM

## 2016-12-25 NOTE — Progress Notes (Signed)
[name], MD [address], [place], [place], [name], [id number] [place], [place], [name], [address] Phone: [phone]  Fax: [phone]  Consultation  Referring Provider:     Steele Sizer, MD Primary Care Physician:  Steele Sizer, MD Primary Gastroenterologist:  Dr. Bonna Gains         Reason for Referral:     Abdominal Pain  Date of Admission:  (Not on file) Date of Referral:  12/25/2016         HPI:   Claire [name] is a 46 y.o. female presents with abdominal pain intermittently for years of . It is located in the RUQ, 4/10 in severity, dull in quality. Pt. Has hx of gastric sleeve and fundoplication in 8916 and states her symptoms started in 2014. There is a whole binder of paperwork of previous notes she brings with her that show GI workup with previous GI physicians and include CT, EGDs and colonoscopies that have been normal. She has had hx of a cholecystectomy. She is on multiple medications and the only thing that she has seen that helps her is when she gets a colonoscopy pad. She states in June 2018 when she got her colonoscopy prepped and she had no symptoms all the way until October. She reports she is not normally constipated and has 1-2 bowel movements every day that she describes is formed, but after the bowel movement her abdominal discomfort does feel better. No nausea or vomiting.  Past Medical History:  Diagnosis Date  . Anemia   . Anxiety   . Asthma   . Bilateral leg cramps   . Depression   . Diabetes mellitus without complication ([place])    history no longer a problem since weight loss surgery  . DJD (degenerative joint disease)   . GERD (gastroesophageal reflux disease)   . Headache    Migraines  . IBS (irritable bowel syndrome)   . Kidney stone   . Lung nodules   . Muscle spasm   . Psoriasis    vaginal area  . Seasonal allergies   . Sleep apnea    history no longer a problem since weight loss surgery  . Vitamin D  deficiency     Past Surgical History:  Procedure Laterality Date  . BLADDER SURGERY  2010  . BREAST BIOPSY Right 2014   stereotatic biopsy  . CHOLECYSTECTOMY  2010  . COLONOSCOPY  2014    Done at [facility]  . [place] RESECTION  2012  . PLANTAR FASCIA SURGERY Bilateral   . TONSILLECTOMY    . UPPER GI ENDOSCOPY      Prior to Admission medications   Medication Sig Start Date End Date Taking? Authorizing Provider  ALPRAZolam Duanne Moron) 0.5 MG tablet Take 1 tablet (0.5 mg total) by mouth at bedtime as needed for anxiety. 08/17/16  Yes Sowles, Drue Stager, MD  Ascorbic Acid (VITAMIN C) 1000 MG tablet Take 1,000 mg by mouth.   Yes [provider]  calcium carbonate (OS-CAL) 600 MG TABS tablet Take 600 mg by mouth daily with breakfast.   Yes [provider]  clobetasol ointment (TEMOVATE) 0.05 %  06/06/15  Yes [provider]  cyclobenzaprine (FLEXERIL) 10 MG tablet Take 1 tablet (10 mg total) by mouth at bedtime. Prn 11/11/15  Yes Sowles, Drue Stager, MD  ferrous sulfate 325 (65 FE) MG EC tablet Take 325 mg by mouth daily with breakfast.    Yes [provider]  lansoprazole (PREVACID) 30 MG  capsule Take 1 capsule (30 mg total) by mouth daily. 11/19/16  Yes Sowles, Drue Stager, MD  loratadine (CLARITIN) 10 MG tablet Take 10 mg by mouth daily.   Yes [provider]  LYRICA 50 MG capsule Take 1 capsule by mouth daily. 04/05/16  Yes [provider]  meclizine (ANTIVERT) 25 MG tablet TK 1 T PO Q 8 H PRF VERTIGO 12/03/16  Yes [provider]  mometasone (NASONEX) 50 MCG/ACT nasal spray USE 2 SPRAYS IN EACH NOSTRIL AT BEDTIME 05/02/15  Yes Sowles, Drue Stager, MD  montelukast (SINGULAIR) 10 MG tablet TAKE 1 TABLET(10 MG) BY MOUTH AT BEDTIME 11/18/16  Yes Sowles, Drue Stager, MD  ondansetron (ZOFRAN) 4 MG tablet Take 1 tablet (4 mg total) by mouth every 8 (eight) hours as needed for nausea or vomiting. 12/17/16  Yes Schuyler Amor, MD  OZEMPIC 1  MG/DOSE SOPN INJECT 1 MG INTO THE SKIN ONCE A WEEK. 09/23/16  Yes Steele Sizer, MD  Potassium 99 MG TABS Take by mouth.   Yes [provider]  Potassium Aminobenzoate 500 MG CAPS Take 1 capsule by mouth daily.   Yes [provider]  promethazine (PHENERGAN) 12.5 MG tablet TAKE 1 TABLET BY MOUTH EVERY 6 HOURS AS NEEDED FOR MIGRAINES WITH NAUSEA 11/19/16  Yes Sowles, Drue Stager, MD  VENTOLIN HFA 108 (90 Base) MCG/ACT inhaler USE 2 INHALATIONS EVERY 6 HOURS AS NEEDED FOR WHEEZING OR SHORTNESS OF BREATH 02/11/16  Yes Sowles, Drue Stager, MD  vitamin B-12 (CYANOCOBALAMIN) 100 MCG tablet Take 100 mcg by mouth as directed.    Yes [provider]  Vitamin D, Ergocalciferol, (DRISDOL) 50000 units CAPS capsule TAKE 1 CAPSULE EVERY 7 DAYS 11/05/16  Yes Sowles, Drue Stager, MD  zolpidem (AMBIEN) 10 MG tablet Must be [employer] 11/19/16  Yes Sowles, Drue Stager, MD  zonisamide (ZONEGRAN) 50 MG capsule Take 3 capsules by mouth daily. 02/10/16  Yes Orie Rout, MD  SUMAtriptan (IMITREX) 100 MG tablet TAKE 1 TABLET AS NEEDED FOR MIGRAINE EPISODE, NO MORE THAN 2 IN 24 HOURS Patient not taking: Reported on 12/25/2016 02/11/16   Steele Sizer, MD    Family History  Problem Relation Age of Onset  . Heart attack Mother   . Stroke Mother   . Multiple myeloma Mother   . Hyperlipidemia Father   . Heart attack Sister   . Heart attack Maternal Uncle   . Heart attack Paternal Grandfather   . Breast cancer Paternal Aunt   . Breast cancer Paternal Grandmother   . Lung cancer Maternal Grandfather      Social History   Tobacco Use  . Smoking status: Former Smoker    Packs/day: 1.00    Years: 10.00    Pack years: 10.00    Types: Cigarettes    Last attempt to quit: 02/19/1993    Years since quitting: 23.8  . Smokeless tobacco: Never Used  Substance Use Topics  . Alcohol use: Yes    Alcohol/week: 0.0 oz    Comment: occasionally  . Drug use: No    Allergies as of 12/25/2016 - Review  Complete 12/25/2016  Allergen Reaction Noted  . Gabapentin  05/16/2016  . Linzess [linaclotide]  11/11/2015  . Other Hives and Other (See Comments) 06/30/2012  . Cymbalta [duloxetine hcl] Palpitations 09/24/2014  . Vicodin [hydrocodone-acetaminophen] Itching 07/07/2012    Review of Systems:    All systems reviewed and negative except where noted in HPI.   Physical Exam:  Vital signs in last 24 hours: @VSRANGES @   General:  Pleasant, cooperative in NAD Head:  Normocephalic and atraumatic. Eyes:   No icterus.   Conjunctiva pink. PERRLA. Ears:  Normal auditory acuity. Neck:  Supple; no masses or thyroidomegaly Lungs: Respirations even and unlabored. Lungs clear to auscultation bilaterally.   No wheezes, crackles, or rhonchi.  Heart:  Regular rate and rhythm;  Without murmur, clicks, rubs or gallops Abdomen:  Soft, nondistended, nontender. Normal bowel sounds. No appreciable masses or hepatomegaly.  No rebound or guarding.  Rectal:  Not performed. Msk:  Symmetrical without gross deformities.  Strength 5 over 5 upper extremity and lower extremity bilaterally  Extremities:  Without edema, cyanosis or clubbing. Neurologic:  Alert and oriented x3;  grossly normal neurologically. Skin:  Intact without significant lesions or rashes. Cervical Nodes:  No significant cervical adenopathy. Psych:  Alert and cooperative. Normal affect.  LAB RESULTS: No results for input(s): WBC, HGB, HCT, PLT in the last 72 hours. BMET No results for input(s): NA, K, CL, CO2, GLUCOSE, BUN, CREATININE, CALCIUM in the last 72 hours. LFT No results for input(s): PROT, ALBUMIN, AST, ALT, ALKPHOS, BILITOT, BILIDIR, IBILI in the last 72 hours. PT/INR No results for input(s): LABPROT, INR in the last 72 hours.  Reviewed from paperwork brought by patient that is pending scanning STUDIES: No results found.    Impression / Plan:   Claire [name] is a 46 y.o. y/o female with history of gastric sleeve at the  medication and thousand 12, with chronic abdominal pain intermittently since 2014 with previous extensive workup including EGDs, colonoscopy, CTs that have been unrevealing  Patient's previous endoscopies and labs and imaging are reassuring and did not show any etiology of her pain I reassured her of the same Since a colonoscopy prep helps her symptoms, and her symptoms improve after bowel movements, her symptoms are most likely related to constipation I have asked her to start taking MiraLAX at least 1 dose every morning with a goal of having a large soft bowel movements in the morning. I have asked her to titrate the Miralax appropriately to this goal. She is agreeable to trying this In the presence of recent workup and reassuring clinical, lab and imaging findings no indication for further workup at this time Will monitor her symptoms over time and provide reassurance if no other etiology is evident.   Thank you for involving me in the care of this patient.     Virgel Manifold, MD  12/25/2016, 2:01 PM

## 2016-12-25 NOTE — Patient Instructions (Signed)
Please use Miralax 17 grams daily as needed.   If you have any questions or concerns, please contact our office.

## 2016-12-25 NOTE — Progress Notes (Deleted)
Cephas Darby, MD [address]  [place]  [place], [place] [address]  Main: [phone]  Fax: [phone]    Gastroenterology Consultation  Referring Provider:     Steele Sizer, MD Primary Care Physician:  Steele Sizer, MD Primary Gastroenterologist:  Dr. Cephas Darby Reason for Consultation:     ***        HPI:   Claire [name] is a 46 y.o. female referred by Dr. Steele Sizer, MD  for consultation & management of ***  NSAIDs: ***  Antiplts/Anticoagulants/Anti thrombotics: ***  GI Procedures: ***  Past Medical History:  Diagnosis Date  . Anemia   . Anxiety   . Asthma   . Bilateral leg cramps   . Depression   . Diabetes mellitus without complication ([place])    history no longer a problem since weight loss surgery  . DJD (degenerative joint disease)   . GERD (gastroesophageal reflux disease)   . Headache    Migraines  . IBS (irritable bowel syndrome)   . Kidney stone   . Lung nodules   . Muscle spasm   . Psoriasis    vaginal area  . Seasonal allergies   . Sleep apnea    history no longer a problem since weight loss surgery  . Vitamin D deficiency     Past Surgical History:  Procedure Laterality Date  . BLADDER SURGERY  2010  . BREAST BIOPSY Right 2014   stereotatic biopsy  . CHOLECYSTECTOMY  2010  . COLONOSCOPY  2014    Done at [facility]  . [place] RESECTION  2012  . PLANTAR FASCIA SURGERY Bilateral   . TONSILLECTOMY    . UPPER GI ENDOSCOPY      Prior to Admission medications   Medication Sig Start Date End Date Taking? Authorizing Provider  ALPRAZolam Duanne Moron) 0.5 MG tablet Take 1 tablet (0.5 mg total) by mouth at bedtime as needed for anxiety. 08/17/16   Steele Sizer, MD  Ascorbic Acid (VITAMIN C) 1000 MG tablet Take 1,000 mg by mouth.    [provider]  calcium carbonate (OS-CAL) 600 MG TABS tablet Take 600 mg by mouth daily with breakfast.    [provider]  clobetasol ointment  (TEMOVATE) 0.05 %  06/06/15   [provider]  cyclobenzaprine (FLEXERIL) 10 MG tablet Take 1 tablet (10 mg total) by mouth at bedtime. Prn 11/11/15   Steele Sizer, MD  ferrous sulfate 325 (65 FE) MG EC tablet Take 325 mg by mouth daily with breakfast.     [provider]  lansoprazole (PREVACID) 30 MG capsule Take 1 capsule (30 mg total) by mouth daily. 11/19/16   Steele Sizer, MD  loratadine (CLARITIN) 10 MG tablet Take 10 mg by mouth daily.    [provider]  LYRICA 50 MG capsule Take 1 capsule by mouth daily. 04/05/16   [provider]  meclizine (ANTIVERT) 25 MG tablet TK 1 T PO Q 8 H PRF VERTIGO 12/03/16   [provider]  mometasone (NASONEX) 50 MCG/ACT nasal spray USE 2 SPRAYS IN EACH NOSTRIL AT BEDTIME 05/02/15   Sowles, Drue Stager, MD  montelukast (SINGULAIR) 10 MG tablet TAKE 1 TABLET(10 MG) BY MOUTH AT BEDTIME 11/18/16   Sowles, Drue Stager, MD  ondansetron (ZOFRAN) 4 MG tablet Take 1 tablet (4 mg total) by mouth every 8 (eight) hours as needed for nausea or vomiting. 12/17/16   Schuyler Amor, MD  OZEMPIC 1 MG/DOSE SOPN INJECT 1 MG INTO  THE SKIN ONCE A WEEK. 09/23/16   Steele Sizer, MD  Potassium 99 MG TABS Take by mouth.    [provider]  Potassium Aminobenzoate 500 MG CAPS Take 1 capsule by mouth daily.    [provider]  promethazine (PHENERGAN) 12.5 MG tablet TAKE 1 TABLET BY MOUTH EVERY 6 HOURS AS NEEDED FOR MIGRAINES WITH NAUSEA 11/19/16   Sowles, Drue Stager, MD  SUMAtriptan (IMITREX) 100 MG tablet TAKE 1 TABLET AS NEEDED FOR MIGRAINE EPISODE, NO MORE THAN 2 IN 24 HOURS 02/11/16   Sowles, Drue Stager, MD  VENTOLIN HFA 108 (90 Base) MCG/ACT inhaler USE 2 INHALATIONS EVERY 6 HOURS AS NEEDED FOR WHEEZING OR SHORTNESS OF BREATH 02/11/16   Steele Sizer, MD  vitamin B-12 (CYANOCOBALAMIN) 100 MCG tablet Take 100 mcg by mouth as directed.     [provider]  Vitamin D, Ergocalciferol, (DRISDOL) 50000 units CAPS capsule  TAKE 1 CAPSULE EVERY 7 DAYS 11/05/16   Steele Sizer, MD  zolpidem (AMBIEN) 10 MG tablet Must be [employer] 11/19/16   Sowles, Drue Stager, MD  zonisamide (ZONEGRAN) 50 MG capsule Take 3 capsules by mouth daily. 02/10/16   Orie Rout, MD    Family History  Problem Relation Age of Onset  . Heart attack Mother   . Stroke Mother   . Multiple myeloma Mother   . Hyperlipidemia Father   . Heart attack Sister   . Heart attack Maternal Uncle   . Heart attack Paternal Grandfather   . Breast cancer Paternal Aunt   . Breast cancer Paternal Grandmother   . Lung cancer Maternal Grandfather      Social History   Tobacco Use  . Smoking status: Former Smoker    Packs/day: 1.00    Years: 10.00    Pack years: 10.00    Types: Cigarettes    Last attempt to quit: 02/19/1993    Years since quitting: 23.8  . Smokeless tobacco: Never Used  Substance Use Topics  . Alcohol use: Yes    Alcohol/week: 0.0 oz    Comment: occasionally  . Drug use: No    Allergies as of 12/25/2016 - Review Complete 12/21/2016  Allergen Reaction Noted  . Gabapentin  05/16/2016  . Linzess [linaclotide]  11/11/2015  . Other Hives and Other (See Comments) 06/30/2012  . Cymbalta [duloxetine hcl] Palpitations 09/24/2014  . Vicodin [hydrocodone-acetaminophen] Itching 07/07/2012    Review of Systems:    All systems reviewed and negative except where noted in HPI.   Physical Exam:  There were no vitals taken for this visit. No LMP recorded. Patient has had an ablation.  General:   Alert,  Well-developed, well-nourished, pleasant and cooperative in NAD Head:  Normocephalic and atraumatic. Eyes:  Sclera clear, no icterus.   Conjunctiva pink. Ears:  Normal auditory acuity. Nose:  No deformity, discharge, or lesions. Mouth:  No deformity or lesions,oropharynx pink & moist. Neck:  Supple; no masses or thyromegaly. Lungs:  Respirations even and unlabored.  Clear throughout to auscultation.   No wheezes, crackles,  or rhonchi. No acute distress. Heart:  Regular rate and rhythm; no murmurs, clicks, rubs, or gallops. Abdomen:  Normal bowel sounds. Soft, non-tender and non-distended without masses, hepatosplenomegaly or hernias noted.  No guarding or rebound tenderness.   Rectal: Nor performed Msk:  Symmetrical without gross deformities. Good, equal movement & strength bilaterally. Pulses:  Normal pulses noted. Extremities:  No clubbing or edema.  No cyanosis. Neurologic:  Alert and oriented x3;  grossly normal neurologically. Skin:  Intact without significant lesions or rashes. No jaundice. Lymph Nodes:  No significant cervical adenopathy. Psych:  Alert and cooperative. Normal mood and affect.  Imaging Studies: ***  Assessment and Plan:   Claire [name] is a 46 y.o. female with ***   Follow up in ***   Cephas Darby, MD

## 2016-12-29 ENCOUNTER — Other Ambulatory Visit: Payer: Self-pay | Admitting: Family Medicine

## 2016-12-29 DIAGNOSIS — E119 Type 2 diabetes mellitus without complications: Secondary | ICD-10-CM

## 2016-12-31 ENCOUNTER — Encounter: Payer: Self-pay | Admitting: Family Medicine

## 2016-12-31 ENCOUNTER — Ambulatory Visit: Payer: BLUE CROSS/BLUE SHIELD | Admitting: Family Medicine

## 2016-12-31 VITALS — BP 100/76 | HR 79 | Resp 12 | Ht 61.0 in | Wt 133.9 lb

## 2016-12-31 DIAGNOSIS — K5900 Constipation, unspecified: Secondary | ICD-10-CM

## 2016-12-31 DIAGNOSIS — M542 Cervicalgia: Secondary | ICD-10-CM | POA: Diagnosis not present

## 2016-12-31 DIAGNOSIS — G43019 Migraine without aura, intractable, without status migrainosus: Secondary | ICD-10-CM | POA: Diagnosis not present

## 2016-12-31 DIAGNOSIS — G43839 Menstrual migraine, intractable, without status migrainosus: Secondary | ICD-10-CM | POA: Diagnosis not present

## 2016-12-31 DIAGNOSIS — K6289 Other specified diseases of anus and rectum: Secondary | ICD-10-CM | POA: Diagnosis not present

## 2016-12-31 DIAGNOSIS — G518 Other disorders of facial nerve: Secondary | ICD-10-CM | POA: Diagnosis not present

## 2016-12-31 DIAGNOSIS — R51 Headache: Secondary | ICD-10-CM | POA: Diagnosis not present

## 2016-12-31 DIAGNOSIS — R1011 Right upper quadrant pain: Secondary | ICD-10-CM | POA: Diagnosis not present

## 2016-12-31 DIAGNOSIS — M791 Myalgia, unspecified site: Secondary | ICD-10-CM | POA: Diagnosis not present

## 2016-12-31 MED ORDER — NYSTATIN-TRIAMCINOLONE 100000-0.1 UNIT/GM-% EX OINT: 1.0000 "application " | TOPICAL_OINTMENT | Freq: Two times a day (BID) | CUTANEOUS | 0 refills | Status: DC

## 2016-12-31 NOTE — Progress Notes (Signed)
Name: Claire [name]   MRN: [id number]    DOB: [birth year]   Date:12/31/2016       Progress Note  Subjective  Chief Complaint  Chief Complaint  Patient presents with  . GI Problem  . form    complete short term disabilty forms    HPI  RUQ pain and constipation: she has been out of work since 12/17/2016, symptoms started started beginning of October. She had labs done and multiple visits, seen by GI and was diagnosed with worsening of constipation and was given higher dose Miralax last week, she states pain is not constant now, but still having RUQ pain after meals, improves with bowel movements, but stools still not formed. Appetite slightly better, at least she has been able to eat solids and no longer nauseated. No fever or chills. Feeling weak, trying to stay hydrated, she has noticed anal irritation from wiping frequently. Advised to use baby wipes instead of toilette paper.   Patient Active Problem List   Diagnosis Date Noted  . Atherosclerosis of aorta ([place]) 12/21/2016  . Kidney stones 12/21/2016  . Sacroiliac inflammation ([place]) 11/19/2016  . Esophageal hiatal hernia 08/17/2016  . Diverticulosis of colon 08/17/2016  . Iron deficiency anemia 08/08/2015  . Lumbosacral pain 01/07/2015  . Muscle twitching 01/07/2015  . Intertrigo 11/05/2014  . Perennial allergic rhinitis 09/03/2014  . Lung nodules 09/03/2014  . Vitamin D deficiency 09/03/2014  . Diet-controlled diabetes mellitus ([place]) 09/03/2014  . History of kidney stones 09/03/2014  . Chronic insomnia 09/03/2014  . History of iron deficiency anemia 09/03/2014  . Depression with anxiety 09/03/2014  . Palpitations 06/22/2014  . History of bariatric surgery 12/03/2013  . Asthma, mild persistent 11/21/2012  . CN (constipation) 11/21/2012  . GERD without esophagitis 11/21/2012  . History of hyperlipidemia 11/21/2012  . Migraine with aura and without status migrainosus 11/21/2012  . History of sleep apnea 11/21/2012  .  Morbid obesity ([place]) 11/21/2012  . IBS (irritable bowel syndrome) 08/15/2012    Past Surgical History:  Procedure Laterality Date  . BLADDER SURGERY  2010  . BREAST BIOPSY Right 2014   stereotatic biopsy  . CHOLECYSTECTOMY  2010  . COLONOSCOPY  2014    Done at [facility]  . [place] RESECTION  2012  . PLANTAR FASCIA SURGERY Bilateral   . TONSILLECTOMY    . UPPER GI ENDOSCOPY      Family History  Problem Relation Age of Onset  . Heart attack Mother   . Stroke Mother   . Multiple myeloma Mother   . Hyperlipidemia Father   . Heart attack Sister   . Heart attack Maternal Uncle   . Heart attack Paternal Grandfather   . Breast cancer Paternal Aunt   . Breast cancer Paternal Grandmother   . Lung cancer Maternal Grandfather     Social History   Socioeconomic History  . Marital status: Married    Spouse name: Not on file  . Number of children: Not on file  . Years of education: Not on file  . Highest education level: Not on file  Social Needs  . Financial resource strain: Not on file  . Food insecurity - worry: Not on file  . Food insecurity - inability: Not on file  . Transportation needs - medical: Not on file  . Transportation needs - non-medical: Not on file  Occupational History  . Not on file  Tobacco Use  . Smoking status: Former Smoker    Packs/day: 1.00  Years: 10.00    Pack years: 10.00    Types: Cigarettes    Last attempt to quit: 02/19/1993    Years since quitting: 23.8  . Smokeless tobacco: Never Used  Substance and Sexual Activity  . Alcohol use: Yes    Alcohol/week: 0.0 oz    Comment: occasionally  . Drug use: No  . Sexual activity: Yes    Partners: Male  Other Topics Concern  . Not on file  Social History Narrative  . Not on file     Current Outpatient Medications:  .  ALPRAZolam (XANAX) 0.5 MG tablet, Take 1 tablet (0.5 mg total) by mouth at bedtime as needed for anxiety., Disp: 15 tablet, Rfl: 0 .  Ascorbic Acid  (VITAMIN C) 1000 MG tablet, Take 1,000 mg by mouth., Disp: , Rfl:  .  calcium carbonate (OS-CAL) 600 MG TABS tablet, Take 600 mg by mouth daily with breakfast., Disp: , Rfl:  .  clobetasol ointment (TEMOVATE) 0.05 %, , Disp: , Rfl: 0 .  cyclobenzaprine (FLEXERIL) 10 MG tablet, Take 1 tablet (10 mg total) by mouth at bedtime. Prn, Disp: 90 tablet, Rfl: 1 .  ferrous sulfate 325 (65 FE) MG EC tablet, Take 325 mg by mouth daily with breakfast. , Disp: , Rfl:  .  lansoprazole (PREVACID) 30 MG capsule, Take 1 capsule (30 mg total) by mouth daily., Disp: 90 capsule, Rfl: 1 .  loratadine (CLARITIN) 10 MG tablet, Take 10 mg by mouth daily., Disp: , Rfl:  .  LYRICA 50 MG capsule, Take 1 capsule by mouth daily., Disp: , Rfl:  .  meclizine (ANTIVERT) 25 MG tablet, TK 1 T PO Q 8 H PRF VERTIGO, Disp: , Rfl: 10 .  mometasone (NASONEX) 50 MCG/ACT nasal spray, USE 2 SPRAYS IN EACH NOSTRIL AT BEDTIME, Disp: 51 g, Rfl: 1 .  montelukast (SINGULAIR) 10 MG tablet, TAKE 1 TABLET(10 MG) BY MOUTH AT BEDTIME, Disp: 90 tablet, Rfl: 1 .  ondansetron (ZOFRAN) 4 MG tablet, Take 1 tablet (4 mg total) by mouth every 8 (eight) hours as needed for nausea or vomiting., Disp: 8 tablet, Rfl: 0 .  OZEMPIC 1 MG/DOSE SOPN, INJECT 1 MG INTO THE SKIN ONCE A WEEK., Disp: 9 mL, Rfl: 0 .  Potassium 99 MG TABS, Take by mouth., Disp: , Rfl:  .  Potassium Aminobenzoate 500 MG CAPS, Take 1 capsule by mouth daily., Disp: , Rfl:  .  promethazine (PHENERGAN) 12.5 MG tablet, TAKE 1 TABLET BY MOUTH EVERY 6 HOURS AS NEEDED FOR MIGRAINES WITH NAUSEA, Disp: 20 tablet, Rfl: 0 .  VENTOLIN HFA 108 (90 Base) MCG/ACT inhaler, USE 2 INHALATIONS EVERY 6 HOURS AS NEEDED FOR WHEEZING OR SHORTNESS OF BREATH, Disp: 54 g, Rfl: 0 .  vitamin B-12 (CYANOCOBALAMIN) 100 MCG tablet, Take 100 mcg by mouth as directed. , Disp: , Rfl:  .  Vitamin D, Ergocalciferol, (DRISDOL) 50000 units CAPS capsule, TAKE 1 CAPSULE EVERY 7 DAYS, Disp: 12 capsule, Rfl: 0 .  zolpidem (AMBIEN)  10 MG tablet, Must be [employer], Disp: 90 tablet, Rfl: 0 .  zonisamide (ZONEGRAN) 50 MG capsule, Take 3 capsules by mouth daily., Disp: , Rfl: 2 .  SUMAtriptan (IMITREX) 100 MG tablet, TAKE 1 TABLET AS NEEDED FOR MIGRAINE EPISODE, NO MORE THAN 2 IN 24 HOURS (Patient not taking: Reported on 12/25/2016), Disp: 18 tablet, Rfl: 1  Allergies  Allergen Reactions  . Gabapentin     Bladder incontinence at higher dose  . Linzess [Linaclotide]  Worsening of diarrhea  . Other Hives and Other (See Comments)    Ricotta Cheese: flushed and hot Ricotta Cheese: flushed and hot Ricotta Cheese: flushed and hot  . Cymbalta [Duloxetine Hcl] Palpitations    And photosensitivity  . Vicodin [Hydrocodone-Acetaminophen] Itching     ROS  Ten systems reviewed and is negative except as mentioned in HPI   Objective  Vitals:   12/31/16 1044  BP: 100/76  Pulse: 79  Resp: 12  SpO2: 98%  Weight: 133 lb 14.4 oz (60.7 kg)  Height: 5' 1"  (1.549 m)    Body mass index is 25.3 kg/m.  Physical Exam   Constitutional: Patient appears well-developed and well-nourished.  No distress.  HEENT: head atraumatic, normocephalic, pupils equal and reactive to light,  neck supple, throat within normal limits Cardiovascular: Normal rate, regular rhythm and normal heart sounds.  No murmur heard. No BLE edema. Pulmonary/Chest: Effort normal and breath sounds normal. No respiratory distress. Abdominal: Soft.  There is mild diffuse abdominal  Tenderness, but worse on epigastric area, increase in bowel sounds. . Psychiatric: Patient has a normal mood and affect. behavior is normal. Judgment and thought content normal.  Recent Results (from the past 2160 hour(s))  CBC with Differential/Platelet     Status: None   Collection Time: 11/19/16  9:21 AM  Result Value Ref Range   WBC 6.7 3.8 - 10.8 Thousand/uL   RBC 4.71 3.80 - 5.10 Million/uL   Hemoglobin 13.8 11.7 - 15.5 g/dL   HCT 41.3 35.0 - 45.0 %   MCV 87.7 80.0  - 100.0 fL   MCH 29.3 27.0 - 33.0 pg   MCHC 33.4 32.0 - 36.0 g/dL   RDW 12.6 11.0 - 15.0 %   Platelets 287 140 - 400 Thousand/uL   MPV 10.2 7.5 - 12.5 fL   Neutro Abs 4,636 1,500 - 7,800 cells/uL   Lymphs Abs 1,233 850 - 3,900 cells/uL   WBC mixed population 503 200 - 950 cells/uL   Eosinophils Absolute 281 15 - 500 cells/uL   Basophils Absolute 47 0 - 200 cells/uL   Neutrophils Relative % 69.2 %   Total Lymphocyte 18.4 %   Monocytes Relative 7.5 %   Eosinophils Relative 4.2 %   Basophils Relative 0.7 %  COMPLETE METABOLIC PANEL WITH GFR     Status: None   Collection Time: 11/19/16  9:21 AM  Result Value Ref Range   Glucose, Bld 78 65 - 99 mg/dL    Comment: .            Fasting reference interval .    BUN 9 7 - 25 mg/dL   Creat 0.78 0.50 - 1.10 mg/dL   GFR, Est Non African American 92 > OR = 60 mL/min/1.65m   GFR, Est African American 107 > OR = 60 mL/min/1.770m  BUN/Creatinine Ratio NOT APPLICABLE 6 - 22 (calc)   Sodium 137 135 - 146 mmol/L   Potassium 3.8 3.5 - 5.3 mmol/L   Chloride 106 98 - 110 mmol/L   CO2 23 20 - 32 mmol/L   Calcium 8.9 8.6 - 10.2 mg/dL   Total Protein 6.5 6.1 - 8.1 g/dL   Albumin 4.2 3.6 - 5.1 g/dL   Globulin 2.3 1.9 - 3.7 g/dL (calc)   AG Ratio 1.8 1.0 - 2.5 (calc)   Total Bilirubin 0.7 0.2 - 1.2 mg/dL   Alkaline phosphatase (APISO) 67 33 - 115 U/L   AST 10 10 - 30 U/L   ALT  10 6 - 29 U/L  Lipid panel     Status: None   Collection Time: 11/19/16  9:21 AM  Result Value Ref Range   Cholesterol 146 <200 mg/dL   HDL 57 >50 mg/dL   Triglycerides 86 <150 mg/dL   LDL Cholesterol (Calc) 72 mg/dL (calc)    Comment: Reference range: <100 . Desirable range <100 mg/dL for primary prevention;   <70 mg/dL for patients with CHD or diabetic patients  with > or = 2 CHD risk factors. Marland Kitchen LDL-C is now calculated using the Martin-Hopkins  calculation, which is a validated novel method providing  better accuracy than the Friedewald equation in the   estimation of LDL-C.  Cresenciano Genre et al. Annamaria Helling. 5974;163(84): 2061-2068  (http://education.[facility].com/faq/FAQ164)    Total CHOL/HDL Ratio 2.6 <5.0 (calc)   Non-HDL Cholesterol (Calc) 89 <130 mg/dL (calc)    Comment: For patients with diabetes plus 1 major ASCVD risk  factor, treating to a non-HDL-C goal of <100 mg/dL  (LDL-C of <70 mg/dL) is considered a therapeutic  option.   Vitamin B12     Status: Abnormal   Collection Time: 11/19/16  9:21 AM  Result Value Ref Range   Vitamin B-12 147 (L) 200 - 1,100 pg/mL  VITAMIN D 25 Hydroxy (Vit-D Deficiency, Fractures)     Status: None   Collection Time: 11/19/16  9:21 AM  Result Value Ref Range   Vit D, 25-Hydroxy 46 30 - 100 ng/mL    Comment: Vitamin D Status         25-OH Vitamin D: . Deficiency:                    <20 ng/mL Insufficiency:             20 - 29 ng/mL Optimal:                 > or = 30 ng/mL . For 25-OH Vitamin D testing on patients on  D2-supplementation and patients for whom quantitation  of D2 and D3 fractions is required, the [facility]AssureD(TM) 25-OH VIT D, (D2,D3), LC/MS/MS is recommended: order  code [id number] (patients >91yr). . For more information on this test, go to: http://education.[facility].com/faq/FAQ163 (This link is being provided for  informational/educational purposes only.)   Hemoglobin A1c     Status: None   Collection Time: 11/19/16  9:21 AM  Result Value Ref Range   Hgb A1c MFr Bld 4.6 <5.7 % of total Hgb    Comment: For the purpose of screening for the presence of diabetes: . <5.7%       Consistent with the absence of diabetes 5.7-6.4%    Consistent with increased risk for diabetes             (prediabetes) > or =6.5%  Consistent with diabetes . This assay result is consistent with a decreased risk of diabetes. . Currently, no consensus exists regarding use of hemoglobin A1c for diagnosis of diabetes in children. . According to American Diabetes Association  (ADA) guidelines, hemoglobin A1c <7.0% represents optimal control in non-pregnant diabetic patients. Different metrics may apply to specific patient populations.  Standards of Medical Care in Diabetes(ADA). .    Mean Plasma Glucose 85 (calc)   eAG (mmol/L) 4.7 (calc)  TSH     Status: None   Collection Time: 11/19/16  9:21 AM  Result Value Ref Range   TSH 1.76 mIU/L    Comment:  Reference Range .           > or = 20 Years  0.40-4.50 .                Pregnancy Ranges           First trimester    0.26-2.66           Second trimester   0.55-2.73           Third trimester    0.43-2.91   POCT HgB A1C     Status: Normal   Collection Time: 11/19/16  4:14 PM  Result Value Ref Range   Hemoglobin A1C 4.7   POCT UA - Microalbumin     Status: Normal   Collection Time: 11/19/16  4:21 PM  Result Value Ref Range   Microalbumin Ur, POC NEG mg/L   Creatinine, POC NEG mg/dL   Albumin/Creatinine Ratio, Urine, POC NEG   Lipase, blood     Status: None   Collection Time: 12/17/16  1:55 PM  Result Value Ref Range   Lipase 31 11 - 51 U/L  Comprehensive metabolic panel     Status: Abnormal   Collection Time: 12/17/16  1:55 PM  Result Value Ref Range   Sodium 137 135 - 145 mmol/L   Potassium 3.2 (L) 3.5 - 5.1 mmol/L   Chloride 111 101 - 111 mmol/L   CO2 22 22 - 32 mmol/L   Glucose, Bld 102 (H) 65 - 99 mg/dL   BUN 7 6 - 20 mg/dL   Creatinine, Ser 0.69 0.44 - 1.00 mg/dL   Calcium 8.8 (L) 8.9 - 10.3 mg/dL   Total Protein 7.2 6.5 - 8.1 g/dL   Albumin 4.0 3.5 - 5.0 g/dL   AST 17 15 - 41 U/L   ALT 16 14 - 54 U/L   Alkaline Phosphatase 57 38 - 126 U/L   Total Bilirubin 0.6 0.3 - 1.2 mg/dL   GFR calc non Af Amer >60 >60 mL/min   GFR calc Af Amer >60 >60 mL/min    Comment: (NOTE) The eGFR has been calculated using the CKD EPI equation. This calculation has not been validated in all clinical situations. eGFR's persistently <60 mL/min signify possible Chronic Kidney Disease.    Anion  gap 4 (L) 5 - 15  CBC     Status: None   Collection Time: 12/17/16  1:55 PM  Result Value Ref Range   WBC 9.7 3.6 - 11.0 K/uL   RBC 4.67 3.80 - 5.20 MIL/uL   Hemoglobin 14.0 12.0 - 16.0 g/dL   HCT 41.6 35.0 - 47.0 %   MCV 89.0 80.0 - 100.0 fL   MCH 30.0 26.0 - 34.0 pg   MCHC 33.7 32.0 - 36.0 g/dL   RDW 13.9 11.5 - 14.5 %   Platelets 250 150 - 440 K/uL  Urinalysis, Complete w Microscopic     Status: Abnormal   Collection Time: 12/17/16  1:57 PM  Result Value Ref Range   Color, Urine AMBER (A) YELLOW    Comment: BIOCHEMICALS MAY BE AFFECTED BY COLOR   APPearance CLOUDY (A) CLEAR   Specific Gravity, Urine 1.026 1.005 - 1.030   pH 5.0 5.0 - 8.0   Glucose, UA NEGATIVE NEGATIVE mg/dL   Hgb urine dipstick NEGATIVE NEGATIVE   Bilirubin Urine NEGATIVE NEGATIVE   Ketones, ur 80 (A) NEGATIVE mg/dL   Protein, ur 30 (A) NEGATIVE mg/dL   Nitrite NEGATIVE NEGATIVE   Leukocytes, UA SMALL (A)  NEGATIVE   RBC / HPF 6-30 0 - 5 RBC/hpf   WBC, UA 6-30 0 - 5 WBC/hpf   Bacteria, UA NONE SEEN NONE SEEN   Squamous Epithelial / LPF 6-30 (A) NONE SEEN   Mucus PRESENT    Hyaline Casts, UA PRESENT   Gastrointestinal Panel by PCR , Stool     Status: None   Collection Time: 12/17/16  4:48 PM  Result Value Ref Range   Campylobacter species NOT DETECTED NOT DETECTED   Plesimonas shigelloides NOT DETECTED NOT DETECTED   Salmonella species NOT DETECTED NOT DETECTED   Yersinia enterocolitica NOT DETECTED NOT DETECTED   Vibrio species NOT DETECTED NOT DETECTED   Vibrio cholerae NOT DETECTED NOT DETECTED   Enteroaggregative E coli (EAEC) NOT DETECTED NOT DETECTED   Enteropathogenic E coli (EPEC) NOT DETECTED NOT DETECTED   Enterotoxigenic E coli (ETEC) NOT DETECTED NOT DETECTED   Shiga like toxin producing E coli (STEC) NOT DETECTED NOT DETECTED   Shigella/Enteroinvasive E coli (EIEC) NOT DETECTED NOT DETECTED   Cryptosporidium NOT DETECTED NOT DETECTED   Cyclospora cayetanensis NOT DETECTED NOT DETECTED    Entamoeba histolytica NOT DETECTED NOT DETECTED   Giardia lamblia NOT DETECTED NOT DETECTED   Adenovirus F40/41 NOT DETECTED NOT DETECTED   Astrovirus NOT DETECTED NOT DETECTED   Norovirus GI/GII NOT DETECTED NOT DETECTED   Rotavirus A NOT DETECTED NOT DETECTED   Sapovirus (I, II, IV, and V) NOT DETECTED NOT DETECTED  C difficile quick scan w PCR reflex     Status: None   Collection Time: 12/17/16  4:48 PM  Result Value Ref Range   C Diff antigen NEGATIVE NEGATIVE   C Diff toxin NEGATIVE NEGATIVE   C Diff interpretation No C. difficile detected.   H. pylori breath test     Status: None   Collection Time: 12/21/16  9:06 AM  Result Value Ref Range   H. pylori Breath Test NOT DETECTED NOT DETECT    Comment: . Antimicrobials, proton pump inhibitors, and bismuth preparations are known to suppress H. pylori, and  ingestion of these prior to H. pylori diagnostic testing may lead to false negative results. If clinically  indicated, the test may be repeated on a new specimen obtained two weeks after discontinuing treatment. However, a positive result is still clinically valid.   Potassium     Status: None   Collection Time: 12/21/16  9:06 AM  Result Value Ref Range   Potassium 3.9 3.5 - 5.3 mmol/L  Magnesium     Status: None   Collection Time: 12/21/16  9:06 AM  Result Value Ref Range   Magnesium 1.8 1.5 - 2.5 mg/dL     PHQ2/9: Depression screen [facility] 2/9 06/18/2016 05/16/2016 02/22/2016 11/11/2015 08/08/2015  Decreased Interest 0 0 0 0 0  Down, Depressed, Hopeless 0 0 0 0 0  PHQ - 2 Score 0 0 0 0 0  Altered sleeping - - - - -  Tired, decreased energy - - - - -  Change in appetite - - - - -  Feeling bad or failure about yourself  - - - - -  Trouble concentrating - - - - -  Moving slowly or fidgety/restless - - - - -  Suicidal thoughts - - - - -  PHQ-9 Score - - - - -  Difficult doing work/chores - - - - -    Fall Risk: Fall Risk  12/31/2016 12/21/2016 06/18/2016 05/16/2016  02/22/2016  Falls in the past  year? No No No No No     Functional Status Survey: Is the patient deaf or have difficulty hearing?: No Does the patient have difficulty seeing, even when wearing glasses/contacts?: No Does the patient have difficulty concentrating, remembering, or making decisions?: No Does the patient have difficulty walking or climbing stairs?: No Does the patient have difficulty dressing or bathing?: No Does the patient have difficulty doing errands alone such as visiting a doctor's office or shopping?: No    Assessment & Plan  1. Right upper quadrant pain  Doing better, using MIralax, still very weak and not disimpacted yet, we will extend time off until Next Monday, advised to contact GI to discuss next steps  2. Constipation, unspecified constipation type  Still taking Miralax but no formed stools yet  3. Anal irritation  - nystatin-triamcinolone ointment (MYCOLOG); Apply 1 application 2 (two) times daily topically. Mix at home with A&D ointment apply with each bowel movement  Dispense: 60 g; Refill: 0

## 2017-01-01 ENCOUNTER — Telehealth: Payer: Self-pay | Admitting: Gastroenterology

## 2017-01-01 NOTE — Telephone Encounter (Signed)
Patient left a voice message that her PCP wanted her to call us and say that she is still having liquid bowel movements. Please call patient to advise her what to do

## 2017-01-02 ENCOUNTER — Telehealth: Payer: Self-pay | Admitting: Gastroenterology

## 2017-01-02 ENCOUNTER — Ambulatory Visit: Payer: BLUE CROSS/BLUE SHIELD | Admitting: Gastroenterology

## 2017-01-02 NOTE — Telephone Encounter (Signed)
Patient called again

## 2017-01-04 NOTE — Telephone Encounter (Signed)
Pt scheduled for a follow up appt Monday.

## 2017-01-07 ENCOUNTER — Ambulatory Visit: Payer: BLUE CROSS/BLUE SHIELD | Admitting: Gastroenterology

## 2017-01-07 ENCOUNTER — Encounter: Payer: Self-pay | Admitting: Gastroenterology

## 2017-01-07 VITALS — BP 100/63 | HR 77 | Temp 98.0°F | Ht 61.0 in | Wt 131.6 lb

## 2017-01-07 DIAGNOSIS — R1011 Right upper quadrant pain: Secondary | ICD-10-CM

## 2017-01-07 DIAGNOSIS — K5909 Other constipation: Secondary | ICD-10-CM | POA: Diagnosis not present

## 2017-01-07 DIAGNOSIS — R194 Change in bowel habit: Secondary | ICD-10-CM | POA: Diagnosis not present

## 2017-01-07 NOTE — Patient Instructions (Addendum)
Please use Metamucil daily as directed.   Please contact our office with any questions or concerns.

## 2017-01-07 NOTE — Progress Notes (Signed)
Claire Antigua, MD [address], [place], [place], [name], [address] [address], [place], [place], [name], [address] Phone: [phone]  Fax: [phone]  Consultation  Referring Provider:     Steele Sizer, MD Primary Care Physician:  Steele Sizer, MD Primary Gastroenterologist:  Virgel Manifold, MD        Reason for Consultation:    Constipation/Diarrhea  Date of Consultation:  01/07/2017         HPI:   Claire [name] is a 46 y.o. female who presents for follow-up for altered bowel habits.  Patient was seen recently on November 6 at which time she complained of right upper quadrant pain and constipation.   her symptoms are chronic and have had extensive evaluation with normal.  Last time she stated that the only thing that helps her feel better was a colonoscopy prep.  Patient was asked to start taking MiraLAX every day as constipation was likely leading to her abdominal pain.  She states now her pain is much better and she is eating better as well.  However, she was to be under the impression that her stool should look like soft mashed potato consistency but she still having loose watery output with her bowel movements at times.  She would like to know what to do about that.  No blood in her stool.  Vitals reviewed  Past Medical History:  Diagnosis Date  . Anemia   . Anxiety   . Asthma   . Bilateral leg cramps   . Depression   . Diabetes mellitus without complication ([place])    history no longer a problem since weight loss surgery  . DJD (degenerative joint disease)   . GERD (gastroesophageal reflux disease)   . Headache    Migraines  . IBS (irritable bowel syndrome)   . Kidney stone   . Lung nodules   . Muscle spasm   . Psoriasis    vaginal area  . Seasonal allergies   . Sleep apnea    history no longer a problem since weight loss surgery  . Vitamin D deficiency     Past Surgical History:  Procedure Laterality Date  . BLADDER SURGERY  2010    . BREAST BIOPSY Right 2014   stereotatic biopsy  . CHOLECYSTECTOMY  2010  . COLONOSCOPY  2014    Done at [facility]  . DILATATION & CURETTAGE/HYSTEROSCOPY WITH NOVASURE ABLATION N/A 06/16/2015   Performed by Brien Few, MD at [facility] ORS  . [place] RESECTION  2012  . LAPAROSCOPY DIAGNOSTIC N/A 06/16/2015   Performed by Brien Few, MD at [facility] ORS  . LYSIS OF ADHESION N/A 06/16/2015   Performed by Brien Few, MD at [facility] ORS  . PLANTAR FASCIA SURGERY Bilateral   . ROBOTIC ASSISTED SALPINGO OOPHORECTOMY, EXCISION OF RIGHT CUL DE [facility] MASS Right 06/16/2015   Performed by Brien Few, MD at [facility] ORS  . TONSILLECTOMY    . UPPER GI ENDOSCOPY      Prior to Admission medications   Medication Sig Start Date End Date Taking? Authorizing Provider  ALPRAZolam Duanne Moron) 0.5 MG tablet Take 1 tablet (0.5 mg total) by mouth at bedtime as needed for anxiety. 08/17/16  Yes Sowles, Drue Stager, MD  Ascorbic Acid (VITAMIN C) 1000 MG tablet Take 1,000 mg by mouth.   Yes [provider]  calcium carbonate (OS-CAL) 600 MG TABS tablet Take 600 mg by mouth daily with breakfast.   Yes [provider]  clobetasol ointment (  TEMOVATE) 0.05 %  06/06/15  Yes [provider]  cyclobenzaprine (FLEXERIL) 10 MG tablet Take 1 tablet (10 mg total) by mouth at bedtime. Prn 11/11/15  Yes Sowles, Drue Stager, MD  ferrous sulfate 325 (65 FE) MG EC tablet Take 325 mg by mouth daily with breakfast.    Yes [provider]  lansoprazole (PREVACID) 30 MG capsule Take 1 capsule (30 mg total) by mouth daily. 11/19/16  Yes Sowles, Drue Stager, MD  loratadine (CLARITIN) 10 MG tablet Take 10 mg by mouth daily.   Yes [provider]  LYRICA 50 MG capsule Take 1 capsule by mouth daily. 04/05/16  Yes [provider]  meclizine (ANTIVERT) 25 MG tablet TK 1 T PO Q 8 H PRF VERTIGO 12/03/16  Yes [provider]  mometasone (NASONEX) 50 MCG/ACT nasal spray USE 2 SPRAYS IN EACH  NOSTRIL AT BEDTIME 05/02/15  Yes Sowles, Drue Stager, MD  montelukast (SINGULAIR) 10 MG tablet TAKE 1 TABLET(10 MG) BY MOUTH AT BEDTIME 11/18/16  Yes Sowles, Drue Stager, MD  nystatin-triamcinolone ointment (MYCOLOG) Apply 1 application 2 (two) times daily topically. Mix at home with A&D ointment apply with each bowel movement 12/31/16  Yes Sowles, Drue Stager, MD  ondansetron (ZOFRAN) 4 MG tablet Take 1 tablet (4 mg total) by mouth every 8 (eight) hours as needed for nausea or vomiting. 12/17/16  Yes Schuyler Amor, MD  OZEMPIC 1 MG/DOSE SOPN INJECT 1 MG INTO THE SKIN ONCE A WEEK. 12/30/16  Yes Sowles, Drue Stager, MD  Potassium 99 MG TABS Take by mouth.   Yes [provider]  Potassium Aminobenzoate 500 MG CAPS Take 1 capsule by mouth daily.   Yes [provider]  promethazine (PHENERGAN) 12.5 MG tablet TAKE 1 TABLET BY MOUTH EVERY 6 HOURS AS NEEDED FOR MIGRAINES WITH NAUSEA 11/19/16  Yes Sowles, Drue Stager, MD  SUMAtriptan (IMITREX) 100 MG tablet TAKE 1 TABLET AS NEEDED FOR MIGRAINE EPISODE, NO MORE THAN 2 IN 24 HOURS 02/11/16  Yes Sowles, Drue Stager, MD  VENTOLIN HFA 108 (90 Base) MCG/ACT inhaler USE 2 INHALATIONS EVERY 6 HOURS AS NEEDED FOR WHEEZING OR SHORTNESS OF BREATH 02/11/16  Yes Sowles, Drue Stager, MD  vitamin B-12 (CYANOCOBALAMIN) 100 MCG tablet Take 100 mcg by mouth as directed.    Yes [provider]  Vitamin D, Ergocalciferol, (DRISDOL) 50000 units CAPS capsule TAKE 1 CAPSULE EVERY 7 DAYS 11/05/16  Yes Sowles, Drue Stager, MD  zolpidem (AMBIEN) 10 MG tablet Must be [employer] 11/19/16  Yes Sowles, Drue Stager, MD  zonisamide (ZONEGRAN) 50 MG capsule Take 3 capsules by mouth daily. 02/10/16  Yes Orie Rout, MD    Family History  Problem Relation Age of Onset  . Heart attack Mother   . Stroke Mother   . Multiple myeloma Mother   . Hyperlipidemia Father   . Heart attack Sister   . Heart attack Maternal Uncle   . Heart attack Paternal Grandfather   . Breast cancer Paternal  Aunt   . Breast cancer Paternal Grandmother   . Lung cancer Maternal Grandfather      Social History   Tobacco Use  . Smoking status: Former Smoker    Packs/day: 1.00    Years: 10.00    Pack years: 10.00    Types: Cigarettes    Last attempt to quit: 02/19/1993    Years since quitting: 23.8  . Smokeless tobacco: Never Used  Substance Use Topics  . Alcohol use: Yes    Alcohol/week: 0.0 oz    Comment: occasionally  .  Drug use: No    Allergies as of 01/07/2017 - Review Complete 01/07/2017  Allergen Reaction Noted  . Gabapentin  05/16/2016  . Linzess [linaclotide]  11/11/2015  . Other Hives and Other (See Comments) 06/30/2012  . Cymbalta [duloxetine hcl] Palpitations 09/24/2014  . Vicodin [hydrocodone-acetaminophen] Itching 07/07/2012    Review of Systems:    All systems reviewed and negative except where noted in HPI.   Physical Exam:  Vital signs in last 24 hours: Vitals reviewed   General:   Pleasant, cooperative in NAD Head:  Normocephalic and atraumatic. Eyes:   No icterus.   Conjunctiva pink. PERRLA. Ears:  Normal auditory acuity. Neck:  Supple; no masses or thyroidomegaly Lungs: Respirations even and unlabored. Lungs clear to auscultation bilaterally.   No wheezes, crackles, or rhonchi.  Heart:  Regular rate and rhythm;  Without murmur, clicks, rubs or gallops Abdomen:  Soft, nondistended, nontender. Normal bowel sounds. No appreciable masses or hepatomegaly.  No rebound or guarding.  Neurologic:  Alert and oriented x3;  grossly normal neurologically. Skin:  Intact without significant lesions or rashes. Cervical Nodes:  No significant cervical adenopathy. Psych:  Alert and cooperative. Normal affect.  LAB RESULTS: No results for input(s): WBC, HGB, HCT, PLT in the last 72 hours. BMET No results for input(s): NA, K, CL, CO2, GLUCOSE, BUN, CREATININE, CALCIUM in the last 72 hours. LFT No results for input(s): PROT, ALBUMIN, AST, ALT, ALKPHOS, BILITOT, BILIDIR,  IBILI in the last 72 hours. PT/INR No results for input(s): LABPROT, INR in the last 72 hours. CT report reviewed November 2 labs reviewed STUDIES: No results found. CT report reviewed   Impression / Plan:   Claire [name] is a 46 y.o. y/o female with chronic symptoms with constipation and abdominal pain not improved with MiraLAX  Instructed patient to start taking Metamucil at bedtime as her main complaint at this time is watery output with her stools at times.   This will allow herStools to become more bulky. She is overall doing better as far as her pain, appetite and constipation is concerned.  I reassured her about her normal imaging and endoscopy. I discussed that her bones do not have to look a specific way or have a certain specific consistency.  We discussed, that her normal workup is very reassuring that it is a good thing is that she does not have any alarming findings, and the goal is to get to the point of having problems that work with her lifestyle allow her to go about her daily routine. If Metamucil does not work well, can try IB guard  Thank you for involving me in the care of this patient.      Virgel Manifold, MD  01/07/2017, 9:07 AM

## 2017-01-09 ENCOUNTER — Other Ambulatory Visit: Payer: Self-pay | Admitting: Unknown Physician Specialty

## 2017-01-09 DIAGNOSIS — R42 Dizziness and giddiness: Secondary | ICD-10-CM | POA: Diagnosis not present

## 2017-01-09 DIAGNOSIS — H60549 Acute eczematoid otitis externa, unspecified ear: Secondary | ICD-10-CM | POA: Diagnosis not present

## 2017-01-21 DIAGNOSIS — R42 Dizziness and giddiness: Secondary | ICD-10-CM | POA: Diagnosis not present

## 2017-01-23 ENCOUNTER — Ambulatory Visit
Admission: RE | Admit: 2017-01-23 | Discharge: 2017-01-23 | Disposition: A | Discharge status: Home / Self Care | Payer: BLUE CROSS/BLUE SHIELD | Source: Ambulatory Visit | Attending: Unknown Physician Specialty | Admitting: Unknown Physician Specialty

## 2017-01-23 DIAGNOSIS — R42 Dizziness and giddiness: Secondary | ICD-10-CM | POA: Insufficient documentation

## 2017-01-23 DIAGNOSIS — H748X2 Other specified disorders of left middle ear and mastoid: Secondary | ICD-10-CM | POA: Insufficient documentation

## 2017-01-23 MED ORDER — GADOBENATE DIMEGLUMINE 529 MG/ML IV SOLN
15.0000 mL | Freq: Once | INTRAVENOUS | Status: AC | PRN
  Administered 2017-01-23: 15 mL via INTRAVENOUS

## 2017-01-29 ENCOUNTER — Other Ambulatory Visit: Payer: Self-pay | Admitting: Family Medicine

## 2017-01-29 DIAGNOSIS — E559 Vitamin D deficiency, unspecified: Secondary | ICD-10-CM

## 2017-02-20 ENCOUNTER — Ambulatory Visit: Payer: BLUE CROSS/BLUE SHIELD | Admitting: Family Medicine

## 2017-02-20 ENCOUNTER — Encounter: Payer: Self-pay | Admitting: Family Medicine

## 2017-02-20 VITALS — BP 102/66 | HR 94 | Temp 97.8°F | Resp 16 | Ht 61.0 in | Wt 128.5 lb

## 2017-02-20 DIAGNOSIS — J3089 Other allergic rhinitis: Secondary | ICD-10-CM

## 2017-02-20 DIAGNOSIS — M461 Sacroiliitis, not elsewhere classified: Secondary | ICD-10-CM

## 2017-02-20 DIAGNOSIS — J4520 Mild intermittent asthma, uncomplicated: Secondary | ICD-10-CM | POA: Diagnosis not present

## 2017-02-20 DIAGNOSIS — I700 Atherosclerosis of aorta: Secondary | ICD-10-CM

## 2017-02-20 DIAGNOSIS — E119 Type 2 diabetes mellitus without complications: Secondary | ICD-10-CM | POA: Diagnosis not present

## 2017-02-20 DIAGNOSIS — G43109 Migraine with aura, not intractable, without status migrainosus: Secondary | ICD-10-CM | POA: Diagnosis not present

## 2017-02-20 DIAGNOSIS — F5104 Psychophysiologic insomnia: Secondary | ICD-10-CM | POA: Diagnosis not present

## 2017-02-20 DIAGNOSIS — F418 Other specified anxiety disorders: Secondary | ICD-10-CM | POA: Diagnosis not present

## 2017-02-20 LAB — POCT GLYCOSYLATED HEMOGLOBIN (HGB A1C): Hemoglobin A1C: 4.7

## 2017-02-20 MED ORDER — ATORVASTATIN CALCIUM 10 MG PO TABS: 10.0000 mg | ORAL_TABLET | Freq: Every day | ORAL | 3 refills | Status: DC

## 2017-02-20 MED ORDER — PROMETHAZINE HCL 12.5 MG PO TABS: ORAL_TABLET | ORAL | 0 refills | Status: DC

## 2017-02-20 MED ORDER — ZOLPIDEM TARTRATE 10 MG PO TABS: ORAL_TABLET | ORAL | 0 refills | Status: DC

## 2017-02-20 MED ORDER — MOMETASONE FUROATE 50 MCG/ACT NA SUSP: NASAL | 1 refills | Status: DC

## 2017-02-20 MED ORDER — ALPRAZOLAM 0.5 MG PO TABS: 0.5000 mg | ORAL_TABLET | Freq: Every evening | ORAL | 0 refills | Status: DC | PRN

## 2017-02-20 MED ORDER — ALBUTEROL SULFATE HFA 108 (90 BASE) MCG/ACT IN AERS: INHALATION_SPRAY | RESPIRATORY_TRACT | 0 refills | Status: DC

## 2017-02-20 NOTE — Progress Notes (Signed)
Name: Claire [name]   MRN: [id number]    DOB: December 02, 1970   Date:02/20/2017       Progress Note  Subjective  Chief Complaint  Chief Complaint  Patient presents with  . Medication Refill  . Diabetes    Has not had any test strips at home  . Depression    Stable  . Insomnia  . Migraine    Sees Dr. Domingo Cocking  . Gastroesophageal Reflux    HPI  Depression/Anxiety: she stopped medication months ago - sSRI, medications was making numb. She is feeling well,still has stress but able to cope with better, still takes alprazolam prn, 15 pills lasts about 6 months.   Insomnia: taking medication occasionally wakes up during the night, but not every night.  Still taking Ambien, unable to go down on dose of Ambien. She states her husband is snoring more and is having difficulty staying asleep.   Asthma Mild Intermittentt: she is doing well now, no cough, wheezing or SOB, still taking Singulair daily, uses Ventolin only prn   Migraine: she is still seeing Dr. Domingo Cocking - now off Topamax and is on Zonegran, and is doing well at this time, no pain at this time, however she is having new symptoms, headache on nuchal area, and over the past month she had 4 episodes of dizziness ( vertigo), episodes of nystagmus on both eyes, blurred vision, bilateral tinnitus, she is not sure if she has hearing loss. Dizziness is associated with nausea but no vomiting. She had MRI done but not a good study because of her braces. Mild fluid on the left astoid   DMII: diagnosed at age 47, took medication for a while, but stopped all medications 10/25/2010 after bariatric surgery, she has not been checking glucose at home,  no polyphagia, polyuria or polydipsia. Maximum weight of 265 lbs and is down to 128.5 Lbs. lbs .She wanted to lose more weight so we started Ozempic back in March 2018, total loss since started on Ozempic was: 36 lbs.   GERD: improving again, treated for gastritis, lost weight, but back to normal now,  she also has hiatal hernia, based on recent EGD, she is taking PPI once a day and advised to only take H2 blocker prn if needed   Constipation and abdominal pain: seen by GI and is on metamucil and miralax still has abdominal pain but is better  Back pain: she states back pain got worse with tingling and numbness on both legs now, seen by [facility], and is seeing Dr. Dorcas Mcmurray, she had NCS/EMG by Dr. Domingo Cocking and it was normal, Dr. Dorcas Mcmurray did NCS/EMG on upper extremity and she has carpal tunnel bilaterally. No bowel or bladder incontinence. She is now on Lyrica ,pain at this time 4/10 , she also has pain on right sacro-iliac joint - chronic    Patient Active Problem List   Diagnosis Date Noted  . Atherosclerosis of aorta ([place]) 12/21/2016  . Kidney stones 12/21/2016  . 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 Esophageal hiatal hernia 08/17/2016  . Diverticulosis of colon 08/17/2016  . Iron deficiency anemia 08/08/2015  . Lumbosacral pain 01/07/2015  . Muscle twitching 01/07/2015  . Intertrigo 11/05/2014  . Perennial allergic rhinitis 09/03/2014  . Lung nodules 09/03/2014  . Vitamin D deficiency 09/03/2014  . Diet-controlled diabetes mellitus ([place]) 09/03/2014  . History of kidney stones 09/03/2014  . Chronic insomnia 09/03/2014  . History of iron deficiency anemia 09/03/2014  . Depression with anxiety 09/03/2014  . Palpitations  06/22/2014  . History of bariatric surgery 12/03/2013  . Asthma, mild persistent 11/21/2012  . CN (constipation) 11/21/2012  . GERD without esophagitis 11/21/2012  . History of hyperlipidemia 11/21/2012  . Migraine with aura and without status migrainosus 11/21/2012  . History of sleep apnea 11/21/2012  . IBS (irritable bowel syndrome) 08/15/2012    Past Surgical History:  Procedure Laterality Date  . BLADDER SURGERY  2010  . BREAST BIOPSY Right 2014   stereotatic biopsy  . CHOLECYSTECTOMY  2010  . COLONOSCOPY  2014    Done at [facility]  .  DILITATION & CURRETTAGE/HYSTROSCOPY WITH NOVASURE ABLATION N/A 06/16/2015   Procedure: DILATATION & CURETTAGE/HYSTEROSCOPY WITH NOVASURE ABLATION;  Surgeon: Brien Few, MD;  Location: [facility];  Service: Gynecology;  Laterality: N/A;  . Alpha RESECTION  2012  . LAPAROSCOPY N/A 06/16/2015   Procedure: LAPAROSCOPY DIAGNOSTIC;  Surgeon: Brien Few, MD;  Location: [facility];  Service: Gynecology;  Laterality: N/A;  . LYSIS OF ADHESION N/A 06/16/2015   Procedure: LYSIS OF ADHESION;  Surgeon: Brien Few, MD;  Location: [facility];  Service: Gynecology;  Laterality: N/A;  . PLANTAR FASCIA SURGERY Bilateral   . ROBOTIC ASSISTED SALPINGO OOPHERECTOMY Right 06/16/2015   Procedure: ROBOTIC ASSISTED SALPINGO OOPHORECTOMY, EXCISION OF RIGHT CUL DE [place] MASS;  Surgeon: Brien Few, MD;  Location: [facility];  Service: Gynecology;  Laterality: Right;  . TONSILLECTOMY    . UPPER GI ENDOSCOPY      Family History  Problem Relation Age of Onset  . Heart attack Mother   . Stroke Mother   . Multiple myeloma Mother   . Hyperlipidemia Father   . Heart attack Sister   . Heart attack Maternal Uncle   . Heart attack Paternal Grandfather   . Breast cancer Paternal Aunt   . Breast cancer Paternal Grandmother   . Lung cancer Maternal Grandfather     Social History   Socioeconomic History  . Marital status: Married    Spouse name: Not on file  . Number of children: Not on file  . Years of education: Not on file  . Highest education level: Not on file  Social Needs  . Financial resource strain: Not on file  . Food insecurity - worry: Not on file  . Food insecurity - inability: Not on file  . Transportation needs - medical: Not on file  . Transportation needs - non-medical: Not on file  Occupational History  . Not on file  Tobacco Use  . Smoking status: Former Smoker    Packs/day: 1.00    Years: 10.00    Pack years: 10.00    Types: Cigarettes    Last attempt to quit: 02/19/1993     Years since quitting: 24.0  . Smokeless tobacco: Never Used  Substance and Sexual Activity  . Alcohol use: Yes    Alcohol/week: 0.0 oz    Comment: occasionally  . Drug use: No  . Sexual activity: Yes    Partners: Male  Other Topics Concern  . Not on file  Social History Narrative  . Not on file     Current Outpatient Medications:  .  ALPRAZolam (XANAX) 0.5 MG tablet, Take 1 tablet (0.5 mg total) by mouth at bedtime as needed for anxiety., Disp: 15 tablet, Rfl: 0 .  Ascorbic Acid (VITAMIN C) 1000 MG tablet, Take 1,000 mg by mouth., Disp: , Rfl:  .  calcium carbonate (OS-CAL) 600 MG TABS tablet, Take 600 mg by mouth daily with breakfast., Disp: ,  Rfl:  .  clobetasol ointment (TEMOVATE) 0.05 %, , Disp: , Rfl: 0 .  cyclobenzaprine (FLEXERIL) 10 MG tablet, Take 1 tablet (10 mg total) by mouth at bedtime. Prn, Disp: 90 tablet, Rfl: 1 .  ferrous sulfate 325 (65 FE) MG EC tablet, Take 325 mg by mouth daily with breakfast. , Disp: , Rfl:  .  gentamicin ointment (GARAMYCIN) 0.1 %, APP IN NOSE TID PRF NASAL DRYNESS UTD, Disp: , Rfl: 0 .  lansoprazole (PREVACID) 30 MG capsule, Take 1 capsule (30 mg total) by mouth daily., Disp: 90 capsule, Rfl: 1 .  loratadine (CLARITIN) 10 MG tablet, Take 10 mg by mouth daily., Disp: , Rfl:  .  LYRICA 50 MG capsule, Take 1 capsule by mouth daily., Disp: , Rfl:  .  meclizine (ANTIVERT) 25 MG tablet, TK 1 T PO Q 8 H PRF VERTIGO, Disp: , Rfl: 10 .  mometasone (ELOCON) 0.1 % lotion, APP TO ITCHY EARS BID FOR 12 DAYS. MAY REPEAT PRF ITCHY EARS, Disp: , Rfl: 4 .  mometasone (NASONEX) 50 MCG/ACT nasal spray, USE 2 SPRAYS IN EACH NOSTRIL AT BEDTIME, Disp: 51 g, Rfl: 1 .  montelukast (SINGULAIR) 10 MG tablet, TAKE 1 TABLET(10 MG) BY MOUTH AT BEDTIME, Disp: 90 tablet, Rfl: 1 .  nystatin-triamcinolone ointment (MYCOLOG), Apply 1 application 2 (two) times daily topically. Mix at home with A&D ointment apply with each bowel movement, Disp: 60 g, Rfl: 0 .  OZEMPIC 1 MG/DOSE  SOPN, INJECT 1 MG INTO THE SKIN ONCE A WEEK., Disp: 9 mL, Rfl: 0 .  Potassium 99 MG TABS, Take by mouth., Disp: , Rfl:  .  promethazine (PHENERGAN) 12.5 MG tablet, TAKE 1 TABLET BY MOUTH EVERY 6 HOURS AS NEEDED FOR MIGRAINES WITH NAUSEA, Disp: 20 tablet, Rfl: 0 .  SUMAtriptan (IMITREX) 100 MG tablet, TAKE 1 TABLET AS NEEDED FOR MIGRAINE EPISODE, NO MORE THAN 2 IN 24 HOURS, Disp: 18 tablet, Rfl: 1 .  VENTOLIN HFA 108 (90 Base) MCG/ACT inhaler, USE 2 INHALATIONS EVERY 6 HOURS AS NEEDED FOR WHEEZING OR SHORTNESS OF BREATH, Disp: 54 g, Rfl: 0 .  vitamin B-12 (CYANOCOBALAMIN) 100 MCG tablet, Take 100 mcg by mouth as directed. , Disp: , Rfl:  .  Vitamin D, Ergocalciferol, (DRISDOL) 50000 units CAPS capsule, TAKE 1 CAPSULE EVERY 7 DAYS, Disp: 12 capsule, Rfl: 0 .  zolpidem (AMBIEN) 10 MG tablet, Must be [employer], Disp: 90 tablet, Rfl: 0 .  zonisamide (ZONEGRAN) 50 MG capsule, Take 3 capsules by mouth daily., Disp: , Rfl: 2  Allergies  Allergen Reactions  . Gabapentin     Bladder incontinence at higher dose  . Linzess [Linaclotide]     Worsening of diarrhea  . Other Hives and Other (See Comments)    Ricotta Cheese: flushed and hot Ricotta Cheese: flushed and hot Ricotta Cheese: flushed and hot  . Cymbalta [Duloxetine Hcl] Palpitations    And photosensitivity  . Vicodin [Hydrocodone-Acetaminophen] Itching     ROS  Constitutional: Negative for fever positive for mild  weight change.  Respiratory: Negative for cough and shortness of breath.   Cardiovascular: Negative for chest pain or palpitations.  Gastrointestinal: positive  for intermittent abdominal pain, no bowel changes.  Musculoskeletal: Negative for gait problem or joint swelling.  Skin: Negative for rash.  Neurological: Positive for intermittent dizziness and headache.  No other specific complaints in a complete review of systems (except as listed in HPI above).  Objective  Vitals:   02/20/17 1428  BP:  102/66  Pulse: 94   Resp: 16  Temp: 97.8 F (36.6 C)  TempSrc: Oral  SpO2: 98%  Weight: 128 lb 8 oz (58.3 kg)  Height: 5' 1"  (1.549 m)    Body mass index is 24.28 kg/m.  Physical Exam  Constitutional: Patient appears well-developed and well-nourished. Obese  No distress.  HEENT: head atraumatic, normocephalic, pupils equal and reactive to light,  neck supple, throat within normal limits Cardiovascular: Normal rate, regular rhythm and normal heart sounds.  No murmur heard. No BLE edema. Pulmonary/Chest: Effort normal and breath sounds normal. No respiratory distress. Abdominal: Soft.  There is mild epigastric  tenderness. Psychiatric: Patient has a normal mood and affect. behavior is normal. Judgment and thought content normal.  Recent Results (from the past 2160 hour(s))  Lipase, blood     Status: None   Collection Time: 12/17/16  1:55 PM  Result Value Ref Range   Lipase 31 11 - 51 U/L  Comprehensive metabolic panel     Status: Abnormal   Collection Time: 12/17/16  1:55 PM  Result Value Ref Range   Sodium 137 135 - 145 mmol/L   Potassium 3.2 (L) 3.5 - 5.1 mmol/L   Chloride 111 101 - 111 mmol/L   CO2 22 22 - 32 mmol/L   Glucose, Bld 102 (H) 65 - 99 mg/dL   BUN 7 6 - 20 mg/dL   Creatinine, Ser 0.69 0.44 - 1.00 mg/dL   Calcium 8.8 (L) 8.9 - 10.3 mg/dL   Total Protein 7.2 6.5 - 8.1 g/dL   Albumin 4.0 3.5 - 5.0 g/dL   AST 17 15 - 41 U/L   ALT 16 14 - 54 U/L   Alkaline Phosphatase 57 38 - 126 U/L   Total Bilirubin 0.6 0.3 - 1.2 mg/dL   GFR calc non Af Amer >60 >60 mL/min   GFR calc Af Amer >60 >60 mL/min    Comment: (NOTE) The eGFR has been calculated using the CKD EPI equation. This calculation has not been validated in all clinical situations. eGFR's persistently <60 mL/min signify possible Chronic Kidney Disease.    Anion gap 4 (L) 5 - 15  CBC     Status: None   Collection Time: 12/17/16  1:55 PM  Result Value Ref Range   WBC 9.7 3.6 - 11.0 K/uL   RBC 4.67 3.80 - 5.20 MIL/uL    Hemoglobin 14.0 12.0 - 16.0 g/dL   HCT 41.6 35.0 - 47.0 %   MCV 89.0 80.0 - 100.0 fL   MCH 30.0 26.0 - 34.0 pg   MCHC 33.7 32.0 - 36.0 g/dL   RDW 13.9 11.5 - 14.5 %   Platelets 250 150 - 440 K/uL  Urinalysis, Complete w Microscopic     Status: Abnormal   Collection Time: 12/17/16  1:57 PM  Result Value Ref Range   Color, Urine AMBER (A) YELLOW    Comment: BIOCHEMICALS MAY BE AFFECTED BY COLOR   APPearance CLOUDY (A) CLEAR   Specific Gravity, Urine 1.026 1.005 - 1.030   pH 5.0 5.0 - 8.0   Glucose, UA NEGATIVE NEGATIVE mg/dL   Hgb urine dipstick NEGATIVE NEGATIVE   Bilirubin Urine NEGATIVE NEGATIVE   Ketones, ur 80 (A) NEGATIVE mg/dL   Protein, ur 30 (A) NEGATIVE mg/dL   Nitrite NEGATIVE NEGATIVE   Leukocytes, UA SMALL (A) NEGATIVE   RBC / HPF 6-30 0 - 5 RBC/hpf   WBC, UA 6-30 0 - 5 WBC/hpf   Bacteria, UA NONE SEEN  NONE SEEN   Squamous Epithelial / LPF 6-30 (A) NONE SEEN   Mucus PRESENT    Hyaline Casts, UA PRESENT   Gastrointestinal Panel by PCR , Stool     Status: None   Collection Time: 12/17/16  4:48 PM  Result Value Ref Range   Campylobacter species NOT DETECTED NOT DETECTED   Plesimonas shigelloides NOT DETECTED NOT DETECTED   Salmonella species NOT DETECTED NOT DETECTED   Yersinia enterocolitica NOT DETECTED NOT DETECTED   Vibrio species NOT DETECTED NOT DETECTED   Vibrio cholerae NOT DETECTED NOT DETECTED   Enteroaggregative E coli (EAEC) NOT DETECTED NOT DETECTED   Enteropathogenic E coli (EPEC) NOT DETECTED NOT DETECTED   Enterotoxigenic E coli (ETEC) NOT DETECTED NOT DETECTED   Shiga like toxin producing E coli (STEC) NOT DETECTED NOT DETECTED   Shigella/Enteroinvasive E coli (EIEC) NOT DETECTED NOT DETECTED   Cryptosporidium NOT DETECTED NOT DETECTED   Cyclospora cayetanensis NOT DETECTED NOT DETECTED   Entamoeba histolytica NOT DETECTED NOT DETECTED   Giardia lamblia NOT DETECTED NOT DETECTED   Adenovirus F40/41 NOT DETECTED NOT DETECTED   Astrovirus NOT  DETECTED NOT DETECTED   Norovirus GI/GII NOT DETECTED NOT DETECTED   Rotavirus A NOT DETECTED NOT DETECTED   Sapovirus (I, II, IV, and V) NOT DETECTED NOT DETECTED  C difficile quick scan w PCR reflex     Status: None   Collection Time: 12/17/16  4:48 PM  Result Value Ref Range   C Diff antigen NEGATIVE NEGATIVE   C Diff toxin NEGATIVE NEGATIVE   C Diff interpretation No C. difficile detected.   H. pylori breath test     Status: None   Collection Time: 12/21/16  9:06 AM  Result Value Ref Range   H. pylori Breath Test NOT DETECTED NOT DETECT    Comment: . Antimicrobials, proton pump inhibitors, and bismuth preparations are known to suppress H. pylori, and  ingestion of these prior to H. pylori diagnostic testing may lead to false negative results. If clinically  indicated, the test may be repeated on a new specimen obtained two weeks after discontinuing treatment. However, a positive result is still clinically valid.   Potassium     Status: None   Collection Time: 12/21/16  9:06 AM  Result Value Ref Range   Potassium 3.9 3.5 - 5.3 mmol/L  Magnesium     Status: None   Collection Time: 12/21/16  9:06 AM  Result Value Ref Range   Magnesium 1.8 1.5 - 2.5 mg/dL     PHQ2/9: Depression screen [facility] 2/9 02/20/2017 06/18/2016 05/16/2016 02/22/2016 11/11/2015  Decreased Interest 0 0 0 0 0  Down, Depressed, Hopeless 0 0 0 0 0  PHQ - 2 Score 0 0 0 0 0  Altered sleeping - - - - -  Tired, decreased energy - - - - -  Change in appetite - - - - -  Feeling bad or failure about yourself  - - - - -  Trouble concentrating - - - - -  Moving slowly or fidgety/restless - - - - -  Suicidal thoughts - - - - -  PHQ-9 Score - - - - -  Difficult doing work/chores - - - - -     Fall Risk: Fall Risk  02/20/2017 12/31/2016 12/21/2016 06/18/2016 05/16/2016  Falls in the past year? No No No No No     Functional Status Survey: Is the patient deaf or have difficulty hearing?: No Does the patient have  difficulty seeing, even when wearing glasses/contacts?: No Does the patient have difficulty concentrating, remembering, or making decisions?: No Does the patient have difficulty walking or climbing stairs?: No Does the patient have difficulty dressing or bathing?: No Does the patient have difficulty doing errands alone such as visiting a doctor's office or shopping?: No    Assessment & Plan  1. Diet-controlled diabetes mellitus ([place])  - POCT HgB A1C  2. Depression with anxiety  - ALPRAZolam (XANAX) 0.5 MG tablet; Take 1 tablet (0.5 mg total) by mouth at bedtime as needed for anxiety.  Dispense: 15 tablet; Refill: 0  3. Migraine with aura and without status migrainosus, not intractable  - promethazine (PHENERGAN) 12.5 MG tablet; TAKE 1 TABLET BY MOUTH EVERY 6 HOURS AS NEEDED FOR MIGRAINES WITH NAUSEA  Dispense: 20 tablet; Refill: 0  4. Chronic insomnia  - zolpidem (AMBIEN) 10 MG tablet; Must be [employer]  Dispense: 90 tablet; Refill: 0  5. Mild intermittent asthma without complication  - albuterol (VENTOLIN HFA) 108 (90 Base) MCG/ACT inhaler; USE 2 INHALATIONS EVERY 6 HOURS AS NEEDED FOR WHEEZING OR SHORTNESS OF BREATH  Dispense: 54 g; Refill: 0  6. Perennial allergic rhinitis  - mometasone (NASONEX) 50 MCG/ACT nasal spray; USE 2 SPRAYS IN EACH NOSTRIL AT BEDTIME  Dispense: 51 g; Refill: 1  7. 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 Atherosclerosis of aorta ([place])  She used to be morbidly obese, discussed aspirin and statin therapy, she would like to try it

## 2017-03-08 ENCOUNTER — Telehealth: Payer: Self-pay

## 2017-03-08 NOTE — Telephone Encounter (Signed)
Copied from [place] [id number]. Topic: Bill or Statement - Patient/Guarantor Inquiry >> Mar 08, 2017  9:49 AM Synthia Innocent wrote: Patient name/MRN/Acct #: Claire [name] DOS: 11/19/2016 Details of issue or inquiry: patient states Vit D test need to be resubmitted to insurance with a medical necessity listed. Insurance will not cover without it.   Route to appropriate [place] or [facility] Coding pool.

## 2017-03-11 NOTE — Telephone Encounter (Signed)
The letter needs to states that she has a history of vitamin D deficiency and bariatric surgery, we have to monitor level yearly

## 2017-03-13 NOTE — Telephone Encounter (Signed)
Left a message to notify patient her letter is ready for pick up.

## 2017-04-09 DIAGNOSIS — R51 Headache: Secondary | ICD-10-CM | POA: Diagnosis not present

## 2017-04-09 DIAGNOSIS — G518 Other disorders of facial nerve: Secondary | ICD-10-CM | POA: Diagnosis not present

## 2017-04-09 DIAGNOSIS — G43019 Migraine without aura, intractable, without status migrainosus: Secondary | ICD-10-CM | POA: Diagnosis not present

## 2017-04-09 DIAGNOSIS — M542 Cervicalgia: Secondary | ICD-10-CM | POA: Diagnosis not present

## 2017-04-09 DIAGNOSIS — M791 Myalgia, unspecified site: Secondary | ICD-10-CM | POA: Diagnosis not present

## 2017-04-09 DIAGNOSIS — G43839 Menstrual migraine, intractable, without status migrainosus: Secondary | ICD-10-CM | POA: Diagnosis not present

## 2017-04-23 ENCOUNTER — Other Ambulatory Visit: Payer: Self-pay | Admitting: Family Medicine

## 2017-04-23 DIAGNOSIS — E559 Vitamin D deficiency, unspecified: Secondary | ICD-10-CM

## 2017-04-23 NOTE — Telephone Encounter (Signed)
Refill request for general medication: Vitamin D 50,000 units  Last office visit: 02/20/2017  Last physical exam: None indicated  Follow-up on file. 05/22/2017

## 2017-04-25 ENCOUNTER — Telehealth: Payer: Self-pay | Admitting: Family Medicine

## 2017-04-25 MED ORDER — FLUCONAZOLE 150 MG PO TABS: 150.0000 mg | ORAL_TABLET | ORAL | 0 refills | Status: DC

## 2017-04-25 NOTE — Telephone Encounter (Signed)
Sent to [place]

## 2017-04-25 NOTE — Telephone Encounter (Signed)
Copied from [place] [id number]. Topic: Quick Communication - Rx Refill/Question >> Apr 25, 2017 11:51 AM Oliver Pila B wrote: Medication: fluconazole (DIFLUCAN) 150 MG tablet [[id number]] DISCONTINUED    Preferred Pharmacy (with phone number or street name): [facility]   Agent: Please be advised that RX refills may take up to 3 business days. We ask that you follow-up with your pharmacy.

## 2017-05-08 ENCOUNTER — Other Ambulatory Visit: Payer: Self-pay | Admitting: Family Medicine

## 2017-05-08 DIAGNOSIS — K219 Gastro-esophageal reflux disease without esophagitis: Secondary | ICD-10-CM

## 2017-05-08 NOTE — Telephone Encounter (Signed)
Refill request for general medication: Lansoprazole 30 mg  Last office visit: 02/20/2017  Last physical exam: None indicated  Follow-up on file. 05/22/2017

## 2017-05-17 ENCOUNTER — Other Ambulatory Visit: Payer: Self-pay | Admitting: Family Medicine

## 2017-05-17 DIAGNOSIS — J4530 Mild persistent asthma, uncomplicated: Secondary | ICD-10-CM

## 2017-05-17 NOTE — Telephone Encounter (Signed)
Refill request for general medication: Singulair 10 mg  Last office visit: 02/20/2017  Last physical exam: None indicated  Follow-ups on file. 05/22/2017

## 2017-05-18 ENCOUNTER — Other Ambulatory Visit: Payer: Self-pay | Admitting: Family Medicine

## 2017-05-18 DIAGNOSIS — J4520 Mild intermittent asthma, uncomplicated: Secondary | ICD-10-CM

## 2017-05-22 ENCOUNTER — Ambulatory Visit: Payer: BLUE CROSS/BLUE SHIELD | Admitting: Family Medicine

## 2017-05-22 ENCOUNTER — Encounter: Payer: Self-pay | Admitting: Family Medicine

## 2017-05-22 VITALS — BP 108/80 | HR 82 | Resp 14 | Ht 61.0 in | Wt 120.1 lb

## 2017-05-22 DIAGNOSIS — F5104 Psychophysiologic insomnia: Secondary | ICD-10-CM

## 2017-05-22 DIAGNOSIS — F325 Major depressive disorder, single episode, in full remission: Secondary | ICD-10-CM

## 2017-05-22 DIAGNOSIS — G43109 Migraine with aura, not intractable, without status migrainosus: Secondary | ICD-10-CM | POA: Diagnosis not present

## 2017-05-22 DIAGNOSIS — J4520 Mild intermittent asthma, uncomplicated: Secondary | ICD-10-CM | POA: Diagnosis not present

## 2017-05-22 DIAGNOSIS — I700 Atherosclerosis of aorta: Secondary | ICD-10-CM | POA: Diagnosis not present

## 2017-05-22 DIAGNOSIS — E119 Type 2 diabetes mellitus without complications: Secondary | ICD-10-CM

## 2017-05-22 DIAGNOSIS — Z9884 Bariatric surgery status: Secondary | ICD-10-CM | POA: Diagnosis not present

## 2017-05-22 LAB — POCT GLYCOSYLATED HEMOGLOBIN (HGB A1C): Hemoglobin A1C: 4.8

## 2017-05-22 LAB — HM DIABETES EYE EXAM

## 2017-05-22 MED ORDER — ZOLPIDEM TARTRATE 10 MG PO TABS: ORAL_TABLET | ORAL | 0 refills | Status: DC

## 2017-05-22 NOTE — Addendum Note (Signed)
Addended by: Steele Sizer F on: 05/22/2017 05:22 PM   Modules accepted: Orders

## 2017-05-22 NOTE — Progress Notes (Signed)
Name: Claire [name]   MRN: [id number]    DOB: [birth year]   Date:05/22/2017       Progress Note  Subjective  Chief Complaint  Chief Complaint  Patient presents with  . Diabetes  . Asthma    HPI    Major Depression: she stopped medication months ago - SSRI, medications was making numb. She is feeling well ,she has noticed more stress at work lately, change in position but being compensated monetarily, stilltakes alprazolam prn, 15 pills lasts about 6 months.She denies suicidal thoughts or ideation.   Insomnia: she has been taking Ambien every night again, denies side effects of medication.  Asthma Mild Intermittentt: she has not been as active lately. She also states the pollen count being high is not helpful and has noticed a increase need to use rescue inhaler with activity to about twice a week. She is on singulair and allergy medication already.   Migraine: she is still seeing Dr. Domingo Cocking - now off Topamax and is on Zonegran, and is doing well at this time, no pain at this time. Migraines about 3 times a month, she works from home when episode is severe.   DMII: diagnosed at age 5, took medication for a while, but stopped all medications 10/25/2010 after bariatric surgery, she has not been checking glucose at home,  no polyphagia, polyuria or polydipsia. Maximum weight of 265 lbs and is down to 128.5 Lbs. lbs.She wanted to lose more weight so we started Ozempic back in March 2018, total loss since started on Ozempic was: 42  Lbs, advised her to maintain weight now. Due for eye exam  GERD: improving again, treated for gastritis, lost weight, but back to normal now, she also has hiatal hernia, based on recent EGD, she is taking PPI once a day, unable to wean self off completely even with dietary changes and weight loss.   Constipation : resolved since she stopped taking Lyrica. , back to bowel movements to every 3 days  Back pain:  seen by [facility], and is seeing Dr.  Dorcas Mcmurray, she had NCS/EMG by Dr. Domingo Cocking and it was normal, Dr. Dorcas Mcmurray did NCS/EMG on upper extremity and she has carpal tunnel bilaterally. No bowel or bladder incontinence. She is off Lyrica , pain today is zero. She states back pain still happens, she has a balance at [facility] and cannot go at this time. She is using CBD oils to help with symptoms at this time    Patient Active Problem List   Diagnosis Date Noted  . Atherosclerosis of aorta ([place]) 12/21/2016  . Kidney stones 12/21/2016  . Sacroiliac inflammation ([place]) 11/19/2016  . Esophageal hiatal hernia 08/17/2016  . Diverticulosis of colon 08/17/2016  . Iron deficiency anemia 08/08/2015  . Lumbosacral pain 01/07/2015  . Muscle twitching 01/07/2015  . Intertrigo 11/05/2014  . Perennial allergic rhinitis 09/03/2014  . Lung nodules 09/03/2014  . Vitamin D deficiency 09/03/2014  . Diet-controlled diabetes mellitus ([place]) 09/03/2014  . History of kidney stones 09/03/2014  . Chronic insomnia 09/03/2014  . History of iron deficiency anemia 09/03/2014  . Depression with anxiety 09/03/2014  . Palpitations 06/22/2014  . History of bariatric surgery 12/03/2013  . Asthma, mild persistent 11/21/2012  . CN (constipation) 11/21/2012  . GERD without esophagitis 11/21/2012  . History of hyperlipidemia 11/21/2012  . Migraine with aura and without status migrainosus 11/21/2012  . History of sleep apnea 11/21/2012  . IBS (irritable bowel syndrome) 08/15/2012    Past Surgical History:  Procedure Laterality Date  . BLADDER SURGERY  2010  . BREAST BIOPSY Right 2014   stereotatic biopsy  . CHOLECYSTECTOMY  2010  . COLONOSCOPY  2014    Done at [facility]  . DILITATION & CURRETTAGE/HYSTROSCOPY WITH NOVASURE ABLATION N/A 06/16/2015   Procedure: DILATATION & CURETTAGE/HYSTEROSCOPY WITH NOVASURE ABLATION;  Surgeon: Brien Few, MD;  Location: [facility];  Service: Gynecology;  Laterality: N/A;  . [place] RESECTION  2012  .  LAPAROSCOPY N/A 06/16/2015   Procedure: LAPAROSCOPY DIAGNOSTIC;  Surgeon: Brien Few, MD;  Location: [facility];  Service: Gynecology;  Laterality: N/A;  . LYSIS OF ADHESION N/A 06/16/2015   Procedure: LYSIS OF ADHESION;  Surgeon: Brien Few, MD;  Location: [facility];  Service: Gynecology;  Laterality: N/A;  . PLANTAR FASCIA SURGERY Bilateral   . ROBOTIC ASSISTED SALPINGO OOPHERECTOMY Right 06/16/2015   Procedure: ROBOTIC ASSISTED SALPINGO OOPHORECTOMY, EXCISION OF RIGHT CUL DE [place] MASS;  Surgeon: Brien Few, MD;  Location: [facility];  Service: Gynecology;  Laterality: Right;  . TONSILLECTOMY    . UPPER GI ENDOSCOPY      Family History  Problem Relation Age of Onset  . Heart attack Mother   . Stroke Mother   . Multiple myeloma Mother   . Hyperlipidemia Father   . Heart attack Sister   . Heart attack Maternal Uncle   . Heart attack Paternal Grandfather   . Breast cancer Paternal Aunt   . Breast cancer Paternal Grandmother   . Lung cancer Maternal Grandfather     Social History   Socioeconomic History  . Marital status: Married    Spouse name: Not on file  . Number of children: Not on file  . Years of education: Not on file  . Highest education level: Not on file  Occupational History  . Not on file  Social Needs  . Financial resource strain: Not on file  . Food insecurity:    Worry: Not on file    Inability: Not on file  . Transportation needs:    Medical: Not on file    Non-medical: Not on file  Tobacco Use  . Smoking status: Former Smoker    Packs/day: 1.00    Years: 10.00    Pack years: 10.00    Types: Cigarettes    Last attempt to quit: 02/19/1993    Years since quitting: 24.2  . Smokeless tobacco: Never Used  Substance and Sexual Activity  . Alcohol use: Yes    Alcohol/week: 0.0 oz    Comment: occasionally  . Drug use: No  . Sexual activity: Yes    Partners: Male  Lifestyle  . Physical activity:    Days per week: Not on file    Minutes per session: Not  on file  . Stress: Not on file  Relationships  . Social connections:    Talks on phone: Not on file    Gets together: Not on file    Attends religious service: Not on file    Active member of club or organization: Not on file    Attends meetings of clubs or organizations: Not on file    Relationship status: Not on file  . Intimate partner violence:    Fear of current or ex partner: Not on file    Emotionally abused: Not on file    Physically abused: Not on file    Forced sexual activity: Not on file  Other Topics Concern  . Not on file  Social History Narrative  .  Not on file     Current Outpatient Medications:  .  albuterol (PROAIR HFA) 108 (90 Base) MCG/ACT inhaler, TAKE 2 PUFFS EVERY 6 HOURS AS NEEDED FOR WHEEZING OR SHORTNESS OF BREATH. (VENTOLIN NOT COVERED), Disp: 25.5 Inhaler, Rfl: 0 .  ALPRAZolam (XANAX) 0.5 MG tablet, Take 1 tablet (0.5 mg total) by mouth at bedtime as needed for anxiety., Disp: 15 tablet, Rfl: 0 .  Ascorbic Acid (VITAMIN C) 1000 MG tablet, Take 1,000 mg by mouth., Disp: , Rfl:  .  atorvastatin (LIPITOR) 10 MG tablet, Take 1 tablet (10 mg total) by mouth daily., Disp: 90 tablet, Rfl: 3 .  calcium carbonate (OS-CAL) 600 MG TABS tablet, Take 600 mg by mouth daily with breakfast., Disp: , Rfl:  .  clobetasol ointment (TEMOVATE) 0.05 %, , Disp: , Rfl: 0 .  cyclobenzaprine (FLEXERIL) 10 MG tablet, Take 1 tablet (10 mg total) by mouth at bedtime. Prn, Disp: 90 tablet, Rfl: 1 .  ferrous sulfate 325 (65 FE) MG EC tablet, Take 325 mg by mouth daily with breakfast. , Disp: , Rfl:  .  fluconazole (DIFLUCAN) 150 MG tablet, Take 1 tablet (150 mg total) by mouth every other day., Disp: 3 tablet, Rfl: 0 .  gentamicin ointment (GARAMYCIN) 0.1 %, APP IN NOSE TID PRF NASAL DRYNESS UTD, Disp: , Rfl: 0 .  lansoprazole (PREVACID) 30 MG capsule, TAKE 1 CAPSULE DAILY *MYLAN   MFR, Disp: 90 capsule, Rfl: 1 .  loratadine (CLARITIN) 10 MG tablet, Take 10 mg by mouth daily., Disp: ,  Rfl:  .  LYRICA 50 MG capsule, Take 1 capsule by mouth daily., Disp: , Rfl:  .  meclizine (ANTIVERT) 25 MG tablet, TK 1 T PO Q 8 H PRF VERTIGO, Disp: , Rfl: 10 .  mometasone (ELOCON) 0.1 % lotion, APP TO ITCHY EARS BID FOR 12 DAYS. MAY REPEAT PRF ITCHY EARS, Disp: , Rfl: 4 .  mometasone (NASONEX) 50 MCG/ACT nasal spray, USE 2 SPRAYS IN EACH NOSTRIL AT BEDTIME, Disp: 51 g, Rfl: 1 .  montelukast (SINGULAIR) 10 MG tablet, TAKE 1 TABLET(10 MG) BY MOUTH AT BEDTIME, Disp: 90 tablet, Rfl: 1 .  nystatin-triamcinolone ointment (MYCOLOG), Apply 1 application 2 (two) times daily topically. Mix at home with A&D ointment apply with each bowel movement, Disp: 60 g, Rfl: 0 .  OZEMPIC 1 MG/DOSE SOPN, INJECT 1 MG INTO THE SKIN ONCE A WEEK., Disp: 9 mL, Rfl: 0 .  Potassium 99 MG TABS, Take by mouth., Disp: , Rfl:  .  promethazine (PHENERGAN) 12.5 MG tablet, TAKE 1 TABLET BY MOUTH EVERY 6 HOURS AS NEEDED FOR MIGRAINES WITH NAUSEA, Disp: 20 tablet, Rfl: 0 .  SUMAtriptan (IMITREX) 100 MG tablet, TAKE 1 TABLET AS NEEDED FOR MIGRAINE EPISODE, NO MORE THAN 2 IN 24 HOURS, Disp: 18 tablet, Rfl: 1 .  vitamin B-12 (CYANOCOBALAMIN) 100 MCG tablet, Take 100 mcg by mouth as directed. , Disp: , Rfl:  .  Vitamin D, Ergocalciferol, (DRISDOL) 50000 units CAPS capsule, Take 1 capsule (50,000 Units total) by mouth every 7 (seven) days. TAKE 1 CAPSULE EVERY 7 DAYS, Disp: 12 capsule, Rfl: 0 .  zolpidem (AMBIEN) 10 MG tablet, Must be [employer], Disp: 90 tablet, Rfl: 0 .  zonisamide (ZONEGRAN) 50 MG capsule, Take 3 capsules by mouth daily., Disp: , Rfl: 2  Allergies  Allergen Reactions  . Gabapentin     Bladder incontinence at higher dose  . Linzess [Linaclotide]     Worsening of diarrhea  . Other  Hives and Other (See Comments)    Ricotta Cheese: flushed and hot Ricotta Cheese: flushed and hot Ricotta Cheese: flushed and hot  . Cymbalta [Duloxetine Hcl] Palpitations    And photosensitivity  . Vicodin  [Hydrocodone-Acetaminophen] Itching     ROS  Constitutional: Negative for fever, positive for  weight change.  Respiratory: Negative for cough , positive for intermittent  shortness of breath.   Cardiovascular: Negative for chest pain or palpitations.  Gastrointestinal: Negative for abdominal pain, no bowel changes.  Musculoskeletal: Negative for gait problem or joint swelling.  Skin: Negative for rash.  Neurological: Negative for dizziness, positive for occasional  headache.  No other specific complaints in a complete review of systems (except as listed in HPI above).  Objective  Vitals:   05/22/17 1513  BP: 108/80  Pulse: 82  Resp: 14  SpO2: 99%  Weight: 120 lb 1.6 oz (54.5 kg)  Height: 5' 1"  (1.549 m)    Body mass index is 22.69 kg/m.  Physical Exam  Constitutional: Patient appears well-developed and well-nourished.No distress.  HEENT: head atraumatic, normocephalic, pupils equal and reactive to light,  neck supple, throat within normal limits, braces  Cardiovascular: Normal rate, regular rhythm and normal heart sounds.  No murmur heard. No BLE edema. Pulmonary/Chest: Effort normal and breath sounds normal. No respiratory distress. Abdominal: Soft.  There is no tenderness. Psychiatric: Patient has a normal mood and affect. behavior is normal. Judgment and thought content normal.   PHQ2/9: Depression screen [facility] 2/9 05/22/2017 02/20/2017 06/18/2016 05/16/2016 02/22/2016  Decreased Interest 0 0 0 0 0  Down, Depressed, Hopeless 0 0 0 0 0  PHQ - 2 Score 0 0 0 0 0  Altered sleeping 0 - - - -  Tired, decreased energy 3 - - - -  Change in appetite 0 - - - -  Feeling bad or failure about yourself  0 - - - -  Trouble concentrating 3 - - - -  Moving slowly or fidgety/restless 0 - - - -  Suicidal thoughts 0 - - - -  PHQ-9 Score 6 - - - -  Difficult doing work/chores Not difficult at all - - - -     Fall Risk: Fall Risk  05/22/2017 02/20/2017 12/31/2016 12/21/2016 06/18/2016  Falls  in the past year? No No No No No      Functional Status Survey: Is the patient deaf or have difficulty hearing?: No Does the patient have difficulty seeing, even when wearing glasses/contacts?: No Does the patient have difficulty concentrating, remembering, or making decisions?: No Does the patient have difficulty walking or climbing stairs?: No Does the patient have difficulty dressing or bathing?: No Does the patient have difficulty doing errands alone such as visiting a doctor's office or shopping?: No    Assessment & Plan  1. Diet-controlled diabetes mellitus ([place])  - POCT HgB A1C  2. Chronic insomnia  - zolpidem (AMBIEN) 10 MG tablet; Must be [employer]  Dispense: 90 tablet; Refill: 0  3. Migraine with aura and without status migrainosus, not intractable  Continue follow up with Dr. Domingo Cocking   4. Mild intermittent asthma without complication  Try to walk more and avoid being outside for a long period of time  5. Atherosclerosis of aorta ([facility])  On statin and baby aspirin   6. History of bariatric surgery  Doing well at this time  7. Major depression in remission [facility])  Doing well at this time

## 2017-06-05 DIAGNOSIS — Z01419 Encounter for gynecological examination (general) (routine) without abnormal findings: Secondary | ICD-10-CM | POA: Diagnosis not present

## 2017-06-05 DIAGNOSIS — Z1231 Encounter for screening mammogram for malignant neoplasm of breast: Secondary | ICD-10-CM | POA: Diagnosis not present

## 2017-06-05 DIAGNOSIS — Z1151 Encounter for screening for human papillomavirus (HPV): Secondary | ICD-10-CM | POA: Diagnosis not present

## 2017-06-05 DIAGNOSIS — Z6823 Body mass index (BMI) 23.0-23.9, adult: Secondary | ICD-10-CM | POA: Diagnosis not present

## 2017-06-05 LAB — HM MAMMOGRAPHY

## 2017-06-06 LAB — HM PAP SMEAR: HM PAP: NORMAL

## 2017-06-11 DIAGNOSIS — R51 Headache: Secondary | ICD-10-CM | POA: Diagnosis not present

## 2017-06-11 DIAGNOSIS — M542 Cervicalgia: Secondary | ICD-10-CM | POA: Diagnosis not present

## 2017-06-11 DIAGNOSIS — G43839 Menstrual migraine, intractable, without status migrainosus: Secondary | ICD-10-CM | POA: Diagnosis not present

## 2017-06-11 DIAGNOSIS — M791 Myalgia, unspecified site: Secondary | ICD-10-CM | POA: Diagnosis not present

## 2017-06-11 DIAGNOSIS — G43019 Migraine without aura, intractable, without status migrainosus: Secondary | ICD-10-CM | POA: Diagnosis not present

## 2017-06-11 DIAGNOSIS — G518 Other disorders of facial nerve: Secondary | ICD-10-CM | POA: Diagnosis not present

## 2017-07-02 ENCOUNTER — Telehealth: Payer: Self-pay | Admitting: Gastroenterology

## 2017-07-02 NOTE — Telephone Encounter (Signed)
Pt leftg vm she states she received 2 bills  That states Dr. Bonna Gains was not a vailit provider in Nov. and was not until January 31st she would like some answers regarding this her statement  Guarantor # [id number]

## 2017-07-17 ENCOUNTER — Other Ambulatory Visit: Payer: Self-pay | Admitting: Family Medicine

## 2017-07-17 DIAGNOSIS — E559 Vitamin D deficiency, unspecified: Secondary | ICD-10-CM

## 2017-07-25 ENCOUNTER — Other Ambulatory Visit: Payer: Self-pay | Admitting: Family Medicine

## 2017-07-26 NOTE — Telephone Encounter (Signed)
Refill request for general medication. Diflucan   Last office visit: 05/22/2017   Follow up on 09/04/2017

## 2017-08-17 ENCOUNTER — Other Ambulatory Visit: Payer: Self-pay | Admitting: Family Medicine

## 2017-08-17 DIAGNOSIS — J3089 Other allergic rhinitis: Secondary | ICD-10-CM

## 2017-08-21 ENCOUNTER — Telehealth: Payer: Self-pay | Admitting: Family Medicine

## 2017-08-21 NOTE — Telephone Encounter (Signed)
Copied from [place] [id number]. Topic: Quick Communication - Rx Refill/Question >> Aug 21, 2017  2:50 PM Bea Graff, NT wrote: Medication: SUMAtriptan (IMITREX) 100 MG tablet   Has the patient contacted their pharmacy? Yes.   (Agent: If no, request that the patient contact the pharmacy for the refill.) (Agent: If yes, when and what did the pharmacy advise?)  Preferred Pharmacy (with phone number or street name): [id number] [address], [place] [phone] (Phone) [phone] (Fax)    Pt will be going out of town on Friday  Agent: Please be advised that RX refills may take up to 3 business days. We ask that you follow-up with your pharmacy.

## 2017-08-23 ENCOUNTER — Other Ambulatory Visit: Payer: Self-pay | Admitting: Family Medicine

## 2017-08-23 MED ORDER — SUMATRIPTAN SUCCINATE 100 MG PO TABS: ORAL_TABLET | ORAL | 1 refills | Status: DC

## 2017-08-26 ENCOUNTER — Other Ambulatory Visit: Payer: Self-pay | Admitting: Family Medicine

## 2017-08-26 DIAGNOSIS — E119 Type 2 diabetes mellitus without complications: Secondary | ICD-10-CM

## 2017-08-27 MED ORDER — SEMAGLUTIDE (1 MG/DOSE) 2 MG/1.5ML ~~LOC~~ SOPN: 1.0000 mg | PEN_INJECTOR | SUBCUTANEOUS | 0 refills | Status: DC

## 2017-08-30 ENCOUNTER — Encounter: Payer: Self-pay | Admitting: Family Medicine

## 2017-09-01 ENCOUNTER — Other Ambulatory Visit: Payer: Self-pay | Admitting: Family Medicine

## 2017-09-01 DIAGNOSIS — Z818 Family history of other mental and behavioral disorders: Secondary | ICD-10-CM

## 2017-09-04 ENCOUNTER — Ambulatory Visit: Payer: BLUE CROSS/BLUE SHIELD | Admitting: Family Medicine

## 2017-09-04 ENCOUNTER — Encounter: Payer: Self-pay | Admitting: Family Medicine

## 2017-09-04 VITALS — BP 120/70 | HR 90 | Temp 98.9°F | Resp 16 | Ht 61.0 in | Wt 118.3 lb

## 2017-09-04 DIAGNOSIS — I700 Atherosclerosis of aorta: Secondary | ICD-10-CM | POA: Diagnosis not present

## 2017-09-04 DIAGNOSIS — K219 Gastro-esophageal reflux disease without esophagitis: Secondary | ICD-10-CM | POA: Diagnosis not present

## 2017-09-04 DIAGNOSIS — F418 Other specified anxiety disorders: Secondary | ICD-10-CM | POA: Diagnosis not present

## 2017-09-04 DIAGNOSIS — J4530 Mild persistent asthma, uncomplicated: Secondary | ICD-10-CM | POA: Diagnosis not present

## 2017-09-04 DIAGNOSIS — E559 Vitamin D deficiency, unspecified: Secondary | ICD-10-CM

## 2017-09-04 DIAGNOSIS — Z9884 Bariatric surgery status: Secondary | ICD-10-CM | POA: Diagnosis not present

## 2017-09-04 DIAGNOSIS — N3000 Acute cystitis without hematuria: Secondary | ICD-10-CM | POA: Diagnosis not present

## 2017-09-04 DIAGNOSIS — F5104 Psychophysiologic insomnia: Secondary | ICD-10-CM

## 2017-09-04 DIAGNOSIS — E119 Type 2 diabetes mellitus without complications: Secondary | ICD-10-CM | POA: Diagnosis not present

## 2017-09-04 DIAGNOSIS — F325 Major depressive disorder, single episode, in full remission: Secondary | ICD-10-CM

## 2017-09-04 DIAGNOSIS — Z862 Personal history of diseases of the blood and blood-forming organs and certain disorders involving the immune mechanism: Secondary | ICD-10-CM | POA: Diagnosis not present

## 2017-09-04 DIAGNOSIS — R350 Frequency of micturition: Secondary | ICD-10-CM

## 2017-09-04 LAB — POCT URINALYSIS DIPSTICK
BILIRUBIN UA: NEGATIVE
GLUCOSE UA: NEGATIVE
KETONES UA: NEGATIVE
Nitrite, UA: NEGATIVE
PH UA: 7.5 (ref 5.0–8.0)
Protein, UA: POSITIVE — AB
RBC UA: NEGATIVE
SPEC GRAV UA: 1.01 (ref 1.010–1.025)
UROBILINOGEN UA: 0.2 U/dL

## 2017-09-04 MED ORDER — SEMAGLUTIDE (1 MG/DOSE) 2 MG/1.5ML ~~LOC~~ SOPN: 1.0000 mg | PEN_INJECTOR | SUBCUTANEOUS | 1 refills | Status: DC

## 2017-09-04 MED ORDER — ALPRAZOLAM 0.5 MG PO TABS: 0.5000 mg | ORAL_TABLET | Freq: Every evening | ORAL | 0 refills | Status: DC | PRN

## 2017-09-04 MED ORDER — LANSOPRAZOLE 30 MG PO CPDR: DELAYED_RELEASE_CAPSULE | ORAL | 1 refills | Status: DC

## 2017-09-04 MED ORDER — NITROFURANTOIN MONOHYD MACRO 100 MG PO CAPS: 100.0000 mg | ORAL_CAPSULE | Freq: Two times a day (BID) | ORAL | 0 refills | Status: DC

## 2017-09-04 MED ORDER — ZOLPIDEM TARTRATE 10 MG PO TABS: ORAL_TABLET | ORAL | 0 refills | Status: DC

## 2017-09-04 MED ORDER — MONTELUKAST SODIUM 10 MG PO TABS: ORAL_TABLET | ORAL | 1 refills | Status: DC

## 2017-09-04 NOTE — Progress Notes (Signed)
Name: Claire [name]   MRN: [id number]    DOB: 05-20-1970   Date:09/04/2017       Progress Note  Subjective  Chief Complaint  Chief Complaint  Patient presents with  . Diabetes    3 month follow up  . Hypertension  . Urinary Tract Infection    HPI   Major Depression: she stopped medication months ago - SSRI, medications was making numb. She is feeling well ,she has noticed more stress at work lately, change in position but being compensated monetarily, stilltakes alprazolam prn, 15 pills lasts about 6 months.She denies suicidal thoughts or ideation. She is doing well, phq 9 negative She needs a refill of alprazolam   Urinary frequency: she states that she went to the beach last week, and four days ago she noticed lower abdominal pain, fullness, also urinary frequency and also noticed white debris in her urine today. No fever or chills, denies hematuria.   Insomnia: she has been taking Ambien every night again, denies side effects of medication. She needs to have Tev[place] brand and gets it though mail order.   Asthma MildIntermittentt:she is doing well on singulair, uses albuterol prn only and has been doing well, symptoms are worse during the Spring.   Migraine: she is still seeing Dr. Domingo Cocking - now off Topamax and is on Zonegran, and is doing well at this time, no pain at this time. Migraines about 3 times a month, she works from home when episode is severe.   DMII: diagnosed at age 80, took medication for a while, but stopped all medications 10/25/2010 after bariatric surgery,she has not been checking glucose at home,no polyphagia, polyuria or polydipsia. Maximum weight of 265 lbs and is down to 118.3 lbs  Lbs.lbs.She wanted to lose more weight so we started Ozempic back in March 2018, total loss since started on Ozempic was: 45  Lbs, advised her to maintain weight now, BMI is normal today . We will get a copy of eye exam done in April 2019  GERD: improving again,  treated for gastritis, lost weight, but back to normal now, she also has hiatal hernia, based on recent EGD, she is taking PPI once a day, unable to wean self off completely even with dietary changes and weight loss.   Constipation : resolved since she stopped taking Lyrica. , back to bowel movements almost daily now. She takes probiotics and seems to help   Back pain:  seen by [facility], and is seeing Dr. Dorcas Mcmurray, she had NCS/EMG by Dr. Domingo Cocking and it was normal, Dr. Dorcas Mcmurray did NCS/EMG on upper extremity and she has carpal tunnel bilaterally. No bowel or bladder incontinence. She is off Lyrica , pain today is zero. She states back pain still happens, she has a balance at [facility] and cannot go at this time. She was  using CBD oils to help with symptoms however not over the past couple of months.    Patient Active Problem List   Diagnosis Date Noted  . Atherosclerosis of aorta ([place]) 12/21/2016  . Kidney stones 12/21/2016  . Sacroiliac inflammation ([place]) 11/19/2016  . Esophageal hiatal hernia 08/17/2016  . Diverticulosis of colon 08/17/2016  . Iron deficiency anemia 08/08/2015  . Lumbosacral pain 01/07/2015  . Muscle twitching 01/07/2015  . Intertrigo 11/05/2014  . Perennial allergic rhinitis 09/03/2014  . Lung nodules 09/03/2014  . Vitamin D deficiency 09/03/2014  . Diet-controlled diabetes mellitus ([place]) 09/03/2014  . History of kidney stones 09/03/2014  . Chronic insomnia  09/03/2014  . History of iron deficiency anemia 09/03/2014  . Depression with anxiety 09/03/2014  . Palpitations 06/22/2014  . History of bariatric surgery 12/03/2013  . Asthma, mild persistent 11/21/2012  . CN (constipation) 11/21/2012  . GERD without esophagitis 11/21/2012  . History of hyperlipidemia 11/21/2012  . Migraine with aura and without status migrainosus 11/21/2012  . History of sleep apnea 11/21/2012  . IBS (irritable bowel syndrome) 08/15/2012    Past Surgical History:  Procedure  Laterality Date  . BLADDER SURGERY  2010  . BREAST BIOPSY Right 2014   stereotatic biopsy  . CHOLECYSTECTOMY  2010  . COLONOSCOPY  2014    Done at [facility]  . DILITATION & CURRETTAGE/HYSTROSCOPY WITH NOVASURE ABLATION N/A 06/16/2015   Procedure: DILATATION & CURETTAGE/HYSTEROSCOPY WITH NOVASURE ABLATION;  Surgeon: Brien Few, MD;  Location: [facility];  Service: Gynecology;  Laterality: N/A;  . [address] RESECTION  2012  . LAPAROSCOPY N/A 06/16/2015   Procedure: LAPAROSCOPY DIAGNOSTIC;  Surgeon: Brien Few, MD;  Location: [facility];  Service: Gynecology;  Laterality: N/A;  . LYSIS OF ADHESION N/A 06/16/2015   Procedure: LYSIS OF ADHESION;  Surgeon: Brien Few, MD;  Location: [facility];  Service: Gynecology;  Laterality: N/A;  . PLANTAR FASCIA SURGERY Bilateral   . ROBOTIC ASSISTED SALPINGO OOPHERECTOMY Right 06/16/2015   Procedure: ROBOTIC ASSISTED SALPINGO OOPHORECTOMY, EXCISION OF RIGHT CUL DE [place] MASS;  Surgeon: Brien Few, MD;  Location: [facility];  Service: Gynecology;  Laterality: Right;  . TONSILLECTOMY    . UPPER GI ENDOSCOPY      Family History  Problem Relation Age of Onset  . Heart attack Mother   . Stroke Mother   . Multiple myeloma Mother   . Hyperlipidemia Father   . Heart attack Sister   . Heart attack Maternal Uncle   . Heart attack Paternal Grandfather   . Breast cancer Paternal Aunt   . Breast cancer Paternal Grandmother   . Lung cancer Maternal Grandfather     Social History   Socioeconomic History  . Marital status: Married    Spouse name: Not on file  . Number of children: Not on file  . Years of education: Not on file  . Highest education level: Not on file  Occupational History  . Not on file  Social Needs  . Financial resource strain: Not on file  . Food insecurity:    Worry: Not on file    Inability: Not on file  . Transportation needs:    Medical: Not on file    Non-medical: Not on file  Tobacco Use  . Smoking status:  Former Smoker    Packs/day: 1.00    Years: 10.00    Pack years: 10.00    Types: Cigarettes    Last attempt to quit: 02/19/1993    Years since quitting: 24.5  . Smokeless tobacco: Never Used  Substance and Sexual Activity  . Alcohol use: Yes    Alcohol/week: 0.0 oz    Comment: occasionally  . Drug use: No  . Sexual activity: Yes    Partners: Male  Lifestyle  . Physical activity:    Days per week: Not on file    Minutes per session: Not on file  . Stress: Not on file  Relationships  . Social connections:    Talks on phone: Not on file    Gets together: Not on file    Attends religious service: Not on file    Active member of club or  organization: Not on file    Attends meetings of clubs or organizations: Not on file    Relationship status: Not on file  . Intimate partner violence:    Fear of current or ex partner: Not on file    Emotionally abused: Not on file    Physically abused: Not on file    Forced sexual activity: Not on file  Other Topics Concern  . Not on file  Social History Narrative  . Not on file     Current Outpatient Medications:  .  albuterol (PROAIR HFA) 108 (90 Base) MCG/ACT inhaler, TAKE 2 PUFFS EVERY 6 HOURS AS NEEDED FOR WHEEZING OR SHORTNESS OF BREATH. (VENTOLIN NOT COVERED), Disp: 25.5 Inhaler, Rfl: 0 .  ALPRAZolam (XANAX) 0.5 MG tablet, Take 1 tablet (0.5 mg total) by mouth at bedtime as needed for anxiety., Disp: 15 tablet, Rfl: 0 .  Ascorbic Acid (VITAMIN C) 1000 MG tablet, Take 1,000 mg by mouth., Disp: , Rfl:  .  atorvastatin (LIPITOR) 10 MG tablet, Take 1 tablet (10 mg total) by mouth daily., Disp: 90 tablet, Rfl: 3 .  calcium carbonate (OS-CAL) 600 MG TABS tablet, Take 600 mg by mouth daily with breakfast., Disp: , Rfl:  .  clobetasol ointment (TEMOVATE) 0.05 %, , Disp: , Rfl: 0 .  cyclobenzaprine (FLEXERIL) 10 MG tablet, Take 1 tablet (10 mg total) by mouth at bedtime. Prn, Disp: 90 tablet, Rfl: 1 .  ferrous sulfate 325 (65 FE) MG EC tablet,  Take 325 mg by mouth daily with breakfast. , Disp: , Rfl:  .  fluconazole (DIFLUCAN) 150 MG tablet, TAKE 1 TABLET (150 MG TOTAL) BY MOUTH EVERY OTHER DAY., Disp: 3 tablet, Rfl: 0 .  gentamicin ointment (GARAMYCIN) 0.1 %, APP IN NOSE TID PRF NASAL DRYNESS UTD, Disp: , Rfl: 0 .  lansoprazole (PREVACID) 30 MG capsule, TAKE 1 CAPSULE DAILY *MYLAN   MFR, Disp: 90 capsule, Rfl: 1 .  loratadine (CLARITIN) 10 MG tablet, Take 10 mg by mouth daily., Disp: , Rfl:  .  meclizine (ANTIVERT) 25 MG tablet, TK 1 T PO Q 8 H PRF VERTIGO, Disp: , Rfl: 10 .  mometasone (ELOCON) 0.1 % lotion, APP TO ITCHY EARS BID FOR 12 DAYS. MAY REPEAT PRF ITCHY EARS, Disp: , Rfl: 4 .  mometasone (NASONEX) 50 MCG/ACT nasal spray, USE 2 SPRAYS IN EACH NOSTRIL AT BEDTIME, Disp: 48 g, Rfl: 1 .  montelukast (SINGULAIR) 10 MG tablet, TAKE 1 TABLET(10 MG) BY MOUTH AT BEDTIME, Disp: 90 tablet, Rfl: 1 .  nystatin-triamcinolone ointment (MYCOLOG), Apply 1 application 2 (two) times daily topically. Mix at home with A&D ointment apply with each bowel movement, Disp: 60 g, Rfl: 0 .  Potassium 99 MG TABS, Take by mouth., Disp: , Rfl:  .  promethazine (PHENERGAN) 12.5 MG tablet, TAKE 1 TABLET BY MOUTH EVERY 6 HOURS AS NEEDED FOR MIGRAINES WITH NAUSEA, Disp: 20 tablet, Rfl: 0 .  Semaglutide (OZEMPIC) 1 MG/DOSE SOPN, Inject 1 mg into the skin once a week., Disp: 9 mL, Rfl: 1 .  SUMAtriptan (IMITREX) 100 MG tablet, No more than 2 in 24 hours, Disp: 9 tablet, Rfl: 1 .  vitamin B-12 (CYANOCOBALAMIN) 100 MCG tablet, Take 100 mcg by mouth as directed. , Disp: , Rfl:  .  Vitamin D, Ergocalciferol, (DRISDOL) 50000 units CAPS capsule, TAKE 1 CAPSULE (50,000 UNITS TOTAL) BY MOUTH EVERY 7 (SEVEN) DAYS. TAKE 1 CAPSULE EVERY 7 DAYS, Disp: 12 capsule, Rfl: 0 .  zolpidem (AMBIEN) 10 MG  tablet, Must be [employer], Disp: 90 tablet, Rfl: 0 .  zonisamide (ZONEGRAN) 50 MG capsule, Take 3 capsules by mouth daily., Disp: , Rfl: 2 .  LYRICA 50 MG capsule, Take 1  capsule by mouth daily., Disp: , Rfl:  .  nitrofurantoin, macrocrystal-monohydrate, (MACROBID) 100 MG capsule, Take 1 capsule (100 mg total) by mouth 2 (two) times daily., Disp: 10 capsule, Rfl: 0  Allergies  Allergen Reactions  . Gabapentin     Bladder incontinence at higher dose  . Linzess [Linaclotide]     Worsening of diarrhea  . Other Hives and Other (See Comments)    Ricotta Cheese: flushed and hot Ricotta Cheese: flushed and hot Ricotta Cheese: flushed and hot  . Cymbalta [Duloxetine Hcl] Palpitations    And photosensitivity  . Vicodin [Hydrocodone-Acetaminophen] Itching     ROS  Constitutional: Negative for fever or significant  weight change.  Respiratory: Negative for cough and shortness of breath.   Cardiovascular: Negative for chest pain or palpitations.  Gastrointestinal: Positiive  for abdominal pain, no bowel changes.  Musculoskeletal: Negative for gait problem or joint swelling.  Skin: Negative for rash.  Neurological: Negative for dizziness or headache.  No other specific complaints in a complete review of systems (except as listed in HPI above).  Objective  Vitals:   09/04/17 1514  BP: 120/70  Pulse: 90  Resp: 16  Temp: 98.9 F (37.2 C)  TempSrc: Oral  SpO2: 95%  Weight: 118 lb 4.8 oz (53.7 kg)  Height: 5' 1"  (1.549 m)    Body mass index is 22.35 kg/m.  Physical Exam  Constitutional: Patient appears well-developed and well-nourished.  No distress.  HEENT: head atraumatic, normocephalic, pupils equal and reactive to light,  neck supple, throat within normal limits Cardiovascular: Normal rate, regular rhythm and normal heart sounds.  No murmur heard. No BLE edema. Pulmonary/Chest: Effort normal and breath sounds normal. No respiratory distress. Abdominal: Soft.  There is no tenderness. Psychiatric: Patient has a normal mood and affect. behavior is normal. Judgment and thought content normal.  Recent Results (from the past 2160 hour(s))  POCT  urinalysis dipstick     Status: Abnormal   Collection Time: 09/04/17  3:28 PM  Result Value Ref Range   Color, UA yellow    Clarity, UA clear    Glucose, UA Negative Negative   Bilirubin, UA negative    Ketones, UA negative    Spec Grav, UA 1.010 1.010 - 1.025   Blood, UA negative    pH, UA 7.5 5.0 - 8.0   Protein, UA Positive (A) Negative   Urobilinogen, UA 0.2 0.2 or 1.0 E.U./dL   Nitrite, UA negative    Leukocytes, UA Small (1+) (A) Negative   Appearance clear    Odor none      PHQ2/9: Depression screen [facility] 2/9 09/04/2017 05/22/2017 02/20/2017 06/18/2016 05/16/2016  Decreased Interest 0 0 0 0 0  Down, Depressed, Hopeless 0 0 0 0 0  PHQ - 2 Score 0 0 0 0 0  Altered sleeping 0 0 - - -  Tired, decreased energy 0 3 - - -  Change in appetite 0 0 - - -  Feeling bad or failure about yourself  0 0 - - -  Trouble concentrating 0 3 - - -  Moving slowly or fidgety/restless 0 0 - - -  Suicidal thoughts 0 0 - - -  PHQ-9 Score 0 6 - - -  Difficult doing work/chores Not difficult at all Not  difficult at all - - -     Fall Risk: Fall Risk  09/04/2017 05/22/2017 02/20/2017 12/31/2016 12/21/2016  Falls in the past year? No No No No No      Assessment & Plan  1. Urinary frequency  - POCT urinalysis dipstick - CULTURE, URINE COMPREHENSIVE We will try empirical therapy  macrobid BID #10   2. Atherosclerosis of aorta ([facility])  Continue statin therapy   3. Diet-controlled diabetes mellitus ([place])  - Semaglutide (OZEMPIC) 1 MG/DOSE SOPN; Inject 1 mg into the skin once a week.  Dispense: 9 mL; Refill: 1  4. Chronic insomnia  - zolpidem (AMBIEN) 10 MG tablet; Must be [employer]  Dispense: 90 tablet; Refill: 0  5. GERD without esophagitis  - lansoprazole (PREVACID) 30 MG capsule; TAKE 1 CAPSULE DAILY *MYLAN   MFR  Dispense: 90 capsule; Refill: 1  6. Mild persistent asthma without complication  - montelukast (SINGULAIR) 10 MG tablet; TAKE 1 TABLET(10 MG) BY MOUTH AT BEDTIME   Dispense: 90 tablet; Refill: 1  7. History of bariatric surgery  Doing well at this time  8. Major depression in remission [facility])  Doing well

## 2017-09-05 DIAGNOSIS — F900 Attention-deficit hyperactivity disorder, predominantly inattentive type: Secondary | ICD-10-CM | POA: Diagnosis not present

## 2017-09-05 DIAGNOSIS — R55 Syncope and collapse: Secondary | ICD-10-CM | POA: Insufficient documentation

## 2017-09-06 LAB — CULTURE, URINE COMPREHENSIVE
MICRO NUMBER: 90851410
SPECIMEN QUALITY:: ADEQUATE

## 2017-09-10 DIAGNOSIS — R51 Headache: Secondary | ICD-10-CM | POA: Diagnosis not present

## 2017-09-10 DIAGNOSIS — G43839 Menstrual migraine, intractable, without status migrainosus: Secondary | ICD-10-CM | POA: Diagnosis not present

## 2017-09-10 DIAGNOSIS — M791 Myalgia, unspecified site: Secondary | ICD-10-CM | POA: Diagnosis not present

## 2017-09-10 DIAGNOSIS — G518 Other disorders of facial nerve: Secondary | ICD-10-CM | POA: Diagnosis not present

## 2017-09-10 DIAGNOSIS — M542 Cervicalgia: Secondary | ICD-10-CM | POA: Diagnosis not present

## 2017-09-10 DIAGNOSIS — G43019 Migraine without aura, intractable, without status migrainosus: Secondary | ICD-10-CM | POA: Diagnosis not present

## 2017-09-11 DIAGNOSIS — Z9884 Bariatric surgery status: Secondary | ICD-10-CM | POA: Diagnosis not present

## 2017-09-11 DIAGNOSIS — E559 Vitamin D deficiency, unspecified: Secondary | ICD-10-CM | POA: Diagnosis not present

## 2017-09-11 DIAGNOSIS — E119 Type 2 diabetes mellitus without complications: Secondary | ICD-10-CM | POA: Diagnosis not present

## 2017-09-11 DIAGNOSIS — I700 Atherosclerosis of aorta: Secondary | ICD-10-CM | POA: Diagnosis not present

## 2017-09-11 DIAGNOSIS — Z862 Personal history of diseases of the blood and blood-forming organs and certain disorders involving the immune mechanism: Secondary | ICD-10-CM | POA: Diagnosis not present

## 2017-09-12 LAB — COMPREHENSIVE METABOLIC PANEL
ALBUMIN: 4.1 g/dL (ref 3.5–5.5)
ALT: 14 IU/L (ref 0–32)
AST: 13 IU/L (ref 0–40)
Albumin/Globulin Ratio: 1.9 (ref 1.2–2.2)
Alkaline Phosphatase: 56 IU/L (ref 39–117)
BUN / CREAT RATIO: 13 (ref 9–23)
BUN: 8 mg/dL (ref 6–24)
Bilirubin Total: 0.5 mg/dL (ref 0.0–1.2)
CO2: 19 mmol/L — AB (ref 20–29)
CREATININE: 0.61 mg/dL (ref 0.57–1.00)
Calcium: 9.1 mg/dL (ref 8.7–10.2)
Chloride: 108 mmol/L — ABNORMAL HIGH (ref 96–106)
GFR calc non Af Amer: 110 mL/min/{1.73_m2} (ref >59)
GFR, EST AFRICAN AMERICAN: 127 mL/min/{1.73_m2} (ref >59)
GLOBULIN, TOTAL: 2.2 g/dL (ref 1.5–4.5)
GLUCOSE: 87 mg/dL (ref 65–99)
Potassium: 3.7 mmol/L (ref 3.5–5.2)
SODIUM: 140 mmol/L (ref 134–144)
Total Protein: 6.3 g/dL (ref 6.0–8.5)

## 2017-09-12 LAB — CBC WITH DIFFERENTIAL/PLATELET
BASOS ABS: 0 10*3/uL (ref 0.0–0.2)
Basos: 0 %
EOS (ABSOLUTE): 0 10*3/uL (ref 0.0–0.4)
EOS: 0 %
HEMATOCRIT: 40.7 % (ref 34.0–46.6)
HEMOGLOBIN: 13.3 g/dL (ref 11.1–15.9)
IMMATURE GRANS (ABS): 0 10*3/uL (ref 0.0–0.1)
Immature Granulocytes: 0 %
LYMPHS: 10 %
Lymphocytes Absolute: 1.3 10*3/uL (ref 0.7–3.1)
MCH: 30 pg (ref 26.6–33.0)
MCHC: 32.7 g/dL (ref 31.5–35.7)
MCV: 92 fL (ref 79–97)
Monocytes Absolute: 0.8 10*3/uL (ref 0.1–0.9)
Monocytes: 6 %
NEUTROS ABS: 11 10*3/uL — AB (ref 1.4–7.0)
Neutrophils: 84 %
Platelets: 318 10*3/uL (ref 150–450)
RBC: 4.44 x10E6/uL (ref 3.77–5.28)
RDW: 13 % (ref 12.3–15.4)
WBC: 13.2 10*3/uL — ABNORMAL HIGH (ref 3.4–10.8)

## 2017-09-12 LAB — LIPID PANEL
CHOLESTEROL TOTAL: 123 mg/dL (ref 100–199)
Chol/HDL Ratio: 1.9 ratio (ref 0.0–4.4)
HDL: 65 mg/dL (ref >39)
LDL Calculated: 49 mg/dL (ref 0–99)
Triglycerides: 44 mg/dL (ref 0–149)
VLDL Cholesterol Cal: 9 mg/dL (ref 5–40)

## 2017-09-12 LAB — HEMOGLOBIN A1C
Est. average glucose Bld gHb Est-mCnc: 88 mg/dL
HEMOGLOBIN A1C: 4.7 % — AB (ref 4.8–5.6)

## 2017-09-12 LAB — VITAMIN D 25 HYDROXY (VIT D DEFICIENCY, FRACTURES): VIT D 25 HYDROXY: 50.8 ng/mL (ref 30.0–100.0)

## 2017-09-12 LAB — VITAMIN B12: Vitamin B-12: <150 pg/mL — ABNORMAL LOW (ref 232–1245)

## 2017-09-14 ENCOUNTER — Other Ambulatory Visit: Payer: Self-pay | Admitting: Family Medicine

## 2017-09-14 DIAGNOSIS — J4520 Mild intermittent asthma, uncomplicated: Secondary | ICD-10-CM

## 2017-09-16 ENCOUNTER — Encounter: Payer: Self-pay | Admitting: Family Medicine

## 2017-09-18 ENCOUNTER — Ambulatory Visit (INDEPENDENT_AMBULATORY_CARE_PROVIDER_SITE_OTHER): Payer: BLUE CROSS/BLUE SHIELD

## 2017-09-18 DIAGNOSIS — E538 Deficiency of other specified B group vitamins: Secondary | ICD-10-CM | POA: Diagnosis not present

## 2017-09-18 MED ORDER — CYANOCOBALAMIN 1000 MCG/ML IJ SOLN
1000.0000 ug | INTRAMUSCULAR | Status: DC
  Administered 2017-09-18 – 2018-03-11 (×3): 1000 ug via INTRAMUSCULAR

## 2017-09-24 DIAGNOSIS — F900 Attention-deficit hyperactivity disorder, predominantly inattentive type: Secondary | ICD-10-CM | POA: Diagnosis not present

## 2017-09-25 ENCOUNTER — Other Ambulatory Visit: Payer: Self-pay | Admitting: Family Medicine

## 2017-09-25 DIAGNOSIS — E119 Type 2 diabetes mellitus without complications: Secondary | ICD-10-CM

## 2017-10-02 ENCOUNTER — Ambulatory Visit (INDEPENDENT_AMBULATORY_CARE_PROVIDER_SITE_OTHER): Payer: BLUE CROSS/BLUE SHIELD

## 2017-10-02 DIAGNOSIS — E538 Deficiency of other specified B group vitamins: Secondary | ICD-10-CM | POA: Diagnosis not present

## 2017-10-02 MED ORDER — CYANOCOBALAMIN 1000 MCG/ML IJ SOLN
1000.0000 ug | Freq: Once | INTRAMUSCULAR | Status: AC
  Administered 2017-10-02: 1000 ug via INTRAMUSCULAR

## 2017-10-02 NOTE — Progress Notes (Signed)
Patient came in for her B-12 injection. She tolerated it well. NKDA.   

## 2017-10-07 DIAGNOSIS — F411 Generalized anxiety disorder: Secondary | ICD-10-CM | POA: Diagnosis not present

## 2017-10-08 ENCOUNTER — Encounter: Payer: Self-pay | Admitting: Family Medicine

## 2017-10-09 ENCOUNTER — Encounter: Payer: Self-pay | Admitting: Family Medicine

## 2017-10-13 ENCOUNTER — Other Ambulatory Visit: Payer: Self-pay | Admitting: Family Medicine

## 2017-10-13 DIAGNOSIS — E559 Vitamin D deficiency, unspecified: Secondary | ICD-10-CM

## 2017-10-18 DIAGNOSIS — N200 Calculus of kidney: Secondary | ICD-10-CM | POA: Diagnosis not present

## 2017-10-18 DIAGNOSIS — N132 Hydronephrosis with renal and ureteral calculous obstruction: Secondary | ICD-10-CM | POA: Diagnosis not present

## 2017-10-22 DIAGNOSIS — R300 Dysuria: Secondary | ICD-10-CM | POA: Diagnosis not present

## 2017-10-22 DIAGNOSIS — N200 Calculus of kidney: Secondary | ICD-10-CM | POA: Insufficient documentation

## 2017-10-22 DIAGNOSIS — Z87891 Personal history of nicotine dependence: Secondary | ICD-10-CM | POA: Diagnosis not present

## 2017-10-22 DIAGNOSIS — Z9889 Other specified postprocedural states: Secondary | ICD-10-CM | POA: Diagnosis not present

## 2017-10-22 DIAGNOSIS — Z6824 Body mass index (BMI) 24.0-24.9, adult: Secondary | ICD-10-CM | POA: Diagnosis not present

## 2017-10-22 DIAGNOSIS — Z9289 Personal history of other medical treatment: Secondary | ICD-10-CM | POA: Diagnosis not present

## 2017-10-22 DIAGNOSIS — Z9049 Acquired absence of other specified parts of digestive tract: Secondary | ICD-10-CM | POA: Diagnosis not present

## 2017-10-22 DIAGNOSIS — N132 Hydronephrosis with renal and ureteral calculous obstruction: Secondary | ICD-10-CM | POA: Diagnosis not present

## 2017-10-29 ENCOUNTER — Encounter: Payer: Self-pay | Admitting: Family Medicine

## 2017-10-29 ENCOUNTER — Other Ambulatory Visit: Payer: Self-pay | Admitting: Family Medicine

## 2017-10-29 DIAGNOSIS — F428 Other obsessive-compulsive disorder: Secondary | ICD-10-CM

## 2017-10-29 DIAGNOSIS — F411 Generalized anxiety disorder: Secondary | ICD-10-CM | POA: Insufficient documentation

## 2017-10-29 DIAGNOSIS — F429 Obsessive-compulsive disorder, unspecified: Secondary | ICD-10-CM | POA: Insufficient documentation

## 2017-10-29 DIAGNOSIS — F325 Major depressive disorder, single episode, in full remission: Secondary | ICD-10-CM

## 2017-10-30 ENCOUNTER — Ambulatory Visit (INDEPENDENT_AMBULATORY_CARE_PROVIDER_SITE_OTHER): Payer: BLUE CROSS/BLUE SHIELD

## 2017-10-30 DIAGNOSIS — E538 Deficiency of other specified B group vitamins: Secondary | ICD-10-CM | POA: Diagnosis not present

## 2017-10-30 NOTE — Patient Instructions (Signed)
Patient came in for her monthly B12 injection. Patient was given injection by Hollie Salk without any difficulties. Patient will return in 1 month.

## 2017-11-04 ENCOUNTER — Other Ambulatory Visit: Payer: Self-pay | Admitting: Family Medicine

## 2017-11-04 DIAGNOSIS — K219 Gastro-esophageal reflux disease without esophagitis: Secondary | ICD-10-CM

## 2017-11-06 DIAGNOSIS — N200 Calculus of kidney: Secondary | ICD-10-CM | POA: Diagnosis not present

## 2017-11-23 ENCOUNTER — Other Ambulatory Visit: Payer: Self-pay | Admitting: Family Medicine

## 2017-11-23 DIAGNOSIS — G43109 Migraine with aura, not intractable, without status migrainosus: Secondary | ICD-10-CM

## 2017-11-25 DIAGNOSIS — Z9049 Acquired absence of other specified parts of digestive tract: Secondary | ICD-10-CM | POA: Diagnosis not present

## 2017-11-25 DIAGNOSIS — Z79899 Other long term (current) drug therapy: Secondary | ICD-10-CM | POA: Diagnosis not present

## 2017-11-25 DIAGNOSIS — Z01818 Encounter for other preprocedural examination: Secondary | ICD-10-CM | POA: Diagnosis not present

## 2017-11-25 DIAGNOSIS — N132 Hydronephrosis with renal and ureteral calculous obstruction: Secondary | ICD-10-CM | POA: Diagnosis not present

## 2017-11-25 DIAGNOSIS — Z885 Allergy status to narcotic agent status: Secondary | ICD-10-CM | POA: Diagnosis not present

## 2017-11-25 DIAGNOSIS — Z9884 Bariatric surgery status: Secondary | ICD-10-CM | POA: Diagnosis not present

## 2017-11-25 DIAGNOSIS — N200 Calculus of kidney: Secondary | ICD-10-CM | POA: Diagnosis not present

## 2017-11-25 DIAGNOSIS — Z9109 Other allergy status, other than to drugs and biological substances: Secondary | ICD-10-CM | POA: Diagnosis not present

## 2017-11-25 MED ORDER — FLUCONAZOLE 150 MG PO TABS: 150.0000 mg | ORAL_TABLET | ORAL | 0 refills | Status: DC

## 2017-11-25 MED ORDER — PROMETHAZINE HCL 12.5 MG PO TABS: ORAL_TABLET | ORAL | 0 refills | Status: DC

## 2017-11-25 NOTE — Telephone Encounter (Signed)
Refill request for general medication: Diflucan 150 mg and Promethazine 12.5 mg  Last office visit: 09/04/2017  Last physical exam: None indicated  Follow-ups on file. 12/06/2017

## 2017-11-28 ENCOUNTER — Other Ambulatory Visit: Payer: Self-pay | Admitting: Family Medicine

## 2017-11-28 DIAGNOSIS — F5104 Psychophysiologic insomnia: Secondary | ICD-10-CM

## 2017-11-29 MED ORDER — ZOLPIDEM TARTRATE 10 MG PO TABS: ORAL_TABLET | ORAL | 0 refills | Status: DC

## 2017-12-02 ENCOUNTER — Encounter: Payer: Self-pay | Admitting: Family Medicine

## 2017-12-02 ENCOUNTER — Ambulatory Visit: Payer: BLUE CROSS/BLUE SHIELD | Admitting: Family Medicine

## 2017-12-02 VITALS — BP 116/72 | HR 80 | Temp 98.5°F | Resp 14 | Ht 61.0 in | Wt 126.3 lb

## 2017-12-02 DIAGNOSIS — E559 Vitamin D deficiency, unspecified: Secondary | ICD-10-CM

## 2017-12-02 DIAGNOSIS — Z23 Encounter for immunization: Secondary | ICD-10-CM | POA: Diagnosis not present

## 2017-12-02 DIAGNOSIS — K219 Gastro-esophageal reflux disease without esophagitis: Secondary | ICD-10-CM | POA: Diagnosis not present

## 2017-12-02 DIAGNOSIS — E538 Deficiency of other specified B group vitamins: Secondary | ICD-10-CM | POA: Diagnosis not present

## 2017-12-02 DIAGNOSIS — Z9884 Bariatric surgery status: Secondary | ICD-10-CM

## 2017-12-02 DIAGNOSIS — E119 Type 2 diabetes mellitus without complications: Secondary | ICD-10-CM | POA: Diagnosis not present

## 2017-12-02 DIAGNOSIS — D72829 Elevated white blood cell count, unspecified: Secondary | ICD-10-CM

## 2017-12-02 DIAGNOSIS — F5104 Psychophysiologic insomnia: Secondary | ICD-10-CM

## 2017-12-02 DIAGNOSIS — I700 Atherosclerosis of aorta: Secondary | ICD-10-CM

## 2017-12-02 DIAGNOSIS — N200 Calculus of kidney: Secondary | ICD-10-CM

## 2017-12-02 DIAGNOSIS — F325 Major depressive disorder, single episode, in full remission: Secondary | ICD-10-CM

## 2017-12-02 MED ORDER — VITAMIN D (ERGOCALCIFEROL) 1.25 MG (50000 UNIT) PO CAPS: 50000.0000 [IU] | ORAL_CAPSULE | ORAL | 1 refills | Status: DC

## 2017-12-02 MED ORDER — CYANOCOBALAMIN 1000 MCG/ML IJ SOLN
1000.0000 ug | Freq: Once | INTRAMUSCULAR | Status: DC
  Administered 2017-12-02: 1000 ug via INTRAMUSCULAR

## 2017-12-02 MED ORDER — LANSOPRAZOLE 30 MG PO CPDR: DELAYED_RELEASE_CAPSULE | ORAL | 1 refills | Status: DC

## 2017-12-02 NOTE — Progress Notes (Signed)
Name: Claire [name]   MRN: [id number]    DOB: 10/09/1970   Date:12/02/2017       Progress Note  Subjective  Chief Complaint  Chief Complaint  Patient presents with  . Follow-up    3 mth f/u  . Diabetes  . Insomnia  . Gastroesophageal Reflux  . Asthma  . Depression  . Anxiety  . vitamin D deficiency  . iron deficiency anemia    HPI  Major Depression: she stopped medication months ago -SSRI, medications was making numb. She denies sadness or lack of motivation, seen by therapist and diagnosed with OCD and GAD and has follow up with Dr. Nicolasa Ducking in a couple of weeks.   Insomnia:she has been taking Ambien every night again. She needs to have Tevan brand and gets it though mail order.She takes medication before brushing her teeth and husband states that she goes cleaning before getting in bed.   Asthma MildIntermittentt:she is doing well on singulair, uses albuterol prn only and has been doing well, symptoms are worse during the Spring. She denies coughing or wheezing at this time.   Migraine: she is still seeing Dr. Domingo Cocking - now off Topamax and is on Zonegran, and is doing well at this time, no pain at this time. Migraines about 3 times a month, she is taking Imitrex prn.   DMII: diagnosed at age 8, took medication for a while, but stopped all medications 10/25/2010 after bariatric surgery,she has not been checking glucose at home,no polyphagia, polyuria or polydipsia. Maximum weight of 265 lbs and is down to 118.3lbs - lowest weight back in 08/2017), she has gained some weight since trying to eat a little more. Still on Ozempic.   GERD:taking Prevacid, usually Mylan brand, however local pharmacy dispensed a different type of medication but she has not started it yet. She denies any epigastric pain or regurgitation.   Back pain: seen by [facility], and is seeing Dr. Dorcas Mcmurray, she had NCS/EMG by Dr. Domingo Cocking and it was normal, Dr. Dorcas Mcmurray did NCS/EMG on upper extremity  and she has carpal tunnel bilaterally. No bowel or bladder incontinence. She is off Lyrica , pain today is zero.  Currently seeing urologist and will have surgery to remove kidney stones and back pain may resolve. Pain at this time is 5/10  Nephrolithiasis: she will have surgery for removal on right side.    Patient Active Problem List   Diagnosis Date Noted  . OCD (obsessive compulsive disorder) 10/29/2017  . GAD (generalized anxiety disorder) 10/29/2017  . Nephrolithiasis 10/22/2017  . Atherosclerosis of aorta ([place]) 12/21/2016  . Sacroiliac inflammation ([place]) 11/19/2016  . Esophageal hiatal hernia 08/17/2016  . Diverticulosis of colon 08/17/2016  . Iron deficiency anemia 08/08/2015  . Lumbosacral pain 01/07/2015  . Muscle twitching 01/07/2015  . Intertrigo 11/05/2014  . Perennial allergic rhinitis 09/03/2014  . Lung nodules 09/03/2014  . Vitamin D deficiency 09/03/2014  . Diet-controlled diabetes mellitus ([place]) 09/03/2014  . History of kidney stones 09/03/2014  . Chronic insomnia 09/03/2014  . History of iron deficiency anemia 09/03/2014  . Depression with anxiety 09/03/2014  . Palpitations 06/22/2014  . History of bariatric surgery 12/03/2013  . Asthma, mild persistent 11/21/2012  . CN (constipation) 11/21/2012  . GERD without esophagitis 11/21/2012  . History of hyperlipidemia 11/21/2012  . Migraine with aura and without status migrainosus 11/21/2012  . History of sleep apnea 11/21/2012  . IBS (irritable bowel syndrome) 08/15/2012    Past Surgical History:  Procedure Laterality Date  .  BLADDER SURGERY  2010  . BREAST BIOPSY Right 2014   stereotatic biopsy  . CHOLECYSTECTOMY  2010  . COLONOSCOPY  2014    Done at [facility]  . DILITATION & CURRETTAGE/HYSTROSCOPY WITH NOVASURE ABLATION N/A 06/16/2015   Procedure: DILATATION & CURETTAGE/HYSTEROSCOPY WITH NOVASURE ABLATION;  Surgeon: Brien Few, MD;  Location: [facility];  Service: Gynecology;  Laterality: N/A;  .  [place] RESECTION  2012  . LAPAROSCOPY N/A 06/16/2015   Procedure: LAPAROSCOPY DIAGNOSTIC;  Surgeon: Brien Few, MD;  Location: [facility];  Service: Gynecology;  Laterality: N/A;  . LYSIS OF ADHESION N/A 06/16/2015   Procedure: LYSIS OF ADHESION;  Surgeon: Brien Few, MD;  Location: [facility];  Service: Gynecology;  Laterality: N/A;  . PLANTAR FASCIA SURGERY Bilateral   . ROBOTIC ASSISTED SALPINGO OOPHERECTOMY Right 06/16/2015   Procedure: ROBOTIC ASSISTED SALPINGO OOPHORECTOMY, EXCISION OF RIGHT CUL DE [place] MASS;  Surgeon: Brien Few, MD;  Location: [facility];  Service: Gynecology;  Laterality: Right;  . TONSILLECTOMY    . UPPER GI ENDOSCOPY      Family History  Problem Relation Age of Onset  . Heart attack Mother   . Stroke Mother   . Multiple myeloma Mother   . Hyperlipidemia Father   . Heart attack Sister   . Heart attack Maternal Uncle   . Heart attack Paternal Grandfather   . Breast cancer Paternal Aunt   . Breast cancer Paternal Grandmother   . Lung cancer Maternal Grandfather     Social History   Socioeconomic History  . Marital status: Married    Spouse name: Not on file  . Number of children: 3  . Years of education: Not on file  . Highest education level: Some college, no degree  Occupational History  . Not on file  Social Needs  . Financial resource strain: Not very hard  . Food insecurity:    Worry: Never true    Inability: Never true  . Transportation needs:    Medical: No    Non-medical: No  Tobacco Use  . Smoking status: Former Smoker    Packs/day: 1.00    Years: 10.00    Pack years: 10.00    Types: Cigarettes    Last attempt to quit: 02/19/1993    Years since quitting: 24.8  . Smokeless tobacco: Never Used  Substance and Sexual Activity  . Alcohol use: Yes    Alcohol/week: 0.0 standard drinks    Comment: occasionally  . Drug use: No  . Sexual activity: Yes    Partners: Male  Lifestyle  . Physical activity:    Days per  week: 2 days    Minutes per session: 30 min  . Stress: Very much  Relationships  . Social connections:    Talks on phone: Not on file    Gets together: Not on file    Attends religious service: Not on file    Active member of club or organization: Not on file    Attends meetings of clubs or organizations: Not on file    Relationship status: Not on file  . Intimate partner violence:    Fear of current or ex partner: No    Emotionally abused: No    Physically abused: No    Forced sexual activity: No  Other Topics Concern  . Not on file  Social History Narrative  . Not on file     Current Outpatient Medications:  .  albuterol (PROVENTIL HFA;VENTOLIN HFA) 108 (90 Base) MCG/ACT  inhaler, TAKE 2 PUFFS EVERY 6 HOURS AS NEEDED FOR WHEEZING OR SHORTNESS OF BREATH. (VENTOLIN NOT COVERED), Disp: 8.5 Inhaler, Rfl: 2 .  ALPRAZolam (XANAX) 0.5 MG tablet, Take 1 tablet (0.5 mg total) by mouth at bedtime as needed for anxiety., Disp: 15 tablet, Rfl: 0 .  Ascorbic Acid (VITAMIN C) 1000 MG tablet, Take 1,000 mg by mouth., Disp: , Rfl:  .  aspirin EC 81 MG tablet, Take 81 mg by mouth daily., Disp: , Rfl:  .  atorvastatin (LIPITOR) 10 MG tablet, Take 1 tablet (10 mg total) by mouth daily., Disp: 90 tablet, Rfl: 3 .  calcium carbonate (OS-CAL) 600 MG TABS tablet, Take 600 mg by mouth daily with breakfast., Disp: , Rfl:  .  clobetasol ointment (TEMOVATE) 0.05 %, , Disp: , Rfl: 0 .  ferrous sulfate 325 (65 FE) MG EC tablet, Take 325 mg by mouth daily with breakfast. , Disp: , Rfl:  .  fluconazole (DIFLUCAN) 150 MG tablet, Take 1 tablet (150 mg total) by mouth every other day., Disp: 3 tablet, Rfl: 0 .  gentamicin ointment (GARAMYCIN) 0.1 %, APP IN NOSE TID PRF NASAL DRYNESS UTD, Disp: , Rfl: 0 .  lansoprazole (PREVACID) 30 MG capsule, TAKE 1 CAPSULE DAILY, Disp: 90 capsule, Rfl: 1 .  loratadine (CLARITIN) 10 MG tablet, Take 10 mg by mouth daily., Disp: , Rfl:  .  meclizine (ANTIVERT) 25 MG tablet, TK 1 T  PO Q 8 H PRF VERTIGO, Disp: , Rfl: 10 .  mometasone (ELOCON) 0.1 % lotion, APP TO ITCHY EARS BID FOR 12 DAYS. MAY REPEAT PRF ITCHY EARS, Disp: , Rfl: 4 .  mometasone (NASONEX) 50 MCG/ACT nasal spray, USE 2 SPRAYS IN EACH NOSTRIL AT BEDTIME, Disp: 48 g, Rfl: 1 .  montelukast (SINGULAIR) 10 MG tablet, TAKE 1 TABLET(10 MG) BY MOUTH AT BEDTIME, Disp: 90 tablet, Rfl: 1 .  nystatin-triamcinolone ointment (MYCOLOG), Apply 1 application 2 (two) times daily topically. Mix at home with A&D ointment apply with each bowel movement, Disp: 60 g, Rfl: 0 .  Potassium 99 MG TABS, Take by mouth., Disp: , Rfl:  .  promethazine (PHENERGAN) 12.5 MG tablet, TAKE 1 TABLET BY MOUTH EVERY 6 HOURS AS NEEDED FOR MIGRAINES WITH NAUSEA, Disp: 20 tablet, Rfl: 0 .  Semaglutide (OZEMPIC) 1 MG/DOSE SOPN, Inject 1 mg into the skin once a week., Disp: 9 mL, Rfl: 1 .  SUMAtriptan (IMITREX) 100 MG tablet, No more than 2 in 24 hours, Disp: 9 tablet, Rfl: 1 .  vitamin B-12 (CYANOCOBALAMIN) 100 MCG tablet, Take 100 mcg by mouth as directed. , Disp: , Rfl:  .  Vitamin D, Ergocalciferol, (DRISDOL) 50000 units CAPS capsule, Take 1 capsule (50,000 Units total) by mouth every 7 (seven) days. TAKE 1 CAPSULE EVERY 7 DAYS, Disp: 12 capsule, Rfl: 1 .  zolpidem (AMBIEN) 10 MG tablet, Must be [employer], Disp: 90 tablet, Rfl: 0 .  zonisamide (ZONEGRAN) 50 MG capsule, Take 3 capsules by mouth daily., Disp: , Rfl: 2 .  cyclobenzaprine (FLEXERIL) 10 MG tablet, Take 1 tablet (10 mg total) by mouth at bedtime. Prn, Disp: 90 tablet, Rfl: 1 .  LYRICA 50 MG capsule, Take 1 capsule by mouth daily., Disp: , Rfl:   Current Facility-Administered Medications:  .  cyanocobalamin ((VITAMIN B-12)) injection 1,000 mcg, 1,000 mcg, Intramuscular, Q30 days, Steele Sizer, MD, 1,000 mcg at 10/30/17 1334  Allergies  Allergen Reactions  . Gabapentin     Bladder incontinence at higher dose  .  Linzess [Linaclotide]     Worsening of diarrhea  . Other Hives and  Other (See Comments)    Ricotta Cheese: flushed and hot Ricotta Cheese: flushed and hot Ricotta Cheese: flushed and hot  . Cymbalta [Duloxetine Hcl] Palpitations    And photosensitivity  . Vicodin [Hydrocodone-Acetaminophen] Itching    I personally reviewed active problem list, medication list, allergies, family history, social history with the patient/caregiver today.   ROS  Constitutional: Negative for fever, positive for  weight change.  Respiratory: Negative for cough and shortness of breath.   Cardiovascular: Negative for chest pain or palpitations.  Gastrointestinal: Negative for abdominal pain, no bowel changes.  Musculoskeletal: Negative for gait problem or joint swelling.  Skin: Negative for rash.  Neurological: Negative for dizziness, positive for intermittent  headache.  No other specific complaints in a complete review of systems (except as listed in HPI above).   Objective  Vitals:   12/02/17 0843  BP: 116/72  Pulse: 80  Resp: 14  Temp: 98.5 F (36.9 C)  TempSrc: Oral  SpO2: 97%  Weight: 126 lb 4.8 oz (57.3 kg)  Height: _0  (1.549 m)    Body mass index is 23.86 kg/m.  Physical Exam  Constitutional: Patient appears well-developed and well-nourished. No distress.  HEENT: head atraumatic, normocephalic, pupils equal and reactive to light, neck supple, throat within normal limits Cardiovascular: Normal rate, regular rhythm and normal heart sounds.  No murmur heard. No BLE edema. Pulmonary/Chest: Effort normal and breath sounds normal. No respiratory distress. Abdominal: Soft.  There is no tenderness. Skin: erythema face - recent sunburn  Psychiatric: Patient has a normal mood and affect. behavior is normal. Judgment and thought content normal.  Recent Results (from the past 2160 hour(s))  POCT urinalysis dipstick     Status: Abnormal   Collection Time: 09/04/17  3:28 PM  Result Value Ref Range   Color, UA yellow    Clarity, UA clear    Glucose, UA  Negative Negative   Bilirubin, UA negative    Ketones, UA negative    Spec Grav, UA 1.010 1.010 - 1.025   Blood, UA negative    pH, UA 7.5 5.0 - 8.0   Protein, UA Positive (A) Negative   Urobilinogen, UA 0.2 0.2 or 1.0 E.U./dL   Nitrite, UA negative    Leukocytes, UA Small (1+) (A) Negative   Appearance clear    Odor none   CULTURE, URINE COMPREHENSIVE     Status: Abnormal   Collection Time: 09/04/17  4:25 PM  Result Value Ref Range   MICRO NUMBER: [id number]    SPECIMEN QUALITY: ADEQUATE    Source OTHER (SPECIFY)    STATUS: FINAL    ISOLATE 1: Lactobacillus species (A)     Comment: Greater than 100,000 CFU/mL of Lactobacillus species May represent colonizers from external and internal genitalia. No further testing (including susceptibility) will be performed.  CBC with Differential/Platelet     Status: Abnormal   Collection Time: 09/11/17  8:12 AM  Result Value Ref Range   WBC 13.2 (H) 3.4 - 10.8 x10E3/uL   RBC 4.44 3.77 - 5.28 x10E6/uL   Hemoglobin 13.3 11.1 - 15.9 g/dL   Hematocrit 40.7 34.0 - 46.6 %   MCV 92 79 - 97 fL   MCH 30.0 26.6 - 33.0 pg   MCHC 32.7 31.5 - 35.7 g/dL   RDW 13.0 12.3 - 15.4 %   Platelets 318 150 - 450 x10E3/uL   Neutrophils 84 Not Estab. %  Lymphs 10 Not Estab. %   Monocytes 6 Not Estab. %   Eos 0 Not Estab. %   Basos 0 Not Estab. %   Neutrophils Absolute 11.0 (H) 1.4 - 7.0 x10E3/uL   Lymphocytes Absolute 1.3 0.7 - 3.1 x10E3/uL   Monocytes Absolute 0.8 0.1 - 0.9 x10E3/uL   EOS (ABSOLUTE) 0.0 0.0 - 0.4 x10E3/uL   Basophils Absolute 0.0 0.0 - 0.2 x10E3/uL   Immature Granulocytes 0 Not Estab. %   Immature Grans (Abs) 0.0 0.0 - 0.1 x10E3/uL  Comprehensive metabolic panel     Status: Abnormal   Collection Time: 09/11/17  8:12 AM  Result Value Ref Range   Glucose 87 65 - 99 mg/dL   BUN 8 6 - 24 mg/dL   Creatinine, Ser 0.61 0.57 - 1.00 mg/dL   GFR calc non Af Amer 110 >59 mL/min/1.73   GFR calc Af Amer 127 >59 mL/min/1.73   BUN/Creatinine Ratio  13 9 - 23   Sodium 140 134 - 144 mmol/L   Potassium 3.7 3.5 - 5.2 mmol/L   Chloride 108 (H) 96 - 106 mmol/L   CO2 19 (L) 20 - 29 mmol/L   Calcium 9.1 8.7 - 10.2 mg/dL   Total Protein 6.3 6.0 - 8.5 g/dL   Albumin 4.1 3.5 - 5.5 g/dL   Globulin, Total 2.2 1.5 - 4.5 g/dL   Albumin/Globulin Ratio 1.9 1.2 - 2.2   Bilirubin Total 0.5 0.0 - 1.2 mg/dL   Alkaline Phosphatase 56 39 - 117 IU/L   AST 13 0 - 40 IU/L   ALT 14 0 - 32 IU/L  Lipid panel     Status: None   Collection Time: 09/11/17  8:12 AM  Result Value Ref Range   Cholesterol, Total 123 100 - 199 mg/dL   Triglycerides 44 0 - 149 mg/dL   HDL 65 >39 mg/dL   VLDL Cholesterol Cal 9 5 - 40 mg/dL   LDL Calculated 49 0 - 99 mg/dL   Chol/HDL Ratio 1.9 0.0 - 4.4 ratio    Comment:                                   T. Chol/HDL Ratio                                             Men  Women                               1/2 Avg.Risk  3.4    3.3                                   Avg.Risk  5.0    4.4                                2X Avg.Risk  9.6    7.1                                3X Avg.Risk 23.4   11.0   Vitamin B12  Status: Abnormal   Collection Time: 09/11/17  8:12 AM  Result Value Ref Range   Vitamin B-12 <150 (L) 232 - 1245 pg/mL  VITAMIN D 25 Hydroxy (Vit-D Deficiency, Fractures)     Status: None   Collection Time: 09/11/17  8:12 AM  Result Value Ref Range   Vit D, 25-Hydroxy 50.8 30.0 - 100.0 ng/mL    Comment: Vitamin D deficiency has been defined by the [place] practice guideline as a level of serum 25-OH vitamin D less than 20 ng/mL (1,2). The Endocrine Society went on to further define vitamin D insufficiency as a level between 21 and 29 ng/mL (2). 1. IOM (Institute of Medicine). 2010. Dietary reference    intakes for calcium and D. [place]: The    [employer]. 2. Holick MF, Binkley Humbird, Bischoff-Ferrari HA, et al.    Evaluation, treatment, and prevention of  vitamin D    deficiency: an Endocrine Society clinical practice    guideline. JCEM. 2011 Jul; 96(7):1911-30.   Hemoglobin A1c     Status: Abnormal   Collection Time: 09/11/17  8:12 AM  Result Value Ref Range   Hgb A1c MFr Bld 4.7 (L) 4.8 - 5.6 %    Comment:          Prediabetes: 5.7 - 6.4          Diabetes: >6.4          Glycemic control for adults with diabetes: <7.0    Est. average glucose Bld gHb Est-mCnc 88 mg/dL    Diabetic Foot Exam: Diabetic Foot Exam - Simple   Simple Foot Form Diabetic Foot exam was performed with the following findings:  Yes 12/02/2017  9:07 AM  Visual Inspection No deformities, no ulcerations, no other skin breakdown bilaterally:  Yes Sensation Testing Intact to touch and monofilament testing bilaterally:  Yes Pulse Check Posterior Tibialis and Dorsalis pulse intact bilaterally:  Yes Comments      PHQ2/9: Depression screen [facility] 2/9 12/02/2017 09/04/2017 05/22/2017 02/20/2017 06/18/2016  Decreased Interest 0 0 0 0 0  Down, Depressed, Hopeless 0 0 0 0 0  PHQ - 2 Score 0 0 0 0 0  Altered sleeping 0 0 0 - -  Tired, decreased energy 0 0 3 - -  Change in appetite 0 0 0 - -  Feeling bad or failure about yourself  0 0 0 - -  Trouble concentrating 0 0 3 - -  Moving slowly or fidgety/restless 0 0 0 - -  Suicidal thoughts 0 0 0 - -  PHQ-9 Score 0 0 6 - -  Difficult doing work/chores Not difficult at all Not difficult at all Not difficult at all - -    Fall Risk: Fall Risk  12/02/2017 09/04/2017 05/22/2017 02/20/2017 12/31/2016  Falls in the past year? _0     Functional Status Survey: Is the patient deaf or have difficulty hearing?: No Does the patient have difficulty seeing, even when wearing glasses/contacts?: No Does the patient have difficulty concentrating, remembering, or making decisions?: No Does the patient have difficulty walking or climbing stairs?: No Does the patient have difficulty dressing or bathing?: No Does the patient have  difficulty doing errands alone such as visiting a doctor's office or shopping?: No    Assessment & Plan   1. GERD without esophagitis  - lansoprazole (PREVACID) 30 MG capsule; TAKE 1 CAPSULE DAILY  Dispense: 90 capsule; Refill: 1  2. Need for  influenza vaccination  - Flu Vaccine QUAD 6+ mos PF IM (Fluarix Quad PF)  3. B12 deficiency  - B12 today IM   4. Vitamin D deficiency  - Vitamin D, Ergocalciferol, (DRISDOL) 50000 units CAPS capsule; Take 1 capsule (50,000 Units total) by mouth every 7 (seven) days. TAKE 1 CAPSULE EVERY 7 DAYS  Dispense: 12 capsule; Refill: 1  5. History of bariatric surgery  Gained some weight, but trying to eat a little more lately   6. Major depression in remission [facility])  Doing well in terms of depression, however because of OCD and GAD she has an appointment with Dr. Nicolasa Ducking   7. Atherosclerosis of aorta ([place])   On statin therapy  8. Chronic insomnia  Refill Ambien sent yesterday   9. Diet-controlled diabetes mellitus ([place])  - POCT UA - Microalbumin Continue Ozempic   10. Leukocytosis, unspecified type  - CBC with Differential/Platelet   11. Nephrolithiasis  Having surgery soon

## 2017-12-03 LAB — MICROALBUMIN / CREATININE URINE RATIO
Creatinine, Urine: 105 mg/dL (ref 20–275)
MICROALB/CREAT RATIO: 5 ug/mg{creat} (ref <30)
Microalb, Ur: 0.5 mg/dL

## 2017-12-04 ENCOUNTER — Other Ambulatory Visit: Payer: Self-pay | Admitting: Family Medicine

## 2017-12-04 DIAGNOSIS — J4520 Mild intermittent asthma, uncomplicated: Secondary | ICD-10-CM

## 2017-12-05 ENCOUNTER — Encounter: Payer: Self-pay | Admitting: Family Medicine

## 2017-12-06 ENCOUNTER — Ambulatory Visit: Payer: BLUE CROSS/BLUE SHIELD | Admitting: Family Medicine

## 2017-12-10 DIAGNOSIS — R51 Headache: Secondary | ICD-10-CM | POA: Diagnosis not present

## 2017-12-10 DIAGNOSIS — M791 Myalgia, unspecified site: Secondary | ICD-10-CM | POA: Diagnosis not present

## 2017-12-10 DIAGNOSIS — G43839 Menstrual migraine, intractable, without status migrainosus: Secondary | ICD-10-CM | POA: Diagnosis not present

## 2017-12-10 DIAGNOSIS — G518 Other disorders of facial nerve: Secondary | ICD-10-CM | POA: Diagnosis not present

## 2017-12-10 DIAGNOSIS — G43019 Migraine without aura, intractable, without status migrainosus: Secondary | ICD-10-CM | POA: Diagnosis not present

## 2017-12-10 DIAGNOSIS — M542 Cervicalgia: Secondary | ICD-10-CM | POA: Diagnosis not present

## 2017-12-11 DIAGNOSIS — I739 Peripheral vascular disease, unspecified: Secondary | ICD-10-CM | POA: Diagnosis not present

## 2017-12-11 DIAGNOSIS — I2510 Atherosclerotic heart disease of native coronary artery without angina pectoris: Secondary | ICD-10-CM | POA: Diagnosis not present

## 2017-12-11 DIAGNOSIS — Z885 Allergy status to narcotic agent status: Secondary | ICD-10-CM | POA: Diagnosis not present

## 2017-12-11 DIAGNOSIS — Z7951 Long term (current) use of inhaled steroids: Secondary | ICD-10-CM | POA: Diagnosis not present

## 2017-12-11 DIAGNOSIS — Z79899 Other long term (current) drug therapy: Secondary | ICD-10-CM | POA: Diagnosis not present

## 2017-12-11 DIAGNOSIS — Z9049 Acquired absence of other specified parts of digestive tract: Secondary | ICD-10-CM | POA: Diagnosis not present

## 2017-12-11 DIAGNOSIS — Z886 Allergy status to analgesic agent status: Secondary | ICD-10-CM | POA: Diagnosis not present

## 2017-12-11 DIAGNOSIS — Z888 Allergy status to other drugs, medicaments and biological substances status: Secondary | ICD-10-CM | POA: Diagnosis not present

## 2017-12-11 DIAGNOSIS — Z9884 Bariatric surgery status: Secondary | ICD-10-CM | POA: Diagnosis not present

## 2017-12-11 DIAGNOSIS — N200 Calculus of kidney: Secondary | ICD-10-CM | POA: Diagnosis not present

## 2017-12-11 DIAGNOSIS — Z7982 Long term (current) use of aspirin: Secondary | ICD-10-CM | POA: Diagnosis not present

## 2017-12-11 HISTORY — PX: LITHOTRIPSY: SUR834

## 2017-12-19 DIAGNOSIS — F411 Generalized anxiety disorder: Secondary | ICD-10-CM | POA: Diagnosis not present

## 2017-12-19 DIAGNOSIS — F349 Persistent mood [affective] disorder, unspecified: Secondary | ICD-10-CM | POA: Diagnosis not present

## 2017-12-19 DIAGNOSIS — F5105 Insomnia due to other mental disorder: Secondary | ICD-10-CM | POA: Diagnosis not present

## 2017-12-19 DIAGNOSIS — F429 Obsessive-compulsive disorder, unspecified: Secondary | ICD-10-CM | POA: Diagnosis not present

## 2017-12-25 ENCOUNTER — Encounter: Payer: Self-pay | Admitting: Family Medicine

## 2018-01-03 DIAGNOSIS — E538 Deficiency of other specified B group vitamins: Secondary | ICD-10-CM | POA: Diagnosis not present

## 2018-01-03 DIAGNOSIS — D72829 Elevated white blood cell count, unspecified: Secondary | ICD-10-CM | POA: Diagnosis not present

## 2018-01-04 LAB — CBC WITH DIFFERENTIAL/PLATELET
BASOS ABS: 0 10*3/uL (ref 0.0–0.2)
Basos: 1 %
EOS (ABSOLUTE): 0.4 10*3/uL (ref 0.0–0.4)
Eos: 7 %
HEMOGLOBIN: 12.8 g/dL (ref 11.1–15.9)
Hematocrit: 40.2 % (ref 34.0–46.6)
IMMATURE GRANS (ABS): 0 10*3/uL (ref 0.0–0.1)
Immature Granulocytes: 0 %
LYMPHS: 25 %
Lymphocytes Absolute: 1.3 10*3/uL (ref 0.7–3.1)
MCH: 29.2 pg (ref 26.6–33.0)
MCHC: 31.8 g/dL (ref 31.5–35.7)
MCV: 92 fL (ref 79–97)
MONOCYTES: 8 %
Monocytes Absolute: 0.4 10*3/uL (ref 0.1–0.9)
NEUTROS PCT: 59 %
Neutrophils Absolute: 3.2 10*3/uL (ref 1.4–7.0)
PLATELETS: 297 10*3/uL (ref 150–450)
RBC: 4.38 x10E6/uL (ref 3.77–5.28)
RDW: 12.2 % — ABNORMAL LOW (ref 12.3–15.4)
WBC: 5.3 10*3/uL (ref 3.4–10.8)

## 2018-01-04 LAB — VITAMIN B12: Vitamin B-12: 181 pg/mL — ABNORMAL LOW (ref 232–1245)

## 2018-01-06 ENCOUNTER — Other Ambulatory Visit: Payer: Self-pay

## 2018-01-06 ENCOUNTER — Encounter (HOSPITAL_COMMUNITY): Payer: Self-pay | Admitting: *Deleted

## 2018-01-06 ENCOUNTER — Encounter: Payer: Self-pay | Admitting: Emergency Medicine

## 2018-01-06 ENCOUNTER — Inpatient Hospital Stay (HOSPITAL_COMMUNITY)
Admission: AD | Admit: 2018-01-06 | Discharge: 2018-01-07 | Disposition: A | Discharge status: Home / Self Care | Payer: BLUE CROSS/BLUE SHIELD | Source: Ambulatory Visit | Attending: Obstetrics and Gynecology | Admitting: Obstetrics and Gynecology

## 2018-01-06 ENCOUNTER — Emergency Department: Payer: BLUE CROSS/BLUE SHIELD

## 2018-01-06 ENCOUNTER — Emergency Department
Admission: EM | Admit: 2018-01-06 | Discharge: 2018-01-06 | Disposition: A | Discharge status: Home / Self Care | Payer: BLUE CROSS/BLUE SHIELD | Attending: Emergency Medicine | Admitting: Emergency Medicine

## 2018-01-06 DIAGNOSIS — Z7982 Long term (current) use of aspirin: Secondary | ICD-10-CM | POA: Insufficient documentation

## 2018-01-06 DIAGNOSIS — R1084 Generalized abdominal pain: Secondary | ICD-10-CM | POA: Diagnosis not present

## 2018-01-06 DIAGNOSIS — Z79899 Other long term (current) drug therapy: Secondary | ICD-10-CM | POA: Insufficient documentation

## 2018-01-06 DIAGNOSIS — Z87891 Personal history of nicotine dependence: Secondary | ICD-10-CM | POA: Insufficient documentation

## 2018-01-06 DIAGNOSIS — N200 Calculus of kidney: Secondary | ICD-10-CM | POA: Diagnosis not present

## 2018-01-06 DIAGNOSIS — E119 Type 2 diabetes mellitus without complications: Secondary | ICD-10-CM | POA: Diagnosis not present

## 2018-01-06 DIAGNOSIS — J45909 Unspecified asthma, uncomplicated: Secondary | ICD-10-CM | POA: Insufficient documentation

## 2018-01-06 DIAGNOSIS — D259 Leiomyoma of uterus, unspecified: Secondary | ICD-10-CM | POA: Insufficient documentation

## 2018-01-06 DIAGNOSIS — R1032 Left lower quadrant pain: Secondary | ICD-10-CM | POA: Diagnosis not present

## 2018-01-06 DIAGNOSIS — R109 Unspecified abdominal pain: Secondary | ICD-10-CM | POA: Diagnosis not present

## 2018-01-06 HISTORY — DX: Migraine, unspecified, not intractable, without status migrainosus: G43.909

## 2018-01-06 HISTORY — DX: Deficiency of other specified B group vitamins: E53.8

## 2018-01-06 LAB — URINALYSIS, ROUTINE W REFLEX MICROSCOPIC
BILIRUBIN URINE: NEGATIVE
GLUCOSE, UA: NEGATIVE mg/dL
Ketones, ur: NEGATIVE mg/dL
LEUKOCYTES UA: NEGATIVE
NITRITE: NEGATIVE
Protein, ur: NEGATIVE mg/dL
Specific Gravity, Urine: >1.046 — ABNORMAL HIGH (ref 1.005–1.030)
pH: 6 (ref 5.0–8.0)

## 2018-01-06 LAB — COMPREHENSIVE METABOLIC PANEL
ALBUMIN: 3.8 g/dL (ref 3.5–5.0)
ALT: 21 U/L (ref 0–44)
AST: 17 U/L (ref 15–41)
Alkaline Phosphatase: 61 U/L (ref 38–126)
Anion gap: 9 (ref 5–15)
BILIRUBIN TOTAL: 0.7 mg/dL (ref 0.3–1.2)
BUN: 8 mg/dL (ref 6–20)
CALCIUM: 8.8 mg/dL — AB (ref 8.9–10.3)
CO2: 25 mmol/L (ref 22–32)
CREATININE: 0.62 mg/dL (ref 0.44–1.00)
Chloride: 106 mmol/L (ref 98–111)
GFR calc Af Amer: >60 mL/min (ref >60)
GLUCOSE: 125 mg/dL — AB (ref 70–99)
Potassium: 3.9 mmol/L (ref 3.5–5.1)
Sodium: 140 mmol/L (ref 135–145)
TOTAL PROTEIN: 6.5 g/dL (ref 6.5–8.1)

## 2018-01-06 LAB — URINALYSIS, COMPLETE (UACMP) WITH MICROSCOPIC
Bacteria, UA: NONE SEEN
Bilirubin Urine: NEGATIVE
GLUCOSE, UA: NEGATIVE mg/dL
Ketones, ur: NEGATIVE mg/dL
Leukocytes, UA: NEGATIVE
Nitrite: NEGATIVE
PH: 6 (ref 5.0–8.0)
Protein, ur: NEGATIVE mg/dL
SQUAMOUS EPITHELIAL / LPF: NONE SEEN (ref 0–5)
Specific Gravity, Urine: 1.002 — ABNORMAL LOW (ref 1.005–1.030)
WBC, UA: NONE SEEN WBC/hpf (ref 0–5)

## 2018-01-06 LAB — CBC
HCT: 39.5 % (ref 36.0–46.0)
Hemoglobin: 12.6 g/dL (ref 12.0–15.0)
MCH: 30.4 pg (ref 26.0–34.0)
MCHC: 31.9 g/dL (ref 30.0–36.0)
MCV: 95.2 fL (ref 80.0–100.0)
PLATELETS: 262 10*3/uL (ref 150–400)
RBC: 4.15 MIL/uL (ref 3.87–5.11)
RDW: 13.5 % (ref 11.5–15.5)
WBC: 6.5 10*3/uL (ref 4.0–10.5)
nRBC: 0 % (ref 0.0–0.2)

## 2018-01-06 LAB — POCT PREGNANCY, URINE: Preg Test, Ur: NEGATIVE

## 2018-01-06 LAB — LIPASE, BLOOD: LIPASE: 25 U/L (ref 11–51)

## 2018-01-06 MED ORDER — DICYCLOMINE HCL 10 MG PO CAPS
20.0000 mg | ORAL_CAPSULE | Freq: Once | ORAL | Status: AC
  Administered 2018-01-06: 20 mg via ORAL
  Filled 2018-01-06: qty 2

## 2018-01-06 MED ORDER — IOPAMIDOL (ISOVUE-300) INJECTION 61%
100.0000 mL | Freq: Once | INTRAVENOUS | Status: AC | PRN
  Administered 2018-01-06: 100 mL via INTRAVENOUS
  Filled 2018-01-06: qty 100

## 2018-01-06 MED ORDER — SODIUM CHLORIDE 0.9 % IV BOLUS
1000.0000 mL | Freq: Once | INTRAVENOUS | Status: AC
  Administered 2018-01-06: 1000 mL via INTRAVENOUS

## 2018-01-06 MED ORDER — OXYCODONE-ACETAMINOPHEN 5-325 MG PO TABS
1.0000 | ORAL_TABLET | Freq: Once | ORAL | Status: AC
  Administered 2018-01-06: 1 via ORAL
  Filled 2018-01-06: qty 1

## 2018-01-06 NOTE — ED Notes (Signed)
Patient transported to CT 

## 2018-01-06 NOTE — ED Triage Notes (Signed)
Patient presents to the ED with abdominal pain in her LLQ that began around 10:30.  Patient states at 11:15 she had a bowel movement and noted a significant amount of red blood.  Patient states she put a pad in her underwear and has yet to have any bleeding on the pad.

## 2018-01-06 NOTE — Discharge Instructions (Signed)
Your labs and CT scan today were okay.  The CT scan does show a uterine fibroid which may be causing you severe menstrual symptoms.  Follow-up with your doctor to continue monitoring your symptoms.

## 2018-01-06 NOTE — MAU Note (Signed)
Pt reports severe lower abd pain and bleeding. Was seen in ED earlier today due to onset of vaginal bleeding. States u/s showed a fibroid. Since that visit her pain worsened. States she passed out due to pain.

## 2018-01-06 NOTE — ED Provider Notes (Signed)
[facility] Emergency Department Provider Note  ____________________________________________  Time seen: Approximately 4:41 PM  I have reviewed the triage vital signs and the nursing notes.   HISTORY  Chief Complaint Abdominal Pain    HPI Claire [name] is a 47 y.o. female with a history of anxiety depression diabetes IBS who comes to the ED complaining of left lower quadrant abdominal pain that started at about 10:30 AM today.  It is worsening throughout the day, constant, no aggravating or alleviating factors, nonradiating.  She reports onebowel movement where she noticed blood on the toilet paper when wiping, but otherwise denies dark stools.  No nausea or vomiting. She is not certain that the blood came from her stool and does note that she has been having menstrual symptoms recently.  She also notes that she has had body aches recently.  Patient notes that on colonoscopy a few years ago she was noted to have some diverticular disease.  Past Medical History:  Diagnosis Date  . Anemia   . Anxiety   . Asthma   . Bilateral leg cramps   . Depression   . Diabetes mellitus without complication ([place])    history no longer a problem since weight loss surgery  . DJD (degenerative joint disease)   . GERD (gastroesophageal reflux disease)   . Headache    Migraines  . IBS (irritable bowel syndrome)   . Kidney stone   . Low serum vitamin B12   . Lung nodules   . Migraine   . Muscle spasm   . Psoriasis    vaginal area  . Seasonal allergies   . Sleep apnea    history no longer a problem since weight loss surgery  . Vitamin D deficiency      Patient Active Problem List   Diagnosis Date Noted  . OCD (obsessive compulsive disorder) 10/29/2017  . GAD (generalized anxiety disorder) 10/29/2017  . Nephrolithiasis 10/22/2017  . Atherosclerosis of aorta ([place]) 12/21/2016  . Sacroiliac inflammation ([place]) 11/19/2016  . Esophageal hiatal hernia 08/17/2016   . Diverticulosis of colon 08/17/2016  . Iron deficiency anemia 08/08/2015  . Lumbosacral pain 01/07/2015  . Muscle twitching 01/07/2015  . Intertrigo 11/05/2014  . Perennial allergic rhinitis 09/03/2014  . Lung nodules 09/03/2014  . Vitamin D deficiency 09/03/2014  . Diet-controlled diabetes mellitus ([place]) 09/03/2014  . History of kidney stones 09/03/2014  . Chronic insomnia 09/03/2014  . History of iron deficiency anemia 09/03/2014  . Depression with anxiety 09/03/2014  . Palpitations 06/22/2014  . History of bariatric surgery 12/03/2013  . Asthma, mild persistent 11/21/2012  . CN (constipation) 11/21/2012  . GERD without esophagitis 11/21/2012  . History of hyperlipidemia 11/21/2012  . Migraine with aura and without status migrainosus 11/21/2012  . History of sleep apnea 11/21/2012  . IBS (irritable bowel syndrome) 08/15/2012     Past Surgical History:  Procedure Laterality Date  . BLADDER SURGERY  2010  . BREAST BIOPSY Right 2014   stereotatic biopsy  . CHOLECYSTECTOMY  2010  . COLONOSCOPY  2014    [facility] at [facility]  . DILITATION & CURRETTAGE/HYSTROSCOPY WITH NOVASURE ABLATION N/A 06/16/2015   Procedure: DILATATION & CURETTAGE/HYSTEROSCOPY WITH NOVASURE ABLATION;  Surgeon: Brien Few, MD;  Location: [facility];  Service: Gynecology;  Laterality: N/A;  . [place] RESECTION  2012  . LAPAROSCOPY N/A 06/16/2015   Procedure: LAPAROSCOPY DIAGNOSTIC;  Surgeon: Brien Few, MD;  Location: [facility];  Service: Gynecology;  Laterality: N/A;  . LYSIS  OF ADHESION N/A 06/16/2015   Procedure: LYSIS OF ADHESION;  Surgeon: Brien Few, MD;  Location: [facility];  Service: Gynecology;  Laterality: N/A;  . PLANTAR FASCIA SURGERY Bilateral   . ROBOTIC ASSISTED SALPINGO OOPHERECTOMY Right 06/16/2015   Procedure: ROBOTIC ASSISTED SALPINGO OOPHORECTOMY, EXCISION OF RIGHT CUL DE [place] MASS;  Surgeon: Brien Few, MD;  Location: [facility];  Service: Gynecology;  Laterality: Right;   . TONSILLECTOMY    . UPPER GI ENDOSCOPY       Prior to Admission medications   Medication Sig Start Date End Date Taking? Authorizing Provider  albuterol (PROVENTIL HFA;VENTOLIN HFA) 108 (90 Base) MCG/ACT inhaler TAKE 2 PUFFS EVERY 6 HOURS AS NEEDED FOR WHEEZING OR SHORTNESS OF BREATH. (VENTOLIN NOT COVERED) 12/04/17   Hubbard Hartshorn, FNP  ALPRAZolam Duanne Moron) 0.5 MG tablet Take 1 tablet (0.5 mg total) by mouth at bedtime as needed for anxiety. 09/04/17   Steele Sizer, MD  Ascorbic Acid (VITAMIN C) 1000 MG tablet Take 1,000 mg by mouth.    [provider]  aspirin EC 81 MG tablet Take 81 mg by mouth daily.    [provider]  atorvastatin (LIPITOR) 10 MG tablet Take 1 tablet (10 mg total) by mouth daily. 02/20/17   Steele Sizer, MD  calcium carbonate (OS-CAL) 600 MG TABS tablet Take 600 mg by mouth daily with breakfast.    [provider]  clobetasol ointment (TEMOVATE) 0.05 %  06/06/15   [provider]  cyclobenzaprine (FLEXERIL) 10 MG tablet Take 1 tablet (10 mg total) by mouth at bedtime. Prn 11/11/15   Steele Sizer, MD  ferrous sulfate 325 (65 FE) MG EC tablet Take 325 mg by mouth daily with breakfast.     [provider]  fluconazole (DIFLUCAN) 150 MG tablet Take 1 tablet (150 mg total) by mouth every other day. 11/25/17   Steele Sizer, MD  gentamicin ointment (GARAMYCIN) 0.1 % APP IN NOSE TID PRF NASAL DRYNESS UTD 01/11/17   Beverly Gust, MD  lansoprazole (PREVACID) 30 MG capsule TAKE 1 CAPSULE DAILY 12/02/17   Steele Sizer, MD  loratadine (CLARITIN) 10 MG tablet Take 10 mg by mouth daily.    [provider]  LYRICA 50 MG capsule Take 1 capsule by mouth daily. 04/05/16   Rance Muir, MD  meclizine (ANTIVERT) 25 MG tablet TK 1 T PO Q 8 H PRF VERTIGO 12/03/16   Beverly Gust, MD  mometasone (ELOCON) 0.1 % lotion APP TO ITCHY EARS BID FOR 12 DAYS. MAY REPEAT PRF ITCHY EARS 01/09/17   Beverly Gust, MD   mometasone (NASONEX) 50 MCG/ACT nasal spray USE 2 SPRAYS IN EACH NOSTRIL AT BEDTIME 08/18/17   Sowles, Drue Stager, MD  montelukast (SINGULAIR) 10 MG tablet TAKE 1 TABLET(10 MG) BY MOUTH AT BEDTIME 09/04/17   Steele Sizer, MD  nystatin-triamcinolone ointment [facility]) Apply 1 application 2 (two) times daily topically. Mix at home with A&D ointment apply with each bowel movement 12/31/16   Steele Sizer, MD  Potassium 99 MG TABS Take by mouth.    [provider]  promethazine (PHENERGAN) 12.5 MG tablet TAKE 1 TABLET BY MOUTH EVERY 6 HOURS AS NEEDED FOR MIGRAINES WITH NAUSEA 11/25/17   Sowles, Drue Stager, MD  Semaglutide (OZEMPIC) 1 MG/DOSE SOPN Inject 1 mg into the skin once a week. 09/04/17   Steele Sizer, MD  SUMAtriptan (IMITREX) 100 MG tablet No more than 2 in 24 hours 08/23/17   Steele Sizer, MD  vitamin B-12 (CYANOCOBALAMIN) 100 MCG tablet  Take 100 mcg by mouth as directed.     [provider]  Vitamin D, Ergocalciferol, (DRISDOL) 50000 units CAPS capsule Take 1 capsule (50,000 Units total) by mouth every 7 (seven) days. TAKE 1 CAPSULE EVERY 7 DAYS 12/02/17   Steele Sizer, MD  zolpidem (AMBIEN) 10 MG tablet Must be [employer] 11/29/17   Sowles, Drue Stager, MD  zonisamide (ZONEGRAN) 50 MG capsule Take 3 capsules by mouth daily. 02/10/16   Orie Rout, MD     Allergies Gabapentin; Linzess [linaclotide]; Other; Cymbalta [duloxetine hcl]; and Vicodin [hydrocodone-acetaminophen]   Family History  Problem Relation Age of Onset  . Heart attack Mother   . Stroke Mother   . Multiple myeloma Mother   . Hyperlipidemia Father   . Heart attack Sister   . Heart attack Maternal Uncle   . Heart attack Paternal Grandfather   . Breast cancer Paternal Aunt   . Breast cancer Paternal Grandmother   . Lung cancer Maternal Grandfather     Social History Social History   Tobacco Use  . Smoking status: Former Smoker    Packs/day: 1.00    Years: 10.00    Pack years:  10.00    Types: Cigarettes    Last attempt to quit: 02/19/1993    Years since quitting: 24.8  . Smokeless tobacco: Never Used  Substance Use Topics  . Alcohol use: Yes    Alcohol/week: 0.0 standard drinks    Comment: occasionally  . Drug use: No    Review of Systems  Constitutional:   No fever or chills.  ENT:   No sore throat. No rhinorrhea. Cardiovascular:   No chest pain or syncope. Respiratory:   No dyspnea or cough. Gastrointestinal: Positive as above for abdominal pain without vomiting or diarrhea.  Musculoskeletal:   Negative for focal pain or swelling All other systems reviewed and are negative except as documented above in ROS and HPI.  ____________________________________________   PHYSICAL EXAM:  VITAL SIGNS: ED Triage Vitals  Enc Vitals Group     BP 01/06/18 1300 132/72     Pulse Rate 01/06/18 1300 68     Resp 01/06/18 1300 18     Temp 01/06/18 1300 98.4 F (36.9 C)     Temp Source 01/06/18 1300 Oral     SpO2 01/06/18 1300 100 %     Weight 01/06/18 1301 132 lb (59.9 kg)     Height 01/06/18 1301 '5\' 1"'$  (1.549 m)     Head Circumference --      Peak Flow --      Pain Score 01/06/18 1300 8     Pain Loc --      Pain Edu? --      Excl. in [place]? --     Vital signs reviewed, nursing assessments reviewed.   Constitutional:   Alert and oriented. Non-toxic appearance. Eyes:   Conjunctivae are normal. EOMI. PERRL. ENT      Head:   Normocephalic and atraumatic.      Nose:   No congestion/rhinnorhea.       Mouth/Throat:   MMM, no pharyngeal erythema. No peritonsillar mass.       Neck:   No meningismus. Full ROM. Hematological/Lymphatic/Immunilogical:   No cervical lymphadenopathy. Cardiovascular:   RRR. Symmetric bilateral radial and DP pulses.  No murmurs. Cap refill less than 2 seconds. Respiratory:   Normal respiratory effort without tachypnea/retractions. Breath sounds are clear and equal bilaterally. No wheezes/rales/rhonchi. Gastrointestinal:   Soft with  generalized tenderness, nonfocal.  Non distended. There is no CVA tenderness.  No rebound, rigidity, or guarding. Musculoskeletal:   Normal range of motion in all extremities. No joint effusions.  No lower extremity tenderness.  No edema. Neurologic:   Normal speech and language.  Motor grossly intact. No acute focal neurologic deficits are appreciated.  Skin:    Skin is warm, dry and intact. No rash noted.  No petechiae, purpura, or bullae.  ____________________________________________    LABS (pertinent positives/negatives) (all labs ordered are listed, but only abnormal results are displayed) Labs Reviewed  COMPREHENSIVE METABOLIC PANEL - Abnormal; Notable for the following components:      Result Value   Glucose, Bld 125 (*)    Calcium 8.8 (*)    All other components within normal limits  URINALYSIS, COMPLETE (UACMP) WITH MICROSCOPIC - Abnormal; Notable for the following components:   Color, Urine COLORLESS (*)    APPearance CLEAR (*)    Specific Gravity, Urine 1.002 (*)    Hgb urine dipstick LARGE (*)    All other components within normal limits  LIPASE, BLOOD  CBC  POC URINE PREG, ED  POCT PREGNANCY, URINE   ____________________________________________   EKG    ____________________________________________    RADIOLOGY  Ct Abdomen Pelvis W Contrast  Result Date: 01/06/2018 CLINICAL DATA:  Abdominal pain and left lower quadrant starting at 10:30. EXAM: CT ABDOMEN AND PELVIS WITH CONTRAST TECHNIQUE: Multidetector CT imaging of the abdomen and pelvis was performed using the standard protocol following bolus administration of intravenous contrast. CONTRAST:  18m ISOVUE-300 IOPAMIDOL (ISOVUE-300) INJECTION 61% COMPARISON:  12/17/2016 FINDINGS: Lower chest: Normal heart size. No pericardial effusion. Small hiatal hernia. Hepatobiliary: No focal liver abnormality is seen. Status post cholecystectomy. No biliary dilatation. Pancreas: Unremarkable. No pancreatic ductal  dilatation or surrounding inflammatory changes. Spleen: Normal in size without focal abnormality. Adrenals/Urinary Tract: Adrenal glands are unremarkable. Symmetric cortical enhancement of both kidneys without obstructive uropathy. 3 mm left interpolar renal calculus is noted. Suspected calcification in the lower pole the right kidney is not apparent and may have represented contrast within a calyx previously. No hydroureteronephrosis. The urinary bladder is unremarkable for the degree of distention. Stomach/Bowel: Prior gastric sleeve procedure without complicating features. No fluid extravasation. No bowel obstruction or inflammation. Vascular/Lymphatic: Aortic atherosclerosis. No enlarged abdominal or pelvic lymph nodes. Reproductive: Mild intraparenchymal enhancement of the anterior uterine myometrium, nonspecific possibly associated with a fibroid. Small adjacent area of hypodensity noted within the region of enhancement may represent some necrosis. Patient has a negative urine pregnancy test prior to this study. Post right oophorectomy. No adnexal masses or adenopathy. Other: No abdominal wall hernia or abnormality. No abdominopelvic ascites. Musculoskeletal: No acute or significant osseous findings. IMPRESSION: 1. Status post gastric sleeve procedure without complicating features. No bowel obstruction or inflammation. 2. 3 mm nonobstructing left interpolar renal calculus. 3. Mild 2.3 cm area of intraparenchymal enhancement of the anterior uterine myometrium, nonspecific possibly associated with a fibroid. Status post right oophorectomy. Aortic Atherosclerosis (ICD10-I70.0). Electronically Signed   By: DAshley RoyaltyM.D.   On: 01/06/2018 17:11    ____________________________________________   PROCEDURES Procedures  ____________________________________________  DIFFERENTIAL DIAGNOSIS   Viral enteritis, diverticulitis, GI bleed, menses  CLINICAL IMPRESSION / ASSESSMENT AND PLAN / ED  COURSE  Pertinent labs & imaging results that were available during my care of the patient were reviewed by me and considered in my medical decision making (see chart for details).    Patient presents with generalized abdominal pain.  Labs are normal,  vital signs are normal.  Abdomen is nonsurgical and she is nontoxic.  Will attempt to control symptoms with Bentyl as I think that most likely this is a viral syndrome that is evolving.  Clinical Course as of Jan 06 1900  Mon Jan 06, 2018  1640 Reassessed.  Repeat abdominal exam still shows diffuse tenderness.  She is not feeling any better despite antispasmodics.  Engaged in shared decision-making regarding possibility of obtaining a CT scan to evaluate for diverticulitis given that she has had diverticular disease identified on colonoscopy in the past.  She is agreeable to obtaining a CT scan today as this will change the therapeutic choices we make going forward.   [PS]    Clinical Course User Index [PS] Carrie Mew, MD     ----------------------------------------- 7:01 PM on 01/06/2018 -----------------------------------------  CT scan unremarkable, shows a small uterine fibroid but otherwise no significant findings.  Patient informed of the findings.  Vital signs are normal, stable for discharge home and outpatient follow-up.  Expect that her symptoms will resolve on their own within a few days  ____________________________________________   FINAL CLINICAL IMPRESSION(S) / ED DIAGNOSES    Final diagnoses:  Uterine leiomyoma, unspecified location  Generalized abdominal pain     ED Discharge Orders    None      Portions of this note were generated with dragon dictation software. Dictation errors may occur despite best attempts at proofreading.    Carrie Mew, MD 01/06/18 1902

## 2018-01-07 ENCOUNTER — Telehealth: Payer: Self-pay

## 2018-01-07 DIAGNOSIS — D259 Leiomyoma of uterus, unspecified: Secondary | ICD-10-CM | POA: Diagnosis not present

## 2018-01-07 MED ORDER — OXYCODONE-ACETAMINOPHEN 5-325 MG PO TABS: 2.0000 | ORAL_TABLET | ORAL | 0 refills | Status: AC | PRN

## 2018-01-07 NOTE — Telephone Encounter (Signed)
Patient called stating she thought she was getting a prescription for 5 days of percocet after being seen last night but only got 6 pills. Discussed with patient recommendation to use ibuprofen for pain management and percocet for break through pain but patient states she is taking 2 percocet every 4 hours and does not want to take ibuprofen. Recommended that patient call private MD for appointment and management of pain medication if needed. Patient verbalized understanding.  Wende Mott, CNM 01/07/18 4:39 PM

## 2018-01-07 NOTE — Discharge Instructions (Signed)
Uterine Fibroids Uterine fibroids are tissue masses (tumors). They are also called leiomyomas. They can develop inside of a woman's womb (uterus). They can grow very large. Fibroids are not cancerous (benign). Most fibroids do not require medical treatment. Follow these instructions at home:  Keep all follow-up visits as told by your doctor. This is important.  Take medicines only as told by your doctor. ? If you were prescribed a hormone treatment, take the hormone medicines exactly as told. ? Do not take aspirin. It can cause bleeding.  Ask your doctor about taking iron pills and increasing the amount of dark green, leafy vegetables in your diet. These actions can help to boost your blood iron levels.  Pay close attention to your period. Tell your doctor about any changes, such as: ? Increased blood flow. This may require you to use more pads or tampons than usual per month. ? A change in the number of days that your period lasts per month. ? A change in symptoms that come with your period, such as back pain or cramping in your belly area (abdomen). Contact a doctor if:  You have pain in your back or the area between your hip bones (pelvic area) that is not controlled by medicines.  You have pain in your abdomen that is not controlled with medicines.  You have an increase in bleeding between and during periods.  You soak tampons or pads in a half hour or less.  You feel lightheaded.  You feel extra tired.  You feel weak. Get help right away if:  You pass out (faint).  You have a sudden increase in pelvic pain. This information is not intended to replace advice given to you by your health care provider. Make sure you discuss any questions you have with your health care provider. Document Released: 03/10/2010 Document Revised: 10/07/2015 Document Reviewed: 08/04/2013 Elsevier Interactive Patient Education  2018 [employer]  

## 2018-01-07 NOTE — MAU Provider Note (Addendum)
History     CSN: [id number]  Arrival date and time: 01/06/18 2131   First Provider Initiated Contact with Patient 01/07/18 0031      Chief Complaint  Patient presents with  . Abdominal Pain  . Vaginal Bleeding   HPI  Claire [name] is a 47 y.o. G3P3 non-pregnant patient who presents to MAU with chief complaint of moderate abdominal pain in the setting of newly diagnosed uterine fibroids.  Pain is 9/10, diffuse throughout lower abdomen, does not radiate, no aggravating or alleviating factors.  Patient was triaged at [facility] ED this afternoon and determined to have uterine leiomyomas. She was discharged with instructions for outpatient follow-up and to manage pain with Ibuprofen and Tylenol. Patient states she had a near-syncopal episode after discharge due to her intense abdominal pain and is requesting "something stronger" for analgesia.   OB History    Gravida  3   Para  3   Term      Preterm      AB      Living  3     SAB      TAB      Ectopic      Multiple      Live Births           Obstetric Comments  1st Menstrual Cycle:  14 1st Pregnancy:  27        Past Medical History:  Diagnosis Date  . Anemia   . Anxiety   . Asthma   . Bilateral leg cramps   . Depression   . Diabetes mellitus without complication ([place])    history no longer a problem since weight loss surgery  . DJD (degenerative joint disease)   . GERD (gastroesophageal reflux disease)   . Headache    Migraines  . IBS (irritable bowel syndrome)   . Kidney stone   . Low serum vitamin B12   . Lung nodules   . Migraine   . Muscle spasm   . Psoriasis    vaginal area  . Seasonal allergies   . Sleep apnea    history no longer a problem since weight loss surgery  . Vitamin D deficiency     Past Surgical History:  Procedure Laterality Date  . BLADDER SURGERY  2010  . BREAST BIOPSY Right 2014   stereotatic biopsy  . CHOLECYSTECTOMY  2010  . COLONOSCOPY  2014     Done at [facility]  . DILITATION & CURRETTAGE/HYSTROSCOPY WITH NOVASURE ABLATION N/A 06/16/2015   Procedure: DILATATION & CURETTAGE/HYSTEROSCOPY WITH NOVASURE ABLATION;  Surgeon: Brien Few, MD;  Location: [place] [facility];  Service: Gynecology;  Laterality: N/A;  . [place] RESECTION  2012  . LAPAROSCOPY N/A 06/16/2015   Procedure: LAPAROSCOPY DIAGNOSTIC;  Surgeon: Brien Few, MD;  Location: [place] [facility];  Service: Gynecology;  Laterality: N/A;  . LYSIS OF ADHESION N/A 06/16/2015   Procedure: LYSIS OF ADHESION;  Surgeon: Brien Few, MD;  Location: [place] [facility];  Service: Gynecology;  Laterality: N/A;  . PLANTAR FASCIA SURGERY Bilateral   . ROBOTIC ASSISTED SALPINGO OOPHERECTOMY Right 06/16/2015   Procedure: ROBOTIC ASSISTED SALPINGO OOPHORECTOMY, EXCISION OF RIGHT CUL DE [place] MASS;  Surgeon: Brien Few, MD;  Location: [place] [facility];  Service: Gynecology;  Laterality: Right;  . TONSILLECTOMY    . UPPER GI ENDOSCOPY      Family History  Problem Relation Age of Onset  . Heart attack Mother   . Stroke Mother   . Multiple myeloma Mother   .  Hyperlipidemia Father   . Heart attack Sister   . Heart attack Maternal Uncle   . Heart attack Paternal Grandfather   . Breast cancer Paternal Aunt   . Breast cancer Paternal Grandmother   . Lung cancer Maternal Grandfather     Social History   Tobacco Use  . Smoking status: Former Smoker    Packs/day: 1.00    Years: 10.00    Pack years: 10.00    Types: Cigarettes    Last attempt to quit: 02/19/1993    Years since quitting: 24.8  . Smokeless tobacco: Never Used  Substance Use Topics  . Alcohol use: Yes    Alcohol/week: 0.0 standard drinks    Comment: occasionally  . Drug use: No    Allergies:  Allergies  Allergen Reactions  . Gabapentin     Bladder incontinence at higher dose  . Linzess [Linaclotide]     Worsening of diarrhea  . Other Hives and Other (See Comments)    Ricotta Cheese: flushed and hot Ricotta Cheese: flushed and  hot Ricotta Cheese: flushed and hot  . Cymbalta [Duloxetine Hcl] Palpitations    And photosensitivity  . Vicodin [Hydrocodone-Acetaminophen] Itching    No medications prior to admission.    Review of Systems  Constitutional: Positive for fatigue. Negative for diaphoresis and fever.  Gastrointestinal: Positive for abdominal pain.  Musculoskeletal: Negative for back pain.  Neurological: Negative for dizziness, facial asymmetry, weakness, numbness and headaches.  All other systems reviewed and are negative.  Physical Exam   Blood pressure 121/60, pulse 62, temperature 97.7 F (36.5 C), temperature source Oral, resp. rate 15, height _0  (1.549 m), weight 60.8 kg, last menstrual period 01/06/2018, SpO2 100 %.  Physical Exam  Nursing note and vitals reviewed. Constitutional: She is oriented to person, place, and time. She appears well-developed and well-nourished.  Cardiovascular: Normal rate, normal heart sounds and intact distal pulses.  Respiratory: Effort normal and breath sounds normal.  GI: Soft. She exhibits no distension. There is tenderness.  Diffuse abdominal tenderness to deep palpation  Musculoskeletal: Normal range of motion.  Neurological: She is alert and oriented to person, place, and time. She has normal reflexes.  Skin: Skin is warm and dry.  Psychiatric: She has a normal mood and affect. Her behavior is normal. Judgment and thought content normal.    MAU Course  Procedures  MDM   --Pain scale reduced from 9/10 to 4/10 with medications administered in MAU  Patient Vitals for the past 24 hrs:  BP Temp Temp src Pulse Resp SpO2 Height Weight  01/07/18 0100 121/60 - - 62 - - - -  01/06/18 2209 (!) 111/52 97.7 F (36.5 C) Oral (!) 57 15 100 % _1  (1.549 m) 60.8 kg    Meds ordered this encounter  Medications  . oxyCODONE-acetaminophen (PERCOCET/ROXICET) 5-325 MG per tablet 1 tablet    Patient has previously taken for kidney stone pain and did not have a  negative reaction  . oxyCODONE-acetaminophen (PERCOCET/ROXICET) 5-325 MG tablet    Sig: Take 2 tablets by mouth every 4 (four) hours as needed for up to 5 days for severe pain.    Dispense:  6 tablet    Refill:  0    Order Specific Question:   Supervising Provider    Answer:   Merrily Pew   Results for orders placed or performed during the hospital encounter of 01/06/18 (from the past 24 hour(s))  Urinalysis, Routine w reflex microscopic  Status: Abnormal   Collection Time: 01/06/18 10:18 PM  Result Value Ref Range   Color, Urine YELLOW YELLOW   APPearance CLEAR CLEAR   Specific Gravity, Urine >1.046 (H) 1.005 - 1.030   pH 6.0 5.0 - 8.0   Glucose, UA NEGATIVE NEGATIVE mg/dL   Hgb urine dipstick LARGE (A) NEGATIVE   Bilirubin Urine NEGATIVE NEGATIVE   Ketones, ur NEGATIVE NEGATIVE mg/dL   Protein, ur NEGATIVE NEGATIVE mg/dL   Nitrite NEGATIVE NEGATIVE   Leukocytes, UA NEGATIVE NEGATIVE   RBC / HPF 6-10 0 - 5 RBC/hpf   WBC, UA 0-5 0 - 5 WBC/hpf   Bacteria, UA RARE (A) NONE SEEN   Squamous Epithelial / LPF 0-5 0 - 5   Mucus PRESENT    Assessment and Plan  --47 y.o. G3P3 non pregnant patient with new diagnosis of uterine fibroids --short course prescription for Percocet --Discharge home in stable condition  F/U: Message to [facility] clinic to schedule patient for follow-up this week  Darlina Rumpf, CNM 01/07/2018, 1:26 AM

## 2018-01-08 DIAGNOSIS — R102 Pelvic and perineal pain: Secondary | ICD-10-CM | POA: Diagnosis not present

## 2018-01-09 ENCOUNTER — Ambulatory Visit: Payer: Self-pay

## 2018-01-09 ENCOUNTER — Encounter: Payer: Self-pay | Admitting: Emergency Medicine

## 2018-01-09 ENCOUNTER — Emergency Department
Admission: EM | Admit: 2018-01-09 | Discharge: 2018-01-09 | Disposition: A | Discharge status: Home / Self Care | Payer: BLUE CROSS/BLUE SHIELD | Attending: Emergency Medicine | Admitting: Emergency Medicine

## 2018-01-09 ENCOUNTER — Other Ambulatory Visit: Payer: Self-pay

## 2018-01-09 DIAGNOSIS — R109 Unspecified abdominal pain: Secondary | ICD-10-CM | POA: Diagnosis not present

## 2018-01-09 DIAGNOSIS — R1011 Right upper quadrant pain: Secondary | ICD-10-CM | POA: Insufficient documentation

## 2018-01-09 DIAGNOSIS — E119 Type 2 diabetes mellitus without complications: Secondary | ICD-10-CM | POA: Diagnosis not present

## 2018-01-09 DIAGNOSIS — Z7982 Long term (current) use of aspirin: Secondary | ICD-10-CM | POA: Insufficient documentation

## 2018-01-09 DIAGNOSIS — R1031 Right lower quadrant pain: Secondary | ICD-10-CM | POA: Insufficient documentation

## 2018-01-09 DIAGNOSIS — Z79899 Other long term (current) drug therapy: Secondary | ICD-10-CM | POA: Diagnosis not present

## 2018-01-09 DIAGNOSIS — Z87891 Personal history of nicotine dependence: Secondary | ICD-10-CM | POA: Insufficient documentation

## 2018-01-09 LAB — CBC
HCT: 38.1 % (ref 36.0–46.0)
Hemoglobin: 12.2 g/dL (ref 12.0–15.0)
MCH: 30.4 pg (ref 26.0–34.0)
MCHC: 32 g/dL (ref 30.0–36.0)
MCV: 95 fL (ref 80.0–100.0)
PLATELETS: 248 10*3/uL (ref 150–400)
RBC: 4.01 MIL/uL (ref 3.87–5.11)
RDW: 13.3 % (ref 11.5–15.5)
WBC: 6.5 10*3/uL (ref 4.0–10.5)
nRBC: 0 % (ref 0.0–0.2)

## 2018-01-09 LAB — COMPREHENSIVE METABOLIC PANEL
ALBUMIN: 3.6 g/dL (ref 3.5–5.0)
ALK PHOS: 74 U/L (ref 38–126)
ALT: 37 U/L (ref 0–44)
ANION GAP: 7 (ref 5–15)
AST: 25 U/L (ref 15–41)
BUN: 7 mg/dL (ref 6–20)
CALCIUM: 8.7 mg/dL — AB (ref 8.9–10.3)
CHLORIDE: 106 mmol/L (ref 98–111)
CO2: 26 mmol/L (ref 22–32)
Creatinine, Ser: 0.52 mg/dL (ref 0.44–1.00)
GFR calc Af Amer: >60 mL/min (ref >60)
GFR calc non Af Amer: >60 mL/min (ref >60)
GLUCOSE: 98 mg/dL (ref 70–99)
Potassium: 3.8 mmol/L (ref 3.5–5.1)
SODIUM: 139 mmol/L (ref 135–145)
Total Bilirubin: 0.7 mg/dL (ref 0.3–1.2)
Total Protein: 6.3 g/dL — ABNORMAL LOW (ref 6.5–8.1)

## 2018-01-09 LAB — URINALYSIS, COMPLETE (UACMP) WITH MICROSCOPIC
BILIRUBIN URINE: NEGATIVE
Bacteria, UA: NONE SEEN
Glucose, UA: NEGATIVE mg/dL
Hgb urine dipstick: NEGATIVE
KETONES UR: NEGATIVE mg/dL
LEUKOCYTES UA: NEGATIVE
Nitrite: NEGATIVE
PH: 8 (ref 5.0–8.0)
Protein, ur: NEGATIVE mg/dL
Specific Gravity, Urine: 1.011 (ref 1.005–1.030)

## 2018-01-09 LAB — PREGNANCY, URINE: Preg Test, Ur: NEGATIVE

## 2018-01-09 LAB — LIPASE, BLOOD: LIPASE: 25 U/L (ref 11–51)

## 2018-01-09 MED ORDER — IOPAMIDOL (ISOVUE-300) INJECTION 61%
30.0000 mL | Freq: Once | INTRAVENOUS | Status: AC | PRN
  Administered 2018-01-09: 30 mL via ORAL

## 2018-01-09 MED ORDER — PANTOPRAZOLE SODIUM 40 MG PO TBEC: 40.0000 mg | DELAYED_RELEASE_TABLET | Freq: Every day | ORAL | 1 refills | Status: DC

## 2018-01-09 MED ORDER — TRAMADOL HCL 50 MG PO TABS
100.0000 mg | ORAL_TABLET | Freq: Once | ORAL | Status: AC
  Administered 2018-01-09: 100 mg via ORAL
  Filled 2018-01-09: qty 2

## 2018-01-09 MED ORDER — LIDOCAINE VISCOUS HCL 2 % MT SOLN
15.0000 mL | Freq: Once | OROMUCOSAL | Status: AC
  Administered 2018-01-09: 15 mL via ORAL
  Filled 2018-01-09: qty 15

## 2018-01-09 MED ORDER — TRAMADOL HCL 50 MG PO TABS: 50.0000 mg | ORAL_TABLET | Freq: Four times a day (QID) | ORAL | 0 refills | Status: DC | PRN

## 2018-01-09 MED ORDER — PANTOPRAZOLE SODIUM 40 MG PO TBEC
40.0000 mg | DELAYED_RELEASE_TABLET | Freq: Once | ORAL | Status: AC
  Administered 2018-01-09: 40 mg via ORAL
  Filled 2018-01-09: qty 1

## 2018-01-09 MED ORDER — ALUM & MAG HYDROXIDE-SIMETH 200-200-20 MG/5ML PO SUSP
30.0000 mL | Freq: Once | ORAL | Status: AC
  Administered 2018-01-09: 30 mL via ORAL
  Filled 2018-01-09: qty 30

## 2018-01-09 NOTE — Discharge Instructions (Addendum)
Please call the number provided for GI medicine to see if they can see you any sooner than your scheduled appointment.  Please take your pain medication as needed but only as prescribed.  You may also take over-the-counter Tylenol as needed, as written on the box.  Please take your Protonix every day regardless of symptoms.  Return to the emergency department for any significant worsening of symptoms, development of fever, or any other symptom personally concerning to yourself.

## 2018-01-09 NOTE — Telephone Encounter (Signed)
Pt called with report of abdominal pain that started Monday. She states the pain was severe and when she went to the bathroom she passed a lot of blood.  She was seen in the ED a CT scan was done and she was told to see her OBGYN. She was Dx with uterine fibroids. She went home and the pain continued to the point that she passed out. Her husband took her to [place] and they sent her home with pain medication. She now calls to report continued pain and swelling to her abdomin. She states the pain is starting to radiate to her back. She continues with severe abdominal pain that is now moved to the right side. She eat's and it causes the pain to worsen. Per protocol Pt will go to the ED for further work up. Care advice read to patient Pt verbalized understanding of advice.  Reason for Disposition . [1] SEVERE pain (e.g., excruciating) AND [2] present > 1 hour  Answer Assessment - Initial Assessment Questions 1. LOCATION: "Where does it hurt?"      Rt abdomin 2. RADIATION: "Does the pain shoot anywhere else?" (e.g., chest, back)     To back 3. ONSET: "When did the pain begin?" (e.g., minutes, hours or days ago)      Monday 01/06/18 4. SUDDEN: "Gradual or sudden onset?"     Sudden 5. PATTERN "Does the pain come and go, or is it constant?"    - If constant: "Is it getting better, staying the same, or worsening?"      (Note: Constant means the pain never goes away completely; most serious pain is constant and it progresses)     - If intermittent: "How long does it last?" "Do you have pain now?"     (Note: Intermittent means the pain goes away completely between bouts)     Comes and goes very severe 6. SEVERITY: "How bad is the pain?"  (e.g., Scale 1-10; mild, moderate, or severe)   - MILD (1-3): doesn't interfere with normal activities, abdomen soft and not tender to touch    - MODERATE (4-7): interferes with normal activities or awakens from sleep, tender to touch    - SEVERE (8-10):  excruciating pain, doubled over, unable to do any normal activities      10 when it hits  4-5 when mild 7. RECURRENT SYMPTOM: "Have you ever had this type of abdominal pain before?" If so, ask: "When was the last time?" and "What happened that time?"      no 8. CAUSE: "What do you think is causing the abdominal pain?"     unknown 9. RELIEVING/AGGRAVATING FACTORS: "What makes it better or worse?" (e.g., movement, antacids, bowel movement)     Eating makes worse 10. OTHER SYMPTOMS: "Has there been any vomiting, diarrhea, constipation, or urine problems?" temp 99.4 to 99.9       Feel pressure of needing an BM.  Last BM was last night. 11. PREGNANCY: "Is there any chance you are pregnant?" "When was your last menstrual period?"       No ablasion 2 years ago  Protocols used: ABDOMINAL PAIN - [facility]

## 2018-01-09 NOTE — ED Triage Notes (Signed)
Pt presents to ED via POV with c/o RUQ abdominal pain since Monday, pt states was seen and dx with uterine fibroid. Pt had follow up with GYN regarding fibroid and was told that the uterine fibroid could not be causing her pain. Pt states abdominal distention, BLE, and being unable to eat. Pt is A&O x 4, able to speak in complete sentences at this time. Pt states pain is intermittent and approx 5 times a day and "gets so bad [she] passess out" due to pain.

## 2018-01-09 NOTE — ED Provider Notes (Signed)
[facility] Emergency Department Provider Note  Time seen: 4:38 PM  I have reviewed the triage vital signs and the nursing notes.   HISTORY  Chief Complaint Abdominal Pain    HPI Claire [name] is a 47 y.o. female with a past medical history of anemia, anxiety, diabetes, gastric reflux, migraines, presents to the emergency department for abdominal pain.  According to the patient for the past 4 to 5 days she has been experiencing pain in the right mid abdomen radiating around to her right back.  Saw her OB/GYN who did an ultrasound and pelvic exam and said she had fibroids but this was not the cause of her pain and she needed to follow-up with GI medicine.  Patient obtained an appointment with GI medicine but is not until 01/22/2018.  Patient was still having abdominal pain so she came to the emergency department for evaluation.  Currently describes a 6/10 dull pain in the right mid abdomen.  Past Medical History:  Diagnosis Date  . Anemia   . Anxiety   . Asthma   . Bilateral leg cramps   . Depression   . Diabetes mellitus without complication ([place])    history no longer a problem since weight loss surgery  . DJD (degenerative joint disease)   . GERD (gastroesophageal reflux disease)   . Headache    Migraines  . IBS (irritable bowel syndrome)   . Kidney stone   . Low serum vitamin B12   . Lung nodules   . Migraine   . Muscle spasm   . Psoriasis    vaginal area  . Seasonal allergies   . Sleep apnea    history no longer a problem since weight loss surgery  . Vitamin D deficiency     Patient Active Problem List   Diagnosis Date Noted  . OCD (obsessive compulsive disorder) 10/29/2017  . GAD (generalized anxiety disorder) 10/29/2017  . Nephrolithiasis 10/22/2017  . Atherosclerosis of aorta ([place]) 12/21/2016  . Sacroiliac inflammation ([place]) 11/19/2016  . Esophageal hiatal hernia 08/17/2016  . Diverticulosis of colon 08/17/2016  . Iron deficiency  anemia 08/08/2015  . Lumbosacral pain 01/07/2015  . Muscle twitching 01/07/2015  . Intertrigo 11/05/2014  . Perennial allergic rhinitis 09/03/2014  . Lung nodules 09/03/2014  . Vitamin D deficiency 09/03/2014  . Diet-controlled diabetes mellitus ([place]) 09/03/2014  . History of kidney stones 09/03/2014  . Chronic insomnia 09/03/2014  . History of iron deficiency anemia 09/03/2014  . Depression with anxiety 09/03/2014  . Palpitations 06/22/2014  . History of bariatric surgery 12/03/2013  . Asthma, mild persistent 11/21/2012  . CN (constipation) 11/21/2012  . GERD without esophagitis 11/21/2012  . History of hyperlipidemia 11/21/2012  . Migraine with aura and without status migrainosus 11/21/2012  . History of sleep apnea 11/21/2012  . IBS (irritable bowel syndrome) 08/15/2012    Past Surgical History:  Procedure Laterality Date  . BLADDER SURGERY  2010  . BREAST BIOPSY Right 2014   stereotatic biopsy  . CHOLECYSTECTOMY  2010  . COLONOSCOPY  2014    Done at [facility]  . DILITATION & CURRETTAGE/HYSTROSCOPY WITH NOVASURE ABLATION N/A 06/16/2015   Procedure: DILATATION & CURETTAGE/HYSTEROSCOPY WITH NOVASURE ABLATION;  Surgeon: Brien Few, MD;  Location: [facility];  Service: Gynecology;  Laterality: N/A;  . [place] RESECTION  2012  . LAPAROSCOPY N/A 06/16/2015   Procedure: LAPAROSCOPY DIAGNOSTIC;  Surgeon: Brien Few, MD;  Location: [place];  Service: Gynecology;  Laterality: N/A;  . LYSIS  OF ADHESION N/A 06/16/2015   Procedure: LYSIS OF ADHESION;  Surgeon: Brien Few, MD;  Location: [place] [facility];  Service: Gynecology;  Laterality: N/A;  . PLANTAR FASCIA SURGERY Bilateral   . ROBOTIC ASSISTED SALPINGO OOPHERECTOMY Right 06/16/2015   Procedure: ROBOTIC ASSISTED SALPINGO OOPHORECTOMY, EXCISION OF RIGHT CUL DE [place] MASS;  Surgeon: Brien Few, MD;  Location: [facility];  Service: Gynecology;  Laterality: Right;  . TONSILLECTOMY    . UPPER GI ENDOSCOPY      Prior to  Admission medications   Medication Sig Start Date End Date Taking? Authorizing Provider  albuterol (PROVENTIL HFA;VENTOLIN HFA) 108 (90 Base) MCG/ACT inhaler TAKE 2 PUFFS EVERY 6 HOURS AS NEEDED FOR WHEEZING OR SHORTNESS OF BREATH. (VENTOLIN NOT COVERED) 12/04/17   Hubbard Hartshorn, FNP  ALPRAZolam Duanne Moron) 0.5 MG tablet Take 1 tablet (0.5 mg total) by mouth at bedtime as needed for anxiety. 09/04/17   Steele Sizer, MD  Ascorbic Acid (VITAMIN C) 1000 MG tablet Take 1,000 mg by mouth.    [provider]  aspirin EC 81 MG tablet Take 81 mg by mouth daily.    [provider]  atorvastatin (LIPITOR) 10 MG tablet Take 1 tablet (10 mg total) by mouth daily. 02/20/17   Steele Sizer, MD  calcium carbonate (OS-CAL) 600 MG TABS tablet Take 600 mg by mouth daily with breakfast.    [provider]  clobetasol ointment (TEMOVATE) 0.05 %  06/06/15   [provider]  cyclobenzaprine (FLEXERIL) 10 MG tablet Take 1 tablet (10 mg total) by mouth at bedtime. Prn 11/11/15   Steele Sizer, MD  ferrous sulfate 325 (65 FE) MG EC tablet Take 325 mg by mouth daily with breakfast.     [provider]  fluconazole (DIFLUCAN) 150 MG tablet Take 1 tablet (150 mg total) by mouth every other day. 11/25/17   Steele Sizer, MD  gentamicin ointment (GARAMYCIN) 0.1 % APP IN NOSE TID PRF NASAL DRYNESS UTD 01/11/17   Beverly Gust, MD  lansoprazole (PREVACID) 30 MG capsule TAKE 1 CAPSULE DAILY 12/02/17   Steele Sizer, MD  loratadine (CLARITIN) 10 MG tablet Take 10 mg by mouth daily.    [provider]  meclizine (ANTIVERT) 25 MG tablet TK 1 T PO Q 8 H PRF VERTIGO 12/03/16   Beverly Gust, MD  mometasone (ELOCON) 0.1 % lotion APP TO ITCHY EARS BID FOR 12 DAYS. MAY REPEAT PRF ITCHY EARS 01/09/17   Beverly Gust, MD  mometasone (NASONEX) 50 MCG/ACT nasal spray USE 2 SPRAYS IN EACH NOSTRIL AT BEDTIME 08/18/17   Sowles, Drue Stager, MD  montelukast (SINGULAIR) 10 MG tablet TAKE  1 TABLET(10 MG) BY MOUTH AT BEDTIME 09/04/17   Steele Sizer, MD  nystatin-triamcinolone ointment [facility]) Apply 1 application 2 (two) times daily topically. Mix at home with A&D ointment apply with each bowel movement 12/31/16   Steele Sizer, MD  oxyCODONE-acetaminophen (PERCOCET/ROXICET) 5-325 MG tablet Take 2 tablets by mouth every 4 (four) hours as needed for up to 5 days for severe pain. 01/07/18 01/12/18  Darlina Rumpf, CNM  Potassium 99 MG TABS Take by mouth.    [provider]  promethazine (PHENERGAN) 12.5 MG tablet TAKE 1 TABLET BY MOUTH EVERY 6 HOURS AS NEEDED FOR MIGRAINES WITH NAUSEA 11/25/17   Sowles, Drue Stager, MD  Semaglutide (OZEMPIC) 1 MG/DOSE SOPN Inject 1 mg into the skin once a week. 09/04/17   Steele Sizer, MD  SUMAtriptan (IMITREX) 100 MG tablet No more than 2 in 24 hours  08/23/17   Steele Sizer, MD  vitamin B-12 (CYANOCOBALAMIN) 100 MCG tablet Take 100 mcg by mouth as directed.     [provider]  Vitamin D, Ergocalciferol, (DRISDOL) 50000 units CAPS capsule Take 1 capsule (50,000 Units total) by mouth every 7 (seven) days. TAKE 1 CAPSULE EVERY 7 DAYS 12/02/17   Steele Sizer, MD  zolpidem (AMBIEN) 10 MG tablet Must be [employer] 11/29/17   Sowles, Drue Stager, MD  zonisamide (ZONEGRAN) 50 MG capsule Take 3 capsules by mouth daily. 02/10/16   Orie Rout, MD    Allergies  Allergen Reactions  . Gabapentin     Bladder incontinence at higher dose  . Linzess [Linaclotide]     Worsening of diarrhea  . Other Hives and Other (See Comments)    Ricotta Cheese: flushed and hot Ricotta Cheese: flushed and hot Ricotta Cheese: flushed and hot  . Cymbalta [Duloxetine Hcl] Palpitations    And photosensitivity  . Vicodin [Hydrocodone-Acetaminophen] Itching    Family History  Problem Relation Age of Onset  . Heart attack Mother   . Stroke Mother   . Multiple myeloma Mother   . Hyperlipidemia Father   . Heart attack Sister   . Heart  attack Maternal Uncle   . Heart attack Paternal Grandfather   . Breast cancer Paternal Aunt   . Breast cancer Paternal Grandmother   . Lung cancer Maternal Grandfather     Social History Social History   Tobacco Use  . Smoking status: Former Smoker    Packs/day: 1.00    Years: 10.00    Pack years: 10.00    Types: Cigarettes    Last attempt to quit: 02/19/1993    Years since quitting: 24.9  . Smokeless tobacco: Never Used  Substance Use Topics  . Alcohol use: Yes    Alcohol/week: 0.0 standard drinks    Comment: occasionally  . Drug use: No    Review of Systems Constitutional: Negative for fever. Cardiovascular: Negative for chest pain. Respiratory: Negative for shortness of breath. Gastrointestinal: Moderate right mid abdominal pain.  Occasion with vomiting when the pain is severe.  Negative for diarrhea Genitourinary: Negative for urinary compaints.  Negative for hematuria. Musculoskeletal: Negative for musculoskeletal complaints Neurological: Negative for headache All other ROS negative  ____________________________________________   PHYSICAL EXAM:  VITAL SIGNS: ED Triage Vitals  Enc Vitals Group     BP 01/09/18 1309 112/62     Pulse Rate 01/09/18 1309 81     Resp --      Temp 01/09/18 1309 99.1 F (37.3 C)     Temp Source 01/09/18 1309 Oral     SpO2 01/09/18 1309 98 %     Weight 01/09/18 1316 134 lb (60.8 kg)     Height 01/09/18 1316 _0  (1.549 m)     Head Circumference --      Peak Flow --      Pain Score 01/09/18 1315 4     Pain Loc --      Pain Edu? --      Excl. in [place]? --    Constitutional: Alert and oriented. Well appearing and in no distress. Eyes: Normal exam ENT   Head: Normocephalic and atraumatic   Mouth/Throat: Mucous membranes are moist. Cardiovascular: Normal rate, regular rhythm. No murmur Respiratory: Normal respiratory effort without tachypnea nor retractions. Breath sounds are clear Gastrointestinal: Soft, moderate right mid  abdominal tenderness palpation with no rebound guarding or distention.  Abdomen is benign otherwise. Musculoskeletal: Nontender with  normal range of motion in all extremities. Neurologic:  Normal speech and language. No gross focal neurologic deficits Skin:  Skin is warm, dry and intact.  Psychiatric: Mood and affect are normal.  ____________________________________________    EKG  EKG reviewed and interpreted by myself shows a normal sinus rhythm at 76 bpm with a narrow QRS, normal axis, normal intervals, no concerning ST changes.  ____________________________________________    INITIAL IMPRESSION / ASSESSMENT AND PLAN / ED COURSE  Pertinent labs & imaging results that were available during my care of the patient were reviewed by me and considered in my medical decision making (see chart for details).  She presents to the emergency department for 4 or 5 days of right-sided abdominal pain.  Patient denies any hematuria or dysuria, does state occasional nausea vomiting when the pain becomes severe.  Patient was seen 3 days ago had a CT scan performed at that time was largely negative although it did show uterine fibroids.  Followed up with her OB/GYN yesterday who did an ultrasound and stated she did have fibroids but this was not the cause of her pain.  They arranged for GI follow-up but could not be seen until 01/22/2018.  Patient was continuing to have pain so she came to the ER for evaluation.  Patient's labs today are within normal limits.  Normal white blood cell count, normal LFTs and lipase.  Urinalysis is normal.  Patient is 8 years status post cholecystectomy.  Negative CT scan performed 3 days ago, do not believe repeat imaging would be of much use at this time.  We will treat with a GI cocktail.  I believe the next step would be GI follow-up for possible endoscopy.  In the meantime I believe the patient would benefit from a PPI we will also discharge with a short course of pain  medication.  Patient agreeable to plan of care.  ____________________________________________   FINAL CLINICAL IMPRESSION(S) / ED DIAGNOSES  Right-sided abdominal pain    Harvest Dark, MD 01/09/18 1657

## 2018-01-12 ENCOUNTER — Encounter: Payer: Self-pay | Admitting: Family Medicine

## 2018-01-14 DIAGNOSIS — F5105 Insomnia due to other mental disorder: Secondary | ICD-10-CM | POA: Diagnosis not present

## 2018-01-14 DIAGNOSIS — F429 Obsessive-compulsive disorder, unspecified: Secondary | ICD-10-CM | POA: Diagnosis not present

## 2018-01-14 DIAGNOSIS — F349 Persistent mood [affective] disorder, unspecified: Secondary | ICD-10-CM | POA: Diagnosis not present

## 2018-01-14 DIAGNOSIS — F411 Generalized anxiety disorder: Secondary | ICD-10-CM | POA: Diagnosis not present

## 2018-01-15 DIAGNOSIS — F5105 Insomnia due to other mental disorder: Secondary | ICD-10-CM | POA: Insufficient documentation

## 2018-01-15 DIAGNOSIS — F349 Persistent mood [affective] disorder, unspecified: Secondary | ICD-10-CM | POA: Insufficient documentation

## 2018-01-22 ENCOUNTER — Other Ambulatory Visit: Payer: Self-pay

## 2018-01-22 ENCOUNTER — Ambulatory Visit: Payer: BLUE CROSS/BLUE SHIELD | Admitting: Gastroenterology

## 2018-01-22 ENCOUNTER — Encounter

## 2018-01-22 ENCOUNTER — Encounter: Payer: Self-pay | Admitting: Gastroenterology

## 2018-01-22 VITALS — BP 117/66 | HR 80 | Ht 61.0 in | Wt 131.6 lb

## 2018-01-22 DIAGNOSIS — K625 Hemorrhage of anus and rectum: Secondary | ICD-10-CM

## 2018-01-22 DIAGNOSIS — K219 Gastro-esophageal reflux disease without esophagitis: Secondary | ICD-10-CM

## 2018-01-22 DIAGNOSIS — R1084 Generalized abdominal pain: Secondary | ICD-10-CM

## 2018-01-23 NOTE — Progress Notes (Signed)
Claire Antigua, MD [address]  [place]  [place], [place] [address]  Main: [phone]  Fax: [phone]   Primary Care Physician: Steele Sizer, MD  Primary Gastroenterologist:  Dr. Vonda [name]  Chief Complaint  Patient presents with  . Follow-up    constipation    HPI: Claire [name] is a 47 y.o. female here for follow.  Reports abdominal pain.  Visited the ER for this on November 18 and was discharged with GYN and GI follow-up.  CT abdomen on November 18 showed gastric sleeve anatomy without complications.  Area of myometrium enhancement was noted and she was asked to follow-up with Gyn.  Patient reports pain occurred for 10 minutes 3 to 4 weeks ago, midepigastric region, sharp, nonradiating, 8/10.  Since then she reports cramping abdominal pain in the same area, 3/10, dull.  No dysphagia.  No weight loss.  She has been seen previously in our clinic and by myself, Dr. Allen Norris and Dr. Marius Ditch.  Last labs on November 21 shows normal CBC.  Normal liver enzymes.  Previous history: Pt. Has hx of gastric sleeve and fundoplication in 6237 and states her symptoms started in 2014. There is a whole binder of paperwork of previous notes she brings with her that show GI workup with previous GI physicians and include CT, EGDs and colonoscopies that have been normal. She has had hx of a cholecystectomy. She is on multiple medications and the only thing that she has seen that helps her is when she gets a colonoscopy prep. She states in June 2018 when she got her colonoscopy prepped and she had no symptoms all the way until October. She reports she is not normally constipated and has 1-2 bowel movements every day that she describes is formed, but after the bowel movement her abdominal discomfort does feel better. No nausea or vomiting.  June 2018 colonoscopy by Dr. Erlene Quan reports normal terminal ileum, normal mucosa entire colon.  Few diverticuli reported.  Impression  states "cyclical wall thickening seen pre-CT was likely an artifact".  Pathology showed no abnormalities.  June 2018 EGD reported medium size hiatal hernia.  Gastritis.  Gastric bypass changes.  Pathology showed mild chronic gastritis and mild foveolar hyperplasia negative for H. pylori.  Current Outpatient Medications  Medication Sig Dispense Refill  . albuterol (PROVENTIL HFA;VENTOLIN HFA) 108 (90 Base) MCG/ACT inhaler TAKE 2 PUFFS EVERY 6 HOURS AS NEEDED FOR WHEEZING OR SHORTNESS OF BREATH. (VENTOLIN NOT COVERED) 8.5 Inhaler 2  . ALPRAZolam (XANAX) 0.5 MG tablet Take 1 tablet (0.5 mg total) by mouth at bedtime as needed for anxiety. 15 tablet 0  . Ascorbic Acid (VITAMIN C) 1000 MG tablet Take 1,000 mg by mouth.    Marland Kitchen aspirin EC 81 MG tablet Take 81 mg by mouth daily.    Marland Kitchen atorvastatin (LIPITOR) 10 MG tablet Take 1 tablet (10 mg total) by mouth daily. 90 tablet 3  . calcium carbonate (OS-CAL) 600 MG TABS tablet Take 600 mg by mouth daily with breakfast.    . clobetasol ointment (TEMOVATE) 0.05 %   0  . cyclobenzaprine (FLEXERIL) 10 MG tablet Take 1 tablet (10 mg total) by mouth at bedtime. Prn 90 tablet 1  . fluconazole (DIFLUCAN) 150 MG tablet Take 1 tablet (150 mg total) by mouth every other day. 3 tablet 0  . gentamicin ointment (GARAMYCIN) 0.1 % APP IN NOSE TID PRF NASAL DRYNESS UTD  0  . lansoprazole (PREVACID) 30 MG capsule TAKE 1 CAPSULE DAILY 90  capsule 1  . loratadine (CLARITIN) 10 MG tablet Take 10 mg by mouth daily.    . meclizine (ANTIVERT) 25 MG tablet TK 1 T PO Q 8 H PRF VERTIGO  10  . mometasone (ELOCON) 0.1 % lotion APP TO ITCHY EARS BID FOR 12 DAYS. MAY REPEAT PRF ITCHY EARS  4  . mometasone (NASONEX) 50 MCG/ACT nasal spray USE 2 SPRAYS IN EACH NOSTRIL AT BEDTIME 48 g 1  . montelukast (SINGULAIR) 10 MG tablet TAKE 1 TABLET(10 MG) BY MOUTH AT BEDTIME 90 tablet 1  . pantoprazole (PROTONIX) 40 MG tablet Take 1 tablet (40 mg total) by mouth daily. 30 tablet 1  . Potassium 99 MG  TABS Take by mouth.    . promethazine (PHENERGAN) 12.5 MG tablet TAKE 1 TABLET BY MOUTH EVERY 6 HOURS AS NEEDED FOR MIGRAINES WITH NAUSEA 20 tablet 0  . Semaglutide (OZEMPIC) 1 MG/DOSE SOPN Inject 1 mg into the skin once a week. 9 mL 1  . sertraline (ZOLOFT) 100 MG tablet TAKE 1 TABLET BY MOUTH EVERY DAY WITH BREAKFAST STOP ZOLPIDEM  0  . SUMAtriptan (IMITREX) 100 MG tablet No more than 2 in 24 hours 9 tablet 1  . traMADol (ULTRAM) 50 MG tablet Take 1 tablet (50 mg total) by mouth every 6 (six) hours as needed. 20 tablet 0  . traZODone (DESYREL) 100 MG tablet START WITH 1-2 TABLETS BY MOUTH NIGHTLY AS NEEDED FOR SLEEP  0  . vitamin B-12 (CYANOCOBALAMIN) 100 MCG tablet Take 100 mcg by mouth as directed.     . Vitamin D, Ergocalciferol, (DRISDOL) 50000 units CAPS capsule Take 1 capsule (50,000 Units total) by mouth every 7 (seven) days. TAKE 1 CAPSULE EVERY 7 DAYS 12 capsule 1  . zonisamide (ZONEGRAN) 50 MG capsule Take 3 capsules by mouth daily.  2  . ferrous sulfate 325 (65 FE) MG EC tablet Take 325 mg by mouth daily with breakfast.     . nystatin-triamcinolone ointment (MYCOLOG) Apply 1 application 2 (two) times daily topically. Mix at home with A&D ointment apply with each bowel movement (Patient not taking: Reported on 01/22/2018) 60 g 0  . zolpidem (AMBIEN) 10 MG tablet Must be [employer] (Patient not taking: Reported on 01/22/2018) 90 tablet 0   Current Facility-Administered Medications  Medication Dose Route Frequency Provider Last Rate Last Dose  . cyanocobalamin ((VITAMIN B-12)) injection 1,000 mcg  1,000 mcg Intramuscular Q30 days Steele Sizer, MD   1,000 mcg at 10/30/17 1334    Allergies as of 01/22/2018 - Review Complete 01/22/2018  Allergen Reaction Noted  . Gabapentin  05/16/2016  . Linzess [linaclotide]  11/11/2015  . Other Hives and Other (See Comments) 06/30/2012  . Cymbalta [duloxetine hcl] Palpitations 09/24/2014  . Vicodin [hydrocodone-acetaminophen] Itching  07/07/2012    ROS:  General: Negative for anorexia, weight loss, fever, chills, fatigue, weakness. ENT: Negative for hoarseness, difficulty swallowing , nasal congestion. CV: Negative for chest pain, angina, palpitations, dyspnea on exertion, peripheral edema.  Respiratory: Negative for dyspnea at rest, dyspnea on exertion, cough, sputum, wheezing.  GI: See history of present illness. GU:  Negative for dysuria, hematuria, urinary incontinence, urinary frequency, nocturnal urination.  Endo: Negative for unusual weight change.    Physical Examination:   BP 117/66   Pulse 80   Ht 5\' 1"  (1.549 m)   Wt 131 lb 9.6 oz (59.7 kg)   LMP 01/06/2018   BMI 24.87 kg/m   General: Well-nourished, well-developed in no acute distress.  Eyes: No  icterus. Conjunctivae pink. Mouth: Oropharyngeal mucosa moist and pink , no lesions erythema or exudate. Neck: Supple, Trachea midline Abdomen: Bowel sounds are normal, nontender, nondistended, no hepatosplenomegaly or masses, no abdominal bruits or hernia , no rebound or guarding.   Extremities: No lower extremity edema. No clubbing or deformities. Neuro: Alert and oriented x 3.  Grossly intact. Skin: Warm and dry, no jaundice.   Psych: Alert and cooperative, normal mood and affect.   Labs: CMP     Component Value Date/Time   NA 139 01/09/2018 1317   NA 140 09/11/2017 0812   K 3.8 01/09/2018 1317   CL 106 01/09/2018 1317   CO2 26 01/09/2018 1317   GLUCOSE 98 01/09/2018 1317   BUN 7 01/09/2018 1317   BUN 8 09/11/2017 0812   CREATININE 0.52 01/09/2018 1317   CREATININE 0.78 11/19/2016 0921   CALCIUM 8.7 (L) 01/09/2018 1317   PROT 6.3 (L) 01/09/2018 1317   PROT 6.3 09/11/2017 0812   ALBUMIN 3.6 01/09/2018 1317   ALBUMIN 4.1 09/11/2017 0812   AST 25 01/09/2018 1317   ALT 37 01/09/2018 1317   ALKPHOS 74 01/09/2018 1317   BILITOT 0.7 01/09/2018 1317   BILITOT 0.5 09/11/2017 0812   GFRNONAA >60 01/09/2018 1317   GFRNONAA 92 11/19/2016 0921     GFRAA >60 01/09/2018 1317   GFRAA 107 11/19/2016 0921   Lab Results  Component Value Date   WBC 6.5 01/09/2018   HGB 12.2 01/09/2018   HCT 38.1 01/09/2018   MCV 95.0 01/09/2018   PLT 248 01/09/2018    Imaging Studies: Ct Abdomen Pelvis W Contrast  Result Date: 01/06/2018 CLINICAL DATA:  Abdominal pain and left lower quadrant starting at 10:30. EXAM: CT ABDOMEN AND PELVIS WITH CONTRAST TECHNIQUE: Multidetector CT imaging of the abdomen and pelvis was performed using the standard protocol following bolus administration of intravenous contrast. CONTRAST:  141mL ISOVUE-300 IOPAMIDOL (ISOVUE-300) INJECTION 61% COMPARISON:  12/17/2016 FINDINGS: Lower chest: Normal heart size. No pericardial effusion. Small hiatal hernia. Hepatobiliary: No focal liver abnormality is seen. Status post cholecystectomy. No biliary dilatation. Pancreas: Unremarkable. No pancreatic ductal dilatation or surrounding inflammatory changes. Spleen: Normal in size without focal abnormality. Adrenals/Urinary Tract: Adrenal glands are unremarkable. Symmetric cortical enhancement of both kidneys without obstructive uropathy. 3 mm left interpolar renal calculus is noted. Suspected calcification in the lower pole the right kidney is not apparent and may have represented contrast within a calyx previously. No hydroureteronephrosis. The urinary bladder is unremarkable for the degree of distention. Stomach/Bowel: Prior gastric sleeve procedure without complicating features. No fluid extravasation. No bowel obstruction or inflammation. Vascular/Lymphatic: Aortic atherosclerosis. No enlarged abdominal or pelvic lymph nodes. Reproductive: Mild intraparenchymal enhancement of the anterior uterine myometrium, nonspecific possibly associated with a fibroid. Small adjacent area of hypodensity noted within the region of enhancement may represent some necrosis. Patient has a negative urine pregnancy test prior to this study. Post right  oophorectomy. No adnexal masses or adenopathy. Other: No abdominal wall hernia or abnormality. No abdominopelvic ascites. Musculoskeletal: No acute or significant osseous findings. IMPRESSION: 1. Status post gastric sleeve procedure without complicating features. No bowel obstruction or inflammation. 2. 3 mm nonobstructing left interpolar renal calculus. 3. Mild 2.3 cm area of intraparenchymal enhancement of the anterior uterine myometrium, nonspecific possibly associated with a fibroid. Status post right oophorectomy. Aortic Atherosclerosis (ICD10-I70.0). Electronically Signed   By: Ashley Royalty M.D.   On: 01/06/2018 17:11    Assessment and Plan:   Claire [name] is a  47 y.o. y/o female with chronic history of abdominal pain and negative extensive work-up in the past.  Now with recent change in abdominal pain that led to an ER visit due to sharp midepigastric abdominal pain  Patient has been to the ER twice for the above pain She states she has followed up with gynecology in regards to CT finding and they did a pelvic ultrasound and stated that her pain in the midepigastric region is not due to GYN issues and she should have GI evaluation  Due to changes in her pain requiring 2 separate ER visits patient would like further evaluation We discussed the options of endoscopic evaluation versus conservative management in detail and discussed the risks and benefits of both options.  Patient would like to proceed with endoscopic evaluation and understands its risks and benefits in detail  EGD would allow [place] to evaluate for any peptic ulcer disease as patient reports continued pain since that episode occurred that led to the ER visit  Other etiologies would be GERD Patient is already on PPI and this is not helping her pain  I have discussed alternative options, risks & benefits,  which include, but are not limited to, bleeding, infection, perforation,respiratory complication & drug reaction.  The  patient agrees with this plan & written consent will be obtained.     Dr Claire [name]

## 2018-01-27 ENCOUNTER — Encounter: Payer: Self-pay | Admitting: *Deleted

## 2018-01-27 ENCOUNTER — Other Ambulatory Visit: Payer: Self-pay

## 2018-01-27 DIAGNOSIS — R1084 Generalized abdominal pain: Secondary | ICD-10-CM

## 2018-01-27 DIAGNOSIS — K625 Hemorrhage of anus and rectum: Secondary | ICD-10-CM

## 2018-01-28 ENCOUNTER — Ambulatory Visit
Admission: RE | Admit: 2018-01-28 | Discharge: 2018-01-28 | Disposition: A | Discharge status: Home / Self Care | Payer: BLUE CROSS/BLUE SHIELD | Attending: Gastroenterology | Admitting: Gastroenterology

## 2018-01-28 ENCOUNTER — Ambulatory Visit: Payer: BLUE CROSS/BLUE SHIELD | Admitting: Anesthesiology

## 2018-01-28 ENCOUNTER — Encounter
Admission: RE | Disposition: A | Discharge status: Home / Self Care | Payer: Self-pay | Source: Home / Self Care | Attending: Gastroenterology

## 2018-01-28 DIAGNOSIS — M1990 Unspecified osteoarthritis, unspecified site: Secondary | ICD-10-CM | POA: Diagnosis not present

## 2018-01-28 DIAGNOSIS — K219 Gastro-esophageal reflux disease without esophagitis: Secondary | ICD-10-CM | POA: Insufficient documentation

## 2018-01-28 DIAGNOSIS — K319 Disease of stomach and duodenum, unspecified: Secondary | ICD-10-CM | POA: Diagnosis not present

## 2018-01-28 DIAGNOSIS — R1013 Epigastric pain: Secondary | ICD-10-CM

## 2018-01-28 DIAGNOSIS — E119 Type 2 diabetes mellitus without complications: Secondary | ICD-10-CM | POA: Insufficient documentation

## 2018-01-28 DIAGNOSIS — K228 Other specified diseases of esophagus: Secondary | ICD-10-CM

## 2018-01-28 DIAGNOSIS — K9189 Other postprocedural complications and disorders of digestive system: Secondary | ICD-10-CM | POA: Diagnosis not present

## 2018-01-28 DIAGNOSIS — J45909 Unspecified asthma, uncomplicated: Secondary | ICD-10-CM | POA: Insufficient documentation

## 2018-01-28 DIAGNOSIS — G4730 Sleep apnea, unspecified: Secondary | ICD-10-CM | POA: Insufficient documentation

## 2018-01-28 DIAGNOSIS — Z9884 Bariatric surgery status: Secondary | ICD-10-CM | POA: Diagnosis not present

## 2018-01-28 DIAGNOSIS — Z7951 Long term (current) use of inhaled steroids: Secondary | ICD-10-CM | POA: Insufficient documentation

## 2018-01-28 DIAGNOSIS — F419 Anxiety disorder, unspecified: Secondary | ICD-10-CM | POA: Insufficient documentation

## 2018-01-28 DIAGNOSIS — K625 Hemorrhage of anus and rectum: Secondary | ICD-10-CM

## 2018-01-28 DIAGNOSIS — R1310 Dysphagia, unspecified: Secondary | ICD-10-CM | POA: Diagnosis not present

## 2018-01-28 DIAGNOSIS — Z79899 Other long term (current) drug therapy: Secondary | ICD-10-CM | POA: Diagnosis not present

## 2018-01-28 DIAGNOSIS — Z7982 Long term (current) use of aspirin: Secondary | ICD-10-CM | POA: Diagnosis not present

## 2018-01-28 DIAGNOSIS — R1084 Generalized abdominal pain: Secondary | ICD-10-CM | POA: Diagnosis not present

## 2018-01-28 DIAGNOSIS — E559 Vitamin D deficiency, unspecified: Secondary | ICD-10-CM | POA: Insufficient documentation

## 2018-01-28 DIAGNOSIS — K449 Diaphragmatic hernia without obstruction or gangrene: Secondary | ICD-10-CM | POA: Insufficient documentation

## 2018-01-28 DIAGNOSIS — R109 Unspecified abdominal pain: Secondary | ICD-10-CM | POA: Diagnosis not present

## 2018-01-28 DIAGNOSIS — F329 Major depressive disorder, single episode, unspecified: Secondary | ICD-10-CM | POA: Diagnosis not present

## 2018-01-28 DIAGNOSIS — K589 Irritable bowel syndrome without diarrhea: Secondary | ICD-10-CM | POA: Insufficient documentation

## 2018-01-28 DIAGNOSIS — R1319 Other dysphagia: Secondary | ICD-10-CM

## 2018-01-28 DIAGNOSIS — K2289 Other specified disease of esophagus: Secondary | ICD-10-CM

## 2018-01-28 DIAGNOSIS — Z87891 Personal history of nicotine dependence: Secondary | ICD-10-CM | POA: Diagnosis not present

## 2018-01-28 DIAGNOSIS — Z8249 Family history of ischemic heart disease and other diseases of the circulatory system: Secondary | ICD-10-CM | POA: Insufficient documentation

## 2018-01-28 HISTORY — PX: ESOPHAGOGASTRODUODENOSCOPY (EGD) WITH PROPOFOL: SHX5813

## 2018-01-28 HISTORY — PX: COLONOSCOPY WITH PROPOFOL: SHX5780

## 2018-01-28 HISTORY — DX: Unspecified osteoarthritis, unspecified site: M19.90

## 2018-01-28 SURGERY — COLONOSCOPY WITH PROPOFOL
Anesthesia: General | Site: Throat

## 2018-01-28 MED ORDER — PROPOFOL 10 MG/ML IV BOLUS
INTRAVENOUS | Status: DC | PRN
  Administered 2018-01-28 (×3): 30 mg via INTRAVENOUS
  Administered 2018-01-28: 20 mg via INTRAVENOUS
  Administered 2018-01-28: 70 mg via INTRAVENOUS
  Administered 2018-01-28: 30 mg via INTRAVENOUS
  Administered 2018-01-28 (×5): 20 mg via INTRAVENOUS
  Administered 2018-01-28 (×2): 30 mg via INTRAVENOUS
  Administered 2018-01-28: 20 mg via INTRAVENOUS
  Administered 2018-01-28 (×2): 30 mg via INTRAVENOUS
  Administered 2018-01-28: 20 mg via INTRAVENOUS
  Administered 2018-01-28 (×2): 30 mg via INTRAVENOUS
  Administered 2018-01-28 (×2): 20 mg via INTRAVENOUS

## 2018-01-28 MED ORDER — LACTATED RINGERS IV SOLN
INTRAVENOUS | Status: DC
  Administered 2018-01-28: 10:00:00 via INTRAVENOUS

## 2018-01-28 MED ORDER — ACETAMINOPHEN 325 MG PO TABS: 325.0000 mg | ORAL_TABLET | Freq: Once | ORAL | Status: DC

## 2018-01-28 MED ORDER — GLYCOPYRROLATE 0.2 MG/ML IJ SOLN
INTRAMUSCULAR | Status: DC | PRN
  Administered 2018-01-28: 0.2 mg via INTRAVENOUS

## 2018-01-28 MED ORDER — STERILE WATER FOR IRRIGATION IR SOLN
Status: DC | PRN
  Administered 2018-01-28 (×2)

## 2018-01-28 MED ORDER — LIDOCAINE HCL (CARDIAC) PF 100 MG/5ML IV SOSY
PREFILLED_SYRINGE | INTRAVENOUS | Status: DC | PRN
  Administered 2018-01-28: 40 mg via INTRAVENOUS

## 2018-01-28 MED ORDER — SODIUM CHLORIDE 0.9 % IV SOLN: INTRAVENOUS | Status: DC

## 2018-01-28 MED ORDER — ACETAMINOPHEN 160 MG/5ML PO SOLN: 325.0000 mg | Freq: Once | ORAL | Status: DC

## 2018-01-28 SURGICAL SUPPLY — 7 items
BLOCK BITE 60FR ADLT L/F GRN (MISCELLANEOUS) ×4 IMPLANT
CANISTER SUCT 1200ML W/VALVE (MISCELLANEOUS) ×4 IMPLANT
FORCEPS BIOP RAD 4 LRG CAP 4 (CUTTING FORCEPS) ×4 IMPLANT
GOWN CVR UNV OPN BCK APRN NK (MISCELLANEOUS) ×4 IMPLANT
GOWN ISOL THUMB LOOP REG UNIV (MISCELLANEOUS) ×4
KIT ENDO PROCEDURE OLY (KITS) ×4 IMPLANT
WATER STERILE IRR 250ML POUR (IV SOLUTION) ×4 IMPLANT

## 2018-01-28 NOTE — Anesthesia Preprocedure Evaluation (Signed)
Anesthesia Evaluation  Patient identified by MRN, date of birth, ID band Patient awake    Reviewed: Allergy & Precautions, H&P , NPO status , Patient's Chart, lab work & pertinent test results  Airway Mallampati: II  TM Distance: >3 FB Neck ROM: full    Dental no notable dental hx.    Pulmonary asthma , former smoker,    Pulmonary exam normal breath sounds clear to auscultation       Cardiovascular Normal cardiovascular exam Rhythm:regular Rate:Normal     Neuro/Psych  Headaches,    GI/Hepatic GERD  ,  Endo/Other    Renal/GU      Musculoskeletal   Abdominal   Peds  Hematology   Anesthesia Other Findings   Reproductive/Obstetrics                             Anesthesia Physical Anesthesia Plan  ASA: II  Anesthesia Plan: General   Post-op Pain Management:    Induction: Intravenous  PONV Risk Score and Plan: 3 and Propofol infusion and Treatment may vary due to age or medical condition  Airway Management Planned: Natural Airway  Additional Equipment:   Intra-op Plan:   Post-operative Plan:   Informed Consent: I have reviewed the patients History and Physical, chart, labs and discussed the procedure including the risks, benefits and alternatives for the proposed anesthesia with the patient or authorized representative who has indicated his/her understanding and acceptance.     Plan Discussed with: CRNA  Anesthesia Plan Comments:         Anesthesia Quick Evaluation

## 2018-01-28 NOTE — Transfer of Care (Signed)
Immediate Anesthesia Transfer of Care Note  Patient: [name]  Procedure(s) Performed: COLONOSCOPY WITH PROPOFOL (N/A Rectum) ESOPHAGOGASTRODUODENOSCOPY (EGD) WITH BIOPSIES (N/A Throat)  Patient Location: PACU  Anesthesia Type: General  Level of Consciousness: awake, alert  and patient cooperative  Airway and Oxygen Therapy: Patient Spontanous Breathing and Patient connected to supplemental oxygen  Post-op Assessment: Post-op Vital signs reviewed, Patient's Cardiovascular Status Stable, Respiratory Function Stable, Patent Airway and No signs of Nausea or vomiting  Post-op Vital Signs: Reviewed and stable  Complications: No apparent anesthesia complications

## 2018-01-28 NOTE — Anesthesia Procedure Notes (Signed)
Performed by: Ineta Sinning, CRNA Pre-anesthesia Checklist: Patient identified, Emergency Drugs available, Suction available, Timeout performed and Patient being monitored Patient Re-evaluated:Patient Re-evaluated prior to induction Oxygen Delivery Method: Nasal cannula Placement Confirmation: positive ETCO2       

## 2018-01-28 NOTE — Op Note (Signed)
[facility] Gastroenterology Patient Name: Claire [name] Procedure Date: 01/28/2018 10:20 AM MRN: [id number] Account #: [id number] Date of Birth: 04/22/1970 Admit Type: Outpatient Age: 47 Room: [facility] OR ROOM 01 Gender: Female Note Status: Finalized Procedure:            Upper GI endoscopy Indications:          Epigastric abdominal pain, Dysphagia Providers:            Minna Dumire B. Bonna Gains MD, MD Referring MD:         Bethena Roys. Sowles, MD (Referring MD) Medicines:            Monitored Anesthesia Care Complications:        No immediate complications. Procedure:            Pre-Anesthesia Assessment:                       - The risks and benefits of the procedure and the                        sedation options and risks were discussed with the                        patient. All questions were answered and informed                        consent was obtained.                       - Patient identification and proposed procedure were                        verified prior to the procedure.                       - ASA Grade Assessment: II - A patient with mild                        systemic disease.                       After obtaining informed consent, the endoscope was                        passed under direct vision. Throughout the procedure,                        the patient's blood pressure, pulse, and oxygen                        saturations were monitored continuously. The was                        introduced through the mouth, and advanced to the                        second part of duodenum. The upper GI endoscopy was                        accomplished with ease. The patient tolerated the  procedure well. Findings:      One tongue of salmon-colored mucosa was present. No other visible       abnormalities were present. Mucosa was biopsied with a cold forceps for       histology in a targeted manner and in 4 quadrants at the      gastroesophageal junction. Biopsies were obtained from the proximal and       distal esophagus with cold forceps for histology of suspected       eosinophilic esophagitis.      The exam of the esophagus was otherwise normal.      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 This was       characterized by healthy appearing mucosa. Biopsies were taken with a       cold forceps for Helicobacter pylori testing.      A medium-sized hiatal hernia was present.      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 The       wrap appeared not intact.      The examined duodenum was normal. Impression:           - Salmon-colored mucosa suspicious for short-segment                        Barrett's esophagus. Biopsied.                       - A sleeve gastrectomy was found, characterized by                        healthy appearing mucosa. Biopsied.                       - Medium-sized hiatal hernia.                       - A Nissen fundoplication was found. The wrap appears                        not intact.                       - Normal examined duodenum. Recommendation:       - Await pathology results.                       - Continue present medications.                       - Discharge patient to home.                       - Return to primary care physician as previously                        scheduled.                       - Refer to a surgeon to discuss non-intact                        Fundoplication wrap.                       - The  findings and recommendations were discussed with                        the patient.                       - The findings and recommendations were discussed with                        the patient's family. Procedure Code(s):    --- Professional ---                       [phone], Esophagogastroduodenoscopy, flexible, transoral;                        with biopsy, single or multiple Diagnosis Code(s):    --- Professional  ---                       K22.8, Other specified diseases of esophagus                       Z98.84, Bariatric surgery status                       K44.9, Diaphragmatic hernia without obstruction or                        gangrene                       K91.89, Other postprocedural complications and                        disorders of digestive system                       R10.13, Epigastric pain                       R13.10, Dysphagia, unspecified CPT copyright 2018 American Medical Association. All rights reserved. The codes documented in this report are preliminary and upon coder review may  be revised to meet current compliance requirements.  [name], MD Margretta Sidle B. Bonna Gains MD, MD 01/28/2018 11:09:46 AM This report has been signed electronically. Number of Addenda: 0 Note Initiated On: 01/28/2018 10:20 AM Total Procedure Duration: 0 hours 10 minutes 48 seconds  Estimated Blood Loss: Estimated blood loss: none.      [facility]

## 2018-01-28 NOTE — Op Note (Signed)
[facility] Gastroenterology Patient Name: Claire [name] Procedure Date: 01/28/2018 10:19 AM MRN: [id number] Account #: [id number] Date of Birth: 27-Sep-1970 Admit Type: Outpatient Age: 47 Room: [facility] OR ROOM 01 Gender: Female Note Status: Finalized Procedure:            Colonoscopy Indications:          Abdominal pain Providers:            Sanjuanita Condrey B. Bonna Gains MD, MD Medicines:            Monitored Anesthesia Care Complications:        No immediate complications. Procedure:            Pre-Anesthesia Assessment:                       - Prior to the procedure, a History and Physical was                        performed, and patient medications, allergies and                        sensitivities were reviewed. The patient's tolerance of                        previous anesthesia was reviewed.                       - The risks and benefits of the procedure and the                        sedation options and risks were discussed with the                        patient. All questions were answered and informed                        consent was obtained.                       - Patient identification and proposed procedure were                        verified prior to the procedure by the physician, the                        nurse, the anesthetist and the technician. The                        procedure was verified in the pre-procedure area in the                        procedure room in the endoscopy suite.                       - ASA Grade Assessment: II - A patient with mild                        systemic disease.                       - After reviewing the risks and benefits, the patient  was deemed in satisfactory condition to undergo the                        procedure.                       After obtaining informed consent, the colonoscope was                        passed under direct vision. Throughout the procedure,            the patient's blood pressure, pulse, and oxygen                        saturations were monitored continuously. The was                        introduced through the anus and advanced to the the                        cecum, identified by appendiceal orifice and ileocecal                        valve. The colonoscopy was performed with ease. The                        patient tolerated the procedure well. The quality of                        the bowel preparation was good. Findings:      The perianal and digital rectal examinations were normal.      The rectum, sigmoid colon, descending colon, transverse colon, ascending       colon and cecum appeared normal.      The retroflexed view of the distal rectum and anal verge was normal and       showed no anal or rectal abnormalities. Impression:           - The rectum, sigmoid colon, descending colon,                        transverse colon, ascending colon and cecum are normal.                       - The distal rectum and anal verge are normal on                        retroflexion view.                       - No specimens collected. Recommendation:       - Discharge patient to home.                       - Resume previous diet.                       - Continue present medications.                       - Repeat colonoscopy in 5 years for screening purposes.                       -  Return to primary care physician as previously                        scheduled.                       - The findings and recommendations were discussed with                        the patient.                       - The findings and recommendations were discussed with                        the patient's family. Procedure Code(s):    --- Professional ---                       [phone], Colonoscopy, flexible; diagnostic, including                        collection of specimen(s) by brushing or washing, when                        performed (separate  procedure) Diagnosis Code(s):    --- Professional ---                       R10.9, Unspecified abdominal pain CPT copyright 2018 American Medical Association. All rights reserved. The codes documented in this report are preliminary and upon coder review may  be revised to meet current compliance requirements.  [name], MD Margretta Sidle B. Bonna Gains MD, MD 01/28/2018 11:04:07 AM This report has been signed electronically. Number of Addenda: 0 Note Initiated On: 01/28/2018 10:19 AM Scope Withdrawal Time: 0 hours 14 minutes 56 seconds  Total Procedure Duration: 0 hours 19 minutes 47 seconds       [facility]

## 2018-01-28 NOTE — Anesthesia Postprocedure Evaluation (Signed)
Anesthesia Post Note  Patient: Claire [name]  Procedure(s) Performed: COLONOSCOPY WITH PROPOFOL (N/A Rectum) ESOPHAGOGASTRODUODENOSCOPY (EGD) WITH BIOPSIES (N/A Throat)  Patient location during evaluation: PACU Anesthesia Type: General Level of consciousness: awake and alert and oriented Pain management: satisfactory to patient Vital Signs Assessment: post-procedure vital signs reviewed and stable Respiratory status: spontaneous breathing, nonlabored ventilation and respiratory function stable Cardiovascular status: blood pressure returned to baseline and stable Postop Assessment: Adequate PO intake and No signs of nausea or vomiting Anesthetic complications: no    Raliegh Ip

## 2018-01-28 NOTE — H&P (Signed)
[name], MD [address], [place], [place], [name], [address] [address], [place], [place], [name], [address] Phone: [phone]  Fax: [phone]  Primary Care Physician:  Steele Sizer, MD   Pre-Procedure History & Physical: HPI:  Claire [name] is a 47 y.o. female is here for a colonoscopy and EGD.   Past Medical History:  Diagnosis Date  . Anemia   . Anxiety   . Arthritis    hands, hips  . Asthma   . Bilateral leg cramps   . Depression   . Diabetes mellitus without complication ([place])    history no longer a problem since weight loss surgery  . DJD (degenerative joint disease)   . GERD (gastroesophageal reflux disease)   . Headache    Migraines  . IBS (irritable bowel syndrome)   . Kidney stone   . Low serum vitamin B12   . Lung nodules   . Migraine    down to approx 4x/mo since starting meds  . Muscle spasm   . Psoriasis    vaginal area  . Seasonal allergies   . Sleep apnea    history no longer a problem since weight loss surgery  . Vitamin D deficiency     Past Surgical History:  Procedure Laterality Date  . BLADDER SURGERY  2010  . BREAST BIOPSY Right 2014   stereotatic biopsy  . CHOLECYSTECTOMY  2010  . COLONOSCOPY  2014    Done at [facility]  . DILITATION & CURRETTAGE/HYSTROSCOPY WITH NOVASURE ABLATION N/A 06/16/2015   Procedure: DILATATION & CURETTAGE/HYSTEROSCOPY WITH NOVASURE ABLATION;  Surgeon: Brien Few, MD;  Location: [facility];  Service: Gynecology;  Laterality: N/A;  . [place] RESECTION  2012  . LAPAROSCOPY N/A 06/16/2015   Procedure: LAPAROSCOPY DIAGNOSTIC;  Surgeon: Brien Few, MD;  Location: [facility];  Service: Gynecology;  Laterality: N/A;  . LYSIS OF ADHESION N/A 06/16/2015   Procedure: LYSIS OF ADHESION;  Surgeon: Brien Few, MD;  Location: [facility];  Service: Gynecology;  Laterality: N/A;  . PLANTAR FASCIA SURGERY Bilateral   . ROBOTIC ASSISTED SALPINGO OOPHERECTOMY Right 06/16/2015   Procedure: ROBOTIC ASSISTED SALPINGO OOPHORECTOMY, EXCISION OF RIGHT CUL DE [place] MASS;  Surgeon: Brien Few, MD;  Location: [facility];  Service: Gynecology;  Laterality: Right;  . TONSILLECTOMY    . UPPER GI ENDOSCOPY      Prior to Admission medications   Medication Sig Start Date End Date Taking? Authorizing Provider  albuterol (PROVENTIL HFA;VENTOLIN HFA) 108 (90 Base) MCG/ACT inhaler TAKE 2 PUFFS EVERY 6 HOURS AS NEEDED FOR WHEEZING OR SHORTNESS OF BREATH. (VENTOLIN NOT COVERED) 12/04/17   Hubbard Hartshorn, FNP  ALPRAZolam Duanne Moron) 0.5 MG tablet Take 1 tablet (0.5 mg total) by mouth at bedtime as needed for anxiety. 09/04/17   Steele Sizer, MD  Ascorbic Acid (VITAMIN C) 1000 MG tablet Take 1,000 mg by mouth.    [provider]  aspirin EC 81 MG tablet Take 81 mg by mouth daily.    [provider]  atorvastatin (LIPITOR) 10 MG tablet Take 1 tablet (10 mg total) by mouth daily. 02/20/17   Steele Sizer, MD  calcium carbonate (OS-CAL) 600 MG TABS tablet Take 600 mg by mouth daily with breakfast.    [provider]  clobetasol ointment (TEMOVATE) 0.05 %  06/06/15   [provider]  cyclobenzaprine (FLEXERIL) 10 MG tablet Take 1 tablet (10 mg total) by mouth at bedtime. Prn 11/11/15   Steele Sizer, MD  ferrous  sulfate 325 (65 FE) MG EC tablet Take 325 mg by mouth daily with breakfast.     [provider]  fluconazole (DIFLUCAN) 150 MG tablet Take 1 tablet (150 mg total) by mouth every other day. 11/25/17   Steele Sizer, MD  gentamicin ointment (GARAMYCIN) 0.1 % APP IN NOSE TID PRF NASAL DRYNESS UTD 01/11/17   Beverly Gust, MD  lansoprazole (PREVACID) 30 MG capsule TAKE 1 CAPSULE DAILY 12/02/17   Steele Sizer, MD  loratadine (CLARITIN) 10 MG tablet Take 10 mg by mouth daily.    [provider]  meclizine (ANTIVERT) 25 MG tablet TK 1 T PO Q 8 H PRF VERTIGO 12/03/16   Beverly Gust, MD  mometasone (ELOCON) 0.1 % lotion APP TO ITCHY  EARS BID FOR 12 DAYS. MAY REPEAT PRF ITCHY EARS 01/09/17   Beverly Gust, MD  mometasone (NASONEX) 50 MCG/ACT nasal spray USE 2 SPRAYS IN EACH NOSTRIL AT BEDTIME 08/18/17   Sowles, Drue Stager, MD  montelukast (SINGULAIR) 10 MG tablet TAKE 1 TABLET(10 MG) BY MOUTH AT BEDTIME 09/04/17   Steele Sizer, MD  nystatin-triamcinolone ointment [facility]) Apply 1 application 2 (two) times daily topically. Mix at home with A&D ointment apply with each bowel movement Patient not taking: Reported on 01/22/2018 12/31/16   Steele Sizer, MD  pantoprazole (PROTONIX) 40 MG tablet Take 1 tablet (40 mg total) by mouth daily. 01/09/18 01/09/19  Harvest Dark, MD  Potassium 99 MG TABS Take by mouth.    [provider]  promethazine (PHENERGAN) 12.5 MG tablet TAKE 1 TABLET BY MOUTH EVERY 6 HOURS AS NEEDED FOR MIGRAINES WITH NAUSEA 11/25/17   Sowles, Drue Stager, MD  Semaglutide (OZEMPIC) 1 MG/DOSE SOPN Inject 1 mg into the skin once a week. 09/04/17   Steele Sizer, MD  sertraline (ZOLOFT) 100 MG tablet TAKE 1 TABLET BY MOUTH EVERY DAY WITH BREAKFAST STOP ZOLPIDEM 01/14/18   [provider]  SUMAtriptan (IMITREX) 100 MG tablet No more than 2 in 24 hours 08/23/17   Steele Sizer, MD  traMADol (ULTRAM) 50 MG tablet Take 1 tablet (50 mg total) by mouth every 6 (six) hours as needed. 01/09/18   Harvest Dark, MD  traZODone (DESYREL) 100 MG tablet START WITH 1-2 TABLETS BY MOUTH NIGHTLY AS NEEDED FOR SLEEP 01/07/18   [provider]  vitamin B-12 (CYANOCOBALAMIN) 100 MCG tablet Take 100 mcg by mouth as directed.     [provider]  Vitamin D, Ergocalciferol, (DRISDOL) 50000 units CAPS capsule Take 1 capsule (50,000 Units total) by mouth every 7 (seven) days. TAKE 1 CAPSULE EVERY 7 DAYS 12/02/17   Steele Sizer, MD  zolpidem (AMBIEN) 10 MG tablet Must be [employer] Patient not taking: Reported on 01/22/2018 11/29/17   Steele Sizer, MD  zonisamide (ZONEGRAN) 50 MG capsule Take  3 capsules by mouth daily. 02/10/16   Orie Rout, MD    Allergies as of 01/27/2018 - Review Complete 01/27/2018  Allergen Reaction Noted  . Gabapentin  05/16/2016  . Linzess [linaclotide]  11/11/2015  . Other Hives and Other (See Comments) 06/30/2012  . Adhesive [tape] Rash 01/27/2018  . Cymbalta [duloxetine hcl] Palpitations 09/24/2014  . Vicodin [hydrocodone-acetaminophen] Itching 07/07/2012    Family History  Problem Relation Age of Onset  . Heart attack Mother   . Stroke Mother   . Multiple myeloma Mother   . Hyperlipidemia Father   . Heart attack Sister   . Heart attack Maternal Uncle   . Heart attack Paternal Grandfather   . Breast  cancer Paternal Aunt   . Breast cancer Paternal Grandmother   . Lung cancer Maternal Grandfather     Social History   Socioeconomic History  . Marital status: Married    Spouse name: Not on file  . Number of children: 3  . Years of education: Not on file  . Highest education level: Some college, no degree  Occupational History  . Not on file  Social Needs  . Financial resource strain: Not very hard  . Food insecurity:    Worry: Never true    Inability: Never true  . Transportation needs:    Medical: No    Non-medical: No  Tobacco Use  . Smoking status: Former Smoker    Packs/day: 1.00    Years: 10.00    Pack years: 10.00    Types: Cigarettes    Last attempt to quit: 02/19/1993    Years since quitting: 24.9  . Smokeless tobacco: Never Used  Substance and Sexual Activity  . Alcohol use: Yes    Alcohol/week: 0.0 standard drinks    Comment: occasionally  . Drug use: No  . Sexual activity: Yes    Partners: Male    Comment: last sex 04 Jan 2018  Lifestyle  . Physical activity:    Days per week: 2 days    Minutes per session: 30 min  . Stress: Very much  Relationships  . Social connections:    Talks on phone: Not on file    Gets together: Not on file    Attends religious service: Not on file    Active member of  club or organization: Not on file    Attends meetings of clubs or organizations: Not on file    Relationship status: Not on file  . Intimate partner violence:    Fear of current or ex partner: No    Emotionally abused: No    Physically abused: No    Forced sexual activity: No  Other Topics Concern  . Not on file  Social History Narrative  . Not on file    Review of Systems: See HPI, otherwise negative ROS  Physical Exam: Ht _0  (1.549 m)   Wt 59.9 kg   LMP 01/06/2018   BMI 24.94 kg/m  General:   Alert,  pleasant and cooperative in NAD Head:  Normocephalic and atraumatic. Neck:  Supple; no masses or thyromegaly. Lungs:  Clear throughout to auscultation, normal respiratory effort.    Heart:  +S1, +S2, Regular rate and rhythm, No edema. Abdomen:  Soft, nontender and nondistended. Normal bowel sounds, without guarding, and without rebound.   Neurologic:  Alert and  oriented x4;  grossly normal neurologically.  Impression/Plan: Claire [name] is here for a colonoscopy to be performed for abdominal pain, and EGD for midepigastric pain, dysphagia  Risks, benefits, limitations, and alternatives regarding the procedures have been reviewed with the patient.  Questions have been answered.  All parties agreeable.   Virgel Manifold, MD  01/28/2018, 9:04 AM

## 2018-01-29 ENCOUNTER — Encounter: Payer: Self-pay | Admitting: Gastroenterology

## 2018-01-30 ENCOUNTER — Telehealth: Payer: Self-pay | Admitting: Gastroenterology

## 2018-01-30 NOTE — Telephone Encounter (Signed)
Pt is calling to check on referral to surgeon for Hinial hernia repair please call pt

## 2018-01-30 NOTE — Telephone Encounter (Signed)
Contacted patient to let her I know I left a message with [facility] to see if they received the referral, and I faxed a new referral in case it was not received.  Provided her with the office phone number as well.  I will call back tomorrow to check on the status as she would like to get an appt before the year is up.  Thanks [employer]

## 2018-02-03 ENCOUNTER — Encounter: Payer: Self-pay | Admitting: Gastroenterology

## 2018-02-03 DIAGNOSIS — N1330 Unspecified hydronephrosis: Secondary | ICD-10-CM | POA: Diagnosis not present

## 2018-02-03 DIAGNOSIS — N200 Calculus of kidney: Secondary | ICD-10-CM | POA: Diagnosis not present

## 2018-02-03 DIAGNOSIS — Z6823 Body mass index (BMI) 23.0-23.9, adult: Secondary | ICD-10-CM | POA: Diagnosis not present

## 2018-02-06 ENCOUNTER — Other Ambulatory Visit: Payer: Self-pay | Admitting: Family Medicine

## 2018-02-06 DIAGNOSIS — I700 Atherosclerosis of aorta: Secondary | ICD-10-CM

## 2018-02-06 DIAGNOSIS — R109 Unspecified abdominal pain: Secondary | ICD-10-CM | POA: Diagnosis not present

## 2018-02-06 NOTE — Telephone Encounter (Signed)
Refill Request for Cholesterol medication. Atorvastatin 10 mg  Last physical: None indicated   Lab Results  Component Value Date   CHOL 123 09/11/2017   HDL 65 09/11/2017   LDLCALC 49 09/11/2017   TRIG 44 09/11/2017   CHOLHDL 1.9 09/11/2017    Follow up: 03/11/2018

## 2018-02-10 ENCOUNTER — Other Ambulatory Visit: Payer: Self-pay | Admitting: Surgery

## 2018-02-10 DIAGNOSIS — R1084 Generalized abdominal pain: Secondary | ICD-10-CM

## 2018-02-11 ENCOUNTER — Telehealth: Payer: Self-pay

## 2018-02-11 NOTE — Telephone Encounter (Signed)
-----   Message from Virgel Manifold, MD sent at 02/04/2018 11:52 AM EST ----- [name] please let patient know, the biopsies from her upper endoscopy were benign.  However, she should be referred to surgery as her fundoplication wrap was not intact.  Please refer to [facility].    Her repeat colonoscopy should be in 5 years for screening.  Please set recall.

## 2018-02-11 NOTE — Telephone Encounter (Signed)
Pt notified of results. Has been referred to [facility] and seen. The doctor Johnathan Hausen, MD) there wants pt to have an UGI which is scheduled for 02/18/18 at [facility].

## 2018-02-13 ENCOUNTER — Encounter: Payer: Self-pay | Admitting: Family Medicine

## 2018-02-18 ENCOUNTER — Ambulatory Visit
Admission: RE | Admit: 2018-02-18 | Discharge: 2018-02-18 | Disposition: A | Discharge status: Home / Self Care | Payer: BLUE CROSS/BLUE SHIELD | Source: Ambulatory Visit | Attending: Surgery | Admitting: Surgery

## 2018-02-18 DIAGNOSIS — K449 Diaphragmatic hernia without obstruction or gangrene: Secondary | ICD-10-CM | POA: Diagnosis not present

## 2018-02-18 DIAGNOSIS — K219 Gastro-esophageal reflux disease without esophagitis: Secondary | ICD-10-CM | POA: Diagnosis not present

## 2018-02-18 DIAGNOSIS — R1084 Generalized abdominal pain: Secondary | ICD-10-CM

## 2018-02-19 DIAGNOSIS — F429 Obsessive-compulsive disorder, unspecified: Secondary | ICD-10-CM | POA: Diagnosis not present

## 2018-02-19 DIAGNOSIS — F411 Generalized anxiety disorder: Secondary | ICD-10-CM | POA: Diagnosis not present

## 2018-02-19 DIAGNOSIS — F349 Persistent mood [affective] disorder, unspecified: Secondary | ICD-10-CM | POA: Diagnosis not present

## 2018-02-19 DIAGNOSIS — F5105 Insomnia due to other mental disorder: Secondary | ICD-10-CM | POA: Diagnosis not present

## 2018-03-10 ENCOUNTER — Other Ambulatory Visit: Payer: Self-pay | Admitting: Family Medicine

## 2018-03-10 DIAGNOSIS — E119 Type 2 diabetes mellitus without complications: Secondary | ICD-10-CM

## 2018-03-10 NOTE — Telephone Encounter (Signed)
Refill request for general medication. Ozempic to [facility]  Last office visit 12/02/2017   Follow up on 03/11/2018

## 2018-03-11 ENCOUNTER — Encounter: Payer: Self-pay | Admitting: Family Medicine

## 2018-03-11 ENCOUNTER — Ambulatory Visit: Payer: BLUE CROSS/BLUE SHIELD | Admitting: Family Medicine

## 2018-03-11 VITALS — BP 110/70 | HR 80 | Temp 98.3°F | Resp 16 | Ht 61.0 in | Wt 134.4 lb

## 2018-03-11 DIAGNOSIS — G43109 Migraine with aura, not intractable, without status migrainosus: Secondary | ICD-10-CM | POA: Diagnosis not present

## 2018-03-11 DIAGNOSIS — E119 Type 2 diabetes mellitus without complications: Secondary | ICD-10-CM | POA: Diagnosis not present

## 2018-03-11 DIAGNOSIS — M461 Sacroiliitis, not elsewhere classified: Secondary | ICD-10-CM

## 2018-03-11 DIAGNOSIS — F5104 Psychophysiologic insomnia: Secondary | ICD-10-CM

## 2018-03-11 DIAGNOSIS — K219 Gastro-esophageal reflux disease without esophagitis: Secondary | ICD-10-CM

## 2018-03-11 DIAGNOSIS — E11628 Type 2 diabetes mellitus with other skin complications: Secondary | ICD-10-CM

## 2018-03-11 DIAGNOSIS — R0602 Shortness of breath: Secondary | ICD-10-CM

## 2018-03-11 DIAGNOSIS — K449 Diaphragmatic hernia without obstruction or gangrene: Secondary | ICD-10-CM

## 2018-03-11 DIAGNOSIS — J4530 Mild persistent asthma, uncomplicated: Secondary | ICD-10-CM | POA: Diagnosis not present

## 2018-03-11 DIAGNOSIS — F325 Major depressive disorder, single episode, in full remission: Secondary | ICD-10-CM

## 2018-03-11 DIAGNOSIS — E538 Deficiency of other specified B group vitamins: Secondary | ICD-10-CM

## 2018-03-11 DIAGNOSIS — L989 Disorder of the skin and subcutaneous tissue, unspecified: Secondary | ICD-10-CM

## 2018-03-11 DIAGNOSIS — I700 Atherosclerosis of aorta: Secondary | ICD-10-CM

## 2018-03-11 LAB — POCT GLYCOSYLATED HEMOGLOBIN (HGB A1C): Hemoglobin A1C: 4.9 % (ref 4.0–5.6)

## 2018-03-11 MED ORDER — ESZOPICLONE 2 MG PO TABS: 2.0000 mg | ORAL_TABLET | Freq: Every evening | ORAL | 0 refills | Status: DC | PRN

## 2018-03-11 MED ORDER — PANTOPRAZOLE SODIUM 40 MG PO TBEC: 40.0000 mg | DELAYED_RELEASE_TABLET | Freq: Every day | ORAL | 0 refills | Status: DC

## 2018-03-11 MED ORDER — MONTELUKAST SODIUM 10 MG PO TABS: ORAL_TABLET | ORAL | 1 refills | Status: DC

## 2018-03-11 MED ORDER — PROMETHAZINE HCL 12.5 MG PO TABS: ORAL_TABLET | ORAL | 0 refills | Status: DC

## 2018-03-11 NOTE — Progress Notes (Signed)
Name: Claire [name]   MRN: [id number]    DOB: 04-03-1970   Date:03/11/2018       Progress Note  Subjective  Chief Complaint  Chief Complaint  Patient presents with  . Diabetes  . B12 Injection    HPI   Major Depression: she is now seeing Dr. Nicolasa Ducking who is also giving her therapy. On zoloft and now taking Lunesta for sleep. She had side effects with Ambien ( interrupted sleep and sleep walking) , Trazodone ( sweating)  or Temazepam ( groggy ) .  Marland Kitchen  Asthma MildIntermittentt:she is doing well on singulair, uses albuterol prn only and has been doing well, symptoms are worse during the Spring.She denies coughing or wheezing at this time. Unchanged   Migraine: she is still seeing Dr. Domingo Cocking - now off Topamax and is on Zonegran, and is doing well at this time, no pain at this time. Migraines episodes very seldom since depression under better control.   DMII: diagnosed at age 64, took medication for a while, but stopped all medications 10/25/2010 after bariatric surgery,she has not been checking glucose at home,no polyphagia, polyuria or polydipsia. Maximum weight of 265 lbs and was down to 118.3lbs - lowest weight back in 08/2017), however weight is stable in the 130 lbs, still on Ozempic to control appetite   GERD and hiatal hernia: history of bariatric surgery, having multiple episodes of severe epigastric pain, caused her to have syncopal episodes secondary to intense pain. Seen by GI and also general surgeon ( Dr. Hassell Done ) that will do an exploratory surgery .She is now on Pantoprazole. Still having pain and some nausea and needs refill of medications   Back pain: seen by [facility], and is seeing Dr. Dorcas Mcmurray, she had NCS/EMG by Dr. Domingo Cocking and it was normal, Dr. Dorcas Mcmurray did NCS/EMG on upper extremity and she has carpal tunnel bilaterally. No bowel or bladder incontinence. She is off Lyrica , pain today is zero.  Currently seeing urologist and will have surgery to remove kidney  stones but still has pain, just not as severe  Skin lesion: going on for two months on right buttocks, using topical creams without much improvement we will refer to dermatologist   Atherosclerosis of aorta and hyperlipidemia: on statin therapy, no side effects, no chest pain or palpitation     Patient Active Problem List   Diagnosis Date Noted  . Columnar epithelial-lined lower esophagus   . Postprocedural disorder of digestive system   . Pain, abdominal, epigastric   . Esophageal dysphagia   . Generalized abdominal pain   . Persistent mood (affective) disorder, unspecified ([place]) 01/15/2018  . Insomnia related to another mental disorder 01/15/2018  . OCD (obsessive compulsive disorder) 10/29/2017  . GAD (generalized anxiety disorder) 10/29/2017  . Nephrolithiasis 10/22/2017  . Atherosclerosis of aorta ([place]) 12/21/2016  . Sacroiliac inflammation ([place]) 11/19/2016  . Hiatal hernia 08/17/2016  . Diverticulosis of colon 08/17/2016  . Iron deficiency anemia 08/08/2015  . Lumbosacral pain 01/07/2015  . Muscle twitching 01/07/2015  . Intertrigo 11/05/2014  . Perennial allergic rhinitis 09/03/2014  . Lung nodules 09/03/2014  . Vitamin D deficiency 09/03/2014  . Diet-controlled diabetes mellitus ([place]) 09/03/2014  . History of kidney stones 09/03/2014  . Chronic insomnia 09/03/2014  . History of iron deficiency anemia 09/03/2014  . Depression with anxiety 09/03/2014  . Palpitations 06/22/2014  . Bariatric surgery status 12/03/2013  . Asthma, mild persistent 11/21/2012  . CN (constipation) 11/21/2012  . GERD without esophagitis 11/21/2012  .  History of hyperlipidemia 11/21/2012  . Migraine with aura and without status migrainosus 11/21/2012  . History of sleep apnea 11/21/2012  . IBS (irritable bowel syndrome) 08/15/2012    Past Surgical History:  Procedure Laterality Date  . BLADDER SURGERY  2010  . BREAST BIOPSY Right 2014   stereotatic biopsy  . CHOLECYSTECTOMY  2010  .  COLONOSCOPY  2014    Done at [facility]  . COLONOSCOPY WITH PROPOFOL N/A 01/28/2018   Procedure: COLONOSCOPY WITH PROPOFOL;  Surgeon: Virgel Manifold, MD;  Location: [facility];  Service: Endoscopy;  Laterality: N/A;  . DILITATION & CURRETTAGE/HYSTROSCOPY WITH NOVASURE ABLATION N/A 06/16/2015   Procedure: DILATATION & CURETTAGE/HYSTEROSCOPY WITH NOVASURE ABLATION;  Surgeon: Brien Few, MD;  Location: [facility];  Service: Gynecology;  Laterality: N/A;  . ESOPHAGOGASTRODUODENOSCOPY (EGD) WITH PROPOFOL N/A 01/28/2018   Procedure: ESOPHAGOGASTRODUODENOSCOPY (EGD) WITH BIOPSIES;  Surgeon: Virgel Manifold, MD;  Location: [facility];  Service: Endoscopy;  Laterality: N/A;  . [place] RESECTION  2012  . LAPAROSCOPY N/A 06/16/2015   Procedure: LAPAROSCOPY DIAGNOSTIC;  Surgeon: Brien Few, MD;  Location: [facility];  Service: Gynecology;  Laterality: N/A;  . LYSIS OF ADHESION N/A 06/16/2015   Procedure: LYSIS OF ADHESION;  Surgeon: Brien Few, MD;  Location: [facility];  Service: Gynecology;  Laterality: N/A;  . PLANTAR FASCIA SURGERY Bilateral   . ROBOTIC ASSISTED SALPINGO OOPHERECTOMY Right 06/16/2015   Procedure: ROBOTIC ASSISTED SALPINGO OOPHORECTOMY, EXCISION OF RIGHT CUL DE [place] MASS;  Surgeon: Brien Few, MD;  Location: [facility];  Service: Gynecology;  Laterality: Right;  . TONSILLECTOMY    . UPPER GI ENDOSCOPY      Family History  Problem Relation Age of Onset  . Heart attack Mother   . Stroke Mother   . Multiple myeloma Mother   . Hyperlipidemia Father   . Heart attack Sister   . Heart attack Maternal Uncle   . Heart attack Paternal Grandfather   . Breast cancer Paternal Aunt   . Breast cancer Paternal Grandmother   . Lung cancer Maternal Grandfather     Social History   Socioeconomic History  . Marital status: Married    Spouse name: Not on file  . Number of children: 3  . Years of education: Not on file  . Highest education level:  Some college, no degree  Occupational History  . Not on file  Social Needs  . Financial resource strain: Not very hard  . Food insecurity:    Worry: Never true    Inability: Never true  . Transportation needs:    Medical: No    Non-medical: No  Tobacco Use  . Smoking status: Former Smoker    Packs/day: 1.00    Years: 10.00    Pack years: 10.00    Types: Cigarettes    Last attempt to quit: 02/19/1993    Years since quitting: 25.0  . Smokeless tobacco: Never Used  Substance and Sexual Activity  . Alcohol use: Yes    Alcohol/week: 0.0 standard drinks    Comment: occasionally  . Drug use: No  . Sexual activity: Yes    Partners: Male    Comment: last sex 04 Jan 2018  Lifestyle  . Physical activity:    Days per week: 2 days    Minutes per session: 30 min  . Stress: Very much  Relationships  . Social connections:    Talks on phone: Not on file    Gets together: Not on file  Attends religious service: Not on file    Active member of club or organization: Not on file    Attends meetings of clubs or organizations: Not on file    Relationship status: Not on file  . Intimate partner violence:    Fear of current or ex partner: No    Emotionally abused: No    Physically abused: No    Forced sexual activity: No  Other Topics Concern  . Not on file  Social History Narrative  . Not on file     Current Outpatient Medications:  .  albuterol (PROVENTIL HFA;VENTOLIN HFA) 108 (90 Base) MCG/ACT inhaler, TAKE 2 PUFFS EVERY 6 HOURS AS NEEDED FOR WHEEZING OR SHORTNESS OF BREATH. (VENTOLIN NOT COVERED), Disp: 8.5 Inhaler, Rfl: 2 .  Ascorbic Acid (VITAMIN C) 1000 MG tablet, Take 1,000 mg by mouth., Disp: , Rfl:  .  atorvastatin (LIPITOR) 10 MG tablet, TAKE 1 TABLET BY MOUTH EVERY DAY, Disp: 90 tablet, Rfl: 3 .  calcium carbonate (OS-CAL) 600 MG TABS tablet, Take 600 mg by mouth daily with breakfast., Disp: , Rfl:  .  clobetasol ointment (TEMOVATE) 0.05 %, , Disp: , Rfl: 0 .   cyclobenzaprine (FLEXERIL) 10 MG tablet, Take 1 tablet (10 mg total) by mouth at bedtime. Prn, Disp: 90 tablet, Rfl: 1 .  ferrous sulfate 325 (65 FE) MG EC tablet, Take 325 mg by mouth daily with breakfast. , Disp: , Rfl:  .  gentamicin ointment (GARAMYCIN) 0.1 %, APP IN NOSE TID PRF NASAL DRYNESS UTD, Disp: , Rfl: 0 .  loratadine (CLARITIN) 10 MG tablet, Take 10 mg by mouth daily., Disp: , Rfl:  .  mometasone (ELOCON) 0.1 % lotion, APP TO ITCHY EARS BID FOR 12 DAYS. MAY REPEAT PRF ITCHY EARS, Disp: , Rfl: 4 .  mometasone (NASONEX) 50 MCG/ACT nasal spray, USE 2 SPRAYS IN EACH NOSTRIL AT BEDTIME, Disp: 48 g, Rfl: 1 .  montelukast (SINGULAIR) 10 MG tablet, TAKE 1 TABLET(10 MG) BY MOUTH AT BEDTIME, Disp: 90 tablet, Rfl: 1 .  pantoprazole (PROTONIX) 40 MG tablet, Take 1 tablet (40 mg total) by mouth daily., Disp: 90 tablet, Rfl: 0 .  Potassium 99 MG TABS, Take by mouth., Disp: , Rfl:  .  promethazine (PHENERGAN) 12.5 MG tablet, TAKE 1 TABLET BY MOUTH EVERY 6 HOURS AS NEEDED FOR MIGRAINES WITH NAUSEA, Disp: 20 tablet, Rfl: 0 .  Semaglutide (OZEMPIC) 1 MG/DOSE SOPN, Inject 1 mg into the skin once a week., Disp: 9 mL, Rfl: 1 .  sertraline (ZOLOFT) 100 MG tablet, Take 1 tablet by mouth daily., Disp: , Rfl: 0 .  SUMAtriptan (IMITREX) 100 MG tablet, No more than 2 in 24 hours, Disp: 9 tablet, Rfl: 1 .  vitamin B-12 (CYANOCOBALAMIN) 100 MCG tablet, Take 100 mcg by mouth as directed. , Disp: , Rfl:  .  Vitamin D, Ergocalciferol, (DRISDOL) 50000 units CAPS capsule, Take 1 capsule (50,000 Units total) by mouth every 7 (seven) days. TAKE 1 CAPSULE EVERY 7 DAYS, Disp: 12 capsule, Rfl: 1 .  zonisamide (ZONEGRAN) 50 MG capsule, Take 3 capsules by mouth daily., Disp: , Rfl: 2 .  eszopiclone (LUNESTA) 2 MG TABS tablet, Take 1 tablet (2 mg total) by mouth at bedtime as needed for sleep. Take immediately before bedtime, Disp: 30 tablet, Rfl: 0  Allergies  Allergen Reactions  . Gabapentin     Bladder incontinence at  higher dose  . Linzess [Linaclotide]     Worsening of diarrhea  . Other Hives  and Other (See Comments)    Ricotta Cheese: flushed and hot  . Adhesive [Tape] Rash    Some clear tapes  . Cymbalta [Duloxetine Hcl] Palpitations    And photosensitivity  . Tapentadol Rash    Some clear tapes  . Vicodin [Hydrocodone-Acetaminophen] Itching    I personally reviewed active problem list, medication list, allergies, family history, social history with the patient/caregiver today.   ROS  Constitutional: Negative for fever or weight change.  Respiratory: Negative for cough and shortness of breath.   Cardiovascular: Negative for chest pain or palpitations.  Gastrointestinal: Positive  For intermittent  abdominal pain, no bowel changes.  Musculoskeletal: Negative for gait problem or joint swelling.  Skin: Negative for rash.  Neurological: Negative for dizziness, positive for intermittent headache.  No other specific complaints in a complete review of systems (except as listed in HPI above).  Objective  Vitals:   03/11/18 1510  BP: 110/70  Pulse: 80  Resp: 16  Temp: 98.3 F (36.8 C)  TempSrc: Oral  SpO2: 99%  Weight: 134 lb 6.4 oz (61 kg)  Height: 5' 1" (1.549 m)    Body mass index is 25.39 kg/m.  Physical Exam  Constitutional: Patient appears well-developed and well-nourished. Overweight.  No distress.  HEENT: head atraumatic, normocephalic, pupils equal and reactive to light,  neck supple, throat within normal limits Cardiovascular: Normal rate, regular rhythm and normal heart sounds.  No murmur heard. No BLE edema. Pulmonary/Chest: Effort normal and breath sounds normal. No respiratory distress. Abdominal: Soft.  There is no tenderness.  Psychiatric: Patient has a normal mood and affect. behavior is normal. Judgment and thought content normal.  Recent Results (from the past 2160 hour(s))  Vitamin B12     Status: Abnormal   Collection Time: 01/03/18  8:06 AM  Result Value  Ref Range   Vitamin B-12 181 (L) 232 - 1,245 pg/mL  CBC with Differential/Platelet     Status: Abnormal   Collection Time: 01/03/18  8:06 AM  Result Value Ref Range   WBC 5.3 3.4 - 10.8 x10E3/uL   RBC 4.38 3.77 - 5.28 x10E6/uL   Hemoglobin 12.8 11.1 - 15.9 g/dL   Hematocrit 40.2 34.0 - 46.6 %   MCV 92 79 - 97 fL   MCH 29.2 26.6 - 33.0 pg   MCHC 31.8 31.5 - 35.7 g/dL   RDW 12.2 (L) 12.3 - 15.4 %   Platelets 297 150 - 450 x10E3/uL   Neutrophils 59 Not Estab. %   Lymphs 25 Not Estab. %   Monocytes 8 Not Estab. %   Eos 7 Not Estab. %   Basos 1 Not Estab. %   Neutrophils Absolute 3.2 1.4 - 7.0 x10E3/uL   Lymphocytes Absolute 1.3 0.7 - 3.1 x10E3/uL   Monocytes Absolute 0.4 0.1 - 0.9 x10E3/uL   EOS (ABSOLUTE) 0.4 0.0 - 0.4 x10E3/uL   Basophils Absolute 0.0 0.0 - 0.2 x10E3/uL   Immature Granulocytes 0 Not Estab. %   Immature Grans (Abs) 0.0 0.0 - 0.1 x10E3/uL  Lipase, blood     Status: None   Collection Time: 01/06/18  1:06 PM  Result Value Ref Range   Lipase 25 11 - 51 U/L    Comment: Performed at [facility], [address] [place], [place] [address]  Comprehensive metabolic panel     Status: Abnormal   Collection Time: 01/06/18  1:06 PM  Result Value Ref Range   Sodium 140 135 - 145 mmol/L   Potassium 3.9  3.5 - 5.1 mmol/L   Chloride 106 98 - 111 mmol/L   CO2 25 22 - 32 mmol/L   Glucose, Bld 125 (H) 70 - 99 mg/dL   BUN 8 6 - 20 mg/dL   Creatinine, Ser 0.62 0.44 - 1.00 mg/dL   Calcium 8.8 (L) 8.9 - 10.3 mg/dL   Total Protein 6.5 6.5 - 8.1 g/dL   Albumin 3.8 3.5 - 5.0 g/dL   AST 17 15 - 41 U/L   ALT 21 0 - 44 U/L   Alkaline Phosphatase 61 38 - 126 U/L   Total Bilirubin 0.7 0.3 - 1.2 mg/dL   GFR calc non Af Amer >60 >60 mL/min   GFR calc Af Amer >60 >60 mL/min    Comment: (NOTE) The eGFR has been calculated using the CKD EPI equation. This calculation has not been validated in all clinical situations. eGFR's persistently <60 mL/min signify possible Chronic  Kidney Disease.    Anion gap 9 5 - 15    Comment: Performed at [facility], [address] [place], [place] [address]  CBC     Status: None   Collection Time: 01/06/18  1:06 PM  Result Value Ref Range   WBC 6.5 4.0 - 10.5 K/uL   RBC 4.15 3.87 - 5.11 MIL/uL   Hemoglobin 12.6 12.0 - 15.0 g/dL   HCT 39.5 36.0 - 46.0 %   MCV 95.2 80.0 - 100.0 fL   MCH 30.4 26.0 - 34.0 pg   MCHC 31.9 30.0 - 36.0 g/dL   RDW 13.5 11.5 - 15.5 %   Platelets 262 150 - 400 K/uL   nRBC 0.0 0.0 - 0.2 %    Comment: Performed at [facility], [address] [place], [place] [address]  Urinalysis, Complete w Microscopic     Status: Abnormal   Collection Time: 01/06/18  1:06 PM  Result Value Ref Range   Color, Urine COLORLESS (A) YELLOW   APPearance CLEAR (A) CLEAR   Specific Gravity, Urine 1.002 (L) 1.005 - 1.030   pH 6.0 5.0 - 8.0   Glucose, UA NEGATIVE NEGATIVE mg/dL   Hgb urine dipstick LARGE (A) NEGATIVE   Bilirubin Urine NEGATIVE NEGATIVE   Ketones, ur NEGATIVE NEGATIVE mg/dL   Protein, ur NEGATIVE NEGATIVE mg/dL   Nitrite NEGATIVE NEGATIVE   Leukocytes, UA NEGATIVE NEGATIVE   RBC / HPF 0-5 0 - 5 RBC/hpf   WBC, UA NONE SEEN 0 - 5 WBC/hpf   Bacteria, UA NONE SEEN NONE SEEN   Squamous Epithelial / LPF NONE SEEN 0 - 5   Mucus PRESENT     Comment: Performed at [facility], [address] [place], [place] [address]  Pregnancy, urine POC     Status: None   Collection Time: 01/06/18  1:43 PM  Result Value Ref Range   Preg Test, Ur NEGATIVE NEGATIVE    Comment:        THE SENSITIVITY OF THIS METHODOLOGY IS >24 mIU/mL   Urinalysis, Routine w reflex microscopic     Status: Abnormal   Collection Time: 01/06/18 10:18 PM  Result Value Ref Range   Color, Urine YELLOW YELLOW   APPearance CLEAR CLEAR   Specific Gravity, Urine >1.046 (H) 1.005 - 1.030   pH 6.0 5.0 - 8.0   Glucose, UA NEGATIVE NEGATIVE mg/dL   Hgb urine dipstick LARGE (A) NEGATIVE   Bilirubin Urine  NEGATIVE NEGATIVE   Ketones, ur NEGATIVE NEGATIVE mg/dL   Protein, ur NEGATIVE NEGATIVE  mg/dL   Nitrite NEGATIVE NEGATIVE   Leukocytes, UA NEGATIVE NEGATIVE   RBC / HPF 6-10 0 - 5 RBC/hpf   WBC, UA 0-5 0 - 5 WBC/hpf   Bacteria, UA RARE (A) NONE SEEN   Squamous Epithelial / LPF 0-5 0 - 5   Mucus PRESENT     Comment: Performed at [facility], [address], [place], [place] [address]  Lipase, blood     Status: None   Collection Time: 01/09/18  1:17 PM  Result Value Ref Range   Lipase 25 11 - 51 U/L    Comment: Performed at [facility], [address] [place], [place] [address]  Comprehensive metabolic panel     Status: Abnormal   Collection Time: 01/09/18  1:17 PM  Result Value Ref Range   Sodium 139 135 - 145 mmol/L   Potassium 3.8 3.5 - 5.1 mmol/L   Chloride 106 98 - 111 mmol/L   CO2 26 22 - 32 mmol/L   Glucose, Bld 98 70 - 99 mg/dL   BUN 7 6 - 20 mg/dL   Creatinine, Ser 0.52 0.44 - 1.00 mg/dL   Calcium 8.7 (L) 8.9 - 10.3 mg/dL   Total Protein 6.3 (L) 6.5 - 8.1 g/dL   Albumin 3.6 3.5 - 5.0 g/dL   AST 25 15 - 41 U/L   ALT 37 0 - 44 U/L   Alkaline Phosphatase 74 38 - 126 U/L   Total Bilirubin 0.7 0.3 - 1.2 mg/dL   GFR calc non Af Amer >60 >60 mL/min   GFR calc Af Amer >60 >60 mL/min    Comment: (NOTE) The eGFR has been calculated using the CKD EPI equation. This calculation has not been validated in all clinical situations. eGFR's persistently <60 mL/min signify possible Chronic Kidney Disease.    Anion gap 7 5 - 15    Comment: Performed at [facility], [address] [place], [place] [address]  CBC     Status: None   Collection Time: 01/09/18  1:17 PM  Result Value Ref Range   WBC 6.5 4.0 - 10.5 K/uL   RBC 4.01 3.87 - 5.11 MIL/uL   Hemoglobin 12.2 12.0 - 15.0 g/dL   HCT 38.1 36.0 - 46.0 %   MCV 95.0 80.0 - 100.0 fL   MCH 30.4 26.0 - 34.0 pg   MCHC 32.0 30.0 - 36.0 g/dL   RDW 13.3 11.5 - 15.5 %   Platelets 248 150 - 400 K/uL   nRBC  0.0 0.0 - 0.2 %    Comment: Performed at [facility], [address] [place], [place] [address]  Urinalysis, Complete w Microscopic     Status: Abnormal   Collection Time: 01/09/18  1:17 PM  Result Value Ref Range   Color, Urine YELLOW (A) YELLOW   APPearance HAZY (A) CLEAR   Specific Gravity, Urine 1.011 1.005 - 1.030   pH 8.0 5.0 - 8.0   Glucose, UA NEGATIVE NEGATIVE mg/dL   Hgb urine dipstick NEGATIVE NEGATIVE   Bilirubin Urine NEGATIVE NEGATIVE   Ketones, ur NEGATIVE NEGATIVE mg/dL   Protein, ur NEGATIVE NEGATIVE mg/dL   Nitrite NEGATIVE NEGATIVE   Leukocytes, UA NEGATIVE NEGATIVE   RBC / HPF 0-5 0 - 5 RBC/hpf   WBC, UA 0-5 0 - 5 WBC/hpf   Bacteria, UA NONE SEEN NONE SEEN   Squamous Epithelial / LPF 0-5 0 - 5   Mucus PRESENT     Comment: Performed at [employer]  [facility], [address] [place], [place] [address]  Pregnancy, urine     Status: None   Collection Time: 01/09/18  1:17 PM  Result Value Ref Range   Preg Test, Ur NEGATIVE NEGATIVE    Comment: Performed at [facility], [address] [place], [place] [address]     PHQ2/9: Depression screen [facility] 2/9 03/11/2018 12/02/2017 09/04/2017 05/22/2017 02/20/2017  Decreased Interest 0 0 0 0 0  Down, Depressed, Hopeless 0 0 0 0 0  PHQ - 2 Score 0 0 0 0 0  Altered sleeping 0 0 0 0 -  Tired, decreased energy 1 0 0 3 -  Change in appetite 1 0 0 0 -  Feeling bad or failure about yourself  0 0 0 0 -  Trouble concentrating 0 0 0 3 -  Moving slowly or fidgety/restless 0 0 0 0 -  Suicidal thoughts 0 0 0 0 -  PHQ-9 Score 2 0 0 6 -  Difficult doing work/chores Not difficult at all Not difficult at all Not difficult at all Not difficult at all -     Fall Risk: Fall Risk  03/11/2018 12/02/2017 09/04/2017 05/22/2017 02/20/2017  Falls in the past year? 0 No No No No  Number falls in past yr: 0 - - - -  Injury with Fall? 0 - - - -     Assessment & Plan  1. Diet-controlled diabetes mellitus ([place])  - POCT HgB  A1C  2. Migraine with aura and without status migrainosus, not intractable  - promethazine (PHENERGAN) 12.5 MG tablet; TAKE 1 TABLET BY MOUTH EVERY 6 HOURS AS NEEDED FOR MIGRAINES WITH NAUSEA  Dispense: 20 tablet; Refill: 0  3. Mild persistent asthma without complication  - montelukast (SINGULAIR) 10 MG tablet; TAKE 1 TABLET(10 MG) BY MOUTH AT BEDTIME  Dispense: 90 tablet; Refill: 1  4. Sacroiliac inflammation ([place])  She still has daily pain. Seen by chiropractor  5. Major depression in remission [facility])  Under the  care of Dr. Nicolasa Ducking   6. Atherosclerosis of aorta ([place])  On statin therapy   7. Chronic insomnia  Doing well on Lunesta  8. GERD without esophagitis  - pantoprazole (PROTONIX) 40 MG tablet; Take 1 tablet (40 mg total) by mouth daily.  Dispense: 90 tablet; Refill: 0  9. Esophageal hiatal hernia  - pantoprazole (PROTONIX) 40 MG tablet; Take 1 tablet (40 mg total) by mouth daily.  Dispense: 90 tablet; Refill: 0  10. Lesion of skin due to diabetes mellitus [facility])  - Ambulatory referral to Dermatology

## 2018-03-13 ENCOUNTER — Encounter (HOSPITAL_COMMUNITY): Payer: Self-pay | Admitting: *Deleted

## 2018-03-18 DIAGNOSIS — G518 Other disorders of facial nerve: Secondary | ICD-10-CM | POA: Diagnosis not present

## 2018-03-18 DIAGNOSIS — M542 Cervicalgia: Secondary | ICD-10-CM | POA: Diagnosis not present

## 2018-03-18 DIAGNOSIS — M791 Myalgia, unspecified site: Secondary | ICD-10-CM | POA: Diagnosis not present

## 2018-03-18 DIAGNOSIS — G43019 Migraine without aura, intractable, without status migrainosus: Secondary | ICD-10-CM | POA: Diagnosis not present

## 2018-03-18 DIAGNOSIS — G43839 Menstrual migraine, intractable, without status migrainosus: Secondary | ICD-10-CM | POA: Diagnosis not present

## 2018-03-21 NOTE — Patient Instructions (Addendum)
Claire [name]  03/21/2018   Your procedure is scheduled on:  Tuesday 04/01/2018  Report to [facility] Main  Entrance             Report to admitting at  0530  AM    Call this number if you have problems the morning of surgery [phone]    Remember: Do not eat food  :After Midnight.   NO SOLID FOOD AFTER MIDNIGHT THE NIGHT PRIOR TO SURGERY. NOTHING BY MOUTH EXCEPT CLEAR LIQUIDS UNTIL 3 HOURS PRIOR TO [place] SURGERY. PLEASE FINISH ENSURE DRINK PER SURGEON ORDER 3 HOURS PRIOR TO SCHEDULED SURGERY TIME WHICH NEEDS TO BE COMPLETED AT 0430 am.   CLEAR LIQUID DIET   Foods Allowed                                                                     Foods Excluded  Coffee and tea, regular and decaf                             liquids that you cannot  Plain Jell-O in any flavor                                             see through such as: Fruit ices (not with fruit pulp)                                     milk, soups, orange juice  Iced Popsicles                                    All solid food Carbonated beverages, regular and diet                                    Cranberry, grape and apple juices Sports drinks like Gatorade Lightly seasoned clear broth or consume(fat free) Sugar, honey syrup  Sample Menu Breakfast                                Lunch                                     Supper Cranberry juice                    Beef broth                            Chicken broth Jell-O  Grape juice                           Apple juice Coffee or tea                        Jell-O                                      Popsicle                                                Coffee or tea                        Coffee or tea  _____________________________________________________________________               BRUSH YOUR TEETH MORNING OF SURGERY AND RINSE YOUR MOUTH OUT, NO CHEWING GUM CANDY OR MINTS.     Take  these medicines the morning of surgery with A SIP OF WATER: Zonisamide (Zonegran), Sertraline (Zoloft), Atorvastatin (Lipitor), use Albuterol inhaler if needed and bring it with you to the hospital                                  You may not have any metal on your body including hair pins and              piercings  Do not wear jewelry, make-up, lotions, powders or perfumes, deodorant             Do not wear nail polish.  Do not shave  48 hours prior to surgery.                Do not bring valuables to the hospital. [place].  Contacts, dentures or bridgework may not be worn into surgery.  Leave suitcase in the car. After surgery it may be brought to your room.     Patients discharged the day of surgery will not be allowed to drive home. IF YOU ARE HAVING SURGERY AND GOING HOME THE SAME DAY, YOU MUST HAVE AN ADULT TO DRIVE YOU HOME AND BE WITH YOU FOR 24 HOURS. YOU MAY GO HOME BY TAXI OR UBER OR ORTHERWISE, BUT AN ADULT MUST ACCOMPANY YOU HOME AND STAY WITH YOU FOR 24 HOURS.  Name and phone number of your driver:spouse-Nick                Please read over the following fact sheets you were given: _____________________________________________________________________  [facility] - Preparing for Surgery Before surgery, you can play an important role.  Because skin is not sterile, your skin needs to be as free of germs as possible.  You can reduce the number of germs on your skin by washing with CHG (chlorahexidine gluconate) soap before surgery.  CHG is an antiseptic cleaner which kills germs and bonds with the skin to continue killing germs even after washing. Please DO NOT use if you have an allergy to CHG or antibacterial soaps.  If your skin  becomes reddened/irritated stop using the CHG and inform your nurse when you arrive at Short Stay. Do not shave (including legs and underarms) for at least 48 hours prior to the first CHG shower.  You  may shave your face/neck. Please follow these instructions carefully:  1.  Shower with CHG Soap the night before surgery and the  morning of Surgery.  2.  If you choose to wash your hair, wash your hair first as usual with your  normal  shampoo.  3.  After you shampoo, rinse your hair and body thoroughly to remove the  shampoo.                           4.  Use CHG as you would any other liquid soap.  You can apply chg directly  to the skin and wash                       Gently with a scrungie or clean washcloth.  5.  Apply the CHG Soap to your body ONLY FROM THE NECK DOWN.   Do not use on face/ open                           Wound or open sores. Avoid contact with eyes, ears mouth and genitals (private parts).                       Wash face,  Genitals (private parts) with your normal soap.             6.  Wash thoroughly, paying special attention to the area where your surgery  will be performed.  7.  Thoroughly rinse your body with warm water from the neck down.  8.  DO NOT shower/wash with your normal soap after using and rinsing off  the CHG Soap.                9.  Pat yourself dry with a clean towel.            10.  Wear clean pajamas.            11.  Place clean sheets on your bed the night of your first shower and do not  sleep with pets. Day of Surgery : Do not apply any lotions/deodorants the morning of surgery.  Please wear clean clothes to the hospital/surgery center.  FAILURE TO FOLLOW THESE INSTRUCTIONS MAY RESULT IN THE CANCELLATION OF YOUR SURGERY PATIENT SIGNATURE_________________________________  NURSE SIGNATURE__________________________________  ________________________________________________________________________

## 2018-03-24 ENCOUNTER — Other Ambulatory Visit: Payer: Self-pay

## 2018-03-24 ENCOUNTER — Encounter (HOSPITAL_COMMUNITY): Payer: Self-pay

## 2018-03-24 ENCOUNTER — Encounter (HOSPITAL_COMMUNITY)
Admission: RE | Admit: 2018-03-24 | Discharge: 2018-03-24 | Disposition: A | Discharge status: Home / Self Care | Payer: BLUE CROSS/BLUE SHIELD | Source: Ambulatory Visit | Attending: Surgery | Admitting: Surgery

## 2018-03-24 DIAGNOSIS — R109 Unspecified abdominal pain: Secondary | ICD-10-CM | POA: Insufficient documentation

## 2018-03-24 DIAGNOSIS — Z01812 Encounter for preprocedural laboratory examination: Secondary | ICD-10-CM | POA: Diagnosis not present

## 2018-03-24 LAB — CBC
HCT: 39.3 % (ref 36.0–46.0)
Hemoglobin: 12.8 g/dL (ref 12.0–15.0)
MCH: 31.1 pg (ref 26.0–34.0)
MCHC: 32.6 g/dL (ref 30.0–36.0)
MCV: 95.6 fL (ref 80.0–100.0)
Platelets: 261 10*3/uL (ref 150–400)
RBC: 4.11 MIL/uL (ref 3.87–5.11)
RDW: 13.2 % (ref 11.5–15.5)
WBC: 7.2 10*3/uL (ref 4.0–10.5)
nRBC: 0 % (ref 0.0–0.2)

## 2018-03-24 LAB — BASIC METABOLIC PANEL
Anion gap: 6 (ref 5–15)
BUN: 12 mg/dL (ref 6–20)
CO2: 24 mmol/L (ref 22–32)
Calcium: 8.6 mg/dL — ABNORMAL LOW (ref 8.9–10.3)
Chloride: 108 mmol/L (ref 98–111)
Creatinine, Ser: 0.65 mg/dL (ref 0.44–1.00)
GFR calc Af Amer: >60 mL/min (ref >60)
GFR calc non Af Amer: >60 mL/min (ref >60)
Glucose, Bld: 93 mg/dL (ref 70–99)
Potassium: 3.6 mmol/L (ref 3.5–5.1)
Sodium: 138 mmol/L (ref 135–145)

## 2018-03-24 LAB — GLUCOSE, CAPILLARY: GLUCOSE-CAPILLARY: 115 mg/dL — AB (ref 70–99)

## 2018-03-24 LAB — HCG, SERUM, QUALITATIVE: Preg, Serum: NEGATIVE

## 2018-03-24 NOTE — Progress Notes (Signed)
03/11/2018- noted in [place]- HgA1C-4.7  01/09/2018- noted in Epic- EKG  01/06/2018-noted in Epic- CT abd. Pelvis w/contrast

## 2018-03-31 MED ORDER — BUPIVACAINE LIPOSOME 1.3 % IJ SUSP
20.0000 mL | Freq: Once | INTRAMUSCULAR | Status: DC
  Filled 2018-03-31: qty 20

## 2018-03-31 NOTE — H&P (Signed)
Chief Complaint:  Sharp abdominal pain with syncope  History of Present Illness:  Claire [name] is an 48 y.o. female who has been evaluated by me at the  office for abdominal pain and a bloody BM.  Sh had bee evaluated by Dr. Tahiliani who performed upper and lower endoscopy -colon negative,  Upper showed a small hiatal hernia in prior sleeve gastrectomy.  There is an area at her umbilicus that may be a urachal remanant or a small hernia that when palpated produced the pain that she had and led her to the ER.    Past Medical History:  Diagnosis Date  . Anemia   . Anxiety   . Arthritis    hands, hips  . Asthma   . Bilateral leg cramps   . Depression   . Diabetes mellitus without complication (HCC)    history no longer a problem since weight loss surgery  . DJD (degenerative joint disease)   . GERD (gastroesophageal reflux disease)   . Headache    Migraines  . IBS (irritable bowel syndrome)   . Kidney stone   . Low serum vitamin B12   . Lung nodules   . Migraine    down to approx 4x/mo since starting meds  . Muscle spasm   . Psoriasis    vaginal area  . Seasonal allergies   . Sleep apnea    history no longer a problem since weight loss surgery  . Vitamin D deficiency     Past Surgical History:  Procedure Laterality Date  . BLADDER SURGERY  2010  . BREAST BIOPSY Right 2014   stereotatic biopsy  . CHOLECYSTECTOMY  2010  . COLONOSCOPY  2014    Done at [place]  . COLONOSCOPY WITH PROPOFOL N/A 01/28/2018   Procedure: COLONOSCOPY WITH PROPOFOL;  Surgeon: Tahiliani, Varnita B, MD;  Location: [facility];  Service: Endoscopy;  Laterality: N/A;  . DILITATION & CURRETTAGE/HYSTROSCOPY WITH NOVASURE ABLATION N/A 06/16/2015   Procedure: DILATATION & CURETTAGE/HYSTEROSCOPY WITH NOVASURE ABLATION;  Surgeon: Richard Taavon, MD;  Location: [facility];  Service: Gynecology;  Laterality: N/A;  . ESOPHAGOGASTRODUODENOSCOPY (EGD) WITH PROPOFOL N/A 01/28/2018   Procedure:  ESOPHAGOGASTRODUODENOSCOPY (EGD) WITH BIOPSIES;  Surgeon: Tahiliani, Varnita B, MD;  Location: [facility];  Service: Endoscopy;  Laterality: N/A;  . LAPAROSCOPIC GASTRIC SLEEVE RESECTION  2012  . LAPAROSCOPY N/A 06/16/2015   Procedure: LAPAROSCOPY DIAGNOSTIC;  Surgeon: Richard Taavon, MD;  Location: [facility];  Service: Gynecology;  Laterality: N/A;  . LITHOTRIPSY  12/11/2017   laser lithotripsy  . LYSIS OF ADHESION N/A 06/16/2015   Procedure: LYSIS OF ADHESION;  Surgeon: Richard Taavon, MD;  Location: [facility];  Service: Gynecology;  Laterality: N/A;  . PLANTAR FASCIA SURGERY Bilateral   . ROBOTIC ASSISTED SALPINGO OOPHERECTOMY Right 06/16/2015   Procedure: ROBOTIC ASSISTED SALPINGO OOPHORECTOMY, EXCISION OF RIGHT CUL DE SAC MASS;  Surgeon: Richard Taavon, MD;  Location: [facility];  Service: Gynecology;  Laterality: Right;  . TONSILLECTOMY    . UPPER GI ENDOSCOPY      Current Facility-Administered Medications  Medication Dose Route Frequency Provider Last Rate Last Dose  . [START ON 04/01/2018] bupivacaine liposome (EXPAREL) 1.3 % injection 266 mg  20 mL Infiltration Once Martin, Matthew, MD       Current Outpatient Medications  Medication Sig Dispense Refill  . albuterol (PROVENTIL HFA;VENTOLIN HFA) 108 (90 Base) MCG/ACT inhaler TAKE 2 PUFFS EVERY 6 HOURS AS NEEDED FOR WHEEZING OR SHORTNESS OF BREATH. (VENTOLIN NOT COVERED) (Patient   taking differently: Inhale 2 puffs into the lungs every 6 (six) hours as needed for wheezing or shortness of breath. TAKE 2 PUFFS EVERY 6 HOURS AS NEEDED FOR WHEEZING OR SHORTNESS OF BREATH. (VENTOLIN NOT COVERED)) 8.5 Inhaler 2  . Ascorbic Acid (VITAMIN C) 1000 MG tablet Take 1,000 mg by mouth.    Marland Kitchen aspirin EC 81 MG tablet Take 81 mg by mouth daily.    Marland Kitchen atorvastatin (LIPITOR) 10 MG tablet TAKE 1 TABLET BY MOUTH EVERY DAY (Patient taking differently: Take 10 mg by mouth daily. ) 90 tablet 3  . calcium carbonate (OS-CAL) 600 MG TABS tablet Take 600 mg by mouth  daily with breakfast.    . clobetasol ointment (TEMOVATE) 2.13 % Apply 1 application topically daily as needed (psoriasis).   0  . Cyanocobalamin (VITAMIN B-12 PO) Take 2,500 mcg by mouth daily.     . cyclobenzaprine (FLEXERIL) 10 MG tablet Take 1 tablet (10 mg total) by mouth at bedtime. Prn (Patient taking differently: Take 10 mg by mouth at bedtime as needed (migraine). ) 90 tablet 1  . eszopiclone (LUNESTA) 2 MG TABS tablet Take 1 tablet (2 mg total) by mouth at bedtime as needed for sleep. Take immediately before bedtime (Patient taking differently: Take 2 mg by mouth at bedtime. Take immediately before bedtime) 30 tablet 0  . ferrous sulfate 325 (65 FE) MG EC tablet Take 325 mg by mouth daily with breakfast.     . gentamicin ointment (GARAMYCIN) 0.1 % Apply 1 application topically daily as needed (nasal dryness).   0  . Lactobacillus (ACIDOPHILUS PO) Take 1 tablet by mouth daily.    Marland Kitchen loratadine (CLARITIN) 10 MG tablet Take 10 mg by mouth daily.    . 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   4  . mometasone (NASONEX) 50 MCG/ACT nasal spray USE 2 SPRAYS IN EACH NOSTRIL AT BEDTIME (Patient taking differently: Place 2 sprays into the nose daily as needed (allergies). USE 2 SPRAYS IN EACH NOSTRIL AT BEDTIME) 48 g 1  . montelukast (SINGULAIR) 10 MG tablet TAKE 1 TABLET(10 MG) BY MOUTH AT BEDTIME (Patient taking differently: Take 10 mg by mouth at bedtime. TAKE 1 TABLET(10 MG) BY MOUTH AT BEDTIME) 90 tablet 1  . pantoprazole (PROTONIX) 40 MG tablet Take 1 tablet (40 mg total) by mouth daily. 90 tablet 0  . potassium citrate (UROCIT-K) 10 MEQ (1080 MG) SR tablet Take 10 mEq by mouth 2 (two) times daily.    . promethazine (PHENERGAN) 12.5 MG tablet TAKE 1 TABLET BY MOUTH EVERY 6 HOURS AS NEEDED FOR MIGRAINES WITH NAUSEA (Patient taking differently: Take 12.5 mg by mouth every 6 (six) hours as needed for nausea or vomiting. TAKE 1 TABLET BY  MOUTH EVERY 6 HOURS AS NEEDED FOR MIGRAINES WITH NAUSEA) 20 tablet 0  . Semaglutide (OZEMPIC) 1 MG/DOSE SOPN Inject 1 mg into the skin once a week. (Patient taking differently: Inject 1 mg into the skin once a week. Saturday) 9 mL 1  . sertraline (ZOLOFT) 100 MG tablet Take 100 mg by mouth daily.   0  . SUMAtriptan (IMITREX) 100 MG tablet No more than 2 in 24 hours (Patient taking differently: Take 100 mg by mouth every 2 (two) hours as needed for migraine. No more than 2 in 24 hours) 9 tablet 1  . Vitamin D, Ergocalciferol, (DRISDOL) 50000 units CAPS capsule Take 1 capsule (50,000 Units total) by mouth every 7 (  seven) days. TAKE 1 CAPSULE EVERY 7 DAYS 12 capsule 1  . zonisamide (ZONEGRAN) 50 MG capsule Take 150 capsules by mouth daily.   2   Gabapentin; Linzess [linaclotide]; Other; Adhesive [tape]; Cymbalta [duloxetine hcl]; Tapentadol; and Vicodin [hydrocodone-acetaminophen] Family History  Problem Relation Age of Onset  . Heart attack Mother   . Stroke Mother   . Multiple myeloma Mother   . Hyperlipidemia Father   . Heart attack Sister   . Heart attack Maternal Uncle   . Heart attack Paternal Grandfather   . Breast cancer Paternal Aunt   . Breast cancer Paternal Grandmother   . Lung cancer Maternal Grandfather    Social History:   reports that she quit smoking about 25 years ago. Her smoking use included cigarettes. She has a 10.00 pack-year smoking history. She has never used smokeless tobacco. She reports current alcohol use. She reports that she does not use drugs.   REVIEW OF SYSTEMS : See problem list  Physical Exam:   There were no vitals taken for this visit. There is no height or weight on file to calculate BMI.  BMi ~ 24  Gen:  WDWN WF NAD  Neurological: Alert and oriented to person, place, and time. Motor and sensory function is grossly intact  Head: Normocephalic and atraumatic.  Eyes: Conjunctivae are normal. Pupils are equal, round, and reactive to light. No scleral  icterus.  Neck: Normal range of motion. Neck supple. No tracheal deviation or thyromegaly present.  Cardiovascular:  SR without murmurs or gallops.  No carotid bruits Breast:  Not examined Respiratory: Effort normal.  No respiratory distress. No chest wall tenderness. Breath sounds normal.  No wheezes, rales or rhonchi.  Abdomen:  Tender area that to palpation reproduces the pain that she had when she passed out GU:  Not examined Musculoskeletal: Normal range of motion. Extremities are nontender. No cyanosis, edema or clubbing noted Lymphadenopathy: No cervical, preauricular, postauricular or axillary adenopathy is present Skin: Skin is warm and dry. No rash noted. No diaphoresis. No erythema. No pallor. Pscyh: Normal mood and affect. Behavior is normal. Judgment and thought content normal.   LABORATORY RESULTS: No results found for this or any previous visit (from the past 48 hour(s)).   RADIOLOGY RESULTS: No results found.  Problem List: Patient Active Problem List   Diagnosis Date Noted  . Columnar epithelial-lined lower esophagus   . Postprocedural disorder of digestive system   . Pain, abdominal, epigastric   . Esophageal dysphagia   . Generalized abdominal pain   . Persistent mood (affective) disorder, unspecified (HCC) 01/15/2018  . Insomnia related to another mental disorder 01/15/2018  . OCD (obsessive compulsive disorder) 10/29/2017  . GAD (generalized anxiety disorder) 10/29/2017  . Nephrolithiasis 10/22/2017  . Atherosclerosis of aorta (HCC) 12/21/2016  . Sacroiliac inflammation (HCC) 11/19/2016  . Hiatal hernia 08/17/2016  . Diverticulosis of colon 08/17/2016  . Iron deficiency anemia 08/08/2015  . Lumbosacral pain 01/07/2015  . Muscle twitching 01/07/2015  . Intertrigo 11/05/2014  . Perennial allergic rhinitis 09/03/2014  . Lung nodules 09/03/2014  . Vitamin D deficiency 09/03/2014  . Diet-controlled diabetes mellitus (HCC) 09/03/2014  . History of kidney  stones 09/03/2014  . Chronic insomnia 09/03/2014  . History of iron deficiency anemia 09/03/2014  . Depression with anxiety 09/03/2014  . Palpitations 06/22/2014  . Bariatric surgery status 12/03/2013  . Asthma, mild persistent 11/21/2012  . CN (constipation) 11/21/2012  . GERD without esophagitis 11/21/2012  . History of hyperlipidemia 11/21/2012  .   Migraine with aura and without status migrainosus 11/21/2012  . History of sleep apnea 11/21/2012  . IBS (irritable bowel syndrome) 08/15/2012    Assessment & Plan: Laparoscopy for suspected small hernia or band adhesion to an old trocar site from her lap sleeve in 2012 by Dr. Shawna Orleans in [place].  She also may have had a hiatal hernia repair with mesh.      Matt B. Hassell Done, MD, [facility] [phone] beeper [phone]  03/31/2018 10:19 PM

## 2018-04-01 ENCOUNTER — Ambulatory Visit (HOSPITAL_COMMUNITY): Payer: BLUE CROSS/BLUE SHIELD | Admitting: Physician Assistant

## 2018-04-01 ENCOUNTER — Ambulatory Visit (HOSPITAL_COMMUNITY)
Admission: RE | Admit: 2018-04-01 | Discharge: 2018-04-01 | Disposition: A | Discharge status: Home / Self Care | Payer: BLUE CROSS/BLUE SHIELD | Attending: Surgery | Admitting: Surgery

## 2018-04-01 ENCOUNTER — Encounter (HOSPITAL_COMMUNITY)
Admission: RE | Disposition: A | Discharge status: Home / Self Care | Payer: Self-pay | Source: Home / Self Care | Attending: Surgery

## 2018-04-01 ENCOUNTER — Other Ambulatory Visit: Payer: Self-pay

## 2018-04-01 ENCOUNTER — Encounter (HOSPITAL_COMMUNITY): Payer: Self-pay | Admitting: *Deleted

## 2018-04-01 ENCOUNTER — Ambulatory Visit (HOSPITAL_COMMUNITY): Payer: BLUE CROSS/BLUE SHIELD | Admitting: Anesthesiology

## 2018-04-01 DIAGNOSIS — Z87442 Personal history of urinary calculi: Secondary | ICD-10-CM | POA: Insufficient documentation

## 2018-04-01 DIAGNOSIS — F329 Major depressive disorder, single episode, unspecified: Secondary | ICD-10-CM | POA: Diagnosis not present

## 2018-04-01 DIAGNOSIS — I700 Atherosclerosis of aorta: Secondary | ICD-10-CM | POA: Insufficient documentation

## 2018-04-01 DIAGNOSIS — K449 Diaphragmatic hernia without obstruction or gangrene: Secondary | ICD-10-CM | POA: Diagnosis not present

## 2018-04-01 DIAGNOSIS — Z7951 Long term (current) use of inhaled steroids: Secondary | ICD-10-CM | POA: Diagnosis not present

## 2018-04-01 DIAGNOSIS — G4730 Sleep apnea, unspecified: Secondary | ICD-10-CM | POA: Insufficient documentation

## 2018-04-01 DIAGNOSIS — Z87891 Personal history of nicotine dependence: Secondary | ICD-10-CM | POA: Insufficient documentation

## 2018-04-01 DIAGNOSIS — K439 Ventral hernia without obstruction or gangrene: Secondary | ICD-10-CM | POA: Insufficient documentation

## 2018-04-01 DIAGNOSIS — E559 Vitamin D deficiency, unspecified: Secondary | ICD-10-CM | POA: Insufficient documentation

## 2018-04-01 DIAGNOSIS — K6389 Other specified diseases of intestine: Secondary | ICD-10-CM | POA: Diagnosis not present

## 2018-04-01 DIAGNOSIS — F429 Obsessive-compulsive disorder, unspecified: Secondary | ICD-10-CM | POA: Insufficient documentation

## 2018-04-01 DIAGNOSIS — K589 Irritable bowel syndrome without diarrhea: Secondary | ICD-10-CM | POA: Diagnosis not present

## 2018-04-01 DIAGNOSIS — K5900 Constipation, unspecified: Secondary | ICD-10-CM | POA: Insufficient documentation

## 2018-04-01 DIAGNOSIS — F411 Generalized anxiety disorder: Secondary | ICD-10-CM | POA: Insufficient documentation

## 2018-04-01 DIAGNOSIS — Z8249 Family history of ischemic heart disease and other diseases of the circulatory system: Secondary | ICD-10-CM | POA: Insufficient documentation

## 2018-04-01 DIAGNOSIS — R109 Unspecified abdominal pain: Secondary | ICD-10-CM | POA: Diagnosis not present

## 2018-04-01 DIAGNOSIS — G43909 Migraine, unspecified, not intractable, without status migrainosus: Secondary | ICD-10-CM | POA: Diagnosis not present

## 2018-04-01 DIAGNOSIS — Z803 Family history of malignant neoplasm of breast: Secondary | ICD-10-CM | POA: Insufficient documentation

## 2018-04-01 DIAGNOSIS — K660 Peritoneal adhesions (postprocedural) (postinfection): Secondary | ICD-10-CM | POA: Diagnosis not present

## 2018-04-01 DIAGNOSIS — J45909 Unspecified asthma, uncomplicated: Secondary | ICD-10-CM | POA: Diagnosis not present

## 2018-04-01 DIAGNOSIS — Z7982 Long term (current) use of aspirin: Secondary | ICD-10-CM | POA: Diagnosis not present

## 2018-04-01 DIAGNOSIS — L409 Psoriasis, unspecified: Secondary | ICD-10-CM | POA: Diagnosis not present

## 2018-04-01 DIAGNOSIS — M1990 Unspecified osteoarthritis, unspecified site: Secondary | ICD-10-CM | POA: Insufficient documentation

## 2018-04-01 DIAGNOSIS — Z801 Family history of malignant neoplasm of trachea, bronchus and lung: Secondary | ICD-10-CM | POA: Insufficient documentation

## 2018-04-01 DIAGNOSIS — Z9884 Bariatric surgery status: Secondary | ICD-10-CM | POA: Diagnosis not present

## 2018-04-01 DIAGNOSIS — K219 Gastro-esophageal reflux disease without esophagitis: Secondary | ICD-10-CM | POA: Insufficient documentation

## 2018-04-01 DIAGNOSIS — Z79899 Other long term (current) drug therapy: Secondary | ICD-10-CM | POA: Diagnosis not present

## 2018-04-01 DIAGNOSIS — Z9049 Acquired absence of other specified parts of digestive tract: Secondary | ICD-10-CM | POA: Diagnosis not present

## 2018-04-01 DIAGNOSIS — E119 Type 2 diabetes mellitus without complications: Secondary | ICD-10-CM | POA: Insufficient documentation

## 2018-04-01 DIAGNOSIS — Z808 Family history of malignant neoplasm of other organs or systems: Secondary | ICD-10-CM | POA: Insufficient documentation

## 2018-04-01 DIAGNOSIS — R1084 Generalized abdominal pain: Secondary | ICD-10-CM | POA: Diagnosis not present

## 2018-04-01 DIAGNOSIS — D509 Iron deficiency anemia, unspecified: Secondary | ICD-10-CM | POA: Diagnosis not present

## 2018-04-01 DIAGNOSIS — Z823 Family history of stroke: Secondary | ICD-10-CM | POA: Insufficient documentation

## 2018-04-01 HISTORY — PX: LAPAROSCOPY: SHX197

## 2018-04-01 LAB — GLUCOSE, CAPILLARY
Glucose-Capillary: 64 mg/dL — ABNORMAL LOW (ref 70–99)
Glucose-Capillary: 127 mg/dL — ABNORMAL HIGH (ref 70–99)

## 2018-04-01 SURGERY — LAPAROSCOPY, DIAGNOSTIC
Anesthesia: General | Site: Abdomen

## 2018-04-01 MED ORDER — SUGAMMADEX SODIUM 200 MG/2ML IV SOLN
INTRAVENOUS | Status: AC
  Filled 2018-04-01: qty 2

## 2018-04-01 MED ORDER — SODIUM CHLORIDE (PF) 0.9 % IJ SOLN
INTRAMUSCULAR | Status: AC
  Filled 2018-04-01: qty 10

## 2018-04-01 MED ORDER — HYDROMORPHONE HCL 1 MG/ML IJ SOLN
INTRAMUSCULAR | Status: AC
  Filled 2018-04-01: qty 2

## 2018-04-01 MED ORDER — CHLORHEXIDINE GLUCONATE CLOTH 2 % EX PADS: 6.0000 | MEDICATED_PAD | Freq: Once | CUTANEOUS | Status: DC

## 2018-04-01 MED ORDER — PROPOFOL 10 MG/ML IV BOLUS
INTRAVENOUS | Status: DC | PRN
  Administered 2018-04-01: 170 mg via INTRAVENOUS

## 2018-04-01 MED ORDER — FENTANYL CITRATE (PF) 100 MCG/2ML IJ SOLN
INTRAMUSCULAR | Status: DC | PRN
  Administered 2018-04-01: 100 ug via INTRAVENOUS

## 2018-04-01 MED ORDER — MIDAZOLAM HCL 5 MG/5ML IJ SOLN
INTRAMUSCULAR | Status: DC | PRN
  Administered 2018-04-01: 2 mg via INTRAVENOUS

## 2018-04-01 MED ORDER — PROPOFOL 10 MG/ML IV BOLUS
INTRAVENOUS | Status: AC
  Filled 2018-04-01: qty 40

## 2018-04-01 MED ORDER — PANTOPRAZOLE SODIUM 40 MG PO TBEC
40.0000 mg | DELAYED_RELEASE_TABLET | Freq: Every day | ORAL | Status: DC
  Administered 2018-04-01: 40 mg via ORAL
  Filled 2018-04-01: qty 1

## 2018-04-01 MED ORDER — FENTANYL CITRATE (PF) 100 MCG/2ML IJ SOLN
INTRAMUSCULAR | Status: AC
  Filled 2018-04-01: qty 2

## 2018-04-01 MED ORDER — DEXAMETHASONE SODIUM PHOSPHATE 10 MG/ML IJ SOLN
INTRAMUSCULAR | Status: AC
  Filled 2018-04-01: qty 2

## 2018-04-01 MED ORDER — ACETAMINOPHEN 500 MG PO TABS
ORAL_TABLET | ORAL | Status: AC
  Administered 2018-04-01: 1000 mg via ORAL
  Filled 2018-04-01: qty 2

## 2018-04-01 MED ORDER — LIDOCAINE 2% (20 MG/ML) 5 ML SYRINGE
INTRAMUSCULAR | Status: DC | PRN
  Administered 2018-04-01: 80 mg via INTRAVENOUS

## 2018-04-01 MED ORDER — HEPARIN SODIUM (PORCINE) 5000 UNIT/ML IJ SOLN
INTRAMUSCULAR | Status: AC
  Administered 2018-04-01: 5000 [IU] via SUBCUTANEOUS
  Filled 2018-04-01: qty 1

## 2018-04-01 MED ORDER — HYDROMORPHONE HCL 1 MG/ML IJ SOLN
0.2500 mg | INTRAMUSCULAR | Status: DC | PRN
  Administered 2018-04-01 (×4): 0.5 mg via INTRAVENOUS

## 2018-04-01 MED ORDER — ROCURONIUM BROMIDE 10 MG/ML (PF) SYRINGE
PREFILLED_SYRINGE | INTRAVENOUS | Status: DC | PRN
  Administered 2018-04-01: 50 mg via INTRAVENOUS
  Administered 2018-04-01: 10 mg via INTRAVENOUS

## 2018-04-01 MED ORDER — 0.9 % SODIUM CHLORIDE (POUR BTL) OPTIME
TOPICAL | Status: DC | PRN
  Administered 2018-04-01: 1000 mL

## 2018-04-01 MED ORDER — ONDANSETRON HCL 4 MG/2ML IJ SOLN
INTRAMUSCULAR | Status: AC
  Filled 2018-04-01: qty 4

## 2018-04-01 MED ORDER — SUGAMMADEX SODIUM 200 MG/2ML IV SOLN
INTRAVENOUS | Status: DC | PRN
  Administered 2018-04-01: 200 mg via INTRAVENOUS

## 2018-04-01 MED ORDER — CEFAZOLIN SODIUM-DEXTROSE 2-4 GM/100ML-% IV SOLN
2.0000 g | INTRAVENOUS | Status: AC
  Administered 2018-04-01: 2 g via INTRAVENOUS

## 2018-04-01 MED ORDER — HYDROCODONE-ACETAMINOPHEN 5-325 MG PO TABS: 1.0000 | ORAL_TABLET | Freq: Four times a day (QID) | ORAL | 0 refills | Status: DC | PRN

## 2018-04-01 MED ORDER — LIDOCAINE HCL 2 % IJ SOLN
INTRAMUSCULAR | Status: AC
  Filled 2018-04-01: qty 20

## 2018-04-01 MED ORDER — KETAMINE HCL 10 MG/ML IJ SOLN
INTRAMUSCULAR | Status: DC | PRN
  Administered 2018-04-01 (×2): 10 mg via INTRAVENOUS

## 2018-04-01 MED ORDER — KETAMINE HCL 10 MG/ML IJ SOLN
INTRAMUSCULAR | Status: AC
  Filled 2018-04-01: qty 1

## 2018-04-01 MED ORDER — LIDOCAINE 2% (20 MG/ML) 5 ML SYRINGE
INTRAMUSCULAR | Status: DC | PRN
  Administered 2018-04-01: 1.5 mg/kg/h via INTRAVENOUS

## 2018-04-01 MED ORDER — DEXAMETHASONE SODIUM PHOSPHATE 10 MG/ML IJ SOLN
INTRAMUSCULAR | Status: DC | PRN
  Administered 2018-04-01: 10 mg via INTRAVENOUS

## 2018-04-01 MED ORDER — LACTATED RINGERS IV SOLN
INTRAVENOUS | Status: DC
  Administered 2018-04-01: 06:00:00 via INTRAVENOUS
  Administered 2018-04-01: 1000 mL via INTRAVENOUS

## 2018-04-01 MED ORDER — BUPIVACAINE LIPOSOME 1.3 % IJ SUSP
INTRAMUSCULAR | Status: DC | PRN
  Administered 2018-04-01: 20 mL

## 2018-04-01 MED ORDER — EPHEDRINE SULFATE-NACL 50-0.9 MG/10ML-% IV SOSY
PREFILLED_SYRINGE | INTRAVENOUS | Status: DC | PRN
  Administered 2018-04-01 (×4): 10 mg via INTRAVENOUS
  Administered 2018-04-01 (×2): 5 mg via INTRAVENOUS

## 2018-04-01 MED ORDER — EPHEDRINE 5 MG/ML INJ
INTRAVENOUS | Status: AC
  Filled 2018-04-01: qty 10

## 2018-04-01 MED ORDER — HEPARIN SODIUM (PORCINE) 5000 UNIT/ML IJ SOLN
5000.0000 [IU] | Freq: Once | INTRAMUSCULAR | Status: AC
  Administered 2018-04-01: 5000 [IU] via SUBCUTANEOUS

## 2018-04-01 MED ORDER — PROMETHAZINE HCL 25 MG/ML IJ SOLN: 6.2500 mg | INTRAMUSCULAR | Status: DC | PRN

## 2018-04-01 MED ORDER — ONDANSETRON HCL 4 MG/2ML IJ SOLN
INTRAMUSCULAR | Status: DC | PRN
  Administered 2018-04-01: 4 mg via INTRAVENOUS

## 2018-04-01 MED ORDER — CEFAZOLIN SODIUM-DEXTROSE 2-4 GM/100ML-% IV SOLN
INTRAVENOUS | Status: AC
  Filled 2018-04-01: qty 100

## 2018-04-01 MED ORDER — ROCURONIUM BROMIDE 100 MG/10ML IV SOLN
INTRAVENOUS | Status: AC
  Filled 2018-04-01: qty 1

## 2018-04-01 MED ORDER — ACETAMINOPHEN 500 MG PO TABS
1000.0000 mg | ORAL_TABLET | ORAL | Status: AC
  Administered 2018-04-01: 1000 mg via ORAL

## 2018-04-01 MED ORDER — LIDOCAINE 2% (20 MG/ML) 5 ML SYRINGE
INTRAMUSCULAR | Status: AC
  Filled 2018-04-01: qty 10

## 2018-04-01 MED ORDER — MIDAZOLAM HCL 2 MG/2ML IJ SOLN
INTRAMUSCULAR | Status: AC
  Filled 2018-04-01: qty 2

## 2018-04-01 SURGICAL SUPPLY — 64 items
BLADE EXTENDED COATED 6.5IN (ELECTRODE) IMPLANT
CABLE HIGH FREQUENCY MONO STRZ (ELECTRODE) IMPLANT
CHLORAPREP W/TINT 26ML (MISCELLANEOUS) IMPLANT
COUNTER NEEDLE 20 DBL MAG RED (NEEDLE) IMPLANT
COVER MAYO STAND STRL (DRAPES) IMPLANT
COVER SURGICAL LIGHT HANDLE (MISCELLANEOUS) ×2 IMPLANT
COVER WAND RF STERILE (DRAPES) IMPLANT
DECANTER SPIKE VIAL GLASS SM (MISCELLANEOUS) IMPLANT
DERMABOND ADVANCED (GAUZE/BANDAGES/DRESSINGS) ×1
DERMABOND ADVANCED .7 DNX12 (GAUZE/BANDAGES/DRESSINGS) ×1 IMPLANT
DEVICE SUT TI-KNOT TK 5X26 (MISCELLANEOUS) ×2 IMPLANT
DEVICE SUTURE ENDOST 10MM (ENDOMECHANICALS) ×2 IMPLANT
DEVICE TROCAR PUNCTURE CLOSURE (ENDOMECHANICALS) ×2 IMPLANT
DRAIN CHANNEL 19F RND (DRAIN) IMPLANT
DRAPE LAPAROSCOPIC ABDOMINAL (DRAPES) ×2 IMPLANT
DRSG OPSITE POSTOP 4X10 (GAUZE/BANDAGES/DRESSINGS) IMPLANT
DRSG OPSITE POSTOP 4X6 (GAUZE/BANDAGES/DRESSINGS) IMPLANT
DRSG OPSITE POSTOP 4X8 (GAUZE/BANDAGES/DRESSINGS) IMPLANT
ELECT L-HOOK LAP 45CM DISP (ELECTROSURGICAL) ×2
ELECT PENCIL ROCKER SW 15FT (MISCELLANEOUS) IMPLANT
ELECT REM PT RETURN 15FT ADLT (MISCELLANEOUS) ×2 IMPLANT
ELECTRODE L-HOOK LAP 45CM DISP (ELECTROSURGICAL) ×1 IMPLANT
EVACUATOR SILICONE 100CC (DRAIN) IMPLANT
GAUZE SPONGE 4X4 12PLY STRL (GAUZE/BANDAGES/DRESSINGS) IMPLANT
GLOVE BIOGEL M 8.0 STRL (GLOVE) ×2 IMPLANT
GOWN STRL REUS W/TWL XL LVL3 (GOWN DISPOSABLE) ×6 IMPLANT
HANDLE STAPLE EGIA 4 XL (STAPLE) IMPLANT
IRRIG SUCT STRYKERFLOW 2 WTIP (MISCELLANEOUS)
IRRIGATION SUCT STRKRFLW 2 WTP (MISCELLANEOUS) IMPLANT
LEGGING LITHOTOMY PAIR STRL (DRAPES) IMPLANT
PACK COLON (CUSTOM PROCEDURE TRAY) IMPLANT
PACK LAPAROSCOPY W LONG (CUSTOM PROCEDURE TRAY) ×2 IMPLANT
PENCIL ELECTRO RS 15 CORD (MISCELLANEOUS) ×2 IMPLANT
RELOAD TRI 45 ART MED THCK BLK (STAPLE) IMPLANT
RELOAD TRI 45 ART MED THCK PUR (STAPLE) IMPLANT
RELOAD TRI 60 ART MED THCK BLK (STAPLE) IMPLANT
RELOAD TRI 60 ART MED THCK PUR (STAPLE) IMPLANT
SCISSORS LAP 5X45 EPIX DISP (ENDOMECHANICALS) ×2 IMPLANT
SET TUBE SMOKE EVAC HIGH FLOW (TUBING) ×2 IMPLANT
SHEARS HARMONIC ACE PLUS 45CM (MISCELLANEOUS) IMPLANT
SLEEVE ADV FIXATION 5X100MM (TROCAR) ×4 IMPLANT
SPONGE DRAIN TRACH 4X4 STRL 2S (GAUZE/BANDAGES/DRESSINGS) IMPLANT
SPONGE LAP 18X18 RF (DISPOSABLE) IMPLANT
STAPLER VISISTAT 35W (STAPLE) IMPLANT
SUT ETHILON 3 0 PS 1 (SUTURE) IMPLANT
SUT MNCRL AB 4-0 PS2 18 (SUTURE) IMPLANT
SUT NOVA 1 T20/GS 25DT (SUTURE) IMPLANT
SUT PDS AB 1 CTX 36 (SUTURE) IMPLANT
SUT PDS AB 1 TP1 96 (SUTURE) IMPLANT
SUT SILK 2 0 (SUTURE)
SUT SILK 2 0 SH CR/8 (SUTURE) IMPLANT
SUT SILK 2-0 18XBRD TIE 12 (SUTURE) IMPLANT
SUT SILK 3 0 (SUTURE)
SUT SILK 3 0 SH CR/8 (SUTURE) IMPLANT
SUT SILK 3-0 18XBRD TIE 12 (SUTURE) IMPLANT
SUT SURGIDAC NAB ES-9 0 48 120 (SUTURE) ×4 IMPLANT
SUT VICRYL 0 UR6 27IN ABS (SUTURE) ×4 IMPLANT
SYR 10ML ECCENTRIC (SYRINGE) IMPLANT
TOWEL OR 17X26 10 PK STRL BLUE (TOWEL DISPOSABLE) IMPLANT
TOWEL OR NON WOVEN STRL DISP B (DISPOSABLE) IMPLANT
TRAY FOLEY MTR SLVR 16FR STAT (SET/KITS/TRAYS/PACK) IMPLANT
TROCAR ADV FIXATION 5X100MM (TROCAR) ×2 IMPLANT
TROCAR BLADELESS OPT 5 100 (ENDOMECHANICALS) ×2 IMPLANT
TROCAR XCEL 12X100 BLDLESS (ENDOMECHANICALS) ×2 IMPLANT

## 2018-04-01 NOTE — Anesthesia Preprocedure Evaluation (Addendum)
Anesthesia Evaluation  Patient identified by MRN, date of birth, ID band Patient awake    Reviewed: Allergy & Precautions, NPO status , Patient's Chart, lab work & pertinent test results  Airway Mallampati: II  TM Distance: >3 FB Neck ROM: Full    Dental no notable dental hx.    Pulmonary asthma , sleep apnea , former smoker,    Pulmonary exam normal breath sounds clear to auscultation       Cardiovascular negative cardio ROS Normal cardiovascular exam Rhythm:Regular Rate:Normal  ECG: NSR, rate 76   Neuro/Psych  Headaches, PSYCHIATRIC DISORDERS Anxiety Depression    GI/Hepatic Neg liver ROS, hiatal hernia, GERD  Medicated and Controlled,IBS (irritable bowel syndrome   Endo/Other  diabetes  Renal/GU negative Renal ROS     Musculoskeletal negative musculoskeletal ROS (+)   Abdominal   Peds  Hematology HLD   Anesthesia Other Findings Abdominal pain  Reproductive/Obstetrics hcg negative                            Anesthesia Physical Anesthesia Plan  ASA: III  Anesthesia Plan: General   Post-op Pain Management:    Induction: Intravenous  PONV Risk Score and Plan: 3 and Midazolam, Dexamethasone, Ondansetron and Treatment may vary due to age or medical condition  Airway Management Planned: Oral ETT  Additional Equipment:   Intra-op Plan:   Post-operative Plan: Extubation in OR  Informed Consent: I have reviewed the patients History and Physical, chart, labs and discussed the procedure including the risks, benefits and alternatives for the proposed anesthesia with the patient or authorized representative who has indicated his/her understanding and acceptance.     Dental advisory given  Plan Discussed with: CRNA  Anesthesia Plan Comments:         Anesthesia Quick Evaluation

## 2018-04-01 NOTE — Transfer of Care (Signed)
Immediate Anesthesia Transfer of Care Note  Patient: Claire [name]  Procedure(s) Performed: Procedure(s): LAPAROSCOPY DIAGNOSTIC ERAS PATHWAY ENTEROLYSIS, CECOPEXY (N/A)  Patient Location: PACU  Anesthesia Type:General  Level of Consciousness:  sedated, patient cooperative and responds to stimulation  Airway & Oxygen Therapy:Patient Spontanous Breathing and Patient connected to face mask oxgen  Post-op Assessment:  Report given to PACU RN and Post -op Vital signs reviewed and stable  Post vital signs:  Reviewed and stable  Last Vitals:  Vitals:   04/01/18 0540 04/01/18 0949  BP: 100/72 116/69  Pulse: 81 94  Resp:  (!) 26  Temp: 36.8 C   SpO2: 790% 383%    Complications: No apparent anesthesia complications

## 2018-04-01 NOTE — Interval H&P Note (Signed)
History and Physical Interval Note:  04/01/2018 7:44 AM  [name]  has presented today for surgery, with the diagnosis of abdominal pain  The various methods of treatment have been discussed with the patient and family. After consideration of risks, benefits and other options for treatment, the patient has consented to  Procedure(s): [place] (N/A) as a surgical intervention .  The patient's history has been reviewed, patient examined, no change in status, stable for surgery.  I have reviewed the patient's chart and labs.  Questions were answered to the patient's satisfaction.     Claire [name]

## 2018-04-01 NOTE — Op Note (Signed)
Claire [name]  [birth year] 01 April 2018    PCP:  Steele Sizer, MD   Surgeon: Kaylyn Lim, MD, FACS  Asst:  none  Anes:  general  Preop Dx: Abdominal pain-recurrent; prior sleeve gastrectomy at [facility] in 2012;  Point pain in periumbilical region Postop Dx: Recurrent hiatal hernia; floppy cecum with findings concerning cecal bascule or volvulus; periumbilical eventration in area of pain  Procedure: Laparoscopy; enterolysis; cecopexy, closure of periumbilical eventration Location Surgery: [facility] Complications: none  EBL:   minimal cc  Drains: none  Description of Procedure:  The patient was taken to OR 4 .  After anesthesia was administered and the patient was prepped  with Technicare and a timeout was performed.  Access to the abdomen was achieved with a 5 mm Optiview through the left upper quadrant.  A second 5 mm was placed in the left lower quadrant.  Surveyed the abdomen and found there to be no adhesions to the periumbilical region but there was a slight eventration in the point of tenderness that I had located before.  There were adhesions of the transverse colon up to the falciform which I took down sharply and then I explored the forgot.  I lifted the left lateral segment and could see she had a small recurrent hiatal hernia.  I placed a Nathanson retractor to was better evaluate this and went posteriorly and found that she had had probable pledgets or mesh placed posteriorly or in that plane was obliterated.  If she has significant reflux she may well need conversion from her sleeve gastrectomy to a Roux-en-Y gastric bypass.  The most striking finding that might explain her episodic severe cramping abdominal pain doubling over in syncope was a varies floppy cecum.  In the picture that I gave her I had grasped her cecum and brought it across the midline.  With that in a very redundant transverse colon she could have possibly of volvulus or a cecal bascule where  there is intermittent partial obstruction with pain.  I elected to place a 1112 through the umbilicus and that defect that I had mentioned and then using the Endo Stitch tacked the cecum to the right sidewall with a single Surgidek.  The umbilical trocar site was closed with 3 sutures of 0 Vicryl.  All the port sites were injected with Exparel.  Patient tolerated procedure well was taken to the recovery room in satisfactory condition.  The patient tolerated the procedure well and was taken to the PACU in stable condition.     Matt B. Hassell Done, [place], [facility], [place]

## 2018-04-01 NOTE — Anesthesia Postprocedure Evaluation (Signed)
Anesthesia Post Note  Patient: Claire [name]  Procedure(s) Performed: LAPAROSCOPY DIAGNOSTIC ERAS PATHWAY ENTEROLYSIS, CECOPEXY (N/A Abdomen)     Patient location during evaluation: PACU Anesthesia Type: General Level of consciousness: awake and alert Pain management: pain level controlled Vital Signs Assessment: post-procedure vital signs reviewed and stable Respiratory status: spontaneous breathing, nonlabored ventilation, respiratory function stable and patient connected to nasal cannula oxygen Cardiovascular status: blood pressure returned to baseline and stable Postop Assessment: no apparent nausea or vomiting Anesthetic complications: no    Last Vitals:  Vitals:   04/01/18 1100 04/01/18 1130  BP: (!) 88/52 98/62  Pulse: 90 89  Resp: 14 16  Temp: 36.6 C 36.6 C  SpO2: 93% 98%    Last Pain:  Vitals:   04/01/18 1130  TempSrc:   PainSc: 2                  Katiejo Gilroy P Emiliano Welshans

## 2018-04-01 NOTE — Discharge Instructions (Signed)
May shower and resume diet.

## 2018-04-01 NOTE — Anesthesia Procedure Notes (Signed)
Procedure Name: Intubation Date/Time: 04/01/2018 8:01 AM Performed by: Lavina Hamman, CRNA Pre-anesthesia Checklist: Patient identified, Emergency Drugs available, Suction available, Patient being monitored and Timeout performed Patient Re-evaluated:Patient Re-evaluated prior to induction Oxygen Delivery Method: Circle system utilized Preoxygenation: Pre-oxygenation with 100% oxygen Induction Type: IV induction Ventilation: Mask ventilation without difficulty Laryngoscope Size: Mac and 3 Grade View: Grade I Tube type: Oral Tube size: 7.0 mm Number of attempts: 1 Airway Equipment and Method: Stylet Placement Confirmation: ETT inserted through vocal cords under direct vision,  positive ETCO2,  CO2 detector and breath sounds checked- equal and bilateral Secured at: 21 cm Tube secured with: Tape Dental Injury: Teeth and Oropharynx as per pre-operative assessment

## 2018-04-02 ENCOUNTER — Encounter (HOSPITAL_COMMUNITY): Payer: Self-pay | Admitting: Surgery

## 2018-04-07 DIAGNOSIS — F429 Obsessive-compulsive disorder, unspecified: Secondary | ICD-10-CM | POA: Diagnosis not present

## 2018-04-07 DIAGNOSIS — F411 Generalized anxiety disorder: Secondary | ICD-10-CM | POA: Diagnosis not present

## 2018-04-07 DIAGNOSIS — F349 Persistent mood [affective] disorder, unspecified: Secondary | ICD-10-CM | POA: Diagnosis not present

## 2018-04-07 DIAGNOSIS — F5105 Insomnia due to other mental disorder: Secondary | ICD-10-CM | POA: Diagnosis not present

## 2018-04-10 ENCOUNTER — Encounter: Payer: Self-pay | Admitting: Family Medicine

## 2018-04-10 DIAGNOSIS — E538 Deficiency of other specified B group vitamins: Secondary | ICD-10-CM | POA: Insufficient documentation

## 2018-04-29 ENCOUNTER — Other Ambulatory Visit: Payer: Self-pay | Admitting: Family Medicine

## 2018-04-29 NOTE — Telephone Encounter (Signed)
Refill request for general medication. Diflucan   Last office visit 03/11/2018   Follow up on 06/11/2018

## 2018-05-08 DIAGNOSIS — L400 Psoriasis vulgaris: Secondary | ICD-10-CM | POA: Diagnosis not present

## 2018-05-08 DIAGNOSIS — D2261 Melanocytic nevi of right upper limb, including shoulder: Secondary | ICD-10-CM | POA: Diagnosis not present

## 2018-05-08 DIAGNOSIS — D2262 Melanocytic nevi of left upper limb, including shoulder: Secondary | ICD-10-CM | POA: Diagnosis not present

## 2018-05-08 DIAGNOSIS — D2272 Melanocytic nevi of left lower limb, including hip: Secondary | ICD-10-CM | POA: Diagnosis not present

## 2018-05-10 ENCOUNTER — Other Ambulatory Visit: Payer: Self-pay | Admitting: Family Medicine

## 2018-05-10 DIAGNOSIS — E119 Type 2 diabetes mellitus without complications: Secondary | ICD-10-CM

## 2018-05-22 DIAGNOSIS — M2550 Pain in unspecified joint: Secondary | ICD-10-CM | POA: Diagnosis not present

## 2018-05-22 DIAGNOSIS — M791 Myalgia, unspecified site: Secondary | ICD-10-CM | POA: Diagnosis not present

## 2018-05-22 DIAGNOSIS — M792 Neuralgia and neuritis, unspecified: Secondary | ICD-10-CM | POA: Diagnosis not present

## 2018-05-22 DIAGNOSIS — L409 Psoriasis, unspecified: Secondary | ICD-10-CM | POA: Diagnosis not present

## 2018-05-22 DIAGNOSIS — H9191 Unspecified hearing loss, right ear: Secondary | ICD-10-CM | POA: Diagnosis not present

## 2018-05-26 ENCOUNTER — Encounter: Payer: Self-pay | Admitting: Family Medicine

## 2018-06-05 DIAGNOSIS — F349 Persistent mood [affective] disorder, unspecified: Secondary | ICD-10-CM | POA: Diagnosis not present

## 2018-06-05 DIAGNOSIS — F429 Obsessive-compulsive disorder, unspecified: Secondary | ICD-10-CM | POA: Diagnosis not present

## 2018-06-05 DIAGNOSIS — F5105 Insomnia due to other mental disorder: Secondary | ICD-10-CM | POA: Diagnosis not present

## 2018-06-05 DIAGNOSIS — F411 Generalized anxiety disorder: Secondary | ICD-10-CM | POA: Diagnosis not present

## 2018-06-07 ENCOUNTER — Other Ambulatory Visit: Payer: Self-pay | Admitting: Family Medicine

## 2018-06-07 DIAGNOSIS — K449 Diaphragmatic hernia without obstruction or gangrene: Secondary | ICD-10-CM

## 2018-06-07 DIAGNOSIS — K219 Gastro-esophageal reflux disease without esophagitis: Secondary | ICD-10-CM

## 2018-06-10 DIAGNOSIS — G43839 Menstrual migraine, intractable, without status migrainosus: Secondary | ICD-10-CM | POA: Diagnosis not present

## 2018-06-10 DIAGNOSIS — G518 Other disorders of facial nerve: Secondary | ICD-10-CM | POA: Diagnosis not present

## 2018-06-10 DIAGNOSIS — M542 Cervicalgia: Secondary | ICD-10-CM | POA: Diagnosis not present

## 2018-06-10 DIAGNOSIS — G43019 Migraine without aura, intractable, without status migrainosus: Secondary | ICD-10-CM | POA: Diagnosis not present

## 2018-06-10 DIAGNOSIS — M791 Myalgia, unspecified site: Secondary | ICD-10-CM | POA: Diagnosis not present

## 2018-06-11 ENCOUNTER — Ambulatory Visit (INDEPENDENT_AMBULATORY_CARE_PROVIDER_SITE_OTHER): Payer: BLUE CROSS/BLUE SHIELD | Admitting: Family Medicine

## 2018-06-11 ENCOUNTER — Encounter: Payer: Self-pay | Admitting: Family Medicine

## 2018-06-11 ENCOUNTER — Other Ambulatory Visit: Payer: Self-pay

## 2018-06-11 VITALS — BP 127/84 | HR 73 | Temp 98.9°F | Ht 61.0 in | Wt 145.8 lb

## 2018-06-11 DIAGNOSIS — F5104 Psychophysiologic insomnia: Secondary | ICD-10-CM

## 2018-06-11 DIAGNOSIS — F325 Major depressive disorder, single episode, in full remission: Secondary | ICD-10-CM

## 2018-06-11 DIAGNOSIS — Z9884 Bariatric surgery status: Secondary | ICD-10-CM | POA: Diagnosis not present

## 2018-06-11 DIAGNOSIS — N761 Subacute and chronic vaginitis: Secondary | ICD-10-CM

## 2018-06-11 DIAGNOSIS — E119 Type 2 diabetes mellitus without complications: Secondary | ICD-10-CM | POA: Diagnosis not present

## 2018-06-11 DIAGNOSIS — E538 Deficiency of other specified B group vitamins: Secondary | ICD-10-CM | POA: Diagnosis not present

## 2018-06-11 DIAGNOSIS — G43109 Migraine with aura, not intractable, without status migrainosus: Secondary | ICD-10-CM

## 2018-06-11 DIAGNOSIS — J4530 Mild persistent asthma, uncomplicated: Secondary | ICD-10-CM

## 2018-06-11 DIAGNOSIS — I700 Atherosclerosis of aorta: Secondary | ICD-10-CM

## 2018-06-11 MED ORDER — FLUCONAZOLE 150 MG PO TABS: 150.0000 mg | ORAL_TABLET | ORAL | 0 refills | Status: DC

## 2018-06-11 MED ORDER — CYANOCOBALAMIN 1000 MCG/ML IJ SOLN: 1000.0000 ug | Freq: Once | INTRAMUSCULAR | 5 refills | Status: AC

## 2018-06-11 NOTE — Progress Notes (Signed)
Name: Claire [name]   MRN: [id number]    DOB: Aug 03, 1970   Date:06/11/2018       Progress Note  Subjective  Chief Complaint  Chief Complaint  Patient presents with   Follow-up    3 month   Diabetes   Migraine   Depression   Gastroesophageal Reflux   Insomnia   Asthma   B12 Injection    insurance does not cover when she comes in office but she can get it done at [place] for $10.    I connected with  ALITZEL COOKSON  on 06/11/18 at  3:00 PM EDT by a video enabled telemedicine application and verified that I am speaking with the correct person using two identifiers.  I discussed the limitations of evaluation and management by telemedicine and the availability of in person appointments. The patient expressed understanding and agreed to proceed. Staff also discussed with the patient that there may be a patient responsible charge related to this service. Patient Location: at home  Provider Location: [name]  HPI  Major Depression: she is now seeing Dr. Nicolasa Ducking who is also giving her therapy. On zoloft and now taking Lunesta for sleep. She had side effects with Ambien ( interrupted sleep and sleep walking) , Trazodone ( sweating)  or Temazepam ( groggy ), she is now on Remeron for the past week and is doing well on medication. . Asthma MildIntermittentt:she is doing well on singulair, uses albuterol prn only and has been doing well, symptoms are worse during the Spring.She denies coughing or wheezing but has noticed some SOB with spring time, but she also has gained weight and not as active lately.   Migraine: she is still seeing Dr. Domingo Cocking - now off Topamax and is on Zonegran, and is doing well at this time, no pain at this time. Migraines episodes very seldom since depression under better control. Only one episode in the past month .   DMII: diagnosed at age 37, took medication for a while, but stopped all medications 10/25/2010 after  bariatric surgery,she has not been checking glucose at home,no polyphagia, polyuria or polydipsia. Maximum weight of 265 lbs and was down to 118.3lbs - lowest weight back in 08/2017), she is now gaining weight since she is feeling better after umbilical hernia repair, lysis of adhesions and cecopexy back in 04/01/2018. She is back on Ozempic , she will resume physical activity   Recurrent abdominal pain: doing much better since surgery, down to only two episodes of severe abdominal pain since  umbilical hernia repair, lysis of adhesions and cecopexy back in 04/01/2018. Episodes were multiple times a week associated with syncope and visits to [facility]  Atherosclerosis of aorta and hyperlipidemia: on statin therapy, no side effects, no chest pain or palpitation   B12 deficiency: she would like to go back on injections but get it at [facility] to decrease cost   Patient Active Problem List   Diagnosis Date Noted   B12 deficiency 04/10/2018   Columnar epithelial-lined lower esophagus    Postprocedural disorder of digestive system    Pain, abdominal, epigastric    Esophageal dysphagia    Generalized abdominal pain    Persistent mood (affective) disorder, unspecified ([place]) 01/15/2018   Insomnia related to another mental disorder 01/15/2018   OCD (obsessive compulsive disorder) 10/29/2017   GAD (generalized anxiety disorder) 10/29/2017   Nephrolithiasis 10/22/2017   Atherosclerosis of aorta ([place]) 12/21/2016   Sacroiliac inflammation ([place]) 11/19/2016  Hiatal hernia 08/17/2016   Diverticulosis of colon 08/17/2016   Iron deficiency anemia 08/08/2015   Lumbosacral pain 01/07/2015   Muscle twitching 01/07/2015   Intertrigo 11/05/2014   Perennial allergic rhinitis 09/03/2014   Lung nodules 09/03/2014   Vitamin D deficiency 09/03/2014   Diet-controlled diabetes mellitus ([place]) 09/03/2014   History of kidney stones 09/03/2014   Chronic insomnia 09/03/2014   History of  iron deficiency anemia 09/03/2014   Depression with anxiety 09/03/2014   Palpitations 06/22/2014   Bariatric surgery status 12/03/2013   Asthma, mild persistent 11/21/2012   CN (constipation) 11/21/2012   GERD without esophagitis 11/21/2012   History of hyperlipidemia 11/21/2012   Migraine with aura and without status migrainosus 11/21/2012   History of sleep apnea 11/21/2012   IBS (irritable bowel syndrome) 08/15/2012    Past Surgical History:  Procedure Laterality Date   BLADDER SURGERY  2010   BREAST BIOPSY Right 2014   stereotatic biopsy   CHOLECYSTECTOMY  2010   COLONOSCOPY  2014    Done at [place] N/A 01/28/2018   Procedure: COLONOSCOPY WITH PROPOFOL;  Surgeon: Virgel Manifold, MD;  Location: [facility];  Service: Endoscopy;  Laterality: N/A;   DILITATION & CURRETTAGE/HYSTROSCOPY WITH NOVASURE ABLATION N/A 06/16/2015   Procedure: DILATATION & CURETTAGE/HYSTEROSCOPY WITH NOVASURE ABLATION;  Surgeon: Brien Few, MD;  Location: [facility];  Service: Gynecology;  Laterality: N/A;   ESOPHAGOGASTRODUODENOSCOPY (EGD) WITH PROPOFOL N/A 01/28/2018   Procedure: ESOPHAGOGASTRODUODENOSCOPY (EGD) WITH BIOPSIES;  Surgeon: Virgel Manifold, MD;  Location: [facility];  Service: Endoscopy;  Laterality: N/A;   LAPAROSCOPIC GASTRIC SLEEVE RESECTION  2012   LAPAROSCOPY N/A 06/16/2015   Procedure: LAPAROSCOPY DIAGNOSTIC;  Surgeon: Brien Few, MD;  Location: [facility];  Service: Gynecology;  Laterality: N/A;   LAPAROSCOPY N/A 04/01/2018   Procedure: LAPAROSCOPY DIAGNOSTIC ERAS PATHWAY ENTEROLYSIS, CECOPEXY;  Surgeon: Johnathan Hausen, MD;  Location: [facility];  Service: General;  Laterality: N/A;   LITHOTRIPSY  12/11/2017   laser lithotripsy   LYSIS OF ADHESION N/A 06/16/2015   Procedure: LYSIS OF ADHESION;  Surgeon: Brien Few, MD;  Location: [facility];  Service: Gynecology;  Laterality: N/A;   PLANTAR FASCIA SURGERY  Bilateral    ROBOTIC ASSISTED SALPINGO OOPHERECTOMY Right 06/16/2015   Procedure: ROBOTIC ASSISTED SALPINGO OOPHORECTOMY, EXCISION OF RIGHT CUL DE [place] MASS;  Surgeon: Brien Few, MD;  Location: [facility];  Service: Gynecology;  Laterality: Right;   TONSILLECTOMY     UPPER GI ENDOSCOPY      Family History  Problem Relation Age of Onset   Heart attack Mother    Stroke Mother    Multiple myeloma Mother    Hyperlipidemia Father    Heart attack Sister    Heart attack Maternal Uncle    Heart attack Paternal Grandfather    Breast cancer Paternal Aunt    Breast cancer Paternal Grandmother    Lung cancer Maternal Grandfather     Social History   Socioeconomic History   Marital status: Married    Spouse name: Merrilee Seashore    Number of children: 3   Years of education: Not on file   Highest education level: Some college, no degree  Occupational History   Not on file  Social Needs   Financial resource strain: Not very hard   Food insecurity:    Worry: Never true    Inability: Never true   Transportation needs:    Medical: No    Non-medical: No  Tobacco  Use   Smoking status: Former Smoker    Packs/day: 1.00    Years: 10.00    Pack years: 10.00    Types: Cigarettes    Last attempt to quit: 02/19/1993    Years since quitting: 25.3   Smokeless tobacco: Never Used  Substance and Sexual Activity   Alcohol use: Yes    Alcohol/week: 0.0 standard drinks    Comment: occasionally   Drug use: No   Sexual activity: Yes    Partners: Male    Comment: last sex 04 Jan 2018  Lifestyle   Physical activity:    Days per week: 2 days    Minutes per session: 30 min   Stress: Very much  Relationships   Social connections:    Talks on phone: More than three times a week    Gets together: Twice a week    Attends religious service: Never    Active member of club or organization: Yes    Attends meetings of clubs or organizations: 1 to 4 times per year    Relationship  status: Married   Intimate partner violence:    Fear of current or ex partner: No    Emotionally abused: No    Physically abused: No    Forced sexual activity: No  Other Topics Concern   Not on file  Social History Narrative   Not on file     Current Outpatient Medications:    albuterol (PROVENTIL HFA;VENTOLIN HFA) 108 (90 Base) MCG/ACT inhaler, TAKE 2 PUFFS EVERY 6 HOURS AS NEEDED FOR WHEEZING OR SHORTNESS OF BREATH. (VENTOLIN NOT COVERED) (Patient taking differently: Inhale 2 puffs into the lungs every 6 (six) hours as needed for wheezing or shortness of breath. TAKE 2 PUFFS EVERY 6 HOURS AS NEEDED FOR WHEEZING OR SHORTNESS OF BREATH. (VENTOLIN NOT COVERED)), Disp: 8.5 Inhaler, Rfl: 2   Ascorbic Acid (VITAMIN C) 1000 MG tablet, Take 1,000 mg by mouth., Disp: , Rfl:    aspirin EC 81 MG tablet, Take 81 mg by mouth daily., Disp: , Rfl:    atorvastatin (LIPITOR) 10 MG tablet, TAKE 1 TABLET BY MOUTH EVERY DAY (Patient taking differently: Take 10 mg by mouth daily. ), Disp: 90 tablet, Rfl: 3   calcium carbonate (OS-CAL) 600 MG TABS tablet, Take 600 mg by mouth daily with breakfast., Disp: , Rfl:    clobetasol ointment (TEMOVATE) 5.46 %, Apply 1 application topically daily as needed (psoriasis). , Disp: , Rfl: 0   Cyanocobalamin (VITAMIN B-12 PO), Take 2,500 mcg by mouth daily. , Disp: , Rfl:    cyclobenzaprine (FLEXERIL) 10 MG tablet, Take 1 tablet (10 mg total) by mouth at bedtime. Prn (Patient taking differently: Take 10 mg by mouth at bedtime as needed (migraine). ), Disp: 90 tablet, Rfl: 1   diclofenac sodium (VOLTAREN) 1 % GEL, APPLY TO AFFECTED AREA 4 TIMES A DAY, Disp: , Rfl:    eszopiclone (LUNESTA) 2 MG TABS tablet, Take 1 tablet (2 mg total) by mouth at bedtime as needed for sleep. Take immediately before bedtime (Patient taking differently: Take 2 mg by mouth at bedtime. Take immediately before bedtime), Disp: 30 tablet, Rfl: 0   ferrous sulfate 325 (65 FE) MG EC tablet,  Take 325 mg by mouth daily with breakfast. , Disp: , Rfl:    fluconazole (DIFLUCAN) 150 MG tablet, TAKE 1 TABLET (150 MG TOTAL) BY MOUTH EVERY OTHER DAY., Disp: 3 tablet, Rfl: 0   fluocinonide (LIDEX) 0.05 % external solution, APPLY TO AFFECTED  SCALP TWICE DAILY UNTIL CLEAR, THEN AS NEEDED, Disp: , Rfl:    gentamicin ointment (GARAMYCIN) 0.1 %, Apply 1 application topically daily as needed (nasal dryness). , Disp: , Rfl: 0   HYDROcodone-acetaminophen (NORCO/VICODIN) 5-325 MG tablet, Take 1 tablet by mouth every 6 (six) hours as needed for moderate pain., Disp: 20 tablet, Rfl: 0   ketoconazole (NIZORAL) 2 % shampoo, PLEASE SEE ATTACHED FOR DETAILED DIRECTIONS, Disp: , Rfl:    Lactobacillus (ACIDOPHILUS PO), Take 1 tablet by mouth daily., Disp: , Rfl:    loratadine (CLARITIN) 10 MG tablet, Take 10 mg by mouth daily., Disp: , Rfl:    mirtazapine (REMERON) 15 MG tablet, TAKE 1 TABLET BY MOUTH EVERYDAY AT BEDTIME, Disp: , Rfl:    mometasone (ELOCON) 0.1 % lotion, Apply 1 application topically daily as needed (psoriasis in the ears). , Disp: , Rfl: 4   mometasone (NASONEX) 50 MCG/ACT nasal spray, USE 2 SPRAYS IN EACH NOSTRIL AT BEDTIME (Patient taking differently: Place 2 sprays into the nose daily as needed (allergies). USE 2 SPRAYS IN EACH NOSTRIL AT BEDTIME), Disp: 48 g, Rfl: 1   montelukast (SINGULAIR) 10 MG tablet, TAKE 1 TABLET(10 MG) BY MOUTH AT BEDTIME (Patient taking differently: Take 10 mg by mouth at bedtime. TAKE 1 TABLET(10 MG) BY MOUTH AT BEDTIME), Disp: 90 tablet, Rfl: 1   OZEMPIC, 1 MG/DOSE, 2 MG/1.5ML SOPN, INJECT 1 MG INTO THE SKIN ONCE A WEEK., Disp: 9 mL, Rfl: 1   pantoprazole (PROTONIX) 40 MG tablet, TAKE 1 TABLET BY MOUTH EVERY DAY, Disp: 90 tablet, Rfl: 0   potassium citrate (UROCIT-K) 10 MEQ (1080 MG) SR tablet, Take 10 mEq by mouth 2 (two) times daily., Disp: , Rfl:    promethazine (PHENERGAN) 12.5 MG tablet, TAKE 1 TABLET BY MOUTH EVERY 6 HOURS AS NEEDED FOR  MIGRAINES WITH NAUSEA (Patient taking differently: Take 12.5 mg by mouth every 6 (six) hours as needed for nausea or vomiting. TAKE 1 TABLET BY MOUTH EVERY 6 HOURS AS NEEDED FOR MIGRAINES WITH NAUSEA), Disp: 20 tablet, Rfl: 0   sertraline (ZOLOFT) 100 MG tablet, Take 100 mg by mouth daily. , Disp: , Rfl: 0   SUMAtriptan (IMITREX) 100 MG tablet, No more than 2 in 24 hours (Patient taking differently: Take 100 mg by mouth every 2 (two) hours as needed for migraine. No more than 2 in 24 hours), Disp: 9 tablet, Rfl: 1   Vitamin D, Ergocalciferol, (DRISDOL) 50000 units CAPS capsule, Take 1 capsule (50,000 Units total) by mouth every 7 (seven) days. TAKE 1 CAPSULE EVERY 7 DAYS, Disp: 12 capsule, Rfl: 1   zonisamide (ZONEGRAN) 50 MG capsule, Take 150 capsules by mouth daily. , Disp: , Rfl: 2  Allergies  Allergen Reactions   Gabapentin     Bladder incontinence at higher dose   Linzess [Linaclotide]     Worsening of diarrhea   Other Hives and Other (See Comments)    Ricotta Cheese: flushed and hot   Adhesive [Tape] Rash    Some clear tapes   Cymbalta [Duloxetine Hcl] Palpitations    And photosensitivity   Tapentadol Rash    Some clear tapes   Vicodin [Hydrocodone-Acetaminophen] Itching    I personally reviewed active problem list, medication list, allergies, family history with the patient/caregiver today.   ROS  Ten systems reviewed and is negative except as mentioned in HPI   Objective  Virtual encounter, vitals done at home   Body mass index is 27.55 kg/m.  Physical Exam  Awake, alert and oriented and in no distress  PHQ2/9: Depression screen [facility] 2/9 06/11/2018 03/11/2018 12/02/2017 09/04/2017 05/22/2017  Decreased Interest 0 0 0 0 0  Down, Depressed, Hopeless 1 0 0 0 0  PHQ - 2 Score 1 0 0 0 0  Altered sleeping - 0 0 0 0  Tired, decreased energy - 1 0 0 3  Change in appetite - 1 0 0 0  Feeling bad or failure about yourself  - 0 0 0 0  Trouble concentrating - 0 0 0 3   Moving slowly or fidgety/restless - 0 0 0 0  Suicidal thoughts - 0 0 0 0  PHQ-9 Score - 2 0 0 6  Difficult doing work/chores - Not difficult at all Not difficult at all Not difficult at all Not difficult at all   PHQ-2/9 Result is negative.    Fall Risk: Fall Risk  06/11/2018 03/11/2018 12/02/2017 09/04/2017 05/22/2017  Falls in the past year? 0 0 No No No  Number falls in past yr: 0 0 - - -  Injury with Fall? 0 0 - - -    Assessment & Plan  1. Chronic insomnia  Doing well on medication given by Dr. Nicolasa Ducking  2. Diet-controlled diabetes mellitus ([place])  Recheck labs every 6 months   3. B12 deficiency  - cyanocobalamin (,VITAMIN B-12,) 1000 MCG/ML injection; Inject 1 mL (1,000 mcg total) into the muscle once for 1 dose.  Dispense: 1 mL; Refill: 5  4. History of bariatric surgery  Stable   5. Migraine with aura and without status migrainosus, not intractable  Continue follow up neurologist   6. Chronic vaginitis  - fluconazole (DIFLUCAN) 150 MG tablet; Take 1 tablet (150 mg total) by mouth every other day.  Dispense: 9 tablet; Refill: 0  7. Major depression in remission [facility])  Keep follow up with Dr. Nicolasa Ducking   8. Mild persistent asthma without complication  Stable  9. Atherosclerosis of aorta ([place])  I discussed the assessment and treatment plan with the patient. The patient was provided an opportunity to ask questions and all were answered. The patient agreed with the plan and demonstrated an understanding of the instructions.  The patient was advised to call back or seek an in-person evaluation if the symptoms worsen or if the condition fails to improve as anticipated.  I provided 25 minutes of non-face-to-face time during this encounter.

## 2018-06-13 ENCOUNTER — Other Ambulatory Visit: Payer: Self-pay

## 2018-06-13 DIAGNOSIS — N761 Subacute and chronic vaginitis: Secondary | ICD-10-CM

## 2018-06-13 MED ORDER — FLUCONAZOLE 150 MG PO TABS: 150.0000 mg | ORAL_TABLET | ORAL | 0 refills | Status: DC

## 2018-06-13 NOTE — Telephone Encounter (Signed)
[facility] is requesting a 31 day supply for Diflucan 150 mg. Please resend to local pharmacy.

## 2018-06-20 DIAGNOSIS — F411 Generalized anxiety disorder: Secondary | ICD-10-CM | POA: Diagnosis not present

## 2018-06-20 DIAGNOSIS — F5105 Insomnia due to other mental disorder: Secondary | ICD-10-CM | POA: Diagnosis not present

## 2018-06-20 DIAGNOSIS — F429 Obsessive-compulsive disorder, unspecified: Secondary | ICD-10-CM | POA: Diagnosis not present

## 2018-06-20 DIAGNOSIS — F349 Persistent mood [affective] disorder, unspecified: Secondary | ICD-10-CM | POA: Diagnosis not present

## 2018-08-07 ENCOUNTER — Other Ambulatory Visit: Payer: Self-pay | Admitting: Family Medicine

## 2018-08-07 DIAGNOSIS — J3089 Other allergic rhinitis: Secondary | ICD-10-CM

## 2018-08-08 DIAGNOSIS — L400 Psoriasis vulgaris: Secondary | ICD-10-CM | POA: Diagnosis not present

## 2018-08-08 DIAGNOSIS — B351 Tinea unguium: Secondary | ICD-10-CM | POA: Diagnosis not present

## 2018-08-26 DIAGNOSIS — F429 Obsessive-compulsive disorder, unspecified: Secondary | ICD-10-CM | POA: Diagnosis not present

## 2018-08-26 DIAGNOSIS — F349 Persistent mood [affective] disorder, unspecified: Secondary | ICD-10-CM | POA: Diagnosis not present

## 2018-08-26 DIAGNOSIS — F5105 Insomnia due to other mental disorder: Secondary | ICD-10-CM | POA: Diagnosis not present

## 2018-08-26 DIAGNOSIS — F411 Generalized anxiety disorder: Secondary | ICD-10-CM | POA: Diagnosis not present

## 2018-08-28 ENCOUNTER — Encounter: Payer: Self-pay | Admitting: Family Medicine

## 2018-09-06 ENCOUNTER — Other Ambulatory Visit: Payer: Self-pay | Admitting: Family Medicine

## 2018-09-06 DIAGNOSIS — K449 Diaphragmatic hernia without obstruction or gangrene: Secondary | ICD-10-CM

## 2018-09-06 DIAGNOSIS — K219 Gastro-esophageal reflux disease without esophagitis: Secondary | ICD-10-CM

## 2018-09-09 DIAGNOSIS — G518 Other disorders of facial nerve: Secondary | ICD-10-CM | POA: Diagnosis not present

## 2018-09-09 DIAGNOSIS — M542 Cervicalgia: Secondary | ICD-10-CM | POA: Diagnosis not present

## 2018-09-09 DIAGNOSIS — M791 Myalgia, unspecified site: Secondary | ICD-10-CM | POA: Diagnosis not present

## 2018-09-09 DIAGNOSIS — G43839 Menstrual migraine, intractable, without status migrainosus: Secondary | ICD-10-CM | POA: Diagnosis not present

## 2018-09-09 DIAGNOSIS — G43019 Migraine without aura, intractable, without status migrainosus: Secondary | ICD-10-CM | POA: Diagnosis not present

## 2018-09-12 ENCOUNTER — Encounter: Payer: Self-pay | Admitting: Family Medicine

## 2018-09-18 DIAGNOSIS — F411 Generalized anxiety disorder: Secondary | ICD-10-CM | POA: Diagnosis not present

## 2018-09-18 DIAGNOSIS — F5105 Insomnia due to other mental disorder: Secondary | ICD-10-CM | POA: Diagnosis not present

## 2018-09-18 DIAGNOSIS — F349 Persistent mood [affective] disorder, unspecified: Secondary | ICD-10-CM | POA: Diagnosis not present

## 2018-09-18 DIAGNOSIS — F429 Obsessive-compulsive disorder, unspecified: Secondary | ICD-10-CM | POA: Diagnosis not present

## 2018-09-22 DIAGNOSIS — M792 Neuralgia and neuritis, unspecified: Secondary | ICD-10-CM | POA: Diagnosis not present

## 2018-09-22 DIAGNOSIS — L409 Psoriasis, unspecified: Secondary | ICD-10-CM | POA: Diagnosis not present

## 2018-09-22 DIAGNOSIS — M2550 Pain in unspecified joint: Secondary | ICD-10-CM | POA: Diagnosis not present

## 2018-09-22 DIAGNOSIS — M791 Myalgia, unspecified site: Secondary | ICD-10-CM | POA: Diagnosis not present

## 2018-10-01 DIAGNOSIS — K562 Volvulus: Secondary | ICD-10-CM | POA: Diagnosis not present

## 2018-10-07 ENCOUNTER — Other Ambulatory Visit: Payer: Self-pay | Admitting: Surgery

## 2018-10-07 DIAGNOSIS — K562 Volvulus: Secondary | ICD-10-CM

## 2018-10-14 ENCOUNTER — Other Ambulatory Visit: Payer: Self-pay | Admitting: Surgery

## 2018-10-14 ENCOUNTER — Ambulatory Visit
Admission: RE | Admit: 2018-10-14 | Discharge: 2018-10-14 | Disposition: A | Discharge status: Home / Self Care | Payer: BC Managed Care – PPO | Source: Ambulatory Visit | Attending: Surgery | Admitting: Surgery

## 2018-10-14 DIAGNOSIS — K562 Volvulus: Secondary | ICD-10-CM

## 2018-10-14 DIAGNOSIS — K5730 Diverticulosis of large intestine without perforation or abscess without bleeding: Secondary | ICD-10-CM | POA: Diagnosis not present

## 2018-10-28 DIAGNOSIS — Z01419 Encounter for gynecological examination (general) (routine) without abnormal findings: Secondary | ICD-10-CM | POA: Diagnosis not present

## 2018-10-28 DIAGNOSIS — Z6832 Body mass index (BMI) 32.0-32.9, adult: Secondary | ICD-10-CM | POA: Diagnosis not present

## 2018-10-28 DIAGNOSIS — Z1151 Encounter for screening for human papillomavirus (HPV): Secondary | ICD-10-CM | POA: Diagnosis not present

## 2018-10-28 DIAGNOSIS — N951 Menopausal and female climacteric states: Secondary | ICD-10-CM | POA: Diagnosis not present

## 2018-10-28 DIAGNOSIS — Z1231 Encounter for screening mammogram for malignant neoplasm of breast: Secondary | ICD-10-CM | POA: Diagnosis not present

## 2018-10-28 DIAGNOSIS — N920 Excessive and frequent menstruation with regular cycle: Secondary | ICD-10-CM | POA: Diagnosis not present

## 2018-10-28 LAB — HM MAMMOGRAPHY
HM Mammogram: NORMAL (ref 0–4)
HM Mammogram: NORMAL (ref 0–4)

## 2018-10-29 ENCOUNTER — Encounter: Payer: Self-pay | Admitting: Family Medicine

## 2018-11-08 ENCOUNTER — Other Ambulatory Visit: Payer: Self-pay | Admitting: Family Medicine

## 2018-11-08 DIAGNOSIS — J4530 Mild persistent asthma, uncomplicated: Secondary | ICD-10-CM

## 2018-11-19 DIAGNOSIS — F5105 Insomnia due to other mental disorder: Secondary | ICD-10-CM | POA: Diagnosis not present

## 2018-11-19 DIAGNOSIS — F349 Persistent mood [affective] disorder, unspecified: Secondary | ICD-10-CM | POA: Diagnosis not present

## 2018-11-19 DIAGNOSIS — F429 Obsessive-compulsive disorder, unspecified: Secondary | ICD-10-CM | POA: Diagnosis not present

## 2018-11-19 DIAGNOSIS — F411 Generalized anxiety disorder: Secondary | ICD-10-CM | POA: Diagnosis not present

## 2018-11-26 ENCOUNTER — Other Ambulatory Visit: Payer: Self-pay | Admitting: Family Medicine

## 2018-11-26 DIAGNOSIS — E559 Vitamin D deficiency, unspecified: Secondary | ICD-10-CM

## 2018-11-26 DIAGNOSIS — J4520 Mild intermittent asthma, uncomplicated: Secondary | ICD-10-CM

## 2018-11-26 DIAGNOSIS — G43109 Migraine with aura, not intractable, without status migrainosus: Secondary | ICD-10-CM

## 2018-11-27 NOTE — Telephone Encounter (Signed)
Requested medication (s) are due for refill today: yes  Requested medication (s) are on the active medication list: yes  Last refill:  03/12/2018  Future visit scheduled:yes  Notes to clinic:  Refill cannot be delegated    Requested Prescriptions  Pending Prescriptions Disp Refills   promethazine (PHENERGAN) 12.5 MG tablet [Pharmacy Med Name: PROMETHAZINE 12.5 MG TABLET] 20 tablet 0    Sig: TAKE 1 TABLET BY MOUTH EVERY 6 HOURS AS NEEDED FOR MIGRAINES WITH NAUSEA     Not Delegated - Gastroenterology: Antiemetics Failed - 11/26/2018  8:39 PM      Failed - This refill cannot be delegated      Passed - Valid encounter within last 6 months    Recent Outpatient Visits          5 months ago Diet-controlled diabetes mellitus [facility])   [facility] Steele Sizer, MD   8 months ago Diet-controlled diabetes mellitus [facility])   [facility] Steele Sizer, MD   12 months ago GERD without esophagitis   [facility] Steele Sizer, MD   1 year ago Urinary frequency   [facility] Steele Sizer, MD   1 year ago Diet-controlled diabetes mellitus [facility])   [facility], Drue Stager, MD      Future Appointments            In 2 weeks Steele Sizer, MD [facility]            Vitamin D, Ergocalciferol, (DRISDOL) 1.25 MG (50000 UT) CAPS capsule [Pharmacy Med Name: VITAMIN D2 1.25MG (50,000 UNIT)] 12 capsule 1    Sig: Take 1 capsule (50,000 Units total) by mouth every 7 (seven) days. TAKE 1 CAPSULE EVERY 7 DAYS     Endocrinology:  Vitamins - Vitamin D Supplementation Failed - 11/26/2018  8:39 PM      Failed - 50,000 IU strengths are not delegated      Failed - Ca in normal range and within 360 days    Calcium  Date Value Ref Range Status  03/24/2018 8.6 (L) 8.9 - 10.3 mg/dL Final         Failed - Phosphate in normal range and within 360 days    No results found  for: PHOS       Failed - Vitamin D in normal range and within 360 days    Vit D, 25-Hydroxy  Date Value Ref Range Status  09/11/2017 50.8 30.0 - 100.0 ng/mL Final    Comment:    Vitamin D deficiency has been defined by the Institute of Medicine and an Endocrine Society practice guideline as a level of serum 25-OH vitamin D less than 20 ng/mL (1,2). The Endocrine Society went on to further define vitamin D insufficiency as a level between 21 and 29 ng/mL (2). 1. IOM (Institute of Medicine). 2010. Dietary reference    intakes for calcium and D. [place]: The    [employer]. 2. Holick MF, Binkley Thurmont, Bischoff-Ferrari HA, et al.    Evaluation, treatment, and prevention of vitamin D    deficiency: an Endocrine Society clinical practice    guideline. JCEM. 2011 Jul; 96(7):1911-30.          Passed - Valid encounter within last 12 months    Recent Outpatient Visits          5 months ago Diet-controlled diabetes mellitus [facility])   [facility] Steele Sizer, MD  8 months ago Diet-controlled diabetes mellitus [facility])   [facility] Steele Sizer, MD   12 months ago GERD without esophagitis   [facility] Steele Sizer, MD   1 year ago Urinary frequency   [facility] Steele Sizer, MD   1 year ago Diet-controlled diabetes mellitus [facility])   [facility] Steele Sizer, MD      Future Appointments            In 2 weeks Steele Sizer, MD [place]

## 2018-12-06 ENCOUNTER — Other Ambulatory Visit: Payer: Self-pay | Admitting: Family Medicine

## 2018-12-06 DIAGNOSIS — J3089 Other allergic rhinitis: Secondary | ICD-10-CM

## 2018-12-06 NOTE — Telephone Encounter (Signed)
Requested Prescriptions  Pending Prescriptions Disp Refills  . mometasone (NASONEX) 50 MCG/ACT nasal spray [Pharmacy Med Name: MOMETASONE FUROATE 50 MCG SPRY] 17 g 1    Sig: USE 2 SPRAYS IN EACH NOSTRIL AT BEDTIME AS NEEDED FOR ALLERGIES     Ear, Nose, and Throat: Nasal Preparations - Corticosteroids Passed - 12/06/2018  8:15 PM      Passed - Valid encounter within last 12 months    Recent Outpatient Visits          5 months ago Diet-controlled diabetes mellitus [facility])   [facility] Steele Sizer, MD   9 months ago Diet-controlled diabetes mellitus [facility])   [facility] Steele Sizer, MD   1 year ago GERD without esophagitis   [facility] Steele Sizer, MD   1 year ago Urinary frequency   [facility] Steele Sizer, MD   1 year ago Diet-controlled diabetes mellitus [facility])   [facility] Steele Sizer, MD      Future Appointments            In 6 days Steele Sizer, MD [facility], [facility]

## 2018-12-09 DIAGNOSIS — M542 Cervicalgia: Secondary | ICD-10-CM | POA: Diagnosis not present

## 2018-12-09 DIAGNOSIS — M791 Myalgia, unspecified site: Secondary | ICD-10-CM | POA: Diagnosis not present

## 2018-12-09 DIAGNOSIS — G518 Other disorders of facial nerve: Secondary | ICD-10-CM | POA: Diagnosis not present

## 2018-12-09 DIAGNOSIS — G43839 Menstrual migraine, intractable, without status migrainosus: Secondary | ICD-10-CM | POA: Diagnosis not present

## 2018-12-09 DIAGNOSIS — G43019 Migraine without aura, intractable, without status migrainosus: Secondary | ICD-10-CM | POA: Diagnosis not present

## 2018-12-12 ENCOUNTER — Other Ambulatory Visit: Payer: Self-pay

## 2018-12-12 ENCOUNTER — Encounter: Payer: Self-pay | Admitting: Family Medicine

## 2018-12-12 ENCOUNTER — Ambulatory Visit (INDEPENDENT_AMBULATORY_CARE_PROVIDER_SITE_OTHER): Payer: BC Managed Care – PPO | Admitting: Family Medicine

## 2018-12-12 VITALS — BP 120/79 | HR 76 | Wt 174.0 lb

## 2018-12-12 DIAGNOSIS — J4530 Mild persistent asthma, uncomplicated: Secondary | ICD-10-CM

## 2018-12-12 DIAGNOSIS — F325 Major depressive disorder, single episode, in full remission: Secondary | ICD-10-CM

## 2018-12-12 DIAGNOSIS — Z79899 Other long term (current) drug therapy: Secondary | ICD-10-CM

## 2018-12-12 DIAGNOSIS — Z9884 Bariatric surgery status: Secondary | ICD-10-CM | POA: Diagnosis not present

## 2018-12-12 DIAGNOSIS — K219 Gastro-esophageal reflux disease without esophagitis: Secondary | ICD-10-CM

## 2018-12-12 DIAGNOSIS — F411 Generalized anxiety disorder: Secondary | ICD-10-CM

## 2018-12-12 DIAGNOSIS — E119 Type 2 diabetes mellitus without complications: Secondary | ICD-10-CM

## 2018-12-12 DIAGNOSIS — E538 Deficiency of other specified B group vitamins: Secondary | ICD-10-CM

## 2018-12-12 DIAGNOSIS — F5104 Psychophysiologic insomnia: Secondary | ICD-10-CM

## 2018-12-12 DIAGNOSIS — I700 Atherosclerosis of aorta: Secondary | ICD-10-CM

## 2018-12-12 DIAGNOSIS — G43109 Migraine with aura, not intractable, without status migrainosus: Secondary | ICD-10-CM

## 2018-12-12 DIAGNOSIS — Z862 Personal history of diseases of the blood and blood-forming organs and certain disorders involving the immune mechanism: Secondary | ICD-10-CM

## 2018-12-12 DIAGNOSIS — N761 Subacute and chronic vaginitis: Secondary | ICD-10-CM

## 2018-12-12 DIAGNOSIS — E559 Vitamin D deficiency, unspecified: Secondary | ICD-10-CM

## 2018-12-12 DIAGNOSIS — Z13220 Encounter for screening for lipoid disorders: Secondary | ICD-10-CM

## 2018-12-12 MED ORDER — BLOOD GLUCOSE METER KIT: PACK | 2 refills | Status: DC

## 2018-12-12 MED ORDER — FLUCONAZOLE 150 MG PO TABS: 150.0000 mg | ORAL_TABLET | ORAL | 0 refills | Status: DC

## 2018-12-12 NOTE — Progress Notes (Signed)
Name: Claire [name]   MRN: [id number]    DOB: 05/14/1970   Date:12/12/2018       Progress Note  Subjective  Chief Complaint  No chief complaint on file.   I connected with  DEBBIE YEARICK  on 12/12/18 at  3:00 PM EDT by a video enabled telemedicine application and verified that I am speaking with the correct person using two identifiers.  I discussed the limitations of evaluation and management by telemedicine and the availability of in person appointments. The patient expressed understanding and agreed to proceed. Staff also discussed with the patient that there may be a patient responsible charge related to this service. Patient Location: at home  Provider Location: [facility]   HPI  Major Depression:she is now seeing Dr. Nicolasa Ducking who is also giving her therapy. On zoloft and now taking Lunesta for sleep. She had side effects with Ambien ( interrupted sleep and sleep walking) , Trazodone ( sweating) or Temazepam ( groggy ), she is now on Remeron for the past week and is doing well on medication. She states being home with her children in remote learning has been difficult, but they seem to be doing better now . Son diagnosed with depression  . Asthma MildIntermittentt: she has been using albuterol more often because the mask makes her feel suffocated. Discussed anxiety. Continue singulair and try to lose weight.   Migraine: she is still seeing Dr. Domingo Cocking - now off Topamax and is on Zonegran. She states for the past month she was having daily headaches, and saw him this past week, he thought it was stress headaches and today she is doing better. No recent episodes of migraine   DMII: diagnosed at age 46, took medication for a while, but stopped all medications 10/25/2010 after bariatric surgery,she has not been checking glucose at home,no polyphagia, polyuria or polydipsia. Maximum weight of 265 lbs and wasdown to 118.3lbs - lowest weight back in 08/2017),  she is now gaining weight since she is feeling better after umbilical hernia repair, lysis of adhesions and cecopexy back in 04/01/2018. Weight is up to 174 lbs today. She is back on Ozempic   Recurrent abdominal pain: doing much better since surgery, no recent abdominal pain, she had, lysis of adhesions and cecopexy back in 04/01/2018. She has gained weight since last visit 6 months ago but it may because the abdominal pain has decreased   Atherosclerosis of aorta and hyperlipidemia: on statin therapy, no side effects, no chest pain or palpitation. Explained she needs to have labs   B12 deficiency: she is back on B12 injections monthly , she gets rx from [facility]    Patient Active Problem List   Diagnosis Date Noted  . B12 deficiency 04/10/2018  . Columnar epithelial-lined lower esophagus   . Postprocedural disorder of digestive system   . Pain, abdominal, epigastric   . Esophageal dysphagia   . Generalized abdominal pain   . Persistent mood (affective) disorder, unspecified ([place]) 01/15/2018  . Insomnia related to another mental disorder 01/15/2018  . OCD (obsessive compulsive disorder) 10/29/2017  . GAD (generalized anxiety disorder) 10/29/2017  . Nephrolithiasis 10/22/2017  . Atherosclerosis of aorta ([place]) 12/21/2016  . Sacroiliac inflammation ([place]) 11/19/2016  . Hiatal hernia 08/17/2016  . Diverticulosis of colon 08/17/2016  . Iron deficiency anemia 08/08/2015  . Lumbosacral pain 01/07/2015  . Muscle twitching 01/07/2015  . Intertrigo 11/05/2014  . Perennial allergic rhinitis 09/03/2014  . Lung nodules 09/03/2014  . Vitamin  D deficiency 09/03/2014  . Diet-controlled diabetes mellitus ([place]) 09/03/2014  . History of kidney stones 09/03/2014  . Chronic insomnia 09/03/2014  . History of iron deficiency anemia 09/03/2014  . Depression with anxiety 09/03/2014  . Palpitations 06/22/2014  . Bariatric surgery status 12/03/2013  . Asthma, mild persistent 11/21/2012  . CN  (constipation) 11/21/2012  . GERD without esophagitis 11/21/2012  . History of hyperlipidemia 11/21/2012  . Migraine with aura and without status migrainosus 11/21/2012  . History of sleep apnea 11/21/2012  . IBS (irritable bowel syndrome) 08/15/2012    Past Surgical History:  Procedure Laterality Date  . BLADDER SURGERY  2010  . BREAST BIOPSY Right 2014   stereotatic biopsy  . CHOLECYSTECTOMY  2010  . COLONOSCOPY  2014    [facility] at [facility]  . COLONOSCOPY WITH PROPOFOL N/A 01/28/2018   Procedure: COLONOSCOPY WITH PROPOFOL;  Surgeon: Virgel Manifold, MD;  Location: [place];  Service: Endoscopy;  Laterality: N/A;  . DILITATION & CURRETTAGE/HYSTROSCOPY WITH NOVASURE ABLATION N/A 06/16/2015   Procedure: DILATATION & CURETTAGE/HYSTEROSCOPY WITH NOVASURE ABLATION;  Surgeon: Brien Few, MD;  Location: [facility];  Service: Gynecology;  Laterality: N/A;  . ESOPHAGOGASTRODUODENOSCOPY (EGD) WITH PROPOFOL N/A 01/28/2018   Procedure: ESOPHAGOGASTRODUODENOSCOPY (EGD) WITH BIOPSIES;  Surgeon: Virgel Manifold, MD;  Location: [place];  Service: Endoscopy;  Laterality: N/A;  . [place] RESECTION  2012  . LAPAROSCOPY N/A 06/16/2015   Procedure: LAPAROSCOPY DIAGNOSTIC;  Surgeon: Brien Few, MD;  Location: [facility];  Service: Gynecology;  Laterality: N/A;  . LAPAROSCOPY N/A 04/01/2018   Procedure: LAPAROSCOPY DIAGNOSTIC ERAS PATHWAY ENTEROLYSIS, CECOPEXY;  Surgeon: Johnathan Hausen, MD;  Location: [facility];  Service: General;  Laterality: N/A;  . LITHOTRIPSY  12/11/2017   laser lithotripsy  . LYSIS OF ADHESION N/A 06/16/2015   Procedure: LYSIS OF ADHESION;  Surgeon: Brien Few, MD;  Location: [facility];  Service: Gynecology;  Laterality: N/A;  . PLANTAR FASCIA SURGERY Bilateral   . ROBOTIC ASSISTED SALPINGO OOPHERECTOMY Right 06/16/2015   Procedure: ROBOTIC ASSISTED SALPINGO OOPHORECTOMY, EXCISION OF RIGHT CUL DE [place] MASS;  Surgeon: Brien Few, MD;   Location: [facility];  Service: Gynecology;  Laterality: Right;  . TONSILLECTOMY    . UPPER GI ENDOSCOPY      Family History  Problem Relation Age of Onset  . Heart attack Mother   . Stroke Mother   . Multiple myeloma Mother   . Hyperlipidemia Father   . Heart attack Sister   . Heart attack Maternal Uncle   . Heart attack Paternal Grandfather   . Breast cancer Paternal Aunt   . Breast cancer Paternal Grandmother   . Lung cancer Maternal Grandfather     Social History   Socioeconomic History  . Marital status: Married    Spouse name: Merrilee Seashore   . Number of children: 3  . Years of education: Not on file  . Highest education level: Some college, no degree  Occupational History  . Not on file  Social Needs  . Financial resource strain: Not very hard  . Food insecurity    Worry: Never true    Inability: Never true  . Transportation needs    Medical: No    Non-medical: No  Tobacco Use  . Smoking status: Former Smoker    Packs/day: 1.00    Years: 10.00    Pack years: 10.00    Types: Cigarettes    Quit date: 02/19/1993    Years since quitting: 25.8  .  Smokeless tobacco: Never Used  Substance and Sexual Activity  . Alcohol use: Yes    Alcohol/week: 0.0 standard drinks    Comment: occasionally  . Drug use: No  . Sexual activity: Yes    Partners: Male    Comment: last sex 04 Jan 2018  Lifestyle  . Physical activity    Days per week: 2 days    Minutes per session: 30 min  . Stress: Very much  Relationships  . Social Herbalist on phone: More than three times a week    Gets together: Twice a week    Attends religious service: Never    Active member of club or organization: Yes    Attends meetings of clubs or organizations: 1 to 4 times per year    Relationship status: Married  . Intimate partner violence    Fear of current or ex partner: No    Emotionally abused: No    Physically abused: No    Forced sexual activity: No  Other Topics Concern  . Not on file   Social History Narrative  . Not on file     Current Outpatient Medications:  .  albuterol (VENTOLIN HFA) 108 (90 Base) MCG/ACT inhaler, TAKE 2 PUFFS EVERY 6 HOURS AS NEEDED FOR WHEEZING OR SHORTNESS OF BREATH. (VENTOLIN NOT COVERED), Disp: 8.5 g, Rfl: 2 .  Ascorbic Acid (VITAMIN C) 1000 MG tablet, Take 1,000 mg by mouth., Disp: , Rfl:  .  aspirin EC 81 MG tablet, Take 81 mg by mouth daily., Disp: , Rfl:  .  atorvastatin (LIPITOR) 10 MG tablet, TAKE 1 TABLET BY MOUTH EVERY DAY (Patient taking differently: Take 10 mg by mouth daily. ), Disp: 90 tablet, Rfl: 3 .  calcium carbonate (OS-CAL) 600 MG TABS tablet, Take 600 mg by mouth daily with breakfast., Disp: , Rfl:  .  clobetasol ointment (TEMOVATE) 4.09 %, Apply 1 application topically daily as needed (psoriasis). , Disp: , Rfl: 0 .  cyclobenzaprine (FLEXERIL) 10 MG tablet, Take 1 tablet (10 mg total) by mouth at bedtime. Prn (Patient taking differently: Take 10 mg by mouth at bedtime as needed (migraine). ), Disp: 90 tablet, Rfl: 1 .  diclofenac sodium (VOLTAREN) 1 % GEL, APPLY TO AFFECTED AREA 4 TIMES A DAY, Disp: , Rfl:  .  ferrous sulfate 325 (65 FE) MG EC tablet, Take 325 mg by mouth daily with breakfast. , Disp: , Rfl:  .  fluconazole (DIFLUCAN) 150 MG tablet, Take 1 tablet (150 mg total) by mouth every other day., Disp: 9 tablet, Rfl: 0 .  fluocinonide (LIDEX) 0.05 % external solution, APPLY TO AFFECTED SCALP TWICE DAILY UNTIL CLEAR, THEN AS NEEDED, Disp: , Rfl:  .  gentamicin ointment (GARAMYCIN) 0.1 %, Apply 1 application topically daily as needed (nasal dryness). , Disp: , Rfl: 0 .  ketoconazole (NIZORAL) 2 % shampoo, PLEASE SEE ATTACHED FOR DETAILED DIRECTIONS, Disp: , Rfl:  .  Lactobacillus (ACIDOPHILUS PO), Take 1 tablet by mouth daily., Disp: , Rfl:  .  loratadine (CLARITIN) 10 MG tablet, Take 10 mg by mouth daily., Disp: , Rfl:  .  mirtazapine (REMERON) 15 MG tablet, TAKE 1 TABLET BY MOUTH EVERYDAY AT BEDTIME, Disp: , Rfl:  .   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 , Disp: , Rfl: 4 .  mometasone (NASONEX) 50 MCG/ACT nasal spray, USE 2 SPRAYS IN EACH NOSTRIL AT BEDTIME AS NEEDED FOR ALLERGIES, Disp: 17  g, Rfl: 1 .  montelukast (SINGULAIR) 10 MG tablet, Take 1 tablet (10 mg total) by mouth at bedtime. TAKE 1 TABLET(10 MG) BY MOUTH AT BEDTIME, Disp: 90 tablet, Rfl: 1 .  OZEMPIC, 1 MG/DOSE, 2 MG/1.5ML SOPN, INJECT 1 MG INTO THE SKIN ONCE A WEEK., Disp: 9 mL, Rfl: 1 .  pantoprazole (PROTONIX) 40 MG tablet, TAKE 1 TABLET BY MOUTH EVERY DAY, Disp: 90 tablet, Rfl: 0 .  potassium citrate (UROCIT-K) 10 MEQ (1080 MG) SR tablet, Take 10 mEq by mouth 2 (two) times daily., Disp: , Rfl:  .  promethazine (PHENERGAN) 12.5 MG tablet, TAKE 1 TABLET BY MOUTH EVERY 6 HOURS AS NEEDED FOR MIGRAINES WITH NAUSEA, Disp: 20 tablet, Rfl: 0 .  sertraline (ZOLOFT) 100 MG tablet, Take 100 mg by mouth daily. , Disp: , Rfl: 0 .  SUMAtriptan (IMITREX) 100 MG tablet, No more than 2 in 24 hours (Patient taking differently: Take 100 mg by mouth every 2 (two) hours as needed for migraine. No more than 2 in 24 hours), Disp: 9 tablet, Rfl: 1 .  Vitamin D, Ergocalciferol, (DRISDOL) 1.25 MG (50000 UT) CAPS capsule, TAKE 1 CAPSULE (50,000 UNITS TOTAL) BY MOUTH EVERY 7 (SEVEN) DAYS. TAKE 1 CAPSULE EVERY 7 DAYS, Disp: 12 capsule, Rfl: 1 .  zonisamide (ZONEGRAN) 50 MG capsule, Take 150 capsules by mouth daily. , Disp: , Rfl: 2  Allergies  Allergen Reactions  . Gabapentin     Bladder incontinence at higher dose  . Linzess [Linaclotide]     Worsening of diarrhea  . Other Hives and Other (See Comments)    Ricotta Cheese: flushed and hot  . Adhesive [Tape] Rash    Some clear tapes  . Cymbalta [Duloxetine Hcl] Palpitations    And photosensitivity  . Tapentadol Rash    Some clear tapes  . Vicodin [Hydrocodone-Acetaminophen] Itching    I personally reviewed active problem list, medication list,  allergies, family history, social history, health maintenance with the patient/caregiver today.   ROS  Ten systems reviewed and is negative except as mentioned in HPI   Objective  Virtual encounter, vitals not obtained.  There is no height or weight on file to calculate BMI.  Physical Exam  Awake, alert and oriented   PHQ2/9: Depression screen [facility] 2/9 12/12/2018 06/11/2018 03/11/2018 12/02/2017 09/04/2017  Decreased Interest 1 0 0 0 0  Down, Depressed, Hopeless 1 1 0 0 0  PHQ - 2 Score 2 1 0 0 0  Altered sleeping 1 - 0 0 0  Tired, decreased energy 1 - 1 0 0  Change in appetite 1 - 1 0 0  Feeling bad or failure about yourself  0 - 0 0 0  Trouble concentrating 3 - 0 0 0  Moving slowly or fidgety/restless 0 - 0 0 0  Suicidal thoughts 0 - 0 0 0  PHQ-9 Score 8 - 2 0 0  Difficult doing work/chores - - Not difficult at all Not difficult at all Not difficult at all  Some recent data might be hidden   PHQ-2/9 Result is positive.    Fall Risk: Fall Risk  06/11/2018 03/11/2018 12/02/2017 09/04/2017 05/22/2017  Falls in the past year? 0 0 No No No  Number falls in past yr: 0 0 - - -  Injury with Fall? 0 0 - - -     Assessment & Plan   1. Diet-controlled diabetes mellitus ([place])  - blood glucose meter kit and supplies; Dispense based on patient and insurance  preference. Use up to four times daily as directed. (FOR ICD-10 E10.9, E11.9).  Dispense: 1 each; Refill: 2  2. B12 deficiency  - CBC with Differential/Platelet - Vitamin B12  3. Migraine with aura and without status migrainosus, not intractable  Keep follow up with neurologist   4. History of bariatric surgery  Gaining weight   5. Mild persistent asthma without complication   6. Chronic insomnia  Seeing Psychiatrist   7. GERD without esophagitis  Doing well   8. Vitamin D deficiency  - VITAMIN D 25 Hydroxy (Vit-D Deficiency, Fractures)  9. GAD (generalized anxiety disorder)   10. History of iron  deficiency anemia  Recheck CBC  11. Atherosclerosis of aorta (HCC)  - Hemoglobin A1c - Microalbumin / creatinine urine ratio  12. Major depression in remission ([place])  - CBC with Differential/Platelet - Comprehensive metabolic panel  13. Lipid screening  - Lipid panel  14. Long-term use of high-risk medication  - CBC with Differential/Platelet - Comprehensive metabolic panel  15. Chronic vaginitis  - fluconazole (DIFLUCAN) 150 MG tablet; Take 1 tablet (150 mg total) by mouth every other day.  Dispense: 9 tablet; Refill: 0  I discussed the assessment and treatment plan with the patient. The patient was provided an opportunity to ask questions and all were answered. The patient agreed with the plan and demonstrated an understanding of the instructions.  The patient was advised to call back or seek an in-person evaluation if the symptoms worsen or if the condition fails to improve as anticipated.  I provided 25  minutes of non-face-to-face time during this encounter.

## 2018-12-18 ENCOUNTER — Ambulatory Visit (INDEPENDENT_AMBULATORY_CARE_PROVIDER_SITE_OTHER): Payer: BC Managed Care – PPO

## 2018-12-18 ENCOUNTER — Encounter: Payer: Self-pay | Admitting: Family Medicine

## 2018-12-18 ENCOUNTER — Other Ambulatory Visit: Payer: Self-pay

## 2018-12-18 DIAGNOSIS — Z23 Encounter for immunization: Secondary | ICD-10-CM

## 2018-12-30 ENCOUNTER — Other Ambulatory Visit: Payer: Self-pay | Admitting: Family Medicine

## 2018-12-30 DIAGNOSIS — J3089 Other allergic rhinitis: Secondary | ICD-10-CM

## 2018-12-31 DIAGNOSIS — Z79899 Other long term (current) drug therapy: Secondary | ICD-10-CM | POA: Diagnosis not present

## 2018-12-31 DIAGNOSIS — Z13220 Encounter for screening for lipoid disorders: Secondary | ICD-10-CM | POA: Diagnosis not present

## 2018-12-31 DIAGNOSIS — F325 Major depressive disorder, single episode, in full remission: Secondary | ICD-10-CM | POA: Diagnosis not present

## 2018-12-31 DIAGNOSIS — I700 Atherosclerosis of aorta: Secondary | ICD-10-CM | POA: Diagnosis not present

## 2018-12-31 DIAGNOSIS — E538 Deficiency of other specified B group vitamins: Secondary | ICD-10-CM | POA: Diagnosis not present

## 2018-12-31 DIAGNOSIS — E559 Vitamin D deficiency, unspecified: Secondary | ICD-10-CM | POA: Diagnosis not present

## 2019-01-01 LAB — COMPREHENSIVE METABOLIC PANEL
ALT: 15 IU/L (ref 0–32)
AST: 16 IU/L (ref 0–40)
Albumin/Globulin Ratio: 1.8 (ref 1.2–2.2)
Albumin: 4 g/dL (ref 3.8–4.8)
Alkaline Phosphatase: 73 IU/L (ref 39–117)
BUN/Creatinine Ratio: 12 (ref 9–23)
BUN: 8 mg/dL (ref 6–24)
Bilirubin Total: 0.5 mg/dL (ref 0.0–1.2)
CO2: 22 mmol/L (ref 20–29)
Calcium: 9 mg/dL (ref 8.7–10.2)
Chloride: 103 mmol/L (ref 96–106)
Creatinine, Ser: 0.69 mg/dL (ref 0.57–1.00)
GFR calc Af Amer: 121 mL/min/{1.73_m2} (ref >59)
GFR calc non Af Amer: 105 mL/min/{1.73_m2} (ref >59)
Globulin, Total: 2.2 g/dL (ref 1.5–4.5)
Glucose: 89 mg/dL (ref 65–99)
Potassium: 3.9 mmol/L (ref 3.5–5.2)
Sodium: 138 mmol/L (ref 134–144)
Total Protein: 6.2 g/dL (ref 6.0–8.5)

## 2019-01-01 LAB — MICROALBUMIN / CREATININE URINE RATIO
Creatinine, Urine: 99.1 mg/dL
Microalb/Creat Ratio: 5 mg/g creat (ref 0–29)
Microalbumin, Urine: 5 ug/mL

## 2019-01-01 LAB — CBC WITH DIFFERENTIAL/PLATELET
Basophils Absolute: 0.1 10*3/uL (ref 0.0–0.2)
Basos: 1 %
EOS (ABSOLUTE): 0.3 10*3/uL (ref 0.0–0.4)
Eos: 4 %
Hematocrit: 40 % (ref 34.0–46.6)
Hemoglobin: 13.4 g/dL (ref 11.1–15.9)
Immature Grans (Abs): 0 10*3/uL (ref 0.0–0.1)
Immature Granulocytes: 1 %
Lymphocytes Absolute: 1.2 10*3/uL (ref 0.7–3.1)
Lymphs: 18 %
MCH: 29.6 pg (ref 26.6–33.0)
MCHC: 33.5 g/dL (ref 31.5–35.7)
MCV: 89 fL (ref 79–97)
Monocytes Absolute: 0.5 10*3/uL (ref 0.1–0.9)
Monocytes: 7 %
Neutrophils Absolute: 4.7 10*3/uL (ref 1.4–7.0)
Neutrophils: 69 %
Platelets: 303 10*3/uL (ref 150–450)
RBC: 4.52 x10E6/uL (ref 3.77–5.28)
RDW: 12.7 % (ref 11.7–15.4)
WBC: 6.7 10*3/uL (ref 3.4–10.8)

## 2019-01-01 LAB — LIPID PANEL
Chol/HDL Ratio: 2.8 ratio (ref 0.0–4.4)
Cholesterol, Total: 179 mg/dL (ref 100–199)
HDL: 64 mg/dL (ref >39)
LDL Chol Calc (NIH): 97 mg/dL (ref 0–99)
Triglycerides: 99 mg/dL (ref 0–149)
VLDL Cholesterol Cal: 18 mg/dL (ref 5–40)

## 2019-01-01 LAB — HEMOGLOBIN A1C
Est. average glucose Bld gHb Est-mCnc: 97 mg/dL
Hgb A1c MFr Bld: 5 % (ref 4.8–5.6)

## 2019-01-01 LAB — VITAMIN B12: Vitamin B-12: 150 pg/mL — ABNORMAL LOW (ref 232–1245)

## 2019-01-01 LAB — VITAMIN D 25 HYDROXY (VIT D DEFICIENCY, FRACTURES): Vit D, 25-Hydroxy: 31.3 ng/mL (ref 30.0–100.0)

## 2019-01-07 ENCOUNTER — Other Ambulatory Visit: Payer: Self-pay

## 2019-01-07 ENCOUNTER — Ambulatory Visit (INDEPENDENT_AMBULATORY_CARE_PROVIDER_SITE_OTHER): Payer: BC Managed Care – PPO | Admitting: Family Medicine

## 2019-01-07 ENCOUNTER — Encounter: Payer: Self-pay | Admitting: Family Medicine

## 2019-01-07 VITALS — BP 113/79 | HR 65 | Wt 177.9 lb

## 2019-01-07 DIAGNOSIS — E538 Deficiency of other specified B group vitamins: Secondary | ICD-10-CM

## 2019-01-07 DIAGNOSIS — R109 Unspecified abdominal pain: Secondary | ICD-10-CM | POA: Diagnosis not present

## 2019-01-07 DIAGNOSIS — R42 Dizziness and giddiness: Secondary | ICD-10-CM

## 2019-01-07 DIAGNOSIS — R55 Syncope and collapse: Secondary | ICD-10-CM

## 2019-01-07 DIAGNOSIS — N761 Subacute and chronic vaginitis: Secondary | ICD-10-CM

## 2019-01-07 DIAGNOSIS — R110 Nausea: Secondary | ICD-10-CM

## 2019-01-07 DIAGNOSIS — R002 Palpitations: Secondary | ICD-10-CM

## 2019-01-07 DIAGNOSIS — R6889 Other general symptoms and signs: Secondary | ICD-10-CM | POA: Diagnosis not present

## 2019-01-07 MED ORDER — FLUCONAZOLE 150 MG PO TABS: 150.0000 mg | ORAL_TABLET | ORAL | 2 refills | Status: DC

## 2019-01-07 MED ORDER — CYANOCOBALAMIN 1000 MCG/ML IJ SOLN: 1000.0000 ug | INTRAMUSCULAR | 2 refills | Status: DC

## 2019-01-07 NOTE — Addendum Note (Signed)
Addended by: Steele Sizer F on: 01/07/2019 11:01 AM   Modules accepted: Orders

## 2019-01-07 NOTE — Progress Notes (Signed)
Name: Claire [name]   MRN: [id number]    DOB: Jul 04, 1970   Date:01/07/2019       Progress Note  Subjective  Chief Complaint  Chief Complaint  Patient presents with  . Dysautomia    Needs a referral to see if she has Dysautomia or not.    I connected with  NELLY SCRIVEN  on 01/07/19 at 10:00 AM EST by a video enabled telemedicine application and verified that I am speaking with the correct person using two identifiers.  I discussed the limitations of evaluation and management by telemedicine and the availability of in person appointments. The patient expressed understanding and agreed to proceed. Staff also discussed with the patient that there may be a patient responsible charge related to this service. Patient Location: at home  Provider Location: [facility]   HPI  Dizziness: she states episodes of nausea, dizziness, palpitation has been going on for over one year. She has orthostatic changes, she also has urinary incontinence even though she was recently seen by Urologist and sling still working. Recurrent episodes of severe abdominal pain and nausea. She has temperature dysregulation - she feels cold but is sweaty at times - worse at night time.  She also had some syncopal episodes within the last year . Evaluated by neurologist, cardiologist  seeing GI and also saw a surgeon earlier this year. She is concerned about Dysautonomy. She woul dlike to see Dr. Harriette Bouillon for evaluation. Last episode was about one month ago after abdominal pain episode.    Patient Active Problem List   Diagnosis Date Noted  . B12 deficiency 04/10/2018  . Columnar epithelial-lined lower esophagus   . Postprocedural disorder of digestive system   . Pain, abdominal, epigastric   . Esophageal dysphagia   . Generalized abdominal pain   . Persistent mood (affective) disorder, unspecified ([place]) 01/15/2018  . Insomnia related to another mental disorder 01/15/2018  . OCD  (obsessive compulsive disorder) 10/29/2017  . GAD (generalized anxiety disorder) 10/29/2017  . Nephrolithiasis 10/22/2017  . Atherosclerosis of aorta ([place]) 12/21/2016  . Sacroiliac inflammation ([place]) 11/19/2016  . Hiatal hernia 08/17/2016  . Diverticulosis of colon 08/17/2016  . Iron deficiency anemia 08/08/2015  . Lumbosacral pain 01/07/2015  . Muscle twitching 01/07/2015  . Intertrigo 11/05/2014  . Perennial allergic rhinitis 09/03/2014  . Lung nodules 09/03/2014  . Vitamin D deficiency 09/03/2014  . Diet-controlled diabetes mellitus ([place]) 09/03/2014  . History of kidney stones 09/03/2014  . Chronic insomnia 09/03/2014  . History of iron deficiency anemia 09/03/2014  . Depression with anxiety 09/03/2014  . Palpitations 06/22/2014  . Bariatric surgery status 12/03/2013  . Asthma, mild persistent 11/21/2012  . CN (constipation) 11/21/2012  . GERD without esophagitis 11/21/2012  . History of hyperlipidemia 11/21/2012  . Migraine with aura and without status migrainosus 11/21/2012  . History of sleep apnea 11/21/2012  . IBS (irritable bowel syndrome) 08/15/2012    Past Surgical History:  Procedure Laterality Date  . BLADDER SURGERY  2010  . BREAST BIOPSY Right 2014   stereotatic biopsy  . CHOLECYSTECTOMY  2010  . COLONOSCOPY  2014    Done at [facility]  . COLONOSCOPY WITH PROPOFOL N/A 01/28/2018   Procedure: COLONOSCOPY WITH PROPOFOL;  Surgeon: Virgel Manifold, MD;  Location: [place];  Service: Endoscopy;  Laterality: N/A;  . DILITATION & CURRETTAGE/HYSTROSCOPY WITH NOVASURE ABLATION N/A 06/16/2015   Procedure: DILATATION & CURETTAGE/HYSTEROSCOPY WITH NOVASURE ABLATION;  Surgeon: Brien Few, MD;  Location:  [facility];  Service: Gynecology;  Laterality: N/A;  . ESOPHAGOGASTRODUODENOSCOPY (EGD) WITH PROPOFOL N/A 01/28/2018   Procedure: ESOPHAGOGASTRODUODENOSCOPY (EGD) WITH BIOPSIES;  Surgeon: Virgel Manifold, MD;  Location: [place];  Service:  Endoscopy;  Laterality: N/A;  . [place] RESECTION  2012  . LAPAROSCOPY N/A 06/16/2015   Procedure: LAPAROSCOPY DIAGNOSTIC;  Surgeon: Brien Few, MD;  Location: [facility];  Service: Gynecology;  Laterality: N/A;  . LAPAROSCOPY N/A 04/01/2018   Procedure: LAPAROSCOPY DIAGNOSTIC ERAS PATHWAY ENTEROLYSIS, CECOPEXY;  Surgeon: Johnathan Hausen, MD;  Location: [facility];  Service: General;  Laterality: N/A;  . LITHOTRIPSY  12/11/2017   laser lithotripsy  . LYSIS OF ADHESION N/A 06/16/2015   Procedure: LYSIS OF ADHESION;  Surgeon: Brien Few, MD;  Location: [facility];  Service: Gynecology;  Laterality: N/A;  . PLANTAR FASCIA SURGERY Bilateral   . ROBOTIC ASSISTED SALPINGO OOPHERECTOMY Right 06/16/2015   Procedure: ROBOTIC ASSISTED SALPINGO OOPHORECTOMY, EXCISION OF RIGHT CUL DE [place] MASS;  Surgeon: Brien Few, MD;  Location: [facility];  Service: Gynecology;  Laterality: Right;  . TONSILLECTOMY    . UPPER GI ENDOSCOPY      Family History  Problem Relation Age of Onset  . Heart attack Mother   . Stroke Mother   . Multiple myeloma Mother   . Hyperlipidemia Father   . Heart attack Sister   . Heart attack Maternal Uncle   . Heart attack Paternal Grandfather   . Breast cancer Paternal Aunt   . Breast cancer Paternal Grandmother   . Lung cancer Maternal Grandfather     Social History   Socioeconomic History  . Marital status: Married    Spouse name: Merrilee Seashore   . Number of children: 3  . Years of education: Not on file  . Highest education level: Some college, no degree  Occupational History  . Not on file  Social Needs  . Financial resource strain: Not very hard  . Food insecurity    Worry: Never true    Inability: Never true  . Transportation needs    Medical: No    Non-medical: No  Tobacco Use  . Smoking status: Former Smoker    Packs/day: 1.00    Years: 10.00    Pack years: 10.00    Types: Cigarettes    Quit date: 02/19/1993    Years since quitting: 25.8  .  Smokeless tobacco: Never Used  Substance and Sexual Activity  . Alcohol use: Yes    Alcohol/week: 0.0 standard drinks    Comment: occasionally  . Drug use: No  . Sexual activity: Yes    Partners: Male    Comment: last sex 04 Jan 2018  Lifestyle  . Physical activity    Days per week: 2 days    Minutes per session: 30 min  . Stress: Very much  Relationships  . Social Herbalist on phone: More than three times a week    Gets together: Twice a week    Attends religious service: Never    Active member of club or organization: Yes    Attends meetings of clubs or organizations: 1 to 4 times per year    Relationship status: Married  . Intimate partner violence    Fear of current or ex partner: No    Emotionally abused: No    Physically abused: No    Forced sexual activity: No  Other Topics Concern  . Not on file  Social History Narrative  . Not  on file     Current Outpatient Medications:  .  Accu-Chek FastClix Lancets MISC, USE TO TEST UP TO 4 TIMES A DAY, Disp: , Rfl:  .  ACCU-CHEK GUIDE test strip, , Disp: , Rfl:  .  albuterol (VENTOLIN HFA) 108 (90 Base) MCG/ACT inhaler, TAKE 2 PUFFS EVERY 6 HOURS AS NEEDED FOR WHEEZING OR SHORTNESS OF BREATH. (VENTOLIN NOT COVERED), Disp: 8.5 g, Rfl: 2 .  Ascorbic Acid (VITAMIN C) 1000 MG tablet, Take 1,000 mg by mouth., Disp: , Rfl:  .  atorvastatin (LIPITOR) 10 MG tablet, TAKE 1 TABLET BY MOUTH EVERY DAY (Patient taking differently: Take 10 mg by mouth daily. ), Disp: 90 tablet, Rfl: 3 .  blood glucose meter kit and supplies, Dispense based on patient and insurance preference. Use up to four times daily as directed. (FOR ICD-10 E10.9, E11.9)., Disp: 1 each, Rfl: 2 .  clobetasol ointment (TEMOVATE) 1.61 %, Apply 1 application topically daily as needed (psoriasis). , Disp: , Rfl: 0 .  cyanocobalamin (,VITAMIN B-12,) 1000 MCG/ML injection, Inject 1,000 mcg into the muscle once., Disp: , Rfl:  .  cyclobenzaprine (FLEXERIL) 10 MG tablet,  Take 1 tablet (10 mg total) by mouth at bedtime. Prn (Patient taking differently: Take 10 mg by mouth at bedtime as needed (migraine). ), Disp: 90 tablet, Rfl: 1 .  diclofenac sodium (VOLTAREN) 1 % GEL, APPLY TO AFFECTED AREA 4 TIMES A DAY, Disp: , Rfl:  .  fluconazole (DIFLUCAN) 150 MG tablet, Take 1 tablet (150 mg total) by mouth every other day., Disp: 9 tablet, Rfl: 0 .  fluocinonide (LIDEX) 0.05 % external solution, APPLY TO AFFECTED SCALP TWICE DAILY UNTIL CLEAR, THEN AS NEEDED, Disp: , Rfl:  .  gentamicin ointment (GARAMYCIN) 0.1 %, Apply 1 application topically daily as needed (nasal dryness). , Disp: , Rfl: 0 .  ketoconazole (NIZORAL) 2 % shampoo, PLEASE SEE ATTACHED FOR DETAILED DIRECTIONS, Disp: , Rfl:  .  Lactobacillus (ACIDOPHILUS PO), Take 1 tablet by mouth daily., Disp: , Rfl:  .  loratadine (CLARITIN) 10 MG tablet, Take 10 mg by mouth daily., Disp: , Rfl:  .  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 , Disp: , Rfl: 4 .  mometasone (NASONEX) 50 MCG/ACT nasal spray, USE 2 SPRAYS IN EACH NOSTRIL AT BEDTIME AS NEEDED FOR ALLERGIES, Disp: 17 g, Rfl: 1 .  montelukast (SINGULAIR) 10 MG tablet, Take 1 tablet (10 mg total) by mouth at bedtime. TAKE 1 TABLET(10 MG) BY MOUTH AT BEDTIME, Disp: 90 tablet, Rfl: 1 .  ondansetron (ZOFRAN) 4 MG tablet, Take 4 mg by mouth every 4 (four) hours as needed., Disp: , Rfl:  .  OZEMPIC, 1 MG/DOSE, 2 MG/1.5ML SOPN, INJECT 1 MG INTO THE SKIN ONCE A WEEK., Disp: 9 mL, Rfl: 1 .  pantoprazole (PROTONIX) 40 MG tablet, TAKE 1 TABLET BY MOUTH EVERY DAY, Disp: 90 tablet, Rfl: 0 .  potassium citrate (UROCIT-K) 10 MEQ (1080 MG) SR tablet, Take 10 mEq by mouth 2 (two) times daily., Disp: , Rfl:  .  promethazine (PHENERGAN) 12.5 MG tablet, TAKE 1 TABLET BY MOUTH EVERY 6 HOURS AS NEEDED FOR MIGRAINES WITH NAUSEA, Disp: 20 tablet, Rfl: 0 .  SUMAtriptan (IMITREX) 100 MG tablet, No more than 2 in 24 hours (Patient  taking differently: Take 100 mg by mouth every 2 (two) hours as needed for migraine. No more than 2 in 24 hours), Disp: 9 tablet, Rfl: 1 .  traZODone (DESYREL) 100 MG tablet, Take 100-200 mg by mouth at bedtime as needed., Disp: , Rfl:  .  TRINTELLIX 10 MG TABS tablet, Take 10 mg by mouth every morning., Disp: , Rfl:  .  Vitamin D, Ergocalciferol, (DRISDOL) 1.25 MG (50000 UT) CAPS capsule, TAKE 1 CAPSULE (50,000 UNITS TOTAL) BY MOUTH EVERY 7 (SEVEN) DAYS. TAKE 1 CAPSULE EVERY 7 DAYS, Disp: 12 capsule, Rfl: 1 .  zonisamide (ZONEGRAN) 50 MG capsule, Take 150 capsules by mouth daily. , Disp: , Rfl: 2 .  aspirin EC 81 MG tablet, Take 81 mg by mouth daily., Disp: , Rfl:  .  calcium carbonate (OS-CAL) 600 MG TABS tablet, Take 600 mg by mouth daily with breakfast., Disp: , Rfl:  .  ferrous sulfate 325 (65 FE) MG EC tablet, Take 325 mg by mouth daily with breakfast. , Disp: , Rfl:  .  mirtazapine (REMERON) 15 MG tablet, TAKE 1 TABLET BY MOUTH EVERYDAY AT BEDTIME, Disp: , Rfl:  .  sertraline (ZOLOFT) 100 MG tablet, Take 100 mg by mouth daily. , Disp: , Rfl: 0  Allergies  Allergen Reactions  . Gabapentin     Bladder incontinence at higher dose  . Linzess [Linaclotide]     Worsening of diarrhea  . Other Hives and Other (See Comments)    Ricotta Cheese: flushed and hot  . Adhesive [Tape] Rash    Some clear tapes  . Cymbalta [Duloxetine Hcl] Palpitations    And photosensitivity  . Tapentadol Rash    Some clear tapes  . Vicodin [Hydrocodone-Acetaminophen] Itching    I personally reviewed active problem list, medication list, allergies, family history, social history, health maintenance with the patient/caregiver today.   ROS  Ten systems reviewed and is negative except as mentioned in HPI   Objective  Virtual encounter but vitals obtained at home  Today's Vitals   01/07/19 1020  BP: 113/79  Pulse: 65  Weight: 177 lb 14.4 oz (80.7 kg)  PainSc: 5    Body mass index is 33.61 kg/m.    There is no height or weight on file to calculate BMI.  Physical Exam  Awake, alert and oriented   PHQ2/9: Depression screen [facility] 2/9 01/07/2019 12/12/2018 06/11/2018 03/11/2018 12/02/2017  Decreased Interest 0 1 0 0 0  Down, Depressed, Hopeless 0 1 1 0 0  PHQ - 2 Score 0 2 1 0 0  Altered sleeping 0 1 - 0 0  Tired, decreased energy 1 1 - 1 0  Change in appetite 0 1 - 1 0  Feeling bad or failure about yourself  0 0 - 0 0  Trouble concentrating 1 3 - 0 0  Moving slowly or fidgety/restless 0 0 - 0 0  Suicidal thoughts 0 0 - 0 0  PHQ-9 Score 2 8 - 2 0  Difficult doing work/chores Somewhat difficult - - Not difficult at all Not difficult at all  Some recent data might be hidden   PHQ-2/9 Result is negative.    Fall Risk: Fall Risk  01/07/2019 12/12/2018 06/11/2018 03/11/2018 12/02/2017  Falls in the past year? 0 0 0 0 No  Number falls in past yr: 0 0 0 0 -  Injury with Fall? 0 0 0 0 -     Assessment & Plan  1. Syncope and collapse  - Ambulatory referral to Cardiology  2. Impaired regulation of body temperature  - Ambulatory referral to Cardiology  3. Dizziness  - Ambulatory referral to Cardiology  4. Intermittent abdominal  pain  - Ambulatory referral to Cardiology  5. Nausea  - Ambulatory referral to Cardiology  6. Palpitation  - Ambulatory referral to Cardiology   7. Chronic vaginitis  - fluconazole (DIFLUCAN) 150 MG tablet; Take 1 tablet (150 mg total) by mouth every other day.  Dispense: 9 tablet; Refill: 2  8. B12 deficiency  B12 is still low, she had not taken B12 injections the previous 3 weeks, we will try weekly injections and recheck next visit, if still low refer to hematologist   I discussed the assessment and treatment plan with the patient. The patient was provided an opportunity to ask questions and all were answered. The patient agreed with the plan and demonstrated an understanding of the instructions.  The patient was advised to call back  or seek an in-person evaluation if the symptoms worsen or if the condition fails to improve as anticipated.  I provided 15  minutes of non-face-to-face time during this encounter.

## 2019-01-14 ENCOUNTER — Encounter: Payer: Self-pay | Admitting: Family Medicine

## 2019-01-14 ENCOUNTER — Other Ambulatory Visit: Payer: Self-pay | Admitting: Family Medicine

## 2019-01-14 MED ORDER — "SAFETY SYRINGES/NEEDLE 20G X 1-1/2"" 3 ML MISC": 1.0000 | 0 refills | Status: DC

## 2019-01-21 ENCOUNTER — Encounter: Payer: Self-pay | Admitting: Family Medicine

## 2019-01-22 DIAGNOSIS — F411 Generalized anxiety disorder: Secondary | ICD-10-CM | POA: Diagnosis not present

## 2019-01-22 DIAGNOSIS — F429 Obsessive-compulsive disorder, unspecified: Secondary | ICD-10-CM | POA: Diagnosis not present

## 2019-01-22 DIAGNOSIS — F5105 Insomnia due to other mental disorder: Secondary | ICD-10-CM | POA: Diagnosis not present

## 2019-01-22 DIAGNOSIS — F349 Persistent mood [affective] disorder, unspecified: Secondary | ICD-10-CM | POA: Diagnosis not present

## 2019-01-23 ENCOUNTER — Other Ambulatory Visit: Payer: Self-pay | Admitting: Family Medicine

## 2019-01-23 DIAGNOSIS — N200 Calculus of kidney: Secondary | ICD-10-CM | POA: Diagnosis not present

## 2019-01-23 DIAGNOSIS — E119 Type 2 diabetes mellitus without complications: Secondary | ICD-10-CM

## 2019-01-23 MED ORDER — OZEMPIC (0.25 OR 0.5 MG/DOSE) 2 MG/1.5ML ~~LOC~~ SOPN: 0.5000 mg | PEN_INJECTOR | SUBCUTANEOUS | 0 refills | Status: DC

## 2019-01-24 ENCOUNTER — Other Ambulatory Visit: Payer: Self-pay | Admitting: Family Medicine

## 2019-01-24 DIAGNOSIS — J3089 Other allergic rhinitis: Secondary | ICD-10-CM

## 2019-02-03 DIAGNOSIS — D485 Neoplasm of uncertain behavior of skin: Secondary | ICD-10-CM | POA: Diagnosis not present

## 2019-02-07 ENCOUNTER — Other Ambulatory Visit: Payer: Self-pay | Admitting: Family Medicine

## 2019-02-07 DIAGNOSIS — I700 Atherosclerosis of aorta: Secondary | ICD-10-CM

## 2019-02-21 ENCOUNTER — Other Ambulatory Visit: Payer: Self-pay | Admitting: Family Medicine

## 2019-02-21 DIAGNOSIS — J3089 Other allergic rhinitis: Secondary | ICD-10-CM

## 2019-02-22 ENCOUNTER — Encounter: Payer: Self-pay | Admitting: Family Medicine

## 2019-03-01 ENCOUNTER — Other Ambulatory Visit: Payer: Self-pay | Admitting: Family Medicine

## 2019-03-01 DIAGNOSIS — K449 Diaphragmatic hernia without obstruction or gangrene: Secondary | ICD-10-CM

## 2019-03-01 DIAGNOSIS — K219 Gastro-esophageal reflux disease without esophagitis: Secondary | ICD-10-CM

## 2019-03-24 ENCOUNTER — Other Ambulatory Visit: Payer: Self-pay | Admitting: Family Medicine

## 2019-03-24 DIAGNOSIS — J3089 Other allergic rhinitis: Secondary | ICD-10-CM

## 2019-04-01 ENCOUNTER — Encounter: Payer: Self-pay | Admitting: Family Medicine

## 2019-04-02 ENCOUNTER — Other Ambulatory Visit: Payer: Self-pay

## 2019-04-02 DIAGNOSIS — Z20822 Contact with and (suspected) exposure to covid-19: Secondary | ICD-10-CM | POA: Diagnosis not present

## 2019-04-02 DIAGNOSIS — E119 Type 2 diabetes mellitus without complications: Secondary | ICD-10-CM | POA: Insufficient documentation

## 2019-04-02 DIAGNOSIS — Z7982 Long term (current) use of aspirin: Secondary | ICD-10-CM | POA: Diagnosis not present

## 2019-04-02 DIAGNOSIS — K5730 Diverticulosis of large intestine without perforation or abscess without bleeding: Secondary | ICD-10-CM | POA: Insufficient documentation

## 2019-04-02 DIAGNOSIS — Z79899 Other long term (current) drug therapy: Secondary | ICD-10-CM | POA: Diagnosis not present

## 2019-04-02 DIAGNOSIS — Z87891 Personal history of nicotine dependence: Secondary | ICD-10-CM | POA: Diagnosis not present

## 2019-04-02 DIAGNOSIS — R109 Unspecified abdominal pain: Secondary | ICD-10-CM | POA: Insufficient documentation

## 2019-04-02 DIAGNOSIS — K921 Melena: Secondary | ICD-10-CM | POA: Diagnosis present

## 2019-04-02 LAB — CBC
HCT: 41.6 % (ref 36.0–46.0)
Hemoglobin: 13.3 g/dL (ref 12.0–15.0)
MCH: 29 pg (ref 26.0–34.0)
MCHC: 32 g/dL (ref 30.0–36.0)
MCV: 90.8 fL (ref 80.0–100.0)
Platelets: 350 K/uL (ref 150–400)
RBC: 4.58 MIL/uL (ref 3.87–5.11)
RDW: 13.2 % (ref 11.5–15.5)
WBC: 11.5 K/uL — ABNORMAL HIGH (ref 4.0–10.5)
nRBC: 0 % (ref 0.0–0.2)

## 2019-04-02 LAB — COMPREHENSIVE METABOLIC PANEL WITH GFR
ALT: 21 U/L (ref 0–44)
AST: 16 U/L (ref 15–41)
Albumin: 3.6 g/dL (ref 3.5–5.0)
Alkaline Phosphatase: 72 U/L (ref 38–126)
Anion gap: 7 (ref 5–15)
BUN: 12 mg/dL (ref 6–20)
CO2: 25 mmol/L (ref 22–32)
Calcium: 8.7 mg/dL — ABNORMAL LOW (ref 8.9–10.3)
Chloride: 105 mmol/L (ref 98–111)
Creatinine, Ser: 0.71 mg/dL (ref 0.44–1.00)
GFR calc Af Amer: >60 mL/min
GFR calc non Af Amer: >60 mL/min
Glucose, Bld: 105 mg/dL — ABNORMAL HIGH (ref 70–99)
Potassium: 3.9 mmol/L (ref 3.5–5.1)
Sodium: 137 mmol/L (ref 135–145)
Total Bilirubin: 0.5 mg/dL (ref 0.3–1.2)
Total Protein: 6.7 g/dL (ref 6.5–8.1)

## 2019-04-02 LAB — LIPASE, BLOOD: Lipase: 30 U/L (ref 11–51)

## 2019-04-02 MED ORDER — SODIUM CHLORIDE 0.9% FLUSH
3.0000 mL | Freq: Once | INTRAVENOUS | Status: AC
  Administered 2019-04-03: 3 mL via INTRAVENOUS

## 2019-04-02 NOTE — Telephone Encounter (Signed)
Patient called office to discuss PCP response- per agent all nurses have left for the day. Call transferred to riage- but patient had disconnected. Attempted to call patient back- got VM- left message to call back- may speak to any nurse available for triage.

## 2019-04-02 NOTE — ED Triage Notes (Signed)
Pt comes via POV from home with c/o black stool and vomiting. Pt states this started about 4 days ago.  Pt states some weakness. Pt states some abdominal pain.

## 2019-04-03 ENCOUNTER — Emergency Department: Payer: No Typology Code available for payment source

## 2019-04-03 ENCOUNTER — Emergency Department
Admission: EM | Admit: 2019-04-03 | Discharge: 2019-04-03 | Disposition: A | Discharge status: Home / Self Care | Payer: No Typology Code available for payment source | Attending: Emergency Medicine | Admitting: Emergency Medicine

## 2019-04-03 DIAGNOSIS — K921 Melena: Secondary | ICD-10-CM

## 2019-04-03 DIAGNOSIS — R109 Unspecified abdominal pain: Secondary | ICD-10-CM

## 2019-04-03 LAB — URINALYSIS, COMPLETE (UACMP) WITH MICROSCOPIC
Bacteria, UA: NONE SEEN
Bilirubin Urine: NEGATIVE
Glucose, UA: NEGATIVE mg/dL
Ketones, ur: NEGATIVE mg/dL
Leukocytes,Ua: NEGATIVE
Nitrite: NEGATIVE
Protein, ur: NEGATIVE mg/dL
Specific Gravity, Urine: 1.01 (ref 1.005–1.030)
pH: 6 (ref 5.0–8.0)

## 2019-04-03 LAB — POC SARS CORONAVIRUS 2 AG: SARS Coronavirus 2 Ag: NEGATIVE

## 2019-04-03 LAB — HEMOGLOBIN AND HEMATOCRIT, BLOOD
HCT: 40.8 % (ref 36.0–46.0)
Hemoglobin: 13.2 g/dL (ref 12.0–15.0)

## 2019-04-03 MED ORDER — TRAMADOL HCL 50 MG PO TABS: 50.0000 mg | ORAL_TABLET | Freq: Four times a day (QID) | ORAL | 0 refills | Status: DC | PRN

## 2019-04-03 MED ORDER — IOHEXOL 9 MG/ML PO SOLN
500.0000 mL | Freq: Once | ORAL | Status: DC | PRN
  Administered 2019-04-03: 500 mL via ORAL

## 2019-04-03 MED ORDER — ONDANSETRON 4 MG PO TBDP: 4.0000 mg | ORAL_TABLET | Freq: Three times a day (TID) | ORAL | 0 refills | Status: DC | PRN

## 2019-04-03 MED ORDER — MORPHINE SULFATE (PF) 2 MG/ML IV SOLN
2.0000 mg | Freq: Once | INTRAVENOUS | Status: AC
  Administered 2019-04-03: 2 mg via INTRAVENOUS
  Filled 2019-04-03: qty 1

## 2019-04-03 MED ORDER — IOHEXOL 300 MG/ML  SOLN
100.0000 mL | Freq: Once | INTRAMUSCULAR | Status: AC | PRN
  Administered 2019-04-03: 100 mL via INTRAVENOUS

## 2019-04-03 MED ORDER — ONDANSETRON HCL 4 MG/2ML IJ SOLN
4.0000 mg | Freq: Once | INTRAMUSCULAR | Status: AC
  Administered 2019-04-03: 4 mg via INTRAVENOUS
  Filled 2019-04-03: qty 2

## 2019-04-03 MED ORDER — SODIUM CHLORIDE 0.9 % IV BOLUS
1000.0000 mL | Freq: Once | INTRAVENOUS | Status: AC
  Administered 2019-04-03: 04:00:00 1000 mL via INTRAVENOUS

## 2019-04-03 NOTE — ED Provider Notes (Signed)
[facility] Emergency Department Provider Note  ____________________________________________   First MD Initiated Contact with Patient 04/03/19 0321     (approximate)  I have reviewed the triage vital signs and the nursing notes.   HISTORY  Chief Complaint Melena and Emesis    HPI Claire [name] is a 49 y.o. female with below list of previous medical conditions  presents emergency department secondary to "black stools and vomiting with associated abdominal pain S predominantly right upper quadrant/epigastric.  Patient states that symptoms have been occurring for the past 4 days.  Patient denies any fever afebrile on presentation.  Patient denies any Pepto-Bismol or Imodium use.        Past Medical History:  Diagnosis Date  . Anemia   . Anxiety   . Arthritis    hands, hips  . Asthma   . Bilateral leg cramps   . Depression   . Diabetes mellitus without complication ([place])    history no longer a problem since weight loss surgery  . DJD (degenerative joint disease)   . GERD (gastroesophageal reflux disease)   . Headache    Migraines  . IBS (irritable bowel syndrome)   . Kidney stone   . Low serum vitamin B12   . Lung nodules   . Migraine    down to approx 4x/mo since starting meds  . Muscle spasm   . Psoriasis    vaginal area  . Seasonal allergies   . Sleep apnea    history no longer a problem since weight loss surgery  . Vitamin D deficiency     Patient Active Problem List   Diagnosis Date Noted  . B12 deficiency 04/10/2018  . Columnar epithelial-lined lower esophagus   . Postprocedural disorder of digestive system   . Pain, abdominal, epigastric   . Esophageal dysphagia   . Generalized abdominal pain   . Persistent mood (affective) disorder, unspecified ([place]) 01/15/2018  . Insomnia related to another mental disorder 01/15/2018  . OCD (obsessive compulsive disorder) 10/29/2017  . GAD (generalized anxiety disorder) 10/29/2017    . Nephrolithiasis 10/22/2017  . Atherosclerosis of aorta ([place]) 12/21/2016  . Sacroiliac inflammation ([place]) 11/19/2016  . Hiatal hernia 08/17/2016  . Diverticulosis of colon 08/17/2016  . Iron deficiency anemia 08/08/2015  . Lumbosacral pain 01/07/2015  . Muscle twitching 01/07/2015  . Intertrigo 11/05/2014  . Perennial allergic rhinitis 09/03/2014  . Lung nodules 09/03/2014  . Vitamin D deficiency 09/03/2014  . Diet-controlled diabetes mellitus ([place]) 09/03/2014  . History of kidney stones 09/03/2014  . Chronic insomnia 09/03/2014  . History of iron deficiency anemia 09/03/2014  . Depression with anxiety 09/03/2014  . Palpitations 06/22/2014  . Bariatric surgery status 12/03/2013  . Asthma, mild persistent 11/21/2012  . CN (constipation) 11/21/2012  . GERD without esophagitis 11/21/2012  . History of hyperlipidemia 11/21/2012  . Migraine with aura and without status migrainosus 11/21/2012  . History of sleep apnea 11/21/2012  . IBS (irritable bowel syndrome) 08/15/2012    Past Surgical History:  Procedure Laterality Date  . BLADDER SURGERY  2010  . BREAST BIOPSY Right 2014   stereotatic biopsy  . CHOLECYSTECTOMY  2010  . COLONOSCOPY  2014    Done at [facility]  . COLONOSCOPY WITH PROPOFOL N/A 01/28/2018   Procedure: COLONOSCOPY WITH PROPOFOL;  Surgeon: Virgel Manifold, MD;  Location: [place];  Service: Endoscopy;  Laterality: N/A;  . DILITATION & CURRETTAGE/HYSTROSCOPY WITH NOVASURE ABLATION N/A 06/16/2015   Procedure: DILATATION & CURETTAGE/HYSTEROSCOPY  WITH NOVASURE ABLATION;  Surgeon: Brien Few, MD;  Location: [facility];  Service: Gynecology;  Laterality: N/A;  . ESOPHAGOGASTRODUODENOSCOPY (EGD) WITH PROPOFOL N/A 01/28/2018   Procedure: ESOPHAGOGASTRODUODENOSCOPY (EGD) WITH BIOPSIES;  Surgeon: Virgel Manifold, MD;  Location: [place];  Service: Endoscopy;  Laterality: N/A;  . [place] RESECTION  2012  . LAPAROSCOPY N/A  06/16/2015   Procedure: LAPAROSCOPY DIAGNOSTIC;  Surgeon: Brien Few, MD;  Location: [facility];  Service: Gynecology;  Laterality: N/A;  . LAPAROSCOPY N/A 04/01/2018   Procedure: LAPAROSCOPY DIAGNOSTIC ERAS PATHWAY ENTEROLYSIS, CECOPEXY;  Surgeon: Johnathan Hausen, MD;  Location: [facility];  Service: General;  Laterality: N/A;  . LITHOTRIPSY  12/11/2017   laser lithotripsy  . LYSIS OF ADHESION N/A 06/16/2015   Procedure: LYSIS OF ADHESION;  Surgeon: Brien Few, MD;  Location: [facility];  Service: Gynecology;  Laterality: N/A;  . PLANTAR FASCIA SURGERY Bilateral   . ROBOTIC ASSISTED SALPINGO OOPHERECTOMY Right 06/16/2015   Procedure: ROBOTIC ASSISTED SALPINGO OOPHORECTOMY, EXCISION OF RIGHT CUL DE [place] MASS;  Surgeon: Brien Few, MD;  Location: [facility];  Service: Gynecology;  Laterality: Right;  . TONSILLECTOMY    . UPPER GI ENDOSCOPY      Prior to Admission medications   Medication Sig Start Date End Date Taking? Authorizing Provider  Accu-Chek FastClix Lancets MISC USE TO TEST UP TO 4 TIMES A DAY 12/12/18   [provider]  ACCU-CHEK GUIDE test strip  01/04/19   [provider]  albuterol (VENTOLIN HFA) 108 (90 Base) MCG/ACT inhaler TAKE 2 PUFFS EVERY 6 HOURS AS NEEDED FOR WHEEZING OR SHORTNESS OF BREATH. (VENTOLIN NOT COVERED) 11/27/18   Steele Sizer, MD  Ascorbic Acid (VITAMIN C) 1000 MG tablet Take 1,000 mg by mouth.    [provider]  aspirin EC 81 MG tablet Take 81 mg by mouth daily.    [provider]  atorvastatin (LIPITOR) 10 MG tablet TAKE 1 TABLET BY MOUTH EVERY DAY 02/08/19   Steele Sizer, MD  blood glucose meter kit and supplies Dispense based on patient and insurance preference. Use up to four times daily as directed. (FOR ICD-10 E10.9, E11.9). 12/12/18   Steele Sizer, MD  calcium carbonate (OS-CAL) 600 MG TABS tablet Take 600 mg by mouth daily with breakfast.    [provider]  clobetasol ointment (TEMOVATE) 0.17 % Apply 1  application topically daily as needed (psoriasis).  06/06/15   [provider]  cyanocobalamin (,VITAMIN B-12,) 1000 MCG/ML injection Inject 1 mL (1,000 mcg total) into the muscle once a week. 01/07/19   Steele Sizer, MD  cyclobenzaprine (FLEXERIL) 10 MG tablet Take 1 tablet (10 mg total) by mouth at bedtime. Prn Patient taking differently: Take 10 mg by mouth at bedtime as needed (migraine).  11/11/15   Steele Sizer, MD  diclofenac sodium (VOLTAREN) 1 % GEL APPLY TO AFFECTED AREA 4 TIMES A DAY 05/23/18   [provider]  ferrous sulfate 325 (65 FE) MG EC tablet Take 325 mg by mouth daily with breakfast.     [provider]  fluconazole (DIFLUCAN) 150 MG tablet Take 1 tablet (150 mg total) by mouth every other day. 01/07/19   Sowles, Drue Stager, MD  fluocinonide (LIDEX) 0.05 % external solution APPLY TO AFFECTED SCALP TWICE DAILY UNTIL CLEAR, THEN AS NEEDED 05/08/18   [provider]  gentamicin ointment (GARAMYCIN) 0.1 % Apply 1 application topically daily as needed (nasal dryness).  01/11/17   Beverly Gust, MD  ketoconazole (NIZORAL) 2 %  shampoo PLEASE SEE ATTACHED FOR DETAILED DIRECTIONS 05/08/18   [provider]  Lactobacillus (ACIDOPHILUS PO) Take 1 tablet by mouth daily.    [provider]  loratadine (CLARITIN) 10 MG tablet Take 10 mg by mouth daily.    [provider]  mometasone (ELOCON) 0.1 % lotion Apply 1 application topically daily as needed (psoriasis in the ears).  01/09/17   Beverly Gust, MD  mometasone (NASONEX) 50 MCG/ACT nasal spray USE 2 SPRAYS IN EACH NOSTRIL AT BEDTIME AS NEEDED FOR ALLERGIES 03/24/19   Ancil Boozer, Drue Stager, MD  montelukast (SINGULAIR) 10 MG tablet Take 1 tablet (10 mg total) by mouth at bedtime. TAKE 1 TABLET(10 MG) BY MOUTH AT BEDTIME 11/09/18   Sowles, Drue Stager, MD  ondansetron (ZOFRAN ODT) 4 MG disintegrating tablet Take 1 tablet (4 mg total) by mouth every 8 (eight) hours as needed. 04/03/19   Gregor Hams, MD  ondansetron (ZOFRAN) 4 MG tablet Take 4 mg by mouth every 4 (four) hours as needed. 12/09/18   [provider]  pantoprazole (PROTONIX) 40 MG tablet TAKE 1 TABLET BY MOUTH EVERY DAY 03/01/19   Ancil Boozer, Drue Stager, MD  potassium citrate (UROCIT-K) 10 MEQ (1080 MG) SR tablet Take 10 mEq by mouth 2 (two) times daily.    [provider]  promethazine (PHENERGAN) 12.5 MG tablet TAKE 1 TABLET BY MOUTH EVERY 6 HOURS AS NEEDED FOR MIGRAINES WITH NAUSEA 11/27/18   Sowles, Drue Stager, MD  Semaglutide,0.25 or 0.5MG/DOS, (OZEMPIC, 0.25 OR 0.5 MG/DOSE,) 2 MG/1.5ML SOPN Inject 0.5 mg into the skin once a week. 01/23/19   Steele Sizer, MD  SUMAtriptan (IMITREX) 100 MG tablet No more than 2 in 24 hours Patient taking differently: Take 100 mg by mouth every 2 (two) hours as needed for migraine. No more than 2 in 24 hours 08/23/17   Sowles, Drue Stager, MD  SYRINGE-NEEDLE, DISP, 3 ML (SAFETY SYRINGES/NEEDLE) 20G X 1-1/2" 3 ML MISC 1 each by Does not apply route every 30 (thirty) days. 01/14/19   Steele Sizer, MD  traMADol (ULTRAM) 50 MG tablet Take 1 tablet (50 mg total) by mouth every 6 (six) hours as needed. 04/03/19 04/02/20  Gregor Hams, MD  traZODone (DESYREL) 100 MG tablet Take 100-200 mg by mouth at bedtime as needed. 12/09/18   [provider]  TRINTELLIX 10 MG TABS tablet Take 10 mg by mouth every morning. 12/03/18   [provider]  Vitamin D, Ergocalciferol, (DRISDOL) 1.25 MG (50000 UT) CAPS capsule TAKE 1 CAPSULE (50,000 UNITS TOTAL) BY MOUTH EVERY 7 (SEVEN) DAYS. TAKE 1 CAPSULE EVERY 7 DAYS 11/27/18   Steele Sizer, MD  zonisamide (ZONEGRAN) 50 MG capsule Take 150 capsules by mouth daily.  02/10/16   Orie Rout, MD    Allergies Gabapentin, Linzess [linaclotide], Other, Adhesive [tape], Cymbalta [duloxetine hcl], and Tapentadol  Family History  Problem Relation Age of Onset  . Heart attack Mother   . Stroke Mother   . Multiple myeloma Mother   .  Hyperlipidemia Father   . Heart attack Sister   . Heart attack Maternal Uncle   . Heart attack Paternal Grandfather   . Breast cancer Paternal Aunt   . Breast cancer Paternal Grandmother   . Lung cancer Maternal Grandfather     Social History Social History   Tobacco Use  . Smoking status: Former Smoker    Packs/day: 1.00    Years: 10.00    Pack years: 10.00    Types: Cigarettes    Quit date:  02/19/1993    Years since quitting: 26.1  . Smokeless tobacco: Never Used  Substance Use Topics  . Alcohol use: Yes    Alcohol/week: 0.0 standard drinks    Comment: occasionally  . Drug use: No    Review of Systems Constitutional: No fever/chills Eyes: No visual changes. ENT: No sore throat. Cardiovascular: Denies chest pain. Respiratory: Denies shortness of breath. Gastrointestinal: Positive for abdominal pain vomiting and dark stools no diarrhea.  No constipation. Genitourinary: Negative for dysuria. Musculoskeletal: Negative for neck pain.  Negative for back pain. Integumentary: Negative for rash. Neurological: Negative for headaches, focal weakness or numbness.  ____________________________________________   PHYSICAL EXAM:  VITAL SIGNS: ED Triage Vitals  Enc Vitals Group     BP 04/02/19 1637 132/74     Pulse Rate 04/02/19 1637 79     Resp 04/02/19 1637 18     Temp 04/02/19 1637 98.1 F (36.7 C)     Temp Source 04/02/19 1637 Oral     SpO2 04/02/19 1637 99 %     Weight 04/02/19 1637 83.5 kg (184 lb)     Height 04/02/19 1637 1.549 m (5' 1" )     Head Circumference --      Peak Flow --      Pain Score 04/02/19 1639 6     Pain Loc --      Pain Edu? --      Excl. in [place]? --     Constitutional: Alert and oriented.  Eyes: Conjunctivae are normal.  Mouth/Throat: Patient is wearing a mask. Neck: No stridor.  No meningeal signs.   Cardiovascular: Normal rate, regular rhythm. Good peripheral circulation. Grossly normal heart sounds. Respiratory: Normal respiratory  effort.  No retractions. Gastrointestinal: Soft and nontender. No distention.  Guaiac negative Musculoskeletal: No lower extremity tenderness nor edema. No gross deformities of extremities. Neurologic:  Normal speech and language. No gross focal neurologic deficits are appreciated.  Skin:  Skin is warm, dry and intact. Psychiatric: Mood and affect are normal. Speech and behavior are normal.  ____________________________________________   LABS (all labs ordered are listed, but only abnormal results are displayed)  Labs Reviewed  COMPREHENSIVE METABOLIC PANEL - Abnormal; Notable for the following components:      Result Value   Glucose, Bld 105 (*)    Calcium 8.7 (*)    All other components within normal limits  CBC - Abnormal; Notable for the following components:   WBC 11.5 (*)    All other components within normal limits  URINALYSIS, COMPLETE (UACMP) WITH MICROSCOPIC - Abnormal; Notable for the following components:   Color, Urine COLORLESS (*)    APPearance CLEAR (*)    Hgb urine dipstick SMALL (*)    All other components within normal limits  LIPASE, BLOOD  HEMOGLOBIN AND HEMATOCRIT, BLOOD  POC SARS CORONAVIRUS 2 AG -  ED  POC SARS CORONAVIRUS 2 AG   ____________________________________________  EKG  ED ECG REPORT I, [place] Deloros Beretta, the attending physician, personally viewed and interpreted this ECG.   Date: 04/02/2019  EKG Time: 4:44 PM  Rate: 76  Rhythm: Normal sinus rhythm  Axis: Normal  Intervals: Normal  ST&T Change: None  ____________________________________________  RADIOLOGY I, [place] Pasqualina Colasurdo, personally viewed and evaluated these images (plain radiographs) as part of my medical decision making, as well as reviewing the written report by the radiologist.  ED MD interpretation: No acute findings noted in the abdomen and pelvis on CT scan.  Colonic diverticulosis without evidence of  diverticulitis.  Official radiology report(s): CT ABDOMEN PELVIS W  CONTRAST  Result Date: 04/03/2019 CLINICAL DATA:  Acute nonlocalized abdominal pain, melena EXAM: CT ABDOMEN AND PELVIS WITH CONTRAST TECHNIQUE: Multidetector CT imaging of the abdomen and pelvis was performed using the standard protocol following bolus administration of intravenous contrast. CONTRAST:  169m OMNIPAQUE IOHEXOL 300 MG/ML  SOLN COMPARISON:  CT 01/06/2018 FINDINGS: Lower chest: Lung bases are clear. Normal heart size. No pericardial effusion. Coronary artery calcifications are present. Hepatobiliary: No focal liver abnormality is seen. Patient is post cholecystectomy. Slight prominence of the biliary tree likely related to reservoir effect. No calcified intraductal gallstones. Pancreas: Unremarkable. No pancreatic ductal dilatation or surrounding inflammatory changes. Spleen: Normal in size without focal abnormality. Adrenals/Urinary Tract: Normal adrenal glands. Nonobstructing 3 mm calculus in the interpolar left kidney. No worrisome urolithiasis or hydronephrosis. No concerning renal masses. Mild bilateral symmetric perinephric stranding is unchanged from prior, a nonspecific finding though may correlate with either age or decreased renal function. Urinary bladder is unremarkable. Stomach/Bowel: Small hiatal hernia. Postsurgical changes along the greater curvature of the stomach may reflect prior sleeve gastrectomy. Duodenal sweep takes a normal course across the abdomen. No small bowel dilatation or wall thickening. A normal appendix is visualized. No colonic dilatation or wall thickening. Scattered colonic diverticula without focal pericolonic inflammation to suggest diverticulitis. Vascular/Lymphatic: Atherosclerotic plaque within the normal caliber aorta. No suspicious or enlarged lymph nodes in the included lymphatic chains. Reproductive: Normal appearance of the uterus and adnexal structures. Other: No abdominopelvic free fluid, free air, abscess or organized collection. Small fat containing  umbilical hernia. No bowel containing hernia. Mild posterior body wall edema. Musculoskeletal: Multilevel degenerative changes are present in the imaged portions of the spine. No acute osseous abnormality or suspicious osseous lesion. IMPRESSION: 1. No CT evident acute findings in the abdomen or pelvis to provide cause for patient's abdominal pain and melena. 2. Colonic diverticulosis without evidence of diverticulitis. 3. Small hiatal hernia.  Evidence of prior sleeve gastrectomy. 4. Coronary artery calcifications. 5. Aortic Atherosclerosis (ICD10-I70.0). Electronically Signed   By: PLovena LeM.D.   On: 04/03/2019 05:19     Procedures   ____________________________________________   INITIAL IMPRESSION / [place] / [place]/ [place]RSE  As part of my medical decision making, I reviewed the following data within the electronic MEDICAL RECORD NUMBER   [age]year old female presented with above-stated history and physical exam secondary to dark stools abdominal pain and vomiting.  Consider the possibility of diverticulitis diverticulosis peptic ulcer disease.  Laboratory data unremarkable including H&H which remained stable 13 and 41 respectively.  CT scan revealed no acute findings in the abdomen pelvis diverticulosis without evidence of diverticulitis.  Patient guaiac negative.  Patient advised to follow-up with Dr. TRoz[facility] Bo[facility]astroenterology who the patient is seen in the past  ____________________________________________  FINAL CLINICAL IMPRESSION(S) / ED DIAGNOSES  Final diagnoses:  Abdominal pain, unspecified abdominal location  Melena     MEDICATIONS GIVEN DURING THIS VISIT:  Medications  iohexol (OMNIPAQUE) 9 MG/ML oral solution 500 mL (500 mLs Oral Contrast Given 04/03/19 0353)  sodium chloride flush (NS) 0.9 % injection 3 mL (3 mLs Intravenous Given 04/03/19 0432)  morphine 2 MG/ML injection 2 mg (2 mg Intravenous Given 04/03/19 0331)  ondansetron (ZOFRAN) injection 4 mg (4 mg  Intravenous Given 04/03/19 0331)  sodium chloride 0.9 % bolus 1,000 mL (0 mLs Intravenous Stopped 04/03/19 0434)  ondansetron (ZOFRAN) injection 4 mg (4 mg Intravenous Given 04/03/19 0432)  iohexol (  OMNIPAQUE) 300 MG/ML solution 100 mL (100 mLs Intravenous Contrast Given 04/03/19 0458)     ED Discharge Orders         Ordered    ondansetron (ZOFRAN ODT) 4 MG disintegrating tablet  Every 8 hours PRN     04/03/19 0635    traMADol (ULTRAM) 50 MG tablet  Every 6 hours PRN     04/03/19 0721          *Please note:  NOELANI HARBACH was evaluated in Emergency Department on 04/03/2019 for the symptoms described in the history of present illness. She was evaluated in the context of the global COVID-19 pandemic, which necessitated consideration that the patient might be at risk for infection with the SARS-CoV-2 virus that causes COVID-19. Institutional protocols and algorithms that pertain to the evaluation of patients at risk for COVID-19 are in a state of rapid change based on information released by regulatory bodies including the CDC and federal and state organizations. These policies and algorithms were followed during the patient's care in the ED.  Some ED evaluations and interventions may be delayed as a result of limited staffing during the pandemic.*  Note:  This document was prepared using Dragon voice recognition software and may include unintentional dictation errors.   Gregor Hams, MD 04/03/19 [phone]

## 2019-04-06 ENCOUNTER — Other Ambulatory Visit: Payer: Self-pay | Admitting: Family Medicine

## 2019-04-06 DIAGNOSIS — G43109 Migraine with aura, not intractable, without status migrainosus: Secondary | ICD-10-CM

## 2019-04-06 NOTE — Telephone Encounter (Signed)
Requested medication (s) are due for refill today:yes  Requested medication (s) are on the active medication list: yes  Last refill:  11/27/18  Future visit scheduled: yes  Notes to clinic: Medication not delegated    Requested Prescriptions  Pending Prescriptions Disp Refills   promethazine (PHENERGAN) 12.5 MG tablet [Pharmacy Med Name: PROMETHAZINE 12.5 MG TABLET] 20 tablet 0    Sig: TAKE 1 TABLET BY MOUTH EVERY 6 HOURS AS NEEDED FOR MIGRAINES WITH NAUSEA      Not Delegated - Gastroenterology: Antiemetics Failed - 04/06/2019  3:29 PM      Failed - This refill cannot be delegated      Passed - Valid encounter within last 6 months    Recent Outpatient Visits           2 months ago Syncope and collapse   [facility] Steele Sizer, MD   3 months ago Diet-controlled diabetes mellitus [facility])   [facility] Steele Sizer, MD   9 months ago Diet-controlled diabetes mellitus [facility])   [facility] Steele Sizer, MD   1 year ago Diet-controlled diabetes mellitus [name])   [facility] Steele Sizer, MD   1 year ago GERD without esophagitis   [facility] Steele Sizer, MD       Future Appointments             In 1 week Steele Sizer, MD [facility]

## 2019-04-08 ENCOUNTER — Ambulatory Visit (INDEPENDENT_AMBULATORY_CARE_PROVIDER_SITE_OTHER): Payer: 59 | Admitting: Gastroenterology

## 2019-04-08 ENCOUNTER — Telehealth: Payer: Self-pay

## 2019-04-08 ENCOUNTER — Encounter: Payer: Self-pay | Admitting: Gastroenterology

## 2019-04-08 ENCOUNTER — Other Ambulatory Visit: Payer: Self-pay

## 2019-04-08 DIAGNOSIS — K580 Irritable bowel syndrome with diarrhea: Secondary | ICD-10-CM | POA: Diagnosis not present

## 2019-04-08 DIAGNOSIS — K219 Gastro-esophageal reflux disease without esophagitis: Secondary | ICD-10-CM

## 2019-04-08 MED ORDER — OMEPRAZOLE 40 MG PO CPDR: 40.0000 mg | DELAYED_RELEASE_CAPSULE | Freq: Two times a day (BID) | ORAL | 2 refills | Status: DC

## 2019-04-08 MED ORDER — DICYCLOMINE HCL 10 MG PO CAPS: 10.0000 mg | ORAL_CAPSULE | Freq: Three times a day (TID) | ORAL | 0 refills | Status: DC

## 2019-04-08 NOTE — Telephone Encounter (Signed)
Referred patient to [facility].

## 2019-04-08 NOTE — Progress Notes (Signed)
Sherri Sear, MD [address]  [place]  [place], [place] [address]  Main: [phone]  Fax: [phone]    Gastroenterology Consultation Video Visit  Referring Provider:     Steele Sizer, MD Primary Care Physician:  Steele Sizer, MD Primary Gastroenterologist:  Dr. Bonna Gains Reason for Consultation:     Recent ER visit, GI bleed        HPI:   SELMA MINK is a 49 y.o. female referred by Dr. Steele Sizer, MD  for consultation & management of recent ER visit, GI bleed  Virtual Visit Video Note  I connected with BRALEE FELDT on 04/08/19 at  9:30 AM EST by video and verified that I am speaking with the correct person using two identifiers.   I discussed the limitations, risks, security and privacy concerns of performing an evaluation and management service by video and the availability of in person appointments. I also discussed with the patient that there may be a patient responsible charge related to this service. The patient expressed understanding and agreed to proceed.  Location of the Patient: Home  Location of the provider: Office  Persons participating in the visit: Patient and provider only   History of Present Illness: Ms. Mcculley is a 49 year old female with history of gastric sleeve, diabetes, reflux, status post Nissen fundoplication.  Patient went to ER due to rectal bleeding and she noticed her stool was black, associated with vomiting.  She also experienced right upper quadrant/epigastric pain. She has been taking Protonix 40 mg every other day.  In the ER, she had normal hemoglobin, lipase, CMP.  She underwent CT abdomen and pelvis with contrast which did not reveal any acute intra-abdominal pathology. She also reports increased bowel frequency, postprandial urgency as well as abdominal cramps about 4 times a day.  She was tested for microscopic colitis, IBD, celiac disease, H. pylori infection in the past which were all negative.     She reports that for the last 2 weeks, she has been experiencing severe reflux.  She was previously on Prevacid.  Currently on Protonix every other day.  Patient was referred to [facility] surgery for redo fundoplication.  She was seen by Dr. Hassell Done for recurrent periumbilical abdominal pain as well as hernia repair, apparently underwent Laparoscopy; enterolysis; cecopexy, closure of periumbilical eventration only.  NSAIDs: None  Antiplts/Anticoagulants/Anti thrombotics: None  GI Procedures:  EGD 01/28/2018 by Dr. Bonna Gains  - Salmon-colored mucosa suspicious for short-segment Barrett's esophagus. Biopsied. - A sleeve gastrectomy was found, characterized by healthy appearing mucosa. Biopsied. - Medium-sized hiatal hernia. - A Nissen fundoplication was found. The wrap appears not intact. - Normal examined duodenum.    Colonoscopy 01/28/2018 by Dr. Bonna Gains - The rectum, sigmoid colon, descending colon, transverse colon, ascending colon and cecum are normal. - The distal rectum and anal verge are normal on retroflexion view. - No specimens collected.  Past Medical History:  Diagnosis Date  . Anemia   . Anxiety   . Arthritis    hands, hips  . Asthma   . Bilateral leg cramps   . Depression   . Diabetes mellitus without complication ([place])    history no longer a problem since weight loss surgery  . DJD (degenerative joint disease)   . GERD (gastroesophageal reflux disease)   . Headache    Migraines  . IBS (irritable bowel syndrome)   . Kidney stone   . Low serum vitamin B12   . Lung nodules   .  Migraine    down to approx 4x/mo since starting meds  . Muscle spasm   . Psoriasis    vaginal area  . Seasonal allergies   . Sleep apnea    history no longer a problem since weight loss surgery  . Vitamin D deficiency     Past Surgical History:  Procedure Laterality Date  . BLADDER SURGERY  2010  . BREAST BIOPSY Right 2014   stereotatic biopsy  . CHOLECYSTECTOMY   2010  . COLONOSCOPY  2014    Done at [facility]  . COLONOSCOPY WITH PROPOFOL N/A 01/28/2018   Procedure: COLONOSCOPY WITH PROPOFOL;  Surgeon: Virgel Manifold, MD;  Location: [facility];  Service: Endoscopy;  Laterality: N/A;  . DILITATION & CURRETTAGE/HYSTROSCOPY WITH NOVASURE ABLATION N/A 06/16/2015   Procedure: DILATATION & CURETTAGE/HYSTEROSCOPY WITH NOVASURE ABLATION;  Surgeon: Brien Few, MD;  Location: [facility];  Service: Gynecology;  Laterality: N/A;  . ESOPHAGOGASTRODUODENOSCOPY (EGD) WITH PROPOFOL N/A 01/28/2018   Procedure: ESOPHAGOGASTRODUODENOSCOPY (EGD) WITH BIOPSIES;  Surgeon: Virgel Manifold, MD;  Location: [facility];  Service: Endoscopy;  Laterality: N/A;  . [place] RESECTION  2012  . LAPAROSCOPY N/A 06/16/2015   Procedure: LAPAROSCOPY DIAGNOSTIC;  Surgeon: Brien Few, MD;  Location: [facility];  Service: Gynecology;  Laterality: N/A;  . LAPAROSCOPY N/A 04/01/2018   Procedure: LAPAROSCOPY DIAGNOSTIC ERAS PATHWAY ENTEROLYSIS, CECOPEXY;  Surgeon: Johnathan Hausen, MD;  Location: [facility];  Service: General;  Laterality: N/A;  . LITHOTRIPSY  12/11/2017   laser lithotripsy  . LYSIS OF ADHESION N/A 06/16/2015   Procedure: LYSIS OF ADHESION;  Surgeon: Brien Few, MD;  Location: [facility];  Service: Gynecology;  Laterality: N/A;  . PLANTAR FASCIA SURGERY Bilateral   . ROBOTIC ASSISTED SALPINGO OOPHERECTOMY Right 06/16/2015   Procedure: ROBOTIC ASSISTED SALPINGO OOPHORECTOMY, EXCISION OF RIGHT CUL DE [place] MASS;  Surgeon: Brien Few, MD;  Location: [facility];  Service: Gynecology;  Laterality: Right;  . TONSILLECTOMY    . UPPER GI ENDOSCOPY      Current Outpatient Medications:  .  Accu-Chek FastClix Lancets MISC, USE TO TEST UP TO 4 TIMES A DAY, Disp: , Rfl:  .  ACCU-CHEK GUIDE test strip, , Disp: , Rfl:  .  albuterol (VENTOLIN HFA) 108 (90 Base) MCG/ACT inhaler, TAKE 2 PUFFS EVERY 6 HOURS AS NEEDED FOR WHEEZING OR SHORTNESS OF BREATH.  (VENTOLIN NOT COVERED), Disp: 8.5 g, Rfl: 2 .  Ascorbic Acid (VITAMIN C) 1000 MG tablet, Take 1,000 mg by mouth., Disp: , Rfl:  .  aspirin EC 81 MG tablet, Take 81 mg by mouth daily., Disp: , Rfl:  .  atorvastatin (LIPITOR) 10 MG tablet, TAKE 1 TABLET BY MOUTH EVERY DAY, Disp: 90 tablet, Rfl: 0 .  blood glucose meter kit and supplies, Dispense based on patient and insurance preference. Use up to four times daily as directed. (FOR ICD-10 E10.9, E11.9)., Disp: 1 each, Rfl: 2 .  calcium carbonate (OS-CAL) 600 MG TABS tablet, Take 600 mg by mouth daily with breakfast., Disp: , Rfl:  .  clobetasol ointment (TEMOVATE) 8.14 %, Apply 1 application topically daily as needed (psoriasis). , Disp: , Rfl: 0 .  cyanocobalamin (,VITAMIN B-12,) 1000 MCG/ML injection, Inject 1 mL (1,000 mcg total) into the muscle once a week., Disp: 4 mL, Rfl: 2 .  cyclobenzaprine (FLEXERIL) 10 MG tablet, Take 1 tablet (10 mg total) by mouth at bedtime. Prn (Patient taking differently: Take 10 mg by mouth at bedtime as needed (migraine). ), Disp:  90 tablet, Rfl: 1 .  fluconazole (DIFLUCAN) 150 MG tablet, Take 1 tablet (150 mg total) by mouth every other day., Disp: 9 tablet, Rfl: 2 .  fluocinonide (LIDEX) 0.05 % external solution, APPLY TO AFFECTED SCALP TWICE DAILY UNTIL CLEAR, THEN AS NEEDED, Disp: , Rfl:  .  gentamicin ointment (GARAMYCIN) 0.1 %, Apply 1 application topically daily as needed (nasal dryness). , Disp: , Rfl: 0 .  ketoconazole (NIZORAL) 2 % shampoo, PLEASE SEE ATTACHED FOR DETAILED DIRECTIONS, Disp: , Rfl:  .  Lactobacillus (ACIDOPHILUS PO), Take 1 tablet by mouth daily., Disp: , Rfl:  .  loratadine (CLARITIN) 10 MG tablet, Take 10 mg by mouth daily., Disp: , Rfl:  .  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 , Disp: , Rfl: 4 .  mometasone (NASONEX) 50 MCG/ACT nasal spray, USE 2 SPRAYS IN EACH NOSTRIL AT BEDTIME AS NEEDED FOR ALLERGIES, Disp: 17 g, Rfl:  1 .  montelukast (SINGULAIR) 10 MG tablet, Take 1 tablet (10 mg total) by mouth at bedtime. TAKE 1 TABLET(10 MG) BY MOUTH AT BEDTIME, Disp: 90 tablet, Rfl: 1 .  mupirocin ointment (BACTROBAN) 2 %, mupirocin 2 % topical ointment, Disp: , Rfl:  .  pantoprazole (PROTONIX) 40 MG tablet, TAKE 1 TABLET BY MOUTH EVERY DAY, Disp: 90 tablet, Rfl: 1 .  promethazine (PHENERGAN) 12.5 MG tablet, TAKE 1 TABLET BY MOUTH EVERY 6 HOURS AS NEEDED FOR MIGRAINES WITH NAUSEA, Disp: 20 tablet, Rfl: 0 .  Semaglutide,0.25 or 0.5MG/DOS, (OZEMPIC, 0.25 OR 0.5 MG/DOSE,) 2 MG/1.5ML SOPN, Inject 0.5 mg into the skin once a week., Disp: 2 pen, Rfl: 0 .  SYRINGE-NEEDLE, DISP, 3 ML (SAFETY SYRINGES/NEEDLE) 20G X 1-1/2" 3 ML MISC, 1 each by Does not apply route every 30 (thirty) days., Disp: 50 each, Rfl: 0 .  traMADol (ULTRAM) 50 MG tablet, Take 1 tablet (50 mg total) by mouth every 6 (six) hours as needed., Disp: 20 tablet, Rfl: 0 .  traZODone (DESYREL) 100 MG tablet, Take 100-200 mg by mouth at bedtime as needed., Disp: , Rfl:  .  TRINTELLIX 10 MG TABS tablet, Take 10 mg by mouth every morning., Disp: , Rfl:  .  Vitamin D, Ergocalciferol, (DRISDOL) 1.25 MG (50000 UT) CAPS capsule, TAKE 1 CAPSULE (50,000 UNITS TOTAL) BY MOUTH EVERY 7 (SEVEN) DAYS. TAKE 1 CAPSULE EVERY 7 DAYS, Disp: 12 capsule, Rfl: 1 .  zonisamide (ZONEGRAN) 50 MG capsule, Take 150 capsules by mouth daily. , Disp: , Rfl: 2 .  dicyclomine (BENTYL) 10 MG capsule, Take 1 capsule (10 mg total) by mouth 4 (four) times daily -  before meals and at bedtime., Disp: 90 capsule, Rfl: 0 .  omeprazole (PRILOSEC) 40 MG capsule, Take 1 capsule (40 mg total) by mouth 2 (two) times daily before a meal., Disp: 60 capsule, Rfl: 2    Family History  Problem Relation Age of Onset  . Heart attack Mother   . Stroke Mother   . Multiple myeloma Mother   . Hyperlipidemia Father   . Heart attack Sister   . Heart attack Maternal Uncle   . Heart attack Paternal Grandfather   .  Breast cancer Paternal Aunt   . Breast cancer Paternal Grandmother   . Lung cancer Maternal Grandfather      Social History   Tobacco Use  . Smoking status: Former Smoker    Packs/day: 1.00    Years: 10.00    Pack years: 10.00  Types: Cigarettes    Quit date: 02/19/1993    Years since quitting: 26.1  . Smokeless tobacco: Never Used  Substance Use Topics  . Alcohol use: Yes    Alcohol/week: 0.0 standard drinks    Comment: occasionally  . Drug use: No    Allergies as of 04/08/2019 - Review Complete 04/08/2019  Allergen Reaction Noted  . Gabapentin  05/16/2016  . Linzess [linaclotide]  11/11/2015  . Other Hives and Other (See Comments) 06/30/2012  . Adhesive [tape] Rash 01/27/2018  . Cymbalta [duloxetine hcl] Palpitations 09/24/2014  . Tapentadol Rash 01/27/2018    Imaging Studies: Reviewed  Assessment and Plan:   SOFFIA DOSHIER is a 49 y.o. female with diabetes, history of GERD s/p Nissen's fundoplication, gastric sleeve is seen as an urgent visit from recent ER visit for black stools and vomiting.  Work-up was unremarkable, no evidence of anemia.  Reassured the patient.  She is mostly concerned about worsening of reflux  Exacerbation of GERD, status post Nissen fundoplication in 1117, loose wrap Discussed about antireflux lifestyle Switch from pantoprazole to omeprazole 40 mg 2 times a day before meals She was referred to [place] [facility] in the past for hernia repair, however this was not pursued.  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 pylori infection, IBD, microscopic colitis Recommend trial of Bentyl 10 mg 3 times daily before each meal and at bedtime as needed   Follow Up Instructions:   I discussed the assessment and treatment plan with the patient. The patient was provided an opportunity to  ask questions and all were answered. The patient agreed with the plan and demonstrated an understanding of the instructions.   The patient was advised to call back or seek an in-person evaluation if the symptoms worsen or if the condition fails to improve as anticipated.  I provided 15 minutes of face-to-face time during this encounter.   Follow up in 1 month with Dr. Yevette Edwards, MD

## 2019-04-08 NOTE — Telephone Encounter (Signed)
-----   Message from Lin Landsman, MD sent at 04/08/2019  1:09 PM EST ----- Regarding: Referral Please refer her to [facility] surgery to evaluate for hernia repairDiagnosis, chronic GERD, history of Nissen fundoplication, loose wrap  Thanks RV

## 2019-04-14 ENCOUNTER — Ambulatory Visit: Payer: BC Managed Care – PPO | Admitting: Family Medicine

## 2019-04-17 ENCOUNTER — Other Ambulatory Visit: Payer: Self-pay | Admitting: Family Medicine

## 2019-04-17 DIAGNOSIS — J3089 Other allergic rhinitis: Secondary | ICD-10-CM

## 2019-04-17 DIAGNOSIS — E538 Deficiency of other specified B group vitamins: Secondary | ICD-10-CM

## 2019-04-17 NOTE — Telephone Encounter (Signed)
Requested medications are due for refill today?  Medication not assigned to a protocol.    Requested medications are on active medication list? Yes  Last Refill:   01/07/2019 # 4 ml with 2 refills   Future visit scheduled?  Yes  Notes to Clinic:

## 2019-04-20 ENCOUNTER — Encounter: Payer: Self-pay | Admitting: Family Medicine

## 2019-04-20 ENCOUNTER — Other Ambulatory Visit: Payer: Self-pay

## 2019-04-20 ENCOUNTER — Ambulatory Visit: Payer: 59 | Admitting: Family Medicine

## 2019-04-20 VITALS — BP 110/70 | HR 86 | Temp 97.5°F | Resp 16 | Ht 61.0 in | Wt 184.3 lb

## 2019-04-20 DIAGNOSIS — E119 Type 2 diabetes mellitus without complications: Secondary | ICD-10-CM

## 2019-04-20 DIAGNOSIS — E538 Deficiency of other specified B group vitamins: Secondary | ICD-10-CM

## 2019-04-20 DIAGNOSIS — L659 Nonscarring hair loss, unspecified: Secondary | ICD-10-CM

## 2019-04-20 DIAGNOSIS — J4530 Mild persistent asthma, uncomplicated: Secondary | ICD-10-CM

## 2019-04-20 DIAGNOSIS — D72829 Elevated white blood cell count, unspecified: Secondary | ICD-10-CM | POA: Diagnosis not present

## 2019-04-20 DIAGNOSIS — K589 Irritable bowel syndrome without diarrhea: Secondary | ICD-10-CM

## 2019-04-20 DIAGNOSIS — R42 Dizziness and giddiness: Secondary | ICD-10-CM

## 2019-04-20 DIAGNOSIS — R635 Abnormal weight gain: Secondary | ICD-10-CM

## 2019-04-20 DIAGNOSIS — K219 Gastro-esophageal reflux disease without esophagitis: Secondary | ICD-10-CM

## 2019-04-20 DIAGNOSIS — R221 Localized swelling, mass and lump, neck: Secondary | ICD-10-CM

## 2019-04-20 DIAGNOSIS — I700 Atherosclerosis of aorta: Secondary | ICD-10-CM | POA: Diagnosis not present

## 2019-04-20 LAB — POCT GLYCOSYLATED HEMOGLOBIN (HGB A1C): Hemoglobin A1C: 5.1 % (ref 4.0–5.6)

## 2019-04-20 MED ORDER — ATORVASTATIN CALCIUM 10 MG PO TABS: 10.0000 mg | ORAL_TABLET | Freq: Every day | ORAL | 1 refills | Status: DC

## 2019-04-20 MED ORDER — CYANOCOBALAMIN 1000 MCG/ML IJ SOLN: 1000.0000 ug | INTRAMUSCULAR | 2 refills | Status: DC

## 2019-04-20 NOTE — Progress Notes (Addendum)
Name: Claire [name]   MRN: [id number]    DOB: 06-Feb-1971   Date:04/20/2019       Progress Note  Subjective  Chief Complaint  Chief Complaint  Patient presents with  . Diabetes  . Migraine  . Depression  . Anxiety  . Anemia  . Medication Refill    4 month F/U  . Neck Pain    Right side of her neck is painful and has swelling, tender to touch. Wants to have TSH levels checked.    HPI  Major Depression:she is now seeing Dr. Nicolasa Ducking who is also giving her therapy.  She is off Zolot and is currently on Trintelix 10 mg and off Lunesta and taking Trazodone for sleep ( still has some night sweats but sleeping well) .  She had side effects with Ambien ( interrupted sleep and sleep walking) , or Temazepam ( groggy ). Kids are getting ready to go back to school twice a week next week. Her two sons have been diagnosed with depression and are taking medications now.  . Asthma MildIntermittentt: she has been using albuterol more often because the mask makes her feel suffocated. She states wearing a mask makes her use inhaler more often, discussed taking a break and taking deep breaths before using inhaler.   Migraine: she is still seeing Dr. Domingo Cocking - she is on  Zonegran. She is doing better now, last month she had one long episode that lasted almost one week and had to take steroids but otherwise doing well.   DMII: diagnosed at age 39, took medication for a while, but stopped all medications 10/25/2010 after bariatric surgery,glucose at home has been well controlled, no polyphagia, polyuria or polydipsia. Maximum weight of 265 lbs and wasdown to 118.3lbs - lowest weight back in 08/2017),she is now gaining weight since she is feeling better after umbilical hernia repair, lysis of adhesions and cecopexy back in 04/01/2018. Weight is up to 184 lbs  She is back on Ozempic but not working very well   IBS/GERD history of bypass surgery:  She was doing well after  lysis of adhesions and  cecopexy back in 04/01/2018. However recently had to go back to [facility] with severe abdominal pain, she has since seen GI and was switched from Pantoprazole to Omeprazole and was given Bentyl o take prn before meals. She was advised to see surgeon to see if she needs hiatal hernia repair and will see Dr. Hassell Done   Atherosclerosis of aorta and hyperlipidemia: on statin therapy, no side effects, no chest pain or palpitation. No side effects   B12 deficiency: she has been getting B12 injections weekly since Nov and we will recheck level today if still low we will refer her to hematologist, if high we will go down to every other week.   Leucocytosis during recent visit to [facility] we will recheck level today   Weight gain and hair loss: she would like to have TSH checked   Dizziness/syncope: seen at [facility], and has seen Dr. Margarito Courser 03/2019 , they ruled out POTS but may have had vasovagal syncope, advised to do exercises and increase salt intake   Neck mass: she noticed a lump that is tender on right side of her neck , no increase in warmth, it hurts when she touches and swallows  Patient Active Problem List   Diagnosis Date Noted  . B12 deficiency 04/10/2018  . Columnar epithelial-lined lower esophagus   . Postprocedural disorder of digestive system   .  Pain, abdominal, epigastric   . Esophageal dysphagia   . Generalized abdominal pain   . Persistent mood (affective) disorder, unspecified ([place]) 01/15/2018  . Insomnia related to another mental disorder 01/15/2018  . OCD (obsessive compulsive disorder) 10/29/2017  . GAD (generalized anxiety disorder) 10/29/2017  . Nephrolithiasis 10/22/2017  . Atherosclerosis of aorta ([place]) 12/21/2016  . Sacroiliac inflammation ([place]) 11/19/2016  . Hiatal hernia 08/17/2016  . Diverticulosis of colon 08/17/2016  . Iron deficiency anemia 08/08/2015  . Lumbosacral pain 01/07/2015  . Muscle twitching 01/07/2015  . Intertrigo 11/05/2014  . Perennial allergic rhinitis  09/03/2014  . Lung nodules 09/03/2014  . Vitamin D deficiency 09/03/2014  . Diet-controlled diabetes mellitus ([place]) 09/03/2014  . History of kidney stones 09/03/2014  . Chronic insomnia 09/03/2014  . History of iron deficiency anemia 09/03/2014  . Depression with anxiety 09/03/2014  . Palpitations 06/22/2014  . Bariatric surgery status 12/03/2013  . Asthma, mild persistent 11/21/2012  . CN (constipation) 11/21/2012  . GERD without esophagitis 11/21/2012  . History of hyperlipidemia 11/21/2012  . Migraine with aura and without status migrainosus 11/21/2012  . History of sleep apnea 11/21/2012  . IBS (irritable bowel syndrome) 08/15/2012    Past Surgical History:  Procedure Laterality Date  . BLADDER SURGERY  2010  . BREAST BIOPSY Right 2014   stereotatic biopsy  . CHOLECYSTECTOMY  2010  . COLONOSCOPY  2014    [facility] at [facility]  . COLONOSCOPY WITH PROPOFOL N/A 01/28/2018   Procedure: COLONOSCOPY WITH PROPOFOL;  Surgeon: Virgel Manifold, MD;  Location: [place];  Service: Endoscopy;  Laterality: N/A;  . DILITATION & CURRETTAGE/HYSTROSCOPY WITH NOVASURE ABLATION N/A 06/16/2015   Procedure: DILATATION & CURETTAGE/HYSTEROSCOPY WITH NOVASURE ABLATION;  Surgeon: Brien Few, MD;  Location: [facility];  Service: Gynecology;  Laterality: N/A;  . ESOPHAGOGASTRODUODENOSCOPY (EGD) WITH PROPOFOL N/A 01/28/2018   Procedure: ESOPHAGOGASTRODUODENOSCOPY (EGD) WITH BIOPSIES;  Surgeon: Virgel Manifold, MD;  Location: [place];  Service: Endoscopy;  Laterality: N/A;  . [place] RESECTION  2012  . LAPAROSCOPY N/A 06/16/2015   Procedure: LAPAROSCOPY DIAGNOSTIC;  Surgeon: Brien Few, MD;  Location: [facility] [facility];  Service: Gynecology;  Laterality: N/A;  . LAPAROSCOPY N/A 04/01/2018   Procedure: LAPAROSCOPY DIAGNOSTIC ERAS PATHWAY ENTEROLYSIS, CECOPEXY;  Surgeon: Johnathan Hausen, MD;  Location: [facility];  Service: General;  Laterality: N/A;  . LITHOTRIPSY   12/11/2017   laser lithotripsy  . LYSIS OF ADHESION N/A 06/16/2015   Procedure: LYSIS OF ADHESION;  Surgeon: Brien Few, MD;  Location: [facility];  Service: Gynecology;  Laterality: N/A;  . PLANTAR FASCIA SURGERY Bilateral   . ROBOTIC ASSISTED SALPINGO OOPHERECTOMY Right 06/16/2015   Procedure: ROBOTIC ASSISTED SALPINGO OOPHORECTOMY, EXCISION OF RIGHT CUL DE [place] MASS;  Surgeon: Brien Few, MD;  Location: [facility] [facility];  Service: Gynecology;  Laterality: Right;  . TONSILLECTOMY    . UPPER GI ENDOSCOPY      Family History  Problem Relation Age of Onset  . Heart attack Mother   . Stroke Mother   . Multiple myeloma Mother   . Hyperlipidemia Father   . Heart attack Sister   . Heart attack Maternal Uncle   . Heart attack Paternal Grandfather   . Breast cancer Paternal Aunt   . Breast cancer Paternal Grandmother   . Lung cancer Maternal Grandfather     Social History   Tobacco Use  . Smoking status: Former Smoker    Packs/day: 1.00    Years: 10.00    Pack  years: 10.00    Types: Cigarettes    Quit date: 02/19/1993    Years since quitting: 26.1  . Smokeless tobacco: Never Used  Substance Use Topics  . Alcohol use: Yes    Alcohol/week: 0.0 standard drinks    Comment: occasionally     Current Outpatient Medications:  .  Accu-Chek FastClix Lancets MISC, USE TO TEST UP TO 4 TIMES A DAY, Disp: , Rfl:  .  ACCU-CHEK GUIDE test strip, , Disp: , Rfl:  .  albuterol (VENTOLIN HFA) 108 (90 Base) MCG/ACT inhaler, TAKE 2 PUFFS EVERY 6 HOURS AS NEEDED FOR WHEEZING OR SHORTNESS OF BREATH. (VENTOLIN NOT COVERED), Disp: 8.5 g, Rfl: 2 .  Ascorbic Acid (VITAMIN C) 1000 MG tablet, Take 1,000 mg by mouth., Disp: , Rfl:  .  aspirin EC 81 MG tablet, Take 81 mg by mouth daily., Disp: , Rfl:  .  atorvastatin (LIPITOR) 10 MG tablet, Take 1 tablet (10 mg total) by mouth daily., Disp: 90 tablet, Rfl: 1 .  blood glucose meter kit and supplies, Dispense based on patient and insurance preference. Use up to four  times daily as directed. (FOR ICD-10 E10.9, E11.9)., Disp: 1 each, Rfl: 2 .  calcium carbonate (OS-CAL) 600 MG TABS tablet, Take 600 mg by mouth daily with breakfast., Disp: , Rfl:  .  clobetasol ointment (TEMOVATE) 7.41 %, Apply 1 application topically daily as needed (psoriasis). , Disp: , Rfl: 0 .  cyanocobalamin (,VITAMIN B-12,) 1000 MCG/ML injection, Inject 1 mL (1,000 mcg total) into the muscle every 14 (fourteen) days., Disp: 2 mL, Rfl: 2 .  cyclobenzaprine (FLEXERIL) 10 MG tablet, Take 1 tablet (10 mg total) by mouth at bedtime. Prn (Patient taking differently: Take 10 mg by mouth at bedtime as needed (migraine). ), Disp: 90 tablet, Rfl: 1 .  dicyclomine (BENTYL) 10 MG capsule, Take 1 capsule (10 mg total) by mouth 4 (four) times daily -  before meals and at bedtime., Disp: 90 capsule, Rfl: 0 .  fluconazole (DIFLUCAN) 150 MG tablet, Take 1 tablet (150 mg total) by mouth every other day., Disp: 9 tablet, Rfl: 2 .  fluocinonide (LIDEX) 0.05 % external solution, APPLY TO AFFECTED SCALP TWICE DAILY UNTIL CLEAR, THEN AS NEEDED, Disp: , Rfl:  .  ketoconazole (NIZORAL) 2 % shampoo, PLEASE SEE ATTACHED FOR DETAILED DIRECTIONS, Disp: , Rfl:  .  Lactobacillus (ACIDOPHILUS PO), Take 1 tablet by mouth daily., Disp: , Rfl:  .  loratadine (CLARITIN) 10 MG tablet, Take 10 mg by mouth daily., Disp: , Rfl:  .  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 , Disp: , Rfl: 4 .  mometasone (NASONEX) 50 MCG/ACT nasal spray, USE 2 SPRAYS IN EACH NOSTRIL AT BEDTIME AS NEEDED FOR ALLERGIES, Disp: 17 g, Rfl: 1 .  montelukast (SINGULAIR) 10 MG tablet, Take 1 tablet (10 mg total) by mouth at bedtime. TAKE 1 TABLET(10 MG) BY MOUTH AT BEDTIME, Disp: 90 tablet, Rfl: 1 .  omeprazole (PRILOSEC) 40 MG capsule, Take 1 capsule (40 mg total) by mouth 2 (two) times daily before a meal., Disp: 60 capsule, Rfl: 2 .  promethazine (PHENERGAN) 12.5 MG tablet, TAKE 1 TABLET BY  MOUTH EVERY 6 HOURS AS NEEDED FOR MIGRAINES WITH NAUSEA, Disp: 20 tablet, Rfl: 0 .  Semaglutide,0.25 or 0.5MG/DOS, (OZEMPIC, 0.25 OR 0.5 MG/DOSE,) 2 MG/1.5ML SOPN, Inject 0.5 mg into the skin once a week., Disp: 2 pen, Rfl: 0 .  SYRINGE-NEEDLE, DISP, 3  ML (SAFETY SYRINGES/NEEDLE) 20G X 1-1/2" 3 ML MISC, 1 each by Does not apply route every 30 (thirty) days., Disp: 50 each, Rfl: 0 .  traZODone (DESYREL) 100 MG tablet, Take 100-200 mg by mouth at bedtime as needed., Disp: , Rfl:  .  TRINTELLIX 10 MG TABS tablet, Take 10 mg by mouth every morning., Disp: , Rfl:  .  Vitamin D, Ergocalciferol, (DRISDOL) 1.25 MG (50000 UT) CAPS capsule, TAKE 1 CAPSULE (50,000 UNITS TOTAL) BY MOUTH EVERY 7 (SEVEN) DAYS. TAKE 1 CAPSULE EVERY 7 DAYS, Disp: 12 capsule, Rfl: 1 .  zonisamide (ZONEGRAN) 50 MG capsule, Take 150 capsules by mouth daily. , Disp: , Rfl: 2  Allergies  Allergen Reactions  . Gabapentin     Bladder incontinence at higher dose  . Linzess [Linaclotide]     Worsening of diarrhea  . Other Hives and Other (See Comments)    Ricotta Cheese: flushed and hot  . Adhesive [Tape] Rash    Some clear tapes  . Cymbalta [Duloxetine Hcl] Palpitations    And photosensitivity  . Tapentadol Rash    Some clear tapes    I personally reviewed active problem list, medication list, allergies, family history, social history, health maintenance with the patient/caregiver today.   ROS  Constitutional: Negative for fever or weight change.  Respiratory: Negative for cough , positive for  shortness of breath with activity .   Cardiovascular: Negative for chest pain or palpitations.  Gastrointestinal: Positive  for abdominal pain and intermittent bowel changes.  Musculoskeletal: Negative for gait problem or joint swelling.  Skin: Negative for rash.  Neurological: Negative for dizziness , positive for intermittent  headache.  No other specific complaints in a complete review of systems (except as listed in HPI  above).  Objective  Vitals:   04/20/19 0853  BP: 110/70  Pulse: 86  Resp: 16  Temp: (!) 97.5 F (36.4 C)  TempSrc: Temporal  SpO2: 97%  Weight: 184 lb 4.8 oz (83.6 kg)  Height: 5' 1"  (1.549 m)    Body mass index is 34.82 kg/m.  Physical Exam  Constitutional: Patient appears well-developed and well-nourished. Obese No distress.  HEENT: head atraumatic, normocephalic, pupils equal and reactive to light. Neck, thyroid seems normal, but there is a tender mass/on right side of neck  Cardiovascular: Normal rate, regular rhythm and normal heart sounds.  No murmur heard. No BLE edema. Pulmonary/Chest: Effort normal and breath sounds normal. No respiratory distress. Abdominal: Soft.  There is no tenderness. Psychiatric: Patient has a normal mood and affect. behavior is normal. Judgment and thought content normal.  Diabetic Foot Exam - Simple   Simple Foot Form Diabetic Foot exam was performed with the following findings: Yes 04/20/2019  9:20 AM  Visual Inspection No deformities, no ulcerations, no other skin breakdown bilaterally: Yes Sensation Testing Intact to touch and monofilament testing bilaterally: Yes Pulse Check Posterior Tibialis and Dorsalis pulse intact bilaterally: Yes Comments    Recent Results (from the past 2160 hour(s))  Lipase, blood     Status: None   Collection Time: 04/02/19  4:40 PM  Result Value Ref Range   Lipase 30 11 - 51 U/L    Comment: Performed at [facility], [address] [place], [place] [address]  Comprehensive metabolic panel     Status: Abnormal   Collection Time: 04/02/19  4:40 PM  Result Value Ref Range   Sodium 137 135 - 145 mmol/L   Potassium 3.9 3.5 - 5.1 mmol/L  Chloride 105 98 - 111 mmol/L   CO2 25 22 - 32 mmol/L   Glucose, Bld 105 (H) 70 - 99 mg/dL   BUN 12 6 - 20 mg/dL   Creatinine, Ser 0.71 0.44 - 1.00 mg/dL   Calcium 8.7 (L) 8.9 - 10.3 mg/dL   Total Protein 6.7 6.5 - 8.1 g/dL   Albumin 3.6 3.5 - 5.0 g/dL    AST 16 15 - 41 U/L   ALT 21 0 - 44 U/L   Alkaline Phosphatase 72 38 - 126 U/L   Total Bilirubin 0.5 0.3 - 1.2 mg/dL   GFR calc non Af Amer >60 >60 mL/min   GFR calc Af Amer >60 >60 mL/min   Anion gap 7 5 - 15    Comment: Performed at [facility], [address] [place], [place] [address]  CBC     Status: Abnormal   Collection Time: 04/02/19  4:40 PM  Result Value Ref Range   WBC 11.5 (H) 4.0 - 10.5 K/uL   RBC 4.58 3.87 - 5.11 MIL/uL   Hemoglobin 13.3 12.0 - 15.0 g/dL   HCT 41.6 36.0 - 46.0 %   MCV 90.8 80.0 - 100.0 fL   MCH 29.0 26.0 - 34.0 pg   MCHC 32.0 30.0 - 36.0 g/dL   RDW 13.2 11.5 - 15.5 %   Platelets 350 150 - 400 K/uL   nRBC 0.0 0.0 - 0.2 %    Comment: Performed at [facility], [address] [place], [place] [address]  Hemoglobin and hematocrit, blood     Status: None   Collection Time: 04/03/19  3:32 AM  Result Value Ref Range   Hemoglobin 13.2 12.0 - 15.0 g/dL   HCT 40.8 36.0 - 46.0 %    Comment: Performed at [facility], [address] [place], [place] [address]  Urinalysis, Complete w Microscopic     Status: Abnormal   Collection Time: 04/03/19  5:18 AM  Result Value Ref Range   Color, Urine COLORLESS (A) YELLOW   APPearance CLEAR (A) CLEAR   Specific Gravity, Urine 1.010 1.005 - 1.030   pH 6.0 5.0 - 8.0   Glucose, UA NEGATIVE NEGATIVE mg/dL   Hgb urine dipstick SMALL (A) NEGATIVE   Bilirubin Urine NEGATIVE NEGATIVE   Ketones, ur NEGATIVE NEGATIVE mg/dL   Protein, ur NEGATIVE NEGATIVE mg/dL   Nitrite NEGATIVE NEGATIVE   Leukocytes,Ua NEGATIVE NEGATIVE   WBC, UA 0-5 0 - 5 WBC/hpf   Bacteria, UA NONE SEEN NONE SEEN   Squamous Epithelial / LPF 0-5 0 - 5    Comment: Performed at [facility], [address] [place], [place] [address]  POC SARS Coronavirus 2 Ag     Status: None   Collection Time: 04/03/19  6:08 AM  Result Value Ref Range   SARS Coronavirus 2 Ag NEGATIVE NEGATIVE    Comment: (NOTE) SARS-CoV-2  antigen NOT DETECTED.  Negative results are presumptive.  Negative results do not preclude SARS-CoV-2 infection and should not be used as the sole basis for treatment or other patient management decisions, including infection  control decisions, particularly in the presence of clinical signs and  symptoms consistent with COVID-19, or in those who have been in contact with the virus.  Negative results must be combined with clinical observations, patient history, and epidemiological information. The expected result is Negative. Fact Sheet for Patients: PodPark.tn Fact Sheet for Healthcare Providers: GiftContent.is This test is not yet approved or cleared by  the [employer] and  has been authorized for detection and/or diagnosis of SARS-CoV-2 by FDA under an Emergency Use Authorization (EUA).  This EUA will remain in effect (meaning this test can be used) for the duration of  the COVID-19 de claration under Section 564(b)(1) of the Act, 21 U.S.C. section 360bbb-3(b)(1), unless the authorization is terminated or revoked sooner.   POCT HgB A1C     Status: Normal   Collection Time: 04/20/19  9:07 AM  Result Value Ref Range   Hemoglobin A1C 5.1 4.0 - 5.6 %   HbA1c POC (<> result, manual entry)     HbA1c, POC (prediabetic range)     HbA1c, POC (controlled diabetic range)       PHQ2/9: Depression screen [facility] 2/9 04/20/2019 01/07/2019 12/12/2018 06/11/2018 03/11/2018  Decreased Interest 0 0 1 0 0  Down, Depressed, Hopeless 0 0 1 1 0  PHQ - 2 Score 0 0 2 1 0  Altered sleeping 1 0 1 - 0  Tired, decreased energy 1 1 1  - 1  Change in appetite 0 0 1 - 1  Feeling bad or failure about yourself  0 0 0 - 0  Trouble concentrating 1 1 3  - 0  Moving slowly or fidgety/restless 0 0 0 - 0  Suicidal thoughts 0 0 0 - 0  PHQ-9 Score 3 2 8  - 2  Difficult doing work/chores Somewhat difficult Somewhat difficult - - Not difficult at all  Some  recent data might be hidden    phq 9 is positive   Fall Risk: Fall Risk  04/20/2019 01/07/2019 12/12/2018 06/11/2018 03/11/2018  Falls in the past year? 0 0 0 0 0  Number falls in past yr: 0 0 0 0 0  Injury with Fall? 0 0 0 0 0     Functional Status Survey: Is the patient deaf or have difficulty hearing?: No Does the patient have difficulty seeing, even when wearing glasses/contacts?: No Does the patient have difficulty concentrating, remembering, or making decisions?: No Does the patient have difficulty walking or climbing stairs?: No Does the patient have difficulty dressing or bathing?: No Does the patient have difficulty doing errands alone such as visiting a doctor's office or shopping?: No    Assessment & Plan  1. Diet-controlled diabetes mellitus ([place])  - POCT HgB A1C  2. Leukocytosis, unspecified type  - CBC with Differential/Platelet  3. B12 deficiency  - B12 and Folate Panel - cyanocobalamin (,VITAMIN B-12,) 1000 MCG/ML injection; Inject 1 mL (1,000 mcg total) into the muscle every 14 (fourteen) days.  Dispense: 2 mL; Refill: 2  4. Atherosclerosis of aorta (H[facility])  - atorvastatin (LIPITOR) 10 MG tablet; Take 1 tablet (10 mg total) by mouth daily.  Dispense: 90 tablet; Refill: 1  5. Weight gain  - TSH  6. Hair loss  - TSH  7. Dizziness  Seeing cardiologist  8. GERD without esophagitis   9. Mild persistent asthma without complication  Advised to cut down on inhaler   10. Irritable bowel syndrome, unspecified type  Seeing GI   11. Lump in neck  - US Soft Tissue Head/Neck; Future

## 2019-04-20 NOTE — Addendum Note (Signed)
Addended by: Steele Sizer F on: 04/20/2019 09:29 AM   Modules accepted: Orders

## 2019-04-21 ENCOUNTER — Encounter: Payer: Self-pay | Admitting: Family Medicine

## 2019-04-21 LAB — CBC WITH DIFFERENTIAL/PLATELET
Basophils Absolute: 0 10*3/uL (ref 0.0–0.2)
Basos: 0 %
EOS (ABSOLUTE): 0.1 10*3/uL (ref 0.0–0.4)
Eos: 1 %
Hematocrit: 37.6 % (ref 34.0–46.6)
Hemoglobin: 12.7 g/dL (ref 11.1–15.9)
Immature Grans (Abs): 0 10*3/uL (ref 0.0–0.1)
Immature Granulocytes: 0 %
Lymphocytes Absolute: 1.3 10*3/uL (ref 0.7–3.1)
Lymphs: 16 %
MCH: 29.5 pg (ref 26.6–33.0)
MCHC: 33.8 g/dL (ref 31.5–35.7)
MCV: 87 fL (ref 79–97)
Monocytes Absolute: 0.6 10*3/uL (ref 0.1–0.9)
Monocytes: 8 %
Neutrophils Absolute: 5.7 10*3/uL (ref 1.4–7.0)
Neutrophils: 75 %
Platelets: 379 10*3/uL (ref 150–450)
RBC: 4.3 x10E6/uL (ref 3.77–5.28)
RDW: 12.4 % (ref 11.7–15.4)
WBC: 7.7 10*3/uL (ref 3.4–10.8)

## 2019-04-21 LAB — TSH: TSH: 0.018 u[IU]/mL — ABNORMAL LOW (ref 0.450–4.500)

## 2019-04-21 LAB — B12 AND FOLATE PANEL
Folate: 7.8 ng/mL (ref >3.0)
Vitamin B-12: 808 pg/mL (ref 232–1245)

## 2019-04-22 ENCOUNTER — Other Ambulatory Visit: Payer: Self-pay | Admitting: Gastroenterology

## 2019-04-22 ENCOUNTER — Other Ambulatory Visit: Payer: Self-pay | Admitting: Family Medicine

## 2019-04-22 DIAGNOSIS — R7989 Other specified abnormal findings of blood chemistry: Secondary | ICD-10-CM

## 2019-04-22 DIAGNOSIS — K580 Irritable bowel syndrome with diarrhea: Secondary | ICD-10-CM

## 2019-04-22 NOTE — Telephone Encounter (Signed)
Last office visit 04/08/2019 IBS  Last refill 04/08/19 0 refills

## 2019-04-23 ENCOUNTER — Encounter: Payer: Self-pay | Admitting: Family Medicine

## 2019-04-23 LAB — THYROID PEROXIDASE ANTIBODY: Thyroperoxidase Ab SerPl-aCnc: 105 IU/mL — ABNORMAL HIGH (ref 0–34)

## 2019-04-23 LAB — T3: T3, Total: 178 ng/dL (ref 71–180)

## 2019-04-23 LAB — SPECIMEN STATUS REPORT

## 2019-04-23 LAB — T4: T4, Total: 16.2 ug/dL — ABNORMAL HIGH (ref 4.5–12.0)

## 2019-05-09 ENCOUNTER — Other Ambulatory Visit: Payer: Self-pay | Admitting: Gastroenterology

## 2019-05-09 DIAGNOSIS — K580 Irritable bowel syndrome with diarrhea: Secondary | ICD-10-CM

## 2019-05-11 NOTE — Telephone Encounter (Signed)
Last office visit 04/08/2019 IBS  Last refill 04/22/2019 0 refills

## 2019-05-12 ENCOUNTER — Other Ambulatory Visit: Payer: Self-pay | Admitting: Family Medicine

## 2019-05-12 DIAGNOSIS — J4530 Mild persistent asthma, uncomplicated: Secondary | ICD-10-CM

## 2019-05-12 NOTE — Telephone Encounter (Signed)
Requested Prescriptions  Pending Prescriptions Disp Refills  . montelukast (SINGULAIR) 10 MG tablet [Pharmacy Med Name: MONTELUKAST SOD 10 MG TABLET] 90 tablet 1    Sig: TAKE 1 TABLET (10 MG TOTAL) BY MOUTH AT BEDTIME. TAKE 1 TABLET(10 MG) BY MOUTH AT BEDTIME     Pulmonology:  Leukotriene Inhibitors Passed - 05/12/2019  1:32 AM      Passed - Valid encounter within last 12 months    Recent Outpatient Visits          3 weeks ago Diet-controlled diabetes mellitus [facility])   [facility] Steele Sizer, MD   4 months ago Syncope and collapse   [facility] Steele Sizer, MD   5 months ago Diet-controlled diabetes mellitus [facility])   [facility] Steele Sizer, MD   11 months ago Diet-controlled diabetes mellitus [facility])   [facility] Steele Sizer, MD   1 year ago Diet-controlled diabetes mellitus [facility])   [facility] Steele Sizer, MD      Future Appointments            In 2 months Ancil Boozer, Drue Stager, MD [facility], [facility]

## 2019-05-13 ENCOUNTER — Encounter: Payer: Self-pay | Admitting: Family Medicine

## 2019-05-14 ENCOUNTER — Encounter: Payer: Self-pay | Admitting: Family Medicine

## 2019-05-19 ENCOUNTER — Other Ambulatory Visit: Payer: Self-pay | Admitting: Family Medicine

## 2019-05-19 DIAGNOSIS — E559 Vitamin D deficiency, unspecified: Secondary | ICD-10-CM

## 2019-05-19 NOTE — Telephone Encounter (Signed)
Requested medications are due for refill today?  Yes   - Vitamin D 50,000 IU cannot be delegated.    Requested medications are on active medication list?  Yes  Last Refill:  11/27/2018   # 12 capsule with one refill   Future visit scheduled? Yes  Notes to Clinic:  This strength cannot be delegated.

## 2019-05-21 ENCOUNTER — Encounter: Payer: Self-pay | Admitting: Family Medicine

## 2019-05-26 ENCOUNTER — Other Ambulatory Visit (HOSPITAL_COMMUNITY): Payer: Self-pay | Admitting: Surgery

## 2019-05-26 ENCOUNTER — Other Ambulatory Visit: Payer: Self-pay | Admitting: Gastroenterology

## 2019-05-26 DIAGNOSIS — K580 Irritable bowel syndrome with diarrhea: Secondary | ICD-10-CM

## 2019-05-26 DIAGNOSIS — Z01818 Encounter for other preprocedural examination: Secondary | ICD-10-CM

## 2019-05-29 ENCOUNTER — Ambulatory Visit (HOSPITAL_COMMUNITY)
Admission: RE | Admit: 2019-05-29 | Discharge: 2019-05-29 | Disposition: A | Discharge status: Home / Self Care | Payer: 59 | Source: Ambulatory Visit | Attending: Surgery | Admitting: Surgery

## 2019-05-29 ENCOUNTER — Other Ambulatory Visit: Payer: Self-pay

## 2019-05-29 DIAGNOSIS — Z01810 Encounter for preprocedural cardiovascular examination: Secondary | ICD-10-CM | POA: Insufficient documentation

## 2019-05-30 ENCOUNTER — Other Ambulatory Visit: Payer: Self-pay | Admitting: Family Medicine

## 2019-05-30 DIAGNOSIS — G43109 Migraine with aura, not intractable, without status migrainosus: Secondary | ICD-10-CM

## 2019-05-30 NOTE — Telephone Encounter (Signed)
Requested medication (s) are due for refill today: yes  Requested medication (s) are on the active medication list: yes  Last refill:  04/07/19  Future visit scheduled: yes  Notes to clinic:  medication not delegated to NT to refill   Requested Prescriptions  Pending Prescriptions Disp Refills   promethazine (PHENERGAN) 12.5 MG tablet [Pharmacy Med Name: PROMETHAZINE 12.5 MG TABLET] 20 tablet 0    Sig: TAKE 1 TABLET BY MOUTH EVERY 6 HOURS AS NEEDED FOR MIGRAINES WITH NAUSEA      Not Delegated - Gastroenterology: Antiemetics Failed - 05/30/2019  2:03 PM      Failed - This refill cannot be delegated      Passed - Valid encounter within last 6 months    Recent Outpatient Visits           1 month ago Diet-controlled diabetes mellitus [facility])   [facility] Steele Sizer, MD   4 months ago Syncope and collapse   [facility] Steele Sizer, MD   5 months ago Diet-controlled diabetes mellitus [facility])   [facility] Steele Sizer, MD   11 months ago Diet-controlled diabetes mellitus [facility])   [facility] Steele Sizer, MD   1 year ago Diet-controlled diabetes mellitus [facility])   [facility] Steele Sizer, MD       Future Appointments             In 3 days Steele Sizer, MD [facility], [place]   In 1 month Steele Sizer, MD [facility], [facility]

## 2019-06-02 ENCOUNTER — Ambulatory Visit (INDEPENDENT_AMBULATORY_CARE_PROVIDER_SITE_OTHER): Payer: 59 | Admitting: Family Medicine

## 2019-06-02 ENCOUNTER — Encounter: Payer: Self-pay | Admitting: Family Medicine

## 2019-06-02 ENCOUNTER — Other Ambulatory Visit: Payer: Self-pay

## 2019-06-02 VITALS — BP 129/85 | HR 79 | Temp 98.5°F | Ht 61.0 in | Wt 190.0 lb

## 2019-06-02 DIAGNOSIS — M797 Fibromyalgia: Secondary | ICD-10-CM

## 2019-06-02 MED ORDER — PREGABALIN 50 MG PO CAPS: 50.0000 mg | ORAL_CAPSULE | Freq: Three times a day (TID) | ORAL | 0 refills | Status: DC

## 2019-06-02 NOTE — Progress Notes (Signed)
Name: Claire [name]   MRN: [id number]    DOB: [birth year]   Date:06/02/2019       Progress Note  Subjective  Chief Complaint  Chief Complaint  Patient presents with  . Fibromyalgia    Last 3 months pain has been horrible-All over pain. Even her teeth, legs, and abdomen hurts, states she has been getting waxing for the past 3 years-but recently cries when she has it done because it feels like someone is ripping her skin off.     I connected with  DEANDRE STANSEL on 06/02/19 at  3:40 PM EDT by telephone and verified that I am speaking with the correct person using two identifiers.  I discussed the limitations, risks, security and privacy concerns of performing an evaluation and management service by telephone and the availability of in person appointments. Staff also discussed with the patient that there may be a patient responsible charge related to this service. Patient Location: at home  Provider Location: [facility]   HPI  FMS: she states she has a long history of FMS , she used to take Lyrica - given by Dr. Tula Nakayama  and worked well for her. She states her pain used to be a 3/10 , however over the past few months she has been having more pain, her body is much more sensitive to pain. She states last waxing visit was intolerable. She would like to try it again  Patient Active Problem List   Diagnosis Date Noted  . B12 deficiency 04/10/2018  . Columnar epithelial-lined lower esophagus   . Postprocedural disorder of digestive system   . Pain, abdominal, epigastric   . Esophageal dysphagia   . Generalized abdominal pain   . Persistent mood (affective) disorder, unspecified ([place]) 01/15/2018  . Insomnia related to another mental disorder 01/15/2018  . OCD (obsessive compulsive disorder) 10/29/2017  . GAD (generalized anxiety disorder) 10/29/2017  . Nephrolithiasis 10/22/2017  . Atherosclerosis of aorta ([place]) 12/21/2016  . Sacroiliac inflammation ([place]) 11/19/2016  . Hiatal hernia  08/17/2016  . Diverticulosis of colon 08/17/2016  . Iron deficiency anemia 08/08/2015  . Lumbosacral pain 01/07/2015  . Muscle twitching 01/07/2015  . Intertrigo 11/05/2014  . Perennial allergic rhinitis 09/03/2014  . Lung nodules 09/03/2014  . Vitamin D deficiency 09/03/2014  . Diet-controlled diabetes mellitus ([place]) 09/03/2014  . History of kidney stones 09/03/2014  . Chronic insomnia 09/03/2014  . History of iron deficiency anemia 09/03/2014  . Depression with anxiety 09/03/2014  . Palpitations 06/22/2014  . Bariatric surgery status 12/03/2013  . Asthma, mild persistent 11/21/2012  . CN (constipation) 11/21/2012  . GERD without esophagitis 11/21/2012  . History of hyperlipidemia 11/21/2012  . Migraine with aura and without status migrainosus 11/21/2012  . History of sleep apnea 11/21/2012  . IBS (irritable bowel syndrome) 08/15/2012    Past Surgical History:  Procedure Laterality Date  . BLADDER SURGERY  2010  . BREAST BIOPSY Right 2014   stereotatic biopsy  . CHOLECYSTECTOMY  2010  . COLONOSCOPY  2014    [facility] at [facility]  . COLONOSCOPY WITH PROPOFOL N/A 01/28/2018   Procedure: COLONOSCOPY WITH PROPOFOL;  Surgeon: Virgel Manifold, MD;  Location: [place];  Service: Endoscopy;  Laterality: N/A;  . DILITATION & CURRETTAGE/HYSTROSCOPY WITH NOVASURE ABLATION N/A 06/16/2015   Procedure: DILATATION & CURETTAGE/HYSTEROSCOPY WITH NOVASURE ABLATION;  Surgeon: Brien Few, MD;  Location: [facility];  Service: Gynecology;  Laterality: N/A;  . ESOPHAGOGASTRODUODENOSCOPY (EGD) WITH PROPOFOL N/A 01/28/2018   Procedure: ESOPHAGOGASTRODUODENOSCOPY (  EGD) WITH BIOPSIES;  Surgeon: Virgel Manifold, MD;  Location: [place];  Service: Endoscopy;  Laterality: N/A;  . [place] RESECTION  2012  . LAPAROSCOPY N/A 06/16/2015   Procedure: LAPAROSCOPY DIAGNOSTIC;  Surgeon: Brien Few, MD;  Location: [facility];  Service: Gynecology;  Laterality: N/A;  .  LAPAROSCOPY N/A 04/01/2018   Procedure: LAPAROSCOPY DIAGNOSTIC ERAS PATHWAY ENTEROLYSIS, CECOPEXY;  Surgeon: Johnathan Hausen, MD;  Location: [facility];  Service: General;  Laterality: N/A;  . LITHOTRIPSY  12/11/2017   laser lithotripsy  . LYSIS OF ADHESION N/A 06/16/2015   Procedure: LYSIS OF ADHESION;  Surgeon: Brien Few, MD;  Location: [facility];  Service: Gynecology;  Laterality: N/A;  . PLANTAR FASCIA SURGERY Bilateral   . ROBOTIC ASSISTED SALPINGO OOPHERECTOMY Right 06/16/2015   Procedure: ROBOTIC ASSISTED SALPINGO OOPHORECTOMY, EXCISION OF RIGHT CUL DE [place] MASS;  Surgeon: Brien Few, MD;  Location: [facility];  Service: Gynecology;  Laterality: Right;  . TONSILLECTOMY    . UPPER GI ENDOSCOPY      Family History  Problem Relation Age of Onset  . Heart attack Mother   . Stroke Mother   . Multiple myeloma Mother   . Hyperlipidemia Father   . Heart attack Sister   . Heart attack Maternal Uncle   . Heart attack Paternal Grandfather   . Breast cancer Paternal Aunt   . Breast cancer Paternal Grandmother   . Lung cancer Maternal Grandfather     Social History   Tobacco Use  . Smoking status: Former Smoker    Packs/day: 1.00    Years: 10.00    Pack years: 10.00    Types: Cigarettes    Quit date: 02/19/1993    Years since quitting: 26.2  . Smokeless tobacco: Never Used  Substance Use Topics  . Alcohol use: Yes    Alcohol/week: 0.0 standard drinks    Comment: occasionally    Current Outpatient Medications:  .  Accu-Chek FastClix Lancets MISC, USE TO TEST UP TO 4 TIMES A DAY, Disp: , Rfl:  .  ACCU-CHEK GUIDE test strip, , Disp: , Rfl:  .  ADDERALL XR 30 MG 24 hr capsule, Take 30 mg by mouth at bedtime., Disp: , Rfl:  .  albuterol (VENTOLIN HFA) 108 (90 Base) MCG/ACT inhaler, TAKE 2 PUFFS EVERY 6 HOURS AS NEEDED FOR WHEEZING OR SHORTNESS OF BREATH. (VENTOLIN NOT COVERED), Disp: 8.5 g, Rfl: 2 .  Ascorbic Acid (VITAMIN C) 1000 MG tablet, Take 1,000 mg by mouth., Disp: , Rfl:  .   atorvastatin (LIPITOR) 10 MG tablet, Take 1 tablet (10 mg total) by mouth daily., Disp: 90 tablet, Rfl: 1 .  blood glucose meter kit and supplies, Dispense based on patient and insurance preference. Use up to four times daily as directed. (FOR ICD-10 E10.9, E11.9)., Disp: 1 each, Rfl: 2 .  calcium carbonate (OS-CAL) 600 MG TABS tablet, Take 600 mg by mouth daily with breakfast., Disp: , Rfl:  .  clobetasol ointment (TEMOVATE) 6.96 %, Apply 1 application topically daily as needed (psoriasis). , Disp: , Rfl: 0 .  cyanocobalamin (,VITAMIN B-12,) 1000 MCG/ML injection, Inject 1 mL (1,000 mcg total) into the muscle every 14 (fourteen) days., Disp: 2 mL, Rfl: 2 .  cyclobenzaprine (FLEXERIL) 10 MG tablet, Take 1 tablet (10 mg total) by mouth at bedtime. Prn (Patient taking differently: Take 10 mg by mouth at bedtime as needed (migraine). ), Disp: 90 tablet, Rfl: 1 .  dicyclomine (BENTYL) 10 MG capsule, TAKE 1  CAPSULE (10 MG TOTAL) BY MOUTH 4 (FOUR) TIMES DAILY - BEFORE MEALS AND AT BEDTIME., Disp: 90 capsule, Rfl: 1 .  fluconazole (DIFLUCAN) 150 MG tablet, Take 1 tablet (150 mg total) by mouth every other day., Disp: 9 tablet, Rfl: 2 .  fluocinonide (LIDEX) 0.05 % external solution, APPLY TO AFFECTED SCALP TWICE DAILY UNTIL CLEAR, THEN AS NEEDED, Disp: , Rfl:  .  ketoconazole (NIZORAL) 2 % shampoo, PLEASE SEE ATTACHED FOR DETAILED DIRECTIONS, Disp: , Rfl:  .  Lactobacillus (ACIDOPHILUS PO), Take 1 tablet by mouth daily., Disp: , Rfl:  .  loratadine (CLARITIN) 10 MG tablet, Take 10 mg by mouth daily., Disp: , Rfl:  .  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 , Disp: , Rfl: 4 .  mometasone (NASONEX) 50 MCG/ACT nasal spray, USE 2 SPRAYS IN EACH NOSTRIL AT BEDTIME AS NEEDED FOR ALLERGIES, Disp: 17 g, Rfl: 1 .  montelukast (SINGULAIR) 10 MG tablet, TAKE 1 TABLET (10 MG TOTAL) BY MOUTH AT BEDTIME. TAKE 1 TABLET(10 MG) BY MOUTH AT BEDTIME, Disp: 90  tablet, Rfl: 3 .  omeprazole (PRILOSEC) 40 MG capsule, Take 1 capsule (40 mg total) by mouth 2 (two) times daily before a meal., Disp: 60 capsule, Rfl: 2 .  ondansetron (ZOFRAN-ODT) 4 MG disintegrating tablet, Take by mouth., Disp: , Rfl:  .  promethazine (PHENERGAN) 12.5 MG tablet, TAKE 1 TABLET BY MOUTH EVERY 6 HOURS AS NEEDED FOR MIGRAINES WITH NAUSEA, Disp: 20 tablet, Rfl: 0 .  Semaglutide,0.25 or 0.5MG/DOS, (OZEMPIC, 0.25 OR 0.5 MG/DOSE,) 2 MG/1.5ML SOPN, Inject 0.5 mg into the skin once a week., Disp: 2 pen, Rfl: 0 .  SYRINGE-NEEDLE, DISP, 3 ML (SAFETY SYRINGES/NEEDLE) 20G X 1-1/2" 3 ML MISC, 1 each by Does not apply route every 30 (thirty) days., Disp: 50 each, Rfl: 0 .  traZODone (DESYREL) 100 MG tablet, Take 100-200 mg by mouth at bedtime as needed., Disp: , Rfl:  .  TRINTELLIX 20 MG TABS tablet, Take 20 mg by mouth every morning. , Disp: , Rfl:  .  Vitamin D, Ergocalciferol, (DRISDOL) 1.25 MG (50000 UNIT) CAPS capsule, TAKE 1 CAPSULE (50,000 UNITS TOTAL) BY MOUTH EVERY 7 (SEVEN) DAYS. TAKE 1 CAPSULE EVERY 7 DAYS, Disp: 12 capsule, Rfl: 1 .  zonisamide (ZONEGRAN) 50 MG capsule, Take 150 capsules by mouth daily. , Disp: , Rfl: 2 .  aspirin EC 81 MG tablet, Take 81 mg by mouth daily., Disp: , Rfl:   Allergies  Allergen Reactions  . Aspartame And Phenylalanine Nausea Only and Other (See Comments)    GI upset   . Gabapentin     Bladder incontinence at higher dose  . Linzess [Linaclotide]     Worsening of diarrhea  . Other Hives and Other (See Comments)    Ricotta Cheese: flushed and hot  . Adhesive [Tape] Rash    Some clear tapes  . Cymbalta [Duloxetine Hcl] Palpitations    And photosensitivity  . Tapentadol Rash    Some clear tapes    I personally reviewed active problem list, medication list, allergies, family history, social history, health maintenance with the patient/caregiver today.   ROS  Ten systems reviewed and is negative except as mentioned in HPI    Objective  Virtual encounter, vitals not obtained.  Body mass index is 35.9 kg/m.  Physical Exam  Awake, alert and oriented  PHQ2/9: Depression screen [facility] 2/9 06/02/2019 04/20/2019 01/07/2019 12/12/2018 06/11/2018  Decreased Interest 0 0  0 1 0  Down, Depressed, Hopeless 0 0 0 1 1  PHQ - 2 Score 0 0 0 2 1  Altered sleeping 0 1 0 1 -  Tired, decreased energy 0 1 1 1  -  Change in appetite 0 0 0 1 -  Feeling bad or failure about yourself  0 0 0 0 -  Trouble concentrating 0 1 1 3  -  Moving slowly or fidgety/restless 0 0 0 0 -  Suicidal thoughts 0 0 0 0 -  PHQ-9 Score 0 3 2 8  -  Difficult doing work/chores Not difficult at all Somewhat difficult Somewhat difficult - -  Some recent data might be hidden   PHQ-2/9 Result is negative.    Fall Risk: Fall Risk  06/02/2019 04/20/2019 01/07/2019 12/12/2018 06/11/2018  Falls in the past year? 0 0 0 0 0  Number falls in past yr: 0 0 0 0 0  Injury with Fall? 0 0 0 0 0     Assessment & Plan  1. Fibromyalgia  - pregabalin (LYRICA) 50 MG capsule; Take 1 capsule (50 mg total) by mouth 3 (three) times daily.  Dispense: 90 capsule; Refill: 0.  Start with 50 mg qhs , you can increase slowly to up to 100 mg TID and call me back when you need a refill   I discussed the assessment and treatment plan with the patient. The patient was provided an opportunity to ask questions and all were answered. The patient agreed with the plan and demonstrated an understanding of the instructions.   The patient was advised to call back or seek an in-person evaluation if the symptoms worsen or if the condition fails to improve as anticipated.  I provided 15 minutes of non-face-to-face time during this encounter.  Loistine Chance, MD

## 2019-06-02 NOTE — Patient Instructions (Signed)
Start with 50 mg qhs , you can increase slowly to up to 100 mg TID and call me back when you need a refill

## 2019-06-04 ENCOUNTER — Encounter: Payer: 59 | Attending: Surgery | Admitting: Dietician

## 2019-06-04 ENCOUNTER — Encounter: Payer: Self-pay | Admitting: Dietician

## 2019-06-04 ENCOUNTER — Other Ambulatory Visit: Payer: Self-pay

## 2019-06-04 VITALS — Ht 61.0 in | Wt 189.7 lb

## 2019-06-04 DIAGNOSIS — R109 Unspecified abdominal pain: Secondary | ICD-10-CM | POA: Insufficient documentation

## 2019-06-04 DIAGNOSIS — Z9884 Bariatric surgery status: Secondary | ICD-10-CM | POA: Insufficient documentation

## 2019-06-04 DIAGNOSIS — K449 Diaphragmatic hernia without obstruction or gangrene: Secondary | ICD-10-CM | POA: Insufficient documentation

## 2019-06-04 DIAGNOSIS — K2100 Gastro-esophageal reflux disease with esophagitis, without bleeding: Secondary | ICD-10-CM | POA: Diagnosis not present

## 2019-06-04 DIAGNOSIS — Z713 Dietary counseling and surveillance: Secondary | ICD-10-CM | POA: Diagnosis not present

## 2019-06-04 NOTE — Patient Instructions (Signed)
   Continue to eat balanced meals with lean protein, vegetables, and only small amounts of carbs.   Keep up some regular exercise, great job so far!

## 2019-06-04 NOTE — Progress Notes (Signed)
Nutrition Assessment Proposed Surgery: revision from sleeve gastrectomy to gastric bypass   Height: 5'1" Weight: 189.7lbs BMI: 35.8 Upper IBW% (UIBW): 163% (UIBW 116lbs)  Patient's Goal Weight: 127lbs  Medical History: Type 2 diabetes, GERD, fibromyalgia Medications:  Adderall, Albuterol inhaler, atorvastatin, cyclobenzaprine, dicyclomine, bluconazole, lactobacillus, loratadine, montelukast, omeprazole, ondansetron, pregalabin, promthazine, Semaglutide, traZODone, trintellix, zonisamide Supplements: Calcium carbonate, Vitamin B12 injections, Vitamin D   Previous surgeries: sleeve gastrectomy 2012; glabladder removal 2010; bladder sling/ repair; tonsillectomy about 3 years ago; sacum repair 2019 Drug allergies: linzess, gababentin Food allergies: aspartame (GI distress), ricotta cheese Alcohol use: rarely  Tobacco use: none  Physical activity: treadmill 30-45 minutes, 2x a week  Weight history: Childhood: normal    Adolescence: slightly above normal    Adulthood: gradual weight gain over years after having children    Weight 1 year ago: 127lbs (03/2018)  Dieting/ weight loss history:  highest adult weight 285lbs; some dieting through weight loss clinic with short-term success. Maintained goal weight for 8 years after sleeve surgery, with only small amount of weight gain until last year -- decrease in physical activity with working form home, increased GI symptoms of GERD/ abdominal pain have led to weight increase.  Unable to take multivitamins with iron due to GI upset;  Cans vegetables at home  Dietary Recall:  Daily pattern: 3 meals and 1-2 snacks. Dining out: 3-4 meals per week. Eats small portion, takes remainder home to eat later Breakfast: protein shake; 1egg with 1/2pc toast sm amount bacon Lunch: 4/14 sashimi; usually leftovers from supper Supper: usu meat + 2 veg + occasionally small portion starch to avoid low BG. occ wendys nuggets and 1/3 baked potato Snack(s): [employer]  protein bar Beverages: water, often flavored with stevia based sweetener  Psychosocial: Emotional eating history: no; occasional extra snacking Disordered eating history: no   Intervention:  Patient has understanding of this procedure through personal surgery history, written resources.   Instructed her on pre-op diet guidelines.   Discussed stages of the bariatric diet after surgery as well as the importance of adequate protein and fluid intake.   Summary:  Patient has begun resuming bariatric diet guidelinesmade diet and lifestyle changes in effort to halt weight gain and prepare for bariatric surgery.  She has solid support from family.   She agrees to cotninue with low-carb diet and adequate protein and vegetables prior to surgery.   She is motivated to follow the bariatric diet after surgery. From a nutrition standpoint, she is ready to proceed with the bariatric surgery program.    Plan:  Patient commits to returning for supervised weight loss visits + pre-op class prior to surgery.   She will plan to return for post-op RD visits beginning 2 weeks after surgery.

## 2019-06-09 ENCOUNTER — Encounter: Payer: Self-pay | Admitting: Dietician

## 2019-06-09 ENCOUNTER — Encounter: Payer: 59 | Admitting: Dietician

## 2019-06-09 ENCOUNTER — Ambulatory Visit: Payer: 59 | Admitting: Dietician

## 2019-06-09 ENCOUNTER — Other Ambulatory Visit: Payer: Self-pay

## 2019-06-09 VITALS — Ht 61.0 in | Wt 188.5 lb

## 2019-06-09 DIAGNOSIS — Z713 Dietary counseling and surveillance: Secondary | ICD-10-CM | POA: Diagnosis not present

## 2019-06-09 NOTE — Patient Instructions (Signed)
Continue with current eating pattern and regular physical activity °

## 2019-06-09 NOTE — Progress Notes (Signed)
Appt start time: 0900 end time:  0915.  Assessment:   #1 SWL Appointment.   Start Wt at NDES: 189.8lbs Wt: 188.5lbs Ht: 5'1" BMI: 35.62   Learning Readiness:   Change in progress  MEDICATIONS: Adderall, Albuterol inhaler, atorvastatin, cyclobenzaprine, dicyclomine, bluconazole, lactobacillus, loratadine, montelukast, omeprazole, ondansetron, pregalabin, promthazine, Semaglutide, traZODone, trintellix, zonisamide  SUPPLEMENTS:  Calcium carbonate, Vitamin B12 injections, Vitamin D  Progress:  Weight measured on patient's home scale for virtual visit.   Patient has maintained healthy eating pattern, and reports an increase in fluid intake.   She continues to incorporate physical activity.  Dietary intake:  Breakfast: protein shake; 1 egg with 1/2 pc toast, occ small amount bacon  Snack: sometimes protein bar  Lunch: leftovers Snack: sometimes protein bar Dinner: 4/19 baked [place] breast, carrots, small portion risotto Snack: none or protein bar Beverages: water, often with stevia sweetened flavoring  Usual physical activity: treadmill 30-45 minutes, 2x a week + home repairs at rental property  Diet to Follow: Maintain current eating pattern and regular physical activity              Nutritional Diagnosis:  [place]-3.3 Overweight/obesity related to history of excess calories and physical inactivity as evidenced by patient current BMI of 35.6, following dietary guidelines for continued weight loss prior to bariatric surgery revision.              Intervention:   . Nutrition counseling for weight loss prior to upcoming bariatric surgery. . Patient will continue to check her weight on same scale, same time of day to accurately measure change.   Teaching Method Utilized:  Visual Auditory   Barriers to learning/adherence to lifestyle change: none  Demonstrated degree of understanding via:  Teach Back   Monitoring/Evaluation:  Dietary intake, exercise, and body weight  06/18/19.

## 2019-06-15 ENCOUNTER — Encounter: Payer: Self-pay | Admitting: Family Medicine

## 2019-06-18 ENCOUNTER — Ambulatory Visit: Payer: 59 | Admitting: Dietician

## 2019-06-18 ENCOUNTER — Encounter: Payer: Self-pay | Admitting: Dietician

## 2019-06-18 VITALS — Ht 61.0 in | Wt 185.6 lb

## 2019-06-18 DIAGNOSIS — Z6835 Body mass index (BMI) 35.0-35.9, adult: Secondary | ICD-10-CM

## 2019-06-18 DIAGNOSIS — Z713 Dietary counseling and surveillance: Secondary | ICD-10-CM | POA: Diagnosis not present

## 2019-06-18 NOTE — Progress Notes (Signed)
Appt start time: 1325 end time:  1340.  Assessment:   #2 SWL Appointment.   Start Wt at NDES: 189.7lbs Wt: 185.6lbs Ht: 5'1" BMI: 35.07   Learning Readiness:   Change in progress  MEDICATIONS: Adderall, Albuterol inhaler, atorvastatin, cyclobenzaprine, dicyclomine, bluconazole, lactobacillus, loratadine, montelukast, omeprazole, ondansetron, pregalabin, promthazine, Semaglutide, traZODone, trintellix, zonisamide  SUPPLEMENTS:  Calcium carbonate, Vitamin B12 injections, Vitamin D  Progress:  Patient reports increased water intake and fewer sodas as positive health change and due to dry mouth side effect from medication.  She has also been reducing carb intake.   She has plans to meal prep for breakfast to improve food choices.   Physical activity has also continued to increase.  Dietary intake:  Breakfast: protein shake or egg with small amount of toast   Snack: occasional protein bar  Lunch: leftovers or salad with protein Snack: occasional protein bar Dinner: lean protein + low-carb vegetables + occasional small portion of starchy food Snack: none or protein bar Beverages: 80-100oz water, sometimes with Crystal Light Pure  Usual physical activity: walking (4500 steps+) 2-3 times per week (increasing daily steps to goal of 10,000) + home repairs  Diet to Follow: Maintain current eating pattern and regular physical activity              Nutritional Diagnosis:  [place]-3.3 Overweight/obesity related to history of excess calories and physical inactivity as evidenced by patient current BMI of 35.1, following dietary guidelines for continued weight loss prior to bariatric surgery revision.              Intervention:   . Nutrition counseling for weight loss prior to upcoming bariatric surgery revision.  Teaching Method Utilized:  Visual Auditory   Barriers to learning/adherence to lifestyle change: none  Demonstrated degree of understanding via:  Teach Back    Monitoring/Evaluation:  Dietary intake, exercise, and body weight 06/24/19.

## 2019-06-24 ENCOUNTER — Telehealth: Payer: 59 | Admitting: Dietician

## 2019-06-24 ENCOUNTER — Ambulatory Visit: Payer: 59 | Admitting: Dietician

## 2019-06-26 ENCOUNTER — Telehealth: Payer: 59 | Admitting: Dietician

## 2019-06-26 ENCOUNTER — Ambulatory Visit: Payer: 59 | Admitting: Dietician

## 2019-06-29 ENCOUNTER — Other Ambulatory Visit: Payer: Self-pay | Admitting: Gastroenterology

## 2019-06-29 DIAGNOSIS — K219 Gastro-esophageal reflux disease without esophagitis: Secondary | ICD-10-CM

## 2019-06-29 MED ORDER — GENERIC EXTERNAL MEDICATION
150.00 | Status: DC
Start: 2019-06-30 — End: 2019-06-29

## 2019-06-29 MED ORDER — DIPHENHYDRAMINE HCL 25 MG PO CAPS
25.00 | ORAL_CAPSULE | ORAL | Status: DC
Start: ? — End: 2019-06-29

## 2019-06-29 MED ORDER — DICYCLOMINE HCL 10 MG PO CAPS
10.00 | ORAL_CAPSULE | ORAL | Status: DC
Start: 2019-06-30 — End: 2019-06-29

## 2019-06-29 MED ORDER — FAMOTIDINE 20 MG/2ML IV SOLN
20.00 | INTRAVENOUS | Status: DC
Start: ? — End: 2019-06-29

## 2019-06-29 MED ORDER — POTASSIUM CHLORIDE CRYS ER 10 MEQ PO TBCR
20.00 | EXTENDED_RELEASE_TABLET | ORAL | Status: DC
Start: 2019-07-01 — End: 2019-06-29

## 2019-06-29 MED ORDER — MONTELUKAST SODIUM 10 MG PO TABS
10.00 | ORAL_TABLET | ORAL | Status: DC
Start: 2019-06-30 — End: 2019-06-29

## 2019-06-29 MED ORDER — PANTOPRAZOLE SODIUM 40 MG PO TBEC
40.00 | DELAYED_RELEASE_TABLET | ORAL | Status: DC
Start: 2019-06-30 — End: 2019-06-29

## 2019-06-29 MED ORDER — FAMOTIDINE 20 MG PO TABS
20.00 | ORAL_TABLET | ORAL | Status: DC
Start: 2019-06-30 — End: 2019-06-29

## 2019-06-29 MED ORDER — PREGABALIN 100 MG PO CAPS
100.00 | ORAL_CAPSULE | ORAL | Status: DC
Start: 2019-06-30 — End: 2019-06-29

## 2019-06-29 MED ORDER — OXYCODONE HCL 5 MG PO TABS
5.00 | ORAL_TABLET | ORAL | Status: DC
Start: ? — End: 2019-06-29

## 2019-06-29 MED ORDER — TRAZODONE HCL 100 MG PO TABS
200.00 | ORAL_TABLET | ORAL | Status: DC
Start: 2019-06-30 — End: 2019-06-29

## 2019-06-29 MED ORDER — ATORVASTATIN CALCIUM 10 MG PO TABS
10.00 | ORAL_TABLET | ORAL | Status: DC
Start: 2019-07-01 — End: 2019-06-29

## 2019-06-29 MED ORDER — GUAIFENESIN 100 MG/5ML PO SYRP
200.00 | ORAL_SOLUTION | ORAL | Status: DC
Start: ? — End: 2019-06-29

## 2019-06-29 MED ORDER — GENERIC EXTERNAL MEDICATION
150.00 | Status: DC
Start: 2019-07-01 — End: 2019-06-29

## 2019-06-29 MED ORDER — ERGOCALCIFEROL 1.25 MG (50000 UT) PO CAPS
1250.00 | ORAL_CAPSULE | ORAL | Status: DC
Start: 2019-07-02 — End: 2019-06-29

## 2019-06-29 MED ORDER — ALBUTEROL SULFATE HFA 108 (90 BASE) MCG/ACT IN AERS
2.00 | INHALATION_SPRAY | RESPIRATORY_TRACT | Status: DC
Start: ? — End: 2019-06-29

## 2019-06-29 MED ORDER — DEXTROSE 50 % IV SOLN
12.50 | INTRAVENOUS | Status: DC
Start: ? — End: 2019-06-29

## 2019-06-29 MED ORDER — LOPERAMIDE HCL 2 MG PO CAPS
2.00 | ORAL_CAPSULE | ORAL | Status: DC
Start: ? — End: 2019-06-29

## 2019-06-29 MED ORDER — VORTIOXETINE HBR 10 MG PO TABS
20.00 | ORAL_TABLET | ORAL | Status: DC
Start: 2019-07-01 — End: 2019-06-29

## 2019-06-29 MED ORDER — SODIUM CHLORIDE 0.9 % IV SOLN
1000.00 | INTRAVENOUS | Status: DC
Start: ? — End: 2019-06-29

## 2019-06-29 MED ORDER — METHYLPREDNISOLONE SODIUM SUCC 125 MG IJ SOLR
125.00 | INTRAMUSCULAR | Status: DC
Start: ? — End: 2019-06-29

## 2019-06-29 MED ORDER — INSULIN LISPRO 100 UNIT/ML ~~LOC~~ SOLN
0.00 | SUBCUTANEOUS | Status: DC
Start: 2019-07-01 — End: 2019-06-29

## 2019-06-29 MED ORDER — FLUTICASONE PROPIONATE 50 MCG/ACT NA SUSP
2.00 | NASAL | Status: DC
Start: 2019-07-01 — End: 2019-06-29

## 2019-06-29 MED ORDER — EPINEPHRINE 0.3 MG/0.3ML IJ SOAJ
0.30 | INTRAMUSCULAR | Status: DC
Start: ? — End: 2019-06-29

## 2019-06-29 MED ORDER — ENOXAPARIN SODIUM 40 MG/0.4ML ~~LOC~~ SOLN
40.00 | SUBCUTANEOUS | Status: DC
Start: 2019-06-30 — End: 2019-06-29

## 2019-06-29 MED ORDER — ACETAMINOPHEN 325 MG PO TABS
650.00 | ORAL_TABLET | ORAL | Status: DC
Start: ? — End: 2019-06-29

## 2019-06-29 MED ORDER — DEXAMETHASONE 4 MG PO TABS
6.00 | ORAL_TABLET | ORAL | Status: DC
Start: 2019-07-01 — End: 2019-06-29

## 2019-06-29 MED ORDER — PNEUMOCOCCAL VAC POLYVALENT 25 MCG/0.5ML IJ INJ
0.50 | INJECTION | INTRAMUSCULAR | Status: DC
Start: ? — End: 2019-06-29

## 2019-06-29 MED ORDER — GENERIC EXTERNAL MEDICATION
100.00 | Status: DC
Start: 2019-06-29 — End: 2019-06-29

## 2019-06-29 MED ORDER — MELATONIN 3 MG PO TABS
3.00 | ORAL_TABLET | ORAL | Status: DC
Start: ? — End: 2019-06-29

## 2019-06-29 MED ORDER — LORATADINE 10 MG PO TABS
10.00 | ORAL_TABLET | ORAL | Status: DC
Start: 2019-07-01 — End: 2019-06-29

## 2019-06-29 NOTE — Telephone Encounter (Signed)
Last office visit 02/17/202 IBS GERD  Last refill

## 2019-07-01 ENCOUNTER — Ambulatory Visit: Payer: 59 | Admitting: Dietician

## 2019-07-01 ENCOUNTER — Telehealth: Payer: 59 | Admitting: Dietician

## 2019-07-03 ENCOUNTER — Ambulatory Visit: Payer: 59 | Admitting: Dietician

## 2019-07-03 ENCOUNTER — Telehealth: Payer: 59 | Admitting: Dietician

## 2019-07-09 ENCOUNTER — Encounter: Payer: Self-pay | Admitting: Family Medicine

## 2019-07-10 ENCOUNTER — Ambulatory Visit: Payer: 59

## 2019-07-10 ENCOUNTER — Telehealth: Payer: Self-pay

## 2019-07-10 NOTE — Telephone Encounter (Signed)
Copied from [place] [id number]. Topic: Appointment Scheduling - Scheduling Inquiry for Clinic >> Jul 09, 2019  4:30 PM Jaynie Collins D wrote: Reason for CRM: Patient was admitted to hospital a month ago for Covid-19, was hospitalized for a week and was advised to follow up with her PCP. Patient would prefer virtual visit. Patient was advised visit on 07/21/19 cannot be combined with Covid-19 follow-up visit and needs to be seen in about a week. The next available virtual appt isn't until 08/03/19 >> Jul 10, 2019  9:47 AM [place], Marland Kitchen wrote: Called pt to inform her that her 07/21/2019 appt will be changed to [place]. Had to LVM

## 2019-07-11 ENCOUNTER — Other Ambulatory Visit: Payer: Self-pay | Admitting: Family Medicine

## 2019-07-11 DIAGNOSIS — E538 Deficiency of other specified B group vitamins: Secondary | ICD-10-CM

## 2019-07-11 NOTE — Telephone Encounter (Signed)
Requested Prescriptions  Pending Prescriptions Disp Refills  . cyanocobalamin (,VITAMIN B-12,) 1000 MCG/ML injection [Pharmacy Med Name: CYANOCOBALAMIN 1,000 MCG/ML] 6 mL 0    Sig: INJECT 1 ML (1,000 MCG TOTAL) INTO THE MUSCLE EVERY 14 (FOURTEEN) DAYS.     Off-Protocol Failed - 07/11/2019  1:30 PM      Failed - Medication not assigned to a protocol, review manually.      Passed - Valid encounter within last 12 months    Recent Outpatient Visits          1 month ago [facility] Steele Sizer, MD   2 months ago Diet-controlled diabetes mellitus [facility])   [facility] Steele Sizer, MD   6 months ago Syncope and collapse   [facility] Steele Sizer, MD   7 months ago Diet-controlled diabetes mellitus [facility])   [facility] Steele Sizer, MD   1 year ago Diet-controlled diabetes mellitus [facility])   [facility] Steele Sizer, MD      Future Appointments            In 1 week Steele Sizer, MD [facility], [facility]          Off-Protocol Failed - 07/11/2019  1:30 PM      Failed - Medication not assigned to a protocol, review manually.      Passed - Valid encounter within last 12 months    Recent Outpatient Visits          1 month ago [facility] Steele Sizer, MD   2 months ago Diet-controlled diabetes mellitus [facility])   [facility] Steele Sizer, MD   6 months ago Syncope and collapse   [facility] Steele Sizer, MD   7 months ago Diet-controlled diabetes mellitus [facility])   [facility] Steele Sizer, MD   1 year ago Diet-controlled diabetes mellitus [facility])   [facility] Steele Sizer, MD      Future Appointments            In 1 week Steele Sizer, MD [name], [facility]

## 2019-07-15 ENCOUNTER — Encounter: Payer: Self-pay | Admitting: Family Medicine

## 2019-07-15 ENCOUNTER — Ambulatory Visit: Payer: 59 | Admitting: Family Medicine

## 2019-07-15 ENCOUNTER — Other Ambulatory Visit: Payer: Self-pay

## 2019-07-15 VITALS — BP 118/72 | HR 81 | Temp 97.1°F | Resp 16 | Ht 61.0 in | Wt 185.6 lb

## 2019-07-15 DIAGNOSIS — Z8616 Personal history of COVID-19: Secondary | ICD-10-CM

## 2019-07-15 DIAGNOSIS — U071 COVID-19: Secondary | ICD-10-CM | POA: Diagnosis not present

## 2019-07-15 DIAGNOSIS — Z09 Encounter for follow-up examination after completed treatment for conditions other than malignant neoplasm: Secondary | ICD-10-CM | POA: Diagnosis not present

## 2019-07-15 DIAGNOSIS — J1282 Pneumonia due to coronavirus disease 2019: Secondary | ICD-10-CM

## 2019-07-15 DIAGNOSIS — R0902 Hypoxemia: Secondary | ICD-10-CM | POA: Diagnosis not present

## 2019-07-15 NOTE — Progress Notes (Signed)
Name: Claire [name]   MRN: [id number]    DOB: Apr 10, 1970   Date:07/15/2019       Progress Note  Subjective  Chief Complaint  Chief Complaint  Patient presents with  . Hospitalization Follow-up    Covid 19  . Shortness of Breath    on exertion    HPI  Hospital discharge: she developed fevers nausea, upper abdominal pain and diarrhea end of April, she finally went to [facility]  on 06/24/2019 but sent home with diagnosis of viral diarrhea, however the following day she went back because she was having sob and was diagnosed with multifocal viral pneumonia due to COVID-19, she required up to 7 liters of oxygen secondary to hypoxia, she was given Remdesivir , steroid and two randomized drugs [facility] IRB Study and CVC , she was also placed on lovenox during her stay . During her stay she had mild elevation of LFT's. She was discharged home on May 11 th 2021 and had to use nasal cannula oxygen up to yesterday.   She was discharged home on:  study drug called ACTIV-1/placebo tablets Dexamethasone  Guaifenesin  pepcid Tylenol   She was advised to continue Atorvastatin   Since she has been home, she has been taking medications as prescribed, she is out of steroids, but still taking drug. She still has intermittent brain fog, difficulty concentrating, intermittent headaches, still gets sob with activity and is using oxygen intermittently at home. She is still fatigues and has been sleeping more than usual . She has intermittent cough - last night seemed to be wet. No wheezing. Appetite is back to normal. Diarrhea resolved. She states worried about returning to work due to mental fogginess and afraid of making mistakes. We will refer her to pulmonologist and extend time off until June 1st, to resume work on June 2 nd, 2021 Explained mental fogginess may last months   Patient Active Problem List   Diagnosis Date Noted  . B12 deficiency 04/10/2018  . Columnar epithelial-lined lower esophagus   .  Postprocedural disorder of digestive system   . Pain, abdominal, epigastric   . Esophageal dysphagia   . Generalized abdominal pain   . Persistent mood (affective) disorder, unspecified ([place]) 01/15/2018  . Insomnia related to another mental disorder 01/15/2018  . OCD (obsessive compulsive disorder) 10/29/2017  . GAD (generalized anxiety disorder) 10/29/2017  . Nephrolithiasis 10/22/2017  . Atherosclerosis of aorta ([place]) 12/21/2016  . Sacroiliac inflammation ([place]) 11/19/2016  . Hiatal hernia 08/17/2016  . Diverticulosis of colon 08/17/2016  . Iron deficiency anemia 08/08/2015  . Lumbosacral pain 01/07/2015  . Muscle twitching 01/07/2015  . Intertrigo 11/05/2014  . Perennial allergic rhinitis 09/03/2014  . Lung nodules 09/03/2014  . Vitamin D deficiency 09/03/2014  . Diet-controlled diabetes mellitus ([place]) 09/03/2014  . History of kidney stones 09/03/2014  . Chronic insomnia 09/03/2014  . History of iron deficiency anemia 09/03/2014  . Depression with anxiety 09/03/2014  . Palpitations 06/22/2014  . Bariatric surgery status 12/03/2013  . Asthma, mild persistent 11/21/2012  . CN (constipation) 11/21/2012  . GERD without esophagitis 11/21/2012  . History of hyperlipidemia 11/21/2012  . Migraine with aura and without status migrainosus 11/21/2012  . History of sleep apnea 11/21/2012  . IBS (irritable bowel syndrome) 08/15/2012    Past Surgical History:  Procedure Laterality Date  . BLADDER SURGERY  2010  . BREAST BIOPSY Right 2014   stereotatic biopsy  . CHOLECYSTECTOMY  2010  . COLONOSCOPY  2014  Done at [facility]  . COLONOSCOPY WITH PROPOFOL N/A 01/28/2018   Procedure: COLONOSCOPY WITH PROPOFOL;  Surgeon: Virgel Manifold, MD;  Location: [place];  Service: Endoscopy;  Laterality: N/A;  . DILITATION & CURRETTAGE/HYSTROSCOPY WITH NOVASURE ABLATION N/A 06/16/2015   Procedure: DILATATION & CURETTAGE/HYSTEROSCOPY WITH NOVASURE ABLATION;  Surgeon: Brien Few,  MD;  Location: [facility];  Service: Gynecology;  Laterality: N/A;  . ESOPHAGOGASTRODUODENOSCOPY (EGD) WITH PROPOFOL N/A 01/28/2018   Procedure: ESOPHAGOGASTRODUODENOSCOPY (EGD) WITH BIOPSIES;  Surgeon: Virgel Manifold, MD;  Location: [place];  Service: Endoscopy;  Laterality: N/A;  . [place] RESECTION  2012  . LAPAROSCOPY N/A 06/16/2015   Procedure: LAPAROSCOPY DIAGNOSTIC;  Surgeon: Brien Few, MD;  Location: [facility];  Service: Gynecology;  Laterality: N/A;  . LAPAROSCOPY N/A 04/01/2018   Procedure: LAPAROSCOPY DIAGNOSTIC ERAS PATHWAY ENTEROLYSIS, CECOPEXY;  Surgeon: Johnathan Hausen, MD;  Location: [facility];  Service: General;  Laterality: N/A;  . LITHOTRIPSY  12/11/2017   laser lithotripsy  . LYSIS OF ADHESION N/A 06/16/2015   Procedure: LYSIS OF ADHESION;  Surgeon: Brien Few, MD;  Location: [facility];  Service: Gynecology;  Laterality: N/A;  . PLANTAR FASCIA SURGERY Bilateral   . ROBOTIC ASSISTED SALPINGO OOPHERECTOMY Right 06/16/2015   Procedure: ROBOTIC ASSISTED SALPINGO OOPHORECTOMY, EXCISION OF RIGHT CUL DE [place] MASS;  Surgeon: Brien Few, MD;  Location: [facility];  Service: Gynecology;  Laterality: Right;  . TONSILLECTOMY    . UPPER GI ENDOSCOPY      Family History  Problem Relation Age of Onset  . Heart attack Mother   . Stroke Mother   . Multiple myeloma Mother   . Hyperlipidemia Father   . Heart attack Sister   . Heart attack Maternal Uncle   . Heart attack Paternal Grandfather   . Breast cancer Paternal Aunt   . Breast cancer Paternal Grandmother   . Lung cancer Maternal Grandfather     Social History   Tobacco Use  . Smoking status: Former Smoker    Packs/day: 1.00    Years: 10.00    Pack years: 10.00    Types: Cigarettes    Quit date: 02/19/1993    Years since quitting: 26.4  . Smokeless tobacco: Never Used  Substance Use Topics  . Alcohol use: Yes    Alcohol/week: 0.0 standard drinks    Comment: occasionally     Current  Outpatient Medications:  .  Accu-Chek FastClix Lancets MISC, USE TO TEST UP TO 4 TIMES A DAY, Disp: , Rfl:  .  ACCU-CHEK GUIDE test strip, , Disp: , Rfl:  .  ADDERALL XR 30 MG 24 hr capsule, Take 30 mg by mouth at bedtime., Disp: , Rfl:  .  albuterol (VENTOLIN HFA) 108 (90 Base) MCG/ACT inhaler, TAKE 2 PUFFS EVERY 6 HOURS AS NEEDED FOR WHEEZING OR SHORTNESS OF BREATH. (VENTOLIN NOT COVERED), Disp: 8.5 g, Rfl: 2 .  Ascorbic Acid (VITAMIN C) 1000 MG tablet, Take 1,000 mg by mouth., Disp: , Rfl:  .  aspirin EC 81 MG tablet, Take 81 mg by mouth daily., Disp: , Rfl:  .  atorvastatin (LIPITOR) 10 MG tablet, Take 1 tablet (10 mg total) by mouth daily., Disp: 90 tablet, Rfl: 1 .  calcium carbonate (OS-CAL) 600 MG TABS tablet, Take 600 mg by mouth daily with breakfast., Disp: , Rfl:  .  clobetasol ointment (TEMOVATE) 4.17 %, Apply 1 application topically daily as needed (psoriasis). , Disp: , Rfl: 0 .  cyanocobalamin (,VITAMIN B-12,) 1000 MCG/ML  injection, INJECT 1 ML (1,000 MCG TOTAL) INTO THE MUSCLE EVERY 14 (FOURTEEN) DAYS., Disp: 6 mL, Rfl: 0 .  cyclobenzaprine (FLEXERIL) 10 MG tablet, Take 1 tablet (10 mg total) by mouth at bedtime. Prn (Patient taking differently: Take 10 mg by mouth at bedtime as needed (migraine). ), Disp: 90 tablet, Rfl: 1 .  dicyclomine (BENTYL) 10 MG capsule, TAKE 1 CAPSULE (10 MG TOTAL) BY MOUTH 4 (FOUR) TIMES DAILY - BEFORE MEALS AND AT BEDTIME., Disp: 90 capsule, Rfl: 1 .  fluconazole (DIFLUCAN) 150 MG tablet, Take 1 tablet (150 mg total) by mouth every other day., Disp: 9 tablet, Rfl: 2 .  fluocinonide (LIDEX) 0.05 % external solution, APPLY TO AFFECTED SCALP TWICE DAILY UNTIL CLEAR, THEN AS NEEDED, Disp: , Rfl:  .  ketoconazole (NIZORAL) 2 % shampoo, PLEASE SEE ATTACHED FOR DETAILED DIRECTIONS, Disp: , Rfl:  .  Lactobacillus (ACIDOPHILUS PO), Take 1 tablet by mouth daily., Disp: , Rfl:  .  loratadine (CLARITIN) 10 MG tablet, Take 10 mg by mouth daily., Disp: , Rfl:  .   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 , Disp: , Rfl: 4 .  mometasone (NASONEX) 50 MCG/ACT nasal spray, USE 2 SPRAYS IN EACH NOSTRIL AT BEDTIME AS NEEDED FOR ALLERGIES, Disp: 17 g, Rfl: 1 .  montelukast (SINGULAIR) 10 MG tablet, TAKE 1 TABLET (10 MG TOTAL) BY MOUTH AT BEDTIME. TAKE 1 TABLET(10 MG) BY MOUTH AT BEDTIME, Disp: 90 tablet, Rfl: 3 .  omeprazole (PRILOSEC) 40 MG capsule, TAKE 1 CAPSULE (40 MG TOTAL) BY MOUTH 2 (TWO) TIMES DAILY BEFORE A MEAL., Disp: 180 capsule, Rfl: 0 .  ondansetron (ZOFRAN-ODT) 4 MG disintegrating tablet, Take by mouth., Disp: , Rfl:  .  pregabalin (LYRICA) 50 MG capsule, Take 1 capsule (50 mg total) by mouth 3 (three) times daily., Disp: 90 capsule, Rfl: 0 .  promethazine (PHENERGAN) 12.5 MG tablet, TAKE 1 TABLET BY MOUTH EVERY 6 HOURS AS NEEDED FOR MIGRAINES WITH NAUSEA, Disp: 20 tablet, Rfl: 0 .  Semaglutide,0.25 or 0.'5MG'$ /DOS, (OZEMPIC, 0.25 OR 0.5 MG/DOSE,) 2 MG/1.5ML SOPN, Inject 0.5 mg into the skin once a week., Disp: 2 pen, Rfl: 0 .  SYRINGE-NEEDLE, DISP, 3 ML (SAFETY SYRINGES/NEEDLE) 20G X 1-1/2" 3 ML MISC, 1 each by Does not apply route every 30 (thirty) days., Disp: 50 each, Rfl: 0 .  traZODone (DESYREL) 100 MG tablet, Take 100-200 mg by mouth at bedtime as needed., Disp: , Rfl:  .  TRINTELLIX 20 MG TABS tablet, Take 20 mg by mouth every morning. , Disp: , Rfl:  .  Vitamin D, Ergocalciferol, (DRISDOL) 1.25 MG (50000 UNIT) CAPS capsule, TAKE 1 CAPSULE (50,000 UNITS TOTAL) BY MOUTH EVERY 7 (SEVEN) DAYS. TAKE 1 CAPSULE EVERY 7 DAYS, Disp: 12 capsule, Rfl: 1 .  zonisamide (ZONEGRAN) 50 MG capsule, Take 150 capsules by mouth daily. , Disp: , Rfl: 2  Allergies  Allergen Reactions  . Aspartame And Phenylalanine Nausea Only and Other (See Comments)    GI upset   . Gabapentin     Bladder incontinence at higher dose  . Linzess [Linaclotide]     Worsening of diarrhea  . Other Hives and Other (See  Comments)    Ricotta Cheese: flushed and hot  . Adhesive [Tape] Rash    Some clear tapes  . Cymbalta [Duloxetine Hcl] Palpitations    And photosensitivity  . Tapentadol Rash    Some clear tapes    I personally  reviewed active problem list, medication list, allergies, family history, social history, health maintenance with the patient/caregiver today.   ROS  Ten systems reviewed and is negative except as mentioned in HPI   Objective  Vitals:   07/15/19 0848  BP: 118/72  Pulse: 81  Resp: 16  Temp: (!) 97.1 F (36.2 C)  TempSrc: Temporal  SpO2: 98%  Weight: 185 lb 9.6 oz (84.2 kg)  Height: _0  (1.549 m)    Body mass index is 35.07 kg/m.  Physical Exam  Constitutional: Patient appears well-developed and well-nourished. Obese  No distress.  HEENT: head atraumatic, normocephalic, pupils equal and reactive to light,, neck supple Cardiovascular: Normal rate, regular rhythm and normal heart sounds.  No murmur heard. No BLE edema. Pulmonary/Chest: Effort normal and breath sounds normal. No respiratory distress. Abdominal: Soft.  There is no tenderness. Psychiatric: Patient has a normal mood and affect. behavior is normal. Judgment and thought content normal.  Recent Results (from the past 2160 hour(s))  POCT HgB A1C     Status: Normal   Collection Time: 04/20/19  9:07 AM  Result Value Ref Range   Hemoglobin A1C 5.1 4.0 - 5.6 %   HbA1c POC (<> result, manual entry)     HbA1c, POC (prediabetic range)     HbA1c, POC (controlled diabetic range)    CBC with Differential/Platelet     Status: None   Collection Time: 04/20/19 10:12 AM  Result Value Ref Range   WBC 7.7 3.4 - 10.8 x10E3/uL   RBC 4.30 3.77 - 5.28 x10E6/uL   Hemoglobin 12.7 11.1 - 15.9 g/dL   Hematocrit 37.6 34.0 - 46.6 %   MCV 87 79 - 97 fL   MCH 29.5 26.6 - 33.0 pg   MCHC 33.8 31.5 - 35.7 g/dL   RDW 12.4 11.7 - 15.4 %   Platelets 379 150 - 450 x10E3/uL   Neutrophils 75 Not Estab. %   Lymphs 16 Not  Estab. %   Monocytes 8 Not Estab. %   Eos 1 Not Estab. %   Basos 0 Not Estab. %   Neutrophils Absolute 5.7 1.4 - 7.0 x10E3/uL   Lymphocytes Absolute 1.3 0.7 - 3.1 x10E3/uL   Monocytes Absolute 0.6 0.1 - 0.9 x10E3/uL   EOS (ABSOLUTE) 0.1 0.0 - 0.4 x10E3/uL   Basophils Absolute 0.0 0.0 - 0.2 x10E3/uL   Immature Granulocytes 0 Not Estab. %   Immature Grans (Abs) 0.0 0.0 - 0.1 x10E3/uL  B12 and Folate Panel     Status: None   Collection Time: 04/20/19 10:12 AM  Result Value Ref Range   Vitamin B-12 808 232 - 1,245 pg/mL   Folate 7.8 >3.0 ng/mL    Comment: A serum folate concentration of less than 3.1 ng/mL is considered to represent clinical deficiency.   TSH     Status: Abnormal   Collection Time: 04/20/19 10:12 AM  Result Value Ref Range   TSH 0.018 (L) 0.450 - 4.500 uIU/mL  T4     Status: Abnormal   Collection Time: 04/20/19 10:12 AM  Result Value Ref Range   T4, Total 16.2 (H) 4.5 - 12.0 ug/dL  T3     Status: None   Collection Time: 04/20/19 10:12 AM  Result Value Ref Range   T3, Total 178 71 - 180 ng/dL  Thyroid peroxidase antibody     Status: Abnormal   Collection Time: 04/20/19 10:12 AM  Result Value Ref Range   Thyroperoxidase Ab SerPl-aCnc 105 (H) 0 - 34  IU/mL  Specimen status report     Status: None   Collection Time: 04/20/19 10:12 AM  Result Value Ref Range   specimen status report Comment     Comment: Written Authorization Written Authorization Written Authorization Received. Authorization received from [employer] 04-23-2019 Logged by Kurtis Bushman      PHQ2/9: Depression screen [facility] 2/9 07/15/2019 06/04/2019 06/02/2019 04/20/2019 01/07/2019  Decreased Interest 0 0 0 0 0  Down, Depressed, Hopeless 0 0 0 0 0  PHQ - 2 Score 0 0 0 0 0  Altered sleeping 0 - 0 1 0  Tired, decreased energy 2 - 0 1 1  Change in appetite 0 - 0 0 0  Feeling bad or failure about yourself  0 - 0 0 0  Trouble concentrating 2 - 0 1 1  Moving slowly or fidgety/restless 0 - 0 0 0   Suicidal thoughts 0 - 0 0 0  PHQ-9 Score 4 - 0 3 2  Difficult doing work/chores Somewhat difficult - Not difficult at all Somewhat difficult Somewhat difficult  Some recent data might be hidden    phq 9 is negative  Fall Risk: Fall Risk  07/15/2019 06/18/2019 06/04/2019 06/02/2019 04/20/2019  Falls in the past year? 0 0 0 0 0  Number falls in past yr: 0 - - 0 0  Injury with Fall? 0 - - 0 0  Follow up Falls evaluation completed - - - -     Functional Status Survey: Is the patient deaf or have difficulty hearing?: No Does the patient have difficulty seeing, even when wearing glasses/contacts?: No Does the patient have difficulty concentrating, remembering, or making decisions?: Yes Does the patient have difficulty walking or climbing stairs?: No Does the patient have difficulty dressing or bathing?: No Does the patient have difficulty doing errands alone such as visiting a doctor's office or shopping?: No    Assessment & Plan  1. History of COVID-19  Still having sob and using oxygen, we will refer to pulmonologist   2. Pneumonia due to COVID-19 virus   3. Hospital discharge follow-up  Reviewed records from [facility] and medication list , she is still having some mental fogginess, she will try going back to work next week and monitor for mistakes or increase in fatigue , FMLA papers filled out    4. Hypoxia

## 2019-07-17 ENCOUNTER — Other Ambulatory Visit: Payer: Self-pay | Admitting: Family Medicine

## 2019-07-17 DIAGNOSIS — M797 Fibromyalgia: Secondary | ICD-10-CM

## 2019-07-17 NOTE — Telephone Encounter (Signed)
Requested medication (s) are due for refill today: yes  Requested medication (s) are on the active medication list: yes  Last refill:  06/02/19 #90  Future visit scheduled: yes  Notes to clinic:  Refill not delegated per protocol    Requested Prescriptions  Pending Prescriptions Disp Refills   pregabalin (LYRICA) 50 MG capsule [Pharmacy Med Name: PREGABALIN 50 MG CAPSULE] 90 capsule 0    Sig: Take 1 capsule (50 mg total) by mouth 3 (three) times daily.      Not Delegated - Neurology:  Anticonvulsants - Controlled Failed - 07/17/2019  9:38 AM      Failed - This refill cannot be delegated      Passed - Valid encounter within last 12 months    Recent Outpatient Visits           2 days ago History of [facility] Steele Sizer, MD   1 month ago [facility] Steele Sizer, MD   2 months ago Diet-controlled diabetes mellitus [facility])   [facility] Steele Sizer, MD   6 months ago Syncope and collapse   [facility] Steele Sizer, MD   7 months ago Diet-controlled diabetes mellitus [facility])   [facility] Steele Sizer, MD       Future Appointments             In 4 days Steele Sizer, MD [facility], [facility]

## 2019-07-21 ENCOUNTER — Other Ambulatory Visit: Payer: Self-pay

## 2019-07-21 ENCOUNTER — Encounter: Payer: Self-pay | Admitting: Family Medicine

## 2019-07-21 ENCOUNTER — Ambulatory Visit: Payer: 59 | Admitting: Family Medicine

## 2019-07-21 VITALS — BP 90/68 | HR 89 | Temp 98.3°F | Resp 16 | Ht 61.0 in | Wt 187.8 lb

## 2019-07-21 DIAGNOSIS — R42 Dizziness and giddiness: Secondary | ICD-10-CM

## 2019-07-21 DIAGNOSIS — E069 Thyroiditis, unspecified: Secondary | ICD-10-CM

## 2019-07-21 DIAGNOSIS — G43109 Migraine with aura, not intractable, without status migrainosus: Secondary | ICD-10-CM

## 2019-07-21 DIAGNOSIS — I700 Atherosclerosis of aorta: Secondary | ICD-10-CM | POA: Diagnosis not present

## 2019-07-21 DIAGNOSIS — E119 Type 2 diabetes mellitus without complications: Secondary | ICD-10-CM | POA: Diagnosis not present

## 2019-07-21 DIAGNOSIS — J4530 Mild persistent asthma, uncomplicated: Secondary | ICD-10-CM

## 2019-07-21 DIAGNOSIS — K219 Gastro-esophageal reflux disease without esophagitis: Secondary | ICD-10-CM

## 2019-07-21 DIAGNOSIS — M797 Fibromyalgia: Secondary | ICD-10-CM | POA: Diagnosis not present

## 2019-07-21 DIAGNOSIS — E538 Deficiency of other specified B group vitamins: Secondary | ICD-10-CM

## 2019-07-21 DIAGNOSIS — F5104 Psychophysiologic insomnia: Secondary | ICD-10-CM

## 2019-07-21 LAB — POCT GLYCOSYLATED HEMOGLOBIN (HGB A1C): Hemoglobin A1C: 5.4 % (ref 4.0–5.6)

## 2019-07-21 MED ORDER — PREGABALIN 100 MG PO CAPS: 100.0000 mg | ORAL_CAPSULE | Freq: Three times a day (TID) | ORAL | 0 refills | Status: DC

## 2019-07-21 MED ORDER — "BD LUER-LOK SYRINGE 25G X 1"" 3 ML MISC": 1.0000 | 0 refills | Status: DC

## 2019-07-21 NOTE — Progress Notes (Signed)
Name: Claire [name]   MRN: [id number]    DOB: 21-Mar-1970   Date:07/21/2019       Progress Note  Subjective  Chief Complaint  Chief Complaint  Patient presents with   Follow-up    Fibromyalgia   Medication Refill   Diabetes    HPI  Major Depression:she is now seeing Dr. Nicolasa Ducking who is also giving her therapy.  She is off Zolot and is currently on Trintelix 20 mg and off Lunesta and taking Trazodone for sleep and is sleeping well now .  She had side effects with Ambien ( interrupted sleep and sleep walking) , or Temazepam ( groggy ).She is following Dr. Waylan Boga recommendation . Asthma MildIntermittentt:she has been using albuterol more often because the mask makes her feel suffocated. She also had COVID infection, she has been using oxygen at home, pulse ox has been in the mid 90's when resting, but can drop to 91 % at times. She has some wheezing in the morning, no coughing lately   Migraine: she is still seeing Dr. Domingo Cocking - she is on  Zonegran, also taking promethazine . She is doing better now, she has on average 2 episodes per month, she has tension headaches intermittent    DMII: diagnosed at age 55, took medication for a while, but stopped all medications 10/25/2010 after bariatric surgery,glucose at home has been well controlled, no polyphagia, polyuria or polydipsia. Maximum weight of 265 lbs and wasdown to 118.3lbs - lowest weight back in 08/2017),she is now gaining weight since she is feeling better after umbilical hernia repair, lysis of adhesions and cecopexy back in 04/01/2018.Weight is up to 187 lbs today  She is back on Ozempic but had gaps because she has been sick.   IBS/GERD history of bypass surgery:  She was doing well after  lysis of adhesions and cecopexy back in 04/01/2018.However recently had to go back to [facility] with severe abdominal pain, she has since seen GI and was switched from Pantoprazole to Omeprazole and was given Bentyl o take prn before  meals. She was advised to see surgeon to see if she needs hiatal hernia repair, she states seen by Dr. Hassell Done and will have a bariatric surgery revision and repair the hiatal hernia. She is still having GERD symptoms , she is waiting for a nutritional appointment visit to have it approved.   Atherosclerosis of aorta and hyperlipidemia: on statin therapy, no side effects, no chest pain or palpitation. Stable   B12 deficiency: shehas been getting B12 injections weekly since Nov and last level was at goal at 808. Continue current dose   Thyroiditis: she had a neck lump that decreased in size at the time T4 was high and TSH suppressed, we will recheck today.   Dizziness/syncope: seen at [facility], and has seen Dr. Margarito Courser 03/2019 , they ruled out POTS but may have had vasovagal syncope. Today her bp is very low, she states she is trying to get up slowly from her bed. Drinking plenty of fluids.   Neck mass: she noticed a lump that is tender on right side of her neck back in Feb , she went to ENT but the time she was seen the swelling was down, he thought it was thyroiditis.   History of COVID: she states over the weekend her pulse ox dropped to 91 % when at a friends house. She is scheduled to go back to work tomorrow, she can work from home, she states still needs a nap  intermittently during the day . 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   FMS: she is still having pain, but she noticed improvement of pain, she is on Lyrica 100 mg at night and 50 in am, we will adjust the dose today    Patient Active Problem List   Diagnosis Date Noted   B12 deficiency 04/10/2018   Columnar epithelial-lined lower esophagus    Postprocedural disorder of digestive system    Pain, abdominal, epigastric    Esophageal dysphagia    Generalized abdominal pain    Persistent mood (affective) disorder, unspecified ([place]) 01/15/2018   Insomnia  related to another mental disorder 01/15/2018   OCD (obsessive compulsive disorder) 10/29/2017   GAD (generalized anxiety disorder) 10/29/2017   Nephrolithiasis 10/22/2017   Atherosclerosis of aorta ([place]) 12/21/2016   Sacroiliac inflammation ([place]) 11/19/2016   Hiatal hernia 08/17/2016   Diverticulosis of colon 08/17/2016   Iron deficiency anemia 08/08/2015   Lumbosacral pain 01/07/2015   Muscle twitching 01/07/2015   Intertrigo 11/05/2014   Perennial allergic rhinitis 09/03/2014   Lung nodules 09/03/2014   Vitamin D deficiency 09/03/2014   Diet-controlled diabetes mellitus ([place]) 09/03/2014   History of kidney stones 09/03/2014   Chronic insomnia 09/03/2014   History of iron deficiency anemia 09/03/2014   Depression with anxiety 09/03/2014   Palpitations 06/22/2014   Bariatric surgery status 12/03/2013   Asthma, mild persistent 11/21/2012   CN (constipation) 11/21/2012   GERD without esophagitis 11/21/2012   History of hyperlipidemia 11/21/2012   Migraine with aura and without status migrainosus 11/21/2012   History of sleep apnea 11/21/2012   IBS (irritable bowel syndrome) 08/15/2012    Past Surgical History:  Procedure Laterality Date   BLADDER SURGERY  2010   BREAST BIOPSY Right 2014   stereotatic biopsy   CHOLECYSTECTOMY  2010   COLONOSCOPY  2014    Done at [place] N/A 01/28/2018   Procedure: COLONOSCOPY WITH PROPOFOL;  Surgeon: Virgel Manifold, MD;  Location: [place] [facility];  Service: Endoscopy;  Laterality: N/A;   DILITATION & CURRETTAGE/HYSTROSCOPY WITH NOVASURE ABLATION N/A 06/16/2015   Procedure: DILATATION & CURETTAGE/HYSTEROSCOPY WITH NOVASURE ABLATION;  Surgeon: Brien Few, MD;  Location: [facility];  Service: Gynecology;  Laterality: N/A;   ESOPHAGOGASTRODUODENOSCOPY (EGD) WITH PROPOFOL N/A 01/28/2018   Procedure: ESOPHAGOGASTRODUODENOSCOPY (EGD) WITH BIOPSIES;  Surgeon: Virgel Manifold, MD;  Location: [place] [facility];  Service: Endoscopy;  Laterality: N/A;   LAPAROSCOPIC GASTRIC SLEEVE RESECTION  2012   LAPAROSCOPY N/A 06/16/2015   Procedure: LAPAROSCOPY DIAGNOSTIC;  Surgeon: Brien Few, MD;  Location: [facility];  Service: Gynecology;  Laterality: N/A;   LAPAROSCOPY N/A 04/01/2018   Procedure: LAPAROSCOPY DIAGNOSTIC ERAS PATHWAY ENTEROLYSIS, CECOPEXY;  Surgeon: Johnathan Hausen, MD;  Location: [facility];  Service: General;  Laterality: N/A;   LITHOTRIPSY  12/11/2017   laser lithotripsy   LYSIS OF ADHESION N/A 06/16/2015   Procedure: LYSIS OF ADHESION;  Surgeon: Brien Few, MD;  Location: [facility];  Service: Gynecology;  Laterality: N/A;   PLANTAR FASCIA SURGERY Bilateral    ROBOTIC ASSISTED SALPINGO OOPHERECTOMY Right 06/16/2015   Procedure: ROBOTIC ASSISTED SALPINGO OOPHORECTOMY, EXCISION OF RIGHT CUL DE [place] MASS;  Surgeon: Brien Few, MD;  Location: [facility];  Service: Gynecology;  Laterality: Right;   TONSILLECTOMY     UPPER GI ENDOSCOPY      Family History  Problem Relation Age of Onset  Heart attack Mother    Stroke Mother    Multiple myeloma Mother    Hyperlipidemia Father    Heart attack Sister    Heart attack Maternal Uncle    Heart attack Paternal Grandfather    Breast cancer Paternal Aunt    Breast cancer Paternal Grandmother    Lung cancer Maternal Grandfather     Social History   Tobacco Use   Smoking status: Former Smoker    Packs/day: 1.00    Years: 10.00    Pack years: 10.00    Types: Cigarettes    Quit date: 02/19/1993    Years since quitting: 26.4   Smokeless tobacco: Never Used  Substance Use Topics   Alcohol use: Yes    Alcohol/week: 0.0 standard drinks    Comment: occasionally     Current Outpatient Medications:    Accu-Chek FastClix Lancets MISC, USE TO TEST UP TO 4 TIMES A DAY, Disp: , Rfl:    ACCU-CHEK GUIDE test strip, , Disp: , Rfl:    ADDERALL XR 30 MG 24 hr capsule, Take 30 mg by mouth daily.  , Disp: , Rfl:    albuterol (VENTOLIN HFA) 108 (90 Base) MCG/ACT inhaler, TAKE 2 PUFFS EVERY 6 HOURS AS NEEDED FOR WHEEZING OR SHORTNESS OF BREATH. (VENTOLIN NOT COVERED), Disp: 8.5 g, Rfl: 2   Ascorbic Acid (VITAMIN C) 1000 MG tablet, Take 1,000 mg by mouth., Disp: , Rfl:    atorvastatin (LIPITOR) 10 MG tablet, Take 1 tablet (10 mg total) by mouth daily., Disp: 90 tablet, Rfl: 1   calcium carbonate (OS-CAL) 600 MG TABS tablet, Take 600 mg by mouth daily with breakfast., Disp: , Rfl:    clobetasol ointment (TEMOVATE) 8.67 %, Apply 1 application topically daily as needed (psoriasis). , Disp: , Rfl: 0   cyanocobalamin (,VITAMIN B-12,) 1000 MCG/ML injection, INJECT 1 ML (1,000 MCG TOTAL) INTO THE MUSCLE EVERY 14 (FOURTEEN) DAYS., Disp: 6 mL, Rfl: 0   cyclobenzaprine (FLEXERIL) 10 MG tablet, Take 1 tablet (10 mg total) by mouth at bedtime. Prn (Patient taking differently: Take 10 mg by mouth at bedtime as needed (migraine). ), Disp: 90 tablet, Rfl: 1   dicyclomine (BENTYL) 10 MG capsule, TAKE 1 CAPSULE (10 MG TOTAL) BY MOUTH 4 (FOUR) TIMES DAILY - BEFORE MEALS AND AT BEDTIME., Disp: 90 capsule, Rfl: 1   fluconazole (DIFLUCAN) 150 MG tablet, Take 1 tablet (150 mg total) by mouth every other day., Disp: 9 tablet, Rfl: 2   fluocinonide (LIDEX) 0.05 % external solution, APPLY TO AFFECTED SCALP TWICE DAILY UNTIL CLEAR, THEN AS NEEDED, Disp: , Rfl:    ketoconazole (NIZORAL) 2 % shampoo, PLEASE SEE ATTACHED FOR DETAILED DIRECTIONS, Disp: , Rfl:    Lactobacillus (ACIDOPHILUS PO), Take 1 tablet by mouth daily., Disp: , Rfl:    loratadine (CLARITIN) 10 MG tablet, Take 10 mg by mouth daily., Disp: , Rfl:    mometasone (ELOCON) 0.1 % lotion, Apply 1 application topically daily as needed (psoriasis in the ears). , Disp: , Rfl: 4   mometasone (NASONEX) 50 MCG/ACT nasal spray, USE 2 SPRAYS IN EACH NOSTRIL AT BEDTIME AS NEEDED FOR ALLERGIES, Disp: 17 g, Rfl: 1   montelukast (SINGULAIR) 10 MG tablet, TAKE  1 TABLET (10 MG TOTAL) BY MOUTH AT BEDTIME. TAKE 1 TABLET(10 MG) BY MOUTH AT BEDTIME, Disp: 90 tablet, Rfl: 3   omeprazole (PRILOSEC) 40 MG capsule, TAKE 1 CAPSULE (40 MG TOTAL) BY MOUTH 2 (TWO) TIMES DAILY BEFORE A MEAL., Disp: 180 capsule,  Rfl: 0   ondansetron (ZOFRAN-ODT) 4 MG disintegrating tablet, Take by mouth., Disp: , Rfl:    pregabalin (LYRICA) 50 MG capsule, TAKE 1 CAPSULE (50 MG TOTAL) BY MOUTH 3 (THREE) TIMES DAILY., Disp: 90 capsule, Rfl: 0   promethazine (PHENERGAN) 12.5 MG tablet, TAKE 1 TABLET BY MOUTH EVERY 6 HOURS AS NEEDED FOR MIGRAINES WITH NAUSEA, Disp: 20 tablet, Rfl: 0   Semaglutide,0.25 or 0.5MG/DOS, (OZEMPIC, 0.25 OR 0.5 MG/DOSE,) 2 MG/1.5ML SOPN, Inject 0.5 mg into the skin once a week., Disp: 2 pen, Rfl: 0   SYRINGE-NEEDLE, DISP, 3 ML (SAFETY SYRINGES/NEEDLE) 20G X 1-1/2" 3 ML MISC, 1 each by Does not apply route every 30 (thirty) days., Disp: 50 each, Rfl: 0   traZODone (DESYREL) 100 MG tablet, Take 100-200 mg by mouth at bedtime as needed., Disp: , Rfl:    TRINTELLIX 20 MG TABS tablet, Take 20 mg by mouth every morning. , Disp: , Rfl:    Vitamin D, Ergocalciferol, (DRISDOL) 1.25 MG (50000 UNIT) CAPS capsule, TAKE 1 CAPSULE (50,000 UNITS TOTAL) BY MOUTH EVERY 7 (SEVEN) DAYS. TAKE 1 CAPSULE EVERY 7 DAYS, Disp: 12 capsule, Rfl: 1   zonisamide (ZONEGRAN) 50 MG capsule, Take 150 capsules by mouth daily. , Disp: , Rfl: 2   aspirin EC 81 MG tablet, Take 81 mg by mouth daily., Disp: , Rfl:   Allergies  Allergen Reactions   Aspartame And Phenylalanine Nausea Only and Other (See Comments)    GI upset    Gabapentin     Bladder incontinence at higher dose   Linzess [Linaclotide]     Worsening of diarrhea   Other Hives and Other (See Comments)    Ricotta Cheese: flushed and hot   Adhesive [Tape] Rash    Some clear tapes   Cymbalta [Duloxetine Hcl] Palpitations    And photosensitivity   Tapentadol Rash    Some clear tapes    I personally reviewed active  problem list, medication list, allergies, family history, social history, health maintenance with the patient/caregiver today.   ROS  Constitutional: Negative for fever or weight change.  Respiratory: Negative for cough and shortness of breath.   Cardiovascular: Negative for chest pain or palpitations.  Gastrointestinal:positive for intermittent abdominal pain, no bowel changes.  Musculoskeletal: Negative for gait problem or joint swelling.  Skin: Negative for rash.  Neurological: Positive  for intermittent dizziness or headache.  No other specific complaints in a complete review of systems (except as listed in HPI above).  Objective  Vitals:   07/21/19 0911  BP: 90/68  Pulse: 89  Resp: 16  Temp: 98.3 F (36.8 C)  TempSrc: Temporal  SpO2: 98%  Weight: 187 lb 12.8 oz (85.2 kg)  Height: 5' 1" (1.549 m)    Body mass index is 35.48 kg/m.  Physical Exam  Constitutional: Patient appears well-developed and well-nourished. Obese  No distress.  HEENT: head atraumatic, normocephalic, pupils equal and reactive to light Cardiovascular: Normal rate, regular rhythm and normal heart sounds.  No murmur heard. No BLE edema. Pulmonary/Chest: Effort normal and breath sounds normal. No respiratory distress. Abdominal: Soft.  There is no tenderness. Psychiatric: Patient has a normal mood and affect. behavior is normal. Judgment and thought content normal.   PHQ2/9: Depression screen [facility] 2/9 07/21/2019 07/15/2019 06/04/2019 06/02/2019 04/20/2019  Decreased Interest 0 0 0 0 0  Down, Depressed, Hopeless 0 0 0 0 0  PHQ - 2 Score 0 0 0 0 0  Altered sleeping 0 0 - 0 1  Tired, decreased energy 0 2 - 0 1  Change in appetite 0 0 - 0 0  Feeling bad or failure about yourself  0 0 - 0 0  Trouble concentrating 0 2 - 0 1  Moving slowly or fidgety/restless 0 0 - 0 0  Suicidal thoughts 0 0 - 0 0  PHQ-9 Score 0 4 - 0 3  Difficult doing work/chores Not difficult at all Somewhat difficult - Not difficult at  all Somewhat difficult  Some recent data might be hidden    phq 9 is negative   Fall Risk: Fall Risk  07/21/2019 07/15/2019 06/18/2019 06/04/2019 06/02/2019  Falls in the past year? 0 0 0 0 0  Number falls in past yr: - 0 - - 0  Injury with Fall? - 0 - - 0  Follow up - Falls evaluation completed - - -    Functional Status Survey: Is the patient deaf or have difficulty hearing?: No Does the patient have difficulty seeing, even when wearing glasses/contacts?: No Does the patient have difficulty concentrating, remembering, or making decisions?: No Does the patient have difficulty walking or climbing stairs?: No Does the patient have difficulty dressing or bathing?: No Does the patient have difficulty doing errands alone such as visiting a doctor's office or shopping?: No    Assessment & Plan  1. Diet-controlled diabetes mellitus ([place])  - POCT HgB A1C  2. Fibromyalgia  - pregabalin (LYRICA) 100 MG capsule; Take 1 capsule (100 mg total) by mouth 3 (three) times daily.  Dispense: 270 capsule; Refill: 0  3. B12 deficiency  - SYRINGE-NEEDLE, DISP, 3 ML (B-D 3CC LUER-LOK SYR 25GX1") 25G X 1" 3 ML MISC; 1 each by Does not apply route every 14 (fourteen) days.  Dispense: 100 each; Refill: 0  4. Atherosclerosis of aorta ([facility])  Continue statin therapy   5. Dizziness   6. Mild persistent asthma without complication  Stable  7. Chronic insomnia  Under the care of Dr. Nicolasa Ducking  8. GERD without esophagitis  stable  9. Migraine with aura and without status migrainosus, not intractable  Continue follow up with neurologist   10. Thyroiditis  - TSH + free T4

## 2019-07-22 ENCOUNTER — Telehealth: Payer: Self-pay | Admitting: Dietician

## 2019-07-22 LAB — T4, FREE: Free T4: 1.1 ng/dL (ref 0.8–1.8)

## 2019-07-22 LAB — TSH+FREE T4: TSH W/REFLEX TO FT4: 5.94 mIU/L — ABNORMAL HIGH

## 2019-07-22 NOTE — Telephone Encounter (Signed)
Returned message from patient to reschedule appointments missed due to COVID illness and hospitalization. Rescheduled four supervised weight loss virtual visits as well as pre-op class.

## 2019-07-23 ENCOUNTER — Encounter: Payer: Self-pay | Admitting: Dietician

## 2019-07-23 ENCOUNTER — Encounter: Payer: Self-pay | Admitting: Family Medicine

## 2019-07-23 ENCOUNTER — Ambulatory Visit: Payer: Self-pay | Admitting: Dietician

## 2019-07-23 NOTE — Progress Notes (Signed)
Appt start time: 1330 end time:  1350.  Assessment:   #3 of 6 SWL Appointment.   Start Wt at NDES: 189.8lbs Wt: 185.6lbs Ht: 5'1" BMI: 35.07   Learning Readiness:   Change in progress  MEDICATIONS: Adderall, Albuterol inhaler, atorvastatin, cyclobenzaprine, dicyclomine, bluconazole, lactobacillus, loratadine, montelukast, omeprazole, ondansetron, pregalabin, promthazine, Semaglutide, traZODone, trintellix, zonisamide  Progress:  Patient became ill with COVID at beginning of May; was hospitalized for 1 week and discharged home with supplemental oxygen prn. She reports using 7 liters of oxygen when first home, but now is down to about 2 liters per day.   She is gradually increasing daily physical activity but progress is slow, she is working from home.   She was unable to eat much at all for about 4 days when ill, lost down to about 176lbs, but regained to 185.6lbs when resumed regular bariatric eating pattern.  Dietary intake:  Breakfast: 2 scrambled eggs; once had 1/2 chick fila biscuit + 4 hash rounds  Snack: none or protein bar  Lunch: recently ceviche (shrimp "cooked" in lime juice with onion, tomato, cucumber); [place] burger Snack: none or protein bar Dinner: some hamburger helper meals (husband cooked); fajita with chicken, shrimp, tomato, cheese; venison burger; usually lean protein + 1-2 low-carb vegetables + occasionally risotto or bac and cheese (eats last and usually only a few bites) Snack: none Beverages: water, 1 coke made with cane sugar  Usual physical activity: limited due to recent COVID illness   Diet to Follow: Continue with current eating pattern, adequate nutrition to promote recovery              Nutritional Diagnosis:  [place]-3.3 Overweight/obesity related to history of excess calories and physical inactivity as evidenced by patient current BMI of 35, following dietary guidelines for continued weight loss prior to bariatric surgery revision.               Intervention:   . Nutrition counseling for weight loss prior to upcoming bariatric surgery. . Discussed importance of adequate nutrition for recovery and adjusting eating pattern if needed to consume smaller portions more often during the day to avoid fatigue and shortness of breath.  . Commended patient for continuing to follow bariatric diet guidelines in general.  . Patient will plan to connect with [facility] to discuss tentative surgery date.   Teaching Method Utilized:  Visual Auditory    Barriers to learning/adherence to lifestyle change: none  Demonstrated degree of understanding via:  Teach Back   Monitoring/Evaluation:  Dietary intake, exercise, and body weight 07/27/19 at 1:30pm.

## 2019-07-27 ENCOUNTER — Ambulatory Visit: Payer: Self-pay | Admitting: Dietician

## 2019-07-27 ENCOUNTER — Encounter: Payer: Self-pay | Admitting: Dietician

## 2019-07-27 VITALS — Ht 61.0 in | Wt 187.9 lb

## 2019-07-27 NOTE — Progress Notes (Signed)
Appt start time: 1330 end time:  1345.  Assessment:   #4 SWL Appointment.   Start Wt at NDES: 189.8lbs Wt: 187.9lbs Ht: 5'5" BMI: 35.5   Learning Readiness:   Change in progress  MEDICATIONS: Adderall, Albuterol inhaler, atorvastatin, cyclobenzaprine, dicyclomine, bluconazole, lactobacillus, loratadine, montelukast, omeprazole, ondansetron, pregalabin, promthazine, Semaglutide, traZODone, trintellix, zonisamide   Progress:  Weight gain of 2lbs, likely due to ongoing recovery from illness.   Patient reports she continues to use some supplemental oxygen, but is improving.   Dietary intake:  Breakfast: 2 eggs; 1/2 breakfast sandwich  Snack: none or protein bar  Lunch: ceviche or 1/2 sandwich Snack: same as am Dinner: chicken/ fish/ venison + 1-2 veg, mostly low-carb; no beef Snack: same as am Beverages: water, sugar free beverages  Usual physical activity: gradually increasing, currently 4500 steps daily  Diet to Follow: Continue with current eating patter, adequate nutrition to promote recovery              Nutritional Diagnosis:  [place]-3.3 Overweight/obesity related to history of excess calories and physical inactivity as evidenced by patient current BMI of 35.5, following dietary guidelines for continued weight loss prior to bariatric surgery.              Intervention:   . Nutrition counseling for weight loss prior to upcoming bariatric surgery. . Reviewed progress since previous visit. . Patient continues to recover from recent COVID-19 illness.   Teaching Method Utilized:  Auditory   Barriers to learning/adherence to lifestyle change: none  Demonstrated degree of understanding via:  Teach Back   Monitoring/Evaluation:  Dietary intake, exercise, and body weight 11/03/19.

## 2019-07-29 ENCOUNTER — Telehealth: Payer: Self-pay | Admitting: Dietician

## 2019-08-03 ENCOUNTER — Encounter: Payer: Self-pay | Admitting: Dietician

## 2019-08-03 ENCOUNTER — Ambulatory Visit: Payer: Self-pay | Admitting: Dietician

## 2019-08-03 VITALS — Ht 61.0 in | Wt 190.1 lb

## 2019-08-03 DIAGNOSIS — Z6835 Body mass index (BMI) 35.0-35.9, adult: Secondary | ICD-10-CM

## 2019-08-03 NOTE — Progress Notes (Signed)
Appt start time: 8144 end time:  1725.  Assessment:   #5 SWL Appointment.   Start Wt at NDES: 189.8lbs Wt: 190.1lbs Ht: 5'1" BMI: 35.92   Learning Readiness:   Change in progress  MEDICATIONS:  Adderall, Albuterol inhaler, atorvastatin, cyclobenzaprine, dicyclomine, bluconazole, lactobacillus, loratadine, montelukast, omeprazole, ondansetron, pregalabin, promthazine, Semaglutide, traZODone, trintellix, zonisamide    Progress:  Patient reports increase in weight, possible due to increased swelling in feet and ankles. Fluid retention has increased throughout each day but improves overnight until today. She will discuss with pulmonologist at appointment tomorrow.  She continues to follow bariatric eating pattern and food choices.   Physical activity is gradually increasing.  Dietary intake:  Breakfast: 2 eggs or 1/2 breakfast sandwich  Snack: none or protein bar  Lunch: ceviche or 1/2 sandwich Snack: same as am Dinner: chicken/ fish/ venision + 1-2 vegetables mostly low-carb Snack: same as am Beverages: water, sugar free beverages  Usual physical activity: gradually increasing daily walking/ steps  Diet to Follow: Continue with current eating pattern              Nutritional Diagnosis:  [place]-3.3 Overweight/obesity related to history of excess calories and physical inactivity as evidenced by patient current BMI of 35.92, following dietary guidelines for continued weight loss prior to bariatric surgery revision.              Intervention:    Nutrition counseling for weight loss prior to upcoming bariatric surgery revision.  Reviewed progress since previous visit.   Patient will discuss ankle swelling with pulmonologist tomorrow.   Teaching Method Utilized:  Auditory   Barriers to learning/adherence to lifestyle change: none  Demonstrated degree of understanding via:  Teach Back   Monitoring/Evaluation:  Dietary intake, exercise, and body weight 08/07/19.

## 2019-08-04 ENCOUNTER — Encounter: Payer: Self-pay | Admitting: Pulmonary Disease

## 2019-08-04 ENCOUNTER — Ambulatory Visit: Payer: 59 | Admitting: Pulmonary Disease

## 2019-08-04 ENCOUNTER — Other Ambulatory Visit: Payer: Self-pay

## 2019-08-04 VITALS — BP 110/70 | HR 84 | Temp 98.8°F | Ht 61.0 in | Wt 192.6 lb

## 2019-08-04 DIAGNOSIS — I4940 Unspecified premature depolarization: Secondary | ICD-10-CM | POA: Diagnosis not present

## 2019-08-04 DIAGNOSIS — R011 Cardiac murmur, unspecified: Secondary | ICD-10-CM

## 2019-08-04 DIAGNOSIS — R609 Edema, unspecified: Secondary | ICD-10-CM | POA: Diagnosis not present

## 2019-08-04 DIAGNOSIS — R0602 Shortness of breath: Secondary | ICD-10-CM | POA: Diagnosis not present

## 2019-08-04 NOTE — Progress Notes (Signed)
Subjective:    Patient ID: Claire [name], female    DOB: 04/16/1970, 49 y.o.   MRN: [id number]  HPI Patient is a 49 year old remote former smoker who presents for evaluation of dyspnea following COVID-19 hospitalization and late April/early May at [facility].  She is kindly referred by Steele Sizer MD.  Review of "Care Everywhere" records shows that the patient had chest x-ray that was compatible with multifocal pneumonia typical for COVID-19.  She was on oxygen supplementation up to 7 L/min.  She was discharged home on O2.  Currently she is only using it "as needed".  She does not have a portable source.  She describes no chest pain.  Dyspnea is mostly on exertion.  He has had some orthopnea and some paroxysmal nocturnal dyspnea.  She also has noted lower extremity edema.  In the past she has been told that she has abnormal cardiac rhythm but cannot determine what this was.  She was also told that she had vasovagal syncope and dysautonomia.  Since her Covid diagnosis she has not had any fevers, chills or sweats.  No cough or sputum production.  No other complaint.  She has been told in the past that she has asthma and has only required albuterol inhaler as needed.  She has not required use of this much lately.  Albuterol does not help her current issues with dyspnea.   Review of Systems A 10 point review of systems was performed and it is as noted above otherwise negative.  Past Medical History:  Diagnosis Date  . Anemia   . Anxiety   . Arthritis    hands, hips  . Asthma   . Bilateral leg cramps   . Depression   . Diabetes mellitus without complication ([place])    history no longer a problem since weight loss surgery  . DJD (degenerative joint disease)   . GERD (gastroesophageal reflux disease)   . Headache    Migraines  . IBS (irritable bowel syndrome)   . Kidney stone   . Low serum vitamin B12   . Lung nodules   . Migraine    down to approx 4x/mo since starting meds  . Muscle  spasm   . Psoriasis    vaginal area  . Seasonal allergies   . Sleep apnea    history no longer a problem since weight loss surgery  . Vasovagal syncope    [place], Dr. Alveria Apley   . Vitamin D deficiency    Past Surgical History:  Procedure Laterality Date  . BLADDER SURGERY  2010  . BREAST BIOPSY Right 2014   stereotatic biopsy  . CHOLECYSTECTOMY  2010  . COLONOSCOPY  2014    Done at [facility]  . COLONOSCOPY WITH PROPOFOL N/A 01/28/2018   Procedure: COLONOSCOPY WITH PROPOFOL;  Surgeon: Virgel Manifold, MD;  Location: [place];  Service: Endoscopy;  Laterality: N/A;  . DILITATION & CURRETTAGE/HYSTROSCOPY WITH NOVASURE ABLATION N/A 06/16/2015   Procedure: DILATATION & CURETTAGE/HYSTEROSCOPY WITH NOVASURE ABLATION;  Surgeon: Brien Few, MD;  Location: [facility];  Service: Gynecology;  Laterality: N/A;  . ESOPHAGOGASTRODUODENOSCOPY (EGD) WITH PROPOFOL N/A 01/28/2018   Procedure: ESOPHAGOGASTRODUODENOSCOPY (EGD) WITH BIOPSIES;  Surgeon: Virgel Manifold, MD;  Location: [place];  Service: Endoscopy;  Laterality: N/A;  . [place] RESECTION  2012  . LAPAROSCOPY N/A 06/16/2015   Procedure: LAPAROSCOPY DIAGNOSTIC;  Surgeon: Brien Few, MD;  Location: [facility];  Service: Gynecology;  Laterality: N/A;  . LAPAROSCOPY N/A 04/01/2018  Procedure: LAPAROSCOPY DIAGNOSTIC ERAS PATHWAY ENTEROLYSIS, CECOPEXY;  Surgeon: Johnathan Hausen, MD;  Location: [facility];  Service: General;  Laterality: N/A;  . LITHOTRIPSY  12/11/2017   laser lithotripsy  . LYSIS OF ADHESION N/A 06/16/2015   Procedure: LYSIS OF ADHESION;  Surgeon: Brien Few, MD;  Location: [facility];  Service: Gynecology;  Laterality: N/A;  . PLANTAR FASCIA SURGERY Bilateral   . ROBOTIC ASSISTED SALPINGO OOPHERECTOMY Right 06/16/2015   Procedure: ROBOTIC ASSISTED SALPINGO OOPHORECTOMY, EXCISION OF RIGHT CUL DE [place] MASS;  Surgeon: Brien Few, MD;  Location: [facility];  Service: Gynecology;  Laterality:  Right;  . TONSILLECTOMY    . UPPER GI ENDOSCOPY     Family History  Problem Relation Age of Onset  . Heart attack Mother   . Stroke Mother   . Multiple myeloma Mother   . Hyperlipidemia Father   . Heart attack Sister   . Heart attack Maternal Uncle   . Heart attack Paternal Grandfather   . Breast cancer Paternal Aunt   . Breast cancer Paternal Grandmother   . Lung cancer Maternal Grandfather    Social History   Tobacco Use  . Smoking status: Former Smoker    Packs/day: 1.00    Years: 10.00    Pack years: 10.00    Types: Cigarettes    Quit date: 02/19/1993    Years since quitting: 26.4  . Smokeless tobacco: Never Used  Substance Use Topics  . Alcohol use: Yes    Alcohol/week: 0.0 standard drinks    Comment: occasionally   No military history.  Has resided in [place] all her life.  No occupational exposures.  Works from home.  Allergies  Allergen Reactions  . Aspartame And Phenylalanine Nausea Only and Other (See Comments)    GI upset   . Gabapentin     Bladder incontinence at higher dose  . Linzess [Linaclotide]     Worsening of diarrhea  . Other Hives and Other (See Comments)    Ricotta Cheese: flushed and hot  . Adhesive [Tape] Rash    Some clear tapes  . Cymbalta [Duloxetine Hcl] Palpitations    And photosensitivity  . Tapentadol Rash    Some clear tapes   Current Meds  Medication Sig  . Accu-Chek FastClix Lancets MISC USE TO TEST UP TO 4 TIMES A DAY  . ACCU-CHEK GUIDE test strip   . ADDERALL XR 30 MG 24 hr capsule Take 30 mg by mouth daily.   Marland Kitchen albuterol (VENTOLIN HFA) 108 (90 Base) MCG/ACT inhaler TAKE 2 PUFFS EVERY 6 HOURS AS NEEDED FOR WHEEZING OR SHORTNESS OF BREATH. (VENTOLIN NOT COVERED)  . Ascorbic Acid (VITAMIN C) 1000 MG tablet Take 1,000 mg by mouth.  Marland Kitchen aspirin EC 81 MG tablet Take 81 mg by mouth daily.  Marland Kitchen atorvastatin (LIPITOR) 10 MG tablet Take 1 tablet (10 mg total) by mouth daily.  . calcium carbonate (OS-CAL) 600 MG TABS tablet Take  600 mg by mouth daily with breakfast.  . clobetasol ointment (TEMOVATE) 4.80 % Apply 1 application topically daily as needed (psoriasis).   . cyanocobalamin (,VITAMIN B-12,) 1000 MCG/ML injection INJECT 1 ML (1,000 MCG TOTAL) INTO THE MUSCLE EVERY 14 (FOURTEEN) DAYS.  Marland Kitchen cyclobenzaprine (FLEXERIL) 10 MG tablet Take 1 tablet (10 mg total) by mouth at bedtime. Prn (Patient taking differently: Take 10 mg by mouth at bedtime as needed (migraine). )  . dicyclomine (BENTYL) 10 MG capsule TAKE 1 CAPSULE (10 MG TOTAL) BY MOUTH 4 (FOUR) TIMES DAILY -  BEFORE MEALS AND AT BEDTIME.  . fluconazole (DIFLUCAN) 150 MG tablet Take 1 tablet (150 mg total) by mouth every other day.  . fluocinonide (LIDEX) 0.05 % external solution APPLY TO AFFECTED SCALP TWICE DAILY UNTIL CLEAR, THEN AS NEEDED  . ketoconazole (NIZORAL) 2 % shampoo PLEASE SEE ATTACHED FOR DETAILED DIRECTIONS  . Lactobacillus (ACIDOPHILUS PO) Take 1 tablet by mouth daily.  Marland Kitchen loratadine (CLARITIN) 10 MG tablet Take 10 mg by mouth daily.  . 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   . mometasone (NASONEX) 50 MCG/ACT nasal spray USE 2 SPRAYS IN EACH NOSTRIL AT BEDTIME AS NEEDED FOR ALLERGIES  . montelukast (SINGULAIR) 10 MG tablet TAKE 1 TABLET (10 MG TOTAL) BY MOUTH AT BEDTIME. TAKE 1 TABLET(10 MG) BY MOUTH AT BEDTIME  . pregabalin (LYRICA) 100 MG capsule Take 1 capsule (100 mg total) by mouth 3 (three) times daily.  . promethazine (PHENERGAN) 12.5 MG tablet TAKE 1 TABLET BY MOUTH EVERY 6 HOURS AS NEEDED FOR MIGRAINES WITH NAUSEA  . Semaglutide,0.25 or 0.5MG/DOS, (OZEMPIC, 0.25 OR 0.5 MG/DOSE,) 2 MG/1.5ML SOPN Inject 0.5 mg into the skin once a week.  . SYRINGE-NEEDLE, DISP, 3 ML (B-D 3CC LUER-LOK SYR 25GX1") 25G X 1" 3 ML MISC 1 each by Does not apply route every 14 (fourteen) days.  . traZODone (DESYREL) 100 MG tablet Take 100-200 mg by mouth at bedtime as needed.  . TRINTELLIX 20 MG TABS  tablet Take 20 mg by mouth every morning.   . Vitamin D, Ergocalciferol, (DRISDOL) 1.25 MG (50000 UNIT) CAPS capsule TAKE 1 CAPSULE (50,000 UNITS TOTAL) BY MOUTH EVERY 7 (SEVEN) DAYS. TAKE 1 CAPSULE EVERY 7 DAYS  . zonisamide (ZONEGRAN) 50 MG capsule Take 150 capsules by mouth daily.    Immunization History  Administered Date(s) Administered  . Influenza,inj,Quad PF,6+ Mos 11/05/2014, 11/11/2015, 11/19/2016, 12/02/2017, 12/18/2018  . Influenza-Unspecified 11/25/2013  . Pneumococcal Conjugate-13 11/19/2016  . Pneumococcal Polysaccharide-23 11/02/2011  . Tdap 04/13/2009, 12/18/2018        Objective:   Physical Exam BP 110/70 (BP Location: Left Arm, Cuff Size: Normal)   Pulse 84   Temp 98.8 F (37.1 C) (Temporal)   Ht _0  (1.549 m)   Wt 192 lb 9.6 oz (87.4 kg)   SpO2 97%   BMI 36.39 kg/m   GENERAL: Well-developed, obese, no respiratory distress.  Fully ambulatory, awake and alert. HEAD: Normocephalic, atraumatic.  EYES: Pupils equal, round, reactive to light.  No scleral icterus.  MOUTH: Nose/mouth/throat not examined due to masking requirements for COVID 19. NECK: Supple. No thyromegaly. Trachea midline. No JVD.  No adenopathy. PULMONARY: No adventitious sounds.  Good air entry bilaterally CARDIOVASCULAR: S1 and S2.  Rhythm for the most part regular with frequent extrasystoles and pauses, query sinus arrhythmia.  Grade 1 through 2/6 systolic ejection murmur left sternal border. GASTROINTESTINAL: Truncal obesity MUSCULOSKELETAL: No joint deformity, no clubbing, 1+ edema of the lower extremities (feet/ankles ( NEUROLOGIC: Awake, alert, fully ambulatory with no gait disturbance noted.  Speech is fluent.  Oriented.  No overt focal deficits. SKIN: Intact,warm,dry. PSYCH: Mood and behavior appropriate.  Ambulatory oximetry was performed, patient did not desaturate below 97% during ambulation.     Assessment & Plan:     ICD-10-CM   1. SOB (shortness of breath)  R06.02  ECHOCARDIOGRAM COMPLETE   Ambulatory oximetry does not reveal any desaturations with ambulation Query cardiac etiology  Obtain 2D echo to rule out potential cardiac  etiology  2. Cardiac arrhythmia due to premature depolarization, unspecified type  I49.40 LONG TERM MONITOR (3-14 DAYS)    Ambulatory referral to Cardiology   EKG reveals: Sinus rhythm rate of 72 there are frequent PACs with compensatory pause We will obtain Holter monitor  3. Cardiac murmur  R01.1 Ambulatory referral to Cardiology   2D echo as above for further evaluation  4. Edema, unspecified type  R60.9    2D echo as above for further evaluation  5. Morbid obesity due to excess calories ([place])  E66.01    This issue adds complexity to her management   Discussion:  Patient presents with a complaint of dyspnea and lower extremity edema.  She also has palpitations and orthopnea, paroxysmal nocturnal dyspnea.  On examination today there does not appear to be an active pulmonary issue she does not desaturate with ambulation and lungs are completely clear.  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  Will proceed with obtaining Holter monitor and 2D echo.  For consultation with cardiology.  We will see the patient in follow-up after the studies are completed.   Renold Don, MD [facility]   *This note was dictated using voice recognition software/Dragon.  Despite best efforts to proofread, errors can occur which can change the meaning.  Any change was purely unintentional.

## 2019-08-04 NOTE — Patient Instructions (Signed)
We are going to obtain a Holter monitor (to monitor your cardiac rhythm) and also going to obtain an echocardiogram to evaluate the chambers of your heart.  Sometimes COVID-19 can affect the heart muscle.  You did not qualify for oxygen today which is actually good news.  You do not require supplemental oxygen.  Your lungs appear to be doing well but we need to now evaluate your heart.  We will see you in follow-up after the above studies are completed we will also refer you to the cardiologist.

## 2019-08-05 ENCOUNTER — Ambulatory Visit
Admission: RE | Admit: 2019-08-05 | Discharge: 2019-08-05 | Disposition: A | Discharge status: Home / Self Care | Payer: No Typology Code available for payment source | Source: Ambulatory Visit | Attending: Pulmonary Disease | Admitting: Pulmonary Disease

## 2019-08-05 DIAGNOSIS — R0602 Shortness of breath: Secondary | ICD-10-CM | POA: Diagnosis not present

## 2019-08-05 DIAGNOSIS — R600 Localized edema: Secondary | ICD-10-CM | POA: Diagnosis not present

## 2019-08-05 DIAGNOSIS — E119 Type 2 diabetes mellitus without complications: Secondary | ICD-10-CM | POA: Insufficient documentation

## 2019-08-05 DIAGNOSIS — Z8616 Personal history of COVID-19: Secondary | ICD-10-CM | POA: Insufficient documentation

## 2019-08-05 NOTE — Progress Notes (Signed)
*  PRELIMINARY RESULTS* Echocardiogram 2D Echocardiogram has been performed.  Claire [name] 08/05/2019, 11:45 AM

## 2019-08-06 ENCOUNTER — Ambulatory Visit: Payer: No Typology Code available for payment source | Admitting: Cardiology

## 2019-08-06 ENCOUNTER — Other Ambulatory Visit: Payer: Self-pay

## 2019-08-06 ENCOUNTER — Encounter: Payer: Self-pay | Admitting: Cardiology

## 2019-08-06 VITALS — BP 110/82 | HR 73 | Ht 61.0 in | Wt 188.4 lb

## 2019-08-06 DIAGNOSIS — R0600 Dyspnea, unspecified: Secondary | ICD-10-CM

## 2019-08-06 DIAGNOSIS — R0602 Shortness of breath: Secondary | ICD-10-CM | POA: Diagnosis not present

## 2019-08-06 DIAGNOSIS — E7800 Pure hypercholesterolemia, unspecified: Secondary | ICD-10-CM

## 2019-08-06 DIAGNOSIS — R0609 Other forms of dyspnea: Secondary | ICD-10-CM

## 2019-08-06 DIAGNOSIS — R9431 Abnormal electrocardiogram [ECG] [EKG]: Secondary | ICD-10-CM

## 2019-08-06 MED ORDER — METOPROLOL TARTRATE 100 MG PO TABS
100.0000 mg | ORAL_TABLET | Freq: Once | ORAL | 0 refills | Status: DC
Start: 2019-08-06 — End: 2019-08-17

## 2019-08-06 NOTE — Patient Instructions (Addendum)
Medication Instructions:   Your physician has recommended you make the following change in your medication:   Pre CTA: Lopressor: Take 1 tablet (100 mg total) by mouth once for 1 dose. Take 2 hours prior to your CT scan.  *If you need a refill on your cardiac medications before your next appointment, please call your pharmacy*   Lab Work:  Please get a lab draw within 1 week of your CTA. - Please go to the [facility]. You will check in at the front desk to the right as you walk into the atrium. Valet Parking is offered if needed. - No appointment needed. You may go any day between 7 am and 6 pm.  If you have labs (blood work) drawn today and your tests are completely normal, you will receive your results only by: Marland Kitchen MyChart Message (if you have MyChart) OR . A paper copy in the mail If you have any lab test that is abnormal or we need to change your treatment, we will call you to review the results.   Testing/Procedures  Your physician has requested that you have cardiac CT. Cardiac computed tomography (CT) is a painless test that uses an x-ray machine to take clear, detailed pictures of your heart.  Your cardiac CT will be scheduled at one of the below locations:   [facility] [address] [place], [place] [address] [phone]  [facility] [address] [place], [place] [address] [phone]  If scheduled at [facility], please arrive at the [facility] main entrance of [facility] 30 minutes prior to test start time. Proceed to the [facility] Radiology Department (first floor) to check-in and test prep.  If scheduled at [facility], please arrive 15 mins early for check-in and test prep.  Please follow these instructions carefully (unless otherwise directed):    On the Night Before the Test: . Be sure to Drink plenty of water. . Do not consume any  caffeinated/decaffeinated beverages or chocolate 12 hours prior to your test. . Do not take any antihistamines 12 hours prior to your test. On the Day of the Test: . Drink plenty of water. Do not drink any water within one hour of the test. . Do not eat any food 4 hours prior to the test. . You may take your regular medications prior to the test.  . Take metoprolol (Lopressor) two hours prior to test. . FEMALES- please wear underwire-free bra if available        After the Test: . Drink plenty of water. . After receiving IV contrast, you may experience a mild flushed feeling. This is normal. . On occasion, you may experience a mild rash up to 24 hours after the test. This is not dangerous. If this occurs, you can take Benadryl 25 mg and increase your fluid intake. . If you experience trouble breathing, this can be serious. If it is severe call 911 IMMEDIATELY. If it is mild, please call our office. . If you take any of these medications: Glipizide/Metformin, Avandament, Glucavance, please do not take 48 hours after completing test unless otherwise instructed.   Once we have confirmed authorization from your insurance company, we will call you to set up a date and time for your test.   For non-scheduling related questions, please contact the cardiac imaging nurse navigator should you have any questions/concerns: Marchia Bond, Cardiac Imaging Nurse Navigator [place], Interim  Cardiac Imaging Nurse [place] [facility] Direct Office Dial: [phone]   For scheduling needs, including cancellations and rescheduling, please call [phone].    Follow-Up: At [facility], you and your health needs are our priority.  As part of our continuing mission to provide you with exceptional heart care, we have created designated Provider Care Teams.  These Care Teams include your primary Cardiologist (physician) and Advanced Practice Providers (APPs -  Physician  Assistants and Nurse Practitioners) who all work together to provide you with the care you need, when you need it.  We recommend signing up for the patient portal called "MyChart".  Sign up information is provided on this After Visit Summary.  MyChart is used to connect with patients for Virtual Visits (Telemedicine).  Patients are able to view lab/test results, encounter notes, upcoming appointments, etc.  Non-urgent messages can be sent to your provider as well.   To learn more about what you can do with MyChart, go to [email or web address].    Your next appointment:   Follow up after CTA results   The format for your next appointment:   In Person  Provider:   Kate Sable, MD

## 2019-08-06 NOTE — Progress Notes (Signed)
Cardiology Office Note:    Date:  08/06/2019   ID:  Claire [name], DOB March 23, 1970, MRN [id number]  PCP:  Steele Sizer, MD  [facility] Cardiologist:  Kate Sable, MD  [place] Electrophysiologist:  None   Referring MD: Tyler Pita, MD   Chief Complaint  Patient presents with  . New Patient (Initial Visit)    Ref by Dr. Patsey Berthold for cardiac arrhythmia and heart murmur. Meds reviewed by the pt. verbally. Pt. c/o shortness of breath, chest pressure, fluttering in chest at times and LE edema.    Claire [name] is a 49 y.o. female who is being seen today for the evaluation of cardiac murmur at the request of Tyler Pita, MD.   History of Present Illness:    Claire [name] is a 49 y.o. female with a hx of anxiety, diabetes, depression, hyperlipidemia who presents due to shortness of breath and arrhythmias.  Patient with recent COVID-19 pneumonia 1 to 2 months ago.  Patient was discharged on home oxygen and eventually decreased home oxygen levels to as needed basis.  Patient has been having dyspnea on exertion since.  He denies any shortness of breath prior to having Covid.  Recently saw pulmonary medicine where EKG showed sinus rhythm with frequent PACs and compensatory pauses.  Systolic murmur was also noted on exam.  She has been told in the past that she has an abnormal cardiac rhythm.  She denies palpitations or chest pain, endorses having occasional skipped heartbeats.  States having a strong family history of CAD with her sister having a massive heart attack in her 42s, her mother having MI in her 51s.  Her father had MI in his 33s.  Echocardiogram was ordered, performed on 08/05/2019 which showed normal systolic and diastolic function, EF 60 to 65%.  No gross valvular pathology noted.  Trivial MR  Past Medical History:  Diagnosis Date  . Anemia   . Anxiety   . Arthritis    hands, hips  . Asthma   . Bilateral leg cramps   . Depression   .  Diabetes mellitus without complication ([place])    history no longer a problem since weight loss surgery  . DJD (degenerative joint disease)   . GERD (gastroesophageal reflux disease)   . Headache    Migraines  . IBS (irritable bowel syndrome)   . Kidney stone   . Low serum vitamin B12   . Lung nodules   . Migraine    down to approx 4x/mo since starting meds  . Muscle spasm   . Psoriasis    vaginal area  . Seasonal allergies   . Sleep apnea    history no longer a problem since weight loss surgery  . Vasovagal syncope    [place], Dr. Alveria Apley   . Vitamin D deficiency     Past Surgical History:  Procedure Laterality Date  . BLADDER SURGERY  2010  . BREAST BIOPSY Right 2014   stereotatic biopsy  . CHOLECYSTECTOMY  2010  . COLONOSCOPY  2014    Done at [facility]  . COLONOSCOPY WITH PROPOFOL N/A 01/28/2018   Procedure: COLONOSCOPY WITH PROPOFOL;  Surgeon: Virgel Manifold, MD;  Location: [place];  Service: Endoscopy;  Laterality: N/A;  . DILITATION & CURRETTAGE/HYSTROSCOPY WITH NOVASURE ABLATION N/A 06/16/2015   Procedure: DILATATION & CURETTAGE/HYSTEROSCOPY WITH NOVASURE ABLATION;  Surgeon: Brien Few, MD;  Location: [place] [facility];  Service: Gynecology;  Laterality: N/A;  . ESOPHAGOGASTRODUODENOSCOPY (EGD) WITH PROPOFOL N/A  01/28/2018   Procedure: ESOPHAGOGASTRODUODENOSCOPY (EGD) WITH BIOPSIES;  Surgeon: Virgel Manifold, MD;  Location: [place];  Service: Endoscopy;  Laterality: N/A;  . [place] RESECTION  2012  . LAPAROSCOPY N/A 06/16/2015   Procedure: LAPAROSCOPY DIAGNOSTIC;  Surgeon: Brien Few, MD;  Location: [place] [facility];  Service: Gynecology;  Laterality: N/A;  . LAPAROSCOPY N/A 04/01/2018   Procedure: LAPAROSCOPY DIAGNOSTIC ERAS PATHWAY ENTEROLYSIS, CECOPEXY;  Surgeon: Johnathan Hausen, MD;  Location: [facility];  Service: General;  Laterality: N/A;  . LITHOTRIPSY  12/11/2017   laser lithotripsy  . LYSIS OF ADHESION N/A 06/16/2015    Procedure: LYSIS OF ADHESION;  Surgeon: Brien Few, MD;  Location: [place] [facility];  Service: Gynecology;  Laterality: N/A;  . PLANTAR FASCIA SURGERY Bilateral   . ROBOTIC ASSISTED SALPINGO OOPHERECTOMY Right 06/16/2015   Procedure: ROBOTIC ASSISTED SALPINGO OOPHORECTOMY, EXCISION OF RIGHT CUL DE [place] MASS;  Surgeon: Brien Few, MD;  Location: [place] [facility];  Service: Gynecology;  Laterality: Right;  . TONSILLECTOMY    . UPPER GI ENDOSCOPY      Current Medications: Current Meds  Medication Sig  . Accu-Chek FastClix Lancets MISC USE TO TEST UP TO 4 TIMES A DAY  . ACCU-CHEK GUIDE test strip   . ADDERALL XR 30 MG 24 hr capsule Take 30 mg by mouth daily.   Marland Kitchen albuterol (VENTOLIN HFA) 108 (90 Base) MCG/ACT inhaler TAKE 2 PUFFS EVERY 6 HOURS AS NEEDED FOR WHEEZING OR SHORTNESS OF BREATH. (VENTOLIN NOT COVERED)  . Ascorbic Acid (VITAMIN C) 1000 MG tablet Take 1,000 mg by mouth.  Marland Kitchen atorvastatin (LIPITOR) 10 MG tablet Take 1 tablet (10 mg total) by mouth daily.  . calcium carbonate (OS-CAL) 600 MG TABS tablet Take 600 mg by mouth daily with breakfast.  . clobetasol ointment (TEMOVATE) 6.96 % Apply 1 application topically daily as needed (psoriasis).   . cyanocobalamin (,VITAMIN B-12,) 1000 MCG/ML injection INJECT 1 ML (1,000 MCG TOTAL) INTO THE MUSCLE EVERY 14 (FOURTEEN) DAYS.  Marland Kitchen cyclobenzaprine (FLEXERIL) 10 MG tablet Take 1 tablet (10 mg total) by mouth at bedtime. Prn (Patient taking differently: Take 10 mg by mouth at bedtime as needed (migraine). )  . dicyclomine (BENTYL) 10 MG capsule TAKE 1 CAPSULE (10 MG TOTAL) BY MOUTH 4 (FOUR) TIMES DAILY - BEFORE MEALS AND AT BEDTIME.  . fluconazole (DIFLUCAN) 150 MG tablet Take 1 tablet (150 mg total) by mouth every other day.  . fluocinonide (LIDEX) 0.05 % external solution APPLY TO AFFECTED SCALP TWICE DAILY UNTIL CLEAR, THEN AS NEEDED  . ketoconazole (NIZORAL) 2 % shampoo PLEASE SEE ATTACHED FOR DETAILED DIRECTIONS  . Lactobacillus (ACIDOPHILUS PO) Take 1 tablet  by mouth daily.  Marland Kitchen loratadine (CLARITIN) 10 MG tablet Take 10 mg by mouth daily.  . 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   . mometasone (NASONEX) 50 MCG/ACT nasal spray USE 2 SPRAYS IN EACH NOSTRIL AT BEDTIME AS NEEDED FOR ALLERGIES  . montelukast (SINGULAIR) 10 MG tablet TAKE 1 TABLET (10 MG TOTAL) BY MOUTH AT BEDTIME. TAKE 1 TABLET(10 MG) BY MOUTH AT BEDTIME  . pregabalin (LYRICA) 100 MG capsule Take 1 capsule (100 mg total) by mouth 3 (three) times daily.  . promethazine (PHENERGAN) 12.5 MG tablet TAKE 1 TABLET BY MOUTH EVERY 6 HOURS AS NEEDED FOR MIGRAINES WITH NAUSEA  . Semaglutide,0.25 or 0.5MG/DOS, (OZEMPIC, 0.25 OR 0.5 MG/DOSE,) 2 MG/1.5ML SOPN Inject 0.5 mg into the skin once a week.  Marland Kitchen  SYRINGE-NEEDLE, DISP, 3 ML (B-D 3CC LUER-LOK SYR 25GX1") 25G X 1" 3 ML MISC 1 each by Does not apply route every 14 (fourteen) days.  . traZODone (DESYREL) 100 MG tablet Take 100-200 mg by mouth at bedtime as needed.  . TRINTELLIX 20 MG TABS tablet Take 20 mg by mouth every morning.   . Vitamin D, Ergocalciferol, (DRISDOL) 1.25 MG (50000 UNIT) CAPS capsule TAKE 1 CAPSULE (50,000 UNITS TOTAL) BY MOUTH EVERY 7 (SEVEN) DAYS. TAKE 1 CAPSULE EVERY 7 DAYS  . zonisamide (ZONEGRAN) 50 MG capsule Take 150 capsules by mouth daily.      Allergies:   Aspartame and phenylalanine, Gabapentin, Linzess [linaclotide], Other, Adhesive [tape], Cymbalta [duloxetine hcl], and Tapentadol   Social History   Socioeconomic History  . Marital status: Married    Spouse name: Merrilee Seashore   . Number of children: 3  . Years of education: Not on file  . Highest education level: Some college, no degree  Occupational History  . Not on file  Tobacco Use  . Smoking status: Former Smoker    Packs/day: 1.00    Years: 10.00    Pack years: 10.00    Types: Cigarettes    Quit date: 02/19/1993    Years since quitting: 26.4  . Smokeless tobacco: Never Used  Vaping Use  .  Vaping Use: Never used  Substance and Sexual Activity  . Alcohol use: Yes    Alcohol/week: 0.0 standard drinks    Comment: occasionally  . Drug use: No  . Sexual activity: Yes    Partners: Male    Comment: last sex 04 Jan 2018  Other Topics Concern  . Not on file  Social History Narrative  . Not on file   Social Determinants of Health   Financial Resource Strain:   . Difficulty of Paying Living Expenses:   Food Insecurity:   . Worried About Charity fundraiser in the Last Year:   . Arboriculturist in the Last Year:   Transportation Needs:   . Film/video editor (Medical):   Marland Kitchen Lack of Transportation (Non-Medical):   Physical Activity:   . Days of Exercise per Week:   . Minutes of Exercise per Session:   Stress:   . Feeling of Stress :   Social Connections:   . Frequency of Communication with Friends and Family:   . Frequency of Social Gatherings with Friends and Family:   . Attends Religious Services:   . Active Member of Clubs or Organizations:   . Attends Archivist Meetings:   Marland Kitchen Marital Status:      Family History: The patient's family history includes Breast cancer in her paternal aunt and paternal grandmother; Heart attack in her maternal uncle, mother, paternal grandfather, and sister; Hyperlipidemia in her father; Lung cancer in her maternal grandfather; Multiple myeloma in her mother; Stroke in her mother.  ROS:   Please see the history of present illness.     All other systems reviewed and are negative.  EKGs/Labs/Other Studies Reviewed:    The following studies were reviewed today:   EKG:  EKG is  ordered today.  The ekg ordered today demonstrates normal sinus rhythm, normal ECG.  Recent Labs: 04/02/2019: ALT 21; BUN 12; Creatinine, Ser 0.71; Potassium 3.9; Sodium 137 04/20/2019: Hemoglobin 12.7; Platelets 379; TSH 0.018  Recent Lipid Panel    Component Value Date/Time   CHOL 179 12/31/2018 0857   TRIG 99 12/31/2018 0857   HDL 64  12/31/2018  0857   CHOLHDL 2.8 12/31/2018 0857   CHOLHDL 2.6 11/19/2016 0921   VLDL 20 11/11/2015 0846   LDLCALC 97 12/31/2018 0857   LDLCALC 72 11/19/2016 0921    Physical Exam:    VS:  BP 110/82 (BP Location: Right Arm, Patient Position: Sitting, Cuff Size: Normal)   Pulse 73   Ht 5' 1"  (1.549 m)   Wt 188 lb 6 oz (85.4 kg)   SpO2 98%   BMI 35.59 kg/m     Wt Readings from Last 3 Encounters:  08/06/19 188 lb 6 oz (85.4 kg)  08/04/19 192 lb 9.6 oz (87.4 kg)  08/03/19 190 lb 1.6 oz (86.2 kg)     GEN:  Well nourished, well developed in no acute distress HEENT: Normal NECK: No JVD; No carotid bruits LYMPHATICS: No lymphadenopathy CARDIAC: RRR, no murmurs, rubs, gallops RESPIRATORY:  Clear to auscultation without rales, wheezing or rhonchi  ABDOMEN: Soft, non-tender, non-distended MUSCULOSKELETAL:  No edema; No deformity  SKIN: Warm and dry NEUROLOGIC:  Alert and oriented x 3 PSYCHIATRIC:  Normal affect   ASSESSMENT:    1. Nonspecific abnormal electrocardiogram (ECG) (EKG)   2. Dyspnea on exertion   3. Pure hypercholesterolemia   4. Shortness of breath    PLAN:    In order of problems listed above:  1. Patient with reported history of PACs on EKG.  EKG on 05/29/2019 showed sinus rhythm with occasional PVCs, this is likely the reason for occasional skipped heartbeats inpatient.  EKG in the office today shows normal sinus rhythm, normal tracing.  Continue to monitor.  No indication for cardiac monitor at this time. 2. Patient with dyspnea on exertion, echocardiogram with normal systolic and diastolic function.  She has risk factors of CAD including hyperlipidemia, strong family history with with sister passing from a massive heart attack, MI in the mother, MI and death.  Obtain echo coronary CTA to evaluate for CAD.  Her symptoms may be secondary to post viral sequelae secondary to COVID-19.  But her risk factors and strong family history simply cannot be ignored. 3. History of  hyperlipidemia, continue statin as prescribed.  Follow-up after coronary CTA.   Medication Adjustments/Labs and Tests Ordered: Current medicines are reviewed at length with the patient today.  Concerns regarding medicines are outlined above.  Orders Placed This Encounter  Procedures  . CT CORONARY MORPH W/CTA COR W/SCORE W/CA W/CM &/OR WO/CM  . CT CORONARY FRACTIONAL FLOW RESERVE DATA PREP  . CT CORONARY FRACTIONAL FLOW RESERVE FLUID ANALYSIS  . Basic Metabolic Panel (BMET)  . EKG 12-Lead   Meds ordered this encounter  Medications  . metoprolol tartrate (LOPRESSOR) 100 MG tablet    Sig: Take 1 tablet (100 mg total) by mouth once for 1 dose. Take 2 hours prior to your CT scan.    Dispense:  1 tablet    Refill:  0    Patient Instructions  Medication Instructions:   Your physician has recommended you make the following change in your medication:   Pre CTA: Lopressor: Take 1 tablet (100 mg total) by mouth once for 1 dose. Take 2 hours prior to your CT scan.  *If you need a refill on your cardiac medications before your next appointment, please call your pharmacy*   Lab Work:  Please get a lab draw within 1 week of your CTA. - Please go to the [facility]. You will check in at the front desk to the right as you walk into the  atrium. Valet Parking is offered if needed. - No appointment needed. You may go any day between 7 am and 6 pm.  If you have labs (blood work) drawn today and your tests are completely normal, you will receive your results only by: Marland Kitchen MyChart Message (if you have MyChart) OR . A paper copy in the mail If you have any lab test that is abnormal or we need to change your treatment, we will call you to review the results.   Testing/Procedures  Your physician has requested that you have cardiac CT. Cardiac computed tomography (CT) is a painless test that uses an x-ray machine to take clear, detailed pictures of your heart.  Your cardiac CT will be  scheduled at one of the below locations:   [facility] [address] [place], [place] [address] [phone]  [place] [address] [place], [place] [address] [phone]  If scheduled at [facility], please arrive at the [facility] main entrance of [facility] 30 minutes prior to test start time. Proceed to the [facility] Radiology Department (first floor) to check-in and test prep.  If scheduled at [facility], please arrive 15 mins early for check-in and test prep.  Please follow these instructions carefully (unless otherwise directed):    On the Night Before the Test: . Be sure to Drink plenty of water. . Do not consume any caffeinated/decaffeinated beverages or chocolate 12 hours prior to your test. . Do not take any antihistamines 12 hours prior to your test. On the Day of the Test: . Drink plenty of water. Do not drink any water within one hour of the test. . Do not eat any food 4 hours prior to the test. . You may take your regular medications prior to the test.  . Take metoprolol (Lopressor) two hours prior to test. . FEMALES- please wear underwire-free bra if available        After the Test: . Drink plenty of water. . After receiving IV contrast, you may experience a mild flushed feeling. This is normal. . On occasion, you may experience a mild rash up to 24 hours after the test. This is not dangerous. If this occurs, you can take Benadryl 25 mg and increase your fluid intake. . If you experience trouble breathing, this can be serious. If it is severe call 911 IMMEDIATELY. If it is mild, please call our office. . If you take any of these medications: Glipizide/Metformin, Avandament, Glucavance, please do not take 48 hours after completing test unless otherwise instructed.   Once we have confirmed authorization from your insurance company, we will call  you to set up a date and time for your test.   For non-scheduling related questions, please contact the cardiac imaging nurse navigator should you have any questions/concerns: Marchia Bond, Cardiac Imaging Nurse Navigator Burley Saver, Interim Cardiac Imaging Nurse [place] [facility] Direct Office Dial: [phone]   For scheduling needs, including cancellations and rescheduling, please call [phone].    Follow-Up: At [facility], you and your health needs are our priority.  As part of our continuing mission to provide you with exceptional heart care, we have created designated Provider Care Teams.  These Care Teams include your primary Cardiologist (physician) and Advanced Practice Providers (APPs -  Physician Assistants and Nurse Practitioners) who all work together to provide you with the care you need, when you need  it.  We recommend signing up for the patient portal called "MyChart".  Sign up information is provided on this After Visit Summary.  MyChart is used to connect with patients for Virtual Visits (Telemedicine).  Patients are able to view lab/test results, encounter notes, upcoming appointments, etc.  Non-urgent messages can be sent to your provider as well.   To learn more about what you can do with MyChart, go to [email or web address].    Your next appointment:   Follow up after CTA results   The format for your next appointment:   In Person  Provider:   Kate Sable, MD        Signed, Kate Sable, MD  08/06/2019 12:37 PM    [place]

## 2019-08-07 ENCOUNTER — Encounter: Payer: No Typology Code available for payment source | Attending: Surgery | Admitting: Dietician

## 2019-08-07 ENCOUNTER — Encounter: Payer: Self-pay | Admitting: Dietician

## 2019-08-07 VITALS — Ht 61.0 in | Wt 190.4 lb

## 2019-08-07 DIAGNOSIS — Z713 Dietary counseling and surveillance: Secondary | ICD-10-CM | POA: Diagnosis present

## 2019-08-07 DIAGNOSIS — K449 Diaphragmatic hernia without obstruction or gangrene: Secondary | ICD-10-CM | POA: Diagnosis not present

## 2019-08-07 DIAGNOSIS — R109 Unspecified abdominal pain: Secondary | ICD-10-CM | POA: Diagnosis not present

## 2019-08-07 DIAGNOSIS — K2100 Gastro-esophageal reflux disease with esophagitis, without bleeding: Secondary | ICD-10-CM | POA: Insufficient documentation

## 2019-08-07 DIAGNOSIS — Z9884 Bariatric surgery status: Secondary | ICD-10-CM | POA: Diagnosis not present

## 2019-08-07 NOTE — Progress Notes (Signed)
Appt start time: 1100 end time:  1115.  Assessment:   #6 SWL Appointment.   Start Wt at [facility]: 189.8lbslbs Wt: 190.4lbs Ht: 5'1" BMI: 36.0   Learning Readiness:   Change in progress  MEDICATIONS: Adderall, Albuterol inhaler, atorvastatin, cyclobenzaprine, dicyclomine, bluconazole, lactobacillus, loratadine, montelukast, omeprazole, ondansetron, pregalabin, promthazine, Semaglutide, traZODone, trintellix, zonisamide  Progress:  Patient reports improvement in edema in feet and ankles, but not completely resolved. Cardiac testing was done last week and results were normal per patient. She is no longer using supplemental oxygen per pulmonology recommendation.   She continues to follow bariatric eating pattern.   Dietary intake:  Breakfast: 2 eggs or 1/2 breakfast sandwich  Snack: none or protein bar  Lunch: ceviche or 1/2 sandwich Snack: same as am Dinner: chicken/ fish/ venison + 1-2 low-carb vegetables Snack: same as am Beverages: water, sugar free beverages; uses Crystal Light Pure (with Stevia) due to intolerance of aspartame and sucralose  Usual physical activity: continues to increase daily walking/ activity  Diet to Follow: Continue with current eating pattern               Nutritional Diagnosis:  [place]-3.3 Overweight/obesity related to history of excess calories and physical inactivity as evidenced by patient with current BMI of 36, following dietary guidelines for weight loss prior to bariatric surgery revision.              Intervention:   . Nutrition counseling for weight loss prior to upcoming bariatric surgery revision.   Teaching Method Utilized:  Visual Auditory   Barriers to learning/adherence to lifestyle change: none  Demonstrated degree of understanding via:  Teach Back   Monitoring/Evaluation:  Dietary intake, exercise, and body weight 2 weeks post-op.

## 2019-08-07 NOTE — Progress Notes (Signed)
Pre-Operative Nutrition Class:  Appt start time: 0900   End time:  1100.  Patient was seen on 08/07/19 for Pre-Operative Bariatric Surgery Education at Nutrition [facility] at [facility].   Surgery date: TBD Surgery type: revision from sleeve to RNY bypass Start weight at [facility]: 189.8lbs Weight today: 190.4lbs  InBody  BODY COMP RESULTS    BMI (kg/m^2) 36.0  Fat Mass (lbs) 97.4  Dry Lean Mass (lbs) 24.9  Total Body Water (lbs) 68.1   Samples given per MNT protocol. Patient educated on appropriate usage: Celebrate Vitamins pack: Lot number Expiration date   Calcium soft chews cherry   1070  10/2020  Calcium soft chews watermelon 0309 06/2020  Calcium soft chews straw-ban cream 1011 08/2020  Calcium soft chews caf mocha 8208 06/2020  Calcium soft chews blackberry 0426 04/2020  Calcium soft chews orange 0275 04/2020  Calcium soft chews caramel 0349 07/2020  Calcium soft chews chocolate 1010 08/2020  Calcium soft chew Isl. fruit 0338 07/2020  Calcium soft chew lemon cream 1032 09/2020    Calcium (tablet) 500 cherry tart [phone] 02/2020  B12 Quick-melt Cherry   [phone] 02/2021       Iron soft chews 75m cherry burst 255015A606/2022  Iron soft chews 672mtwisted citrus 2082574V306/2022  Iron 45 tab grape 00552-17478/2022      Multivitamin soft chews very cherry 21011B6 08/2020  Multivitamin soft chews strawberry 2115953X67/2022  Multi complete 45 watermelon 00728-97914/2022  Multivitamin grape [phone] 06/2020    Premier Beverages:     Vanilla shake 035041360/10/2019         Unjury products:   104383J7P3P04/27/2022  Chocolate shake 106886Y8E7U4/28/2022    Unflavored powder 120721821/2022  Chocolate powder 528833743/2022  Chicken soup powder 124514601/2022    The following the learning objectives were met by the patient during this course:  Identify Pre-Op Dietary Goals and will begin 2 weeks pre-operatively  Identify appropriate  sources of fluids and proteins   State protein recommendations and appropriate sources pre and post-operatively  Identify Post-Operative Dietary Goals and will follow for 2 weeks post-operatively  Identify appropriate multivitamin and calcium sources  Describe the need for physical activity post-operatively and will follow MD recommendations  State when to call healthcare provider regarding medication questions or post-operative complications  Handouts given during class include:  Pre-Op Bariatric Surgery Diet Handout  Protein Shake Handout  Post-Op Bariatric Surgery Nutrition Handout  BELT Program Information Flyer  Support Group Information Flyer  [facility] B[facility]ric Supplements Price List  Follow-Up Plan: [facility] 2 weeks post operatively for diet advancement per MD.

## 2019-08-17 ENCOUNTER — Other Ambulatory Visit: Payer: Self-pay

## 2019-08-17 MED ORDER — METOPROLOL TARTRATE 100 MG PO TABS: 100.0000 mg | ORAL_TABLET | Freq: Once | ORAL | 0 refills | Status: DC

## 2019-08-18 ENCOUNTER — Encounter: Payer: Self-pay | Admitting: Family Medicine

## 2019-08-19 ENCOUNTER — Other Ambulatory Visit
Admission: RE | Admit: 2019-08-19 | Discharge: 2019-08-19 | Disposition: A | Discharge status: Home / Self Care | Payer: No Typology Code available for payment source | Attending: Cardiology | Admitting: Cardiology

## 2019-08-19 DIAGNOSIS — R0602 Shortness of breath: Secondary | ICD-10-CM | POA: Diagnosis not present

## 2019-08-19 DIAGNOSIS — R0600 Dyspnea, unspecified: Secondary | ICD-10-CM | POA: Insufficient documentation

## 2019-08-19 DIAGNOSIS — E7800 Pure hypercholesterolemia, unspecified: Secondary | ICD-10-CM | POA: Diagnosis not present

## 2019-08-19 DIAGNOSIS — R9431 Abnormal electrocardiogram [ECG] [EKG]: Secondary | ICD-10-CM | POA: Insufficient documentation

## 2019-08-19 LAB — BASIC METABOLIC PANEL
Anion gap: 6 (ref 5–15)
BUN: 9 mg/dL (ref 6–20)
CO2: 27 mmol/L (ref 22–32)
Calcium: 8.5 mg/dL — ABNORMAL LOW (ref 8.9–10.3)
Chloride: 105 mmol/L (ref 98–111)
Creatinine, Ser: 0.77 mg/dL (ref 0.44–1.00)
GFR calc Af Amer: >60 mL/min (ref >60)
GFR calc non Af Amer: >60 mL/min (ref >60)
Glucose, Bld: 83 mg/dL (ref 70–99)
Potassium: 4 mmol/L (ref 3.5–5.1)
Sodium: 138 mmol/L (ref 135–145)

## 2019-08-20 ENCOUNTER — Other Ambulatory Visit: Payer: Self-pay

## 2019-08-20 ENCOUNTER — Ambulatory Visit
Admission: RE | Admit: 2019-08-20 | Discharge: 2019-08-20 | Disposition: A | Discharge status: Home / Self Care | Payer: No Typology Code available for payment source | Source: Ambulatory Visit | Attending: Cardiology | Admitting: Cardiology

## 2019-08-20 DIAGNOSIS — R0602 Shortness of breath: Secondary | ICD-10-CM | POA: Diagnosis present

## 2019-08-20 DIAGNOSIS — R9431 Abnormal electrocardiogram [ECG] [EKG]: Secondary | ICD-10-CM | POA: Diagnosis not present

## 2019-08-20 LAB — POCT I-STAT CREATININE: Creatinine, Ser: 0.6 mg/dL (ref 0.44–1.00)

## 2019-08-20 MED ORDER — NITROGLYCERIN 0.4 MG SL SUBL
0.8000 mg | SUBLINGUAL_TABLET | Freq: Once | SUBLINGUAL | Status: AC
  Administered 2019-08-20: 0.8 mg via SUBLINGUAL

## 2019-08-20 MED ORDER — IOHEXOL 350 MG/ML SOLN
100.0000 mL | Freq: Once | INTRAVENOUS | Status: AC | PRN
  Administered 2019-08-20: 100 mL via INTRAVENOUS

## 2019-08-20 NOTE — Progress Notes (Signed)
Patient tolerated procedure well. Ambulate w/o difficulty. Sitting in chair drinking. No needs. All questions answered. ABC intact. Discharge from procedure area w/o issues. 

## 2019-08-25 ENCOUNTER — Ambulatory Visit: Payer: No Typology Code available for payment source

## 2019-09-08 ENCOUNTER — Ambulatory Visit: Payer: No Typology Code available for payment source | Admitting: Pulmonary Disease

## 2019-09-08 ENCOUNTER — Other Ambulatory Visit: Payer: Self-pay

## 2019-09-08 ENCOUNTER — Encounter: Payer: Self-pay | Admitting: Pulmonary Disease

## 2019-09-08 VITALS — BP 126/76 | HR 77 | Temp 97.7°F | Ht 61.0 in | Wt 192.2 lb

## 2019-09-08 DIAGNOSIS — R0602 Shortness of breath: Secondary | ICD-10-CM

## 2019-09-08 NOTE — Patient Instructions (Signed)
Follow-up as needed.  Should you notice that you have to start using your albuterol frequently let us now so we can get you reevaluated at that time.

## 2019-09-08 NOTE — Progress Notes (Signed)
 Assessment & Plan:  1. SOB (shortness of breath) (Primary)   Patient Instructions  Follow-up as needed.  Should you notice that you have to start using your albuterol  frequently let us  now so we can get you reevaluated at that time.  Please note: late entry documentation due to logistical difficulties during COVID-19 pandemic. This note is filed for information purposes only, and is not intended to be used for billing, nor does it represent the full scope/nature of the visit in question. Please see any associated scanned media linked to date of encounter for additional pertinent information.  Subjective:    HPI: Claire [name] is a 49 y.o. female presenting to the pulmonology clinic on 09/08/2019 with report of: Follow-up (review Echo--no current sx. )   Patient is a 49 year old remote former smoker who presents for follow-up after initial evaluation here on 04 August 2019.  At that time she was being evaluated for dyspnea following COVID-19 hospitalization in late April/early May at [facility].  She was discharged home on oxygen however she was able to be weaned off of this.  Since her initial visit here she now reports that she has no issues with dyspnea on exertion.  She has not had any orthopnea, paroxysmal nocturnal dyspnea or cough.  She has had mild lower extremity edema but this has been a chronic issue.  Today she is totally asymptomatic from the respiratory standpoint.  She has been undergoing cardiac evaluation due to issues with vasovagal syncope and dysautonomia.  The patient has been told in the past that she has asthma however she has not required any inhalers for control of this issue.  2D echo performed 05 August 2019 showed normal LVEF of 60 to 65%, trivial mitral valve regurgitation.  Normal right-sided pressures.  No wall motion abnormalities.  Patient does not endorse any other symptoms.  She feels well and looks well.  Outpatient Encounter Medications as of 09/08/2019   Medication Sig Note   ketoconazole  (NIZORAL ) 2 % shampoo Apply 1 application topically 2 (two) times daily as needed (psoriasis).     Lactobacillus (ACIDOPHILUS PO) Take 1 capsule by mouth daily. 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     zonisamide (ZONEGRAN) 50 MG capsule Take 150 mg by mouth daily.     [DISCONTINUED] Accu-Chek FastClix Lancets MISC  (Patient not taking: Reported on 05/06/2023)    [DISCONTINUED] ACCU-CHEK GUIDE test strip  (Patient not taking: Reported on 05/06/2023)    [DISCONTINUED] ADDERALL XR 30 MG 24 hr capsule Take 30 mg by mouth daily.     [DISCONTINUED] albuterol  (VENTOLIN  HFA) 108 (90 Base) MCG/ACT inhaler TAKE 2 PUFFS EVERY 6 HOURS AS NEEDED FOR WHEEZING OR SHORTNESS OF BREATH. (VENTOLIN  NOT COVERED) (Patient taking differently: Inhale 2 puffs into the lungs every 6 (six) hours as needed for wheezing or shortness of breath. )    [DISCONTINUED] Ascorbic Acid (VITAMIN C) 1000 MG tablet Take 1,000 mg by mouth. (Patient not taking: Reported on 11/16/2019) 10/14/2019: On hold    [DISCONTINUED] aspirin  EC 81 MG tablet Take 81 mg by mouth daily.  (Patient not taking: Reported on 09/11/2019) 06/02/2019: On hold-Possible Surgery   [DISCONTINUED] atorvastatin  (LIPITOR) 10 MG tablet Take 1 tablet (10 mg total) by mouth daily.    [DISCONTINUED] calcium  carbonate (OS-CAL) 600 MG TABS tablet Take 600 mg by mouth daily with breakfast. 10/14/2019: On hold   [DISCONTINUED] clobetasol  ointment (TEMOVATE) 0.05 % Apply 1 application topically daily as needed (psoriasis).  (Patient not taking: Reported on 05/06/2023)    [DISCONTINUED] cyanocobalamin  (,VITAMIN B-12,) 1000 MCG/ML injection INJECT 1 ML (1,000 MCG TOTAL) INTO THE MUSCLE EVERY 14 (FOURTEEN) DAYS.    [DISCONTINUED] cyclobenzaprine  (FLEXERIL ) 10 MG tablet Take 1 tablet (10 mg total) by mouth at bedtime. Prn (Patient taking differently: No sig  reported)    [DISCONTINUED] dicyclomine  (BENTYL ) 10 MG capsule TAKE 1 CAPSULE (10 MG TOTAL) BY MOUTH 4 (FOUR) TIMES DAILY - BEFORE MEALS AND AT BEDTIME. (Patient not taking: Reported on 11/16/2019)    [DISCONTINUED] fluconazole  (DIFLUCAN ) 150 MG tablet Take 1 tablet (150 mg total) by mouth every other day. (Patient taking differently: Take 150 mg by mouth daily as needed (yeast infection.). )    [DISCONTINUED] fluocinonide (LIDEX) 0.05 % external solution Apply 1 application topically 2 (two) times daily as needed (psoriasis).  (Patient not taking: Reported on 05/06/2023)    [DISCONTINUED] loratadine  (CLARITIN ) 10 MG tablet Take 10 mg by mouth daily.    [DISCONTINUED] mometasone  (NASONEX ) 50 MCG/ACT nasal spray USE 2 SPRAYS IN EACH NOSTRIL AT BEDTIME AS NEEDED FOR ALLERGIES (Patient taking differently: Place 2 sprays into the nose daily.)    [DISCONTINUED] montelukast  (SINGULAIR ) 10 MG tablet TAKE 1 TABLET (10 MG TOTAL) BY MOUTH AT BEDTIME. TAKE 1 TABLET(10 MG) BY MOUTH AT BEDTIME    [DISCONTINUED] pregabalin  (LYRICA ) 100 MG capsule Take 1 capsule (100 mg total) by mouth 3 (three) times daily. (Patient taking differently: Take 100 mg by mouth 2 (two) times daily. )    [DISCONTINUED] promethazine  (PHENERGAN ) 12.5 MG tablet TAKE 1 TABLET BY MOUTH EVERY 6 HOURS AS NEEDED FOR MIGRAINES WITH NAUSEA    [DISCONTINUED] Semaglutide ,0.25 or 0.5MG /DOS, (OZEMPIC , 0.25 OR 0.5 MG/DOSE,) 2 MG/1.5ML SOPN Inject 0.5 mg into the skin once a week. (Patient taking differently: Inject 0.5 mg into the skin every Wednesday. )    [DISCONTINUED] SYRINGE-NEEDLE, DISP, 3 ML (B-D 3CC LUER-LOK SYR 25GX1) 25G X 1 3 ML MISC 1 each by Does not apply route every 14 (fourteen) days.    [DISCONTINUED] traZODone  (DESYREL ) 100 MG tablet Take 200 mg by mouth at bedtime.     [DISCONTINUED] TRINTELLIX  20 MG TABS tablet Take 20 mg by mouth daily.     [DISCONTINUED] Vitamin D , Ergocalciferol , (DRISDOL ) 1.25 MG (50000 UNIT) CAPS capsule TAKE 1  CAPSULE (50,000 UNITS TOTAL) BY MOUTH EVERY 7 (SEVEN) DAYS. TAKE 1 CAPSULE EVERY 7 DAYS (Patient taking differently: Take 50,000 Units by mouth every Saturday. )    [DISCONTINUED] metoprolol  tartrate (LOPRESSOR ) 100 MG tablet Take 1 tablet (100 mg total) by mouth once for 1 dose. Take 2 hours prior to your CT scan.    [DISCONTINUED] omeprazole  (PRILOSEC) 40 MG capsule TAKE 1 CAPSULE (40 MG TOTAL) BY MOUTH 2 (TWO) TIMES DAILY BEFORE A MEAL.    No facility-administered encounter medications on file as of 09/08/2019.     Immunization History  Administered Date(s) Administered   Influenza,inj,Quad PF,6+ Mos 11/05/2014, 11/11/2015, 11/19/2016, 12/02/2017, 12/18/2018   Influenza-Unspecified 11/25/2013   Pneumococcal Conjugate-13 11/19/2016   Pneumococcal Polysaccharide-23 11/02/2011   Tdap 04/13/2009, 12/18/2018       Objective:   Physical Exam BP 126/76 (BP Location: Left Arm, Cuff Size: Normal)   Pulse 77   Temp 97.7 F (36.5 C) (Temporal)   Ht 5' 1 (1.549 m)   Wt 192 lb 3.2 oz (87.2 kg)   SpO2 97%   BMI 36.32 kg/m  GENERAL: Well-developed, obese, no respiratory distress.  Fully ambulatory, awake and alert. HEAD: Normocephalic, atraumatic.  EYES: Pupils equal, round, reactive to light.  No scleral icterus.  MOUTH: Nose/mouth/throat not examined due to masking requirements for COVID 19. NECK: Supple. No thyromegaly. Trachea midline. No JVD.  No adenopathy. PULMONARY: No adventitious sounds.  Good air entry bilaterally CARDIOVASCULAR: S1 and S2.  Rhythm for the most part regular with frequent extrasystoles and pauses, query sinus arrhythmia.  Grade 1 through 2/6 systolic ejection murmur left sternal border. GASTROINTESTINAL: Truncal obesity MUSCULOSKELETAL: No joint deformity, no clubbing, 1+ edema of the lower extremities (feet/ankles ( NEUROLOGIC: Awake, alert, fully ambulatory with no gait disturbance noted.  Speech is fluent.  Oriented.  No overt focal deficits. SKIN:  Intact,warm,dry. PSYCH: Mood and behavior appropriate

## 2019-09-11 ENCOUNTER — Encounter: Payer: Self-pay | Admitting: Cardiology

## 2019-09-11 ENCOUNTER — Other Ambulatory Visit: Payer: Self-pay

## 2019-09-11 ENCOUNTER — Ambulatory Visit (INDEPENDENT_AMBULATORY_CARE_PROVIDER_SITE_OTHER): Payer: No Typology Code available for payment source | Admitting: Cardiology

## 2019-09-11 VITALS — BP 120/82 | HR 73 | Ht 61.0 in | Wt 191.4 lb

## 2019-09-11 DIAGNOSIS — R0600 Dyspnea, unspecified: Secondary | ICD-10-CM | POA: Diagnosis not present

## 2019-09-11 DIAGNOSIS — I2584 Coronary atherosclerosis due to calcified coronary lesion: Secondary | ICD-10-CM

## 2019-09-11 DIAGNOSIS — I2510 Atherosclerotic heart disease of native coronary artery without angina pectoris: Secondary | ICD-10-CM | POA: Diagnosis not present

## 2019-09-11 DIAGNOSIS — R0609 Other forms of dyspnea: Secondary | ICD-10-CM

## 2019-09-11 DIAGNOSIS — E7800 Pure hypercholesterolemia, unspecified: Secondary | ICD-10-CM | POA: Diagnosis not present

## 2019-09-11 MED ORDER — ASPIRIN EC 81 MG PO TBEC: 81.0000 mg | DELAYED_RELEASE_TABLET | Freq: Every day | ORAL | 0 refills | Status: DC

## 2019-09-11 MED ORDER — ATORVASTATIN CALCIUM 20 MG PO TABS
20.0000 mg | ORAL_TABLET | Freq: Every day | ORAL | 6 refills | Status: DC
Start: 2019-09-11 — End: 2020-03-14

## 2019-09-11 NOTE — Progress Notes (Signed)
Cardiology Office Note:    Date:  09/11/2019   ID:  Claire [name], DOB [birth year], MRN [id number]  PCP:  Steele Sizer, MD  [facility] Cardiologist:  Kate Sable, MD  [place] Electrophysiologist:  None   Referring MD: Steele Sizer, MD   Chief Complaint  Patient presents with  . office visit    F/U after cardiac CT; Meds verbally reviewed with patient.    History of Present Illness:    Claire [name] is a 49 y.o. female with a hx of anxiety, diabetes, depression, hyperlipidemia, family history of CAD who presents for follow-up.  Patient was last seen due to shortness of breath and arrhythmias.  Shortness of breath started after having COVID-19 infection.  Due to risk factors including hyperlipidemia and strong family history of early CAD, coronary CTA was ordered.  Prior echocardiogram on 07/2019 showed normal systolic and diastolic function, EF 60 to 65%.  Patient feels well, states her symptoms of shortness of breath have improved.   Past Medical History:  Diagnosis Date  . Anemia   . Anxiety   . Arthritis    hands, hips  . Asthma   . Bilateral leg cramps   . Depression   . Diabetes mellitus without complication ([place])    history no longer a problem since weight loss surgery  . DJD (degenerative joint disease)   . GERD (gastroesophageal reflux disease)   . Headache    Migraines  . IBS (irritable bowel syndrome)   . Kidney stone   . Low serum vitamin B12   . Lung nodules   . Migraine    down to approx 4x/mo since starting meds  . Muscle spasm   . Psoriasis    vaginal area  . Seasonal allergies   . Sleep apnea    history no longer a problem since weight loss surgery  . Vasovagal syncope    [place], Dr. Alveria Apley   . Vitamin D deficiency     Past Surgical History:  Procedure Laterality Date  . BLADDER SURGERY  2010  . BREAST BIOPSY Right 2014   stereotatic biopsy  . CHOLECYSTECTOMY  2010  . COLONOSCOPY  2014    Done at [facility]  .  COLONOSCOPY WITH PROPOFOL N/A 01/28/2018   Procedure: COLONOSCOPY WITH PROPOFOL;  Surgeon: Virgel Manifold, MD;  Location: [place];  Service: Endoscopy;  Laterality: N/A;  . DILITATION & CURRETTAGE/HYSTROSCOPY WITH NOVASURE ABLATION N/A 06/16/2015   Procedure: DILATATION & CURETTAGE/HYSTEROSCOPY WITH NOVASURE ABLATION;  Surgeon: Brien Few, MD;  Location: [facility];  Service: Gynecology;  Laterality: N/A;  . ESOPHAGOGASTRODUODENOSCOPY (EGD) WITH PROPOFOL N/A 01/28/2018   Procedure: ESOPHAGOGASTRODUODENOSCOPY (EGD) WITH BIOPSIES;  Surgeon: Virgel Manifold, MD;  Location: [place];  Service: Endoscopy;  Laterality: N/A;  . [place] RESECTION  2012  . LAPAROSCOPY N/A 06/16/2015   Procedure: LAPAROSCOPY DIAGNOSTIC;  Surgeon: Brien Few, MD;  Location: [facility];  Service: Gynecology;  Laterality: N/A;  . LAPAROSCOPY N/A 04/01/2018   Procedure: LAPAROSCOPY DIAGNOSTIC ERAS PATHWAY ENTEROLYSIS, CECOPEXY;  Surgeon: Johnathan Hausen, MD;  Location: [facility];  Service: General;  Laterality: N/A;  . LITHOTRIPSY  12/11/2017   laser lithotripsy  . LYSIS OF ADHESION N/A 06/16/2015   Procedure: LYSIS OF ADHESION;  Surgeon: Brien Few, MD;  Location: [place];  Service: Gynecology;  Laterality: N/A;  . PLANTAR FASCIA SURGERY Bilateral   . ROBOTIC ASSISTED SALPINGO OOPHERECTOMY Right 06/16/2015   Procedure: ROBOTIC ASSISTED SALPINGO OOPHORECTOMY, EXCISION OF  RIGHT CUL DE [facility] MASS;  Surgeon: Brien Few, MD;  Location: [facility] [facility];  Service: Gynecology;  Laterality: Right;  . TONSILLECTOMY    . UPPER GI ENDOSCOPY      Current Medications: Current Meds  Medication Sig  . Accu-Chek FastClix Lancets MISC USE TO TEST UP TO 4 TIMES A DAY  . ACCU-CHEK GUIDE test strip   . ADDERALL XR 30 MG 24 hr capsule Take 30 mg by mouth daily.   Marland Kitchen albuterol (VENTOLIN HFA) 108 (90 Base) MCG/ACT inhaler TAKE 2 PUFFS EVERY 6 HOURS AS NEEDED FOR WHEEZING OR SHORTNESS OF BREATH.  (VENTOLIN NOT COVERED)  . Ascorbic Acid (VITAMIN C) 1000 MG tablet Take 1,000 mg by mouth.  . calcium carbonate (OS-CAL) 600 MG TABS tablet Take 600 mg by mouth daily with breakfast.  . clobetasol ointment (TEMOVATE) 6.81 % Apply 1 application topically daily as needed (psoriasis).   . cyanocobalamin (,VITAMIN B-12,) 1000 MCG/ML injection INJECT 1 ML (1,000 MCG TOTAL) INTO THE MUSCLE EVERY 14 (FOURTEEN) DAYS.  Marland Kitchen cyclobenzaprine (FLEXERIL) 10 MG tablet Take 10 mg by mouth at bedtime as needed for muscle spasms.  Marland Kitchen dicyclomine (BENTYL) 10 MG capsule TAKE 1 CAPSULE (10 MG TOTAL) BY MOUTH 4 (FOUR) TIMES DAILY - BEFORE MEALS AND AT BEDTIME.  . fluconazole (DIFLUCAN) 150 MG tablet Take 150 mg by mouth as needed.  . fluocinonide (LIDEX) 0.05 % external solution APPLY TO AFFECTED SCALP TWICE DAILY UNTIL CLEAR, THEN AS NEEDED  . ketoconazole (NIZORAL) 2 % shampoo PLEASE SEE ATTACHED FOR DETAILED DIRECTIONS  . Lactobacillus (ACIDOPHILUS PO) Take 1 tablet by mouth daily.  Marland Kitchen loratadine (CLARITIN) 10 MG tablet Take 10 mg by mouth daily.  . 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   . mometasone (NASONEX) 50 MCG/ACT nasal spray USE 2 SPRAYS IN EACH NOSTRIL AT BEDTIME AS NEEDED FOR ALLERGIES  . montelukast (SINGULAIR) 10 MG tablet TAKE 1 TABLET (10 MG TOTAL) BY MOUTH AT BEDTIME. TAKE 1 TABLET(10 MG) BY MOUTH AT BEDTIME  . pregabalin (LYRICA) 100 MG capsule Take 1 capsule (100 mg total) by mouth 3 (three) times daily.  . promethazine (PHENERGAN) 12.5 MG tablet TAKE 1 TABLET BY MOUTH EVERY 6 HOURS AS NEEDED FOR MIGRAINES WITH NAUSEA  . Semaglutide,0.25 or 0.5MG/DOS, (OZEMPIC, 0.25 OR 0.5 MG/DOSE,) 2 MG/1.5ML SOPN Inject 0.5 mg into the skin once a week.  . SYRINGE-NEEDLE, DISP, 3 ML (B-D 3CC LUER-LOK SYR 25GX1") 25G X 1" 3 ML MISC 1 each by Does not apply route every 14 (fourteen) days.  . traZODone (DESYREL) 100 MG tablet Take 100-200 mg by mouth at  bedtime as needed.  . TRINTELLIX 20 MG TABS tablet Take 20 mg by mouth every morning.   . Vitamin D, Ergocalciferol, (DRISDOL) 1.25 MG (50000 UNIT) CAPS capsule TAKE 1 CAPSULE (50,000 UNITS TOTAL) BY MOUTH EVERY 7 (SEVEN) DAYS. TAKE 1 CAPSULE EVERY 7 DAYS  . zonisamide (ZONEGRAN) 50 MG capsule Take 150 capsules by mouth daily.   . [DISCONTINUED] atorvastatin (LIPITOR) 10 MG tablet Take 1 tablet (10 mg total) by mouth daily.     Allergies:   Aspartame and phenylalanine, Gabapentin, Linzess [linaclotide], Other, Adhesive [tape], Cymbalta [duloxetine hcl], and Tapentadol   Social History   Socioeconomic History  . Marital status: Married    Spouse name: Merrilee Seashore   . Number of children: 3  . Years of education: Not on file  . Highest education level: Some college, no  degree  Occupational History  . Not on file  Tobacco Use  . Smoking status: Former Smoker    Packs/day: 1.00    Years: 10.00    Pack years: 10.00    Types: Cigarettes    Quit date: 02/19/1993    Years since quitting: 26.5  . Smokeless tobacco: Never Used  Vaping Use  . Vaping Use: Never used  Substance and Sexual Activity  . Alcohol use: Yes    Alcohol/week: 0.0 standard drinks    Comment: occasionally  . Drug use: No  . Sexual activity: Yes    Partners: Male    Comment: last sex 04 Jan 2018  Other Topics Concern  . Not on file  Social History Narrative  . Not on file   Social Determinants of Health   Financial Resource Strain:   . Difficulty of Paying Living Expenses:   Food Insecurity:   . Worried About Charity fundraiser in the Last Year:   . Arboriculturist in the Last Year:   Transportation Needs:   . Film/video editor (Medical):   Marland Kitchen Lack of Transportation (Non-Medical):   Physical Activity:   . Days of Exercise per Week:   . Minutes of Exercise per Session:   Stress:   . Feeling of Stress :   Social Connections:   . Frequency of Communication with Friends and Family:   . Frequency of Social  Gatherings with Friends and Family:   . Attends Religious Services:   . Active Member of Clubs or Organizations:   . Attends Archivist Meetings:   Marland Kitchen Marital Status:      Family History: The patient's family history includes Breast cancer in her paternal aunt and paternal grandmother; Heart attack in her maternal uncle, mother, paternal grandfather, and sister; Hyperlipidemia in her father; Lung cancer in her maternal grandfather; Multiple myeloma in her mother; Stroke in her mother.  ROS:   Please see the history of present illness.     All other systems reviewed and are negative.  EKGs/Labs/Other Studies Reviewed:    The following studies were reviewed today:   EKG:  EKG is  ordered today.  The ekg ordered today demonstrates normal sinus rhythm, normal ECG.  Recent Labs: 04/02/2019: ALT 21 04/20/2019: Hemoglobin 12.7; Platelets 379; TSH 0.018 08/19/2019: BUN 9; Potassium 4.0; Sodium 138 08/20/2019: Creatinine, Ser 0.60  Recent Lipid Panel    Component Value Date/Time   CHOL 179 12/31/2018 0857   TRIG 99 12/31/2018 0857   HDL 64 12/31/2018 0857   CHOLHDL 2.8 12/31/2018 0857   CHOLHDL 2.6 11/19/2016 0921   VLDL 20 11/11/2015 0846   LDLCALC 97 12/31/2018 0857   LDLCALC 72 11/19/2016 0921    Physical Exam:    VS:  BP 120/82 (BP Location: Left Arm, Patient Position: Sitting, Cuff Size: Normal)   Pulse 73   Ht _0  (1.549 m)   Wt 191 lb 6 oz (86.8 kg)   SpO2 98%   BMI 36.16 kg/m     Wt Readings from Last 3 Encounters:  09/11/19 191 lb 6 oz (86.8 kg)  09/08/19 192 lb 3.2 oz (87.2 kg)  08/07/19 190 lb 6.4 oz (86.4 kg)     GEN:  Well nourished, well developed in no acute distress HEENT: Normal NECK: No JVD; No carotid bruits LYMPHATICS: No lymphadenopathy CARDIAC: RRR, no murmurs, rubs, gallops RESPIRATORY:  Clear to auscultation without rales, wheezing or rhonchi  ABDOMEN: Soft, non-tender, non-distended MUSCULOSKELETAL:  No edema; No deformity  SKIN:  Warm and dry NEUROLOGIC:  Alert and oriented x 3 PSYCHIATRIC:  Normal affect   ASSESSMENT:    1. Dyspnea on exertion   2. Coronary artery calcification   3. Pure hypercholesterolemia    PLAN:    In order of problems listed above:  1. Patient with dyspnea on exertion, priorechocardiogram with normal systolic and diastolic function.  She has risk factors of CAD including hyperlipidemia, strong family history with with sister passing from a massive heart attack, MI in the mother, MI and death.  Coronary CTA showed a calcium score of 175, minimal stenosis in the left circumflex 0 to 24%, mild calcified plaque in the proximal LAD causing mild stenosis 25%.  Her symptoms likely secondary to post viral sequelae secondary to COVID-19 especially as there is improvement.  2. Elevated coronary calcium of 175, 99 percentile for age and gender matched controls.  Start aspirin 81 mg, increase Lipitor to 20 mg daily.  Last LDL was 97. 3. History of hyperlipidemia, increase Lipitor to 20 mg daily.  Repeat fasting lipid profile prior to follow-up visit  Follow-up 6 months  Total encounter time 40 minutes  Greater than 50% was spent in counseling and coordination of care with the patient  Medication Adjustments/Labs and Tests Ordered: Current medicines are reviewed at length with the patient today.  Concerns regarding medicines are outlined above.  Orders Placed This Encounter  Procedures  . Lipid Profile  . EKG 12-Lead   Meds ordered this encounter  Medications  . aspirin EC 81 MG tablet    Sig: Take 1 tablet (81 mg total) by mouth daily.    Dispense:  30 tablet    Refill:  0  . atorvastatin (LIPITOR) 20 MG tablet    Sig: Take 1 tablet (20 mg total) by mouth daily.    Dispense:  30 tablet    Refill:  6    Dose increase    Patient Instructions  Medication Instructions:  - Your physician has recommended you make the following change in your medication:   1) START aspirin 81 mg- take 1  tablet by mouth once daily  2) INCREASE lipitor (atorvastatin) to 20 mg- take 1 tablet by mouth once daily  *If you need a refill on your cardiac medications before your next appointment, please call your pharmacy*   Lab Work: - Your physician recommends that you return for a FASTING lipid profile: 1-2 weeks prior to your next appointment with Dr. Garen Lah  - Medical Mall at [facility] - 1st desk on the right (Registration) to check in - Lab hours: Monday- Friday (7:30 am- 5:30 pm) - Do not eat/ drink anything for 8 hours prior, except you may have water/ black coffee  If you have labs (blood work) drawn today and your tests are completely normal, you will receive your results only by: Marland Kitchen MyChart Message (if you have MyChart) OR . A paper copy in the mail If you have any lab test that is abnormal or we need to change your treatment, we will call you to review the results.   Testing/Procedures: - none ordered   Follow-Up: At [facility], you and your health needs are our priority.  As part of our continuing mission to provide you with exceptional heart care, we have created designated Provider Care Teams.  These Care Teams include your primary Cardiologist (physician) and Advanced Practice Providers (APPs -  Physician Assistants and Nurse Practitioners) who all work together  to provide you with the care you need, when you need it.  We recommend signing up for the patient portal called "MyChart".  Sign up information is provided on this After Visit Summary.  MyChart is used to connect with patients for Virtual Visits (Telemedicine).  Patients are able to view lab/test results, encounter notes, upcoming appointments, etc.  Non-urgent messages can be sent to your provider as well.   To learn more about what you can do with MyChart, go to [email or web address].    Your next appointment:   6 month(s)  The format for your next appointment:   In Person  Provider:   Kate Sable,  MD   Other Instructions N/a      Signed, Kate Sable, MD  09/11/2019 12:35 PM    [place]

## 2019-09-11 NOTE — Patient Instructions (Signed)
Medication Instructions:  - Your physician has recommended you make the following change in your medication:   1) START aspirin 81 mg- take 1 tablet by mouth once daily  2) INCREASE lipitor (atorvastatin) to 20 mg- take 1 tablet by mouth once daily  *If you need a refill on your cardiac medications before your next appointment, please call your pharmacy*   Lab Work: - Your physician recommends that you return for a FASTING lipid profile: 1-2 weeks prior to your next appointment with Dr. Garen Lah  - Medical Mall at [facility] - 1st desk on the right (Registration) to check in - Lab hours: Monday- Friday (7:30 am- 5:30 pm) - Do not eat/ drink anything for 8 hours prior, except you may have water/ black coffee  If you have labs (blood work) drawn today and your tests are completely normal, you will receive your results only by: Marland Kitchen MyChart Message (if you have MyChart) OR . A paper copy in the mail If you have any lab test that is abnormal or we need to change your treatment, we will call you to review the results.   Testing/Procedures: - none ordered   Follow-Up: At [facility], you and your health needs are our priority.  As part of our continuing mission to provide you with exceptional heart care, we have created designated Provider Care Teams.  These Care Teams include your primary Cardiologist (physician) and Advanced Practice Providers (APPs -  Physician Assistants and Nurse Practitioners) who all work together to provide you with the care you need, when you need it.  We recommend signing up for the patient portal called "MyChart".  Sign up information is provided on this After Visit Summary.  MyChart is used to connect with patients for Virtual Visits (Telemedicine).  Patients are able to view lab/test results, encounter notes, upcoming appointments, etc.  Non-urgent messages can be sent to your provider as well.   To learn more about what you can do with MyChart, go to  [email or web address].    Your next appointment:   6 month(s)  The format for your next appointment:   In Person  Provider:   Kate Sable, MD   Other Instructions N/a

## 2019-09-14 ENCOUNTER — Encounter: Payer: Self-pay | Admitting: Family Medicine

## 2019-09-15 ENCOUNTER — Other Ambulatory Visit: Payer: Self-pay | Admitting: Family Medicine

## 2019-09-15 IMAGING — CT CT ABD-PELV W/ CM
2 of 5 series · 16 of 46 positions shown, 18 images · IV contrast (APPLIED)
Comparison: CT abdomen pelvis dated July 12, 2010.

CLINICAL DATA: Right lower quadrant pain with diarrhea and nausea
for the past week.

EXAM:
CT ABDOMEN AND PELVIS WITH CONTRAST
TECHNIQUE: Multidetector CT imaging of the abdomen and pelvis was performed
using the standard protocol following bolus administration of
intravenous contrast.
CONTRAST:  100mL RRMGDL-8BB IOPAMIDOL (RRMGDL-8BB) INJECTION 61%

[Series 2: axial st · axial · 0.79mm/px · z∈[-950,-550]mm · 13 of 91 slices shown, 15 images]
[im 6/91  soft-tissue]
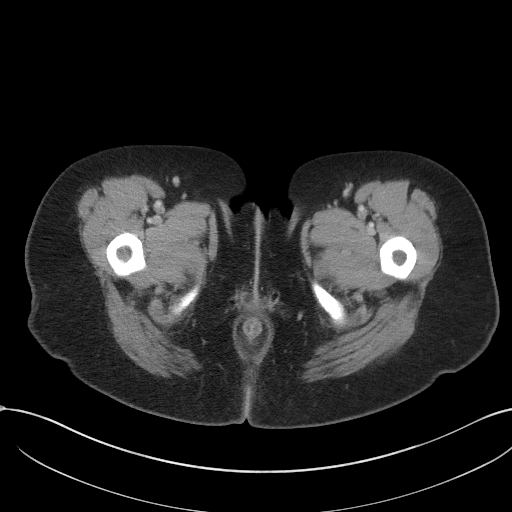
[im 6/91  bone]
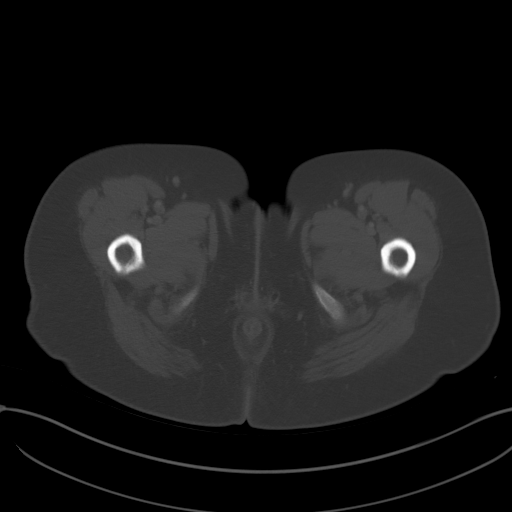
[im 11/91  soft-tissue]
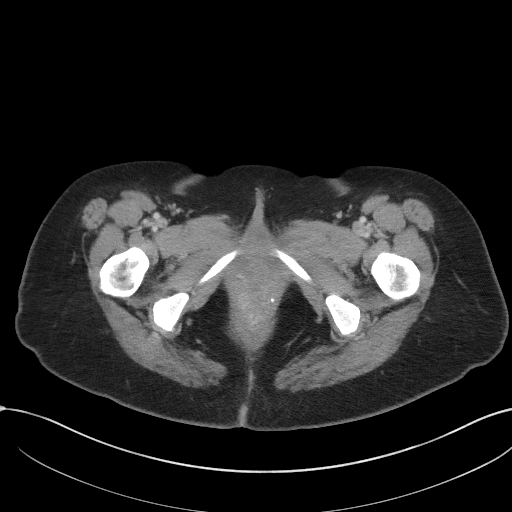
[im 21/91  soft-tissue]
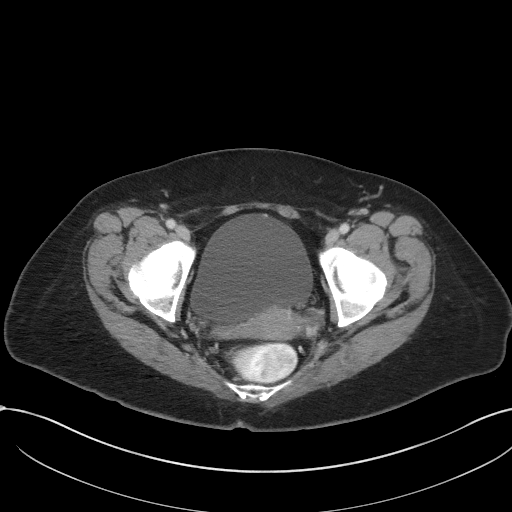
[im 26/91  soft-tissue]
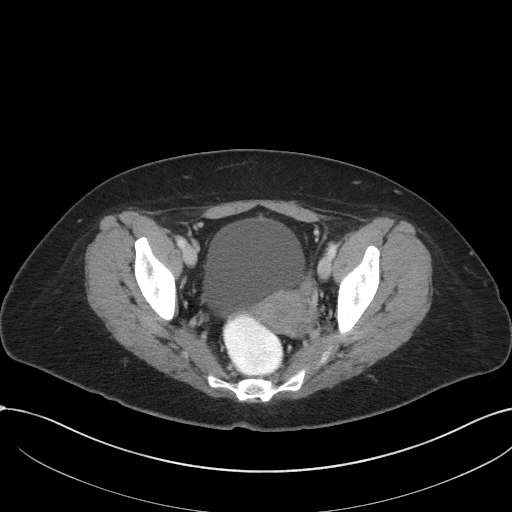
[im 31/91  soft-tissue]
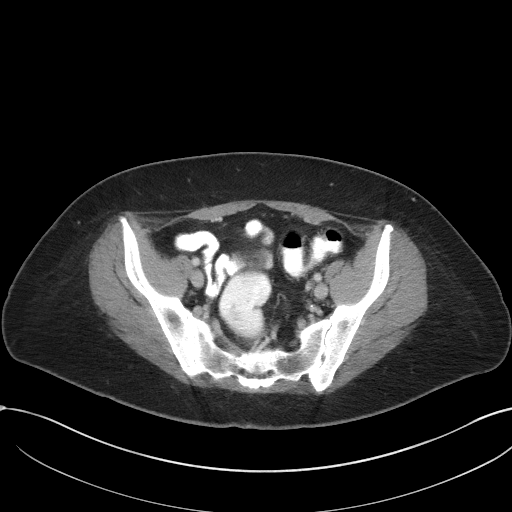
[im 41/91  soft-tissue]
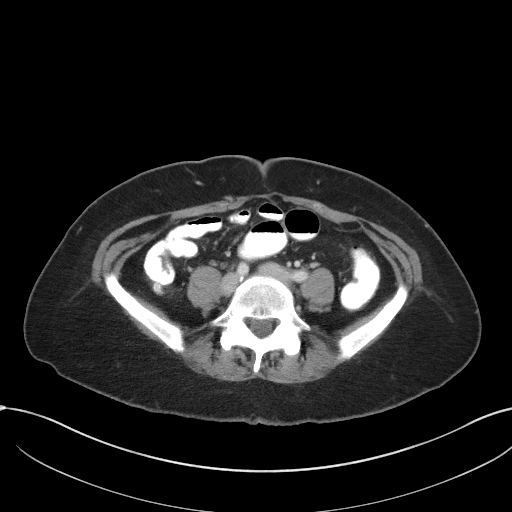
[im 46/91  soft-tissue]
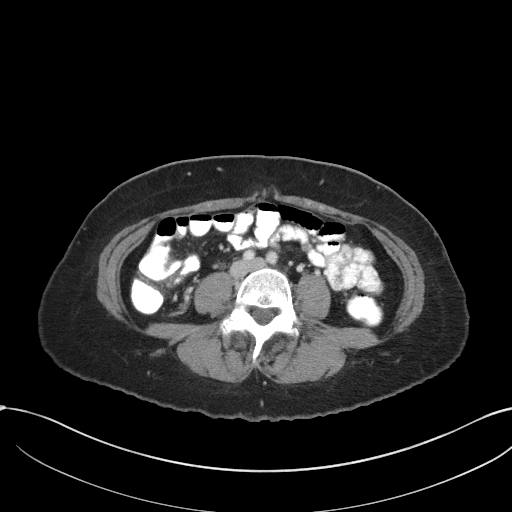
[im 51/91  soft-tissue]
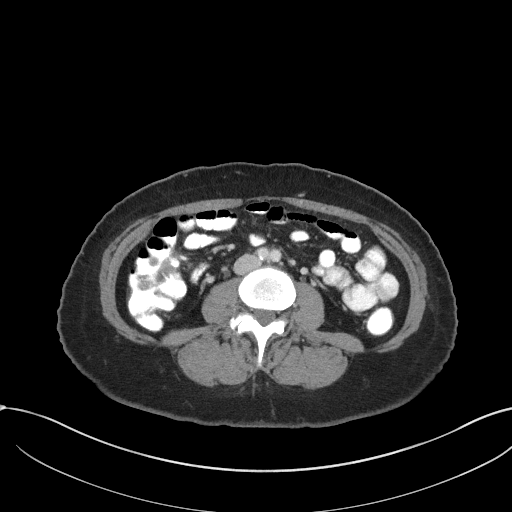
[im 61/91  soft-tissue]
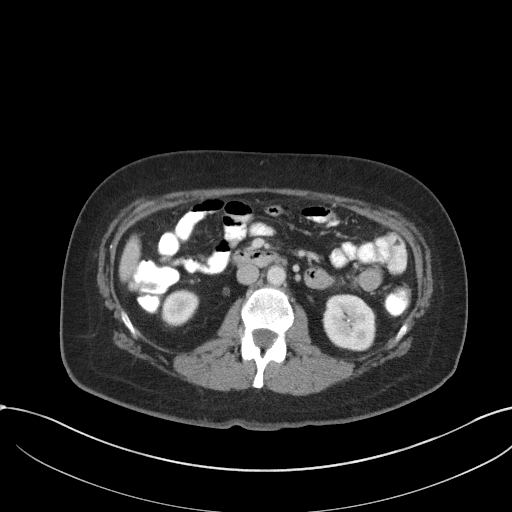
[im 61/91  bone]
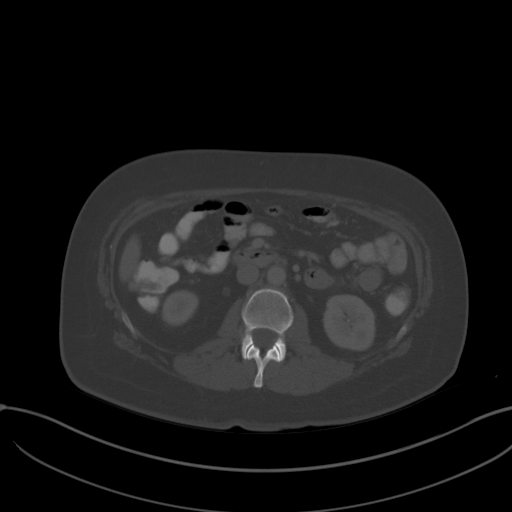
[im 66/91  soft-tissue]
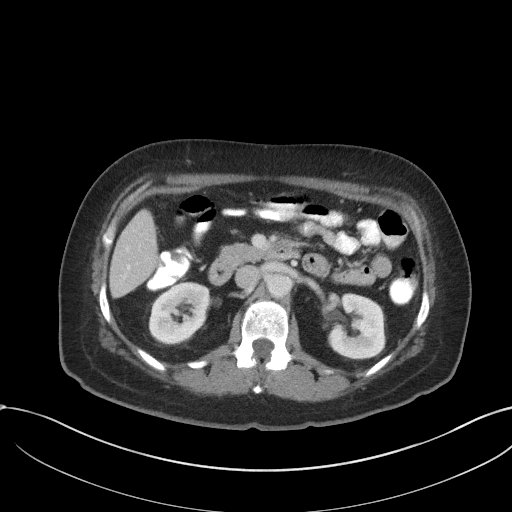
[im 71/91  soft-tissue]
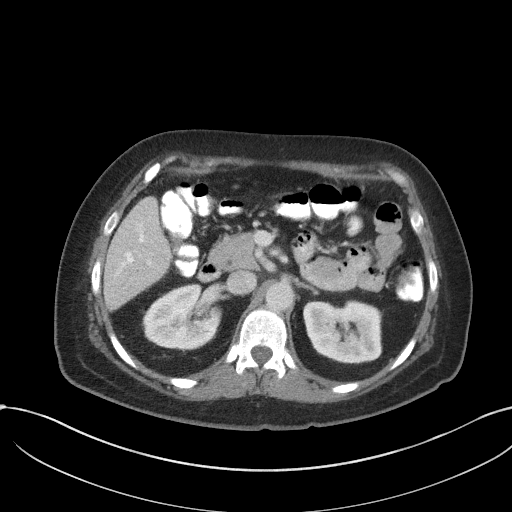
[im 81/91  soft-tissue]
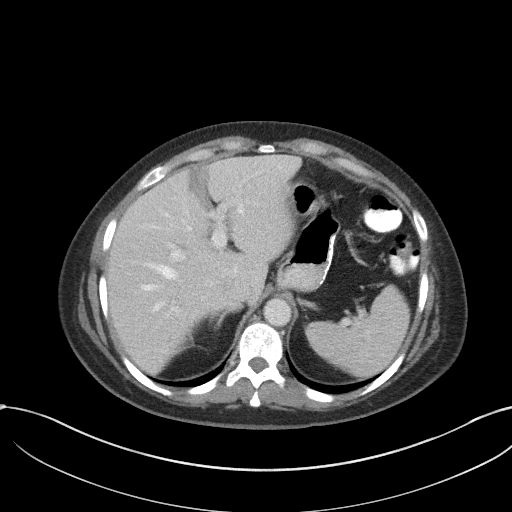
[im 86/91  soft-tissue]
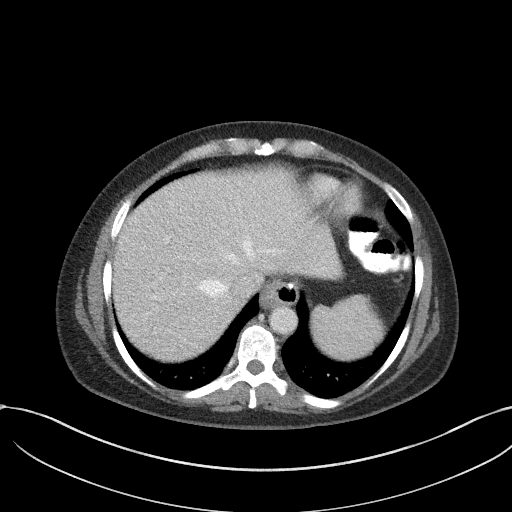

[Series 5: coronal st · coronal · 0.82mm/px · 3 of 74 slices shown]
[im 25/74  soft-tissue]
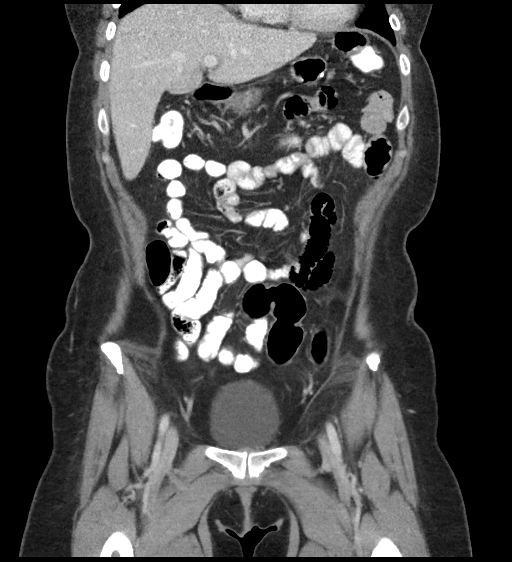
[im 33/74  soft-tissue]
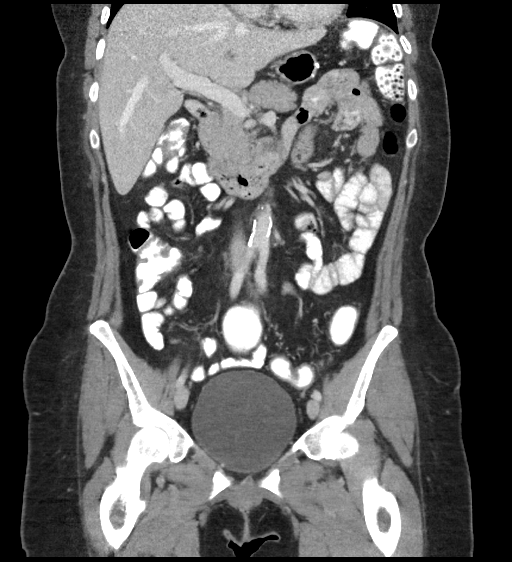
[im 41/74  soft-tissue]
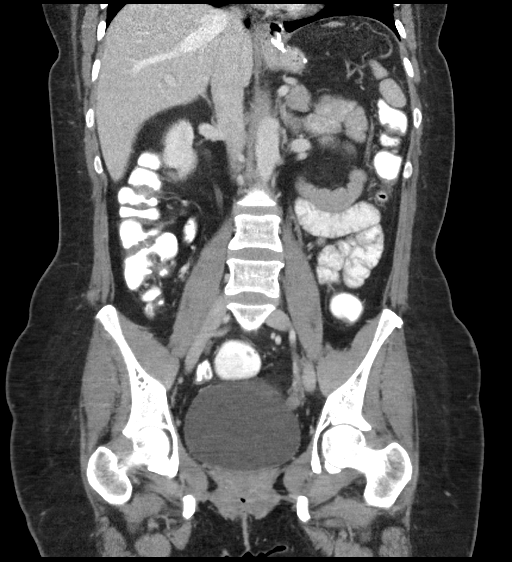

[16 of 46 positions shown; findings below may reference images not displayed]

FINDINGS: Lower chest: No acute abnormality.

Hepatobiliary: The liver dome is not included in the field of view.
Focal fatty deposition along the falciform ligament. Prior
cholecystectomy. No biliary dilatation.

Pancreas: Unremarkable. No pancreatic ductal dilatation or
surrounding inflammatory changes.

Spleen: Normal in size without focal abnormality.

Adrenals/Urinary Tract: The adrenal glands are unremarkable. There
is a 7 mm nonobstructive calculus in the lower pole right kidney.
Punctate nonobstructive calculus in the lower pole left kidney. No
hydronephrosis. Bladder is unremarkable.

Stomach/Bowel: Small hiatal hernia. Prior sleeve gastrectomy. No
bowel wall thickening, distention, or surrounding inflammatory
changes. Normal appendix.

Vascular/Lymphatic: Aortic atherosclerosis. No enlarged abdominal or
pelvic lymph nodes.

Reproductive: The uterus is unremarkable. Prior right oophorectomy.
Corpus luteum in the left ovary.

Other: No free fluid or pneumoperitoneum.

Musculoskeletal: No acute or significant osseous findings.
IMPRESSION: 1.  No acute intra-abdominal process.  Normal appendix.
2. Prior sleeve gastrectomy. Small hiatal hernia. No evidence of
bowel obstruction.
3. Bilateral nonobstructive renal calculi.
4.  Aortic atherosclerosis ([id number].V).

## 2019-09-15 MED ORDER — FAMOTIDINE 20 MG PO TABS
20.0000 mg | ORAL_TABLET | Freq: Two times a day (BID) | ORAL | 0 refills | Status: DC
Start: 2019-09-15 — End: 2019-11-04

## 2019-09-18 ENCOUNTER — Encounter: Payer: Self-pay | Admitting: Family Medicine

## 2019-09-21 ENCOUNTER — Other Ambulatory Visit: Payer: Self-pay | Admitting: Gastroenterology

## 2019-09-21 DIAGNOSIS — K219 Gastro-esophageal reflux disease without esophagitis: Secondary | ICD-10-CM

## 2019-09-30 ENCOUNTER — Other Ambulatory Visit: Payer: Self-pay | Admitting: Family Medicine

## 2019-09-30 DIAGNOSIS — E538 Deficiency of other specified B group vitamins: Secondary | ICD-10-CM

## 2019-09-30 NOTE — Telephone Encounter (Signed)
Requested medications are due for refill today?  Yes - Medication not assigned to a protocol.    Requested medications are on active medication list? Yes  Last Refill:  07/11/2019  # 6 Ml with no refills  Future visit scheduled?  Yes  Notes to Clinic:  Off protocol medication

## 2019-10-13 ENCOUNTER — Encounter: Payer: Self-pay | Admitting: Family Medicine

## 2019-10-13 ENCOUNTER — Other Ambulatory Visit: Payer: Self-pay | Admitting: Family Medicine

## 2019-10-13 DIAGNOSIS — I700 Atherosclerosis of aorta: Secondary | ICD-10-CM

## 2019-10-19 NOTE — Patient Instructions (Addendum)
DUE TO COVID-19 ONLY ONE VISITOR IS ALLOWED TO COME WITH YOU AND STAY IN THE WAITING ROOM ONLY DURING PRE OP AND PROCEDURE DAY OF SURGERY. THE 1 VISITOR  MAY VISIT WITH YOU AFTER SURGERY IN YOUR PRIVATE ROOM DURING VISITING HOURS ONLY!  YOU NEED TO HAVE A COVID 19 TEST ON: 10/30/19 @ 3:00 pm, THIS TEST MUST BE DONE BEFORE SURGERY,  COVID TESTING SITE [place] [address], IT IS ON THE RIGHT GOING OUT [address] APPROXIMATELY  2 MINUTES PAST [employer] ON THE RIGHT. ONCE YOUR COVID TEST IS COMPLETED,  PLEASE BEGIN THE QUARANTINE INSTRUCTIONS AS OUTLINED IN YOUR HANDOUT.                Claire [name]    Your procedure is scheduled on: 11/03/19   Report to [facility] Main  Entrance   Report to short stay at: 5:30 AM     Call this number if you have problems the morning of surgery [phone]    Remember: Do not eat food or drink liquids :After Midnight.   BRUSH YOUR TEETH MORNING OF SURGERY AND RINSE YOUR MOUTH OUT, NO CHEWING GUM CANDY OR MINTS.     Take these medicines the morning of surgery with A SIP OF WATER: famotidine,metropolol,omeprazole,pregabalin,loratadine,trintellix.Use inhalers as usual.  DO NOT TAKE ANY DIABETIC MEDICATIONS DAY OF YOUR SURGERY                               You may not have any metal on your body including hair pins and              piercings  Do not wear jewelry, make-up, lotions, powders or perfumes, deodorant             Do not wear nail polish on your fingernails.  Do not shave  48 hours prior to surgery.              Do not bring valuables to the hospital. [place].  Contacts, dentures or bridgework may not be worn into surgery.  Leave suitcase in the car. After surgery it may be brought to your room.     Patients discharged the day of surgery will not be allowed to drive home. IF YOU ARE HAVING SURGERY AND GOING HOME THE SAME DAY, YOU MUST HAVE AN  ADULT TO DRIVE YOU HOME AND BE WITH YOU FOR 24 HOURS. YOU MAY GO HOME BY TAXI OR UBER OR ORTHERWISE, BUT AN ADULT MUST ACCOMPANY YOU HOME AND STAY WITH YOU FOR 24 HOURS.  Name and phone number of your driver:  Special Instructions: N/A              Please read over the following fact sheets you were given: _____________________________________________________________________         [facility] - Preparing for Surgery Before surgery, you can play an important role.  Because skin is not sterile, your skin needs to be as free of germs as possible.  You can reduce the number of germs on your skin by washing with CHG (chlorahexidine gluconate) soap before surgery.  CHG is an antiseptic cleaner which kills germs and bonds with the skin to continue killing germs even after washing. Please DO NOT use if you have an allergy to CHG  or antibacterial soaps.  If your skin becomes reddened/irritated stop using the CHG and inform your nurse when you arrive at Short Stay. Do not shave (including legs and underarms) for at least 48 hours prior to the first CHG shower.  You may shave your face/neck. Please follow these instructions carefully:  1.  Shower with CHG Soap the night before surgery and the  morning of Surgery.  2.  If you choose to wash your hair, wash your hair first as usual with your  normal  shampoo.  3.  After you shampoo, rinse your hair and body thoroughly to remove the  shampoo.                           4.  Use CHG as you would any other liquid soap.  You can apply chg directly  to the skin and wash                       Gently with a scrungie or clean washcloth.  5.  Apply the CHG Soap to your body ONLY FROM THE NECK DOWN.   Do not use on face/ open                           Wound or open sores. Avoid contact with eyes, ears mouth and genitals (private parts).                       Wash face,  Genitals (private parts) with your normal soap.             6.  Wash thoroughly, paying special  attention to the area where your surgery  will be performed.  7.  Thoroughly rinse your body with warm water from the neck down.  8.  DO NOT shower/wash with your normal soap after using and rinsing off  the CHG Soap.                9.  Pat yourself dry with a clean towel.            10.  Wear clean pajamas.            11.  Place clean sheets on your bed the night of your first shower and do not  sleep with pets. Day of Surgery : Do not apply any lotions/deodorants the morning of surgery.  Please wear clean clothes to the hospital/surgery center.  FAILURE TO FOLLOW THESE INSTRUCTIONS MAY RESULT IN THE CANCELLATION OF YOUR SURGERY PATIENT SIGNATURE_________________________________  NURSE SIGNATURE__________________________________  ________________________________________________________________________

## 2019-10-19 NOTE — Progress Notes (Signed)
Pt. Needs orders for the upcoming surgery.PST appointment on 10/28/19.Thanks.

## 2019-10-20 ENCOUNTER — Other Ambulatory Visit: Payer: Self-pay | Admitting: Surgery

## 2019-10-20 DIAGNOSIS — K2100 Gastro-esophageal reflux disease with esophagitis, without bleeding: Secondary | ICD-10-CM

## 2019-10-20 NOTE — Progress Notes (Signed)
Name: Claire [name]   MRN: [id number]    DOB: 1970-10-08   Date:10/21/2019       Progress Note  Subjective  Chief Complaint  Chief Complaint  Patient presents with  . Diabetes  . Fibromyalgia  . Gastroesophageal Reflux  . Asthma   Virtual Visit via Telephone Note  I connected with Claire [name] on 10/21/19 at  7:40 AM EDT by telephone and verified that I am speaking with the correct person using two identifiers.  Location: Patient: at hotel  Provider: at home    I discussed the limitations, risks, security and privacy concerns of performing an evaluation and management service by telephone and the availability of in person appointments. I also discussed with the patient that there may be a patient responsible charge related to this service. The patient expressed understanding and agreed to proceed.   HPI  Major Depression:she is now seeing Dr. Maryruth [name] who is also giving her therapy.She is off Zolot and is currently on Trintelix 20 mg and off Lunesta and taking Trazodone for sleep and is sleeping well now .She had side effects with Ambien ( interrupted sleep and sleep walking) , or Temazepam ( groggy ).She is following Dr. Shelda [name] recommendation. She had increase anxiety last week, but states she think she skipped taking medication,  . Asthma MildIntermittentt:she states symptoms have been controlled now, not using inhaler lately, it has been a [name] months, no sob, wheezing or cough   Migraine: she is still seeing Dr. Neale [name] -she is onZonegran, also taking promethazine . She is doing better now, she has on average 2 at most 3  episodes per month, she has tension headaches intermittent  but not severe   DMII: diagnosed at age 12, took medication for a while, but stopped all medications 10/25/2010 after bariatric surgery,glucose at home has been well controlled,no polyphagia, polyuria or polydipsia. Maximum weight of 265 lbs and wasdown to 118.3lbs - lowest  weight back in 08/2017),she is now gaining weight Weight is up to197 lbs today, gained 10 lbs since last time we checked in our office.   IBS/GERD history of bypass surgery: She was doing well after lysis of adhesions and cecopexy back in 04/01/2018.However recently had to go back to [name] with severe abdominal pain, she has since seen GI and was switched from Pantoprazole to Omeprazole and was given Bentyl o take prn before meals, she still has reflux symptoms. She is scheduled to have bariatric surgery revision and repair the hiatal hernia by Dr. Daphine [name] .   Atherosclerosis of aorta and hyperlipidemia: on statin therapy, no side effects, no chest pain or palpitation. She will have labs done before next visit   B12 deficiency: shehas been getting B12 injections weekly since Nov and last level was at goal at 808. Recheck yearly   Thyroiditis: seeing Claire [name], no pain or swelling at this time, last TSH done by Dr. Turner Daniels is August was normal    Dizziness/syncope: seen at [facility], and has seen Dr. Christie [name] 03/2019 , they ruled out POTS but may have had vasovagal syncope. No symptoms lately , she gets up slowly and is staying hydrated  . FMS: she is still having pain, but she noticed improvement of pain, she is on Lyrica 100 mg at night and 50 in am, we will adjust the dose today    Patient Active Problem List   Diagnosis Date Noted  . B12 deficiency 04/10/2018  . Columnar epithelial-lined lower esophagus   . Postprocedural  disorder of digestive system   . Pain, abdominal, epigastric   . Esophageal dysphagia   . Generalized abdominal pain   . Persistent mood (affective) disorder, unspecified ([place]) 01/15/2018  . Insomnia related to another mental disorder 01/15/2018  . OCD (obsessive compulsive disorder) 10/29/2017  . GAD (generalized anxiety disorder) 10/29/2017  . Nephrolithiasis 10/22/2017  . Atherosclerosis of aorta ([place]) 12/21/2016  . Sacroiliac inflammation ([place])  11/19/2016  . Hiatal hernia 08/17/2016  . Diverticulosis of colon 08/17/2016  . Iron deficiency anemia 08/08/2015  . Lumbosacral pain 01/07/2015  . Muscle twitching 01/07/2015  . Intertrigo 11/05/2014  . Perennial allergic rhinitis 09/03/2014  . Lung nodules 09/03/2014  . Vitamin D deficiency 09/03/2014  . Diet-controlled diabetes mellitus ([place]) 09/03/2014  . History of kidney stones 09/03/2014  . Chronic insomnia 09/03/2014  . History of iron deficiency anemia 09/03/2014  . Depression with anxiety 09/03/2014  . Palpitations 06/22/2014  . Bariatric surgery status 12/03/2013  . Asthma, mild persistent 11/21/2012  . CN (constipation) 11/21/2012  . GERD without esophagitis 11/21/2012  . History of hyperlipidemia 11/21/2012  . Migraine with aura and without status migrainosus 11/21/2012  . History of sleep apnea 11/21/2012  . IBS (irritable bowel syndrome) 08/15/2012    Past Surgical History:  Procedure Laterality Date  . BLADDER SURGERY  2010  . BREAST BIOPSY Right 2014   stereotatic biopsy  . CHOLECYSTECTOMY  2010  . COLONOSCOPY  2014    [facility] at [facility]  . COLONOSCOPY WITH PROPOFOL N/A 01/28/2018   Procedure: COLONOSCOPY WITH PROPOFOL;  Surgeon: Claire Manifold, MD;  Location: [facility];  Service: Endoscopy;  Laterality: N/A;  . DILITATION & CURRETTAGE/HYSTROSCOPY WITH NOVASURE ABLATION N/A 06/16/2015   Procedure: DILATATION & CURETTAGE/HYSTEROSCOPY WITH NOVASURE ABLATION;  Surgeon: Claire Few, MD;  Location: [facility];  Service: Gynecology;  Laterality: N/A;  . ESOPHAGOGASTRODUODENOSCOPY (EGD) WITH PROPOFOL N/A 01/28/2018   Procedure: ESOPHAGOGASTRODUODENOSCOPY (EGD) WITH BIOPSIES;  Surgeon: Claire Manifold, MD;  Location: [facility];  Service: Endoscopy;  Laterality: N/A;  . [place] RESECTION  2012  . LAPAROSCOPY N/A 06/16/2015   Procedure: LAPAROSCOPY DIAGNOSTIC;  Surgeon: Claire Few, MD;  Location: [facility];  Service:  Gynecology;  Laterality: N/A;  . LAPAROSCOPY N/A 04/01/2018   Procedure: LAPAROSCOPY DIAGNOSTIC ERAS PATHWAY ENTEROLYSIS, CECOPEXY;  Surgeon: Claire Hausen, MD;  Location: [facility];  Service: General;  Laterality: N/A;  . LITHOTRIPSY  12/11/2017   laser lithotripsy  . LYSIS OF ADHESION N/A 06/16/2015   Procedure: LYSIS OF ADHESION;  Surgeon: Claire Few, MD;  Location: [facility];  Service: Gynecology;  Laterality: N/A;  . PLANTAR FASCIA SURGERY Bilateral   . ROBOTIC ASSISTED SALPINGO OOPHERECTOMY Right 06/16/2015   Procedure: ROBOTIC ASSISTED SALPINGO OOPHORECTOMY, EXCISION OF RIGHT CUL DE [place] MASS;  Surgeon: Claire Few, MD;  Location: [facility];  Service: Gynecology;  Laterality: Right;  . TONSILLECTOMY    . UPPER GI ENDOSCOPY      Family History  Problem Relation Age of Onset  . Heart attack Mother   . Stroke Mother   . Multiple myeloma Mother   . Hyperlipidemia Father   . Heart attack Sister   . Heart attack Maternal Uncle   . Heart attack Paternal Grandfather   . Breast cancer Paternal Aunt   . Breast cancer Paternal Grandmother   . Lung cancer Maternal Grandfather     Social History   Tobacco Use  . Smoking status: Former Smoker    Packs/day: 1.00  Years: 10.00    Pack years: 10.00    Types: Cigarettes    Quit date: 02/19/1993    Years since quitting: 26.6  . Smokeless tobacco: Never Used  Substance Use Topics  . Alcohol use: Yes    Alcohol/week: 0.0 standard drinks    Comment: occasionally     Current Outpatient Medications:  .  Accu-Chek FastClix Lancets MISC, USE TO TEST UP TO 4 TIMES A DAY, Disp: , Rfl:  .  ACCU-CHEK GUIDE test strip, , Disp: , Rfl:  .  ADDERALL XR 30 MG 24 hr capsule, Take 30 mg by mouth daily. , Disp: , Rfl:  .  albuterol (VENTOLIN HFA) 108 (90 Base) MCG/ACT inhaler, TAKE 2 PUFFS EVERY 6 HOURS AS NEEDED FOR WHEEZING OR SHORTNESS OF BREATH. (VENTOLIN NOT COVERED) (Patient taking differently: Inhale 2 puffs into the lungs every 6 (six) hours  as needed for wheezing or shortness of breath. ), Disp: 8.5 g, Rfl: 2 .  Ascorbic Acid (VITAMIN C) 1000 MG tablet, Take 1,000 mg by mouth., Disp: , Rfl:  .  aspirin EC 81 MG tablet, Take 1 tablet (81 mg total) by mouth daily., Disp: 30 tablet, Rfl: 0 .  atorvastatin (LIPITOR) 20 MG tablet, Take 1 tablet (20 mg total) by mouth daily., Disp: 30 tablet, Rfl: 6 .  calcium carbonate (OS-CAL) 600 MG TABS tablet, Take 600 mg by mouth daily with breakfast., Disp: , Rfl:  .  clobetasol ointment (TEMOVATE) 2.42 %, Apply 1 application topically daily as needed (psoriasis). , Disp: , Rfl: 0 .  cyanocobalamin (,VITAMIN B-12,) 1000 MCG/ML injection, INJECT 1 ML (1,000 MCG TOTAL) INTO THE MUSCLE EVERY 14 (FOURTEEN) DAYS. (Patient taking differently: Inject 1,000 mcg into the muscle every 14 (fourteen) days. Wednesdays), Disp: 6 mL, Rfl: 0 .  cyclobenzaprine (FLEXERIL) 10 MG tablet, Take 1 tablet (10 mg total) by mouth at bedtime. Prn (Patient taking differently: Take 10 mg by mouth at bedtime as needed (migraine). ), Disp: 90 tablet, Rfl: 1 .  dicyclomine (BENTYL) 10 MG capsule, TAKE 1 CAPSULE (10 MG TOTAL) BY MOUTH 4 (FOUR) TIMES DAILY - BEFORE MEALS AND AT BEDTIME., Disp: 90 capsule, Rfl: 1 .  famotidine (PEPCID) 20 MG tablet, Take 1 tablet (20 mg total) by mouth 2 (two) times daily., Disp: 180 tablet, Rfl: 0 .  fluconazole (DIFLUCAN) 150 MG tablet, Take 1 tablet (150 mg total) by mouth every other day. (Patient taking differently: Take 150 mg by mouth daily as needed (yeast infection.). ), Disp: 9 tablet, Rfl: 2 .  fluocinonide (LIDEX) 0.05 % external solution, Apply 1 application topically 2 (two) times daily as needed (psoriasis). , Disp: , Rfl:  .  ketoconazole (NIZORAL) 2 % shampoo, Apply 1 application topically 2 (two) times daily as needed (psoriasis). , Disp: , Rfl:  .  Lactobacillus (ACIDOPHILUS PO), Take 1 capsule by mouth daily. Probiotic plus Calcium, Disp: , Rfl:  .  loratadine (CLARITIN) 10 MG tablet,  Take 10 mg by mouth daily., Disp: , Rfl:  .  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 , Disp: , Rfl: 4 .  mometasone (NASONEX) 50 MCG/ACT nasal spray, USE 2 SPRAYS IN EACH NOSTRIL AT BEDTIME AS NEEDED FOR ALLERGIES (Patient taking differently: Place 2 sprays into the nose daily. ), Disp: 17 g, Rfl: 1 .  montelukast (SINGULAIR) 10 MG tablet, TAKE 1 TABLET (10 MG TOTAL) BY MOUTH AT BEDTIME. TAKE 1 TABLET(10 MG) BY MOUTH  AT BEDTIME, Disp: 90 tablet, Rfl: 3 .  omeprazole (PRILOSEC) 40 MG capsule, TAKE 1 CAPSULE (40 MG TOTAL) BY MOUTH 2 (TWO) TIMES DAILY BEFORE A MEAL., Disp: 180 capsule, Rfl: 0 .  ondansetron (ZOFRAN) 4 MG tablet, Take 4 mg by mouth every 8 (eight) hours as needed for nausea or vomiting. , Disp: , Rfl:  .  pregabalin (LYRICA) 100 MG capsule, Take 1 capsule (100 mg total) by mouth 3 (three) times daily. (Patient taking differently: Take 100 mg by mouth 2 (two) times daily. ), Disp: 270 capsule, Rfl: 0 .  promethazine (PHENERGAN) 12.5 MG tablet, TAKE 1 TABLET BY MOUTH EVERY 6 HOURS AS NEEDED FOR MIGRAINES WITH NAUSEA (Patient taking differently: Take 12.5 mg by mouth every 6 (six) hours as needed for nausea or vomiting (MIGRAINES WITH NAUSEA). ), Disp: 20 tablet, Rfl: 0 .  Semaglutide,0.25 or 0.5MG/DOS, (OZEMPIC, 0.25 OR 0.5 MG/DOSE,) 2 MG/1.5ML SOPN, Inject 0.5 mg into the skin once a week. (Patient taking differently: Inject 0.5 mg into the skin every Wednesday. ), Disp: 2 pen, Rfl: 0 .  SYRINGE-NEEDLE, DISP, 3 ML (B-D 3CC LUER-LOK SYR 25GX1") 25G X 1" 3 ML MISC, 1 each by Does not apply route every 14 (fourteen) days., Disp: 100 each, Rfl: 0 .  traZODone (DESYREL) 100 MG tablet, Take 200 mg by mouth at bedtime. , Disp: , Rfl:  .  TRINTELLIX 20 MG TABS tablet, Take 20 mg by mouth daily. , Disp: , Rfl:  .  Vitamin D, Ergocalciferol, (DRISDOL) 1.25 MG (50000 UNIT) CAPS capsule, TAKE 1 CAPSULE (50,000 UNITS TOTAL) BY MOUTH EVERY 7  (SEVEN) DAYS. TAKE 1 CAPSULE EVERY 7 DAYS (Patient taking differently: Take 50,000 Units by mouth every Saturday. ), Disp: 12 capsule, Rfl: 1 .  zonisamide (ZONEGRAN) 50 MG capsule, Take 150 mg by mouth daily. , Disp: , Rfl: 2 .  metoprolol tartrate (LOPRESSOR) 100 MG tablet, Take 1 tablet (100 mg total) by mouth once for 1 dose. Take 2 hours prior to your CT scan., Disp: 1 tablet, Rfl: 0  Allergies  Allergen Reactions  . Aspartame And Phenylalanine Nausea Only and Other (See Comments)    GI upset   . Gabapentin     Bladder incontinence at higher dose  . Linzess [Linaclotide]     Worsening of diarrhea  . Other Hives and Other (See Comments)    Ricotta Cheese: flushed and hot  . Adhesive [Tape] Rash    Some clear tapes  . Cymbalta [Duloxetine Hcl] Palpitations    And photosensitivity  . Tapentadol Rash    Some clear tapes    I personally reviewed active problem list, medication list, allergies, family history, social history, health maintenance with the patient/caregiver today.   ROS  Ten systems reviewed and is negative except as mentioned in HPI   Objective  Vitals:   10/21/19 0813  Weight: 197 lb (89.4 kg)    Body mass index is 37.22 kg/m.  Physical Exam  Awake, alert and oriented   Recent Results (from the past 2160 hour(s))  Basic metabolic panel     Status: Abnormal   Collection Time: 08/19/19  1:23 PM  Result Value Ref Range   Sodium 138 135 - 145 mmol/L   Potassium 4.0 3.5 - 5.1 mmol/L   Chloride 105 98 - 111 mmol/L   CO2 27 22 - 32 mmol/L   Glucose, Bld 83 70 - 99 mg/dL    Comment: Glucose reference range applies only to samples taken  after fasting for at least 8 hours.   [name] 9 6 - 20 mg/dL   Creatinine, Ser 0.77 0.44 - 1.00 mg/dL   Calcium 8.5 (L) 8.9 - 10.3 mg/dL   GFR calc non Af Amer >60 >60 mL/min   GFR calc Af Amer >60 >60 mL/min   Anion gap 6 5 - 15    Comment: Performed at [facility], [address] [place],  [address]   I-STAT creatinine     Status: None   Collection Time: 08/20/19  9:02 AM  Result Value Ref Range   Creatinine, Ser 0.60 0.44 - 1.00 mg/dL     PHQ2/9: Depression screen [facility] 2/9 07/21/2019 07/15/2019 06/04/2019 06/02/2019 04/20/2019  Decreased Interest 0 0 0 0 0  Down, Depressed, Hopeless 0 0 0 0 0  PHQ - 2 Score 0 0 0 0 0  Altered sleeping 0 0 - 0 1  Tired, decreased energy 0 2 - 0 1  Change in appetite 0 0 - 0 0  Feeling bad or failure about yourself  0 0 - 0 0  Trouble concentrating 0 2 - 0 1  Moving slowly or fidgety/restless 0 0 - 0 0  Suicidal thoughts 0 0 - 0 0  PHQ-9 Score 0 4 - 0 3  Difficult doing work/chores Not difficult at all Somewhat difficult - Not difficult at all Somewhat difficult  Some recent data might be hidden    phq 9 is negative   Fall Risk: Fall Risk  08/07/2019 07/21/2019 07/15/2019 06/18/2019 06/04/2019  Falls in the past year? 0 0 0 0 0  Number falls in past yr: - - 0 - -  Injury with Fall? - - 0 - -  Follow up - - Falls evaluation completed - -      Assessment & Plan  1. Diet-controlled diabetes mellitus ([place])  - Microalbumin / creatinine urine ratio - Hemoglobin A1c  2. B12 deficiency  Getting B12 injections   3. Atherosclerosis of aorta ([facility])  - Lipid panel  4. GERD without esophagitis  Not controlled with medication but getting ready to have bariatric revision and hiatal hernia repair done   5. Chronic insomnia   6. Mild persistent asthma without complication   7. Migraine with aura and without status migrainosus, not intractable  Under the care of Dr. Domingo Cocking   8. History of bariatric surgery  Stable   9. Thyroiditis  Under the care of Dr. Honor Junes  10. Vitamin D deficiency  Continue supplements   11. Fibromyalgia   12. Long-term use of high-risk medication  - Comprehensive metabolic panel - CBC  13. Need for hepatitis C screening test  - Hepatitis C antibody  I discussed the assessment and treatment plan  with the patient. The patient was provided an opportunity to ask questions and all were answered. The patient agreed with the plan and demonstrated an understanding of the instructions.   The patient was advised to call back or seek an in-person evaluation if the symptoms worsen or if the condition fails to improve as anticipated.  I provided 25 minutes of non-face-to-face time during this encounter.  Loistine Chance, MD

## 2019-10-21 ENCOUNTER — Telehealth (INDEPENDENT_AMBULATORY_CARE_PROVIDER_SITE_OTHER): Payer: No Typology Code available for payment source | Admitting: Family Medicine

## 2019-10-21 ENCOUNTER — Encounter: Payer: Self-pay | Admitting: Family Medicine

## 2019-10-21 ENCOUNTER — Ambulatory Visit: Payer: No Typology Code available for payment source | Admitting: Family Medicine

## 2019-10-21 VITALS — Wt 197.0 lb

## 2019-10-21 DIAGNOSIS — F5104 Psychophysiologic insomnia: Secondary | ICD-10-CM

## 2019-10-21 DIAGNOSIS — M797 Fibromyalgia: Secondary | ICD-10-CM

## 2019-10-21 DIAGNOSIS — I700 Atherosclerosis of aorta: Secondary | ICD-10-CM

## 2019-10-21 DIAGNOSIS — E559 Vitamin D deficiency, unspecified: Secondary | ICD-10-CM

## 2019-10-21 DIAGNOSIS — K219 Gastro-esophageal reflux disease without esophagitis: Secondary | ICD-10-CM

## 2019-10-21 DIAGNOSIS — J4530 Mild persistent asthma, uncomplicated: Secondary | ICD-10-CM

## 2019-10-21 DIAGNOSIS — E069 Thyroiditis, unspecified: Secondary | ICD-10-CM

## 2019-10-21 DIAGNOSIS — Z1159 Encounter for screening for other viral diseases: Secondary | ICD-10-CM

## 2019-10-21 DIAGNOSIS — Z79899 Other long term (current) drug therapy: Secondary | ICD-10-CM

## 2019-10-21 DIAGNOSIS — E119 Type 2 diabetes mellitus without complications: Secondary | ICD-10-CM | POA: Diagnosis not present

## 2019-10-21 DIAGNOSIS — E538 Deficiency of other specified B group vitamins: Secondary | ICD-10-CM | POA: Diagnosis not present

## 2019-10-21 DIAGNOSIS — Z9884 Bariatric surgery status: Secondary | ICD-10-CM

## 2019-10-21 DIAGNOSIS — G43109 Migraine with aura, not intractable, without status migrainosus: Secondary | ICD-10-CM

## 2019-10-28 ENCOUNTER — Other Ambulatory Visit: Payer: Self-pay

## 2019-10-28 ENCOUNTER — Encounter (HOSPITAL_COMMUNITY): Payer: Self-pay

## 2019-10-28 ENCOUNTER — Encounter (HOSPITAL_COMMUNITY)
Admission: RE | Admit: 2019-10-28 | Discharge: 2019-10-28 | Disposition: A | Discharge status: Home / Self Care | Payer: No Typology Code available for payment source | Source: Ambulatory Visit | Attending: Surgery | Admitting: Surgery

## 2019-10-28 DIAGNOSIS — Z01812 Encounter for preprocedural laboratory examination: Secondary | ICD-10-CM | POA: Insufficient documentation

## 2019-10-28 LAB — CBC
HCT: 42 % (ref 36.0–46.0)
Hemoglobin: 13.8 g/dL (ref 12.0–15.0)
MCH: 29.1 pg (ref 26.0–34.0)
MCHC: 32.9 g/dL (ref 30.0–36.0)
MCV: 88.4 fL (ref 80.0–100.0)
Platelets: 285 10*3/uL (ref 150–400)
RBC: 4.75 MIL/uL (ref 3.87–5.11)
RDW: 13.5 % (ref 11.5–15.5)
WBC: 8.3 10*3/uL (ref 4.0–10.5)
nRBC: 0 % (ref 0.0–0.2)

## 2019-10-28 LAB — BASIC METABOLIC PANEL
Anion gap: 7 (ref 5–15)
BUN: 12 mg/dL (ref 6–20)
CO2: 24 mmol/L (ref 22–32)
Calcium: 8.8 mg/dL — ABNORMAL LOW (ref 8.9–10.3)
Chloride: 106 mmol/L (ref 98–111)
Creatinine, Ser: 0.67 mg/dL (ref 0.44–1.00)
GFR calc Af Amer: >60 mL/min (ref >60)
GFR calc non Af Amer: >60 mL/min (ref >60)
Glucose, Bld: 106 mg/dL — ABNORMAL HIGH (ref 70–99)
Potassium: 3.7 mmol/L (ref 3.5–5.1)
Sodium: 137 mmol/L (ref 135–145)

## 2019-10-28 LAB — HEMOGLOBIN A1C
Hgb A1c MFr Bld: 5.5 % (ref 4.8–5.6)
Mean Plasma Glucose: 111.15 mg/dL

## 2019-10-28 NOTE — Progress Notes (Signed)
COVID Vaccine Completed:Yes Date COVID Vaccine completed:10/13/19 COVID vaccine manufacturer: *Pfizer    PCP - Dr. Steele Sizer. LOV: 06/02/19. Cardiologist - Dr. Kate Sable. LOV: 08/06/19  Chest x-ray -  EKG - 09/11/19 EPIC Stress Test -  ECHO - 08/05/19 Cardiac Cath -   Sleep Study -  CPAP -   Fasting Blood Sugar -  Checks Blood Sugar _____ times a day  Blood Thinner Instructions: Aspirin Instructions: It's been on hold for a month. Last Dose:  Anesthesia review: Hx: HTN,DIA,SOB,arrythmias.  Patient denies shortness of breath, fever, cough and chest pain at PAT appointment   Patient verbalized understanding of instructions that were given to them at the PAT appointment. Patient was also instructed that they will need to review over the PAT instructions again at home before surgery.

## 2019-10-29 NOTE — H&P (Signed)
  Claire [name] Location: [place] [facility] Patient #: [id number] DOB: 1970/06/25 Married / Language: English / Race: White Female   History of Present Illness  The patient is a 49 year old female who presents for a bariatric surgery evaluation. she had a sleeve gastrectomy done by Dr.Jin S. Shawna Orleans at [facility] in 2012. She had episodes of passing out and I saw her for these. She has abdominal pain and I did a cecopexy and the thought maybe she was having a cecal bascule. She's had severe problems with reflux with nocturnal refux despite PPI and is referred over by her primary care doctor has been approved for conversion of her sleeve gastrectomy to a Roux-en-Y gastric bypass. She has diet-controlled diabetes mellitus. She is scheduled for September 14 for conversion from her sleeve gastrectomy to Roux-en-Y gastric bypass. She indicated she had COVID in May was hospitalized at [facility] on oxygen for 3 weeks. l.   Allergies  Gabapentin *ANTICONVULSANTS*  Linzess *GASTROINTESTINAL AGENTS - MISC.*  Cymbalta *ANTIDEPRESSANTS*  Vicodin *ANALGESICS - OPIOID*  Adhesive Tape   Medication History (Chemira Jones, CMA; 10/14/2019 4:20 PM) Omeprazole (40MG  Capsule DR, Oral) Active. Dicyclomine HCl (20MG  Tablet, Oral) Active. Albuterol Sulfate (108 (90 Base)MCG/ACT Aero Pow Br Act, Inhalation) Active. ALPRAZolam (0.5MG  Tablet, Oral) Active. Ascorbic Acid (1000MG  Tablet, Oral) Active. Aspirin (81MG  Tablet, Oral) Active. Atorvastatin Calcium (10MG  Tablet, Oral) Active. Calcium Carbonate (600MG  Tablet, Oral) Active. Clobetasol Propionate (0.05% Ointment, External) Active. Cyclobenzaprine HCl (10MG  Tablet, Oral) Active. Ferrous Fumarate (325 (106 Fe)MG Tablet, Oral) Active. Fluconazole (150MG  Tablet, Oral) Active. Gentamicin Sulfate (0.1% Ointment, External) Active. Lansoprazole (30MG  Capsule DR, Oral) Active. Loratadine (10MG  Tablet, Oral) Active. Meclizine HCl  (25MG  Tablet, Oral) Active. Mometasone Furoate (0.1% Lotion, External) Active. Montelukast Sodium (10MG  Tablet, Oral) Active. Potassium (99MG  Tablet, Oral) Active. Semaglutide (0.25 or 0.5MG /DOSE Soln Pen-inj, Subcutaneous) Active. Sertraline HCl (100MG  Tablet, Oral) Active. SUMAtriptan Succinate (100MG  Tablet, Oral) Active. traMADol HCl (50MG  Tablet, Oral) Active. traZODone HCl (100MG  Tablet, Oral) Active. Vitamin B12 (100MCG Tablet, Oral) Active. Vitamin D (50000U Capsule, Oral) Active. Zolpidem Tartrate (10MG  Tablet, Oral) Active. Zonisamide (50MG  Capsule, Oral) Active. Medications Reconciled  Vitals (Chemira Jones CMA; 10/14/2019 4:20 PM) 10/14/2019 4:19 PM Weight: 194.4 lb Height: 61in Body Surface Area: 1.87 m Body Mass Index: 36.73 kg/m  Pulse: 102 (Regular)  BP: 130/84(Sitting, Left Arm, Standard)       Physical Exam (Sherida Dobkins B. Hassell Done MD; 10/14/2019 5:29 PM) General Note: today's weight is 194. BMI is 36.7  Slightly overweight WF HEENT unremarkable Neck supple Chest clear Heart SR Abdomen with well healed incisions Ext FROM with no hx of DVT    Assessment & Plan Rodman Key B. Hassell Done MD; 10/14/2019 5:29 PM) HISTORY OF SLEEVE GASTRECTOMY (Z90.3) Impression: she has been approved to convert from a sleeve gastrectomy to a Roux-en-Y gastric bypass. This has been scheduled for September 14. Questions answered.    Matt B. Hassell Done, MD, FACS

## 2019-10-29 NOTE — Progress Notes (Signed)
Anesthesia Chart Review   Case: [id number] Date/Time: 11/03/19 0715   Procedures:      LAPAROSCOPIC 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 WITH UPPER ENDOSCOPY (N/A )     HERNIA REPAIR HIATAL (N/A )   Anesthesia type: General   Pre-op diagnosis: MORBID OBESITY   Location: [facility] [facility] / [facility]   Surgeons: Johnathan Hausen, MD      DISCUSSION:49 y.o. former smoker (10 pack years, quit 02/19/93) with h/o GERD, asthma, s/p gastric sleeve 2012, COVID + 06/24/2019, morbid obesity scheduled for above procedure 11/03/2019 with Dr. Johnathan Hausen.   Pt last seen by cardiology 09/11/2019.  Per OV note, "Patient with dyspnea on exertion, priorechocardiogram with normal systolic and diastolic function.  She has risk factors of CAD including hyperlipidemia, strong family history with with sister passing from a massive heart attack, MI in the mother, MI and death.  Coronary CTA showed a calcium score of 175, minimal stenosis in the left circumflex 0 to 24%, mild calcified plaque in the proximal LAD causing mild stenosis 25%.  Her symptoms likely secondary to post viral sequelae secondary to COVID-19 especially as there is improvement."  Seen by PCP 10/21/2019.  Per OV note pt states she is no longer using inhaler and denies shortness of breath, wheezing, or cough.  Denies sx at PAT visit.    Anticipate pt can proceed with planned procedure barring acute status change.   VS: BP 114/62   Pulse 75   Temp 36.9 C (Oral)   Resp 18   Ht 5\' 1"  (1.549 m)   Wt 87.2 kg   SpO2 99%   BMI 36.33 kg/m   PROVIDERS: Steele Sizer, MD is PCP   Kate Sable, MD is Cardiologist  LABS: Labs reviewed: Acceptable for surgery. (all labs ordered are listed, but only abnormal results are displayed)  Labs Reviewed  BASIC METABOLIC PANEL - Abnormal; Notable for the following components:      Result Value   Glucose, Bld 106 (*)    Calcium 8.8 (*)    All other components within normal limits   CBC  HEMOGLOBIN A1C     IMAGES:   EKG: 09/11/2019 Rate 73 bpm  NSR  CV: Echo 08/05/2019 IMPRESSIONS    1. Left ventricular ejection fraction, by estimation, is 60 to 65%. The  left ventricle has normal function. The left ventricle has no regional  wall motion abnormalities. Left ventricular diastolic parameters were  normal.  2. Right ventricular systolic function is normal. The right ventricular  size is normal.  3. The mitral valve is normal in structure. Trivial mitral valve  regurgitation. No evidence of mitral stenosis.  4. The aortic valve is normal in structure. Aortic valve regurgitation is  not visualized. No aortic stenosis is present.  5. The inferior vena cava is normal in size with greater than 50%  respiratory variability, suggesting right atrial pressure of 3 mmHg.  Exercise Tolerance Test 07/08/2014  There was no ST segment deviation noted during stress.  Normal treadmill stress test  Past Medical History:  Diagnosis Date  . Anemia   . Anxiety   . Arthritis    hands, hips  . Asthma   . Bilateral leg cramps   . Depression   . Diabetes mellitus without complication ([place])    history no longer a problem since weight loss surgery  . DJD (degenerative joint disease)   . GERD (gastroesophageal reflux disease)   . Headache    Migraines  .  IBS (irritable bowel syndrome)   . Kidney stone   . Low serum vitamin B12   . Lung nodules   . Migraine    down to approx 4x/mo since starting meds  . Muscle spasm   . Psoriasis    vaginal area  . Seasonal allergies   . Sleep apnea    history no longer a problem since weight loss surgery  . Vasovagal syncope    [place], Dr. Alveria Apley   . Vitamin D deficiency     Past Surgical History:  Procedure Laterality Date  . BLADDER SURGERY  2010  . BREAST BIOPSY Right 2014   stereotatic biopsy  . CHOLECYSTECTOMY  2010  . COLONOSCOPY  2014    Done at [facility]  . COLONOSCOPY WITH PROPOFOL N/A 01/28/2018    Procedure: COLONOSCOPY WITH PROPOFOL;  Surgeon: Virgel Manifold, MD;  Location: [place];  Service: Endoscopy;  Laterality: N/A;  . DILITATION & CURRETTAGE/HYSTROSCOPY WITH NOVASURE ABLATION N/A 06/16/2015   Procedure: DILATATION & CURETTAGE/HYSTEROSCOPY WITH NOVASURE ABLATION;  Surgeon: Brien Few, MD;  Location: [facility];  Service: Gynecology;  Laterality: N/A;  . ESOPHAGOGASTRODUODENOSCOPY (EGD) WITH PROPOFOL N/A 01/28/2018   Procedure: ESOPHAGOGASTRODUODENOSCOPY (EGD) WITH BIOPSIES;  Surgeon: Virgel Manifold, MD;  Location: [place];  Service: Endoscopy;  Laterality: N/A;  . [place] RESECTION  2012  . LAPAROSCOPY N/A 06/16/2015   Procedure: LAPAROSCOPY DIAGNOSTIC;  Surgeon: Brien Few, MD;  Location: [place] [facility];  Service: Gynecology;  Laterality: N/A;  . LAPAROSCOPY N/A 04/01/2018   Procedure: LAPAROSCOPY DIAGNOSTIC ERAS PATHWAY ENTEROLYSIS, CECOPEXY;  Surgeon: Johnathan Hausen, MD;  Location: [facility];  Service: General;  Laterality: N/A;  . LITHOTRIPSY  12/11/2017   laser lithotripsy  . LYSIS OF ADHESION N/A 06/16/2015   Procedure: LYSIS OF ADHESION;  Surgeon: Brien Few, MD;  Location: [place] [facility];  Service: Gynecology;  Laterality: N/A;  . PLANTAR FASCIA SURGERY Bilateral   . ROBOTIC ASSISTED SALPINGO OOPHERECTOMY Right 06/16/2015   Procedure: ROBOTIC ASSISTED SALPINGO OOPHORECTOMY, EXCISION OF RIGHT CUL DE [place] MASS;  Surgeon: Brien Few, MD;  Location: [facility];  Service: Gynecology;  Laterality: Right;  . TONSILLECTOMY    . UPPER GI ENDOSCOPY      MEDICATIONS: . Accu-Chek FastClix Lancets MISC  . ACCU-CHEK GUIDE test strip  . ADDERALL XR 30 MG 24 hr capsule  . albuterol (VENTOLIN HFA) 108 (90 Base) MCG/ACT inhaler  . Ascorbic Acid (VITAMIN C) 1000 MG tablet  . aspirin EC 81 MG tablet  . atorvastatin (LIPITOR) 20 MG tablet  . calcium carbonate (OS-CAL) 600 MG TABS tablet  . clobetasol ointment (TEMOVATE) 0.05 %  . cyanocobalamin  (,VITAMIN B-12,) 1000 MCG/ML injection  . cyclobenzaprine (FLEXERIL) 10 MG tablet  . dicyclomine (BENTYL) 10 MG capsule  . famotidine (PEPCID) 20 MG tablet  . fluconazole (DIFLUCAN) 150 MG tablet  . fluocinonide (LIDEX) 0.05 % external solution  . ketoconazole (NIZORAL) 2 % shampoo  . Lactobacillus (ACIDOPHILUS PO)  . loratadine (CLARITIN) 10 MG tablet  . metoprolol tartrate (LOPRESSOR) 100 MG tablet  . mometasone (ELOCON) 0.1 % lotion  . mometasone (NASONEX) 50 MCG/ACT nasal spray  . montelukast (SINGULAIR) 10 MG tablet  . omeprazole (PRILOSEC) 40 MG capsule  . ondansetron (ZOFRAN) 4 MG tablet  . pregabalin (LYRICA) 100 MG capsule  . promethazine (PHENERGAN) 12.5 MG tablet  . Semaglutide,0.25 or 0.5MG /DOS, (OZEMPIC, 0.25 OR 0.5 MG/DOSE,) 2 MG/1.5ML SOPN  . SYRINGE-NEEDLE, DISP, 3 ML (B-D 3CC LUER-LOK SYR 25GX1") 25G  X 1" 3 ML MISC  . traZODone (DESYREL) 100 MG tablet  . TRINTELLIX 20 MG TABS tablet  . Vitamin D, Ergocalciferol, (DRISDOL) 1.25 MG (50000 UNIT) CAPS capsule  . zonisamide (ZONEGRAN) 50 MG capsule   No current facility-administered medications for this encounter.    Konrad Felix, PA-C [facility] [phone]

## 2019-10-29 NOTE — Anesthesia Preprocedure Evaluation (Addendum)
Anesthesia Evaluation  Patient identified by MRN, date of birth, ID band Patient awake    Reviewed: Allergy & Precautions, NPO status , Patient's Chart, lab work & pertinent test results  Airway Mallampati: II  TM Distance: >3 FB Neck ROM: Full    Dental no notable dental hx. (+) Teeth Intact, Dental Advisory Given   Pulmonary asthma , sleep apnea , former smoker,  COVID + 06/24/2019   Pulmonary exam normal breath sounds clear to auscultation       Cardiovascular negative cardio ROS Normal cardiovascular exam Rhythm:Regular Rate:Normal  EKG: 09/11/2019 Rate 73 bpm  NSR  Echo 08/05/2019 IMPRESSIONS  1. Left ventricular ejection fraction, by estimation, is 60 to 65%. The left ventricle has normal function. The left ventricle has no regional wall motion abnormalities. Left ventricular diastolic parameters were normal.  2. Right ventricular systolic function is normal. The right ventricular size is normal.  3. The mitral valve is normal in structure. Trivial mitral valve regurgitation. No evidence of mitral stenosis.  4. The aortic valve is normal in structure. Aortic valve regurgitation is not visualized. No aortic stenosis is present.  5. The inferior vena cava is normal in size with greater than 50% respiratory variability, suggesting right atrial pressure of 3 mmHg.  Exercise Tolerance Test 07/08/2014 There was no ST segment deviation noted during stress. Normal treadmill stress test    Neuro/Psych  Headaches, PSYCHIATRIC DISORDERS Anxiety Depression    GI/Hepatic Neg liver ROS, GERD  Medicated,  Endo/Other  negative endocrine ROSdiabetesObese BMI 36  Renal/GU negative Renal ROS  negative genitourinary   Musculoskeletal  (+) Arthritis ,   Abdominal   Peds  Hematology negative hematology ROS (+)   Anesthesia Other Findings s/p gastric sleeve 2012  Reproductive/Obstetrics                           Anesthesia Physical Anesthesia Plan  ASA: III  Anesthesia Plan: General   Post-op Pain Management:    Induction: Intravenous  PONV Risk Score and Plan: 3 and Midazolam, Dexamethasone and Ondansetron  Airway Management Planned: Oral ETT  Additional Equipment:   Intra-op Plan:   Post-operative Plan: Extubation in OR  Informed Consent: I have reviewed the patients History and Physical, chart, labs and discussed the procedure including the risks, benefits and alternatives for the proposed anesthesia with the patient or authorized representative who has indicated his/her understanding and acceptance.     Dental advisory given  Plan Discussed with: CRNA  Anesthesia Plan Comments:        Anesthesia Quick Evaluation

## 2019-10-30 ENCOUNTER — Other Ambulatory Visit (HOSPITAL_COMMUNITY)
Admission: RE | Admit: 2019-10-30 | Discharge: 2019-10-30 | Disposition: A | Discharge status: Home / Self Care | Payer: No Typology Code available for payment source | Source: Ambulatory Visit | Attending: Surgery | Admitting: Surgery

## 2019-10-30 DIAGNOSIS — Z01812 Encounter for preprocedural laboratory examination: Secondary | ICD-10-CM | POA: Diagnosis not present

## 2019-10-30 DIAGNOSIS — Z20822 Contact with and (suspected) exposure to covid-19: Secondary | ICD-10-CM | POA: Insufficient documentation

## 2019-10-30 LAB — SARS CORONAVIRUS 2 (TAT 6-24 HRS): SARS Coronavirus 2: NEGATIVE

## 2019-11-02 ENCOUNTER — Other Ambulatory Visit: Payer: Self-pay | Admitting: Surgery

## 2019-11-02 ENCOUNTER — Ambulatory Visit
Admission: RE | Admit: 2019-11-02 | Discharge: 2019-11-02 | Disposition: A | Discharge status: Home / Self Care | Payer: No Typology Code available for payment source | Source: Ambulatory Visit | Attending: Surgery | Admitting: Surgery

## 2019-11-02 DIAGNOSIS — K2100 Gastro-esophageal reflux disease with esophagitis, without bleeding: Secondary | ICD-10-CM

## 2019-11-02 MED ORDER — BUPIVACAINE LIPOSOME 1.3 % IJ SUSP
20.0000 mL | Freq: Once | INTRAMUSCULAR | Status: DC
  Filled 2019-11-02: qty 20

## 2019-11-03 ENCOUNTER — Inpatient Hospital Stay (HOSPITAL_COMMUNITY): Payer: No Typology Code available for payment source | Admitting: Physician Assistant

## 2019-11-03 ENCOUNTER — Encounter (HOSPITAL_COMMUNITY)
Admission: RE | Disposition: A | Discharge status: Home / Self Care | Payer: Self-pay | Source: Home / Self Care | Attending: Surgery

## 2019-11-03 ENCOUNTER — Inpatient Hospital Stay (HOSPITAL_COMMUNITY): Payer: No Typology Code available for payment source | Admitting: Certified Registered Nurse Anesthetist

## 2019-11-03 ENCOUNTER — Encounter (HOSPITAL_COMMUNITY): Payer: Self-pay | Admitting: Surgery

## 2019-11-03 ENCOUNTER — Inpatient Hospital Stay (HOSPITAL_COMMUNITY)
Admission: RE | Admit: 2019-11-03 | Discharge: 2019-11-04 | DRG: 328 | Disposition: A | Discharge status: Home / Self Care | Payer: No Typology Code available for payment source | Attending: Surgery | Admitting: Surgery

## 2019-11-03 ENCOUNTER — Other Ambulatory Visit: Payer: Self-pay

## 2019-11-03 DIAGNOSIS — Z885 Allergy status to narcotic agent status: Secondary | ICD-10-CM | POA: Diagnosis not present

## 2019-11-03 DIAGNOSIS — Z91048 Other nonmedicinal substance allergy status: Secondary | ICD-10-CM | POA: Diagnosis not present

## 2019-11-03 DIAGNOSIS — E119 Type 2 diabetes mellitus without complications: Secondary | ICD-10-CM | POA: Diagnosis present

## 2019-11-03 DIAGNOSIS — Z6836 Body mass index (BMI) 36.0-36.9, adult: Secondary | ICD-10-CM | POA: Diagnosis not present

## 2019-11-03 DIAGNOSIS — Z8616 Personal history of COVID-19: Secondary | ICD-10-CM | POA: Diagnosis not present

## 2019-11-03 DIAGNOSIS — K449 Diaphragmatic hernia without obstruction or gangrene: Secondary | ICD-10-CM | POA: Diagnosis present

## 2019-11-03 DIAGNOSIS — Z9884 Bariatric surgery status: Secondary | ICD-10-CM

## 2019-11-03 DIAGNOSIS — K219 Gastro-esophageal reflux disease without esophagitis: Secondary | ICD-10-CM | POA: Diagnosis present

## 2019-11-03 DIAGNOSIS — Z888 Allergy status to other drugs, medicaments and biological substances status: Secondary | ICD-10-CM

## 2019-11-03 DIAGNOSIS — R1084 Generalized abdominal pain: Secondary | ICD-10-CM | POA: Diagnosis present

## 2019-11-03 HISTORY — PX: GASTRIC ROUX-EN-Y: SHX5262

## 2019-11-03 HISTORY — PX: HIATAL HERNIA REPAIR: SHX195

## 2019-11-03 LAB — CBC
HCT: 41.2 % (ref 36.0–46.0)
Hemoglobin: 13 g/dL (ref 12.0–15.0)
MCH: 28.4 pg (ref 26.0–34.0)
MCHC: 31.6 g/dL (ref 30.0–36.0)
MCV: 90.2 fL (ref 80.0–100.0)
Platelets: 250 10*3/uL (ref 150–400)
RBC: 4.57 MIL/uL (ref 3.87–5.11)
RDW: 13.8 % (ref 11.5–15.5)
WBC: 12.1 10*3/uL — ABNORMAL HIGH (ref 4.0–10.5)
nRBC: 0 % (ref 0.0–0.2)

## 2019-11-03 LAB — COMPREHENSIVE METABOLIC PANEL
ALT: 23 U/L (ref 0–44)
AST: 28 U/L (ref 15–41)
Albumin: 4.1 g/dL (ref 3.5–5.0)
Alkaline Phosphatase: 67 U/L (ref 38–126)
Anion gap: 13 (ref 5–15)
BUN: 9 mg/dL (ref 6–20)
CO2: 23 mmol/L (ref 22–32)
Calcium: 9.3 mg/dL (ref 8.9–10.3)
Chloride: 104 mmol/L (ref 98–111)
Creatinine, Ser: 0.64 mg/dL (ref 0.44–1.00)
GFR calc Af Amer: >60 mL/min (ref >60)
GFR calc non Af Amer: >60 mL/min (ref >60)
Glucose, Bld: 109 mg/dL — ABNORMAL HIGH (ref 70–99)
Potassium: 3.9 mmol/L (ref 3.5–5.1)
Sodium: 140 mmol/L (ref 135–145)
Total Bilirubin: 0.7 mg/dL (ref 0.3–1.2)
Total Protein: 7.8 g/dL (ref 6.5–8.1)

## 2019-11-03 LAB — CREATININE, SERUM
Creatinine, Ser: 0.66 mg/dL (ref 0.44–1.00)
GFR calc Af Amer: >60 mL/min (ref >60)
GFR calc non Af Amer: >60 mL/min (ref >60)

## 2019-11-03 LAB — GLUCOSE, CAPILLARY: Glucose-Capillary: 118 mg/dL — ABNORMAL HIGH (ref 70–99)

## 2019-11-03 LAB — TYPE AND SCREEN
ABO/RH(D): A POS
Antibody Screen: NEGATIVE

## 2019-11-03 LAB — ABO/RH: ABO/RH(D): A POS

## 2019-11-03 LAB — HCG, SERUM, QUALITATIVE: Preg, Serum: NEGATIVE

## 2019-11-03 SURGERY — LAPAROSCOPIC ROUX-EN-Y GASTRIC BYPASS WITH UPPER ENDOSCOPY
Anesthesia: General

## 2019-11-03 MED ORDER — CHLORHEXIDINE GLUCONATE CLOTH 2 % EX PADS: 6.0000 | MEDICATED_PAD | Freq: Once | CUTANEOUS | Status: DC

## 2019-11-03 MED ORDER — PROPOFOL 10 MG/ML IV BOLUS
INTRAVENOUS | Status: DC | PRN
  Administered 2019-11-03: 150 mg via INTRAVENOUS

## 2019-11-03 MED ORDER — MIDAZOLAM HCL 2 MG/2ML IJ SOLN
INTRAMUSCULAR | Status: AC
  Filled 2019-11-03: qty 2

## 2019-11-03 MED ORDER — MORPHINE SULFATE (PF) 4 MG/ML IV SOLN
1.0000 mg | INTRAVENOUS | Status: DC | PRN
  Administered 2019-11-03 – 2019-11-04 (×3): 2 mg via INTRAVENOUS
  Filled 2019-11-03 (×3): qty 1

## 2019-11-03 MED ORDER — METOPROLOL TARTRATE 5 MG/5ML IV SOLN: 5.0000 mg | Freq: Four times a day (QID) | INTRAVENOUS | Status: DC | PRN

## 2019-11-03 MED ORDER — FENTANYL CITRATE (PF) 250 MCG/5ML IJ SOLN
INTRAMUSCULAR | Status: AC
  Filled 2019-11-03: qty 5

## 2019-11-03 MED ORDER — TISSEEL VH 10 ML EX KIT
PACK | CUTANEOUS | Status: AC
  Filled 2019-11-03: qty 1

## 2019-11-03 MED ORDER — BUPIVACAINE LIPOSOME 1.3 % IJ SUSP
INTRAMUSCULAR | Status: DC | PRN
  Administered 2019-11-03: 20 mL

## 2019-11-03 MED ORDER — PHENYLEPHRINE HCL (PRESSORS) 10 MG/ML IV SOLN
INTRAVENOUS | Status: AC
  Filled 2019-11-03: qty 1

## 2019-11-03 MED ORDER — LIDOCAINE 2% (20 MG/ML) 5 ML SYRINGE
INTRAMUSCULAR | Status: DC | PRN
  Administered 2019-11-03 (×2): .839 mg/kg/h via INTRAVENOUS

## 2019-11-03 MED ORDER — FENTANYL CITRATE (PF) 250 MCG/5ML IJ SOLN
INTRAMUSCULAR | Status: DC | PRN
Start: 2019-11-03 — End: 2019-11-03
  Administered 2019-11-03: 50 ug via INTRAVENOUS
  Administered 2019-11-03 (×2): 100 ug via INTRAVENOUS

## 2019-11-03 MED ORDER — TISSEEL VH 10 ML EX KIT
PACK | CUTANEOUS | Status: DC | PRN
  Administered 2019-11-03: 10 mL

## 2019-11-03 MED ORDER — PHENYLEPHRINE HCL-NACL 10-0.9 MG/250ML-% IV SOLN
INTRAVENOUS | Status: DC | PRN
  Administered 2019-11-03: 25 ug/min via INTRAVENOUS

## 2019-11-03 MED ORDER — LIDOCAINE HCL 2 % IJ SOLN
INTRAMUSCULAR | Status: AC
  Filled 2019-11-03: qty 20

## 2019-11-03 MED ORDER — ROCURONIUM BROMIDE 10 MG/ML (PF) SYRINGE
PREFILLED_SYRINGE | INTRAVENOUS | Status: DC | PRN
  Administered 2019-11-03: 90 mg via INTRAVENOUS
  Administered 2019-11-03 (×2): 10 mg via INTRAVENOUS

## 2019-11-03 MED ORDER — ORAL CARE MOUTH RINSE: 15.0000 mL | Freq: Once | OROMUCOSAL | Status: AC

## 2019-11-03 MED ORDER — KCL IN DEXTROSE-NACL 20-5-0.45 MEQ/L-%-% IV SOLN
INTRAVENOUS | Status: DC
  Filled 2019-11-03 (×3): qty 1000

## 2019-11-03 MED ORDER — EPHEDRINE SULFATE-NACL 50-0.9 MG/10ML-% IV SOSY
PREFILLED_SYRINGE | INTRAVENOUS | Status: DC | PRN
  Administered 2019-11-03 (×3): 5 mg via INTRAVENOUS
  Administered 2019-11-03: 10 mg via INTRAVENOUS
  Administered 2019-11-03 (×3): 5 mg via INTRAVENOUS

## 2019-11-03 MED ORDER — LIDOCAINE HCL (CARDIAC) PF 100 MG/5ML IV SOSY
PREFILLED_SYRINGE | INTRAVENOUS | Status: DC | PRN
  Administered 2019-11-03: 80 mg via INTRAVENOUS

## 2019-11-03 MED ORDER — OXYCODONE HCL 5 MG/5ML PO SOLN
5.0000 mg | Freq: Four times a day (QID) | ORAL | Status: DC | PRN
  Administered 2019-11-03 – 2019-11-04 (×3): 5 mg via ORAL
  Filled 2019-11-03 (×3): qty 5

## 2019-11-03 MED ORDER — LIDOCAINE 2% (20 MG/ML) 5 ML SYRINGE
INTRAMUSCULAR | Status: AC
  Filled 2019-11-03: qty 5

## 2019-11-03 MED ORDER — APREPITANT 40 MG PO CAPS
40.0000 mg | ORAL_CAPSULE | ORAL | Status: AC
  Administered 2019-11-03: 40 mg via ORAL
  Filled 2019-11-03: qty 1

## 2019-11-03 MED ORDER — PHENYLEPHRINE 40 MCG/ML (10ML) SYRINGE FOR IV PUSH (FOR BLOOD PRESSURE SUPPORT)
PREFILLED_SYRINGE | INTRAVENOUS | Status: DC | PRN
  Administered 2019-11-03 (×9): 80 ug via INTRAVENOUS

## 2019-11-03 MED ORDER — ROCURONIUM BROMIDE 10 MG/ML (PF) SYRINGE
PREFILLED_SYRINGE | INTRAVENOUS | Status: AC
  Filled 2019-11-03: qty 10

## 2019-11-03 MED ORDER — SUGAMMADEX SODIUM 200 MG/2ML IV SOLN
INTRAVENOUS | Status: DC | PRN
  Administered 2019-11-03: 200 mg via INTRAVENOUS

## 2019-11-03 MED ORDER — DEXAMETHASONE SODIUM PHOSPHATE 10 MG/ML IJ SOLN
INTRAMUSCULAR | Status: AC
  Filled 2019-11-03: qty 1

## 2019-11-03 MED ORDER — DEXAMETHASONE SODIUM PHOSPHATE 10 MG/ML IJ SOLN
INTRAMUSCULAR | Status: DC | PRN
  Administered 2019-11-03: 5 mg via INTRAVENOUS

## 2019-11-03 MED ORDER — SODIUM CHLORIDE (PF) 0.9 % IJ SOLN
INTRAMUSCULAR | Status: DC | PRN
  Administered 2019-11-03: 10 mL

## 2019-11-03 MED ORDER — LACTATED RINGERS IV SOLN: INTRAVENOUS | Status: DC

## 2019-11-03 MED ORDER — HEPARIN SODIUM (PORCINE) 5000 UNIT/ML IJ SOLN
5000.0000 [IU] | INTRAMUSCULAR | Status: AC
  Administered 2019-11-03: 5000 [IU] via SUBCUTANEOUS
  Filled 2019-11-03: qty 1

## 2019-11-03 MED ORDER — SODIUM CHLORIDE (PF) 0.9 % IJ SOLN
INTRAMUSCULAR | Status: AC
  Filled 2019-11-03: qty 10

## 2019-11-03 MED ORDER — FENTANYL CITRATE (PF) 100 MCG/2ML IJ SOLN: 25.0000 ug | INTRAMUSCULAR | Status: DC | PRN

## 2019-11-03 MED ORDER — ACETAMINOPHEN 160 MG/5ML PO SOLN
1000.0000 mg | Freq: Three times a day (TID) | ORAL | Status: DC
  Administered 2019-11-03 – 2019-11-04 (×2): 1000 mg via ORAL
  Filled 2019-11-03 (×2): qty 40.6

## 2019-11-03 MED ORDER — LACTATED RINGERS IR SOLN
Status: DC | PRN
  Administered 2019-11-03: 1000 mL

## 2019-11-03 MED ORDER — ONDANSETRON HCL 4 MG/2ML IJ SOLN
INTRAMUSCULAR | Status: AC
  Filled 2019-11-03: qty 2

## 2019-11-03 MED ORDER — SCOPOLAMINE 1 MG/3DAYS TD PT72
1.0000 | MEDICATED_PATCH | TRANSDERMAL | Status: DC
  Administered 2019-11-03: 1.5 mg via TRANSDERMAL
  Filled 2019-11-03: qty 1

## 2019-11-03 MED ORDER — CHLORHEXIDINE GLUCONATE 0.12 % MT SOLN
15.0000 mL | Freq: Once | OROMUCOSAL | Status: AC
  Administered 2019-11-03: 15 mL via OROMUCOSAL

## 2019-11-03 MED ORDER — PROPOFOL 10 MG/ML IV BOLUS
INTRAVENOUS | Status: AC
  Filled 2019-11-03: qty 40

## 2019-11-03 MED ORDER — SODIUM CHLORIDE 0.9 % IV SOLN
2.0000 g | INTRAVENOUS | Status: AC
  Administered 2019-11-03: 2 g via INTRAVENOUS
  Filled 2019-11-03: qty 2

## 2019-11-03 MED ORDER — ONDANSETRON HCL 4 MG/2ML IJ SOLN
INTRAMUSCULAR | Status: DC | PRN
  Administered 2019-11-03: 4 mg via INTRAVENOUS

## 2019-11-03 MED ORDER — ONDANSETRON HCL 4 MG/2ML IJ SOLN: 4.0000 mg | INTRAMUSCULAR | Status: DC | PRN

## 2019-11-03 MED ORDER — EPHEDRINE 5 MG/ML INJ
INTRAVENOUS | Status: AC
  Filled 2019-11-03: qty 10

## 2019-11-03 MED ORDER — KETAMINE HCL 10 MG/ML IJ SOLN
INTRAMUSCULAR | Status: DC | PRN
  Administered 2019-11-03: 30 mg via INTRAVENOUS

## 2019-11-03 MED ORDER — KETAMINE HCL 10 MG/ML IJ SOLN
INTRAMUSCULAR | Status: AC
  Filled 2019-11-03: qty 1

## 2019-11-03 MED ORDER — ENSURE MAX PROTEIN PO LIQD
2.0000 [oz_av] | ORAL | Status: DC
  Administered 2019-11-04 (×3): 2 [oz_av] via ORAL
  Filled 2019-11-03 (×7): qty 330

## 2019-11-03 MED ORDER — ACETAMINOPHEN 500 MG PO TABS
1000.0000 mg | ORAL_TABLET | ORAL | Status: AC
  Administered 2019-11-03: 1000 mg via ORAL
  Filled 2019-11-03: qty 2

## 2019-11-03 MED ORDER — PHENYLEPHRINE 40 MCG/ML (10ML) SYRINGE FOR IV PUSH (FOR BLOOD PRESSURE SUPPORT)
PREFILLED_SYRINGE | INTRAVENOUS | Status: AC
  Filled 2019-11-03: qty 20

## 2019-11-03 MED ORDER — ACETAMINOPHEN 500 MG PO TABS
1000.0000 mg | ORAL_TABLET | Freq: Three times a day (TID) | ORAL | Status: DC
  Administered 2019-11-04: 1000 mg via ORAL
  Filled 2019-11-03: qty 2

## 2019-11-03 MED ORDER — HEPARIN SODIUM (PORCINE) 5000 UNIT/ML IJ SOLN
5000.0000 [IU] | Freq: Three times a day (TID) | INTRAMUSCULAR | Status: DC
  Administered 2019-11-03 – 2019-11-04 (×4): 5000 [IU] via SUBCUTANEOUS
  Filled 2019-11-03 (×4): qty 1

## 2019-11-03 MED ORDER — PANTOPRAZOLE SODIUM 40 MG IV SOLR
40.0000 mg | Freq: Every day | INTRAVENOUS | Status: DC
  Administered 2019-11-03: 40 mg via INTRAVENOUS
  Filled 2019-11-03: qty 40

## 2019-11-03 MED ORDER — MIDAZOLAM HCL 2 MG/2ML IJ SOLN
INTRAMUSCULAR | Status: DC | PRN
  Administered 2019-11-03: 2 mg via INTRAVENOUS

## 2019-11-03 SURGICAL SUPPLY — 68 items
APPLICATOR COTTON TIP 6 STRL (MISCELLANEOUS) ×1 IMPLANT
APPLICATOR COTTON TIP 6IN STRL (MISCELLANEOUS) ×3
APPLIER CLIP ROT 10 11.4 M/L (STAPLE)
APPLIER CLIP ROT 13.4 12 LRG (CLIP)
BLADE SURG 15 STRL LF DISP TIS (BLADE) ×1 IMPLANT
BLADE SURG 15 STRL SS (BLADE) ×3
CABLE HIGH FREQUENCY MONO STRZ (ELECTRODE) IMPLANT
CLIP APPLIE ROT 10 11.4 M/L (STAPLE) IMPLANT
CLIP APPLIE ROT 13.4 12 LRG (CLIP) IMPLANT
CLIP SUT LAPRA TY ABSORB (SUTURE) ×12 IMPLANT
COVER WAND RF STERILE (DRAPES) IMPLANT
DERMABOND ADVANCED (GAUZE/BANDAGES/DRESSINGS) ×2
DERMABOND ADVANCED .7 DNX12 (GAUZE/BANDAGES/DRESSINGS) ×1 IMPLANT
DEVICE SUT QUICK LOAD TK 5 (STAPLE) ×2 IMPLANT
DEVICE SUT TI-KNOT TK 5X26 (MISCELLANEOUS) ×2 IMPLANT
DEVICE SUTURE ENDOST 10MM (ENDOMECHANICALS) ×3 IMPLANT
DEVICE TI KNOT TK5 (MISCELLANEOUS) ×1
DISSECTOR BLUNT TIP ENDO 5MM (MISCELLANEOUS) ×3 IMPLANT
DRAIN PENROSE 0.25X18 (DRAIN) ×3 IMPLANT
GAUZE 4X4 16PLY RFD (DISPOSABLE) ×3 IMPLANT
GLOVE BIOGEL M 8.0 STRL (GLOVE) ×3 IMPLANT
GOWN STRL REUS W/TWL XL LVL3 (GOWN DISPOSABLE) ×6 IMPLANT
HANDLE STAPLE EGIA 4 XL (STAPLE) ×3 IMPLANT
KIT BASIN OR (CUSTOM PROCEDURE TRAY) ×3 IMPLANT
KIT GASTRIC LAVAGE 34FR ADT (SET/KITS/TRAYS/PACK) ×3 IMPLANT
KIT TURNOVER KIT A (KITS) ×3 IMPLANT
MARKER SKIN DUAL TIP RULER LAB (MISCELLANEOUS) ×3 IMPLANT
MAT PREVALON FULL STRYKER (MISCELLANEOUS) ×3 IMPLANT
NEEDLE SPNL 22GX3.5 QUINCKE BK (NEEDLE) ×3 IMPLANT
PACK CARDIOVASCULAR III (CUSTOM PROCEDURE TRAY) ×3 IMPLANT
PENCIL SMOKE EVACUATOR (MISCELLANEOUS) IMPLANT
QUICK LOAD TK 5 (STAPLE) ×1
RELOAD EGIA 45 MED/THCK PURPLE (STAPLE) ×3 IMPLANT
RELOAD EGIA 45 TAN VASC (STAPLE) IMPLANT
RELOAD EGIA 60 MED/THCK PURPLE (STAPLE) IMPLANT
RELOAD EGIA 60 TAN VASC (STAPLE) ×6 IMPLANT
RELOAD ENDO STITCH 2.0 (ENDOMECHANICALS) ×30
RELOAD TRI 45 ART MED THCK PUR (STAPLE) ×3 IMPLANT
RELOAD TRI 60 ART MED THCK PUR (STAPLE) ×3 IMPLANT
SCISSORS LAP 5X45 EPIX DISP (ENDOMECHANICALS) ×3 IMPLANT
SEALANT SURGICAL APPL DUAL CAN (MISCELLANEOUS) ×3 IMPLANT
SET IRRIG TUBING LAPAROSCOPIC (IRRIGATION / IRRIGATOR) ×3 IMPLANT
SET TUBE SMOKE EVAC HIGH FLOW (TUBING) ×3 IMPLANT
SHEARS HARMONIC ACE PLUS 45CM (MISCELLANEOUS) ×3 IMPLANT
SLEEVE ADV FIXATION 12X100MM (TROCAR) ×6 IMPLANT
SLEEVE ADV FIXATION 5X100MM (TROCAR) IMPLANT
SOL ANTI FOG 6CC (MISCELLANEOUS) ×1 IMPLANT
SOLUTION ANTI FOG 6CC (MISCELLANEOUS) ×2
STAPLER VISISTAT 35W (STAPLE) IMPLANT
SUT MNCRL AB 4-0 PS2 18 (SUTURE) ×9 IMPLANT
SUT RELOAD ENDO STITCH 2 48X1 (ENDOMECHANICALS) ×6
SUT RELOAD ENDO STITCH 2.0 (ENDOMECHANICALS) ×4
SUT SURGIDAC NAB ES-9 0 48 120 (SUTURE) ×3 IMPLANT
SUT VIC AB 2-0 SH 27 (SUTURE) ×3
SUT VIC AB 2-0 SH 27X BRD (SUTURE) ×1 IMPLANT
SUTURE RELOAD END STTCH 2 48X1 (ENDOMECHANICALS) ×6 IMPLANT
SUTURE RELOAD ENDO STITCH 2.0 (ENDOMECHANICALS) ×4 IMPLANT
SYR 10ML ECCENTRIC (SYRINGE) ×3 IMPLANT
SYR 20ML LL LF (SYRINGE) ×6 IMPLANT
TOWEL OR 17X26 10 PK STRL BLUE (TOWEL DISPOSABLE) ×3 IMPLANT
TOWEL OR NON WOVEN STRL DISP B (DISPOSABLE) ×3 IMPLANT
TRAY FOLEY MTR SLVR 14FR STAT (SET/KITS/TRAYS/PACK) ×3 IMPLANT
TROCAR ADV FIXATION 12X100MM (TROCAR) ×3 IMPLANT
TROCAR ADV FIXATION 5X100MM (TROCAR) ×3 IMPLANT
TROCAR BLADELESS OPT 5 100 (ENDOMECHANICALS) ×3 IMPLANT
TROCAR XCEL 12X100 BLDLESS (ENDOMECHANICALS) ×3 IMPLANT
TUBING CONNECTING 10 (TUBING) ×2 IMPLANT
TUBING CONNECTING 10' (TUBING) ×1

## 2019-11-03 NOTE — Op Note (Signed)
Preoperative diagnosis: conversion from sleeve to roux en y gastric bypass, hiatal hernia repair  Postoperative diagnosis: Same   Procedure: Upper endoscopy   Surgeon: Clovis Riley, M.D.  Anesthesia: Gen.   Description of procedure: The endoscope was placed in the mouth and oropharynx and under endoscopic vision it was advanced to the esophagogastric junction which was identified at 33cm from the teeth.  The pouch was tensely insufflated while the roux limb was gently clamped and the upper abdomen was flooded with irrigation to perform a leak test, which was negative. No bubbles were seen.  The staple line was hemostatic and the anastomosis is visibly patent, measuring approximately 40cm from the teeth. The lumen was decompressed and the scope was withdrawn without difficulty.    Clovis Riley, M.D. General, Bariatric, & Minimally Invasive Surgery [facility]

## 2019-11-03 NOTE — Transfer of Care (Signed)
Immediate Anesthesia Transfer of Care Note  Patient: Claire [name]  Procedure(s) Performed: LAPAROSCOPIC 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 WITH UPPER ENDOSCOPY (N/A ) HERNIA REPAIR HIATAL (N/A )  Patient Location: PACU  Anesthesia Type:General  Level of Consciousness: drowsy  Airway & Oxygen Therapy: Patient Spontanous Breathing and Patient connected to face mask oxygen  Post-op Assessment: Report given to RN and Post -op Vital signs reviewed and stable  Post vital signs: Reviewed and stable  Last Vitals:  Vitals Value Taken Time  BP 124/75 11/03/19 1136  Temp    Pulse 71 11/03/19 1138  Resp 15 11/03/19 1138  SpO2 100 % 11/03/19 1138  Vitals shown include unvalidated device data.  Last Pain:  Vitals:   11/03/19 0657  TempSrc:   PainSc: 0-No pain         Complications: No complications documented.

## 2019-11-03 NOTE — Progress Notes (Signed)
Discussed post op day goals with patient including ambulation, IS, diet progression, pain, and nausea control.  BSTOP education provided including BSTOP information guide, "Guide for Pain Management after your Bariatric Procedure".  Questions answered. 

## 2019-11-03 NOTE — Progress Notes (Signed)
PHARMACY CONSULT FOR:  Risk Assessment for Post-Discharge VTE Following Bariatric Surgery  Post-Discharge VTE Risk Assessment: This patient's probability of 30-day post-discharge VTE is increased due to the factors marked:   Female    Age >/=60 years    BMI >/=50 kg/m2    CHF    Dyspnea at Rest    Paraplegia  x  Non-gastric-band surgery  x  Operation Time >/=3 hr    Return to OR     Length of Stay >/= 3 d   Hx of VTE   Hypercoagulable condition   Significant venous stasis   Predicted probability of 30-day post-discharge VTE: 0.25%  Other patient-specific factors to consider:   Recommendation for Discharge: No pharmacologic prophylaxis post-discharge  Claire [name] is a 49 y.o. female who underwent Conversion Sleeve Gastrectomy to Roux-en-Y on 9/14   Case start: 0818 Case end: 1126  Allergies  Allergen Reactions  . Aspartame And Phenylalanine Nausea Only and Other (See Comments)    GI upset   . Gabapentin     Bladder incontinence at higher dose  . Linzess [Linaclotide]     Worsening of diarrhea  . Other Hives and Other (See Comments)    Ricotta Cheese: flushed and hot  . Adhesive [Tape] Rash    Some clear tapes  . Cymbalta [Duloxetine Hcl] Palpitations    And photosensitivity  . Tapentadol Rash    Some clear tapes   Patient Measurements: Height: 5\' 1"  (154.9 cm) Weight: 85.8 kg (189 lb 3.2 oz) IBW/kg (Calculated) : 47.8 Body mass index is 35.75 kg/m.  Recent Labs    11/03/19 0630  CREATININE 0.64  ALBUMIN 4.1  PROT 7.8  AST 28  ALT 23  ALKPHOS 67  BILITOT 0.7   Estimated Creatinine Clearance: 86.5 mL/min (by C-G formula based on SCr of 0.64 mg/dL).  Past Medical History:  Diagnosis Date  . Anemia   . Anxiety   . Arthritis    hands, hips  . Asthma   . Bilateral leg cramps   . Depression   . Diabetes mellitus without complication ([place])    history no longer a problem since weight loss surgery  . DJD (degenerative joint disease)   . GERD  (gastroesophageal reflux disease)   . Headache    Migraines  . IBS (irritable bowel syndrome)   . Kidney stone   . Low serum vitamin B12   . Lung nodules   . Migraine    down to approx 4x/mo since starting meds  . Muscle spasm   . Psoriasis    vaginal area  . Seasonal allergies   . Sleep apnea    history no longer a problem since weight loss surgery  . Vasovagal syncope    [place], Dr. Alveria Apley   . Vitamin D deficiency    Medications Prior to Admission  Medication Sig Dispense Refill Last Dose  . ADDERALL XR 30 MG 24 hr capsule Take 30 mg by mouth daily.    11/02/2019 at Unknown time  . albuterol (VENTOLIN HFA) 108 (90 Base) MCG/ACT inhaler TAKE 2 PUFFS EVERY 6 HOURS AS NEEDED FOR WHEEZING OR SHORTNESS OF BREATH. (VENTOLIN NOT COVERED) (Patient taking differently: Inhale 2 puffs into the lungs every 6 (six) hours as needed for wheezing or shortness of breath. ) 8.5 g 2 11/03/2019 at 0530  . atorvastatin (LIPITOR) 20 MG tablet Take 1 tablet (20 mg total) by mouth daily. 30 tablet 6 11/02/2019 at Unknown time  . calcium carbonate (OS-CAL)  600 MG TABS tablet Take 600 mg by mouth daily with breakfast.   11/02/2019 at Unknown time  . clobetasol ointment (TEMOVATE) 5.18 % Apply 1 application topically daily as needed (psoriasis).   0 Past Month at Unknown time  . cyanocobalamin (,VITAMIN B-12,) 1000 MCG/ML injection INJECT 1 ML (1,000 MCG TOTAL) INTO THE MUSCLE EVERY 14 (FOURTEEN) DAYS. (Patient taking differently: Inject 1,000 mcg into the muscle every 14 (fourteen) days. Wednesdays) 6 mL 0 Past Week at Unknown time  . cyclobenzaprine (FLEXERIL) 10 MG tablet Take 1 tablet (10 mg total) by mouth at bedtime. Prn (Patient taking differently: Take 10 mg by mouth at bedtime as needed (migraine). ) 90 tablet 1 Past Month at Unknown time  . dicyclomine (BENTYL) 10 MG capsule TAKE 1 CAPSULE (10 MG TOTAL) BY MOUTH 4 (FOUR) TIMES DAILY - BEFORE MEALS AND AT BEDTIME. 90 capsule 1 11/02/2019 at Unknown time  .  famotidine (PEPCID) 20 MG tablet Take 1 tablet (20 mg total) by mouth 2 (two) times daily. 180 tablet 0 11/03/2019 at 0445  . fluconazole (DIFLUCAN) 150 MG tablet Take 1 tablet (150 mg total) by mouth every other day. (Patient taking differently: Take 150 mg by mouth daily as needed (yeast infection.). ) 9 tablet 2 Past Month at Unknown time  . fluocinonide (LIDEX) 0.05 % external solution Apply 1 application topically 2 (two) times daily as needed (psoriasis).    Past Month at Unknown time  . ketoconazole (NIZORAL) 2 % shampoo Apply 1 application topically 2 (two) times daily as needed (psoriasis).    Past Month at Unknown time  . Lactobacillus (ACIDOPHILUS PO) Take 1 capsule by mouth daily. Probiotic plus Calcium   11/02/2019 at Unknown time  . loratadine (CLARITIN) 10 MG tablet Take 10 mg by mouth daily.   11/03/2019 at 0445  . 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   4 Past Week at Unknown time  . mometasone (NASONEX) 50 MCG/ACT nasal spray USE 2 SPRAYS IN EACH NOSTRIL AT BEDTIME AS NEEDED FOR ALLERGIES (Patient taking differently: Place 2 sprays into the nose daily. ) 17 g 1 Past Month at Unknown time  . montelukast (SINGULAIR) 10 MG tablet TAKE 1 TABLET (10 MG TOTAL) BY MOUTH AT BEDTIME. TAKE 1 TABLET(10 MG) BY MOUTH AT BEDTIME 90 tablet 3 11/02/2019 at Unknown time  . omeprazole (PRILOSEC) 40 MG capsule TAKE 1 CAPSULE (40 MG TOTAL) BY MOUTH 2 (TWO) TIMES DAILY BEFORE A MEAL. 180 capsule 0 11/03/2019 at 0445  . pregabalin (LYRICA) 100 MG capsule Take 1 capsule (100 mg total) by mouth 3 (three) times daily. (Patient taking differently: Take 100 mg by mouth 2 (two) times daily. ) 270 capsule 0 11/03/2019 at 0445  . Semaglutide,0.25 or 0.5MG /DOS, (OZEMPIC, 0.25 OR 0.5 MG/DOSE,) 2 MG/1.5ML SOPN Inject 0.5 mg into the skin once a week. (Patient taking differently: Inject 0.5 mg into the skin every Wednesday. ) 2 pen 0 10/28/2019  . traZODone  (DESYREL) 100 MG tablet Take 200 mg by mouth at bedtime.    11/02/2019 at Unknown time  . TRINTELLIX 20 MG TABS tablet Take 20 mg by mouth daily.    11/03/2019 at 0445  . Vitamin D, Ergocalciferol, (DRISDOL) 1.25 MG (50000 UNIT) CAPS capsule TAKE 1 CAPSULE (50,000 UNITS TOTAL) BY MOUTH EVERY 7 (SEVEN) DAYS. TAKE 1 CAPSULE EVERY 7 DAYS (Patient taking differently: Take 50,000 Units by mouth every Saturday. ) 12 capsule 1 10/31/2019  .  zonisamide (ZONEGRAN) 50 MG capsule Take 150 mg by mouth daily.   2 11/02/2019 at Unknown time  . Accu-Chek FastClix Lancets MISC USE TO TEST UP TO 4 TIMES A DAY   supply  . ACCU-CHEK GUIDE test strip    supply  . Ascorbic Acid (VITAMIN C) 1000 MG tablet Take 1,000 mg by mouth.   More than a month at Unknown time  . aspirin EC 81 MG tablet Take 1 tablet (81 mg total) by mouth daily. 30 tablet 0 More than a month at Unknown time  . metoprolol tartrate (LOPRESSOR) 100 MG tablet Take 1 tablet (100 mg total) by mouth once for 1 dose. Take 2 hours prior to your CT scan. 1 tablet 0   . ondansetron (ZOFRAN) 4 MG tablet Take 4 mg by mouth every 8 (eight) hours as needed for nausea or vomiting.    More than a month at Unknown time  . promethazine (PHENERGAN) 12.5 MG tablet TAKE 1 TABLET BY MOUTH EVERY 6 HOURS AS NEEDED FOR MIGRAINES WITH NAUSEA (Patient taking differently: Take 12.5 mg by mouth every 6 (six) hours as needed for nausea or vomiting (MIGRAINES WITH NAUSEA). ) 20 tablet 0 More than a month at Unknown time  . SYRINGE-NEEDLE, DISP, 3 ML (B-D 3CC LUER-LOK SYR 25GX1") 25G X 1" 3 ML MISC 1 each by Does not apply route every 14 (fourteen) days. 100 each 0 supply   Minda Ditto PharmD 11/03/2019,1:23 PM

## 2019-11-03 NOTE — Anesthesia Procedure Notes (Signed)
Procedure Name: Intubation Date/Time: 11/03/2019 7:36 AM Performed by: Raenette Rover, CRNA Pre-anesthesia Checklist: Patient identified, Emergency Drugs available, Suction available and Patient being monitored Patient Re-evaluated:Patient Re-evaluated prior to induction Oxygen Delivery Method: Circle system utilized Preoxygenation: Pre-oxygenation with 100% oxygen Induction Type: IV induction Ventilation: Mask ventilation without difficulty Laryngoscope Size: Mac and 3 Grade View: Grade II Tube type: Oral Tube size: 7.0 mm Number of attempts: 1 Airway Equipment and Method: Stylet Placement Confirmation: ETT inserted through vocal cords under direct vision,  positive ETCO2 and breath sounds checked- equal and bilateral Secured at: 19 cm Tube secured with: Tape Dental Injury: Teeth and Oropharynx as per pre-operative assessment

## 2019-11-03 NOTE — Anesthesia Postprocedure Evaluation (Signed)
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  Last Vitals:  Vitals:   11/03/19 1245 11/03/19 1259  BP: 122/69 113/71  Pulse: 67 60  Resp: (!) 22 18  Temp: 36.4 C 36.6 C  SpO2: 98% 100%    Last Pain:  Vitals:   11/03/19 1318  TempSrc:   PainSc: 9                  Zenita Kister L Albie Bazin

## 2019-11-03 NOTE — Interval H&P Note (Signed)
History and Physical Interval Note:  11/03/2019 7:20 AM  Claire [name]  has presented today for surgery, with the diagnosis of MORBID OBESITY.  The various methods of treatment have been discussed with the patient and family. 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  The patient's history has been reviewed, patient examined, no change in status, stable for surgery.  I have reviewed the patient's chart and labs.  Questions were answered to the patient's satisfaction.     Pedro Earls

## 2019-11-03 NOTE — Progress Notes (Signed)
Patient has ambulated, voided, is using her incentive spirometer, and her vitals are stable. She started drinking her first 2 oz cup of water at 1615.

## 2019-11-03 NOTE — Discharge Instructions (Signed)
GASTRIC BYPASS/SLEEVE  Home Care Instructions   These instructions are to help you care for yourself when you go home.  Call: If you have any problems. . Call [phone] and ask for the surgeon on call . If you need immediate help, come to the ER at [facility].  . Tell the ER staff that you are a new post-op gastric bypass or gastric sleeve patient   Signs and symptoms to report: . Severe vomiting or nausea o If you cannot keep down clear liquids for longer than 1 day, call your surgeon  . Abdominal pain that does not get better after taking your pain medication . Fever over 100.4 F with chills . Heart beating over 100 beats a minute . Shortness of breath at rest . Chest pain .  Redness, swelling, drainage, or foul odor at incision (surgical) sites .  If your incisions open or pull apart . Swelling or pain in calf (lower leg) . Diarrhea (Loose bowel movements that happen often), frequent watery, uncontrolled bowel movements . Constipation, (no bowel movements for 3 days) if this happens: Pick one o Milk of Magnesia, 2 tablespoons by mouth, 3 times a day for 2 days if needed o Stop taking Milk of Magnesia once you have a bowel movement o Call your doctor if constipation continues Or o Miralax  (instead of Milk of Magnesia) following the label instructions o Stop taking Miralax once you have a bowel movement o Call your doctor if constipation continues . Anything you think is not normal   Normal side effects after surgery: . Unable to sleep at night or unable to focus . Irritability or moody . Being tearful (crying) or depressed These are common complaints, possibly related to your anesthesia medications that put you to sleep, stress of surgery, and change in lifestyle.  This usually goes away a few weeks after surgery.  If these feelings continue, call your primary care doctor.   Wound Care:  . Surgical glue:  Looks like a clear film over your incisions and will wear  off a little at a time  . Showering: You may shower two (2) days after your surgery unless your surgeon tells you differently o Wash gently around incisions with warm soapy water, rinse well, and gently pat dry  o No tub baths until staples are removed, steri-strips fall off or glue is gone.    Medications: Marland Kitchen Medications should be liquid or crushed if larger than the size of a dime . Extended release pills (medication that release a little bit at a time through the day) should NOT be crushed or cut. (examples include XL, ER, DR, SR) . Depending on the size and number of medications you take, you may need to space (take a few throughout the day)/change the time you take your medications so that you do not over-fill your pouch (smaller stomach) . Make sure you follow-up with your primary care doctor to make medication changes needed during rapid weight loss and life-style changes . If you have diabetes, follow up with the doctor that orders your diabetes medication(s) within one week after surgery and check your blood sugar regularly. . Do not drive while taking prescription pain medication  . It is ok to take Tylenol by the bottle instructions with your pain medicine or instead of your pain medicine as needed.  DO NOT TAKE NSAIDS (EXAMPLES OF NSAIDS:  IBUPROFREN/ NAPROXEN)  Diet:  First 2 Weeks  You will see the dietician t about two (2) weeks after your surgery. The dietician will increase the types of foods you can eat if you are handling liquids well: Marland Kitchen If you have severe vomiting or nausea and cannot keep down clear liquids lasting longer than 1 day, call your surgeon @ [phone]) Protein Shake . Drink at least 2 ounces of shake 5-6 times per day . Each serving of protein shakes (usually 8 - 12 ounces) should have: o 15 grams of protein  o And no more than 5 grams of carbohydrate  . Goal for protein each day: o Men = 80 grams per day o Women = 60 grams per  day . Protein powder may be added to fluids such as non-fat milk or Lactaid milk or unsweetened Soy/Almond milk (limit to 35 grams added protein powder per serving)  Hydration . Slowly increase the amount of water and other clear liquids as tolerated (See Acceptable Fluids) . Slowly increase the amount of protein shake as tolerated  .  Sip fluids slowly and throughout the day.  Do not use straws. . May use sugar substitutes in small amounts (no more than 6 - 8 packets per day; i.e. Splenda)  Fluid Goal . The first goal is to drink at least 8 ounces of protein shake/drink per day (or as directed by the nutritionist); some examples of protein shakes are [employer], [employer], EAS Edge HP, and Unjury. See handout from pre-op Bariatric Education Class: o Slowly increase the amount of protein shake you drink as tolerated o You may find it easier to slowly sip shakes throughout the day o It is important to get your proteins in first . Your fluid goal is to drink 64 - 100 ounces of fluid daily o It may take a few weeks to build up to this . 32 oz (or more) should be clear liquids  And  . 32 oz (or more) should be full liquids (see below for examples) . Liquids should not contain sugar, caffeine, or carbonation  Clear Liquids: . Water or Sugar-free flavored water (i.e. Fruit H2O, Propel) . Decaffeinated coffee or tea (sugar-free) . [employer], [employer], Minute [facility] . Sugar-free Jell-O . Bouillon or broth . Sugar-free Popsicle:   *Less than 20 calories each; Limit 1 per day  Full Liquids: Protein Shakes/Drinks + 2 choices per day of other full liquids . Full liquids must be: o No More Than 15 grams of Carbs per serving  o No More Than 3 grams of Fat per serving . Strained low-fat cream soup (except Cream of Potato or Tomato) . Non-Fat milk . Fat-free Lactaid Milk . Unsweetened Soy Or Unsweetened Almond Milk . Low Sugar yogurt (Dannon Lite & Fit, [place] yogurt; Oikos  Triple Zero; Chobani Simply 100; Yoplait 100 calorie [place] - No Fruit on the Bottom)    Vitamins and Minerals . Start 1 day after surgery unless otherwise directed by your surgeon . Chewable Bariatric Specific Multivitamin / Multimineral Supplement with iron (Example: Bariatric Advantage Multi EA) . Chewable Calcium with Vitamin D-3 (Example: 3 Chewable Calcium Plus 600 with Vitamin D-3) o Take 500 mg three (3) times a day for a total of 1500 mg each day o Do not take all 3 doses of calcium at one time as it may cause constipation, and you can only absorb 500 mg  at a time  o Do not mix multivitamins containing iron with calcium supplements; take 2  hours apart . Menstruating women and those with a history of anemia (a blood disease that causes weakness) may need extra iron o Talk with your doctor to see if you need more iron . Do not stop taking or change any vitamins or minerals until you talk to your dietitian or surgeon . Your Dietitian and/or surgeon must approve all vitamin and mineral supplements   Activity and Exercise: Limit your physical activity as instructed by your doctor.  It is important to continue walking at home.  During this time, use these guidelines: . Do not lift anything greater than ten (10) pounds for at least two (2) weeks . Do not go back to work or drive until Engineer, production says you can . You may have sex when you feel comfortable  o It is VERY important for female patients to use a reliable birth control method; fertility often increases after surgery  o All hormonal birth control will be ineffective for 30 days after surgery due to medications given during surgery a barrier method must be used. o Do not get pregnant for at least 18 months . Start exercising as soon as your doctor tells you that you can o Make sure your doctor approves any physical activity . Start with a simple walking program . Walk 5-15 minutes each day, 7 days per week.  . Slowly increase  until you are walking 30-45 minutes per day Consider joining our [place] program. [phone] or email [email or web address]   Special Instructions Things to remember: . Use your CPAP when sleeping if this applies to you  . [facility] has two free Bariatric Surgery Support Groups that meet monthly o The 3rd Thursday of each month, 6 pm o The 2nd Friday of each month, 11:30 . It is very important to keep all follow up appointments with your surgeon, dietitian, primary care physician, and behavioral health practitioner . Routine follow up schedule with your surgeon include appointments at 2-3 weeks, 6-8 weeks, 6 months, and 1 year at a minimum.  Your surgeon may request to see you more often.  . After the first year, please follow up with your bariatric surgeon and dietitian at least once a year in order to maintain best weight loss results   [facility]: [place]: [phone] Bariatric Nurse Coordinator: [phone]      Reviewed and Endorsed  by [facility] Patient Education Committee, June, 2016 Edits Approved: Aug, 2018

## 2019-11-03 NOTE — Op Note (Addendum)
03 November 2019 Surgeon: Althea Grimmer. 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  Closure of Peterson's defect. Upper endoscopy.   Description of Procedure:  The patient was taken to OR 1 at [facility] and given general anesthesia.  The abdomen was prepped with PCMX and draped sterilely.  A time out was performed.  Access to the abdomen was achieved with a 10 mm Optiview through the left upper quadrant.  Standard trocar placements were made for a gastric bypass.  30 cc of Exparel was used to complete a tap block laterally.  The Nathanson retractor was inserted and the left lateral segment of liver was retracted.  The patient's prior sleeve gastrectomy was identified and dissection at the crura was performed.  The esophagus was brought to the abdomen and the posterior right and left crura were noted posteriorly.  There was a hernia in that location above what appeared to be a pledgeted closure.  The crura were further approximated with a single Surgidek secured with a tie knot.  The sleeve was further dissected and a site was selected down along the sleeve approximately 6 cm downstream.  It was divided with a 2 applications of a purple load Covidien with TRS buttress.     The operation continued by identifying the ligament of Treitz. I measured 40 cm downstream and divided the bowel with a 6 cm Covidian stapler.  I sutured a Penrose drain along the Roux limb end.  I measured a 1 meter (100 cm) Roux limb and then placed the distal bowels to the BP limb side by side and performed a stapled jejunojejunostomy. The common defect was closed from either end with 4-0 Vicryl using the Endo Stitch. The mesenteric defect was closed with a running 2-0 silk using the Endo  Stitch. Tisseel was applied to the suture line.  The omentum was divided with the harmonic scalpel.  The Roux limb was then brought up with the candycane pointed left and a back row of sutures of 2-0 Vicryl were placed. I opened along the right side of each structure and inserted the 4.5 cm stapler to create the gastrojejunostomy.  This was ~ 2 cm.   The common defect was closed from either end with 2-0 Vicryl tied and further secured with PDS securers and a second row was placed anterior to that the Ewald tube acting as a stent across the anastomosis. The Penrose drain was removed. Peterson's defect was closed with 2-0 silk.   Endoscopy was performed by Dr. Kae Heller.  No bubbles were seen and the anastomosis was patent.   The incisions were closed with 4-0 Monocryl and Dermabond.  The patient was taken to the recovery room in satisfactory condition.  Matt B. Hassell Done, MD, FACS

## 2019-11-04 ENCOUNTER — Encounter (HOSPITAL_COMMUNITY): Payer: Self-pay | Admitting: Surgery

## 2019-11-04 ENCOUNTER — Other Ambulatory Visit: Payer: Self-pay | Admitting: Family Medicine

## 2019-11-04 DIAGNOSIS — E559 Vitamin D deficiency, unspecified: Secondary | ICD-10-CM

## 2019-11-04 LAB — CBC WITH DIFFERENTIAL/PLATELET
Abs Immature Granulocytes: 0.07 10*3/uL (ref 0.00–0.07)
Basophils Absolute: 0 10*3/uL (ref 0.0–0.1)
Basophils Relative: 0 %
Eosinophils Absolute: 0 10*3/uL (ref 0.0–0.5)
Eosinophils Relative: 0 %
HCT: 37.4 % (ref 36.0–46.0)
Hemoglobin: 12 g/dL (ref 12.0–15.0)
Immature Granulocytes: 1 %
Lymphocytes Relative: 6 %
Lymphs Abs: 1 10*3/uL (ref 0.7–4.0)
MCH: 28.9 pg (ref 26.0–34.0)
MCHC: 32.1 g/dL (ref 30.0–36.0)
MCV: 90.1 fL (ref 80.0–100.0)
Monocytes Absolute: 1 10*3/uL (ref 0.1–1.0)
Monocytes Relative: 6 %
Neutro Abs: 13.4 10*3/uL — ABNORMAL HIGH (ref 1.7–7.7)
Neutrophils Relative %: 87 %
Platelets: 276 10*3/uL (ref 150–400)
RBC: 4.15 MIL/uL (ref 3.87–5.11)
RDW: 13.9 % (ref 11.5–15.5)
WBC: 15.5 10*3/uL — ABNORMAL HIGH (ref 4.0–10.5)
nRBC: 0 % (ref 0.0–0.2)

## 2019-11-04 LAB — BASIC METABOLIC PANEL
Anion gap: 7 (ref 5–15)
BUN: <5 mg/dL — ABNORMAL LOW (ref 6–20)
CO2: 24 mmol/L (ref 22–32)
Calcium: 8.3 mg/dL — ABNORMAL LOW (ref 8.9–10.3)
Chloride: 104 mmol/L (ref 98–111)
Creatinine, Ser: 0.62 mg/dL (ref 0.44–1.00)
GFR calc Af Amer: >60 mL/min (ref >60)
GFR calc non Af Amer: >60 mL/min (ref >60)
Glucose, Bld: 127 mg/dL — ABNORMAL HIGH (ref 70–99)
Potassium: 3.8 mmol/L (ref 3.5–5.1)
Sodium: 135 mmol/L (ref 135–145)

## 2019-11-04 LAB — MAGNESIUM: Magnesium: 1.8 mg/dL (ref 1.7–2.4)

## 2019-11-04 MED ORDER — PANTOPRAZOLE SODIUM 40 MG PO TBEC: 40.0000 mg | DELAYED_RELEASE_TABLET | Freq: Every day | ORAL | 0 refills | Status: DC

## 2019-11-04 MED ORDER — ONDANSETRON 4 MG PO TBDP: 4.0000 mg | ORAL_TABLET | Freq: Four times a day (QID) | ORAL | 0 refills | Status: DC | PRN

## 2019-11-04 MED ORDER — OXYCODONE HCL 5 MG PO TABS: 5.0000 mg | ORAL_TABLET | Freq: Four times a day (QID) | ORAL | 0 refills | Status: DC | PRN

## 2019-11-04 MED ORDER — ACETAMINOPHEN 500 MG PO TABS: 1000.0000 mg | ORAL_TABLET | Freq: Three times a day (TID) | ORAL | 0 refills | Status: AC

## 2019-11-04 MED FILL — PANTOPRAZOLE SOD DR 40 MG T: 40 | 30 days supply | Qty: 30 | Fill #0

## 2019-11-04 MED FILL — ONDANSETRON ODT 4 MG TABLET: 4 | 4 days supply | Qty: 18 | Fill #0

## 2019-11-04 MED FILL — oxyCODONE HCL 5 MG TABS: 5 | 2 days supply | Qty: 10 | Fill #0

## 2019-11-04 NOTE — Progress Notes (Signed)
Patient alert and oriented, pain is controlled. Patient is tolerating fluids, advanced to protein shake today, patient is tolerating well. Reviewed Gastric Bypass discharge instructions with patient and patient is able to articulate understanding. Provided information on BELT program, Support Group and [facility] outpatient pharmacy. All questions answered, will continue to monitor.   Total fluid intake 870 Per dehydration protocol call back one week post op

## 2019-11-04 NOTE — Progress Notes (Signed)
Nutrition Brief Note  RD consulted for diet education for patient s/p bariatric surgery. Bariatric nurse coordinator providing education.   If nutrition issues arise, please consult RD.   Claire Peralta, MS, RD, LDN Inpatient Clinical Dietitian Contact information available via Amion  

## 2019-11-04 NOTE — Telephone Encounter (Signed)
Requested medications are due for refill today?  Yes - 50,000 IU strengths cannot be delegated.    Requested medications are on active medication list?  Yes  Last Refill:  05/19/2019  # 12 with one refill   Future visit scheduled?  No  Notes to Clinic:   50,000 IU strengths cannot be delegated.

## 2019-11-04 NOTE — Progress Notes (Signed)
Patient alert and oriented, Post op day 1.  Provided support and encouragement.  Encouraged pulmonary toilet, ambulation and small sips of liquids.  All questions answered.  Will continue to monitor. 

## 2019-11-04 NOTE — Discharge Summary (Signed)
Physician Discharge Summary  Claire [name] VEH:[id number] DOB: 1970-09-12 DOA: 11/03/2019  PCP: Steele Sizer, MD  Admit date: 11/03/2019 Discharge date: 11/04/2019   Recommendations for Outpatient Follow-up:     Follow-up Information    Surgery, [place]. Go on 11/26/2019.   Specialty: General Surgery Why: at 10 am Contact information: [address] [name] [address] [phone]        [place]. Go on 12/30/2019.   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 Please see operative note. Preoperatively the patient was given 5000 units of subcutaneous heparin for DVT prophylaxis. ERAS protocol was used. Postoperative prophylactic heparin dosing was started on the evening of postoperative day 0.  The patient was started on ice chips and water on the evening of POD 0 which they tolerated. On postoperative day 1 The patient's diet was advanced to protein shakes which they also tolerated. Their vital signs are stable without fever or tachycardia. Their hemoglobin had remained stable.  The patient had received discharge instructions and counseling. They were deemed stable for discharge.  BP 138/66 (BP Location: Right Arm)   Pulse (!) 57   Temp 98.4 F (36.9 C) (Oral)   Resp 18   Ht 5\' 1"  (1.549 m)   Wt 85.8 kg   SpO2 97%   BMI 35.75 kg/m   See rounding exam  Discharge  Instructions   Allergies as of 11/04/2019      Reactions   Aspartame And Phenylalanine Nausea Only, Other (See Comments)   GI upset    Gabapentin    Bladder incontinence at higher dose   Linzess [linaclotide]    Worsening of diarrhea   Other Hives, Other (See Comments)   Ricotta Cheese: flushed and hot   Adhesive [tape] Rash   Some clear tapes   Cymbalta [duloxetine Hcl] Palpitations   And photosensitivity   Tapentadol Rash   Some clear tapes      Medication List    STOP taking these medications   aspirin EC 81 MG tablet   B-D 3CC LUER-LOK SYR 25GX1" 25G X 1" 3 ML Misc Generic drug: SYRINGE-NEEDLE (DISP) 3 ML   famotidine 20 MG tablet Commonly known as: PEPCID   metoprolol tartrate 100 MG tablet Commonly known as: LOPRESSOR   omeprazole 40 MG capsule Commonly known as: PRILOSEC   ondansetron 4 MG tablet Commonly known as: ZOFRAN   Ozempic (0.25 or 0.5 MG/DOSE) 2 MG/1.5ML Sopn Generic drug: Semaglutide(0.25 or 0.5MG /DOS)     TAKE these medications   Accu-Chek FastClix Lancets Misc USE TO TEST UP TO 4 TIMES A DAY   Accu-Chek Guide test strip Generic drug: glucose blood   acetaminophen 500 MG tablet Commonly known as: TYLENOL Take 2 tablets (1,000 mg total) by mouth every 8 (eight) hours for 5 days.   ACIDOPHILUS PO Take 1 capsule by mouth daily. Probiotic plus Calcium   Adderall  XR 30 MG 24 hr capsule Generic drug: amphetamine-dextroamphetamine Take 30 mg by mouth daily. Notes to patient: Capsule may be swallowed whole or it may be opened and the contents sprinkled on applesauce   albuterol 108 (90 Base) MCG/ACT inhaler Commonly known as: VENTOLIN HFA TAKE 2 PUFFS EVERY 6 HOURS AS NEEDED FOR WHEEZING OR SHORTNESS OF BREATH. (VENTOLIN NOT COVERED) What changed: See the new instructions.   atorvastatin 20 MG tablet Commonly known as: LIPITOR Take 1 tablet (20 mg total) by mouth daily.   calcium carbonate 600 MG Tabs tablet Commonly known as:  OS-CAL Take 600 mg by mouth daily with breakfast.   clobetasol ointment 0.05 % Commonly known as: TEMOVATE Apply 1 application topically daily as needed (psoriasis).   cyanocobalamin 1000 MCG/ML injection Commonly known as: (VITAMIN B-12) INJECT 1 ML (1,000 MCG TOTAL) INTO THE MUSCLE EVERY 14 (FOURTEEN) DAYS. What changed: additional instructions   cyclobenzaprine 10 MG tablet Commonly known as: FLEXERIL Take 1 tablet (10 mg total) by mouth at bedtime. Prn What changed:   when to take this  reasons to take this  additional instructions   dicyclomine 10 MG capsule Commonly known as: BENTYL TAKE 1 CAPSULE (10 MG TOTAL) BY MOUTH 4 (FOUR) TIMES DAILY - BEFORE MEALS AND AT BEDTIME.   fluconazole 150 MG tablet Commonly known as: DIFLUCAN Take 1 tablet (150 mg total) by mouth every other day. What changed:   when to take this  reasons to take this   fluocinonide 0.05 % external solution Commonly known as: LIDEX Apply 1 application topically 2 (two) times daily as needed (psoriasis).   ketoconazole 2 % shampoo Commonly known as: NIZORAL Apply 1 application topically 2 (two) times daily as needed (psoriasis).   loratadine 10 MG tablet Commonly known as: CLARITIN Take 10 mg by mouth daily.   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   mometasone 50 MCG/ACT nasal spray Commonly known as: NASONEX USE 2 SPRAYS IN EACH NOSTRIL AT BEDTIME AS NEEDED FOR ALLERGIES What changed: See the new instructions.   montelukast 10 MG tablet Commonly known as: SINGULAIR TAKE 1 TABLET (10 MG TOTAL) BY MOUTH AT BEDTIME. TAKE 1 TABLET(10 MG) BY MOUTH AT BEDTIME   ondansetron 4 MG disintegrating tablet Commonly known as: ZOFRAN-ODT Take 1 tablet (4 mg total) by mouth every 6 (six) hours as needed for nausea or vomiting.   oxyCODONE 5 MG immediate release tablet Commonly known as: Oxy IR/ROXICODONE Take 1  tablet (5 mg total) by mouth every 6 (six) hours as needed for severe pain. Notes to patient: May cause constipation.   May make you dizzy or drowsy. Do not drive or do anything that could be dangerous until you know how this medicine affects you.    pantoprazole 40 MG tablet Commonly known as: PROTONIX Take 1 tablet (40 mg total) by mouth daily.   pregabalin 100 MG capsule Commonly known as: LYRICA Take 1 capsule (100 mg total) by mouth 3 (three) times daily. What changed: when to take this   promethazine 12.5 MG tablet Commonly known as: PHENERGAN TAKE 1 TABLET BY MOUTH EVERY 6 HOURS AS NEEDED FOR MIGRAINES WITH NAUSEA What changed: See the new instructions.   traZODone 100 MG tablet Commonly known as: DESYREL Take 200 mg by mouth at bedtime.   Trintellix 20 MG Tabs tablet Generic drug: vortioxetine HBr Take 20 mg by mouth daily.   vitamin C  1000 MG tablet Take 1,000 mg by mouth.   Vitamin D (Ergocalciferol) 1.25 MG (50000 UNIT) Caps capsule Commonly known as: DRISDOL Take 1 capsule (50,000 Units total) by mouth every Saturday. Start taking on: November 07, 2019   zonisamide 50 MG capsule Commonly known as: ZONEGRAN Take 150 mg by mouth daily.       Follow-up Information    Surgery, [place]. Go on 11/26/2019.   Specialty: General Surgery Why: at 10 am Contact information: [address] [name] [address] [phone]        [place]. Go on 12/30/2019.   Contact information: Dr Johnathan Hausen [facility] [place]               The results of significant diagnostics from this hospitalization (including imaging, microbiology, ancillary and laboratory) are listed below for reference.    Significant Diagnostic Studies: DG UGI W SINGLE CM (SOL OR THIN BA)  Result Date: 11/02/2019 CLINICAL DATA:  History of sleeve gastrectomy. Hiatal hernia with GERD and esophagitis. EXAM: DG UGI W SINGLE CM TECHNIQUE: Initial scout AP supine  abdominal image obtained to insure adequate colon cleansing. Barium was introduced into the colon in a retrograde fashion and refluxed from the rectum to the cecum. Spot images of the colon followed by overhead radiographs were obtained. FLUOROSCOPY TIME:  Fluoroscopy Time:  42 seconds Radiation Exposure Index (if provided by the fluoroscopic device): 172 mGy Number of Acquired Spot Images: 0 COMPARISON:  CT AP 04/03/2019 FINDINGS: The esophagus appears patent.  No stricture or mass identified. Postoperative changes of the stomach compatible with previous sleeve gastrectomy. A moderate size hiatal hernia with multiple episodes of moderate gastroesophageal reflux noted. The duodenal bulb and duodenal C loop are unremarkable. IMPRESSION: 1. Moderate size hiatal hernia with gastroesophageal reflux. 2. Status post sleeve gastrectomy. Electronically Signed   By: Kerby Moors M.D.   On: 11/02/2019 10:27    Labs: Basic Metabolic Panel: Recent Labs  Lab 11/03/19 0630 11/03/19 1550 11/04/19 0712  NA 140  --  135  K 3.9  --  3.8  CL 104  --  104  CO2 23  --  24  GLUCOSE 109*  --  127*  BUN 9  --  <5*  CREATININE 0.64 0.66 0.62  CALCIUM 9.3  --  8.3*  MG  --   --  1.8   Liver Function Tests: Recent Labs  Lab 11/03/19 0630  AST 28  ALT 23  ALKPHOS 67  BILITOT 0.7  PROT 7.8  ALBUMIN 4.1    CBC: Recent Labs  Lab 11/03/19 1550 11/04/19 0332  WBC 12.1* 15.5*  NEUTROABS  --  13.4*  HGB 13.0 12.0  HCT 41.2 37.4  MCV 90.2 90.1  PLT 250 276    CBG: Recent Labs  Lab 11/03/19 0559  GLUCAP 118*    Principal Problem:   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

## 2019-11-04 NOTE — Progress Notes (Signed)
S: Had a decent night, got some rest. Pain is well controlled. No reflux or regurgitation- a big improvement for her. Tolerating liquids, walking halls.   O: Vitals, labs, intake/output, and orders reviewed at this time. Tmax 98.9, HR 58-79, normotensive, sats 100% RA. PO 570, UOP 3350. WBC 15.5 (12.1 preop), hgb 12 (13 preop). BMP/Mg pending.  In addition to schedule tylenol, has received morphine x 3 (1pm, 9pm, 3am), oxycodone x 2 (5pm, 11pm)  Gen: A&Ox3, no distress  H&N: EOMI, atraumatic, neck supple Chest: unlabored respirations, RRR Abd: soft, minimally tender, nondistended, incision(s) c/d/i with dermabond, no cellulitis or hematoma Ext: warm, no edema Neuro: grossly normal  Lines/tubes/drains: PIV  A/P: POD 1 s/p laparoscopic roux en y gastric bypass (conversion from sleeve gastrectomy) with hiatal hernia repair Continue clears/ protein shakes Ambulate, pulmonary toilet SQH, SCD  Possible discharge this afternoon  Romana Juniper, MD [facility], [place]

## 2019-11-09 ENCOUNTER — Telehealth (HOSPITAL_COMMUNITY): Payer: Self-pay

## 2019-11-09 NOTE — Telephone Encounter (Signed)
Patient called to discuss post bariatric surgery follow up questions.  See below:   1.  Tell me about your pain and pain management?denies  2.  Let's talk about fluid intake.  How much total fluid are you taking in?60 + ounces  3.  How much protein have you taken in the last 2 days?60 grams  4.  Have you had nausea?  Tell me about when have experienced nausea and what you did to help?denies  5.  Has the frequency or color changed with your urine?light in color and clear  6.  Tell me what your incisions look like?itchy but not red  7.  Have you been passing gas? BM?had multiple bowel movements  8.  If a problem or question were to arise who would you call?  Do you know contact numbers for [place], [facility], and [facility]? Aware of how to contact all resources  9.  How has the walking going?moving around  10.  How are your vitamins and calcium going?  How are you taking them?mvi and calcium samples now ordering the ones she likes

## 2019-11-16 ENCOUNTER — Encounter: Payer: No Typology Code available for payment source | Attending: Surgery | Admitting: Dietician

## 2019-11-16 ENCOUNTER — Other Ambulatory Visit: Payer: Self-pay

## 2019-11-16 ENCOUNTER — Encounter: Payer: Self-pay | Admitting: Dietician

## 2019-11-16 VITALS — Ht 61.0 in | Wt 186.6 lb

## 2019-11-16 DIAGNOSIS — Z713 Dietary counseling and surveillance: Secondary | ICD-10-CM | POA: Insufficient documentation

## 2019-11-16 DIAGNOSIS — K449 Diaphragmatic hernia without obstruction or gangrene: Secondary | ICD-10-CM | POA: Insufficient documentation

## 2019-11-16 DIAGNOSIS — Z903 Acquired absence of stomach [part of]: Secondary | ICD-10-CM | POA: Insufficient documentation

## 2019-11-16 DIAGNOSIS — Z6835 Body mass index (BMI) 35.0-35.9, adult: Secondary | ICD-10-CM

## 2019-11-16 DIAGNOSIS — R109 Unspecified abdominal pain: Secondary | ICD-10-CM | POA: Diagnosis not present

## 2019-11-16 NOTE — Patient Instructions (Signed)
   Begin adding some solid protein foods in 1/4 cup portions and slowly increase portions as tolerated. Allow up to 30 minutes to eat.   Gradually reduce protein shakes as the amount of solid protein intake increases.   Monitor protein intake to keep up with progress and ensure goals are met. Also monitor fluid intake.

## 2019-11-16 NOTE — Progress Notes (Signed)
Follow-up visit:  2 Weeks Post-Operative Gastric Bypass conversion Surgery  Medical Nutrition Therapy:  Appt start time: 1100 end time:  1130.  Primary concerns today: Post-operative Bariatric Surgery Nutrition Management.    Learning Readiness:   Change in progress  Weight: 186.6lbs Height: 5'1"  Date 08/07/19 11/16/19  BMI 36 35.3  Weight (lbs) 190.4 186.6  Skeletal muscle (lbs) 50.3 48.5  % body fat 51.2 51.8      Progress: . Patient reports surgery went well; reports occasional intermittent pain in abdomen, especially in early am and at night.  . She had constipation which might have been residural effect from barium swallow test. Now having loose stools, but also has IBS-D.  Dietary recall: specific choices might be at varying meal/ snack times Breakfast: protein drink Snack: yogurt  Lunch: chicken or beef broth Snack: yogurt  Dinner: protein drink, broth Snack: sugar free popsicle  Fluid intake: water 32-48oz, 2-3x 8oz broth, sf popsicle, 2 11oz protein drinks; Total 68-80oz daily Estimated total protein intake: 72-80g  Medications: Adderall, albuterol, atorvastatin, clobetasol, cyclobenzaprine, dicyclomine, fluconazole, fluocinonide, loratidine, mometasone nasal spray, montelukast, pantoprazole, pregabalin, promethazine prn, traZODone, Trintellix, zonisamide Supplementation: bariatric multivitamin 1x daily + iron with C 30mg  daily + calcium citrate 500mg  3x daily  Using straws: no Drinking while eating: n/a Hair loss: no (did have hair loss with previous surgery) Carbonated beverages: no N/V/D/C: one episode constipation; some loose stools Dumping syndrome: no   Recent physical activity:  Increased walking, daily movement  Progress Towards Goal(s):  In progress.  Handouts given during visit include:  Phase 3 and 4 bariatric diet handouts  Visit summary with goals and instructions   Nutritional Diagnosis:  [place]-3.3 Overweight/obesity As related to history  of excess calories, GERD, IBS, fibromyalgia affect food intake and physical activity.  As evidenced by patient with current BMI of 35.3, following bariatric diet after gastric bypass conversion from gastric sleeve.    Intervention:   . Reviewed progress since surgery. . Instructed on advancing diet to include solid protein foods, moist and soft, starting with 1/4c portions as tolerated, and gradually reducing protein drinks when able to meet daily protein goal with solid foods.  . Encouraged patient to monitor protein and fluid intake.  Marland Kitchen Discussed working to resume regular schedule of meals and snacks.  Teaching Method Utilized:  Visual Auditory Hands on  Barriers to learning/adherence to lifestyle change: none  Demonstrated degree of understanding via:  Teach Back   Monitoring/Evaluation:  Dietary intake, exercise, and body weight.     Follow up 01/04/20 at 11:00am for 2 month post-op visit.

## 2019-12-03 ENCOUNTER — Other Ambulatory Visit: Payer: Self-pay | Admitting: Family Medicine

## 2019-12-03 ENCOUNTER — Encounter: Payer: Self-pay | Admitting: Family Medicine

## 2019-12-03 DIAGNOSIS — J4520 Mild intermittent asthma, uncomplicated: Secondary | ICD-10-CM

## 2019-12-07 ENCOUNTER — Other Ambulatory Visit: Payer: Self-pay

## 2019-12-07 ENCOUNTER — Ambulatory Visit (INDEPENDENT_AMBULATORY_CARE_PROVIDER_SITE_OTHER): Payer: No Typology Code available for payment source | Admitting: Podiatry

## 2019-12-07 DIAGNOSIS — L600 Ingrowing nail: Secondary | ICD-10-CM | POA: Diagnosis not present

## 2019-12-07 MED ORDER — GENTAMICIN SULFATE 0.1 % EX CREA: 1.0000 "application " | TOPICAL_CREAM | Freq: Two times a day (BID) | CUTANEOUS | 1 refills | Status: DC

## 2019-12-07 NOTE — Patient Instructions (Signed)

## 2019-12-07 NOTE — Progress Notes (Signed)
   Subjective: Patient presents today for evaluation of pain to the lateral aspect of the bilateral great toes. Patient is concerned for possible ingrown nail. Patient presents today for further treatment and evaluation.  Past Medical History:  Diagnosis Date  . Anemia   . Anxiety   . Arthritis    hands, hips  . Asthma   . Bilateral leg cramps   . Depression   . Diabetes mellitus without complication ([place])    history no longer a problem since weight loss surgery  . DJD (degenerative joint disease)   . GERD (gastroesophageal reflux disease)   . Headache    Migraines  . IBS (irritable bowel syndrome)   . Kidney stone   . Low serum vitamin B12   . Lung nodules   . Migraine    down to approx 4x/mo since starting meds  . Muscle spasm   . Psoriasis    vaginal area  . Seasonal allergies   . Sleep apnea    history no longer a problem since weight loss surgery  . Vasovagal syncope    [place], Dr. Alveria Apley   . Vitamin D deficiency     Objective:  General: Well developed, nourished, in no acute distress, alert and oriented x3   Dermatology: Skin is warm, dry and supple bilateral. Lateral border of the bilateral great toes appears to be erythematous with evidence of an ingrowing nail. Pain on palpation noted to the border of the nail fold. The remaining nails appear unremarkable at this time. There are no open sores, lesions.  Vascular: Dorsalis Pedis artery and Posterior Tibial artery pedal pulses palpable. No lower extremity edema noted.   Neruologic: Grossly intact via light touch bilateral.  Musculoskeletal: Muscular strength within normal limits in all groups bilateral. Normal range of motion noted to all pedal and ankle joints.   Assesement: #1 Paronychia with ingrowing nail lateral border bilateral great toes #2 Pain in toe #3 Incurvated nail  Plan of Care:  1. Patient evaluated.  2. Discussed treatment alternatives and plan of care. Explained nail avulsion procedure  and post procedure course to patient. 3. Patient opted for permanent partial nail avulsion of the lateral border bilateral great toes.  4. Prior to procedure, local anesthesia infiltration utilized using 3 ml of a 50:50 mixture of 2% plain lidocaine and 0.5% plain marcaine in a normal hallux block fashion and a betadine prep performed.  5. Partial permanent nail avulsion with chemical matrixectomy performed using [id number] applications of phenol followed by alcohol flush.  6. Light dressing applied. 7. Prescription for gentamicin cream 8. Return to clinic 2 weeks.  Edrick Kins, DPM [facility]  Dr. Edrick Kins, [place]                                        [place], [place] [address]                Office [phone]  Fax [phone]

## 2019-12-08 ENCOUNTER — Encounter: Payer: Self-pay | Admitting: Podiatry

## 2019-12-08 ENCOUNTER — Other Ambulatory Visit: Payer: Self-pay | Admitting: Podiatry

## 2019-12-08 ENCOUNTER — Other Ambulatory Visit: Payer: Self-pay | Admitting: Family Medicine

## 2019-12-08 DIAGNOSIS — J4520 Mild intermittent asthma, uncomplicated: Secondary | ICD-10-CM

## 2019-12-08 MED ORDER — TRAMADOL HCL 50 MG PO TABS: 50.0000 mg | ORAL_TABLET | Freq: Four times a day (QID) | ORAL | 0 refills | Status: AC | PRN

## 2019-12-08 NOTE — Progress Notes (Signed)
PRN status post ingrown toenail procedures b/l

## 2019-12-14 ENCOUNTER — Other Ambulatory Visit: Payer: Self-pay | Admitting: Gastroenterology

## 2019-12-14 DIAGNOSIS — K219 Gastro-esophageal reflux disease without esophagitis: Secondary | ICD-10-CM

## 2019-12-14 NOTE — Telephone Encounter (Signed)
Last office visit 04/08/2019 IBS  Last refill 09/21/2019 0 refills

## 2019-12-19 ENCOUNTER — Other Ambulatory Visit: Payer: Self-pay | Admitting: Family Medicine

## 2019-12-19 DIAGNOSIS — E538 Deficiency of other specified B group vitamins: Secondary | ICD-10-CM

## 2019-12-19 NOTE — Telephone Encounter (Signed)
Requested medication (s) are due for refill today: yes  Requested medication (s) are on the active medication list: yes  Last refill:  09/30/19  Future visit scheduled: no  Notes to clinic:  med not assigned to a protocol   Requested Prescriptions  Pending Prescriptions Disp Refills   cyanocobalamin (,VITAMIN B-12,) 1000 MCG/ML injection [Pharmacy Med Name: CYANOCOBALAMIN 1,000 MCG/ML] 6 mL 0    Sig: INJECT 1 ML (1,000 MCG TOTAL) INTO THE MUSCLE EVERY 14 (FOURTEEN) DAYS.      Off-Protocol Failed - 12/19/2019  8:53 AM      Failed - Medication not assigned to a protocol, review manually.      Passed - Valid encounter within last 12 months    Recent Outpatient Visits           1 month ago Diet-controlled diabetes mellitus [facility])   [facility] Steele Sizer, MD   5 months ago Diet-controlled diabetes mellitus [facility])   [facility] Steele Sizer, MD   5 months ago History of COVID-19   [facility] Steele Sizer, MD   6 months ago [facility] Steele Sizer, MD   8 months ago Diet-controlled diabetes mellitus [facility])   [facility] Steele Sizer, MD       Future Appointments             In 2 months Agbor-Etang, Aaron Edelman, MD [facility], [facility]           Off-Protocol Failed - 12/19/2019  8:53 AM      Failed - Medication not assigned to a protocol, review manually.      Passed - Valid encounter within last 12 months    Recent Outpatient Visits           1 month ago Diet-controlled diabetes mellitus [facility])   [facility] Steele Sizer, MD   5 months ago Diet-controlled diabetes mellitus [facility])   [facility] Steele Sizer, MD   5 months ago History of COVID-19   [facility] Steele Sizer, MD   6 months ago [facility] Steele Sizer, MD    8 months ago Diet-controlled diabetes mellitus [facility])   [facility] Steele Sizer, MD       Future Appointments             In 2 months Agbor-Etang, Aaron Edelman, MD [facility], [place]

## 2019-12-21 ENCOUNTER — Other Ambulatory Visit: Payer: Self-pay

## 2019-12-23 ENCOUNTER — Ambulatory Visit (INDEPENDENT_AMBULATORY_CARE_PROVIDER_SITE_OTHER): Payer: No Typology Code available for payment source | Admitting: Podiatry

## 2019-12-23 ENCOUNTER — Other Ambulatory Visit: Payer: Self-pay

## 2019-12-23 DIAGNOSIS — L600 Ingrowing nail: Secondary | ICD-10-CM

## 2019-12-23 NOTE — Progress Notes (Signed)
   Subjective: 49 y.o. female presents today status post permanent nail avulsion procedure of the lateral border of the bilateral great toes that was performed on 12/07/2019.  Patient states that overall she is feeling very well.  She does have some sensitivity to the right great toe.  She has been soaking her foot and applying the antibiotic cream as directed.  No new complaints at this time  Past Medical History:  Diagnosis Date  . Anemia   . Anxiety   . Arthritis    hands, hips  . Asthma   . Bilateral leg cramps   . Depression   . Diabetes mellitus without complication ([place])    history no longer a problem since weight loss surgery  . DJD (degenerative joint disease)   . GERD (gastroesophageal reflux disease)   . Headache    Migraines  . IBS (irritable bowel syndrome)   . Kidney stone   . Low serum vitamin B12   . Lung nodules   . Migraine    down to approx 4x/mo since starting meds  . Muscle spasm   . Psoriasis    vaginal area  . Seasonal allergies   . Sleep apnea    history no longer a problem since weight loss surgery  . Vasovagal syncope    [place], Dr. Alveria Apley   . Vitamin D deficiency     Objective: Skin is warm, dry and supple. Nail and respective nail fold appears to be healing appropriately. Open wound to the associated nail fold with a granular wound base and moderate amount of fibrotic tissue. Minimal drainage noted. Mild erythema around the periungual region likely due to phenol chemical matricectomy.  Assessment: #1 postop permanent partial nail avulsion lateral border bilateral great toes #2 open wound periungual nail fold of respective digit.   Plan of care: #1 patient was evaluated  #2 debridement of open wound was performed to the periungual border of the respective toe using a currette. Antibiotic ointment and Band-Aid was applied. #3 patient is to return to clinic on a PRN basis.   Edrick Kins, DPM [facility]  Dr. Edrick Kins, [place]                                        [place], [place] [address]                Office [phone]  Fax [phone]

## 2020-01-04 ENCOUNTER — Encounter: Payer: Self-pay | Admitting: Dietician

## 2020-01-04 ENCOUNTER — Other Ambulatory Visit: Payer: Self-pay

## 2020-01-04 ENCOUNTER — Encounter: Payer: No Typology Code available for payment source | Attending: Surgery | Admitting: Dietician

## 2020-01-04 VITALS — Ht 61.0 in | Wt 183.6 lb

## 2020-01-04 DIAGNOSIS — E669 Obesity, unspecified: Secondary | ICD-10-CM | POA: Diagnosis present

## 2020-01-04 DIAGNOSIS — Z6834 Body mass index (BMI) 34.0-34.9, adult: Secondary | ICD-10-CM | POA: Insufficient documentation

## 2020-01-04 LAB — HM PAP SMEAR: HM Pap smear: NORMAL

## 2020-01-04 LAB — HM MAMMOGRAPHY: HM Mammogram: NORMAL (ref 0–4)

## 2020-01-04 NOTE — Progress Notes (Signed)
Nutrition Therapy for Post-Operative Bariatric Diet Follow-up visit:  2 months post-op [facility] gastric bypass Surgery  Medical Nutrition Therapy:  Appt start time: 1100 end time:  1130.  Anthropometrics: Weight: 183.6lbs Height: 5'1"  Date 01/04/20 11/16/19 08/07/19  BMI 34.7 35.3 36.0  Weight (lbs) 183.6 186.6 190.4  Skeletal muscle (lbs) 48.5 48.5 50.3  % body fat 50.5 51.8 51.2   Clinical: Medications: Adderall, albuterol, atorvastatin, clobetasol, cyclobenzaprine,dicyclomine, fluconazole, fluocinonide, fluconazole, loratidine, mometasone nasal spray, montelukast, omeprazole, ondansetron, pregabalin, promethazine prn, traZODone, trintellix, zonisamide Supplementation: bariatric multivitamin + iron 1x a day; calcium 3x a day, lactobacillus Health/ medical history changes: no changes GI symptoms: N/V/D/C: some episodes of vomiting due to plugging Dumping Syndrome: no Hair loss:   Dietary/ Lifestyle Progress: . 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 The swelling is gradually improving, but she is having some episodes of feeling as if food is stuck, which results in vomiting. She had a particularly bad episode 01/01/20 which lasted several hours after eating sashimi; also had an episode after eating chicken wings. . She reports weight at home of 176lbs; feels she is having some fluid retention due to menstrual cycle.  . Patient is taking measures to ensure foods are tender and moist; she is avoiding overcooked, dry meats. She is avoiding fluids during meals.  Dietary recall: Eating pattern: 3 meals and 2-3 snacks daily Dining out: 1-2 times a week, finishes leftovers at home Breakfast: 1soft scrambled egg with cheese, occasionally with 1 slice [place] bacon cooked softly Snack:  1 container yogurt 15g protein; occ beef jerky or protein shake  Lunch: leftover eggs; leftovers from weekend restaurant meals;  has tried ground venison; sushi no rice; ceviche Snack: yogurt  Dinner: chicken (moist) occ with few bites veg; lobster pieces Snack: sugar free popsicle  Fluid intake:  >64oz daily, mostly water Estimated total protein intake: 55-65g daily Bariatric diet adherence:  . Using straws: no . Drinking fluids during meals: no . Carbonated beverages: no  Recent physical activity:  Ready to begin, now cleared by MD; will begin walking and using bowflex   Nutrition Intervention:   . Reviewed progress since previous visit . Discussed keeping diet advancement slow due to potential for GI distress. Advised adding vegetables or fruits only after consuming adequate protein.  . Provided tips for plugging/ indigestion that advise low fat, non-spicy foods, in addition to other strategies patient has already implemented.  Nutritional Diagnosis:  Smyer-3.3 Overweight/obesity As related to history of excess calories, GERD, IBS, fibromyalgia affecting food intake and physical activity.  As evidenced by patient with current BMI of 34.7, following bariatric diet guidelines after bariatric surgical revision.  Teaching Method Utilized:  Animator provided:  Plugging and Indigestion (AND)  Visit summary with goals and instructions  Learning Readiness:   Change in progress  Barriers to learning/adherence to lifestyle change: none  Demonstrated degree of understanding via:  Teach Back      Plan: . Return for 57-month post-op MNT on 04/29/20 at 2:30pm

## 2020-01-04 NOTE — Patient Instructions (Signed)
   Continue to increase low carb vegetables as long as you are able to meet daily protein goals.   Advance diet slowly to allow for healing and reduce risk of esophageal spasms/ vomiting.  Gradually resume exercise as tolerated.

## 2020-01-05 ENCOUNTER — Other Ambulatory Visit: Payer: Self-pay | Admitting: Family Medicine

## 2020-01-05 DIAGNOSIS — M797 Fibromyalgia: Secondary | ICD-10-CM

## 2020-01-05 NOTE — Telephone Encounter (Signed)
Requested medications are due for refill today yes  Requested medications are on the active medication list yes  Last refill 6/1  Last visit 10/2019  Future visit scheduled no  Notes to clinic Not Delegated.

## 2020-01-05 NOTE — Telephone Encounter (Signed)
APPT MADE FOR THURSDAY

## 2020-01-06 NOTE — Progress Notes (Signed)
Name: Claire [name]   MRN: [id number]    DOB: [birth year]   Date:01/07/2020       Progress Note  Subjective  Chief Complaint  Follow up   HPI   Major Depression:she is now seeing Dr. Nicolasa Ducking who is also giving her therapy.  She is off Zolot and is currently on Trintelix 20 mg and off Lunesta and taking Trazodone for sleep and is sleeping well now .  She had side effects with Ambien ( interrupted sleep and sleep walking) , or Temazepam ( groggy ).She is following Dr. Waylan Boga recommendation. She is under more stress because her job of 20 years just ended - her position was eliminated  . Asthma MildIntermittentt:she states doing well, no cough, wheezing or sob, very seldom uses rescue inhaler   Migraine: she is still seeing Dr. Domingo Cocking - she is on  Zonegran, also taking promethazine . She is doing better now, she has on average 1 episodes per month, she has tension headaches intermittent . Improving   DMII: diagnosed at age 55, took medication for a while, but stopped all medications 10/25/2010 after bariatric surgery,glucose at home has been well controlled, no polyphagia, polyuria or polydipsia. Maximum weight of 265 lbs and wasdown to 118.3lbs - lowest weight back in 08/2017),she was gaining weight since she started to feelbetter after umbilical hernia repair with  lysis of adhesions and cecopexy back in 04/01/2018, she had a bariatric surgery revision 11/03/2019 by Dr. Hassell Done. She is off Ozempic for now.   IBS/GERD history of bypass surgery:  She was doing well after  lysis of adhesions and cecopexy back in 04/01/2018.However recently had to go back to [facility] with severe abdominal pain, she has since seen GI and was switched from Pantoprazole to Omeprazole and was given Bentyl o take prn before meals. She was advised to see surgeon and had revision of bariatric surgery, she states taking probiotics also and no longer having a lot of pain anymore.  She still has intermittent vomiting  but is seldom now   Atherosclerosis of aorta and hyperlipidemia: on statin therapy, no side effects  B12 deficiency: shehas been getting B12 injections weekly since Nov and last level was at goal at 808. Continue supplementation since doing well   Thyroiditis: she had a neck lump that decreased in size at the time T4 was high and TSH suppressed, we will recheck today.   Dizziness/syncope: seen at [facility], and has seen Dr. Margarito Courser 03/2019 , they ruled out POTS but may have had vasovagal syncope. Today her bp is very low, she states she is trying to get up slowly from her bed. Drinking plenty of fluids. She will follow up with cardiologist   Neck mass: she noticed a lump that is tender on right side of her neck back in Feb , she went to ENT but the time she was seen the swelling was down, she was given diagnosis of thyroiditis. TSH was low and last time it was high, she has been tired, we will recheck level   History of COVID: she states over the weekend her pulse ox dropped to 91 % when at a friends house. She has been back to work, she still has mental fogginess, and feel tired    FMS: she is still having pain, we adjusted her dose to Lyrica 100 mg TID but she usually takes it only BID. She states having a flare, legs are super sore. She is under more stress since she lost her  job    Patient Active Problem List   Diagnosis Date Noted  . 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/03/2019  . B12 deficiency 04/10/2018  . Columnar epithelial-lined lower esophagus   . Postprocedural disorder of digestive system   . Pain, abdominal, epigastric   . Esophageal dysphagia   . Generalized abdominal pain   . Persistent mood (affective) disorder, unspecified ([place]) 01/15/2018  . Insomnia related to another mental disorder 01/15/2018  . OCD (obsessive compulsive disorder) 10/29/2017  . GAD  (generalized anxiety disorder) 10/29/2017  . Nephrolithiasis 10/22/2017  . Vasovagal syncope 09/05/2017  . Atherosclerosis of aorta ([place]) 12/21/2016  . Hiatal hernia 08/17/2016  . Diverticulosis of colon 08/17/2016  . Iron deficiency anemia 08/08/2015  . Lumbosacral pain 01/07/2015  . Muscle twitching 01/07/2015  . Intertrigo 11/05/2014  . Perennial allergic rhinitis 09/03/2014  . Lung nodules 09/03/2014  . Vitamin D deficiency 09/03/2014  . Diet-controlled diabetes mellitus ([place]) 09/03/2014  . History of kidney stones 09/03/2014  . Chronic insomnia 09/03/2014  . History of iron deficiency anemia 09/03/2014  . Depression with anxiety 09/03/2014  . Palpitations 06/22/2014  . Bariatric surgery status 12/03/2013  . Asthma, mild persistent 11/21/2012  . CN (constipation) 11/21/2012  . GERD without esophagitis 11/21/2012  . History of hyperlipidemia 11/21/2012  . Migraine with aura and without status migrainosus 11/21/2012  . History of sleep apnea 11/21/2012  . IBS (irritable bowel syndrome) 08/15/2012    Past Surgical History:  Procedure Laterality Date  . BLADDER SURGERY  2010  . BREAST BIOPSY Right 2014   stereotatic biopsy  . CHOLECYSTECTOMY  2010  . COLONOSCOPY  2014    Done at [facility]  . COLONOSCOPY WITH PROPOFOL N/A 01/28/2018   Procedure: COLONOSCOPY WITH PROPOFOL;  Surgeon: Virgel Manifold, MD;  Location: [facility];  Service: Endoscopy;  Laterality: N/A;  . DILITATION & CURRETTAGE/HYSTROSCOPY WITH NOVASURE ABLATION N/A 06/16/2015   Procedure: DILATATION & CURETTAGE/HYSTEROSCOPY WITH NOVASURE ABLATION;  Surgeon: Brien Few, MD;  Location: [facility];  Service: Gynecology;  Laterality: N/A;  . ESOPHAGOGASTRODUODENOSCOPY (EGD) WITH PROPOFOL N/A 01/28/2018   Procedure: ESOPHAGOGASTRODUODENOSCOPY (EGD) WITH BIOPSIES;  Surgeon: Virgel Manifold, MD;  Location: [facility];  Service: Endoscopy;  Laterality: N/A;  . GASTRIC ROUX-EN-Y N/A 11/03/2019    Procedure: LAPAROSCOPIC 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 WITH UPPER ENDOSCOPY;  Surgeon: Johnathan Hausen, MD;  Location: [facility];  Service: General;  Laterality: N/A;  . HIATAL HERNIA REPAIR N/A 11/03/2019   Procedure: HERNIA REPAIR HIATAL;  Surgeon: Johnathan Hausen, MD;  Location[facility];  Service: General;  Laterality: N/A;  . LAPAROSCOPIC GASTRIC SLEEVE RESECTION  2012  . LAPAROSCOPY N/A 06/16/2015   Procedure: LAPAROSCOPY DIAGNOSTIC;  Surgeon: Brien Few, MD;  Location: [facility];  Service: Gynecology;  Laterality: N/A;  . LAPAROSCOPY N/A 04/01/2018   Procedure: LAPAROSCOPY DIAGNOSTIC ERAS PATHWAY ENTEROLYSIS, CECOPEXY;  Surgeon: Johnathan Hausen, MD;  Location[facility];  Service: General;  Laterality: N/A;  . LITHOTRIPSY  12/11/2017   laser lithotripsy  . LYSIS OF ADHESION N/A 06/16/2015   Procedure: LYSIS OF ADHESION;  Surgeon: Brien Few, MD;  Location: [facility];  Service: Gynecology;  Laterality: N/A;  . PLANTAR FASCIA SURGERY Bilateral   . ROBOTIC ASSISTED SALPINGO OOPHERECTOMY Right 06/16/2015   Procedure: ROBOTIC ASSISTED SALPINGO OOPHORECTOMY, EXCISION OF RIGHT CUL DE [facility] MASS;  Surgeon: Brien Few, MD;  Location: [facility];  Service: Gynecology;  Laterality: Right;  . TONSILLECTOMY    . UPPER GI ENDOSCOPY      Family History  Problem Relation Age of Onset  . Heart attack Mother   . Stroke Mother   . Multiple myeloma Mother   . Hyperlipidemia Father   . Heart attack Sister   . Heart attack Maternal Uncle   . Heart attack Paternal Grandfather   . Breast cancer Paternal Aunt   . Breast cancer Paternal Grandmother   . Lung cancer Maternal Grandfather     Social History   Tobacco Use  . Smoking status: Former Smoker    Packs/day: 1.00    Years: 10.00    Pack years: 10.00    Types: Cigarettes    Quit date: 02/19/1993    Years since quitting: 26.8  . Smokeless tobacco: Never Used  Substance Use Topics  . Alcohol use: Yes     Alcohol/week: 0.0 standard drinks    Comment: occasionally     Current Outpatient Medications:  .  Accu-Chek FastClix Lancets MISC, USE TO TEST UP TO 4 TIMES A DAY, Disp: , Rfl:  .  ACCU-CHEK GUIDE test strip, , Disp: , Rfl:  .  ADDERALL XR 30 MG 24 hr capsule, Take 30 mg by mouth daily. , Disp: , Rfl:  .  albuterol (VENTOLIN HFA) 108 (90 Base) MCG/ACT inhaler, TAKE 2 PUFFS EVERY 6 HOURS AS NEEDED FOR WHEEZING OR SHORTNESS OF BREATH. (VENTOLIN NOT COVERED), Disp: 8.5 each, Rfl: 2 .  B-D 3CC LUER-LOK SYR 25GX1" 25G X 1" 3 ML MISC, , Disp: , Rfl:  .  calcium citrate-vitamin D (CALCIUM CITRATE CHEWY BITE) 500-500 MG-UNIT chewable tablet, Chew 1 tablet by mouth 3 (three) times daily., Disp: , Rfl:  .  clobetasol ointment (TEMOVATE) 1.74 %, Apply 1 application topically daily as needed (psoriasis). , Disp: , Rfl: 0 .  cyanocobalamin (,VITAMIN B-12,) 1000 MCG/ML injection, INJECT 1 ML (1,000 MCG TOTAL) INTO THE MUSCLE EVERY 14 (FOURTEEN) DAYS. (Patient taking differently: Inject 1,000 mcg into the muscle every 14 (fourteen) days. Wednesdays), Disp: 6 mL, Rfl: 0 .  cyclobenzaprine (FLEXERIL) 10 MG tablet, Take 1 tablet (10 mg total) by mouth at bedtime. Prn (Patient taking differently: Take 10 mg by mouth at bedtime as needed (migraine). ), Disp: 90 tablet, Rfl: 1 .  fluconazole (DIFLUCAN) 150 MG tablet, Take 1 tablet (150 mg total) by mouth every other day. (Patient taking differently: Take 150 mg by mouth daily as needed (yeast infection.). ), Disp: 9 tablet, Rfl: 2 .  fluocinonide (LIDEX) 0.05 % external solution, Apply 1 application topically 2 (two) times daily as needed (psoriasis). , Disp: , Rfl:  .  gentamicin cream (GARAMYCIN) 0.1 %, Apply 1 application topically 2 (two) times daily., Disp: 30 g, Rfl: 1 .  IRON-VITAMIN C PO, Take 1 tablet by mouth daily., Disp: , Rfl:  .  ketoconazole (NIZORAL) 2 % shampoo, Apply 1 application topically 2 (two) times daily as needed (psoriasis). , Disp: , Rfl:   .  Lactobacillus (ACIDOPHILUS PO), Take 1 capsule by mouth daily. Probiotic plus Calcium, Disp: , Rfl:  .  loratadine (CLARITIN) 10 MG tablet, Take 10 mg by mouth daily., Disp: , Rfl:  .  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 , Disp: , Rfl: 4 .  mometasone (NASONEX) 50 MCG/ACT nasal spray, USE 2 SPRAYS IN EACH NOSTRIL AT BEDTIME AS NEEDED FOR  ALLERGIES (Patient taking differently: Place 2 sprays into the nose daily. ), Disp: 17 g, Rfl: 1 .  montelukast (SINGULAIR) 10 MG tablet, TAKE 1 TABLET (10 MG TOTAL) BY MOUTH AT BEDTIME. TAKE 1 TABLET(10 MG) BY MOUTH AT BEDTIME, Disp: 90 tablet, Rfl: 3 .  Multiple Vitamins-Minerals (MULTIVITAMIN ADULT) CHEW, Chew 1 tablet by mouth daily., Disp: , Rfl:  .  ondansetron (ZOFRAN) 4 MG tablet, Take by mouth., Disp: , Rfl:  .  pantoprazole (PROTONIX) 40 MG tablet, Take 40 mg by mouth daily., Disp: , Rfl:  .  pregabalin (LYRICA) 100 MG capsule, Take 1 capsule (100 mg total) by mouth 3 (three) times daily. (Patient taking differently: Take 100 mg by mouth 2 (two) times daily. ), Disp: 270 capsule, Rfl: 0 .  promethazine (PHENERGAN) 12.5 MG tablet, TAKE 1 TABLET BY MOUTH EVERY 6 HOURS AS NEEDED FOR MIGRAINES WITH NAUSEA (Patient taking differently: Take 12.5 mg by mouth every 6 (six) hours as needed for nausea or vomiting (MIGRAINES WITH NAUSEA). ), Disp: 20 tablet, Rfl: 0 .  traZODone (DESYREL) 100 MG tablet, Take 200 mg by mouth at bedtime. , Disp: , Rfl:  .  TRINTELLIX 20 MG TABS tablet, Take 20 mg by mouth daily. , Disp: , Rfl:  .  Vitamin D, Ergocalciferol, (DRISDOL) 1.25 MG (50000 UNIT) CAPS capsule, Take 1 capsule (50,000 Units total) by mouth every Saturday., Disp: 12 capsule, Rfl: 1 .  zonisamide (ZONEGRAN) 50 MG capsule, Take 150 mg by mouth daily. , Disp: , Rfl: 2 .  atorvastatin (LIPITOR) 20 MG tablet, Take 1 tablet (20 mg total) by mouth daily., Disp: 30 tablet, Rfl: 6 .  omeprazole  (PRILOSEC) 40 MG capsule, TAKE 1 CAPSULE (40 MG TOTAL) BY MOUTH 2 (TWO) TIMES DAILY BEFORE A MEAL. (Patient not taking: Reported on 01/07/2020), Disp: 180 capsule, Rfl: 0  Allergies  Allergen Reactions  . Aspartame And Phenylalanine Nausea Only and Other (See Comments)    GI upset   . Gabapentin     Bladder incontinence at higher dose  . Linzess [Linaclotide]     Worsening of diarrhea  . Other Hives and Other (See Comments)    Ricotta Cheese: flushed and hot  . Adhesive [Tape] Rash    Some clear tapes  . Cymbalta [Duloxetine Hcl] Palpitations    And photosensitivity  . Tapentadol Rash    Some clear tapes    I personally reviewed active problem list, medication list, allergies, family history, social history, health maintenance with the patient/caregiver today.   ROS  Constitutional: Negative for fever or weight change.  Respiratory: Negative for cough and shortness of breath.   Cardiovascular: Negative for chest pain or palpitations.  Gastrointestinal: Negative for abdominal pain, no bowel changes.  Musculoskeletal: Negative for gait problem or joint swelling.  Skin: Negative for rash.  Neurological: Negative for dizziness , positive for headache.  No other specific complaints in a complete review of systems (except as listed in HPI above).  Objective  Vitals:   01/07/20 1146  BP: 122/76  Pulse: 88  Resp: 16  Temp: 98.1 F (36.7 C)  TempSrc: Oral  SpO2: 99%  Weight: 180 lb 12.8 oz (82 kg)  Height: 5' 1"  (1.549 m)    Body mass index is 34.16 kg/m.  Physical Exam  Constitutional: Patient appears well-developed and well-nourished. Obese  No distress.  HEENT: head atraumatic, normocephalic, pupils equal and reactive to light,  neck supple Cardiovascular: Normal rate, regular rhythm and normal heart sounds.  No murmur heard. No BLE edema. Pulmonary/Chest: Effort normal and breath sounds normal. No respiratory distress. Abdominal: Soft.  There is no  tenderness. Psychiatric: Patient has a normal mood and affect. behavior is normal. Judgment and thought content normal.  Recent Results (from the past 2160 hour(s))  CBC per protocol     Status: None   Collection Time: 10/28/19  8:45 AM  Result Value Ref Range   WBC 8.3 4.0 - 10.5 K/uL   RBC 4.75 3.87 - 5.11 MIL/uL   Hemoglobin 13.8 12.0 - 15.0 g/dL   HCT 42.0 36 - 46 %   MCV 88.4 80.0 - 100.0 fL   MCH 29.1 26.0 - 34.0 pg   MCHC 32.9 30.0 - 36.0 g/dL   RDW 13.5 11.5 - 15.5 %   Platelets 285 150 - 400 K/uL   nRBC 0.0 0.0 - 0.2 %    Comment: Performed at [facility], [place] [address], [place], [place] [address]  Basic metabolic panel per protocol     Status: Abnormal   Collection Time: 10/28/19  8:45 AM  Result Value Ref Range   Sodium 137 135 - 145 mmol/L   Potassium 3.7 3.5 - 5.1 mmol/L   Chloride 106 98 - 111 mmol/L   CO2 24 22 - 32 mmol/L   Glucose, Bld 106 (H) 70 - 99 mg/dL    Comment: Glucose reference range applies only to samples taken after fasting for at least 8 hours.   BUN 12 6 - 20 mg/dL   Creatinine, Ser 0.67 0.44 - 1.00 mg/dL   Calcium 8.8 (L) 8.9 - 10.3 mg/dL   GFR calc non Af Amer >60 >60 mL/min   GFR calc Af Amer >60 >60 mL/min   Anion gap 7 5 - 15    Comment: Performed at [facility], [place] [address], [place], [place] [address]  Hemoglobin A1c per protocol     Status: None   Collection Time: 10/28/19  8:45 AM  Result Value Ref Range   Hgb A1c MFr Bld 5.5 4.8 - 5.6 %    Comment: (NOTE) Pre diabetes:          5.7%-6.4%  Diabetes:              >6.4%  Glycemic control for   <7.0% adults with diabetes    Mean Plasma Glucose 111.15 mg/dL    Comment: Performed at [place] [address] [place], [name] [address]  SARS CORONAVIRUS 2 (TAT 6-24 HRS) Nasopharyngeal Nasopharyngeal Swab     Status: None   Collection Time: 10/30/19  3:12 PM   Specimen: Nasopharyngeal Swab  Result Value Ref Range   SARS  Coronavirus 2 NEGATIVE NEGATIVE    Comment: (NOTE) SARS-CoV-2 target nucleic acids are NOT DETECTED.  The SARS-CoV-2 RNA is generally detectable in upper and lower respiratory specimens during the acute phase of infection. Negative results do not preclude SARS-CoV-2 infection, do not rule out co-infections with other pathogens, and should not be used as the sole basis for treatment or other patient management decisions. Negative results must be combined with clinical observations, patient history, and epidemiological information. The expected result is Negative.  Fact Sheet for Patients: SugarRoll.be  Fact Sheet for Healthcare Providers: https://www.woods-mathews.com/  This test is not yet approved or cleared by the [place] FDA and  has been authorized for detection and/or diagnosis of SARS-CoV-2 by FDA under an Emergency Use Authorization (EUA). This EUA will remain  in effect (meaning this test can be used) for the duration of the COVID-19 declaration under Se ction 564(b)(1) of the Act, 21 U.S.C. section 360bbb-3(b)(1), unless the authorization is terminated or revoked sooner.  Performed at [facility], [place] [address] [place], [name] [address]   Glucose, capillary     Status: Abnormal   Collection Time: 11/03/19  5:59 AM  Result Value Ref Range   Glucose-Capillary 118 (H) 70 - 99 mg/dL    Comment: Glucose reference range applies only to samples taken after fasting for at least 8 hours.   Comment 1 Notify RN   Type and screen [place]     Status: None   Collection Time: 11/03/19  6:30 AM  Result Value Ref Range   ABO/RH(D) A POS    Antibody Screen NEG    Sample Expiration      11/06/2019,2359 Performed at [facility], [place] [address], [place], [place] [address]   Comprehensive metabolic panel     Status: Abnormal   Collection Time: 11/03/19  6:30 AM  Result Value Ref Range    Sodium 140 135 - 145 mmol/L   Potassium 3.9 3.5 - 5.1 mmol/L   Chloride 104 98 - 111 mmol/L   CO2 23 22 - 32 mmol/L   Glucose, Bld 109 (H) 70 - 99 mg/dL    Comment: Glucose reference range applies only to samples taken after fasting for at least 8 hours.   BUN 9 6 - 20 mg/dL   Creatinine, Ser 0.64 0.44 - 1.00 mg/dL   Calcium 9.3 8.9 - 10.3 mg/dL   Total Protein 7.8 6.5 - 8.1 g/dL   Albumin 4.1 3.5 - 5.0 g/dL   AST 28 15 - 41 U/L   ALT 23 0 - 44 U/L   Alkaline Phosphatase 67 38 - 126 U/L   Total Bilirubin 0.7 0.3 - 1.2 mg/dL   GFR calc non Af Amer >60 >60 mL/min   GFR calc Af Amer >60 >60 mL/min   Anion gap 13 5 - 15    Comment: Performed at [facility], [place] [address] [place], [place] [address]  hCG, serum, qualitative (Not at [facility])     Status: None   Collection Time: 11/03/19  6:30 AM  Result Value Ref Range   Preg, Serum NEGATIVE NEGATIVE    Comment:        THE SENSITIVITY OF THIS METHODOLOGY IS >10 mIU/mL. Performed at [facility], [place] [address], [place], [place] [address]   ABO/Rh     Status: None   Collection Time: 11/03/19  6:30 AM  Result Value Ref Range   ABO/RH(D)      A POS Performed at [facility], [place] [address] [place], [place] [address]   CBC     Status: Abnormal   Collection Time: 11/03/19  3:50 PM  Result Value Ref Range   WBC 12.1 (H) 4.0 - 10.5 K/uL   RBC 4.57 3.87 - 5.11 MIL/uL   Hemoglobin 13.0 12.0 - 15.0 g/dL   HCT 41.2 36 - 46 %   MCV 90.2 80.0 - 100.0 fL   MCH 28.4 26.0 - 34.0 pg   MCHC 31.6 30.0 - 36.0 g/dL   RDW 13.8 11.5 - 15.5 %   Platelets 250 150 - 400 K/uL   nRBC 0.0 0.0 - 0.2 %    Comment: Performed at [facility], [place] [address]  [place], [place] [address]  Creatinine, serum     Status: None   Collection Time: 11/03/19  3:50 PM  Result Value Ref Range   Creatinine, Ser 0.66 0.44 - 1.00 mg/dL   GFR calc non Af Amer >60 >60 mL/min   GFR calc Af  Amer >60 >60 mL/min    Comment: Performed at [facility], [place] [address], [place], [place] [address]  CBC WITH DIFFERENTIAL     Status: Abnormal   Collection Time: 11/04/19  3:32 AM  Result Value Ref Range   WBC 15.5 (H) 4.0 - 10.5 K/uL   RBC 4.15 3.87 - 5.11 MIL/uL   Hemoglobin 12.0 12.0 - 15.0 g/dL   HCT 37.4 36 - 46 %   MCV 90.1 80.0 - 100.0 fL   MCH 28.9 26.0 - 34.0 pg   MCHC 32.1 30.0 - 36.0 g/dL   RDW 13.9 11.5 - 15.5 %   Platelets 276 150 - 400 K/uL   nRBC 0.0 0.0 - 0.2 %   Neutrophils Relative % 87 %   Neutro Abs 13.4 (H) 1.7 - 7.7 K/uL   Lymphocytes Relative 6 %   Lymphs Abs 1.0 0.7 - 4.0 K/uL   Monocytes Relative 6 %   Monocytes Absolute 1.0 0.1 - 1.0 K/uL   Eosinophils Relative 0 %   Eosinophils Absolute 0.0 0.0 - 0.5 K/uL   Basophils Relative 0 %   Basophils Absolute 0.0 0.0 - 0.1 K/uL   Immature Granulocytes 1 %   Abs Immature Granulocytes 0.07 0.00 - 0.07 K/uL    Comment: Performed at [facility], [place] [address], [place], [place] [address]  Basic metabolic panel     Status: Abnormal   Collection Time: 11/04/19  7:12 AM  Result Value Ref Range   Sodium 135 135 - 145 mmol/L   Potassium 3.8 3.5 - 5.1 mmol/L   Chloride 104 98 - 111 mmol/L   CO2 24 22 - 32 mmol/L   Glucose, Bld 127 (H) 70 - 99 mg/dL    Comment: Glucose reference range applies only to samples taken after fasting for at least 8 hours.   BUN <5 (L) 6 - 20 mg/dL   Creatinine, Ser 0.62 0.44 - 1.00 mg/dL   Calcium 8.3 (L) 8.9 - 10.3 mg/dL   GFR calc non Af Amer >60 >60 mL/min   GFR calc Af Amer >60 >60 mL/min   Anion gap 7 5 - 15    Comment: Performed at [facility], [place] [address], [place], [place] [address]  Magnesium     Status: None   Collection Time: 11/04/19  7:12 AM  Result Value Ref Range   Magnesium 1.8 1.7 - 2.4 mg/dL    Comment: Performed at [facility], [place] [address], [place], [place] [address]  HM  MAMMOGRAPHY     Status: None   Collection Time: 01/04/20 12:00 AM  Result Value Ref Range   HM Mammogram Self Reported Normal 0-4 Bi-Rad, Self Reported Normal      PHQ2/9: Depression screen [facility] 2/9 01/07/2020 07/21/2019 07/15/2019 06/04/2019 06/02/2019  Decreased Interest 0 0 0 0 0  Down, Depressed, Hopeless 1 0 0 0 0  PHQ - 2 Score 1 0 0 0 0  Altered sleeping 0 0 0 - 0  Tired, decreased energy 1 0 2 - 0  Change in appetite 0 0 0 - 0  Feeling bad or failure about yourself  0 0 0 -  0  Trouble concentrating 0 0 2 - 0  Moving slowly or fidgety/restless 0 0 0 - 0  Suicidal thoughts 0 0 0 - 0  PHQ-9 Score 2 0 4 - 0  Difficult doing work/chores Not difficult at all Not difficult at all Somewhat difficult - Not difficult at all  Some recent data might be hidden    phq 9 is positive  Fall Risk: Fall Risk  01/07/2020 08/07/2019 07/21/2019 07/15/2019 06/18/2019  Falls in the past year? 0 0 0 0 0  Number falls in past yr: 0 - - 0 -  Injury with Fall? 0 - - 0 -  Follow up - - - Falls evaluation completed -    Functional Status Survey: Is the patient deaf or have difficulty hearing?: No Does the patient have difficulty seeing, even when wearing glasses/contacts?: No Does the patient have difficulty concentrating, remembering, or making decisions?: No Does the patient have difficulty walking or climbing stairs?: Yes Does the patient have difficulty dressing or bathing?: No Does the patient have difficulty doing errands alone such as visiting a doctor's office or shopping?: No    Assessment & Plan  1. Diet-controlled diabetes mellitus (HCC)  - Microalbumin / creatinine urine ratio  2. B12 deficiency  Continue supplementation   3. Atherosclerosis of aorta (HCC)  - Lipid panel  4. Vitamin D deficiency  Continue supplementation   5. GERD without esophagitis   6. Migraine with aura and without status migrainosus, not intractable   7. Thyroiditis  We will recheck labs   8.  History of bariatric surgery   9. Need for hepatitis C screening test  - Hepatitis C antibody  10. COVID-19 long hauler   11. History of thyroiditis  - TSH  12. Elevated TSH  - TSH

## 2020-01-07 ENCOUNTER — Ambulatory Visit (INDEPENDENT_AMBULATORY_CARE_PROVIDER_SITE_OTHER): Payer: No Typology Code available for payment source | Admitting: Family Medicine

## 2020-01-07 ENCOUNTER — Other Ambulatory Visit: Payer: Self-pay

## 2020-01-07 ENCOUNTER — Encounter: Payer: Self-pay | Admitting: Family Medicine

## 2020-01-07 VITALS — BP 122/76 | HR 88 | Temp 98.1°F | Resp 16 | Ht 61.0 in | Wt 180.8 lb

## 2020-01-07 DIAGNOSIS — G43109 Migraine with aura, not intractable, without status migrainosus: Secondary | ICD-10-CM

## 2020-01-07 DIAGNOSIS — Z8639 Personal history of other endocrine, nutritional and metabolic disease: Secondary | ICD-10-CM

## 2020-01-07 DIAGNOSIS — E538 Deficiency of other specified B group vitamins: Secondary | ICD-10-CM

## 2020-01-07 DIAGNOSIS — E069 Thyroiditis, unspecified: Secondary | ICD-10-CM

## 2020-01-07 DIAGNOSIS — E119 Type 2 diabetes mellitus without complications: Secondary | ICD-10-CM | POA: Diagnosis not present

## 2020-01-07 DIAGNOSIS — Z23 Encounter for immunization: Secondary | ICD-10-CM | POA: Diagnosis not present

## 2020-01-07 DIAGNOSIS — Z1159 Encounter for screening for other viral diseases: Secondary | ICD-10-CM

## 2020-01-07 DIAGNOSIS — Z9884 Bariatric surgery status: Secondary | ICD-10-CM

## 2020-01-07 DIAGNOSIS — E559 Vitamin D deficiency, unspecified: Secondary | ICD-10-CM

## 2020-01-07 DIAGNOSIS — I700 Atherosclerosis of aorta: Secondary | ICD-10-CM | POA: Diagnosis not present

## 2020-01-07 DIAGNOSIS — U099 Post covid-19 condition, unspecified: Secondary | ICD-10-CM | POA: Insufficient documentation

## 2020-01-07 DIAGNOSIS — K219 Gastro-esophageal reflux disease without esophagitis: Secondary | ICD-10-CM

## 2020-01-07 DIAGNOSIS — R7989 Other specified abnormal findings of blood chemistry: Secondary | ICD-10-CM

## 2020-01-07 DIAGNOSIS — F325 Major depressive disorder, single episode, in full remission: Secondary | ICD-10-CM

## 2020-01-08 ENCOUNTER — Other Ambulatory Visit: Payer: Self-pay | Admitting: Family Medicine

## 2020-01-08 DIAGNOSIS — M797 Fibromyalgia: Secondary | ICD-10-CM

## 2020-01-08 LAB — LIPID PANEL
Cholesterol: 191 mg/dL (ref <200)
HDL: 51 mg/dL (ref >=50)
LDL Cholesterol (Calc): 115 mg/dL (calc) — ABNORMAL HIGH
Non-HDL Cholesterol (Calc): 140 mg/dL (calc) — ABNORMAL HIGH (ref <130)
Total CHOL/HDL Ratio: 3.7 (calc) (ref <5.0)
Triglycerides: 133 mg/dL (ref <150)

## 2020-01-08 LAB — MICROALBUMIN / CREATININE URINE RATIO
Creatinine, Urine: 284 mg/dL — ABNORMAL HIGH (ref 20–275)
Microalb Creat Ratio: 4 mcg/mg creat (ref <30)
Microalb, Ur: 1.1 mg/dL

## 2020-01-08 LAB — HEPATITIS C ANTIBODY
Hepatitis C Ab: NONREACTIVE
SIGNAL TO CUT-OFF: 0.01 (ref <1.00)

## 2020-01-08 LAB — TSH: TSH: 4.69 mIU/L — ABNORMAL HIGH

## 2020-01-08 MED ORDER — PREGABALIN 100 MG PO CAPS: 100.0000 mg | ORAL_CAPSULE | Freq: Three times a day (TID) | ORAL | 0 refills | Status: DC

## 2020-01-13 ENCOUNTER — Other Ambulatory Visit: Payer: Self-pay | Admitting: Family Medicine

## 2020-01-13 DIAGNOSIS — E069 Thyroiditis, unspecified: Secondary | ICD-10-CM

## 2020-03-07 ENCOUNTER — Encounter: Payer: Self-pay | Admitting: Family Medicine

## 2020-03-14 ENCOUNTER — Encounter: Payer: Self-pay | Admitting: Cardiology

## 2020-03-14 ENCOUNTER — Ambulatory Visit (INDEPENDENT_AMBULATORY_CARE_PROVIDER_SITE_OTHER): Payer: 59 | Admitting: Cardiology

## 2020-03-14 ENCOUNTER — Other Ambulatory Visit: Payer: Self-pay

## 2020-03-14 VITALS — BP 111/79 | HR 70 | Ht 61.0 in | Wt 182.0 lb

## 2020-03-14 DIAGNOSIS — E7800 Pure hypercholesterolemia, unspecified: Secondary | ICD-10-CM | POA: Diagnosis not present

## 2020-03-14 DIAGNOSIS — I2510 Atherosclerotic heart disease of native coronary artery without angina pectoris: Secondary | ICD-10-CM | POA: Diagnosis not present

## 2020-03-14 MED ORDER — ASPIRIN EC 81 MG PO TBEC: 81.0000 mg | DELAYED_RELEASE_TABLET | Freq: Every day | ORAL | 3 refills | Status: AC

## 2020-03-14 MED ORDER — ATORVASTATIN CALCIUM 40 MG PO TABS: 40.0000 mg | ORAL_TABLET | Freq: Every day | ORAL | 11 refills | Status: DC

## 2020-03-14 NOTE — Patient Instructions (Signed)
Medication Instructions:   Your physician has recommended you make the following change in your medication:   INCREASE your Lipitor to 40 MG daily.  *If you need a refill on your cardiac medications before your next appointment, please call your pharmacy*   Lab Work:  Your physician recommends that you return for a FASTING lipid profile: a few days prior to your 6 month follow up visit.  - You will need to be fasting. Please do not have anything to eat or drink after midnight the morning you have the lab work. You may only have water or black coffee with no cream or sugar.  - [facility] You will check in at the front desk to the right as you walk into the atrium. Valet Parking is offered if needed. - No appointment needed. You may go any day between 7 am and 6 pm.  Testing/Procedures: None Ordered   Follow-Up: At [facility], you and your health needs are our priority.  As part of our continuing mission to provide you with exceptional heart care, we have created designated Provider Care Teams.  These Care Teams include your primary Cardiologist (physician) and Advanced Practice Providers (APPs -  Physician Assistants and Nurse Practitioners) who all work together to provide you with the care you need, when you need it.  We recommend signing up for the patient portal called "MyChart".  Sign up information is provided on this After Visit Summary.  MyChart is used to connect with patients for Virtual Visits (Telemedicine).  Patients are able to view lab/test results, encounter notes, upcoming appointments, etc.  Non-urgent messages can be sent to your provider as well.   To learn more about what you can do with MyChart, go to [email or web address].    Your next appointment:   6 month(s)  The format for your next appointment:   In Person  Provider:   Kate Sable, MD   Other Instructions

## 2020-03-14 NOTE — Progress Notes (Signed)
Cardiology Office Note:    Date:  03/14/2020   ID:  Claire [name], DOB [birth year], MRN [id number]  PCP:  Steele Sizer, MD  [facility] Cardiologist:  Kate Sable, MD  [place] Electrophysiologist:  None   Referring MD: Steele Sizer, MD   Chief Complaint  Patient presents with  . Follow-up    6 month F/U    History of Present Illness:    Claire [name] is a 50 y.o. female with a hx of CAD (non obstructive 25%LAD), diabetes, depression, hyperlipidemia, family history of CAD who presents for follow-up.    Previously seen for nonobstructive CAD, hyperlipidemia.  Lipitor started, low-cholesterol diet advised.  She states gaining some weight over the past several months due to the pandemic.  Recently had reflux surgery, which has influenced her diet.  She is working on getting into good exercise regimen.  Denies chest pain.  Prior notes  echocardiogram on 07/2019 showed normal systolic and diastolic function, EF 60 to 65%.  Coronary CTA 08/2019 calcium score 175, mild LAD disease 25%.   Past Medical History:  Diagnosis Date  . Anemia   . Anxiety   . Arthritis    hands, hips  . Asthma   . Bilateral leg cramps   . Depression   . Diabetes mellitus without complication ([place])    history no longer a problem since weight loss surgery  . DJD (degenerative joint disease)   . GERD (gastroesophageal reflux disease)   . Headache    Migraines  . IBS (irritable bowel syndrome)   . Kidney stone   . Low serum vitamin B12   . Lung nodules   . Migraine    down to approx 4x/mo since starting meds  . Muscle spasm   . Psoriasis    vaginal area  . Seasonal allergies   . Sleep apnea    history no longer a problem since weight loss surgery  . Vasovagal syncope    [name], Dr. Alveria Apley   . Vitamin D deficiency     Past Surgical History:  Procedure Laterality Date  . BLADDER SURGERY  2010  . BREAST BIOPSY Right 2014   stereotatic biopsy  . CHOLECYSTECTOMY   2010  . COLONOSCOPY  2014    Done at [facility]  . COLONOSCOPY WITH PROPOFOL N/A 01/28/2018   Procedure: COLONOSCOPY WITH PROPOFOL;  Surgeon: Virgel Manifold, MD;  Location: [place];  Service: Endoscopy;  Laterality: N/A;  . DILITATION & CURRETTAGE/HYSTROSCOPY WITH NOVASURE ABLATION N/A 06/16/2015   Procedure: DILATATION & CURETTAGE/HYSTEROSCOPY WITH NOVASURE ABLATION;  Surgeon: Brien Few, MD;  Location: [facility];  Service: Gynecology;  Laterality: N/A;  . ESOPHAGOGASTRODUODENOSCOPY (EGD) WITH PROPOFOL N/A 01/28/2018   Procedure: ESOPHAGOGASTRODUODENOSCOPY (EGD) WITH BIOPSIES;  Surgeon: Virgel Manifold, MD;  Location: [place];  Service: Endoscopy;  Laterality: N/A;  . 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 HIATAL HERNIA REPAIR N/A 11/03/2019   Procedure: HERNIA REPAIR HIATAL;  Surgeon: Johnathan Hausen, MD;  Location: [facility];  Service: General;  Laterality: N/A;  . LAPAROSCOPIC GASTRIC SLEEVE RESECTION  2012  . LAPAROSCOPY N/A 06/16/2015   Procedure: LAPAROSCOPY DIAGNOSTIC;  Surgeon: Brien Few, MD;  Location: [facility];  Service: Gynecology;  Laterality: N/A;  . LAPAROSCOPY N/A 04/01/2018   Procedure: LAPAROSCOPY DIAGNOSTIC ERAS PATHWAY ENTEROLYSIS, CECOPEXY;  Surgeon: Johnathan Hausen, MD;  Location: [facility];  Service: General;  Laterality: N/A;  . LITHOTRIPSY  12/11/2017   laser lithotripsy  . LYSIS OF ADHESION N/A 06/16/2015   Procedure: LYSIS OF ADHESION;  Surgeon: Brien Few, MD;  Location: [facility];  Service: Gynecology;  Laterality: N/A;  . PLANTAR FASCIA SURGERY Bilateral   . ROBOTIC ASSISTED SALPINGO OOPHERECTOMY Right 06/16/2015   Procedure: ROBOTIC ASSISTED SALPINGO OOPHORECTOMY, EXCISION OF RIGHT CUL DE [place] MASS;  Surgeon: Brien Few, MD;  Location: [facility]  [facility];  Service: Gynecology;  Laterality: Right;  . TONSILLECTOMY    . UPPER GI ENDOSCOPY      Current Medications: Current Meds  Medication Sig  . Accu-Chek FastClix Lancets MISC USE TO TEST UP TO 4 TIMES A DAY  . ACCU-CHEK GUIDE test strip   . ADDERALL XR 30 MG 24 hr capsule Take 30 mg by mouth daily.   Marland Kitchen albuterol (VENTOLIN HFA) 108 (90 Base) MCG/ACT inhaler TAKE 2 PUFFS EVERY 6 HOURS AS NEEDED FOR WHEEZING OR SHORTNESS OF BREATH. (VENTOLIN NOT COVERED)  . aspirin EC 81 MG tablet Take 1 tablet (81 mg total) by mouth daily. Swallow whole.  . B-D 3CC LUER-LOK SYR 25GX1" 25G X 1" 3 ML MISC   . calcium citrate-vitamin D (CALCIUM CITRATE CHEWY BITE) 500-500 MG-UNIT chewable tablet Chew 1 tablet by mouth 3 (three) times daily.  . clobetasol ointment (TEMOVATE) 9.52 % Apply 1 application topically daily as needed (psoriasis).   . cyanocobalamin (,VITAMIN B-12,) 1000 MCG/ML injection INJECT 1 ML (1,000 MCG TOTAL) INTO THE MUSCLE EVERY 14 (FOURTEEN) DAYS.  Marland Kitchen cyclobenzaprine (FLEXERIL) 10 MG tablet Take 10 mg by mouth at bedtime as needed for muscle spasms.  . fluocinonide (LIDEX) 0.05 % external solution Apply 1 application topically 2 (two) times daily as needed (psoriasis).   Marland Kitchen gentamicin cream (GARAMYCIN) 0.1 % Apply 1 application topically 2 (two) times daily.  Marland Kitchen IRON-VITAMIN C PO Take 1 tablet by mouth daily.  Marland Kitchen ketoconazole (NIZORAL) 2 % shampoo Apply 1 application topically 2 (two) times daily as needed (psoriasis).   . Lactobacillus (ACIDOPHILUS PO) Take 1 capsule by mouth daily. Probiotic plus Calcium  . loratadine (CLARITIN) 10 MG tablet Take 10 mg by mouth daily.  . 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   . mometasone (NASONEX) 50 MCG/ACT nasal spray USE 2 SPRAYS IN EACH NOSTRIL AT BEDTIME AS NEEDED FOR ALLERGIES (Patient taking differently: Place 2 sprays into the nose daily.)  . montelukast (SINGULAIR) 10 MG tablet TAKE  1 TABLET (10 MG TOTAL) BY MOUTH AT BEDTIME. TAKE 1 TABLET(10 MG) BY MOUTH AT BEDTIME  . Multiple Vitamins-Minerals (MULTIVITAMIN ADULT) CHEW Chew 1 tablet by mouth daily.  . ondansetron (ZOFRAN) 4 MG tablet Take by mouth.  . pantoprazole (PROTONIX) 40 MG tablet Take 40 mg by mouth daily.  . pregabalin (LYRICA) 100 MG capsule Take 1 capsule (100 mg total) by mouth 3 (three) times daily.  . promethazine (PHENERGAN) 12.5 MG tablet TAKE 1 TABLET BY MOUTH EVERY 6 HOURS AS NEEDED FOR MIGRAINES WITH NAUSEA  . traZODone (DESYREL) 100 MG tablet Take 200 mg by mouth at bedtime.   . TRINTELLIX 20 MG TABS tablet Take 20 mg by mouth daily.   . Vitamin D, Ergocalciferol, (DRISDOL) 1.25 MG (50000 UNIT) CAPS capsule Take 1 capsule (50,000 Units total) by mouth every Saturday.  . zonisamide (ZONEGRAN) 50 MG capsule Take 150 mg by mouth daily.   . [  DISCONTINUED] atorvastatin (LIPITOR) 20 MG tablet Take 1 tablet (20 mg total) by mouth daily.     Allergies:   Aspartame and phenylalanine, Gabapentin, Linzess [linaclotide], Other, Adhesive [tape], Cymbalta [duloxetine hcl], and Tapentadol   Social History   Socioeconomic History  . Marital status: Married    Spouse name: Merrilee Seashore   . Number of children: 3  . Years of education: Not on file  . Highest education level: Some college, no degree  Occupational History  . Not on file  Tobacco Use  . Smoking status: Former Smoker    Packs/day: 1.00    Years: 10.00    Pack years: 10.00    Types: Cigarettes    Quit date: 02/19/1993    Years since quitting: 27.0  . Smokeless tobacco: Never Used  Vaping Use  . Vaping Use: Never used  Substance and Sexual Activity  . Alcohol use: Yes    Alcohol/week: 0.0 standard drinks    Comment: occasionally  . Drug use: No  . Sexual activity: Yes    Partners: Male    Comment: last sex 04 Jan 2018  Other Topics Concern  . Not on file  Social History Narrative  . Not on file   Social Determinants of Health   Financial  Resource Strain: Not on file  Food Insecurity: Not on file  Transportation Needs: Not on file  Physical Activity: Not on file  Stress: Not on file  Social Connections: Not on file     Family History: The patient's family history includes Breast cancer in her paternal aunt and paternal grandmother; Heart attack in her maternal uncle, mother, paternal grandfather, and sister; Hyperlipidemia in her father; Lung cancer in her maternal grandfather; Multiple myeloma in her mother; Stroke in her mother.  ROS:   Please see the history of present illness.     All other systems reviewed and are negative.  EKGs/Labs/Other Studies Reviewed:    The following studies were reviewed today:   EKG:  EKG is  ordered today.  The ekg ordered today demonstrates normal sinus rhythm, normal ECG.  Recent Labs: 11/03/2019: ALT 23 11/04/2019: BUN <5; Creatinine, Ser 0.62; Hemoglobin 12.0; Magnesium 1.8; Platelets 276; Potassium 3.8; Sodium 135 01/07/2020: TSH 4.69  Recent Lipid Panel    Component Value Date/Time   CHOL 191 01/07/2020 1231   CHOL 179 12/31/2018 0857   TRIG 133 01/07/2020 1231   HDL 51 01/07/2020 1231   HDL 64 12/31/2018 0857   CHOLHDL 3.7 01/07/2020 1231   VLDL 20 11/11/2015 0846   LDLCALC 115 (H) 01/07/2020 1231    Physical Exam:    VS:  BP 111/79 (BP Location: Left Arm, Patient Position: Sitting, Cuff Size: Normal)   Pulse 70   Ht 5' 1"  (1.549 m)   Wt 182 lb (82.6 kg)   SpO2 98%   BMI 34.39 kg/m     Wt Readings from Last 3 Encounters:  03/14/20 182 lb (82.6 kg)  01/07/20 180 lb 12.8 oz (82 kg)  01/04/20 183 lb 9.6 oz (83.3 kg)     GEN:  Well nourished, well developed in no acute distress HEENT: Normal NECK: No JVD; No carotid bruits LYMPHATICS: No lymphadenopathy CARDIAC: RRR, no murmurs, rubs, gallops RESPIRATORY:  Clear to auscultation without rales, wheezing or rhonchi  ABDOMEN: Soft, non-tender, non-distended MUSCULOSKELETAL:  No edema; No deformity  SKIN: Warm  and dry NEUROLOGIC:  Alert and oriented x 3 PSYCHIATRIC:  Normal affect   ASSESSMENT:    1. Coronary  artery disease involving native coronary artery of native heart without angina pectoris   2. Pure hypercholesterolemia    PLAN:    In order of problems listed above:  1. Nonobstructive CAD, coronary CTA showed a calcium score of 175, mild stenosis in LAD 25%.  Continue aspirin, increase Lipitor to 40 mg as below 2. History of hyperlipidemia, last lipid panel, LDL not at goal. increase Lipitor to 40 mg daily.  Repeat fasting lipid profile prior to follow-up visit  Follow-up 6 months  Total encounter time 35 minutes  Greater than 50% was spent in counseling and coordination of care with the patient  Medication Adjustments/Labs and Tests Ordered: Current medicines are reviewed at length with the patient today.  Concerns regarding medicines are outlined above.  Orders Placed This Encounter  Procedures  . Lipid panel  . EKG 12-Lead   Meds ordered this encounter  Medications  . aspirin EC 81 MG tablet    Sig: Take 1 tablet (81 mg total) by mouth daily. Swallow whole.    Dispense:  90 tablet    Refill:  3  . atorvastatin (LIPITOR) 40 MG tablet    Sig: Take 1 tablet (40 mg total) by mouth daily.    Dispense:  30 tablet    Refill:  11    Dose increase    Patient Instructions  Medication Instructions:   Your physician has recommended you make the following change in your medication:   INCREASE your Lipitor to 40 MG daily.  *If you need a refill on your cardiac medications before your next appointment, please call your pharmacy*   Lab Work:  Your physician recommends that you return for a FASTING lipid profile: a few days prior to your 6 month follow up visit.  - You will need to be fasting. Please do not have anything to eat or drink after midnight the morning you have the lab work. You may only have water or black coffee with no cream or sugar.  - Please go to the [facility]. You will check in at the front desk to the right as you walk into the atrium. Valet Parking is offered if needed. - No appointment needed. You may go any day between 7 am and 6 pm.  Testing/Procedures: None Ordered   Follow-Up: At [facility], you and your health needs are our priority.  As part of our continuing mission to provide you with exceptional heart care, we have created designated Provider Care Teams.  These Care Teams include your primary Cardiologist (physician) and Advanced Practice Providers (APPs -  Physician Assistants and Nurse Practitioners) who all work together to provide you with the care you need, when you need it.  We recommend signing up for the patient portal called "MyChart".  Sign up information is provided on this After Visit Summary.  MyChart is used to connect with patients for Virtual Visits (Telemedicine).  Patients are able to view lab/test results, encounter notes, upcoming appointments, etc.  Non-urgent messages can be sent to your provider as well.   To learn more about what you can do with MyChart, go to [email or web address].    Your next appointment:   6 month(s)  The format for your next appointment:   In Person  Provider:   Kate Sable, MD   Other Instructions      Signed, Kate Sable, MD  03/14/2020 1:11 PM    [place]

## 2020-03-18 ENCOUNTER — Other Ambulatory Visit: Payer: Self-pay | Admitting: Gastroenterology

## 2020-03-18 DIAGNOSIS — K219 Gastro-esophageal reflux disease without esophagitis: Secondary | ICD-10-CM

## 2020-03-18 NOTE — Telephone Encounter (Signed)
Last office visit 04/08/2019 IBS GERD  Last refill 04/08/2019 2 refills D/C  Follow up with Dr. Bonna Gains in 1 month but never did

## 2020-03-31 ENCOUNTER — Encounter: Payer: Self-pay | Admitting: Family Medicine

## 2020-04-29 ENCOUNTER — Encounter: Payer: Self-pay | Admitting: Dietician

## 2020-04-29 ENCOUNTER — Encounter: Payer: PRIVATE HEALTH INSURANCE | Attending: Surgery | Admitting: Dietician

## 2020-04-29 ENCOUNTER — Other Ambulatory Visit: Payer: Self-pay

## 2020-04-29 VITALS — Ht 61.0 in | Wt 178.8 lb

## 2020-04-29 DIAGNOSIS — K449 Diaphragmatic hernia without obstruction or gangrene: Secondary | ICD-10-CM | POA: Diagnosis not present

## 2020-04-29 DIAGNOSIS — R109 Unspecified abdominal pain: Secondary | ICD-10-CM | POA: Diagnosis not present

## 2020-04-29 DIAGNOSIS — Z903 Acquired absence of stomach [part of]: Secondary | ICD-10-CM | POA: Diagnosis present

## 2020-04-29 DIAGNOSIS — E669 Obesity, unspecified: Secondary | ICD-10-CM

## 2020-04-29 NOTE — Patient Instructions (Signed)
   Set alarms for taking vitamin and calcium supplements to resume a regular schedule.   Gradually increase daily activity/ steps to maintain or increase muscle weight.   Continue to focus on lean protein foods and low carb veggies. OK to add small amounts of low sugar fruit as tolerated.

## 2020-04-29 NOTE — Progress Notes (Signed)
Nutrition Therapy for Post-Operative Bariatric Diet Follow-up visit:  6 months post-op Sleeve Gastrectomy Surgery  Medical Nutrition Therapy:  Appt start time: 1430 end time:  8938.  Anthropometrics: Weight: 178.8lbs Height: 5'1"  Date 08/07/19 11/16/19 01/04/20 04/29/20  BMI 36 35.3 34.7 33.8  Weight (lbs) 190.4 186.6 183.6 178.8  Skeletal muscle (lbs) 50.3 48.5 48.5 47.0  % body fat 51.2 51.8 50.5 51.0   Clinical: Medications: reconciled list in medical record; patient reports inconsistency with taking meds recently Supplementation: bariatric multivitamin 1-2x daily; calcium citrate 0-3x daily Health/ medical history changes: no changes GI symptoms: N/V/D/C: occasional vomiting/ regurgitation/ gas  Dumping Syndrome: no Hair loss: ?  Dietary/ Lifestyle Progress: . Patient has experienced job loss in the past 3 months; this along with ongoing episodes of GI distress/ difficulty eating has contributed to current depressed feelings. . Depression has led to significantly less physical activity most days and some additional snacking. Marland Kitchen Has been snacking on some crackers ie Triscuits, doritos, Now tried parmesan crisps which are low carb high protein . Ability to eat some other foods has now improved, but still needs very moist meats; reheated foods become too dry and get stuck in esophagus . Likes "[place] lean" shakes and plans to keep them on hand along with protein bars for upcoming vacation. . Trying to have most meals at home and fewer restaurant meals.  Dietary recall: Eating pattern: 3 meals and 2-3 snacks Dining out: several meals weekly  Breakfast: scrambled eggs (sometimes unable to eat eggs); 1 pc bacon + green beans or squash; [place] and cheese rollup; sometimes skips if sleeping late Snack: yogurt; quest protein bar Lunch: 11am if no breakfast; 2oz chicken salad (homemade); chicken quesadilla 10-15g; homemade soup Snack: pistachios occasionally  Dinner: 2 small wedges  [place];  3/10 meat, artichoke (then had GI distress); 3oz grilled chicken; salmon; lobster; tuna sashimi Snack: none or same as am/ pm  Fluid intake: >64oz daily per patient Estimated total protein intake: 50-65g Bariatric diet adherence:  . Using straws: no . Drinking fluids during meals: no . Carbonated beverages: no  Recent physical activity:  Low activity level due to depression   Nutrition Intervention:   . Reviewed progress since previous visit. . Gradual weight loss continues despite increased stress and less activity. . Making significant or numerous changes at this time would be overwhelming for patient in depressed state. She is hopeful that resuming antidepressant medication will help her resume healthy eating pattern and regular exercise. . Established nutrition goals with input from patient.  Nutritional Diagnosis:  [place]-3.3 Overweight/obesity As related to history of overweight and bariatric surgical procedures.  As evidenced by patient with current BMI of 33.78, working to maintain bariatric eating pattern for ongoing weight loss after RNY revision.  Teaching Method Utilized:  Visual Auditory Hands on  Materials provided:  Visit summary with goals/ instructions  Learning Readiness:   Change in progress  Barriers to learning/adherence to lifestyle change: none  Demonstrated degree of understanding via:  Teach Back      Plan: . Implement goals as listed in Patient Instructions. . Return for 9 month post-op visit on 08/03/20 at 4:00pm

## 2020-05-05 ENCOUNTER — Other Ambulatory Visit: Payer: Self-pay | Admitting: Family Medicine

## 2020-05-05 DIAGNOSIS — E559 Vitamin D deficiency, unspecified: Secondary | ICD-10-CM

## 2020-05-05 NOTE — Telephone Encounter (Signed)
Requested medications are due for refill today.  yes  Requested medications are on the active medications list.  yes  Last refill. 11/07/2019  Future visit scheduled.   yes  Notes to clinic.  Medication not delegated.

## 2020-05-30 ENCOUNTER — Other Ambulatory Visit: Payer: Self-pay | Admitting: Family Medicine

## 2020-05-30 DIAGNOSIS — J4520 Mild intermittent asthma, uncomplicated: Secondary | ICD-10-CM

## 2020-05-30 DIAGNOSIS — N761 Subacute and chronic vaginitis: Secondary | ICD-10-CM

## 2020-06-03 ENCOUNTER — Encounter: Payer: Self-pay | Admitting: Family Medicine

## 2020-06-14 ENCOUNTER — Other Ambulatory Visit: Payer: Self-pay | Admitting: Family Medicine

## 2020-06-14 DIAGNOSIS — R221 Localized swelling, mass and lump, neck: Secondary | ICD-10-CM

## 2020-06-17 ENCOUNTER — Encounter: Payer: Self-pay | Admitting: Family Medicine

## 2020-06-20 ENCOUNTER — Other Ambulatory Visit: Payer: Self-pay

## 2020-06-20 ENCOUNTER — Encounter: Payer: Self-pay | Admitting: Family Medicine

## 2020-06-20 ENCOUNTER — Ambulatory Visit (INDEPENDENT_AMBULATORY_CARE_PROVIDER_SITE_OTHER): Payer: PRIVATE HEALTH INSURANCE | Admitting: Family Medicine

## 2020-06-20 VITALS — BP 126/80 | HR 86 | Temp 98.2°F | Resp 16 | Ht 61.0 in | Wt 184.0 lb

## 2020-06-20 DIAGNOSIS — R3911 Hesitancy of micturition: Secondary | ICD-10-CM

## 2020-06-20 DIAGNOSIS — R300 Dysuria: Secondary | ICD-10-CM | POA: Diagnosis not present

## 2020-06-20 LAB — POCT URINALYSIS DIPSTICK
Bilirubin, UA: NEGATIVE
Blood, UA: NEGATIVE
Glucose, UA: NEGATIVE
Ketones, UA: NEGATIVE
Leukocytes, UA: NEGATIVE
Nitrite, UA: NEGATIVE
Protein, UA: NEGATIVE
Spec Grav, UA: 1.015 (ref 1.010–1.025)
Urobilinogen, UA: 0.2 E.U./dL
pH, UA: 6 (ref 5.0–8.0)

## 2020-06-20 MED ORDER — CIPROFLOXACIN HCL 250 MG PO TABS: 250.0000 mg | ORAL_TABLET | Freq: Two times a day (BID) | ORAL | 0 refills | Status: AC

## 2020-06-20 NOTE — Progress Notes (Addendum)
Name: Claire [name]   MRN: [id number]    DOB: 1970-06-16   Date:06/20/2020       Progress Note  Subjective  Chief Complaint  UTI  HPI  Dysuria: she noticed burning when voiding - during micturition also urinary frequency, symptoms started about one week ago. A few days ago she noticed hesitancy, did a home test three days ago and it was positive, last night had nocturia times 3 times and chills last night . She denies fever, nausea, vomiting, no change in bowel movements or flank pain.   Patient Active Problem List   Diagnosis Date Noted  . 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/03/2019  . B12 deficiency 04/10/2018  . Columnar epithelial-lined lower esophagus   . Postprocedural disorder of digestive system   . Pain, abdominal, epigastric   . Esophageal dysphagia   . Generalized abdominal pain   . Persistent mood (affective) disorder, unspecified ([place]) 01/15/2018  . Insomnia related to another mental disorder 01/15/2018  . OCD (obsessive compulsive disorder) 10/29/2017  . GAD (generalized anxiety disorder) 10/29/2017  . Nephrolithiasis 10/22/2017  . Vasovagal syncope 09/05/2017  . Atherosclerosis of aorta ([place]) 12/21/2016  . Hiatal hernia 08/17/2016  . Diverticulosis of colon 08/17/2016  . Iron deficiency anemia 08/08/2015  . Lumbosacral pain 01/07/2015  . Muscle twitching 01/07/2015  . Intertrigo 11/05/2014  . Perennial allergic rhinitis 09/03/2014  . Lung nodules 09/03/2014  . Vitamin D deficiency 09/03/2014  . Diet-controlled diabetes mellitus ([place]) 09/03/2014  . History of kidney stones 09/03/2014  . Chronic insomnia 09/03/2014  . History of iron deficiency anemia 09/03/2014  . Depression with anxiety 09/03/2014  . Palpitations 06/22/2014  . Bariatric surgery status 12/03/2013  . Asthma, mild persistent 11/21/2012  . CN (constipation) 11/21/2012  . GERD without  esophagitis 11/21/2012  . History of hyperlipidemia 11/21/2012  . Migraine with aura and without status migrainosus 11/21/2012  . History of sleep apnea 11/21/2012  . IBS (irritable bowel syndrome) 08/15/2012    Past Surgical History:  Procedure Laterality Date  . BLADDER SURGERY  2010  . BREAST BIOPSY Right 2014   stereotatic biopsy  . CHOLECYSTECTOMY  2010  . COLONOSCOPY  2014    Done at [facility]  . COLONOSCOPY WITH PROPOFOL N/A 01/28/2018   Procedure: COLONOSCOPY WITH PROPOFOL;  Surgeon: Virgel Manifold, MD;  Location: [facility];  Service: Endoscopy;  Laterality: N/A;  . DILITATION & CURRETTAGE/HYSTROSCOPY WITH NOVASURE ABLATION N/A 06/16/2015   Procedure: DILATATION & CURETTAGE/HYSTEROSCOPY WITH NOVASURE ABLATION;  Surgeon: Brien Few, MD;  Location: [facility];  Service: Gynecology;  Laterality: N/A;  . ESOPHAGOGASTRODUODENOSCOPY (EGD) WITH PROPOFOL N/A 01/28/2018   Procedure: ESOPHAGOGASTRODUODENOSCOPY (EGD) WITH BIOPSIES;  Surgeon: Virgel Manifold, MD;  Location: [facility];  Service: Endoscopy;  Laterality: N/A;  . 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 HIATAL HERNIA REPAIR N/A 11/03/2019   Procedure: HERNIA REPAIR HIATAL;  Surgeon: Johnathan Hausen, MD;  Location: [facility];  Service: General;  Laterality: N/A;  . LAPAROSCOPIC GASTRIC SLEEVE RESECTION  2012  . LAPAROSCOPY N/A 06/16/2015   Procedure: LAPAROSCOPY DIAGNOSTIC;  Surgeon: Brien Few, MD;  Location: [facility];  Service: Gynecology;  Laterality: N/A;  . LAPAROSCOPY N/A 04/01/2018   Procedure:  LAPAROSCOPY DIAGNOSTIC ERAS PATHWAY ENTEROLYSIS, CECOPEXY;  Surgeon: Johnathan Hausen, MD;  Location: [facility];  Service: General;  Laterality: N/A;  . LITHOTRIPSY  12/11/2017   laser lithotripsy  . LYSIS OF  ADHESION N/A 06/16/2015   Procedure: LYSIS OF ADHESION;  Surgeon: Brien Few, MD;  Location: [facility];  Service: Gynecology;  Laterality: N/A;  . PLANTAR FASCIA SURGERY Bilateral   . ROBOTIC ASSISTED SALPINGO OOPHERECTOMY Right 06/16/2015   Procedure: ROBOTIC ASSISTED SALPINGO OOPHORECTOMY, EXCISION OF RIGHT CUL DE [place] MASS;  Surgeon: Brien Few, MD;  Location: [facility];  Service: Gynecology;  Laterality: Right;  . TONSILLECTOMY    . UPPER GI ENDOSCOPY      Family History  Problem Relation Age of Onset  . Heart attack Mother   . Stroke Mother   . Multiple myeloma Mother   . Hyperlipidemia Father   . Heart attack Sister   . Heart attack Maternal Uncle   . Heart attack Paternal Grandfather   . Breast cancer Paternal Aunt   . Breast cancer Paternal Grandmother   . Lung cancer Maternal Grandfather     Social History   Tobacco Use  . Smoking status: Former Smoker    Packs/day: 1.00    Years: 10.00    Pack years: 10.00    Types: Cigarettes    Quit date: 02/19/1993    Years since quitting: 27.3  . Smokeless tobacco: Never Used  Substance Use Topics  . Alcohol use: Yes    Alcohol/week: 0.0 standard drinks    Comment: occasionally     Current Outpatient Medications:  .  Accu-Chek FastClix Lancets MISC, USE TO TEST UP TO 4 TIMES A DAY, Disp: , Rfl:  .  ACCU-CHEK GUIDE test strip, , Disp: , Rfl:  .  ADDERALL XR 30 MG 24 hr capsule, Take 30 mg by mouth daily. , Disp: , Rfl:  .  albuterol (VENTOLIN HFA) 108 (90 Base) MCG/ACT inhaler, TAKE 2 PUFFS EVERY 6 HOURS AS NEEDED FOR WHEEZING OR SHORTNESS OF BREATH. (VENTOLIN NOT COVERED), Disp: 8.5 each, Rfl: 2 .  aspirin EC 81 MG tablet, Take 1 tablet (81 mg total) by mouth daily. Swallow whole., Disp: 90 tablet, Rfl: 3 .  B-D 3CC LUER-LOK SYR 25GX1" 25G X 1" 3 ML MISC, , Disp: , Rfl:  .  calcium citrate-vitamin D (CALCIUM CITRATE CHEWY BITE) 500-500 MG-UNIT chewable tablet, Chew 1 tablet by mouth 3 (three) times daily., Disp: , Rfl:  .   clobetasol ointment (TEMOVATE) 2.19 %, Apply 1 application topically daily as needed (psoriasis). , Disp: , Rfl: 0 .  cyanocobalamin (,VITAMIN B-12,) 1000 MCG/ML injection, INJECT 1 ML (1,000 MCG TOTAL) INTO THE MUSCLE EVERY 14 (FOURTEEN) DAYS., Disp: 6 mL, Rfl: 0 .  cyclobenzaprine (FLEXERIL) 10 MG tablet, Take 10 mg by mouth at bedtime as needed for muscle spasms., Disp: , Rfl:  .  fluocinonide (LIDEX) 0.05 % external solution, Apply 1 application topically 2 (two) times daily as needed (psoriasis). , Disp: , Rfl:  .  gentamicin cream (GARAMYCIN) 0.1 %, Apply 1 application topically 2 (two) times daily., Disp: 30 g, Rfl: 1 .  IRON-VITAMIN C PO, Take 1 tablet by mouth daily., Disp: , Rfl:  .  ketoconazole (NIZORAL) 2 % shampoo, Apply 1 application topically 2 (two) times daily as needed (psoriasis). , Disp: , Rfl:  .  Lactobacillus (ACIDOPHILUS PO), Take 1 capsule by mouth daily. Probiotic plus Calcium, Disp: , Rfl:  .  loratadine (CLARITIN) 10 MG tablet, Take  10 mg by mouth daily., Disp: , Rfl:  .  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 , Disp: , Rfl: 4 .  mometasone (NASONEX) 50 MCG/ACT nasal spray, USE 2 SPRAYS IN EACH NOSTRIL AT BEDTIME AS NEEDED FOR ALLERGIES (Patient taking differently: Place 2 sprays into the nose daily.), Disp: 17 g, Rfl: 1 .  montelukast (SINGULAIR) 10 MG tablet, TAKE 1 TABLET (10 MG TOTAL) BY MOUTH AT BEDTIME. TAKE 1 TABLET(10 MG) BY MOUTH AT BEDTIME, Disp: 90 tablet, Rfl: 3 .  Multiple Vitamins-Minerals (MULTIVITAMIN ADULT) CHEW, Chew 1 tablet by mouth daily., Disp: , Rfl:  .  ondansetron (ZOFRAN) 4 MG tablet, Take by mouth., Disp: , Rfl:  .  pantoprazole (PROTONIX) 40 MG tablet, Take 40 mg by mouth daily., Disp: , Rfl:  .  pregabalin (LYRICA) 100 MG capsule, Take 1 capsule (100 mg total) by mouth 3 (three) times daily., Disp: 270 capsule, Rfl: 0 .  promethazine (PHENERGAN) 12.5 MG tablet, TAKE 1 TABLET BY  MOUTH EVERY 6 HOURS AS NEEDED FOR MIGRAINES WITH NAUSEA, Disp: 20 tablet, Rfl: 0 .  traZODone (DESYREL) 100 MG tablet, Take 200 mg by mouth at bedtime. , Disp: , Rfl:  .  TRINTELLIX 20 MG TABS tablet, Take 20 mg by mouth daily. , Disp: , Rfl:  .  Vitamin D, Ergocalciferol, (DRISDOL) 1.25 MG (50000 UNIT) CAPS capsule, TAKE 1 CAPSULE (50,000 UNITS TOTAL) BY MOUTH EVERY SATURDAY., Disp: 12 capsule, Rfl: 1 .  zonisamide (ZONEGRAN) 50 MG capsule, Take 150 mg by mouth daily. , Disp: , Rfl: 2 .  atorvastatin (LIPITOR) 40 MG tablet, Take 1 tablet (40 mg total) by mouth daily., Disp: 30 tablet, Rfl: 11  Allergies  Allergen Reactions  . Aspartame And Phenylalanine Nausea Only and Other (See Comments)    GI upset   . Gabapentin     Bladder incontinence at higher dose  . Linzess [Linaclotide]     Worsening of diarrhea  . Other Hives and Other (See Comments)    Ricotta Cheese: flushed and hot  . Adhesive [Tape] Rash    Some clear tapes  . Cymbalta [Duloxetine Hcl] Palpitations    And photosensitivity  . Tapentadol Rash    Some clear tapes    I personally reviewed active problem list, medication list, allergies, family history, social history, health maintenance with the patient/caregiver today.   ROS  Constitutional: Negative for fever or weight change.  Respiratory: Negative for cough and shortness of breath.   Cardiovascular: Negative for chest pain or palpitations.  Gastrointestinal: Negative for abdominal pain, no bowel changes.  Musculoskeletal: Negative for gait problem or joint swelling.  Skin: Negative for rash.  Neurological: Negative for dizziness or headache.  No other specific complaints in a complete review of systems (except as listed in HPI above).  Objective  Vitals:   06/20/20 1547  BP: 126/80  Pulse: 86  Resp: 16  Temp: 98.2 F (36.8 C)  TempSrc: Oral  SpO2: 97%  Weight: 184 lb (83.5 kg)  Height: _0  (1.549 m)    Body mass index is 34.77 kg/m.  Physical  Exam  Constitutional: Patient appears well-developed and well-nourished.No distress.  HEENT: head atraumatic, normocephalic, pupils equal and reactive to light, neck supple Cardiovascular: Normal rate, regular rhythm and normal heart sounds.  No murmur heard. No BLE edema. Pulmonary/Chest: Effort normal and breath sounds normal. No respiratory distress. Abdominal: Soft.  There is no tenderness. Negative CVA  tenderness  Psychiatric: Patient has a normal mood and affect. behavior is normal. Judgment and thought content normal.  Recent Results (from the past 2160 hour(s))  POCT Urinalysis Dipstick     Status: None   Collection Time: 06/20/20  3:42 PM  Result Value Ref Range   Color, UA Yellow    Clarity, UA Clear    Glucose, UA Negative Negative   Bilirubin, UA Negative    Ketones, UA Negative    Spec Grav, UA 1.015 1.010 - 1.025   Blood, UA Negative    pH, UA 6.0 5.0 - 8.0   Protein, UA Negative Negative   Urobilinogen, UA 0.2 0.2 or 1.0 E.U./dL   Nitrite, UA Negative    Leukocytes, UA Negative Negative   Appearance     Odor       PHQ2/9: Depression screen [facility] 2/9 06/20/2020 01/07/2020 07/21/2019 07/15/2019 06/04/2019  Decreased Interest 1 0 0 0 0  Down, Depressed, Hopeless 0 1 0 0 0  PHQ - 2 Score 1 1 0 0 0  Altered sleeping 0 0 0 0 -  Tired, decreased energy 0 1 0 2 -  Change in appetite 0 0 0 0 -  Feeling bad or failure about yourself  0 0 0 0 -  Trouble concentrating 0 0 0 2 -  Moving slowly or fidgety/restless 0 0 0 0 -  Suicidal thoughts 0 0 0 0 -  PHQ-9 Score 1 2 0 4 -  Difficult doing work/chores - Not difficult at all Not difficult at all Somewhat difficult -  Some recent data might be hidden    phq 9 is positive   Fall Risk: Fall Risk  06/20/2020 04/29/2020 01/07/2020 08/07/2019 07/21/2019  Falls in the past year? 0 0 0 0 0  Number falls in past yr: 0 - 0 - -  Injury with Fall? 0 - 0 - -  Follow up - - - - -     Functional Status Survey: Is the patient deaf or  have difficulty hearing?: Yes Does the patient have difficulty seeing, even when wearing glasses/contacts?: No Does the patient have difficulty concentrating, remembering, or making decisions?: Yes Does the patient have difficulty walking or climbing stairs?: No Does the patient have difficulty dressing or bathing?: No Does the patient have difficulty doing errands alone such as visiting a doctor's office or shopping?: No    Assessment & Plan   1. Dysuria  - POCT Urinalysis Dipstick - CULTURE, URINE COMPREHENSIVE - ciprofloxacin (CIPRO) 250 MG tablet; Take 1 tablet (250 mg total) by mouth 2 (two) times daily for 3 days.  Dispense: 6 tablet; Refill: 0  2. Hesitancy of micturition  - CULTURE, URINE COMPREHENSIVE - ciprofloxacin (CIPRO) 250 MG tablet; Take 1 tablet (250 mg total) by mouth 2 (two) times daily for 3 days.  Dispense: 6 tablet; Refill: 0

## 2020-06-22 ENCOUNTER — Encounter: Payer: Self-pay | Admitting: Family Medicine

## 2020-06-22 LAB — CULTURE, URINE COMPREHENSIVE
MICRO NUMBER:: 11838563
SPECIMEN QUALITY:: ADEQUATE

## 2020-06-24 ENCOUNTER — Other Ambulatory Visit: Payer: Self-pay | Admitting: Family Medicine

## 2020-06-24 MED ORDER — SULFAMETHOXAZOLE-TRIMETHOPRIM 800-160 MG PO TABS: 1.0000 | ORAL_TABLET | Freq: Two times a day (BID) | ORAL | 0 refills | Status: DC

## 2020-06-28 ENCOUNTER — Other Ambulatory Visit: Payer: Self-pay | Admitting: Family Medicine

## 2020-06-28 DIAGNOSIS — J3089 Other allergic rhinitis: Secondary | ICD-10-CM

## 2020-06-28 NOTE — Telephone Encounter (Signed)
Last seen: 5.2.2022 Next sch'd: 5.18.2022

## 2020-06-28 NOTE — Telephone Encounter (Signed)
   Notes to clinic:  the script requested has expired  Review for continued use and refill    Requested Prescriptions  Pending Prescriptions Disp Refills   mometasone (NASONEX) 50 MCG/ACT nasal spray [Pharmacy Med Name: MOMETASONE FUROATE 50 MCG SPRY] 17 each 1    Sig: USE 2 SPRAYS IN EACH NOSTRIL AT BEDTIME AS NEEDED FOR ALLERGIES      Ear, Nose, and Throat: Nasal Preparations - Corticosteroids Passed - 06/28/2020 12:53 PM      Passed - Valid encounter within last 12 months    Recent Outpatient Visits           1 week ago [facility] Steele Sizer, MD   5 months ago Diet-controlled diabetes mellitus [facility])   [facility] Steele Sizer, MD   8 months ago Diet-controlled diabetes mellitus [facility])   [facility] Steele Sizer, MD   11 months ago Diet-controlled diabetes mellitus [facility])   [facility] Steele Sizer, MD   11 months ago History of COVID-19   [facility] Steele Sizer, MD       Future Appointments             In 1 week Steele Sizer, MD [facility], [facility]

## 2020-06-29 NOTE — Telephone Encounter (Signed)
Pt informed that prescription has been sent to pharmacy

## 2020-07-05 NOTE — Progress Notes (Signed)
Name: Claire [name]   MRN: [id number]    DOB: 02/25/1970   Date:07/06/2020       Progress Note  Subjective  Chief Complaint  Follow Up  HPI  Major Depression:she is now seeing Dr. Nicolasa Ducking who is also giving her therapy.  She is off Zolot and is currently on Trintelix 20 mg and off Lunesta and taking Trazodone for sleep and is sleeping well now .  She had side effects with Ambien ( interrupted sleep and sleep walking) , or Temazepam ( groggy ).She is following Dr. Waylan Boga recommendation. She has been unemployed but trying to find a job, but worried about her memory since COVID-19  , Dr. Nicolasa Ducking is giving her Adderal and seems to be helping with focus and concentration  . Asthma MildIntermittentt:she states doing well, no cough, wheezing or sob, very seldom uses rescue inhaler . Unchanged   Migraine: she is still seeing Dr. Domingo Cocking - she is on  Zonegran, also taking promethazine . She is doing better now, she has on average 1 episodes per month, she has tension headaches intermittent . Stable   DMII: diagnosed at age 81, took medication for a while, but stopped all medications 10/25/2010 after bariatric surgery,glucose at home has been well controlled, no polyphagia, polyuria or polydipsia. Maximum weight of 265 lbs and wasdown to 118.3lbs - lowest weight back in 08/2017),she was gaining weight since she started to feel better after umbilical hernia repair with  lysis of adhesions and cecopexy back in 04/01/2018, she had a bariatric surgery revision 11/03/2019 by Dr. Hassell Done, at that time weight was 196 lbs.Her weight is down to 182 lbs . She eats small portions  IBS/GERD history of bypass surgery:  She was doing well after  lysis of adhesions and cecopexy back in 04/01/2018.She still has intermittent vomiting and occasional dysphagia, usually something hard.    Atherosclerosis of aorta and hyperlipidemia: on statin therapy. Unchanged   B12 deficiency: shehas been getting B12  injections weekly since Nov and last level was at goal at 808. Continue supplementations.   Thyroiditis: she had a neck lump that decreased in size at the time T4 was high and TSH suppressed, however last level was normal 03/2020   Dizziness/syncope: seen at [facility], and has seen Dr. Margarito Courser 03/2019 , they ruled out POTS but may have had vasovagal syncope. Today her bp is very low, she states she is trying to get up slowly from her bed. Drinking plenty of fluids. She will follow up with cardiologist   History of COVID:  she still has mental fogginess, and feel tired , she states pulse ox has been above 94 % lately. She is no longer working and is trying to take online classes but having difficulty keeping up with the information.   FMS: she is still having pain, we adjusted her dose to Lyrica 100 mg TID but she usually takes it only BID. She states her pain has been worse over the past few months   Patient Active Problem List   Diagnosis Date Noted  . 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/03/2019  . B12 deficiency 04/10/2018  . Columnar epithelial-lined lower esophagus   . Postprocedural disorder of digestive system   . Pain, abdominal, epigastric   . Esophageal dysphagia   . Generalized abdominal pain   . Persistent mood (affective) disorder, unspecified ([place]) 01/15/2018  . Insomnia  related to another mental disorder 01/15/2018  . OCD (obsessive compulsive disorder) 10/29/2017  . GAD (generalized anxiety disorder) 10/29/2017  . Nephrolithiasis 10/22/2017  . Vasovagal syncope 09/05/2017  . Atherosclerosis of aorta ([place]) 12/21/2016  . Hiatal hernia 08/17/2016  . Diverticulosis of colon 08/17/2016  . Iron deficiency anemia 08/08/2015  . Lumbosacral pain 01/07/2015  . Muscle twitching 01/07/2015  . Intertrigo 11/05/2014  . Perennial allergic rhinitis 09/03/2014  . Lung nodules  09/03/2014  . Vitamin D deficiency 09/03/2014  . Diet-controlled diabetes mellitus ([place]) 09/03/2014  . History of kidney stones 09/03/2014  . Chronic insomnia 09/03/2014  . History of iron deficiency anemia 09/03/2014  . Depression with anxiety 09/03/2014  . Palpitations 06/22/2014  . Bariatric surgery status 12/03/2013  . Asthma, mild persistent 11/21/2012  . CN (constipation) 11/21/2012  . GERD without esophagitis 11/21/2012  . History of hyperlipidemia 11/21/2012  . Migraine with aura and without status migrainosus 11/21/2012  . History of sleep apnea 11/21/2012  . IBS (irritable bowel syndrome) 08/15/2012    Past Surgical History:  Procedure Laterality Date  . BLADDER SURGERY  2010  . BREAST BIOPSY Right 2014   stereotatic biopsy  . CHOLECYSTECTOMY  2010  . COLONOSCOPY  2014    [facility] at [facility]  . COLONOSCOPY WITH PROPOFOL N/A 01/28/2018   Procedure: COLONOSCOPY WITH PROPOFOL;  Surgeon: Virgel Manifold, MD;  Location: [place];  Service: Endoscopy;  Laterality: N/A;  . DILITATION & CURRETTAGE/HYSTROSCOPY WITH NOVASURE ABLATION N/A 06/16/2015   Procedure: DILATATION & CURETTAGE/HYSTEROSCOPY WITH NOVASURE ABLATION;  Surgeon: Brien Few, MD;  Location: [facility];  Service: Gynecology;  Laterality: N/A;  . ESOPHAGOGASTRODUODENOSCOPY (EGD) WITH PROPOFOL N/A 01/28/2018   Procedure: ESOPHAGOGASTRODUODENOSCOPY (EGD) WITH BIOPSIES;  Surgeon: Virgel Manifold, MD;  Location: [place];  Service: Endoscopy;  Laterality: N/A;  . 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 HIATAL HERNIA REPAIR N/A 11/03/2019   Procedure: HERNIA REPAIR HIATAL;  Surgeon: Johnathan Hausen, MD;  Location: [facility];  Service: General;  Laterality: N/A;  . LAPAROSCOPIC GASTRIC SLEEVE RESECTION   2012  . LAPAROSCOPY N/A 06/16/2015   Procedure: LAPAROSCOPY DIAGNOSTIC;  Surgeon: Brien Few, MD;  Location: [facility];  Service: Gynecology;  Laterality: N/A;  . LAPAROSCOPY N/A 04/01/2018   Procedure: LAPAROSCOPY DIAGNOSTIC ERAS PATHWAY ENTEROLYSIS, CECOPEXY;  Surgeon: Johnathan Hausen, MD;  Location: [facility];  Service: General;  Laterality: N/A;  . LITHOTRIPSY  12/11/2017   laser lithotripsy  . LYSIS OF ADHESION N/A 06/16/2015   Procedure: LYSIS OF ADHESION;  Surgeon: Brien Few, MD;  Location: [facility];  Service: Gynecology;  Laterality: N/A;  . PLANTAR FASCIA SURGERY Bilateral   . ROBOTIC ASSISTED SALPINGO OOPHERECTOMY Right 06/16/2015   Procedure: ROBOTIC ASSISTED SALPINGO OOPHORECTOMY, EXCISION OF RIGHT CUL DE [place] MASS;  Surgeon: Brien Few, MD;  Location: [facility];  Service: Gynecology;  Laterality: Right;  . TONSILLECTOMY    . UPPER GI ENDOSCOPY      Family History  Problem Relation Age of Onset  . Heart attack Mother   . Stroke Mother   . Multiple myeloma Mother   . Hyperlipidemia Father   . Heart attack Sister   . Heart attack Maternal Uncle   . Heart attack Paternal Grandfather   . Breast cancer Paternal Aunt   . Breast cancer Paternal Grandmother   .  Lung cancer Maternal Grandfather     Social History   Tobacco Use  . Smoking status: Former Smoker    Packs/day: 1.00    Years: 10.00    Pack years: 10.00    Types: Cigarettes    Quit date: 02/19/1993    Years since quitting: 27.3  . Smokeless tobacco: Never Used  Substance Use Topics  . Alcohol use: Yes    Alcohol/week: 0.0 standard drinks    Comment: occasionally     Current Outpatient Medications:  .  Accu-Chek FastClix Lancets MISC, USE TO TEST UP TO 4 TIMES A DAY, Disp: , Rfl:  .  ACCU-CHEK GUIDE test strip, , Disp: , Rfl:  .  ADDERALL XR 30 MG 24 hr capsule, Take 30 mg by mouth daily. , Disp: , Rfl:  .  albuterol (VENTOLIN HFA) 108 (90 Base) MCG/ACT inhaler, TAKE 2 PUFFS EVERY 6 HOURS AS NEEDED FOR  WHEEZING OR SHORTNESS OF BREATH. (VENTOLIN NOT COVERED), Disp: 8.5 each, Rfl: 2 .  aspirin EC 81 MG tablet, Take 1 tablet (81 mg total) by mouth daily. Swallow whole., Disp: 90 tablet, Rfl: 3 .  B-D 3CC LUER-LOK SYR 25GX1" 25G X 1" 3 ML MISC, , Disp: , Rfl:  .  calcium citrate-vitamin D (CALCIUM CITRATE CHEWY BITE) 500-500 MG-UNIT chewable tablet, Chew 1 tablet by mouth 3 (three) times daily., Disp: , Rfl:  .  clobetasol ointment (TEMOVATE) 0.10 %, Apply 1 application topically daily as needed (psoriasis). , Disp: , Rfl: 0 .  cyanocobalamin (,VITAMIN B-12,) 1000 MCG/ML injection, INJECT 1 ML (1,000 MCG TOTAL) INTO THE MUSCLE EVERY 14 (FOURTEEN) DAYS., Disp: 6 mL, Rfl: 0 .  cyclobenzaprine (FLEXERIL) 10 MG tablet, Take 10 mg by mouth at bedtime as needed for muscle spasms., Disp: , Rfl:  .  fluocinonide (LIDEX) 0.05 % external solution, Apply 1 application topically 2 (two) times daily as needed (psoriasis). , Disp: , Rfl:  .  gentamicin cream (GARAMYCIN) 0.1 %, Apply 1 application topically 2 (two) times daily., Disp: 30 g, Rfl: 1 .  IRON-VITAMIN C PO, Take 1 tablet by mouth daily., Disp: , Rfl:  .  ketoconazole (NIZORAL) 2 % shampoo, Apply 1 application topically 2 (two) times daily as needed (psoriasis). , Disp: , Rfl:  .  Lactobacillus (ACIDOPHILUS PO), Take 1 capsule by mouth daily. Probiotic plus Calcium, Disp: , Rfl:  .  loratadine (CLARITIN) 10 MG tablet, Take 10 mg by mouth daily., Disp: , Rfl:  .  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 , Disp: , Rfl: 4 .  mometasone (NASONEX) 50 MCG/ACT nasal spray, Place 2 sprays into the nose daily., Disp: 17 each, Rfl: 2 .  montelukast (SINGULAIR) 10 MG tablet, TAKE 1 TABLET (10 MG TOTAL) BY MOUTH AT BEDTIME. TAKE 1 TABLET(10 MG) BY MOUTH AT BEDTIME, Disp: 90 tablet, Rfl: 3 .  Multiple Vitamins-Minerals (MULTIVITAMIN ADULT) CHEW, Chew 1 tablet by mouth daily., Disp: , Rfl:  .  ondansetron  (ZOFRAN) 4 MG tablet, Take by mouth., Disp: , Rfl:  .  pantoprazole (PROTONIX) 40 MG tablet, Take 40 mg by mouth daily., Disp: , Rfl:  .  pregabalin (LYRICA) 100 MG capsule, Take 1 capsule (100 mg total) by mouth 3 (three) times daily., Disp: 270 capsule, Rfl: 0 .  promethazine (PHENERGAN) 12.5 MG tablet, TAKE 1 TABLET BY MOUTH EVERY 6 HOURS AS NEEDED FOR MIGRAINES WITH NAUSEA, Disp: 20 tablet, Rfl: 0 .  sulfamethoxazole-trimethoprim (  BACTRIM DS) 800-160 MG tablet, Take 1 tablet by mouth 2 (two) times daily., Disp: 6 tablet, Rfl: 0 .  traZODone (DESYREL) 100 MG tablet, Take 200 mg by mouth at bedtime. , Disp: , Rfl:  .  TRINTELLIX 20 MG TABS tablet, Take 20 mg by mouth daily. , Disp: , Rfl:  .  Vitamin D, Ergocalciferol, (DRISDOL) 1.25 MG (50000 UNIT) CAPS capsule, TAKE 1 CAPSULE (50,000 UNITS TOTAL) BY MOUTH EVERY SATURDAY., Disp: 12 capsule, Rfl: 1 .  zonisamide (ZONEGRAN) 50 MG capsule, Take 150 mg by mouth daily. , Disp: , Rfl: 2 .  atorvastatin (LIPITOR) 40 MG tablet, Take 1 tablet (40 mg total) by mouth daily., Disp: 30 tablet, Rfl: 11  Allergies  Allergen Reactions  . Aspartame And Phenylalanine Nausea Only and Other (See Comments)    GI upset   . Gabapentin     Bladder incontinence at higher dose  . Linzess [Linaclotide]     Worsening of diarrhea  . Other Hives and Other (See Comments)    Ricotta Cheese: flushed and hot  . Adhesive [Tape] Rash    Some clear tapes  . Cymbalta [Duloxetine Hcl] Palpitations    And photosensitivity  . Tapentadol Rash    Some clear tapes    I personally reviewed active problem list, medication list, allergies, family history, social history, health maintenance, notes from last encounter with the patient/caregiver today.   ROS  Constitutional: Negative for fever or weight change.  Respiratory: Negative for cough and shortness of breath.   Cardiovascular: Negative for chest pain or palpitations.  Gastrointestinal: Negative for abdominal pain, no  bowel changes.  Musculoskeletal: Negative for gait problem or joint swelling.  Skin: Negative for rash.  Neurological: Negative for dizziness or headache.  No other specific complaints in a complete review of systems (except as listed in HPI above).   Objective  Vitals:   07/06/20 1127  BP: 130/78  Pulse: 82  Resp: 16  Temp: 98.1 F (36.7 C)  TempSrc: Oral  SpO2: 98%  Weight: 182 lb (82.6 kg)  Height: 5' 1"  (1.549 m)    Body mass index is 34.39 kg/m.  Physical Exam  Constitutional: Patient appears well-developed and well-nourished. Obese  No distress.  HEENT: head atraumatic, normocephalic, pupils equal and reactive to light,  neck supple Cardiovascular: Normal rate, regular rhythm and normal heart sounds.  No murmur heard. No BLE edema. Muscular Skeletal: trigger point negative  Pulmonary/Chest: Effort normal and breath sounds normal. No respiratory distress. Abdominal: Soft.  There is no tenderness. Psychiatric: Patient has a normal mood and affect. behavior is normal. Judgment and thought content normal.  Recent Results (from the past 2160 hour(s))  POCT Urinalysis Dipstick     Status: None   Collection Time: 06/20/20  3:42 PM  Result Value Ref Range   Color, UA Yellow    Clarity, UA Clear    Glucose, UA Negative Negative   Bilirubin, UA Negative    Ketones, UA Negative    Spec Grav, UA 1.015 1.010 - 1.025   Blood, UA Negative    pH, UA 6.0 5.0 - 8.0   Protein, UA Negative Negative   Urobilinogen, UA 0.2 0.2 or 1.0 E.U./dL   Nitrite, UA Negative    Leukocytes, UA Negative Negative   Appearance     Odor    CULTURE, URINE COMPREHENSIVE     Status: Abnormal   Collection Time: 06/20/20  4:16 PM   Specimen: Urine  Result Value Ref Range  MICRO NUMBER: [id number]    SPECIMEN QUALITY: Adequate    Source OTHER (SPECIFY)    STATUS: FINAL    ISOLATE 1: Klebsiella pneumoniae (A)     Comment: 50,000-100,000 CFU/mL of Klebsiella pneumoniae      Susceptibility    Klebsiella pneumoniae - CULT, URN, SPECIAL NEGATIVE 1    AMOX/CLAVULANIC <=2 Sensitive     AMPICILLIN 16 Resistant     AMPICILLIN/SULBACTAM <=2 Sensitive     CEFAZOLIN* <=4 Not Reportable      * For infections other than uncomplicated UTIcaused by E. coli, K. pneumoniae or P. mirabilis:Cefazolin is resistant if MIC > or = 8 mcg/mL.(Distinguishing susceptible versus intermediatefor isolates with MIC < or = 4 mcg/mL requiresadditional testing.)For uncomplicated UTI caused by E. coli,K. pneumoniae or P. mirabilis: Cefazolin issusceptible if MIC <32 mcg/mL and predictssusceptible to the oral agents cefaclor, cefdinir,cefpodoxime, cefprozil, cefuroxime, cephalexinand loracarbef.    CEFEPIME <=1 Sensitive     CEFTRIAXONE <=1 Sensitive     CIPROFLOXACIN <=0.25 Sensitive     LEVOFLOXACIN <=0.12 Sensitive     ERTAPENEM <=0.5 Sensitive     GENTAMICIN <=1 Sensitive     IMIPENEM <=0.25 Sensitive     NITROFURANTOIN <=16 Sensitive     PIP/TAZO <=4 Sensitive     TOBRAMYCIN <=1 Sensitive     TRIMETH/SULFA* <=20 Sensitive      * For infections other than uncomplicated UTIcaused by E. coli, K. pneumoniae or P. mirabilis:Cefazolin is resistant if MIC > or = 8 mcg/mL.(Distinguishing susceptible versus intermediatefor isolates with MIC < or = 4 mcg/mL requiresadditional testing.)For uncomplicated UTI caused by E. coli,K. pneumoniae or P. mirabilis: Cefazolin issusceptible if MIC <32 mcg/mL and predictssusceptible to the oral agents cefaclor, cefdinir,cefpodoxime, cefprozil, cefuroxime, cephalexinand loracarbef.Legend:S = Susceptible  I = IntermediateR = Resistant  NS = Not susceptible* = Not tested  NR = Not reported**NN = See antimicrobic comments  POCT HgB A1C     Status: None   Collection Time: 07/06/20 11:31 AM  Result Value Ref Range   Hemoglobin A1C 5.1 4.0 - 5.6 %   HbA1c POC (<> result, manual entry)     HbA1c, POC (prediabetic range)     HbA1c, POC (controlled diabetic range)      Diabetic Foot  Exam: Diabetic Foot Exam - Simple   Simple Foot Form Diabetic Foot exam was performed with the following findings: Yes 07/06/2020 11:51 AM  Visual Inspection No deformities, no ulcerations, no other skin breakdown bilaterally: Yes Sensation Testing Intact to touch and monofilament testing bilaterally: Yes Pulse Check Posterior Tibialis and Dorsalis pulse intact bilaterally: Yes Comments      PHQ2/9: Depression screen [facility] 2/9 07/06/2020 06/20/2020 01/07/2020 07/21/2019 07/15/2019  Decreased Interest 0 1 0 0 0  Down, Depressed, Hopeless 0 0 1 0 0  PHQ - 2 Score 0 1 1 0 0  Altered sleeping 0 0 0 0 0  Tired, decreased energy 0 0 1 0 2  Change in appetite 0 0 0 0 0  Feeling bad or failure about yourself  0 0 0 0 0  Trouble concentrating 1 0 0 0 2  Moving slowly or fidgety/restless 0 0 0 0 0  Suicidal thoughts 0 0 0 0 0  PHQ-9 Score 1 1 2  0 4  Difficult doing work/chores - - Not difficult at all Not difficult at all Somewhat difficult  Some recent data might be hidden    phq 9 is negative   Fall Risk: Fall Risk  07/06/2020 06/20/2020  04/29/2020 01/07/2020 08/07/2019  Falls in the past year? 0 0 0 0 0  Number falls in past yr: 0 0 - 0 -  Injury with Fall? 0 0 - 0 -  Follow up - - - - -     Functional Status Survey: Is the patient deaf or have difficulty hearing?: No Does the patient have difficulty seeing, even when wearing glasses/contacts?: No Does the patient have difficulty concentrating, remembering, or making decisions?: Yes Does the patient have difficulty walking or climbing stairs?: No Does the patient have difficulty dressing or bathing?: No Does the patient have difficulty doing errands alone such as visiting a doctor's office or shopping?: No    Assessment & Plan  1. Diet-controlled diabetes mellitus ([place])  - POCT HgB A1C - HM Diabetes Foot Exam  2. B12 deficiency   3. Atherosclerosis of aorta ([place])   4. Fibromyalgia  pregabalin (LYRICA) 100 MG capsule;  Take 1 capsule (100 mg total) by mouth 3 (three) times daily.  Dispense: 270 capsule; Refill: 1  5. Vitamin D deficiency   6. History of bariatric surgery   7. Migraine with aura and without status migrainosus, not intractable   8. GERD without esophagitis   9. History of thyroiditis   10. Major depression in remission ([facility])   11. Mild persistent asthma without complication  - montelukast (SINGULAIR) 10 MG tablet; Take 1 tablet (10 mg total) by mouth at bedtime. TAKE 1 TABLET(10 MG) BY MOUTH AT BEDTIME  Dispense: 90 tablet; Refill: 3  12. Chronic insomnia   13. COVID-19 long hauler manifesting chronic concentration deficit  On ADderal now

## 2020-07-06 ENCOUNTER — Encounter: Payer: Self-pay | Admitting: Family Medicine

## 2020-07-06 ENCOUNTER — Other Ambulatory Visit: Payer: Self-pay

## 2020-07-06 ENCOUNTER — Ambulatory Visit (INDEPENDENT_AMBULATORY_CARE_PROVIDER_SITE_OTHER): Payer: No Typology Code available for payment source | Admitting: Family Medicine

## 2020-07-06 VITALS — BP 130/78 | HR 82 | Temp 98.1°F | Resp 16 | Ht 61.0 in | Wt 182.0 lb

## 2020-07-06 DIAGNOSIS — M797 Fibromyalgia: Secondary | ICD-10-CM | POA: Diagnosis not present

## 2020-07-06 DIAGNOSIS — U099 Post covid-19 condition, unspecified: Secondary | ICD-10-CM

## 2020-07-06 DIAGNOSIS — E559 Vitamin D deficiency, unspecified: Secondary | ICD-10-CM

## 2020-07-06 DIAGNOSIS — E538 Deficiency of other specified B group vitamins: Secondary | ICD-10-CM | POA: Diagnosis not present

## 2020-07-06 DIAGNOSIS — I700 Atherosclerosis of aorta: Secondary | ICD-10-CM | POA: Diagnosis not present

## 2020-07-06 DIAGNOSIS — F325 Major depressive disorder, single episode, in full remission: Secondary | ICD-10-CM

## 2020-07-06 DIAGNOSIS — Z9884 Bariatric surgery status: Secondary | ICD-10-CM

## 2020-07-06 DIAGNOSIS — E119 Type 2 diabetes mellitus without complications: Secondary | ICD-10-CM | POA: Diagnosis not present

## 2020-07-06 DIAGNOSIS — F5104 Psychophysiologic insomnia: Secondary | ICD-10-CM

## 2020-07-06 DIAGNOSIS — G43109 Migraine with aura, not intractable, without status migrainosus: Secondary | ICD-10-CM

## 2020-07-06 DIAGNOSIS — R41840 Attention and concentration deficit: Secondary | ICD-10-CM

## 2020-07-06 DIAGNOSIS — Z8639 Personal history of other endocrine, nutritional and metabolic disease: Secondary | ICD-10-CM

## 2020-07-06 DIAGNOSIS — J4530 Mild persistent asthma, uncomplicated: Secondary | ICD-10-CM

## 2020-07-06 DIAGNOSIS — K219 Gastro-esophageal reflux disease without esophagitis: Secondary | ICD-10-CM

## 2020-07-06 LAB — POCT GLYCOSYLATED HEMOGLOBIN (HGB A1C): Hemoglobin A1C: 5.1 % (ref 4.0–5.6)

## 2020-07-06 MED ORDER — MONTELUKAST SODIUM 10 MG PO TABS: 10.0000 mg | ORAL_TABLET | Freq: Every day | ORAL | 3 refills | Status: DC

## 2020-07-06 MED ORDER — PREGABALIN 100 MG PO CAPS: 100.0000 mg | ORAL_CAPSULE | Freq: Three times a day (TID) | ORAL | 1 refills | Status: DC

## 2020-07-15 ENCOUNTER — Other Ambulatory Visit: Payer: Self-pay | Admitting: Family Medicine

## 2020-07-15 DIAGNOSIS — E538 Deficiency of other specified B group vitamins: Secondary | ICD-10-CM

## 2020-08-03 ENCOUNTER — Other Ambulatory Visit: Payer: Self-pay

## 2020-08-03 ENCOUNTER — Encounter: Payer: PRIVATE HEALTH INSURANCE | Attending: Surgery | Admitting: Dietician

## 2020-08-03 ENCOUNTER — Encounter: Payer: Self-pay | Admitting: Dietician

## 2020-08-03 VITALS — Ht 61.0 in | Wt 185.0 lb

## 2020-08-03 DIAGNOSIS — Z6834 Body mass index (BMI) 34.0-34.9, adult: Secondary | ICD-10-CM

## 2020-08-03 DIAGNOSIS — E669 Obesity, unspecified: Secondary | ICD-10-CM | POA: Insufficient documentation

## 2020-08-03 NOTE — Progress Notes (Signed)
Nutrition Therapy for Post-Operative Bariatric Diet Follow-up visit:  9 months post-op RnY  revision surgery  Medical Nutrition Therapy:  Appt start time: 1520 end time:  4076.  Anthropometrics:   Date 08/07/19 11/16/19 01/04/20 04/29/20 08/03/20  BMI 36 35.3 34.7 33.8 34.96  Weight (lbs) 190.4 186.6 183.6 178.8 185.0  Skeletal muscle (lbs) 50.3 48.5 48.5 47.0   % body fat 51.2 51.8 50.5 51.0    Clinical: Medications: albuterol, aspirin, atorvastatin, cyclobenzaprine, ketoconazole shampoo, lactobacillus, loratadine, mometasone, montelukast, ondansetron, pantoprazole, pregabalin, traZODone, Trintellix, zonisamide  Supplementation: bariatric vitamin 1x daily; iron 1x daily; calcium citrate 3x daily Health/ medical history changes: increased fibromyalgia pain recently  GI symptoms: N/V/D/C: occasional nausea and vomiting episodes most recently 2 weeks ago; currently constipation (no BM for past 3-4 days) Dumping Syndrome: no Hair loss: no  Dietary/ Lifestyle Progress: Patient reports significant challenges in past several weeks, with sudden death of close friend, increased fibromyalgia pain, and constipation. She has not yet found a new job, has 3 months of severance pay left. She continues to occasionally have food getting "stuck" in esophagus but is able to eat most protein foods more easily; stopped bacon Uses small plates/ containers for meals, avoids most high-carb foods except for a few bites of bread or corn. She has been including small amounts of sugar in iced tea.  Dietary recall: Eating pattern: 3 meals and 2-3 snacks daily Dining out: 3-4 meals weekly  Breakfast: eggs; omelet with [place], veg; poached eggs on [place] no bread and few bites grits; protein/ breakfast shake Snack: yogurt or quest bar or raw cauliflower or fruit ie berries, cherries, nectarines, pineapple Lunch: eggs today; deli meat and cheese usu no bread; chicken/ salmon/ chicken tenders little breading cooked  in air fryer + sm bite stuffing + veg Snack: yogurt or quest bar  Dinner: 1x a week-- ceviche with avocado, no raw onion; 4-5 pcs shrimp with veggies small amount cheese, avoids rice Snack: none or same as am/pm  Fluid intake: patient reports >64oz -- mostly water (with sugar free flavoring), 1/2 sweet tea, small amount [place] soda (works to release carbonation before drinking) Estimated total protein intake: 60-70g daily Bariatric diet adherence:  Using straws: yes Drinking fluids during meals: no Carbonated beverages: 1x a week, tries to release carbonation before drinking  Recent physical activity:  was increasing physical activity until past 3 weeks due to emotional stress and physical pain   Nutrition Intervention:   Reviewed progress since previous visit. Reviewed goals for limiting sugar and fat content in foods. Discussed continued expansion of food choices while continuing to start meal with protein source, then eat low-carb veggies, then other foods. Encouraged resuming physical activity as tolerated.  Nutritional Diagnosis:  Thrall-3.3 Overweight/obesity As related to history of overweight and bariatric surgical procedures.  As evidenced by patient with current BMI of 34.96, following bariatric diet guidelines for ongoing weight loss after RnY revision.  Teaching Method Utilized:  Visual Auditory Hands on  Materials provided: Phase 5 and 6 bariatric diet handouts  Learning Readiness:  Change in progress  Barriers to learning/adherence to lifestyle change: none  Demonstrated degree of understanding via:  Teach Back      Plan: Return for 12 month post-op visit on 11/03/20 at 1:15pm

## 2020-08-03 NOTE — Patient Instructions (Signed)
Continue to eat protein foods first followed by low carb veg and then other foods.  Great job keeping portions controlled! Gradually resume regular exercise as able.

## 2020-08-29 ENCOUNTER — Other Ambulatory Visit: Payer: Self-pay

## 2020-08-29 ENCOUNTER — Other Ambulatory Visit: Payer: Self-pay | Admitting: Family Medicine

## 2020-08-29 ENCOUNTER — Encounter: Payer: Self-pay | Admitting: Family Medicine

## 2020-08-29 DIAGNOSIS — J3089 Other allergic rhinitis: Secondary | ICD-10-CM

## 2020-09-01 ENCOUNTER — Other Ambulatory Visit: Payer: Self-pay | Admitting: Family Medicine

## 2020-09-01 DIAGNOSIS — J4520 Mild intermittent asthma, uncomplicated: Secondary | ICD-10-CM

## 2020-09-01 NOTE — Telephone Encounter (Signed)
Requested medication (s) are due for refill today: no  Requested medication (s) are on the active medication list:  yes   Last refill:  08/01/2020  Future visit scheduled: yes  Notes to clinic:  Failed protocol: One inhaler should last at least one month. If the patient is requesting refills earlier, contact the patient to check for uncontrolled symptoms   Requested Prescriptions  Pending Prescriptions Disp Refills   albuterol (VENTOLIN HFA) 108 (90 Base) MCG/ACT inhaler [Pharmacy Med Name: ALBUTEROL HFA (PROAIR) INHALER] 8.5 each 2    Sig: TAKE 2 PUFFS EVERY 6 HOURS AS NEEDED FOR WHEEZING OR SHORTNESS OF BREATH. (VENTOLIN NOT COVERED)      Pulmonology:  Beta Agonists Failed - 09/01/2020  1:54 AM      Failed - One inhaler should last at least one month. If the patient is requesting refills earlier, contact the patient to check for uncontrolled symptoms.      Passed - Valid encounter within last 12 months    Recent Outpatient Visits           1 month ago Diet-controlled diabetes mellitus [facility])   [facility] Steele Sizer, MD   2 months ago [facility] Steele Sizer, MD   7 months ago Diet-controlled diabetes mellitus [facility])   [facility] Steele Sizer, MD   10 months ago Diet-controlled diabetes mellitus [facility])   [facility] Steele Sizer, MD   1 year ago Diet-controlled diabetes mellitus [facility])   [facility] Steele Sizer, MD       Future Appointments             In 4 months Ancil Boozer, Drue Stager, MD [facility], [facility]

## 2020-09-01 NOTE — Telephone Encounter (Signed)
Last seen 5.18.2022 next sch'd 11.18.2022

## 2020-09-04 ENCOUNTER — Other Ambulatory Visit: Payer: Self-pay | Admitting: Family Medicine

## 2020-09-04 DIAGNOSIS — J3089 Other allergic rhinitis: Secondary | ICD-10-CM

## 2020-09-04 NOTE — Telephone Encounter (Signed)
Requested medication (s) are due for refill today: yes  Requested medication (s) are on the active medication list: yes  Last refill:  06/29/20 17 each 2 RF  Future visit scheduled: yes  Notes to clinic:  2 mometasone 1 for nose and the other topical for rash  Please review/////was refused    Requested Prescriptions  Pending Prescriptions Disp Refills   mometasone (NASONEX) 50 MCG/ACT nasal spray [Pharmacy Med Name: MOMETASONE FUROATE 50 MCG SPRY] 17 each 2    Sig: Place 2 sprays into the nose daily.      Ear, Nose, and Throat: Nasal Preparations - Corticosteroids Passed - 09/04/2020  1:32 PM      Passed - Valid encounter within last 12 months    Recent Outpatient Visits           2 months ago Diet-controlled diabetes mellitus [facility])   [facility] Steele Sizer, MD   2 months ago [facility] Steele Sizer, MD   8 months ago Diet-controlled diabetes mellitus [facility])   [facility] Steele Sizer, MD   10 months ago Diet-controlled diabetes mellitus [facility])   [facility] Steele Sizer, MD   1 year ago Diet-controlled diabetes mellitus [facility])   [facility] Steele Sizer, MD       Future Appointments             In 4 months Ancil Boozer, Drue Stager, MD [facility], [facility]

## 2020-09-05 NOTE — Telephone Encounter (Signed)
Last seen 5.18.2022 next sch'd 11.18.2022

## 2020-09-09 ENCOUNTER — Encounter: Payer: Self-pay | Admitting: Family Medicine

## 2020-09-26 ENCOUNTER — Encounter: Payer: Self-pay | Admitting: Family Medicine

## 2020-10-01 ENCOUNTER — Other Ambulatory Visit: Payer: Self-pay | Admitting: Family Medicine

## 2020-10-01 DIAGNOSIS — J3089 Other allergic rhinitis: Secondary | ICD-10-CM

## 2020-10-01 NOTE — Telephone Encounter (Signed)
Alternative requested.  fluticasone (FLONASE) 50 MCG/ACT nasal spray       Changed from: mometasone (NASONEX) 50 MCG/ACT nasal spray   Sig: N/A   Disp:  Not specified    Refills:  0   Start: 10/01/2020   Class: Normal   Non-formulary For: Perennial allergic rhinitis   Last ordered: 3 weeks ago by Steele Sizer, MD Last refill: 10/01/2020   Rx #: [id number]   Pharmacy comment: Alternative Requested.   Ear, Nose, and Throat: Nasal Preparations - Corticosteroids Passed 10/01/2020 11:27 AM  Protocol Details  Valid encounter within last 12 months    This request has changes from the previous prescription.  [id number] [place]

## 2020-10-03 ENCOUNTER — Encounter: Payer: Self-pay | Admitting: Family Medicine

## 2020-10-03 ENCOUNTER — Other Ambulatory Visit: Payer: Self-pay | Admitting: Family Medicine

## 2020-10-03 MED ORDER — FLUNISOLIDE 25 MCG/ACT (0.025%) NA SOLN: 2.0000 | Freq: Two times a day (BID) | NASAL | 0 refills | Status: DC

## 2020-10-22 ENCOUNTER — Other Ambulatory Visit: Payer: Self-pay | Admitting: Family Medicine

## 2020-10-22 DIAGNOSIS — E559 Vitamin D deficiency, unspecified: Secondary | ICD-10-CM

## 2020-10-22 NOTE — Telephone Encounter (Signed)
Requested medication (s) are due for refill today: yes  Requested medication (s) are on the active medication list: yes  Last refill:  05/05/20  Future visit scheduled: yes  Notes to clinic:  not delegated to NT to RF   Requested Prescriptions  Pending Prescriptions Disp Refills   Vitamin D, Ergocalciferol, (DRISDOL) 1.25 MG (50000 UNIT) CAPS capsule [Pharmacy Med Name: VITAMIN D2 1.'25MG'$ (50,000 UNIT)] 4 capsule 5    Sig: TAKE 1 CAPSULE (50,000 UNITS TOTAL) BY MOUTH EVERY SATURDAY.     Endocrinology:  Vitamins - Vitamin D Supplementation Failed - 10/22/2020  3:09 PM      Failed - 50,000 IU strengths are not delegated      Failed - Ca in normal range and within 360 days    Calcium  Date Value Ref Range Status  11/04/2019 8.3 (L) 8.9 - 10.3 mg/dL Final          Failed - Phosphate in normal range and within 360 days    No results found for: PHOS        Failed - Vitamin D in normal range and within 360 days    Vit D, 25-Hydroxy  Date Value Ref Range Status  12/31/2018 31.3 30.0 - 100.0 ng/mL Final    Comment:    Vitamin D deficiency has been defined by the Institute of Medicine and an Endocrine Society practice guideline as a level of serum 25-OH vitamin D less than 20 ng/mL (1,2). The Endocrine Society went on to further define vitamin D insufficiency as a level between 21 and 29 ng/mL (2). 1. IOM (Institute of Medicine). 2010. Dietary reference    intakes for calcium and D. [place]: The    [employer]. 2. Holick MF, Binkley [place], Bischoff-Ferrari HA, et al.    Evaluation, treatment, and prevention of vitamin D    deficiency: an Endocrine Society clinical practice    guideline. JCEM. 2011 Jul; 96(7):1911-30.           Passed - Valid encounter within last 12 months    Recent Outpatient Visits           3 months ago Diet-controlled diabetes mellitus [facility])   [facility] Steele Sizer, MD   4 months ago [facility] Steele Sizer, MD   9 months ago Diet-controlled diabetes mellitus [facility])   [facility] Sizer, MD   1 year ago Diet-controlled diabetes mellitus [facility])   [facility] Steele Sizer, MD   1 year ago Diet-controlled diabetes mellitus [facility])   [facility] Steele Sizer, MD       Future Appointments             In 2 months Ancil Boozer, Drue Stager, MD [facility], [facility] [address]

## 2020-10-25 ENCOUNTER — Other Ambulatory Visit: Payer: Self-pay | Admitting: Family Medicine

## 2020-10-25 ENCOUNTER — Other Ambulatory Visit: Payer: Self-pay

## 2020-11-03 ENCOUNTER — Encounter: Payer: Self-pay | Admitting: Dietician

## 2020-11-03 ENCOUNTER — Encounter: Payer: PRIVATE HEALTH INSURANCE | Attending: Surgery | Admitting: Dietician

## 2020-11-03 ENCOUNTER — Other Ambulatory Visit: Payer: Self-pay

## 2020-11-03 VITALS — Ht 65.0 in | Wt 194.2 lb

## 2020-11-03 DIAGNOSIS — Z9884 Bariatric surgery status: Secondary | ICD-10-CM | POA: Insufficient documentation

## 2020-11-03 DIAGNOSIS — Z903 Acquired absence of stomach [part of]: Secondary | ICD-10-CM | POA: Insufficient documentation

## 2020-11-03 DIAGNOSIS — K449 Diaphragmatic hernia without obstruction or gangrene: Secondary | ICD-10-CM | POA: Diagnosis not present

## 2020-11-03 DIAGNOSIS — E669 Obesity, unspecified: Secondary | ICD-10-CM

## 2020-11-03 DIAGNOSIS — Z6836 Body mass index (BMI) 36.0-36.9, adult: Secondary | ICD-10-CM

## 2020-11-03 NOTE — Patient Instructions (Signed)
Continue to follow bariatric diet guidelines and regular physical activity. These habits are providing benefit, even if the scale is not showing positive results. Look for healthy ways to unwind and manage stress.

## 2020-11-03 NOTE — Progress Notes (Signed)
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   Medical Nutrition Therapy:  Appt start time: 1330 end time:  1400  Anthropometrics:  Date 08/07/19 11/16/19 01/04/20 04/29/20 08/03/20 11/03/20  BMI 36 35.3 34.7 33.8 34.96 36.7  Weight (lbs) 190.4 186.6 183.6 178.8 185.0 194.2  Skeletal muscle (lbs) 50.3 48.5 48.5 47.0  49.4 51.4  % body fat 51.2 51.8 50.5 51.0 50.7 51.0   Clinical: Medications: albuterol, aspirin, atorvastatin, cyclobenzaprine, ketoconazole shampoo, lactobacillus, loratadine, mometasone, montelukast, ondansetron, pantoprazole, phernternine, pregabalin, traZODone, Trintellix, zonisamide Supplementation: bariatric celebrate vitamins 1x daily; iron supplement 1x daily; calcium citrate 3x daily  Health/ medical history changes: ongoing pain from fibromyalgia; some days very limited mobility GI symptoms: N/V/D/C: no nausea/ vomiting; no constipation; some loose stools; GERD (takes pantoprazole or tums prn) Dumping Syndrome: no Hair loss: mild  Dietary/ Lifestyle Progress: Has begun taking Phentermine which has curbed appetite but has not resulted in any weight loss. She reports she is at beginning of menstrual cycle which could in part explain higher weight today. Has annual appt with PCP 11/2020 Continues to search for employment.   Dietary recall: Eating pattern: 3 meals and 2 snacks daily Dining out: 3-4 times a week Breakfast: 1-2 egg omelet with [place], veg; 2 poached eggs sometimes with tomatoes; 1/2 egg and cheese sandwich (less than 1pc bread) Snack: yogurt 15g pro Lunch: leftovers including average 2oz portion protein  Snack: yogurt; occ cheez-its crackers (partial handful)  Dinner: 2oz chicken or fish (air fried)+ veg + some fruits Snack: none  Fluid intake: water, sugar free (Truvia) flavoring; 1/2- sweet tea 8oz every 2 weeks -- meets 64oz goal Estimated total protein intake:  60-70g Bariatric diet adherence:  Using straws: yes, no issues per patient Drinking fluids during meals: no Carbonated beverages: occasionally, stirs to eliminate carbonation, dilutes with ice  Recent physical activity:  10000 steps daily, going to stores, etc in person rather than online shopping   Nutrition Intervention:   Reviewed progress since previous visit. Commended patient for maintaining diet guidelines despite weight regain. Discussed appropriate bariatric diet intake and balance at 12 months post-op Discussed role of stress in weight control and incorporating strategies to deal with stress/ relax. Scheduled another MNT follow up for 15 months post op.  Nutritional Diagnosis:  [place]-3.3 Overweight/obesity As related to genetic history, history of excess calories and inadequate physical activity.  As evidenced by patient with current BMI of 36.7 experiencing weight gain after bariatric revision.  Teaching Method Utilized:  Visual Auditory Hands on  Materials provided: [phone] kcal bariatric menus (AND) 52 Proven Stress Reducers  Learning Readiness:  Change in progress  Barriers to learning/adherence to lifestyle change: none  Demonstrated degree of understanding via:  Teach Back      Plan: Return for MNT follow up 02/02/21 at 1:30pm

## 2020-11-15 ENCOUNTER — Encounter: Payer: Self-pay | Admitting: Family Medicine

## 2020-11-17 ENCOUNTER — Other Ambulatory Visit: Payer: Self-pay

## 2020-11-17 ENCOUNTER — Encounter: Payer: Self-pay | Admitting: Unknown Physician Specialty

## 2020-11-17 ENCOUNTER — Ambulatory Visit (INDEPENDENT_AMBULATORY_CARE_PROVIDER_SITE_OTHER): Payer: PRIVATE HEALTH INSURANCE | Admitting: Unknown Physician Specialty

## 2020-11-17 VITALS — BP 132/78 | HR 75 | Temp 98.2°F | Resp 16 | Ht 61.0 in | Wt 196.3 lb

## 2020-11-17 DIAGNOSIS — L509 Urticaria, unspecified: Secondary | ICD-10-CM

## 2020-11-17 MED ORDER — PREDNISONE 10 MG (21) PO TBPK: ORAL_TABLET | ORAL | 0 refills | Status: DC

## 2020-11-17 NOTE — Progress Notes (Signed)
BP 132/78   Pulse 75   Temp 98.2 F (36.8 C) (Oral)   Resp 16   Ht 5\' 1"  (1.549 m)   Wt 196 lb 4.8 oz (89 kg)   LMP 11/02/2020   SpO2 96%   BMI 37.09 kg/m    Subjective:    Patient ID: Claire [name], female    DOB: 1971-01-25, 50 y.o.   MRN: [id number]  HPI: Claire [name] is a 50 y.o. female  Chief Complaint  Patient presents with   Rash    Widespread has already seen dermatologist she was told it was contact dermatitis and to change laundry detergent and use rx for cream.  Pt states rash has gotten worse, she called derm back and DR. States it must be coming from a med.  Pt states no new meds   Pt states she had a rash for 1 1/2 months.  Started as a patch on upper left chest.  Since then it has spread over chest, back, and now down back of legs.  Denies new medications.  She is already taking Singulair and Claritin for allergies and asthma.  States rash itches, severity comes and goes.  No particular time of day is worse.  Eats no red meat except deer meat twice a week.  No sleeping well as constantly up.  Takes benadryl twice during the night.    Has a relationship with a dermatologist due to psoriasis.  She received Clobetasol but only very nsmall tubes with no noticeable difference in symptoms  Reports had Covid 2 years ago and still has brain fog.    Relevant past medical, surgical, family and social history reviewed and updated as indicated. Interim medical history since our last visit reviewed. Allergies and medications reviewed and updated.  Review of Systems  Constitutional:  Negative for activity change, fatigue and fever.  Respiratory:  Negative for cough, chest tightness and shortness of breath.   Gastrointestinal: Negative.    Per HPI unless specifically indicated above     Objective:    BP 132/78   Pulse 75   Temp 98.2 F (36.8 C) (Oral)   Resp 16   Ht 5\' 1"  (1.549 m)   Wt 196 lb 4.8 oz (89 kg)   LMP 11/02/2020   SpO2 96%   BMI 37.09  kg/m   Wt Readings from Last 3 Encounters:  11/17/20 196 lb 4.8 oz (89 kg)  11/03/20 194 lb 3.2 oz (88.1 kg)  08/03/20 185 lb (83.9 kg)    Physical Exam Constitutional:      General: She is not in acute distress.    Appearance: Normal appearance. She is well-developed.  HENT:     Head: Normocephalic and atraumatic.  Eyes:     General: Lids are normal. No scleral icterus.       Right eye: No discharge.        Left eye: No discharge.     Conjunctiva/sclera: Conjunctivae normal.  Neck:     Vascular: No carotid bruit or JVD.  Cardiovascular:     Heart sounds: Normal heart sounds.  Pulmonary:     Effort: No respiratory distress.  Abdominal:     Palpations: There is no hepatomegaly or splenomegaly.  Musculoskeletal:        General: Normal range of motion.     Cervical back: Normal range of motion and neck supple.  Skin:    General: Skin is warm and dry.     Coloration: Skin is  not pale.     Findings: Rash present.     Comments: Fine papular rash back, legs  Neurological:     Mental Status: She is alert and oriented to person, place, and time.  Psychiatric:        Behavior: Behavior normal.        Thought Content: Thought content normal.        Judgment: Judgment normal.    Results for orders placed or performed in visit on 07/06/20  POCT HgB A1C  Result Value Ref Range   Hemoglobin A1C 5.1 4.0 - 5.6 %   HbA1c POC (<> result, manual entry)     HbA1c, POC (prediabetic range)     HbA1c, POC (controlled diabetic range)        Assessment & Plan:   Problem List Items Addressed This Visit   None Visit Diagnoses     Urticaria    -  Primary   Urticaria for 1 1/2 months.  Trial of prednisone.  Test for alpha gal.  Switch to Zyrtec in place of Clar[place].  Add OTC Pepsid   Relevant Orders   Alpha-Gal Panel   Celiac Disease Panel       Check celiac due to multiple reactivities.    Follow up plan: Follow with dermatologist

## 2020-11-18 ENCOUNTER — Other Ambulatory Visit: Payer: Self-pay | Admitting: Family Medicine

## 2020-11-18 DIAGNOSIS — E559 Vitamin D deficiency, unspecified: Secondary | ICD-10-CM

## 2020-11-20 ENCOUNTER — Other Ambulatory Visit: Payer: Self-pay | Admitting: Family Medicine

## 2020-11-20 NOTE — Telephone Encounter (Signed)
[facility] and spoke with [name]. 1 refill remains on prescription. [name] stated he will get prescription ready.

## 2020-11-22 LAB — CELIAC DISEASE PANEL
(tTG) Ab, IgA: <1 U/mL
(tTG) Ab, IgG: <1 U/mL
Gliadin IgA: <1 U/mL
Gliadin IgG: <1 U/mL
Immunoglobulin A: 227 mg/dL (ref 47–310)

## 2020-11-22 LAB — ALPHA-GAL PANEL
Beef IgE: <0.1 kU/L (ref <0.35)
Class: 0
Class: 0
Class: 0
Galactose-alpha-1,3-galactose IgE: <0.1 kU/L (ref <0.10)
LAMB/MUTTON IGE: <0.1 kU/L (ref <0.35)
Pork IgE: <0.1 kU/L (ref <0.35)

## 2020-12-07 ENCOUNTER — Encounter: Payer: Self-pay | Admitting: Family Medicine

## 2020-12-21 ENCOUNTER — Other Ambulatory Visit: Payer: Self-pay | Admitting: Family Medicine

## 2020-12-21 DIAGNOSIS — E559 Vitamin D deficiency, unspecified: Secondary | ICD-10-CM

## 2020-12-22 ENCOUNTER — Other Ambulatory Visit: Payer: Self-pay | Admitting: Family Medicine

## 2021-01-05 NOTE — Progress Notes (Signed)
Name: Claire [name]   MRN: [id number]    DOB: Aug 16, 1970   Date:01/06/2021       Progress Note  Subjective  Chief Complaint  Follow Up  HPI  Major Depression: she is now seeing Dr. Maryruth Bun who is also giving her therapy.  She is off Zolot and is currently on Trintelix 20 mg and off Lunesta and taking Trazodone for sleep and is sleeping well now .  She had side effects with Ambien ( interrupted sleep and sleep walking) ,  or Temazepam ( groggy ). She is following Dr. Shelda Altes recommendation. She has been unemployed but trying to find a job, but worried about her memory since COVID-19 in 21   , Dr. Maryruth Bun is giving her Adderal and seems to be helping with focus and concentration . Stable.  .  Asthma Mild Intermittentt: SOB since COVID but no wheezing or cough   Migraine: she is still seeing Dr. Neale Burly - she is on  Zonegran, also taking promethazine . She is doing better now, she has on average 1 migraine episodes per month, she has tension headaches intermittent . Stable    DMII: diagnosed at age 3, took medication for a while, but stopped all medications 10/25/2010 after bariatric surgery, glucose at home has been well controlled,  no polyphagia, polyuria or polydipsia. Maximum weight of 265 lbs and was down to 118.3 lbs - lowest weight back in 08/2017), she was gaining weight since she started to feel better after umbilical hernia repair with  lysis of adhesions and cecopexy back in 04/01/2018, she had a bariatric surgery revision 11/03/2019 by Dr. Daphine Deutscher, at that time weight was 196 lb, and it went down to 184 lbs and is now gradually gaining it back, weight today is 200 lbs A1C has been normal, we will check urine micro    IBS/GERD history of bypass surgery:  She was doing well after  lysis of adhesions and cecopexy back in 04/01/2018. She  is feeling much better very seldom has dysphagia nor, no longer vomiting.    Atherosclerosis of aorta and hyperlipidemia: on statin therapy. We will  recheck labs    B12 deficiency: she has been getting B12 injections weekly since Nov and last level was at goal at 808. We will recheck labs today    Thyroiditis: she had a neck lump that decreased in size at the time T4 was high and TSH suppressed, however last level was normal 08/22   Dizziness/syncope: seen at [facility], and has seen Dr. Christie Beckers 03/2019 , they ruled out POTS but may have had vasovagal syncope. She is doing better, seeing cardiologist now   History of COVID: diagnosed 04/21  she still has mental fogginess, and feel tired , she also has decrease in exercise tolerance. She is no longer working ( lost her job due to the pandemic). She was having difficulty focusing when she was taking classes   FMS: she has been more compliant with Lyrica 100 mg TID  She states medication is helping .   Right thumb trigger finger: wakes up with right thumb stuck  Urticaria: it was going for months, and finally resolved when she used a topical medication but not sure of the name and she will send me the name later. 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/03/2019   B12 deficiency 04/10/2018   Columnar epithelial-lined lower esophagus    Postprocedural disorder of digestive system    Esophageal dysphagia    Generalized abdominal pain    Persistent mood (affective) disorder, unspecified ([place]) 01/15/2018   Insomnia related to another mental disorder 01/15/2018   OCD (obsessive compulsive disorder) 10/29/2017   GAD (generalized anxiety disorder) 10/29/2017   Nephrolithiasis 10/22/2017   Vasovagal syncope 09/05/2017   Atherosclerosis of aorta ([place]) 12/21/2016   Hiatal hernia 08/17/2016   Diverticulosis of colon 08/17/2016   Iron deficiency  anemia 08/08/2015   Lumbosacral pain 01/07/2015   Muscle twitching 01/07/2015   Intertrigo 11/05/2014   Perennial allergic rhinitis 09/03/2014   Lung nodules 09/03/2014   Vitamin D deficiency 09/03/2014   Diet-controlled diabetes mellitus ([place]) 09/03/2014   History of kidney stones 09/03/2014   Chronic insomnia 09/03/2014   History of iron deficiency anemia 09/03/2014   Depression with anxiety 09/03/2014   Palpitations 06/22/2014   Bariatric surgery status 12/03/2013   Asthma, mild persistent 11/21/2012   CN (constipation) 11/21/2012   GERD without esophagitis 11/21/2012   History of hyperlipidemia 11/21/2012   Migraine with aura and without status migrainosus 11/21/2012   History of sleep apnea 11/21/2012   IBS (irritable bowel syndrome) 08/15/2012    Past Surgical History:  Procedure Laterality Date   BLADDER SURGERY  2010   BREAST BIOPSY Right 2014   stereotatic biopsy   CHOLECYSTECTOMY  2010   COLONOSCOPY  2014    Done at [place] N/A 01/28/2018   Procedure: COLONOSCOPY WITH PROPOFOL;  Surgeon: Virgel Manifold, MD;  Location: [place];  Service: Endoscopy;  Laterality: N/A;   DILITATION & CURRETTAGE/HYSTROSCOPY WITH NOVASURE ABLATION N/A 06/16/2015   Procedure: DILATATION & CURETTAGE/HYSTEROSCOPY WITH NOVASURE ABLATION;  Surgeon: Brien Few, MD;  Location: [facility];  Service: Gynecology;  Laterality: N/A;   ESOPHAGOGASTRODUODENOSCOPY (EGD) WITH PROPOFOL N/A 01/28/2018   Procedure: ESOPHAGOGASTRODUODENOSCOPY (EGD) WITH BIOPSIES;  Surgeon: Virgel Manifold, MD;  Location: [place];  Service: Endoscopy;  Laterality: N/A;   GASTRIC ROUX-EN-Y N/A 11/03/2019   Procedure: LAPAROSCOPIC 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 WITH UPPER ENDOSCOPY;  Surgeon: Johnathan Hausen, MD;  Location: [facility];  Service: General;  Laterality: N/A;   HIATAL HERNIA REPAIR N/A 11/03/2019   Procedure: HERNIA REPAIR  HIATAL;  Surgeon: Johnathan Hausen, MD;  Location: [facility];  Service: General;  Laterality: N/A;   [place] RESECTION  2012   LAPAROSCOPY N/A 06/16/2015   Procedure: LAPAROSCOPY DIAGNOSTIC;  Surgeon: Brien Few, MD;  Location: [facility];  Service: Gynecology;  Laterality: N/A;   LAPAROSCOPY N/A 04/01/2018   Procedure: LAPAROSCOPY DIAGNOSTIC ERAS PATHWAY ENTEROLYSIS, CECOPEXY;  Surgeon: Johnathan Hausen, MD;  Location: [facility];  Service: General;  Laterality: N/A;   LITHOTRIPSY  12/11/2017   laser lithotripsy   LYSIS OF ADHESION N/A 06/16/2015   Procedure: LYSIS OF ADHESION;  Surgeon: Brien Few, MD;  Location: [facility];  Service: Gynecology;  Laterality: N/A;   PLANTAR FASCIA SURGERY Bilateral    ROBOTIC ASSISTED SALPINGO OOPHERECTOMY Right 06/16/2015   Procedure: ROBOTIC ASSISTED SALPINGO OOPHORECTOMY, EXCISION OF RIGHT CUL DE [place] MASS;  Surgeon: Brien Few, MD;  Location: [facility];  Service: Gynecology;  Laterality: Right;   TONSILLECTOMY     UPPER GI ENDOSCOPY      Family History  Problem Relation Age of Onset   Heart attack Mother    Stroke Mother    Multiple myeloma Mother    Hyperlipidemia Father    Heart attack Sister    Heart attack Maternal Uncle    Heart attack Paternal Grandfather    Breast cancer Paternal Aunt    Breast cancer Paternal Grandmother    Lung cancer Maternal Grandfather     Social History   Tobacco Use   Smoking status: Former    Packs/day: 1.00    Years: 10.00    Pack years: 10.00    Types: Cigarettes    Quit date: 02/19/1993    Years since quitting: 27.8   Smokeless tobacco: Never  Substance Use Topics   Alcohol use: Yes    Alcohol/week: 0.0 standard drinks    Comment: occasionally     Current Outpatient Medications:    Accu-Chek FastClix Lancets MISC, USE TO TEST UP TO 4 TIMES A DAY, Disp: , Rfl:    ACCU-CHEK GUIDE test strip, , Disp: , Rfl:    ADDERALL XR 30 MG 24 hr capsule, Take 30 mg by mouth daily. , Disp: , Rfl:     albuterol (VENTOLIN HFA) 108 (90 Base) MCG/ACT inhaler, TAKE 2 PUFFS EVERY 6 HOURS AS NEEDED FOR WHEEZING OR SHORTNESS OF BREATH. (VENTOLIN NOT COVERED), Disp: 8.5 each, Rfl: 2   aspirin EC 81 MG tablet, Take 1 tablet (81 mg total) by mouth daily. Swallow whole., Disp: 90 tablet, Rfl: 3   B-D 3CC LUER-LOK SYR 25GX1" 25G X 1" 3 ML MISC, , Disp: , Rfl:    calcium citrate-vitamin D (CALCIUM CITRATE CHEWY BITE) 500-500 MG-UNIT chewable tablet, Chew 1 tablet by mouth 3 (three) times daily., Disp: , Rfl:    clobetasol cream (TEMOVATE) 0.05 %, SMARTSIG:Sparingly Topical Twice Daily, Disp: , Rfl:    clobetasol ointment (TEMOVATE) 1.60 %, Apply 1 application topically daily as needed (psoriasis). , Disp: , Rfl: 0   cyanocobalamin (,VITAMIN B-12,) 1000 MCG/ML injection, INJECT 1 ML (1,000 MCG TOTAL) INTO THE MUSCLE EVERY 14 (FOURTEEN) DAYS., Disp: 6 mL, Rfl: 1   cyclobenzaprine (FLEXERIL) 10 MG tablet, Take 10 mg by mouth at bedtime as needed for muscle spasms., Disp: , Rfl:    fluocinonide (LIDEX) 0.05 % external solution, Apply 1 application topically 2 (two) times daily as needed (psoriasis). , Disp: , Rfl:    gentamicin cream (GARAMYCIN) 0.1 %, Apply 1 application topically 2 (two) times daily., Disp: 30 g, Rfl: 1   IRON-VITAMIN C PO, Take 1 tablet by mouth daily., Disp: , Rfl:    ketoconazole (NIZORAL) 2 % shampoo, Apply 1 application topically 2 (two) times daily as needed (psoriasis). , Disp: , Rfl:    Lactobacillus (ACIDOPHILUS PO), Take 1 capsule by mouth daily. Probiotic plus Calcium, Disp: , Rfl:    loratadine (CLARITIN) 10 MG tablet, Take 10 mg by mouth daily., Disp: , Rfl:    mometasone (ELOCON) 0.1 % lotion, Apply 1 application topically daily as needed (psoriasis in the ears). , Disp: , Rfl: 4   montelukast (SINGULAIR) 10 MG tablet, Take 1 tablet (10 mg total) by mouth at bedtime. TAKE 1 TABLET(10 MG) BY MOUTH AT BEDTIME, Disp: 90 tablet, Rfl: 3   Multiple Vitamins-Minerals (MULTIVITAMIN ADULT)  CHEW, Chew 1 tablet by mouth daily., Disp: , Rfl:    ondansetron (ZOFRAN) 4 MG tablet, Take by mouth., Disp: , Rfl:    pantoprazole (PROTONIX) 40 MG tablet, Take 40 mg by mouth daily., Disp: ,  Rfl:    phentermine (ADIPEX-P) 37.5 MG tablet, Take by mouth., Disp: , Rfl:    predniSONE (STERAPRED UNI-PAK 21 TAB) 10 MG (21) TBPK tablet, As directed, Disp: 1 each, Rfl: 0   pregabalin (LYRICA) 100 MG capsule, Take 1 capsule (100 mg total) by mouth 3 (three) times daily., Disp: 270 capsule, Rfl: 1   traZODone (DESYREL) 100 MG tablet, Take 200 mg by mouth at bedtime. , Disp: , Rfl:    TRINTELLIX 20 MG TABS tablet, Take 20 mg by mouth daily. , Disp: , Rfl:    Vitamin D, Ergocalciferol, (DRISDOL) 1.25 MG (50000 UNIT) CAPS capsule, TAKE 1 CAPSULE (50,000 UNITS TOTAL) BY MOUTH EVERY SATURDAY., Disp: 4 capsule, Rfl: 0   zonisamide (ZONEGRAN) 50 MG capsule, Take 150 mg by mouth daily. , Disp: , Rfl: 2   atorvastatin (LIPITOR) 40 MG tablet, Take 1 tablet (40 mg total) by mouth daily., Disp: 30 tablet, Rfl: 11   flunisolide (NASALIDE) 25 MCG/ACT (0.025%) SOLN, PLACE 2 SPRAYS INTO THE NOSE 2 (TWO) TIMES DAILY. (Patient not taking: Reported on 01/06/2021), Disp: 25 mL, Rfl: 1  Allergies  Allergen Reactions   Aspartame And Phenylalanine Nausea Only and Other (See Comments)    GI upset    Gabapentin     Bladder incontinence at higher dose   Linzess [Linaclotide]     Worsening of diarrhea   Other Hives and Other (See Comments)    Ricotta Cheese: flushed and hot   Adhesive [Tape] Rash    Some clear tapes   Cymbalta [Duloxetine Hcl] Palpitations    And photosensitivity   Tapentadol Rash    Some clear tapes    I personally reviewed active problem list, medication list, allergies, family history, social history, health maintenance with the patient/caregiver today.   ROS  Constitutional: Negative for fever , positive for weight change.  Respiratory: Negative for cough , positive for shortness of breath.    Cardiovascular: Negative for chest pain or palpitations.  Gastrointestinal: Negative for abdominal pain, no bowel changes.  Musculoskeletal: Negative for gait problem or joint swelling.  Skin: Negative for rash.  Neurological: Negative for dizziness, positive for intermittent  headache.  No other specific complaints in a complete review of systems (except as listed in HPI above).   Objective  Vitals:   01/06/21 1344  BP: 132/80  Pulse: (!) 101  Resp: 16  Temp: 98 F (36.7 C)  SpO2: 98%  Weight: 200 lb (90.7 kg)  Height: $Remove'5\' 1"'WFoNFVp$  (1.549 m)    Body mass index is 37.79 kg/m.  Physical Exam  Constitutional: Patient appears well-developed and well-nourished. Obese  No distress.  HEENT: head atraumatic, normocephalic, pupils equal and reactive to light, neck supple Cardiovascular: Normal rate, regular rhythm and normal heart sounds.  No murmur heard. No BLE edema. Pulmonary/Chest: Effort normal and breath sounds normal. No respiratory distress. Abdominal: Soft.  There is no tenderness. Psychiatric: Patient has a normal mood and affect. behavior is normal. Judgment and thought content normal.   Recent Results (from the past 2160 hour(s))  Alpha-Gal Panel     Status: None   Collection Time: 11/17/20  9:05 AM  Result Value Ref Range   Galactose-alpha-1,3-galactose IgE <0.10 <0.10 kU/L    Comment: . Previous reports (JACI [phone]) have demonstrated that patients with IgE antibodies to galactose-a-1,3-galactose are at risk for delayed anaphylaxis, angioedema, or urticaria following consumption of beef, pork, or lamb.    Beef IgE <0.10 <0.35 kU/L   Class 0  LAMB/MUTTON IGE <0.10 <0.35 kU/L   Class 0    Pork IgE <0.10 <0.35 kU/L   Class 0     Comment: . The test method is the [employer] ImmunoCAP allergen-specific IgE system. CLASS INTERPRETATION   <0.10 kU/L= 0, Negative; 0.10 - 0.34 kU/L= 0/1, Equivocal/Borderline;  0.35 - 0.69  kU/L=1, Low Positive; 0.70 -  3.49 kU/L=2, Moderate Positive;  3.50  - 17.49 kU/L=3, High Positive; 17.50 - 49.99 kU/L= 4, Very High Positive; 50.00 - 99.99  kU/L= 5, Very High Positive;   >99.99 kU/L=6, Very High Positive . *This test was developed and its performance characteristics determined by [employer]. It has not been cleared or approved by the U.S. Food and Drug Administration.   Celiac Disease Panel     Status: None   Collection Time: 11/17/20  9:05 AM  Result Value Ref Range   Immunoglobulin A 227 47 - 310 mg/dL   (tTG) Ab, IgG <1.0 U/mL    Comment: Value          Interpretation -----          -------------- <15.0          Antibody not detected > or = 15.0    Antibody detected .    (tTG) Ab, IgA <1.0 U/mL    Comment: Value          Interpretation -----          -------------- <15.0          Antibody not detected > or = 15.0    Antibody detected .    Gliadin IgA <1.0 U/mL    Comment: Value          Interpretation -----          -------------- <15.0          Antibody not detected > or = 15.0    Antibody detected .    Gliadin IgG <1.0 U/mL    Comment: Value          Interpretation -----          -------------- <15.0          Antibody not detected > or = 15.0    Antibody detected .      PHQ2/9: Depression screen [facility] 2/9 01/06/2021 11/17/2020 07/06/2020 06/20/2020 01/07/2020  Decreased Interest 1 1 0 1 0  Down, Depressed, Hopeless 0 0 0 0 1  PHQ - 2 Score 1 1 0 1 1  Altered sleeping 0 0 0 0 0  Tired, decreased energy 0 0 0 0 1  Change in appetite 0 0 0 0 0  Feeling bad or failure about yourself  0 0 0 0 0  Trouble concentrating 1 0 1 0 0  Moving slowly or fidgety/restless 0 0 0 0 0  Suicidal thoughts 0 0 0 0 0  PHQ-9 Score $RemoveBef'2 1 1 1 2  'bsBKgDCUOx$ Difficult doing work/chores - Not difficult at all - - Not difficult at all  Some recent data might be hidden    phq 9 is positive   Fall Risk: Fall Risk  01/06/2021 11/17/2020 07/06/2020 06/20/2020 04/29/2020  Falls in the past year? 0 0 0 0 0   Number falls in past yr: 0 0 0 0 -  Injury with Fall? 0 0 0 0 -  Risk for fall due to : No Fall Risks - - - -  Follow up Falls prevention discussed - - - -  Functional Status Survey: Is the patient deaf or have difficulty hearing?: Yes Does the patient have difficulty seeing, even when wearing glasses/contacts?: No Does the patient have difficulty concentrating, remembering, or making decisions?: Yes Does the patient have difficulty walking or climbing stairs?: No Does the patient have difficulty dressing or bathing?: No Does the patient have difficulty doing errands alone such as visiting a doctor's office or shopping?: No    Assessment & Plan  1. Diet-controlled diabetes mellitus ([place])  - POCT HgB A1C - Microalbumin / creatinine urine ratio  2. Need for immunization against influenza  - Flu Vaccine QUAD 6+ mos PF IM (Fluarix Quad PF)  3. Vitamin D deficiency  - VITAMIN D 25 Hydroxy (Vit-D Deficiency, Fractures)  4. B12 deficiency  - CBC with Differential/Platelet - Vitamin B12  5. Atherosclerosis of aorta ([facility])  - Lipid panel  6. Fibromyalgia   7. Migraine with aura and without status migrainosus, not intractable  Keep follow up with Dr. Domingo Cocking   8. History of thyroiditis   9. Major depression in remission [facility])  Keep follow up with Dr. Nicolasa Ducking  10. Mild persistent asthma without complication   11. History of bariatric surgery   12. Long-term use of high-risk medication  - COMPLETE METABOLIC PANEL WITH GFR

## 2021-01-06 ENCOUNTER — Ambulatory Visit (INDEPENDENT_AMBULATORY_CARE_PROVIDER_SITE_OTHER): Payer: PRIVATE HEALTH INSURANCE | Admitting: Family Medicine

## 2021-01-06 ENCOUNTER — Other Ambulatory Visit: Payer: Self-pay

## 2021-01-06 ENCOUNTER — Other Ambulatory Visit: Payer: Self-pay | Admitting: Family Medicine

## 2021-01-06 VITALS — BP 132/80 | HR 101 | Temp 98.0°F | Resp 16 | Ht 61.0 in | Wt 200.0 lb

## 2021-01-06 DIAGNOSIS — Z79899 Other long term (current) drug therapy: Secondary | ICD-10-CM

## 2021-01-06 DIAGNOSIS — G43109 Migraine with aura, not intractable, without status migrainosus: Secondary | ICD-10-CM

## 2021-01-06 DIAGNOSIS — J4530 Mild persistent asthma, uncomplicated: Secondary | ICD-10-CM

## 2021-01-06 DIAGNOSIS — E538 Deficiency of other specified B group vitamins: Secondary | ICD-10-CM

## 2021-01-06 DIAGNOSIS — I700 Atherosclerosis of aorta: Secondary | ICD-10-CM

## 2021-01-06 DIAGNOSIS — Z23 Encounter for immunization: Secondary | ICD-10-CM | POA: Diagnosis not present

## 2021-01-06 DIAGNOSIS — F325 Major depressive disorder, single episode, in full remission: Secondary | ICD-10-CM

## 2021-01-06 DIAGNOSIS — E119 Type 2 diabetes mellitus without complications: Secondary | ICD-10-CM

## 2021-01-06 DIAGNOSIS — Z8639 Personal history of other endocrine, nutritional and metabolic disease: Secondary | ICD-10-CM

## 2021-01-06 DIAGNOSIS — M797 Fibromyalgia: Secondary | ICD-10-CM

## 2021-01-06 DIAGNOSIS — E559 Vitamin D deficiency, unspecified: Secondary | ICD-10-CM | POA: Diagnosis not present

## 2021-01-06 DIAGNOSIS — Z9884 Bariatric surgery status: Secondary | ICD-10-CM

## 2021-01-06 LAB — POCT GLYCOSYLATED HEMOGLOBIN (HGB A1C): Hemoglobin A1C: 5.1 % (ref 4.0–5.6)

## 2021-01-06 MED ORDER — CYANOCOBALAMIN 1000 MCG/ML IJ SOLN
1000.0000 ug | Freq: Once | INTRAMUSCULAR | Status: AC
Start: 2021-01-06 — End: 2021-01-06
  Administered 2021-01-06: 1000 ug via INTRAMUSCULAR

## 2021-01-07 ENCOUNTER — Encounter: Payer: Self-pay | Admitting: Family Medicine

## 2021-01-07 LAB — VITAMIN D 25 HYDROXY (VIT D DEFICIENCY, FRACTURES): Vit D, 25-Hydroxy: 40 ng/mL (ref 30–100)

## 2021-01-07 LAB — LIPID PANEL
Cholesterol: 183 mg/dL (ref <200)
HDL: 61 mg/dL (ref >=50)
LDL Cholesterol (Calc): 95 mg/dL (calc)
Non-HDL Cholesterol (Calc): 122 mg/dL (calc) (ref <130)
Total CHOL/HDL Ratio: 3 (calc) (ref <5.0)
Triglycerides: 169 mg/dL — ABNORMAL HIGH (ref <150)

## 2021-01-07 LAB — MICROALBUMIN / CREATININE URINE RATIO
Creatinine, Urine: 65 mg/dL (ref 20–275)
Microalb, Ur: <0.2 mg/dL

## 2021-01-07 LAB — COMPLETE METABOLIC PANEL WITH GFR
AG Ratio: 1.5 (calc) (ref 1.0–2.5)
ALT: 97 U/L — ABNORMAL HIGH (ref 6–29)
AST: 71 U/L — ABNORMAL HIGH (ref 10–35)
Albumin: 4 g/dL (ref 3.6–5.1)
Alkaline phosphatase (APISO): 85 U/L (ref 31–125)
BUN: 9 mg/dL (ref 7–25)
CO2: 23 mmol/L (ref 20–32)
Calcium: 9.3 mg/dL (ref 8.6–10.2)
Chloride: 104 mmol/L (ref 98–110)
Creat: 0.62 mg/dL (ref 0.50–0.99)
Globulin: 2.6 g/dL (calc) (ref 1.9–3.7)
Glucose, Bld: 85 mg/dL (ref 65–99)
Potassium: 4.1 mmol/L (ref 3.5–5.3)
Sodium: 138 mmol/L (ref 135–146)
Total Bilirubin: 0.4 mg/dL (ref 0.2–1.2)
Total Protein: 6.6 g/dL (ref 6.1–8.1)
eGFR: 110 mL/min/{1.73_m2} (ref >=60)

## 2021-01-07 LAB — CBC WITH DIFFERENTIAL/PLATELET
Absolute Monocytes: 680 cells/uL (ref 200–950)
Basophils Absolute: 59 cells/uL (ref 0–200)
Basophils Relative: 0.7 %
Eosinophils Absolute: 302 cells/uL (ref 15–500)
Eosinophils Relative: 3.6 %
HCT: 43.7 % (ref 35.0–45.0)
Hemoglobin: 14.7 g/dL (ref 11.7–15.5)
Lymphs Abs: 1722 cells/uL (ref 850–3900)
MCH: 30.5 pg (ref 27.0–33.0)
MCHC: 33.6 g/dL (ref 32.0–36.0)
MCV: 90.7 fL (ref 80.0–100.0)
MPV: 10.8 fL (ref 7.5–12.5)
Monocytes Relative: 8.1 %
Neutro Abs: 5636 cells/uL (ref 1500–7800)
Neutrophils Relative %: 67.1 %
Platelets: 298 10*3/uL (ref 140–400)
RBC: 4.82 10*6/uL (ref 3.80–5.10)
RDW: 12.9 % (ref 11.0–15.0)
Total Lymphocyte: 20.5 %
WBC: 8.4 10*3/uL (ref 3.8–10.8)

## 2021-01-07 LAB — VITAMIN B12: Vitamin B-12: 293 pg/mL (ref 200–1100)

## 2021-01-08 ENCOUNTER — Encounter: Payer: Self-pay | Admitting: Family Medicine

## 2021-01-09 ENCOUNTER — Other Ambulatory Visit: Payer: Self-pay | Admitting: Family Medicine

## 2021-01-09 MED ORDER — TRIAMCINOLONE ACETONIDE 0.1 % EX CREA: 1.0000 "application " | TOPICAL_CREAM | Freq: Two times a day (BID) | CUTANEOUS | 0 refills | Status: DC

## 2021-01-10 ENCOUNTER — Other Ambulatory Visit: Payer: Self-pay | Admitting: Family Medicine

## 2021-01-10 DIAGNOSIS — E559 Vitamin D deficiency, unspecified: Secondary | ICD-10-CM

## 2021-01-20 ENCOUNTER — Encounter: Payer: Self-pay | Admitting: Family Medicine

## 2021-02-02 ENCOUNTER — Ambulatory Visit: Payer: PRIVATE HEALTH INSURANCE | Admitting: Dietician

## 2021-02-08 ENCOUNTER — Other Ambulatory Visit: Payer: Self-pay | Admitting: Family Medicine

## 2021-02-08 DIAGNOSIS — M797 Fibromyalgia: Secondary | ICD-10-CM

## 2021-02-09 ENCOUNTER — Telehealth: Payer: Self-pay

## 2021-02-09 NOTE — Telephone Encounter (Signed)
Left message to clarify sig for Lyrica before refill

## 2021-02-09 NOTE — Telephone Encounter (Signed)
Pt states she takes more than 3 pills a day every other day. Pt believes is not enough of what she is taking so she has to take sometimes more than usual cause of her pain. Pt would like to know if you could increase her dose?

## 2021-02-14 ENCOUNTER — Other Ambulatory Visit: Payer: Self-pay | Admitting: Family Medicine

## 2021-02-14 DIAGNOSIS — E538 Deficiency of other specified B group vitamins: Secondary | ICD-10-CM

## 2021-02-15 ENCOUNTER — Other Ambulatory Visit: Payer: Self-pay

## 2021-02-15 DIAGNOSIS — E538 Deficiency of other specified B group vitamins: Secondary | ICD-10-CM

## 2021-02-15 MED ORDER — CYANOCOBALAMIN 1000 MCG/ML IJ SOLN: 1000.0000 ug | INTRAMUSCULAR | 1 refills | Status: DC

## 2021-02-15 NOTE — Telephone Encounter (Signed)
Requested medication (s) are due for refill today:   Yes  Requested medication (s) are on the active medication list:   Yes  Future visit scheduled:   Yes   Last ordered: 07/15/2020 6 ml, 1 refill  Returned because no protocol assigned to this medication.   Requested Prescriptions  Pending Prescriptions Disp Refills   cyanocobalamin (,VITAMIN B-12,) 1000 MCG/ML injection [Pharmacy Med Name: CYANOCOBALAMIN 1,000 MCG/ML VL] 2 mL 5    Sig: INJECT 1 ML (1,000 MCG TOTAL) INTO THE MUSCLE EVERY 14 (FOURTEEN) DAYS.     Off-Protocol Failed - 02/14/2021  7:16 PM      Failed - Medication not assigned to a protocol, review manually.      Passed - Valid encounter within last 12 months    Recent Outpatient Visits           1 month ago Diet-controlled diabetes mellitus [facility])   [facility] Steele Sizer, MD   3 months ago Urticaria   [facility] Kathrine Haddock, NP   7 months ago Diet-controlled diabetes mellitus [facility])   [facility] Steele Sizer, MD   8 months ago [facility] Steele Sizer, MD   1 year ago Diet-controlled diabetes mellitus [facility])   [facility] Steele Sizer, MD       Future Appointments             In 4 months Steele Sizer, MD [facility], [facility]           Off-Protocol Failed - 02/14/2021  7:16 PM      Failed - Medication not assigned to a protocol, review manually.      Passed - Valid encounter within last 12 months    Recent Outpatient Visits           1 month ago Diet-controlled diabetes mellitus [facility])   [facility] Steele Sizer, MD   3 months ago Urticaria   [place], NP   7 months ago Diet-controlled diabetes mellitus [facility])   [facility] Steele Sizer, MD   8 months ago [facility] Steele Sizer, MD   1  year ago Diet-controlled diabetes mellitus [facility])   [facility] Steele Sizer, MD       Future Appointments             In 4 months Ancil Boozer, Drue Stager, MD [facility], [facility]

## 2021-03-07 ENCOUNTER — Encounter: Payer: Self-pay | Admitting: Family Medicine

## 2021-03-09 ENCOUNTER — Other Ambulatory Visit (HOSPITAL_COMMUNITY): Payer: Self-pay | Admitting: Neurology

## 2021-03-09 ENCOUNTER — Other Ambulatory Visit: Payer: Self-pay | Admitting: Neurology

## 2021-03-09 DIAGNOSIS — R253 Fasciculation: Secondary | ICD-10-CM

## 2021-03-09 DIAGNOSIS — R251 Tremor, unspecified: Secondary | ICD-10-CM

## 2021-03-11 ENCOUNTER — Other Ambulatory Visit: Payer: Self-pay | Admitting: Family Medicine

## 2021-03-11 DIAGNOSIS — M797 Fibromyalgia: Secondary | ICD-10-CM

## 2021-03-11 NOTE — Telephone Encounter (Signed)
Requested medication (s) are due for refill today: yes  Requested medication (s) are on the active medication list: yes  Last refill:  02/10/21 #60  Future visit scheduled: yes  Notes to clinic:  med not delegated to NT to RF   Requested Prescriptions  Pending Prescriptions Disp Refills   pregabalin (LYRICA) 300 MG capsule [Pharmacy Med Name: PREGABALIN 300 MG CAPSULE] 60 capsule 0    Sig: TAKE 1 CAPSULE BY MOUTH TWICE A DAY     Not Delegated - Neurology:  Anticonvulsants - Controlled Failed - 03/11/2021 12:52 PM      Failed - This refill cannot be delegated      Passed - Valid encounter within last 12 months    Recent Outpatient Visits           2 months ago Diet-controlled diabetes mellitus [facility])   [facility] Steele Sizer, MD   3 months ago Urticaria   [place], NP   8 months ago Diet-controlled diabetes mellitus [facility])   [facility] Steele Sizer, MD   8 months ago [facility] Steele Sizer, MD   1 year ago Diet-controlled diabetes mellitus [facility])   [facility] Steele Sizer, MD       Future Appointments             In 3 months Ancil Boozer, Drue Stager, MD [facility], [facility]

## 2021-03-16 ENCOUNTER — Other Ambulatory Visit: Payer: Self-pay | Admitting: Family Medicine

## 2021-03-16 DIAGNOSIS — J4520 Mild intermittent asthma, uncomplicated: Secondary | ICD-10-CM

## 2021-03-17 ENCOUNTER — Ambulatory Visit
Admission: RE | Admit: 2021-03-17 | Discharge: 2021-03-17 | Disposition: A | Discharge status: Home / Self Care | Payer: PRIVATE HEALTH INSURANCE | Source: Ambulatory Visit | Attending: Neurology | Admitting: Neurology

## 2021-03-17 DIAGNOSIS — R251 Tremor, unspecified: Secondary | ICD-10-CM | POA: Diagnosis present

## 2021-03-17 DIAGNOSIS — R253 Fasciculation: Secondary | ICD-10-CM | POA: Diagnosis present

## 2021-03-17 MED ORDER — GADOBUTROL 1 MMOL/ML IV SOLN
9.0000 mL | Freq: Once | INTRAVENOUS | Status: AC | PRN
  Administered 2021-03-17: 10 mL via INTRAVENOUS

## 2021-04-06 ENCOUNTER — Encounter: Payer: Self-pay | Admitting: Family Medicine

## 2021-04-06 ENCOUNTER — Other Ambulatory Visit: Payer: Self-pay

## 2021-04-10 ENCOUNTER — Telehealth: Payer: Self-pay | Admitting: Cardiology

## 2021-04-10 NOTE — Telephone Encounter (Signed)
LVM to schedule

## 2021-04-10 NOTE — Telephone Encounter (Signed)
-----   Message from Horton Finer sent at 04/10/2021  1:09 PM EST ----- Regarding: needs appt Please schedule overdue F/U appt for refills. Thank you!

## 2021-04-13 ENCOUNTER — Other Ambulatory Visit: Payer: Self-pay

## 2021-04-13 MED ORDER — ATORVASTATIN CALCIUM 40 MG PO TABS: 40.0000 mg | ORAL_TABLET | Freq: Every day | ORAL | 11 refills | Status: DC

## 2021-05-15 ENCOUNTER — Ambulatory Visit: Payer: PRIVATE HEALTH INSURANCE | Admitting: Medical

## 2021-05-15 NOTE — Progress Notes (Deleted)
?Cardiology Office Note:   ? ?Date:  05/15/2021  ? ?ID:  Claire [name], DOB 05/31/1970, MRN [id number] ? ?PCP:  Sowles, Krichna, MD  ?[facility] Cardiologist:  Brian Agbor-Etang, MD  ?[facility] Electrophysiologist:  None  ? ?Referring MD: Sowles, Krichna, MD  ? ?Chief Complaint: 6 month follow-up ? ?History of Present Illness:   ? ?Claire [name] is a 50 y.o. female with a hx of CAD (nonobstructive 25%LAD), diabetes, depression, HLD, fmiay h/o CAD who presents for follow-up. ? ?Echo in 07/2019 showed normal systolic and diastolic function. EF 60-65%. Coronary CTA 08/2019 showed calcium score of 175, mid LAD disease 25%.  ? ?She ws last seen 03/14/20 and was overall doing well, had some weight gain with the pandemic. Lipitor was increased to 40mg daily.  ? ?Today ? ?Lipid panel ? ?Past Medical History:  ?Diagnosis Date  ? Anemia   ? Anxiety   ? Arthritis   ? hands, hips  ? Asthma   ? Bilateral leg cramps   ? Depression   ? Diabetes mellitus without complication (HCC)   ? history no longer a problem since weight loss surgery  ? DJD (degenerative joint disease)   ? GERD (gastroesophageal reflux disease)   ? Headache   ? Migraines  ? IBS (irritable bowel syndrome)   ? Kidney stone   ? Low serum vitamin B12   ? Lung nodules   ? Migraine   ? down to approx 4x/mo since starting meds  ? Muscle spasm   ? Psoriasis   ? vaginal area  ? Seasonal allergies   ? Sleep apnea   ? history no longer a problem since weight loss surgery  ? Vasovagal syncope   ? [place], Dr. Movark   ? Vitamin D deficiency   ? ? ?Past Surgical History:  ?Procedure Laterality Date  ? BLADDER SURGERY  2010  ? BREAST BIOPSY Right 2014  ? stereotatic biopsy  ? CHOLECYSTECTOMY  2010  ? COLONOSCOPY  2014   ? Done at [place]  ? COLONOSCOPY WITH PROPOFOL N/A 01/28/2018  ? Procedure: COLONOSCOPY WITH PROPOFOL;  Surgeon: Tahiliani, Varnita B, MD;  Location: [facility];  Service: Endoscopy;  Laterality: N/A;  ? DILITATION &  CURRETTAGE/HYSTROSCOPY WITH NOVASURE ABLATION N/A 06/16/2015  ? Procedure: DILATATION & CURETTAGE/HYSTEROSCOPY WITH NOVASURE ABLATION;  Surgeon: Richard Taavon, MD;  Location: [facility];  Service: Gynecology;  Laterality: N/A;  ? ESOPHAGOGASTRODUODENOSCOPY (EGD) WITH PROPOFOL N/A 01/28/2018  ? Procedure: ESOPHAGOGASTRODUODENOSCOPY (EGD) WITH BIOPSIES;  Surgeon: Tahiliani, Varnita B, MD;  Location: [facility];  Service: Endoscopy;  Laterality: N/A;  ? GASTRIC ROUX-EN-Y N/A 11/03/2019  ? Procedure: LAPAROSCOPIC 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 WITH UPPER ENDOSCOPY;  Surgeon: Martin, Matthew, MD;  Location: [facility];  Service: General;  Laterality: N/A;  ? HIATAL HERNIA REPAIR N/A 11/03/2019  ? Procedure: HERNIA REPAIR HIATAL;  Surgeon: Martin, Matthew, MD;  Location: [facility];  Service: General;  Laterality: N/A;  ? LAPAROSCOPIC GASTRIC SLEEVE RESECTION  2012  ? LAPAROSCOPY N/A 06/16/2015  ? Procedure: LAPAROSCOPY DIAGNOSTIC;  Surgeon: Richard Taavon, MD;  Location: [facility];  Service: Gynecology;  Laterality: N/A;  ? LAPAROSCOPY N/A 04/01/2018  ? Procedure: LAPAROSCOPY DIAGNOSTIC ERAS PATHWAY ENTEROLYSIS, CECOPEXY;  Surgeon: Martin, Matthew, MD;  Location: [facility];  Service: General;  Laterality: N/A;  ? LITHOTRIPSY  12/11/2017  ? laser lithotripsy  ? LYSIS OF ADHESION N/A 06/16/2015  ? Procedure: LYSIS OF ADHESION;  Surgeon: Richard Taavon, MD;    Location: [facility];  Service: Gynecology;  Laterality: N/A;  ? PLANTAR FASCIA SURGERY Bilateral   ? ROBOTIC ASSISTED SALPINGO OOPHERECTOMY Right 06/16/2015  ? Procedure: ROBOTIC ASSISTED SALPINGO OOPHORECTOMY, EXCISION OF RIGHT CUL DE [place] MASS;  Surgeon: Brien Few, MD;  Location: [facility];  Service: Gynecology;  Laterality: Right;  ? TONSILLECTOMY    ? UPPER GI ENDOSCOPY    ? ? ?Current Medications: ?No outpatient medications have been marked as taking for the 05/15/21 encounter (Appointment) with Kathlen Mody, Samanda Buske H, PA-C.  ?  ? ?Allergies:    Aspartame and phenylalanine, Gabapentin, Glucose, Linzess [linaclotide], Other, Cymbalta [duloxetine hcl], Tape, and Tapentadol  ? ?Social History  ? ?Socioeconomic History  ? Marital status: Married  ?  Spouse name: Merrilee Seashore   ? Number of children: 3  ? Years of education: Not on file  ? Highest education level: Some college, no degree  ?Occupational History  ? Occupation: unemployed   ?Tobacco Use  ? Smoking status: Former  ?  Packs/day: 1.00  ?  Years: 10.00  ?  Pack years: 10.00  ?  Types: Cigarettes  ?  Quit date: 02/19/1993  ?  Years since quitting: 28.2  ? Smokeless tobacco: Never  ?Vaping Use  ? Vaping Use: Never used  ?Substance and Sexual Activity  ? Alcohol use: Yes  ?  Alcohol/week: 0.0 standard drinks  ?  Comment: occasionally  ? Drug use: No  ? Sexual activity: Yes  ?  Partners: Male  ?  Comment: last sex 04 Jan 2018  ?Other Topics Concern  ? Not on file  ?Social History Narrative  ? 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 ?Social Determinants of Health  ? ?Financial Resource Strain: Not on file  ?Food Insecurity: Not on file  ?Transportation Needs: Not on file  ?Physical Activity: Not on file  ?Stress: Not on file  ?Social Connections: Not on file  ?  ? ?Family History: ?The patient's ***family history includes Breast cancer in her paternal aunt and paternal grandmother; Heart attack in her maternal uncle, mother, paternal grandfather, and sister; Hyperlipidemia in her father; Lung cancer in her maternal grandfather; Multiple myeloma in her mother; Stroke in her mother. ? ?ROS:   ?Please see the history of present illness.    ?*** All other systems reviewed and are negative. ? ?EKGs/Labs/Other Studies Reviewed:   ? ?The following studies were reviewed today: ?*** ? ?EKG:  EKG is *** ordered today.  The ekg ordered today demonstrates *** ? ?Recent Labs: ?01/06/2021: ALT 97; BUN 9; Creat 0.62; Hemoglobin 14.7;  Platelets 298; Potassium 4.1; Sodium 138  ?Recent Lipid Panel ?   ?Component Value Date/Time  ? CHOL 183 01/06/2021 1428  ? CHOL 179 12/31/2018 0857  ? TRIG 169 (H) 01/06/2021 1428  ? HDL 61 01/06/2021 1428  ? HDL 64 12/31/2018 0857  ? CHOLHDL 3.0 01/06/2021 1428  ? VLDL 20 11/11/2015 0846  ? [place] 95 01/06/2021 1428  ? ? ? ?Risk Assessment/Calculations:   ?{Does this patient have ATRIAL FIBRILLATION?:[phone]} ? ? ?Physical Exam:   ? ?VS:  There were no vitals taken for this visit.   ? ?Wt Readings from Last 3 Encounters:  ?01/06/21 200 lb (90.7 kg)  ?11/17/20 196 lb 4.8 oz (89 kg)  ?11/03/20 194 lb 3.2 oz (88.1 kg)  ?  ? ?GEN: *** Well nourished, well developed in no acute distress ?HEENT: Normal ?  NECK: No JVD; No carotid bruits ?LYMPHATICS: No lymphadenopathy ?CARDIAC: ***RRR, no murmurs, rubs, gallops ?RESPIRATORY:  Clear to auscultation without rales, wheezing or rhonchi  ?ABDOMEN: Soft, non-tender, non-distended ?MUSCULOSKELETAL:  No edema; No deformity  ?SKIN: Warm and dry ?NEUROLOGIC:  Alert and oriented x 3 ?PSYCHIATRIC:  Normal affect  ? ?ASSESSMENT:   ? ?No diagnosis found. ?PLAN:   ? ?In order of problems listed above: ? ?*** ? ?Disposition: Follow up {follow up:15908} with ***  ? ?Shared Decision Making/Informed Consent   ?{Are you ordering a CV Procedure (e.g. stress test, cath, DCCV, TEE, etc)?   Press F2        :[id number]}  ? ? ?Signed, ?Cadence H Furth, PA-C  ?05/15/2021 7:00 AM    ?[facility]  ?

## 2021-05-19 ENCOUNTER — Other Ambulatory Visit: Payer: Self-pay

## 2021-05-19 ENCOUNTER — Telehealth (INDEPENDENT_AMBULATORY_CARE_PROVIDER_SITE_OTHER): Payer: PRIVATE HEALTH INSURANCE | Admitting: Internal Medicine

## 2021-05-19 ENCOUNTER — Encounter: Payer: Self-pay | Admitting: Family Medicine

## 2021-05-19 ENCOUNTER — Encounter: Payer: Self-pay | Admitting: Internal Medicine

## 2021-05-19 DIAGNOSIS — U071 COVID-19: Secondary | ICD-10-CM | POA: Diagnosis not present

## 2021-05-19 DIAGNOSIS — R0981 Nasal congestion: Secondary | ICD-10-CM | POA: Diagnosis not present

## 2021-05-19 DIAGNOSIS — R051 Acute cough: Secondary | ICD-10-CM | POA: Diagnosis not present

## 2021-05-19 MED ORDER — TRIAMCINOLONE ACETONIDE 55 MCG/ACT NA AERO
2.0000 | INHALATION_SPRAY | Freq: Every day | NASAL | 12 refills | Status: DC
  Filled 2021-05-19: qty 1, 1d supply, fill #0

## 2021-05-19 MED ORDER — NIRMATRELVIR/RITONAVIR (PAXLOVID)TABLET
3.0000 | ORAL_TABLET | Freq: Two times a day (BID) | ORAL | 0 refills | Status: AC
  Filled 2021-05-19: qty 30, 5d supply, fill #0

## 2021-05-19 MED ORDER — BENZONATATE 100 MG PO CAPS
100.0000 mg | ORAL_CAPSULE | Freq: Two times a day (BID) | ORAL | 0 refills | Status: DC | PRN
  Filled 2021-05-19: qty 20, 10d supply, fill #0

## 2021-05-19 NOTE — Progress Notes (Signed)
Virtual Visit via Video Note ? ?I connected with Claire [name] on 05/19/21 at  3:40 PM EDT by a video enabled telemedicine application and verified that I am speaking with the correct person using two identifiers. ? ?Location: ?Patient: Home ?Provider: [facility] ?  ?I discussed the limitations of evaluation and management by telemedicine and the availability of in person appointments. The patient expressed understanding and agreed to proceed. ? ?History of Present Illness: ? ?Claire [name] is a 51 year old female presenting via telemedicine after having a positive home COVID test on 05/17/21. Symptoms began about 4 days. Patient's husband tested positive 5 days ago.  ? ?-Worst symptom: congestion and cough  ?-Fever: no not right now, 4 days ago was 100.0 ?-Cough: yes, mostly dry  ?-Shortness of breath: no ?-Wheezing: no ?-Chest pain: no ?-Chest tightness: no ?-Chest congestion: yes ?-Nasal congestion: yes ?-Runny nose: yes ?-Post nasal drip: yes ?-Sneezing: yes ?-Sore throat: yes ?-Sinus pressure: yes ?-Headache: no ?-Ear pain: yes "right ?-Ear pressure: yes bilateral ?-Vomiting: no ?-Fatigue: yes ?-Sick contacts: yes ?-Context: fluctuating ?-Relief with OTC cold/cough medications: no  ?-Treatments attempted: Taking Ibuprofen, Nightquil.Dayquil, Mucniex DM ?-Home oxygenation monitor 95% has been the norm, lowest 92% with walking but will come back up  ?-History of having to be hospitalized and needing supplemental O2 2 years ago when COVID positive ?-Up to date with COVID vaccines  ? ?  ?Observations/Objective: ? ?General: well appearing, no acute distress ?ENT: conjunctiva normal appearing bilaterally, nasal congestion   ?Skin: no rashes, cyanosis or abnormal bruising noted ?Neuro: answers all questions appropriately  ? ? ?Assessment and Plan: ? ?1. COVID-19/Acute cough/Nasal congestion: Treat with anti-virals, otherwise treat symptomatically with cough suppressants, nasal steroid. Continue to monitor oxygenation,  discussed circumstances to go to the emergency room. Follow up if symptoms worsen or fail to improve.  ? ?- nirmatrelvir/ritonavir EUA (PAXLOVID) 20 x 150 MG & 10 x '100MG'$  TABS; Take 3 tablets by mouth 2 (two) times daily for 5 days. (Take nirmatrelvir 150 mg two tablets twice daily for 5 days and ritonavir 100 mg one tablet twice daily for 5 days) Patient GFR is 110  Dispense: 30 tablet; Refill: 0 ?- benzonatate (TESSALON) 100 MG capsule; Take 1 capsule (100 mg total) by mouth 2 (two) times daily as needed for cough.  Dispense: 20 capsule; Refill: 0 ?- triamcinolone (NASACORT ALLERGY 24HR) 55 MCG/ACT AERO nasal inhaler; Place 2 sprays into the nose daily.  Dispense: 1 each; Refill: 12 ? ? ?Follow Up Instructions: PRN ? ?  ?I discussed the assessment and treatment plan with the patient. The patient was provided an opportunity to ask questions and all were answered. The patient agreed with the plan and demonstrated an understanding of the instructions. ?  ?The patient was advised to call back or seek an in-person evaluation if the symptoms worsen or if the condition fails to improve as anticipated. ? ?I provided 19 minutes of non-face-to-face time during this encounter. ? ? ?Teodora Medici, DO ? ?

## 2021-05-25 ENCOUNTER — Encounter (HOSPITAL_COMMUNITY): Payer: Self-pay | Admitting: *Deleted

## 2021-06-05 ENCOUNTER — Encounter: Payer: Self-pay | Admitting: Family Medicine

## 2021-06-11 ENCOUNTER — Other Ambulatory Visit: Payer: Self-pay | Admitting: Family Medicine

## 2021-06-11 DIAGNOSIS — M797 Fibromyalgia: Secondary | ICD-10-CM

## 2021-06-28 ENCOUNTER — Other Ambulatory Visit: Payer: Self-pay | Admitting: Family Medicine

## 2021-06-28 DIAGNOSIS — E559 Vitamin D deficiency, unspecified: Secondary | ICD-10-CM

## 2021-07-04 ENCOUNTER — Encounter: Payer: Self-pay | Admitting: Nurse Practitioner

## 2021-07-04 ENCOUNTER — Ambulatory Visit (INDEPENDENT_AMBULATORY_CARE_PROVIDER_SITE_OTHER): Payer: PRIVATE HEALTH INSURANCE | Admitting: Nurse Practitioner

## 2021-07-04 VITALS — BP 120/70 | HR 89 | Ht 61.0 in | Wt 216.0 lb

## 2021-07-04 DIAGNOSIS — I2510 Atherosclerotic heart disease of native coronary artery without angina pectoris: Secondary | ICD-10-CM | POA: Diagnosis not present

## 2021-07-04 DIAGNOSIS — E782 Mixed hyperlipidemia: Secondary | ICD-10-CM

## 2021-07-04 NOTE — Progress Notes (Signed)
? ? ?Office Visit  ?  ?Patient Name: Claire [name] ?Date of Encounter: 07/04/2021 ? ?Primary Care Provider:  Steele Sizer, MD ?Primary Cardiologist:  Kate Sable, MD ? ?Chief Complaint  ?  ?51-year-old female with above past medical history including nonobstructive CAD, diabetes, hyperlipidemia, neurocardiogenic syncope/dysautonomia, migraines, anxiety, depression, OCD, fibromyalgia, obesity status post gastric bypass, GERD, and diverticulosis, who presents for follow-up related to chest pain and dyspnea. ? ?Past Medical History  ?  ?Past Medical History:  ?Diagnosis Date  ? Anemia   ? Anxiety   ? Arthritis   ? hands, hips  ? Asthma   ? Bilateral leg cramps   ? Depression   ? Diabetes mellitus without complication ([place])   ? history no longer a problem since weight loss surgery  ? DJD (degenerative joint disease)   ? Dyspnea on exertion   ? a. 07/2019 Echo: EF 60-65%, no rwma, nl RV fxn, triv MR.  ? GERD (gastroesophageal reflux disease)   ? Headache   ? Migraines  ? IBS (irritable bowel syndrome)   ? Kidney stone   ? Low serum vitamin B12   ? Lung nodules   ? Migraine   ? down to approx 4x/mo since starting meds  ? Muscle spasm   ? Nonobstructive CAD (coronary artery disease)   ? a. 06/2014 ETT: Ex time 8:59, max HR 155, 10.1 METS, no ST/T changes; b. 08/2019 Cor CTA: Ca2+ 175 (99th %'ile), LAD 35-49p, LCX 0-68m->Med rx.  ? Psoriasis   ? vaginal area  ? Seasonal allergies   ? Sleep apnea   ? history no longer a problem since weight loss surgery  ? Vasovagal syncope   ? [place], Dr. MAlveria Apley  ? Vitamin D deficiency   ? ?Past Surgical History:  ?Procedure Laterality Date  ? BLADDER SURGERY  2010  ? BREAST BIOPSY Right 2014  ? stereotatic biopsy  ? CHOLECYSTECTOMY  2010  ? [facility]  2014   ? [facility] ? COLONOSCOPY WITH PROPOFOL N/A 01/28/2018  ? Procedure: COLONOSCOPY WITH PROPOFOL;  Surgeon: TVirgel Manifold MD;  [place] [facility]  Service: Endoscopy;  Laterality: N/A;  ?  DILITATION & CURRETTAGE/HYSTROSCOPY WITH NOVASURE ABLATION N/A 06/16/2015  ? Procedure: DILATATION & CURETTAGE/HYSTEROSCOPY WITH NOVASURE ABLATION;  Surgeon: RBrien Few MD;  [facility];  Service: Gynecology;  Laterality: N/A;  ? ESOPHAGOGASTRODUODENOSCOPY (EGD) WITH PROPOFOL N/A 01/28/2018  ? Procedure: ESOPHAGOGASTRODUODENOSCOPY (EGD) WITH BIOPSIES;  Surgeon: TVirgel Manifold MD;  [place] [facility]  Service: Endoscopy;  Laterality: N/A;  ? GASTRIC ROUX-EN-Y N/A 11/03/2019  ? Procedure: LAPAROSCOPIC 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 WITH UPPER ENDOSCOPY;  Surgeon: MJohnathan Hausen MD;  [facility];  Service: General;  Laterality: N/A;  ? HIATAL HERNIA REPAIR N/A 11/03/2019  ? Procedure: HERNIA REPAIR HIATAL;  Surgeon: MJohnathan Hausen MD;  [facility];  Service: General;  Laterality: N/A;  ? [place]  [place]  ? LAPAROSCOPY N/A 06/16/2015  ? Procedure: LAPAROSCOPY DIAGNOSTIC;  Surgeon: RBrien Few MD;  [facility];  Service: Gynecology;  Laterality: N/A;  ? LAPAROSCOPY N/A 04/01/2018  ? Procedure: LAPAROSCOPY DIAGNOSTIC ERAS PATHWAY ENTEROLYSIS, CECOPEXY;  Surgeon: MJohnathan Hausen MD;  [facility];  Service: General;  Laterality: N/A;  ? LITHOTRIPSY  12/11/2017  ? laser lithotripsy  ? LYSIS OF ADHESION N/A 06/16/2015  ? Procedure: LYSIS OF ADHESION;  Surgeon: RBrien Few MD;  [facility];  Service: Gynecology;  Laterality: N/A;  ? PLANTAR FASCIA SURGERY Bilateral   ? ROBOTIC ASSISTED SALPINGO OOPHERECTOMY Right 06/16/2015  ? Procedure: ROBOTIC ASSISTED SALPINGO OOPHORECTOMY, EXCISION OF RIGHT CUL DE [place] MASS;  Surgeon: Brien Few, MD;  Location: [facility];  Service: Gynecology;  Laterality: Right;  ? TONSILLECTOMY    ? UPPER GI ENDOSCOPY    ? ? ?Allergies ? ?Allergies  ?Allergen Reactions  ? Aspartame And Phenylalanine Nausea Only and Other (See Comments)  ?  GI upset   ? Gabapentin   ?  Bladder  incontinence at higher dose  ? Glucose Nausea Only and Nausea And Vomiting  ? Linzess [Linaclotide]   ?  Worsening of diarrhea  ? Other Hives and Other (See Comments)  ?  Ricotta Cheese: flushed and hot  ? Cymbalta [Duloxetine Hcl] Palpitations  ?  And photosensitivity  ? Tape Rash  ?  Some clear tapes  ? Tapentadol Rash  ?  Some clear tapes  ? ? ?History of Present Illness  ?  ?51 year old female with above past medical history including nonobstructive CAD, diabetes, hyperlipidemia, neurocardiogenic syncope/dysautonomia, migraines, anxiety, depression, OCD, fibromyalgia, obesity status post gastric bypass, GERD, and diverticulosis.  She previously underwent exercise treadmill testing in May 2016, which was normal.  She started experiencing presyncope and syncope in October 2019, and was referred to [facility].  Findings were felt to be most consistent with neurocardiogenic syncope/dysautonomia.  She subsequently established care here in June 2021 in the setting of ongoing dyspnea exertion following COVID infection, systolic murmur on examination, and frequent PACs noted on ECG.  Echocardiogram August 05, 2019 showed normal LV systolic and diastolic function and trivial MR.  Coronary CT angiogram was performed and showed a calcium score of 175 (99th percentile), with mild nonobstructive LAD and circumflex disease.  She has been managed with aspirin and statin therapy. ? ?Patient was last seen in cardiology clinic in January 2022, at which time she was doing well.  She notes that over the past year, in the setting of inactivity and recurrent COVID infection, she has gained weight.  This has impacted her depression, resulting in adjustment of medications, which she feels has resulted in additional weight gain and also potentially development of more GERD symptoms.  From a cardiac standpoint, she has some degree of dyspnea on exertion, which has been stable and she attributes to deconditioning.  She has been making changes to  her diet to remedy some of her weight gain and recognizes that she needs to be more active.  She did have 1 episode of chest discomfort a few weeks ago that occurred while lying down.  She was not sure if this was more GERD in nature but symptoms did resolve after sitting up.  She denies palpitations, PND, orthopnea, dizziness, syncope, or early satiety.  She sometimes notes lower extremity swelling at the end of the day. ? ?Home Medications  ?  ?Current Outpatient Medications  ?Medication Sig Dispense Refill  ? Accu-Chek FastClix Lancets MISC USE TO TEST UP TO 4 TIMES A DAY    ? ACCU-CHEK GUIDE test strip     ? ADDERALL XR 30 MG 24 hr capsule Take 30 mg by mouth daily.     ? albuterol (VENTOLIN HFA) 108 (90 Base) MCG/ACT inhaler TAKE 2 PUFFS EVERY 6 HOURS AS NEEDED FOR WHEEZING OR SHORTNESS OF BREATH. (VENTOLIN NOT COVERED) 8.5 each 2  ? aspirin EC 81 MG tablet Take 1 tablet (81 mg total) by mouth daily. Swallow whole. 90 tablet  3  ? atorvastatin (LIPITOR) 40 MG tablet Take 1 tablet (40 mg total) by mouth daily. 30 tablet 11  ? B-D 3CC LUER-LOK SYR 25GX1" 25G X 1" 3 ML MISC     ? Calcium Citrate 1040 MG TABS Take by mouth.    ? calcium citrate-vitamin D (CALCIUM CITRATE CHEWY BITE) 500-500 MG-UNIT chewable tablet Chew 1 tablet by mouth 3 (three) times daily.    ? clobetasol cream (TEMOVATE) 0.05 % SMARTSIG:Sparingly Topical Twice Daily    ? clobetasol ointment (TEMOVATE) 5.88 % Apply 1 application topically daily as needed (psoriasis).   0  ? cyanocobalamin (,VITAMIN B-12,) 1000 MCG/ML injection Inject 1 mL (1,000 mcg total) into the muscle every 14 (fourteen) days. 6 mL 1  ? cyclobenzaprine (FLEXERIL) 10 MG tablet Take 10 mg by mouth at bedtime as needed for muscle spasms.    ? fluocinonide (LIDEX) 0.05 % external solution Apply 1 application topically 2 (two) times daily as needed (psoriasis).     ? gentamicin cream (GARAMYCIN) 0.1 % Apply 1 application topically 2 (two) times daily. 30 g 1  ? IRON-VITAMIN C PO  Take 1 tablet by mouth daily.    ? ketoconazole (NIZORAL) 2 % shampoo Apply 1 application topically 2 (two) times daily as needed (psoriasis).     ? Lactobacillus (ACIDOPHILUS PO) Take 1 capsule by mouth daily. Pr

## 2021-07-04 NOTE — Patient Instructions (Signed)
Medication Instructions:  ?No changes at this time.  ? ?*If you need a refill on your cardiac medications before your next appointment, please call your pharmacy* ? ? ?Lab Work: ?None ? ?If you have labs (blood work) drawn today and your tests are completely normal, you will receive your results only by: ?MyChart Message (if you have MyChart) OR ?A paper copy in the mail ?If you have any lab test that is abnormal or we need to change your treatment, we will call you to review the results. ? ? ?Testing/Procedures: ?None ? ? ? ?Follow-Up: ?At [facility], you and your health needs are our priority.  As part of our continuing mission to provide you with exceptional heart care, we have created designated Provider Care Teams.  These Care Teams include your primary Cardiologist (physician) and Advanced Practice Providers (APPs -  Physician Assistants and Nurse Practitioners) who all work together to provide you with the care you need, when you need it. ? ? ?Your next appointment:   ?1 year(s) ? ?The format for your next appointment:   ?In Person ? ?Provider:   ?Kate Sable, MD  ? ? ? ? ? ?Important Information About Sugar ? ? ? ? ?  ?

## 2021-07-07 ENCOUNTER — Telehealth (INDEPENDENT_AMBULATORY_CARE_PROVIDER_SITE_OTHER): Payer: PRIVATE HEALTH INSURANCE | Admitting: Family Medicine

## 2021-07-07 ENCOUNTER — Ambulatory Visit: Payer: PRIVATE HEALTH INSURANCE | Admitting: Family Medicine

## 2021-07-07 ENCOUNTER — Encounter: Payer: Self-pay | Admitting: Family Medicine

## 2021-07-07 NOTE — Progress Notes (Signed)
Rescheduled

## 2021-07-07 NOTE — Progress Notes (Deleted)
Name: Claire [name]   MRN: [id number]    DOB: Dec 18, 1970   Date:07/07/2021       Progress Note  Subjective  Chief Complaint  Follow up   HPI  Major Depression: she is now seeing Dr. Nicolasa Ducking who is also giving her therapy.  She is off Zolot and is currently on Trintelix 20 mg and off Lunesta and taking Trazodone for sleep and is sleeping well now .  She had side effects with Ambien ( interrupted sleep and sleep walking) ,  or Temazepam ( groggy ). She is following Dr. Waylan Boga recommendation. She has been unemployed but trying to find a job, but worried about her memory since COVID-19 in 21   , Dr. Nicolasa Ducking is giving her Adderal and seems to be helping with focus and concentration . Stable.  .  Asthma Mild Intermittentt: SOB since COVID but no wheezing or cough   Migraine: she is still seeing Dr. Domingo Cocking - she is on  Zonegran, also taking promethazine . She is doing better now, she has on average 1 migraine episodes per month, she has tension headaches intermittent . Stable    DMII: diagnosed at age 68, took medication for a while, but stopped all medications 10/25/2010 after bariatric surgery, glucose at home has been well controlled,  no polyphagia, polyuria or polydipsia. Maximum weight of 265 lbs and was down to 118.3 lbs - lowest weight back in 08/2017), she was gaining weight since she started to feel better after umbilical hernia repair with  lysis of adhesions and cecopexy back in 04/01/2018, she had a bariatric surgery revision 11/03/2019 by Dr. Hassell Done, at that time weight was 196 lb, and it went down to 184 lbs and is now gradually gaining it back, weight today is 200 lbs A1C has been normal, we will check urine micro    IBS/GERD history of bypass surgery:  She was doing well after  lysis of adhesions and cecopexy back in 04/01/2018. She  is feeling much better very seldom has dysphagia nor, no longer vomiting.    Atherosclerosis of aorta and hyperlipidemia: on statin therapy. We will  recheck labs    B12 deficiency: she has been getting B12 injections weekly since Nov and last level was at goal at 808. We will recheck labs today    Thyroiditis: she had a neck lump that decreased in size at the time T4 was high and TSH suppressed, however last level was normal 08/22   Dizziness/syncope: seen at [facility], and has seen Dr. Margarito Courser 03/2019 , they ruled out POTS but may have had vasovagal syncope. She is doing better, seeing cardiologist now   History of COVID: diagnosed 04/21  she still has mental fogginess, and feel tired , she also has decrease in exercise tolerance. She is no longer working ( lost her job due to the pandemic). She was having difficulty focusing when she was taking classes   FMS: she has been more compliant with Lyrica 100 mg TID  She states medication is helping .   Right thumb trigger finger: wakes up with right thumb stuck  Urticaria: it was going for months, and finally resolved when she used a topical medication but not sure of the name and she will send me the name later. 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/03/2019   B12 deficiency 04/10/2018   Columnar epithelial-lined lower esophagus    Postprocedural disorder of digestive system    Esophageal dysphagia    Generalized abdominal pain    Persistent mood (affective) disorder, unspecified ([place]) 01/15/2018   Insomnia related to another mental disorder 01/15/2018   OCD (obsessive compulsive disorder) 10/29/2017   GAD (generalized anxiety disorder) 10/29/2017   Nephrolithiasis 10/22/2017   Vasovagal syncope 09/05/2017   Atherosclerosis of aorta ([place]) 12/21/2016   Hiatal hernia 08/17/2016   Diverticulosis of colon 08/17/2016   Iron deficiency  anemia 08/08/2015   Lumbosacral pain 01/07/2015   Muscle twitching 01/07/2015   Intertrigo 11/05/2014   Perennial allergic rhinitis 09/03/2014   Lung nodules 09/03/2014   Vitamin D deficiency 09/03/2014   Diet-controlled diabetes mellitus ([place]) 09/03/2014   History of kidney stones 09/03/2014   Chronic insomnia 09/03/2014   History of iron deficiency anemia 09/03/2014   Depression with anxiety 09/03/2014   Palpitations 06/22/2014   Bariatric surgery status 12/03/2013   Asthma, mild persistent 11/21/2012   CN (constipation) 11/21/2012   GERD without esophagitis 11/21/2012   History of hyperlipidemia 11/21/2012   Migraine with aura and without status migrainosus 11/21/2012   History of sleep apnea 11/21/2012   IBS (irritable bowel syndrome) 08/15/2012    Past Surgical History:  Procedure Laterality Date   BLADDER SURGERY  2010   BREAST BIOPSY Right 2014   stereotatic biopsy   CHOLECYSTECTOMY  2010   COLONOSCOPY  2014    Done at [place] N/A 01/28/2018   Procedure: COLONOSCOPY WITH PROPOFOL;  Surgeon: Virgel Manifold, MD;  Location: [facility];  Service: Endoscopy;  Laterality: N/A;   DILITATION & CURRETTAGE/HYSTROSCOPY WITH NOVASURE ABLATION N/A 06/16/2015   Procedure: DILATATION & CURETTAGE/HYSTEROSCOPY WITH NOVASURE ABLATION;  Surgeon: Brien Few, MD;  Location: [facility];  Service: Gynecology;  Laterality: N/A;   ESOPHAGOGASTRODUODENOSCOPY (EGD) WITH PROPOFOL N/A 01/28/2018   Procedure: ESOPHAGOGASTRODUODENOSCOPY (EGD) WITH BIOPSIES;  Surgeon: Virgel Manifold, MD;  Location: [facility];  Service: Endoscopy;  Laterality: N/A;   GASTRIC ROUX-EN-Y N/A 11/03/2019   Procedure: LAPAROSCOPIC 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 WITH UPPER ENDOSCOPY;  Surgeon: Johnathan Hausen, MD;  Location: [facility];  Service: General;  Laterality: N/A;   HIATAL HERNIA REPAIR N/A 11/03/2019   Procedure: HERNIA REPAIR  HIATAL;  Surgeon: Johnathan Hausen, MD;  Location: [facility];  Service: General;  Laterality: N/A;   [place] RESECTION  2012   LAPAROSCOPY N/A 06/16/2015   Procedure: LAPAROSCOPY DIAGNOSTIC;  Surgeon: Brien Few, MD;  Location: [facility];  Service: Gynecology;  Laterality: N/A;   LAPAROSCOPY N/A 04/01/2018   Procedure: LAPAROSCOPY DIAGNOSTIC ERAS PATHWAY ENTEROLYSIS, CECOPEXY;  Surgeon: Johnathan Hausen, MD;  Location: [facility];  Service: General;  Laterality: N/A;   LITHOTRIPSY  12/11/2017   laser lithotripsy   LYSIS OF ADHESION N/A 06/16/2015   Procedure: LYSIS OF ADHESION;  Surgeon: Brien Few, MD;  Location: [facility];  Service: Gynecology;  Laterality: N/A;   PLANTAR FASCIA SURGERY Bilateral    ROBOTIC ASSISTED SALPINGO OOPHERECTOMY Right 06/16/2015   Procedure: ROBOTIC ASSISTED SALPINGO OOPHORECTOMY, EXCISION OF RIGHT CUL DE [place] MASS;  Surgeon: Brien Few, MD;  Location: [facility];  Service: Gynecology;  Laterality: Right;   TONSILLECTOMY     UPPER GI ENDOSCOPY      Family History  Problem Relation Age of Onset   Heart attack Mother    Stroke Mother    Multiple myeloma Mother    Hyperlipidemia Father    Heart attack Sister    Heart attack Maternal Uncle    Heart attack Paternal Grandfather    Breast cancer Paternal Aunt    Breast cancer Paternal Grandmother    Lung cancer Maternal Grandfather     Social History   Tobacco Use   Smoking status: Former    Packs/day: 1.00    Years: 10.00    Pack years: 10.00    Types: Cigarettes    Quit date: 02/19/1993    Years since quitting: 28.3   Smokeless tobacco: Never  Substance Use Topics   Alcohol use: Yes    Alcohol/week: 0.0 standard drinks    Comment: occasionally     Current Outpatient Medications:    Accu-Chek FastClix Lancets MISC, USE TO TEST UP TO 4 TIMES A DAY, Disp: , Rfl:    ACCU-CHEK GUIDE test strip, , Disp: , Rfl:    ADDERALL XR 30 MG 24 hr capsule, Take 30 mg by mouth daily. , Disp: , Rfl:     albuterol (VENTOLIN HFA) 108 (90 Base) MCG/ACT inhaler, TAKE 2 PUFFS EVERY 6 HOURS AS NEEDED FOR WHEEZING OR SHORTNESS OF BREATH. (VENTOLIN NOT COVERED), Disp: 8.5 each, Rfl: 2   aspirin EC 81 MG tablet, Take 1 tablet (81 mg total) by mouth daily. Swallow whole., Disp: 90 tablet, Rfl: 3   atorvastatin (LIPITOR) 40 MG tablet, Take 1 tablet (40 mg total) by mouth daily., Disp: 30 tablet, Rfl: 11   B-D 3CC LUER-LOK SYR 25GX1" 25G X 1" 3 ML MISC, , Disp: , Rfl:    Calcium Citrate 1040 MG TABS, Take by mouth., Disp: , Rfl:    calcium citrate-vitamin D (CALCIUM CITRATE CHEWY BITE) 500-500 MG-UNIT chewable tablet, Chew 1 tablet by mouth 3 (three) times daily., Disp: , Rfl:    clobetasol cream (TEMOVATE) 0.05 %, SMARTSIG:Sparingly Topical Twice Daily, Disp: , Rfl:    clobetasol ointment (TEMOVATE) 3.74 %, Apply 1 application topically daily as needed (psoriasis). , Disp: , Rfl: 0   cyanocobalamin (,VITAMIN B-12,) 1000 MCG/ML injection, Inject 1 mL (1,000 mcg total) into the muscle every 14 (fourteen) days., Disp: 6 mL, Rfl: 1   cyclobenzaprine (FLEXERIL) 10 MG tablet, Take 10 mg by mouth at bedtime as needed for muscle spasms., Disp: , Rfl:    fluocinonide (LIDEX) 0.05 % external solution, Apply 1 application topically 2 (two) times daily as needed (psoriasis). , Disp: , Rfl:    gentamicin cream (GARAMYCIN) 0.1 %, Apply 1 application topically 2 (two) times daily., Disp: 30 g, Rfl: 1   IRON-VITAMIN C PO, Take 1 tablet by mouth daily., Disp: , Rfl:    ketoconazole (NIZORAL) 2 % shampoo, Apply 1 application topically 2 (two) times daily as needed (psoriasis). , Disp: , Rfl:    Lactobacillus (ACIDOPHILUS PO), Take 1 capsule by mouth daily. 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 , Disp: , Rfl: 4   montelukast (SINGULAIR) 10 MG tablet, Take 1 tablet (10 mg total) by mouth at bedtime. TAKE 1 TABLET(10 MG) BY  MOUTH AT BEDTIME, Disp: 90 tablet, Rfl: 3   Multiple Vitamin (MULTI-VITAMIN) tablet, Take 1 tablet by mouth daily., Disp: , Rfl:    Multiple Vitamins-Minerals (MULTIVITAMIN ADULT) CHEW, Chew 1 tablet by  mouth daily., Disp: , Rfl:    ondansetron (ZOFRAN) 4 MG tablet, Take by mouth., Disp: , Rfl:    pregabalin (LYRICA) 300 MG capsule, TAKE 1 CAPSULE BY MOUTH TWICE A DAY, Disp: 60 capsule, Rfl: 0   traZODone (DESYREL) 100 MG tablet, Take 200 mg by mouth at bedtime. , Disp: , Rfl:    triamcinolone (NASACORT ALLERGY 24HR) 55 MCG/ACT AERO nasal inhaler, Place 2 sprays into the nose daily., Disp: 1 each, Rfl: 12   triamcinolone cream (KENALOG) 0.1 %, Apply 1 application topically 2 (two) times daily., Disp: 453.6 g, Rfl: 0   TRINTELLIX 20 MG TABS tablet, Take 20 mg by mouth daily. , Disp: , Rfl:    Vitamin D, Ergocalciferol, (DRISDOL) 1.25 MG (50000 UNIT) CAPS capsule, TAKE 1 CAPSULE (50,000 UNITS TOTAL) BY MOUTH EVERY SATURDAY., Disp: 12 capsule, Rfl: 1   zonisamide (ZONEGRAN) 50 MG capsule, Take 150 mg by mouth daily. , Disp: , Rfl: 2  Allergies  Allergen Reactions   Aspartame And Phenylalanine Nausea Only and Other (See Comments)    GI upset    Gabapentin     Bladder incontinence at higher dose   Glucose Nausea Only and Nausea And Vomiting   Linzess [Linaclotide]     Worsening of diarrhea   Other Hives and Other (See Comments)    Ricotta Cheese: flushed and hot   Cymbalta [Duloxetine Hcl] Palpitations    And photosensitivity   Tape Rash    Some clear tapes   Tapentadol Rash    Some clear tapes    I personally reviewed {Reviewed:14835} with the patient/caregiver today.   ROS  ***  Objective  There were no vitals filed for this visit.  There is no height or weight on file to calculate BMI.  Physical Exam ***  No results found for this or any previous visit (from the past 2160 hour(s)).  Diabetic Foot Exam: Diabetic Foot Exam - Simple   No data filed     ***  PHQ2/9:    05/19/2021    3:40 PM 01/06/2021    1:35 PM 11/17/2020    8:19 AM 07/06/2020   11:27 AM 06/20/2020    3:41 PM  Depression screen PHQ 2/9  Decreased Interest 0 1 1 0 1  Down, Depressed, Hopeless 0 0 0 0 0  PHQ - 2 Score 0 1 1 0 1  Altered sleeping 0 0 0 0 0  Tired, decreased energy 0 0 0 0 0  Change in appetite 0 0 0 0 0  Feeling bad or failure about yourself  0 0 0 0 0  Trouble concentrating 0 1 0 1 0  Moving slowly or fidgety/restless 0 0 0 0 0  Suicidal thoughts 0 0 0 0 0  PHQ-9 Score 0 _0 Difficult doing work/chores Not difficult at all  Not difficult at all      phq 9 is {gen pos [id number]} ***  Fall Risk:    05/19/2021    3:40 PM 01/06/2021    1:35 PM 11/17/2020    8:19 AM 07/06/2020   11:20 AM 06/20/2020    3:41 PM  Fall Risk   Falls in the past year? 0 0 0 0 0  Number falls in past yr: 0 0 0 0 0  Injury with Fall? 0 0 0 0 0  Risk for fall due to :  No Fall Risks     Follow up  Falls prevention discussed      ***  Functional Status Survey:   ***   Assessment & Plan  *** There are no diagnoses linked to this encounter. 

## 2021-07-07 NOTE — Progress Notes (Signed)
Name: Claire [name]   MRN: [id number]    DOB: 1970/04/22   Date:07/10/2021       Progress Note  Subjective  Chief Complaint  Follow up   HPI  Major Depression: she is now seeing Dr. Nicolasa Ducking who is also giving her therapy.  She is off Zolot and is currently on Trintelix 20 mg and off Lunesta and taking Trazodone for sleep lately having some RLS at night but able to fall asleep and stay asleep.  She had side effects with Ambien ( interrupted sleep and sleep walking) ,  or Temazepam ( groggy ).  She has been unemployed but trying to find a job, but worried about her memory since COVID-19 in 21   , Dr. Nicolasa Ducking was given Adderall but she was drinking alcohol and it was placed on hold.  .  Asthma Mild Intermittentt: she is doing well now, no cough or wheezing, increase in SOB when very humid outside.    Migraine: she is still seeing Dr. Domingo Cocking - she is on  Zonegran, prn  She is doing better now, she has on average 1 migraine episodes per month, she has tension headaches intermittent . She takes Zonogran    DMII: diagnosed at age 10, took medication for a while, but stopped all medications 10/25/2010 after bariatric surgery.  Maximum weight of 265 lbs and was down to 118.3 lbs - lowest weight back in 08/2017), she was gaining weight since she started to feel better after umbilical hernia repair with  lysis of adhesions and cecopexy back in 04/01/2018, she had a bariatric surgery revision 11/03/2019 by Dr. Hassell Done, at that time weight was 196 lb, and it went down to 184 lbs and is now gradually gaining it back, weight today is 217 lbs, A1C is up , she would like to go back on diabetes medication.    IBS/GERD history of bypass surgery:  She was doing well after  lysis of adhesions and cecopexy back in 04/01/2018. She has intermittent esophageal pain - odynophagia and resolves with regurgitation. She is seeing Dr. Hassell Done    Atherosclerosis of aorta and hyperlipidemia: on statin therapy. Denies side effects     B12 deficiency: she has been getting B12 injections bi-monthly and last level was at goal    Thyroiditis: she is now seeing Dr. Honor Junes    Dizziness/syncope: seen at [facility], and has seen Dr. Margarito Courser 03/2019 , they ruled out POTS but may have had vasovagal syncope. She is doing better, seeing cardiologist now No episodes since first episode   History of COVID: diagnosed 04/21 and again 05/2021   she still has mental fogginess, and feel tired , she also has decrease in exercise tolerance.  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/03/2019   B12 deficiency 04/10/2018   Columnar epithelial-lined lower esophagus    Postprocedural disorder of digestive system    Esophageal dysphagia    Persistent mood (affective) disorder, unspecified ([place]) 01/15/2018   Insomnia related to another mental disorder 01/15/2018  OCD (obsessive compulsive disorder) 10/29/2017   GAD (generalized anxiety disorder) 10/29/2017   Nephrolithiasis 10/22/2017   Atherosclerosis of aorta ([place]) 12/21/2016   Hiatal hernia 08/17/2016   Diverticulosis of colon 08/17/2016   Lumbosacral pain 01/07/2015   Intertrigo 11/05/2014   Perennial allergic rhinitis 09/03/2014   Lung nodules 09/03/2014   Vitamin D deficiency 09/03/2014   History of kidney stones 09/03/2014   History of iron deficiency anemia 09/03/2014   Palpitations 06/22/2014   GERD without esophagitis 11/21/2012   History of hyperlipidemia 11/21/2012   Migraine with aura and without status migrainosus  11/21/2012   History of sleep apnea 11/21/2012   IBS (irritable bowel syndrome) 08/15/2012    Past Surgical History:  Procedure Laterality Date   BLADDER SURGERY  2010   BREAST BIOPSY Right 2014   stereotatic biopsy   CHOLECYSTECTOMY  2010   COLONOSCOPY  2014    Done at [facility] N/A 01/28/2018   Procedure: COLONOSCOPY WITH PROPOFOL;  Surgeon: Virgel Manifold, MD;  Location: [facility];  Service: Endoscopy;  Laterality: N/A;   DILITATION & CURRETTAGE/HYSTROSCOPY WITH NOVASURE ABLATION N/A 06/16/2015   Procedure: DILATATION & CURETTAGE/HYSTEROSCOPY WITH NOVASURE ABLATION;  Surgeon: Brien Few, MD;  Location: [facility];  Service: Gynecology;  Laterality: N/A;   ESOPHAGOGASTRODUODENOSCOPY (EGD) WITH PROPOFOL N/A 01/28/2018   Procedure: ESOPHAGOGASTRODUODENOSCOPY (EGD) WITH BIOPSIES;  Surgeon: Virgel Manifold, MD;  Location: [facility];  Service: Endoscopy;  Laterality: N/A;   GASTRIC ROUX-EN-Y N/A 11/03/2019   Procedure: LAPAROSCOPIC 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 WITH UPPER ENDOSCOPY;  Surgeon: Johnathan Hausen, MD;  Location: [facility];  Service: General;  Laterality: N/A;   HIATAL HERNIA REPAIR N/A 11/03/2019   Procedure: HERNIA REPAIR HIATAL;  Surgeon: Johnathan Hausen, MD;  Location: [facility];  Service: General;  Laterality: N/A;   [place] RESECTION  2012   LAPAROSCOPY N/A 06/16/2015   Procedure: LAPAROSCOPY DIAGNOSTIC;  Surgeon: Brien Few, MD;  Location: [facility];  Service: Gynecology;  Laterality: N/A;   LAPAROSCOPY N/A 04/01/2018   Procedure: LAPAROSCOPY DIAGNOSTIC ERAS PATHWAY ENTEROLYSIS, CECOPEXY;  Surgeon: Johnathan Hausen, MD;  Location: [facility];  Service: General;  Laterality: N/A;   LITHOTRIPSY  12/11/2017   laser lithotripsy   LYSIS OF ADHESION N/A 06/16/2015   Procedure: LYSIS OF ADHESION;  Surgeon: Brien Few, MD;  Location: [facility];  Service: Gynecology;  Laterality:  N/A;   PLANTAR FASCIA SURGERY Bilateral    ROBOTIC ASSISTED SALPINGO OOPHERECTOMY Right 06/16/2015   Procedure: ROBOTIC ASSISTED SALPINGO OOPHORECTOMY, EXCISION OF RIGHT CUL DE [place] MASS;  Surgeon: Brien Few, MD;  Location: [facility];  Service: Gynecology;  Laterality: Right;   TONSILLECTOMY     UPPER GI ENDOSCOPY      Family History  Problem Relation Age of Onset   Heart attack Mother    Stroke Mother    Multiple myeloma Mother    Hyperlipidemia Father    Heart attack Sister    Heart attack Maternal Uncle    Heart attack Paternal Grandfather    Breast cancer Paternal Aunt    Breast cancer Paternal Grandmother    Lung cancer Maternal Grandfather     Social History   Tobacco Use   Smoking status: Former    Packs/day: 1.00    Years: 10.00    Pack years: 10.00    Types: Cigarettes    Quit date: 02/19/1993    Years since quitting: 28.4   Smokeless tobacco:  Never  Substance Use Topics   Alcohol use: Yes    Alcohol/week: 0.0 standard drinks    Comment: occasionally     Current Outpatient Medications:    Accu-Chek FastClix Lancets MISC, USE TO TEST UP TO 4 TIMES A DAY, Disp: , Rfl:    ACCU-CHEK GUIDE test strip, , Disp: , Rfl:    ADDERALL XR 30 MG 24 hr capsule, Take 30 mg by mouth daily. , Disp: , Rfl:    albuterol (VENTOLIN HFA) 108 (90 Base) MCG/ACT inhaler, TAKE 2 PUFFS EVERY 6 HOURS AS NEEDED FOR WHEEZING OR SHORTNESS OF BREATH. (VENTOLIN NOT COVERED), Disp: 8.5 each, Rfl: 2   aspirin EC 81 MG tablet, Take 1 tablet (81 mg total) by mouth daily. Swallow whole., Disp: 90 tablet, Rfl: 3   atorvastatin (LIPITOR) 40 MG tablet, Take 1 tablet (40 mg total) by mouth daily., Disp: 30 tablet, Rfl: 11   B-D 3CC LUER-LOK SYR 25GX1" 25G X 1" 3 ML MISC, , Disp: , Rfl:    Calcium Citrate 1040 MG TABS, Take by mouth., Disp: , Rfl:    calcium citrate-vitamin D (CALCIUM CITRATE CHEWY BITE) 500-500 MG-UNIT chewable tablet, Chew 1 tablet by mouth 3 (three) times daily., Disp: , Rfl:     clobetasol cream (TEMOVATE) 0.05 %, SMARTSIG:Sparingly Topical Twice Daily, Disp: , Rfl:    clobetasol ointment (TEMOVATE) 4.43 %, Apply 1 application topically daily as needed (psoriasis). , Disp: , Rfl: 0   cyanocobalamin (,VITAMIN B-12,) 1000 MCG/ML injection, Inject 1 mL (1,000 mcg total) into the muscle every 14 (fourteen) days., Disp: 6 mL, Rfl: 1   cyclobenzaprine (FLEXERIL) 10 MG tablet, Take 10 mg by mouth at bedtime as needed for muscle spasms., Disp: , Rfl:    fluocinonide (LIDEX) 0.05 % external solution, Apply 1 application topically 2 (two) times daily as needed (psoriasis). , Disp: , Rfl:    IRON-VITAMIN C PO, Take 1 tablet by mouth daily., Disp: , Rfl:    ketoconazole (NIZORAL) 2 % shampoo, Apply 1 application topically 2 (two) times daily as needed (psoriasis). , Disp: , Rfl:    Lactobacillus (ACIDOPHILUS PO), Take 1 capsule by mouth daily. 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 , Disp: , Rfl: 4   Multiple Vitamins-Minerals (MULTIVITAMIN ADULT) CHEW, Chew 1 tablet by mouth daily., Disp: , Rfl:    ondansetron (ZOFRAN) 4 MG tablet, Take by mouth., Disp: , Rfl:    Semaglutide,0.25 or 0.5MG/DOS, 2 MG/3ML SOPN, Inject 0.25-0.5 mg into the skin once a week., Disp: 3 mL, Rfl: 0   traZODone (DESYREL) 100 MG tablet, Take 200 mg by mouth at bedtime. , Disp: , Rfl:    triamcinolone cream (KENALOG) 0.1 %, Apply 1 application topically 2 (two) times daily., Disp: 453.6 g, Rfl: 0   TRINTELLIX 20 MG TABS tablet, Take 20 mg by mouth daily. , Disp: , Rfl:    Vitamin D, Ergocalciferol, (DRISDOL) 1.25 MG (50000 UNIT) CAPS capsule, TAKE 1 CAPSULE (50,000 UNITS TOTAL) BY MOUTH EVERY SATURDAY., Disp: 12 capsule, Rfl: 1   zonisamide (ZONEGRAN) 50 MG capsule, Take 150 mg by mouth daily. , Disp: , Rfl: 2   montelukast (SINGULAIR) 10 MG tablet, Take 1 tablet (10 mg total) by mouth at bedtime. TAKE 1  TABLET(10 MG) BY MOUTH AT BEDTIME, Disp: 90 tablet, Rfl: 3   pregabalin (LYRICA) 300 MG capsule, Take 1 capsule (300 mg  total) by mouth 2 (two) times daily., Disp: 180 capsule, Rfl: 0  Allergies  Allergen Reactions   Aspartame And Phenylalanine Nausea Only and Other (See Comments)    GI upset    Gabapentin     Bladder incontinence at higher dose   Glucose Nausea Only and Nausea And Vomiting   Linzess [Linaclotide]     Worsening of diarrhea   Other Hives and Other (See Comments)    Ricotta Cheese: flushed and hot   Triamcinolone Other (See Comments)   Cymbalta [Duloxetine Hcl] Palpitations    And photosensitivity   Tape Rash    Some clear tapes   Tapentadol Rash    Some clear tapes    I personally reviewed active problem list, medication list, allergies, family history, social history with the patient/caregiver today.   ROS  Constitutional: Negative for fever , positive for weight change.  Respiratory: Negative for cough, positive for intermittent  shortness of breath.   Cardiovascular: Negative for chest pain or palpitations.  Gastrointestinal: Negative for abdominal pain, no bowel changes.  Musculoskeletal: Negative for gait problem or joint swelling.  Skin: Negative for rash.  Neurological: Negative for dizziness , positive for intermittent headache.  No other specific complaints in a complete review of systems (except as listed in HPI above).   Objective  Vitals:   07/10/21 0802  BP: 132/74  Pulse: 88  Resp: 16  SpO2: 98%  Weight: 217 lb (98.4 kg)  Height: 5' 1"  (1.549 m)    Body mass index is 41 kg/m.  Physical Exam  Constitutional: Patient appears well-developed and well-nourished. Obese  No distress.  HEENT: head atraumatic, normocephalic, pupils equal and reactive to light, neck supple Cardiovascular: Normal rate, regular rhythm and normal heart sounds.  No murmur heard. No BLE edema. Pulmonary/Chest: Effort normal and breath sounds normal. No  respiratory distress. Abdominal: Soft.  There is no tenderness. Psychiatric: Patient has a normal mood and affect. behavior is normal. Judgment and thought content normal.   Recent Results (from the past 2160 hour(s))  POCT HgB A1C     Status: Abnormal   Collection Time: 07/10/21  8:12 AM  Result Value Ref Range   Hemoglobin A1C 6.1 (A) 4.0 - 5.6 %   HbA1c POC (<> result, manual entry)     HbA1c, POC (prediabetic range)     HbA1c, POC (controlled diabetic range)      Diabetic Foot Exam: Diabetic Foot Exam - Simple   Simple Foot Form Visual Inspection No deformities, no ulcerations, no other skin breakdown bilaterally: Yes Sensation Testing Intact to touch and monofilament testing bilaterally: Yes Pulse Check Posterior Tibialis and Dorsalis pulse intact bilaterally: Yes Comments      PHQ2/9:    07/10/2021    8:01 AM 07/07/2021   12:29 PM 05/19/2021    3:40 PM 01/06/2021    1:35 PM 11/17/2020    8:19 AM  Depression screen PHQ 2/9  Decreased Interest 1 0 0 1 1  Down, Depressed, Hopeless 1 0 0 0 0  PHQ - 2 Score 2 0 0 1 1  Altered sleeping 0 0 0 0 0  Tired, decreased energy 0 0 0 0 0  Change in appetite 0 0 0 0 0  Feeling bad or failure about yourself  0 0 0 0 0  Trouble concentrating 1 0 0 1 0  Moving slowly or fidgety/restless 0 0 0 0 0  Suicidal thoughts 0 0 0 0 0  PHQ-9 Score 3 0  0 2 1  Difficult doing work/chores   Not difficult at all  Not difficult at all    phq 9 is positive   Fall Risk:    07/10/2021    8:00 AM 07/07/2021   12:28 PM 05/19/2021    3:40 PM 01/06/2021    1:35 PM 11/17/2020    8:19 AM  Fall Risk   Falls in the past year? 0 0 0 0 0  Number falls in past yr: 0  0 0 0  Injury with Fall? 0  0 0 0  Risk for fall due to : No Fall Risks No Fall Risks  No Fall Risks   Follow up Falls prevention discussed Falls prevention discussed  Falls prevention discussed       Functional Status Survey: Is the patient deaf or have difficulty hearing?:  Yes Does the patient have difficulty seeing, even when wearing glasses/contacts?: Yes Does the patient have difficulty concentrating, remembering, or making decisions?: No Does the patient have difficulty walking or climbing stairs?: No Does the patient have difficulty dressing or bathing?: No Does the patient have difficulty doing errands alone such as visiting a doctor's office or shopping?: No    Assessment & Plan  Problem List Items Addressed This Visit     Mild persistent asthma without complication   Relevant Medications   montelukast (SINGULAIR) 10 MG tablet   Migraine with aura and without status migrainosus    She is under the care of neurologist        Relevant Medications   pregabalin (LYRICA) 300 MG capsule   Vitamin D deficiency    Continue supplementation        Atherosclerosis of aorta (HCC)    On statin therapy        Persistent mood (affective) disorder, unspecified ([place]) - Primary    Under the care of Dr. Nicolasa Ducking, patient states psychiatrist has released her from her care, but Dr. Waylan Boga notes does not reflect that .  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 Nicolasa Ducking        Morbid obesity with BMI of 40.0-44.9, adult ([place])    BMI is now above 40 again. She was given [facility] by Endo but not covered by insurance       Relevant Medications   Semaglutide,0.25 or 0.5MG/DOS, 2 MG/3ML SOPN   ADD (attention deficit disorder)   Type 2 diabetes mellitus with obesity (HCC)    A1C is trending up and so is her weight We will try Ozempic since she responded well to medication in the past        Relevant Medications   Semaglutide,0.25 or 0.5MG/DOS, 2 MG/3ML SOPN   Other Relevant Orders   POCT HgB A1C (Completed)   HM Diabetes Foot Exam (Completed)   History of thyroiditis   Fibromyalgia   Relevant Medications   pregabalin (LYRICA) 300 MG capsule    RESOLVED: Chronic insomnia   RESOLVED: Morbid obesity due to excess calories (HCC)   Relevant Medications   Semaglutide,0.25 or 0.5MG/DOS, 2 MG/3ML SOPN   B12 deficiency   Other Visit Diagnoses     Breast cancer screening by mammogram       Relevant Orders   MM 3D SCREEN BREAST BILATERAL

## 2021-07-10 ENCOUNTER — Ambulatory Visit (INDEPENDENT_AMBULATORY_CARE_PROVIDER_SITE_OTHER): Payer: PRIVATE HEALTH INSURANCE | Admitting: Family Medicine

## 2021-07-10 ENCOUNTER — Encounter: Payer: Self-pay | Admitting: Family Medicine

## 2021-07-10 VITALS — BP 132/74 | HR 88 | Resp 16 | Ht 61.0 in | Wt 217.0 lb

## 2021-07-10 DIAGNOSIS — F5104 Psychophysiologic insomnia: Secondary | ICD-10-CM

## 2021-07-10 DIAGNOSIS — E669 Obesity, unspecified: Secondary | ICD-10-CM | POA: Insufficient documentation

## 2021-07-10 DIAGNOSIS — E1169 Type 2 diabetes mellitus with other specified complication: Secondary | ICD-10-CM | POA: Diagnosis not present

## 2021-07-10 DIAGNOSIS — M797 Fibromyalgia: Secondary | ICD-10-CM | POA: Insufficient documentation

## 2021-07-10 DIAGNOSIS — E119 Type 2 diabetes mellitus without complications: Secondary | ICD-10-CM

## 2021-07-10 DIAGNOSIS — Z8639 Personal history of other endocrine, nutritional and metabolic disease: Secondary | ICD-10-CM

## 2021-07-10 DIAGNOSIS — F988 Other specified behavioral and emotional disorders with onset usually occurring in childhood and adolescence: Secondary | ICD-10-CM | POA: Insufficient documentation

## 2021-07-10 DIAGNOSIS — F5105 Insomnia due to other mental disorder: Secondary | ICD-10-CM

## 2021-07-10 DIAGNOSIS — I700 Atherosclerosis of aorta: Secondary | ICD-10-CM | POA: Diagnosis not present

## 2021-07-10 DIAGNOSIS — G43109 Migraine with aura, not intractable, without status migrainosus: Secondary | ICD-10-CM

## 2021-07-10 DIAGNOSIS — Z124 Encounter for screening for malignant neoplasm of cervix: Secondary | ICD-10-CM

## 2021-07-10 DIAGNOSIS — F349 Persistent mood [affective] disorder, unspecified: Secondary | ICD-10-CM

## 2021-07-10 DIAGNOSIS — J4530 Mild persistent asthma, uncomplicated: Secondary | ICD-10-CM

## 2021-07-10 DIAGNOSIS — Z6841 Body Mass Index (BMI) 40.0 and over, adult: Secondary | ICD-10-CM

## 2021-07-10 DIAGNOSIS — F909 Attention-deficit hyperactivity disorder, unspecified type: Secondary | ICD-10-CM

## 2021-07-10 DIAGNOSIS — E559 Vitamin D deficiency, unspecified: Secondary | ICD-10-CM

## 2021-07-10 DIAGNOSIS — E538 Deficiency of other specified B group vitamins: Secondary | ICD-10-CM

## 2021-07-10 DIAGNOSIS — Z1231 Encounter for screening mammogram for malignant neoplasm of breast: Secondary | ICD-10-CM

## 2021-07-10 LAB — POCT GLYCOSYLATED HEMOGLOBIN (HGB A1C): Hemoglobin A1C: 6.1 % — AB (ref 4.0–5.6)

## 2021-07-10 MED ORDER — MONTELUKAST SODIUM 10 MG PO TABS: 10.0000 mg | ORAL_TABLET | Freq: Every day | ORAL | 3 refills | Status: DC

## 2021-07-10 MED ORDER — SEMAGLUTIDE(0.25 OR 0.5MG/DOS) 2 MG/3ML ~~LOC~~ SOPN: 0.2500 mg | PEN_INJECTOR | SUBCUTANEOUS | 0 refills | Status: DC

## 2021-07-10 MED ORDER — PREGABALIN 300 MG PO CAPS: 300.0000 mg | ORAL_CAPSULE | Freq: Two times a day (BID) | ORAL | 0 refills | Status: DC

## 2021-07-10 NOTE — Assessment & Plan Note (Signed)
Under the care of Dr. Nicolasa Ducking, patient states psychiatrist has released her from her care, but Dr. Waylan Boga notes does not reflect that .  She even said not giving her adderal due to alcohol misuse

## 2021-07-10 NOTE — Assessment & Plan Note (Signed)
Takes Trazodone prescribed by Dr. Nicolasa Ducking

## 2021-07-10 NOTE — Assessment & Plan Note (Signed)
Continue supplementation  ?

## 2021-07-10 NOTE — Assessment & Plan Note (Signed)
On statin therapy 

## 2021-07-10 NOTE — Assessment & Plan Note (Signed)
BMI is now above 40 again. She was given [facility] [place] by Endo but not covered by insurance

## 2021-07-10 NOTE — Assessment & Plan Note (Signed)
A1C is trending up and so is her weight We will try Ozempic since she responded well to medication in the past

## 2021-07-10 NOTE — Assessment & Plan Note (Signed)
She is under the care of neurologist

## 2021-08-16 ENCOUNTER — Other Ambulatory Visit: Payer: Self-pay | Admitting: Family Medicine

## 2021-08-16 DIAGNOSIS — E538 Deficiency of other specified B group vitamins: Secondary | ICD-10-CM

## 2021-08-18 ENCOUNTER — Other Ambulatory Visit: Payer: Self-pay | Admitting: Family Medicine

## 2021-08-18 DIAGNOSIS — E1169 Type 2 diabetes mellitus with other specified complication: Secondary | ICD-10-CM

## 2021-10-09 NOTE — Progress Notes (Unsigned)
Name: Claire [name]   MRN: [id number]    DOB: [birth year]   Date:10/10/2021       Progress Note  Subjective  Chief Complaint  Follow Up  HPI  Major Depression: she was seeing Dr. Nicolasa Ducking who is also giving her therapy.  She is off Zolot and is currently on Trintelix 20 mg and off Lunesta and taking Trazodone for sleep.  She had side effects with Ambien ( interrupted sleep and sleep walking) ,  or Temazepam ( groggy ).  She has been unemployed but trying to find a job, but worried about her memory since COVID-19 in 21   , Dr. Nicolasa Ducking was given Adderall but she was drinking alcohol and it was placed on hold, she cannot afford going back to Dr. Nicolasa Ducking. She tastes the wine that she makes but no longer drinking every night , down to three days a week.  .  Asthma Mild Intermittentt: she has noticed chest congestion, post-nasal drainage and nocturnal cough, taking singulair, we will add symbicort to take prn    Migraine: she is still seeing Dr. Domingo Cocking - she is on  Zonegran, prn  She is doing better now, she has on average 1 migraine episodes per month, she has tension headaches intermittent 3-4 , she is stable.    DMII: diagnosed at age 51, took medication for a while, but stopped all medications 10/25/2010 after bariatric surgery.  Maximum weight of 265 lbs and was down to 118.3 lbs - lowest weight back in 08/2017), she was gaining weight since she started to feel better after umbilical hernia repair with  lysis of adhesions and cecopexy back in 04/01/2018, she had a bariatric surgery revision 11/03/2019 by Dr. Hassell Done, at that time weight was 196 lb, and it went down to 184 lbs and is now gradually gaining it back, weight today is 216 lbs, A1C is up ,she is now on Ozempic since May 2023 and we will adjust dose of Ozempic today    IBS/GERD history of bypass surgery:  She was doing well after  lysis of adhesions and cecopexy back in 04/01/2018. She has intermittent esophageal pain - odynophagia and resolves  with regurgitation. She is seeing Dr. Hassell Done    Atherosclerosis of aorta and hyperlipidemia: on statin therapy. Denies side effects    B12 deficiency: she has been getting B12 injections bi-monthly and last level was at goal    Thyroiditis: she is now seeing Dr. Honor Junes , she has hair loss and intermittent diarrhea    Dizziness/syncope: seen at [facility], and has seen Dr. Margarito Courser 03/2019 , they ruled out POTS but may have had vasovagal syncope. She is doing better, seeing cardiologist now Currently seeing Dr. Manuella Ghazi at [facility], first EEG negative - it was ordered due to jerky movements, but had a second study done in August and results are pending, MRI brain early 2023 was normal.   History of COVID: diagnosed 04/21 and again 05/2021   she still has mental fogginess, and feel tired , she also has decrease in exercise tolerance. 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/03/2019   B12 deficiency 04/10/2018   Columnar epithelial-lined lower esophagus    Postprocedural disorder of digestive system    Esophageal dysphagia    Persistent mood (affective) disorder, unspecified ([place]) 01/15/2018   Insomnia related to another mental disorder 01/15/2018   OCD (obsessive compulsive disorder) 10/29/2017   GAD (generalized anxiety disorder) 10/29/2017    Nephrolithiasis 10/22/2017   Atherosclerosis of aorta ([place]) 12/21/2016   Hiatal hernia 08/17/2016   Diverticulosis of colon 08/17/2016   Lumbosacral pain 01/07/2015   Intertrigo 11/05/2014   Perennial allergic rhinitis 09/03/2014   Lung nodules 09/03/2014   Vitamin D deficiency 09/03/2014   History of kidney stones 09/03/2014   History of iron deficiency anemia 09/03/2014   Mild persistent asthma without complication 69/45/0388   GERD without esophagitis 11/21/2012   History of hyperlipidemia 11/21/2012   Migraine with aura and without status migrainosus 11/21/2012   History of sleep apnea 11/21/2012   IBS (irritable bowel syndrome) 08/15/2012    Past Surgical History:  Procedure Laterality Date   BLADDER SURGERY  2010   BREAST BIOPSY Right 2014   stereotatic biopsy   CHOLECYSTECTOMY  2010   COLONOSCOPY  2014    Done at [facility] N/A 01/28/2018   Procedure: COLONOSCOPY WITH PROPOFOL;  Surgeon: Virgel Manifold, MD;  Location: [facility];  Service: Endoscopy;  Laterality: N/A;   DILITATION & CURRETTAGE/HYSTROSCOPY WITH NOVASURE ABLATION N/A 06/16/2015   Procedure: DILATATION & CURETTAGE/HYSTEROSCOPY WITH NOVASURE ABLATION;  Surgeon: Brien Few, MD;  Location: [facility];  Service: Gynecology;  Laterality: N/A;   ESOPHAGOGASTRODUODENOSCOPY (EGD) WITH PROPOFOL N/A 01/28/2018   Procedure: ESOPHAGOGASTRODUODENOSCOPY (EGD) WITH BIOPSIES;  Surgeon: Virgel Manifold, MD;  Location: [facility];  Service: Endoscopy;  Laterality: N/A;   GASTRIC ROUX-EN-Y N/A 11/03/2019   Procedure: LAPAROSCOPIC 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 WITH UPPER ENDOSCOPY;  Surgeon: Johnathan Hausen, MD;  Location: [facility];  Service: General;  Laterality: N/A;   HIATAL HERNIA REPAIR N/A 11/03/2019   Procedure: HERNIA REPAIR HIATAL;  Surgeon: Johnathan Hausen, MD;  Location: [facility];  Service: General;  Laterality: N/A;   [place] RESECTION  2012   LAPAROSCOPY N/A 06/16/2015   Procedure: LAPAROSCOPY DIAGNOSTIC;  Surgeon: Brien Few, MD;  Location: [facility];  Service: Gynecology;  Laterality: N/A;   LAPAROSCOPY N/A 04/01/2018   Procedure: LAPAROSCOPY DIAGNOSTIC ERAS PATHWAY ENTEROLYSIS, CECOPEXY;  Surgeon: Johnathan Hausen, MD;  Location: [facility];  Service: General;  Laterality: N/A;   LITHOTRIPSY  12/11/2017   laser lithotripsy   LYSIS OF ADHESION N/A 06/16/2015   Procedure: LYSIS OF ADHESION;  Surgeon: Brien Few, MD;  Location: [facility];  Service: Gynecology;  Laterality: N/A;   PLANTAR FASCIA SURGERY Bilateral    ROBOTIC ASSISTED SALPINGO OOPHERECTOMY Right 06/16/2015   Procedure: ROBOTIC ASSISTED SALPINGO OOPHORECTOMY, EXCISION OF RIGHT CUL DE [place] MASS;  Surgeon: Brien Few, MD;  Location: [facility];  Service: Gynecology;  Laterality: Right;   TONSILLECTOMY     UPPER GI ENDOSCOPY      Family History  Problem Relation Age of Onset   Heart attack Mother    Stroke Mother    Multiple  myeloma Mother    Hyperlipidemia Father    Heart attack Sister    Heart attack Maternal Uncle    Heart attack Paternal Grandfather    Breast cancer Paternal Aunt    Breast cancer Paternal Grandmother    Lung cancer Maternal Grandfather     Social History   Tobacco Use   Smoking status: Former    Packs/day: 1.00    Years: 10.00    Total pack years: 10.00    Types: Cigarettes    Quit date: 02/19/1993    Years since quitting: 28.6   Smokeless tobacco: Never  Substance Use Topics   Alcohol use: Yes    Alcohol/week: 0.0 standard drinks of alcohol    Comment: occasionally     Current Outpatient Medications:    Accu-Chek FastClix Lancets MISC, USE TO TEST UP TO 4 TIMES A DAY, Disp: , Rfl:    ACCU-CHEK GUIDE test strip, , Disp: , Rfl:    albuterol (VENTOLIN HFA) 108 (90 Base) MCG/ACT inhaler, TAKE 2 PUFFS EVERY 6 HOURS AS NEEDED FOR WHEEZING OR SHORTNESS OF BREATH. (VENTOLIN NOT COVERED), Disp: 8.5 each, Rfl:  2   aspirin EC 81 MG tablet, Take 1 tablet (81 mg total) by mouth daily. Swallow whole., Disp: 90 tablet, Rfl: 3   B-D 3CC LUER-LOK SYR 25GX1" 25G X 1" 3 ML MISC, , Disp: , Rfl:    budesonide-formoterol (SYMBICORT) 160-4.5 MCG/ACT inhaler, Inhale 2 puffs into the lungs 2 (two) times daily., Disp: 1 each, Rfl: 1   calcium citrate-vitamin D (CALCIUM CITRATE CHEWY BITE) 500-500 MG-UNIT chewable tablet, Chew 1 tablet by mouth 3 (three) times daily., Disp: , Rfl:    clobetasol cream (TEMOVATE) 0.05 %, SMARTSIG:Sparingly Topical Twice Daily, Disp: , Rfl:    clobetasol ointment (TEMOVATE) 1.01 %, Apply 1 application topically daily as needed (psoriasis). , Disp: , Rfl: 0   cyanocobalamin (,VITAMIN B-12,) 1000 MCG/ML injection, INJECT 1 ML (1,000 MCG TOTAL) INTO THE MUSCLE EVERY 14 (FOURTEEN) DAYS., Disp: 6 mL, Rfl: 1   cyclobenzaprine (FLEXERIL) 10 MG tablet, Take 10 mg by mouth at bedtime as needed for muscle spasms., Disp: , Rfl:    fluocinonide (LIDEX) 0.05 % external solution, Apply 1 application topically 2 (two) times daily as needed (psoriasis). , Disp: , Rfl:    IRON-VITAMIN C PO, Take 1 tablet by mouth daily., Disp: , Rfl:    ketoconazole (NIZORAL) 2 % shampoo, Apply 1 application topically 2 (two) times daily as needed (psoriasis). , Disp: , Rfl:    Lactobacillus (ACIDOPHILUS PO), Take 1 capsule by mouth daily. Probiotic plus Calcium, Disp: , Rfl:    modafinil (PROVIGIL) 100 MG tablet, Take 1 tablet (100 mg total) by mouth daily., Disp: 90 tablet, Rfl: 0   mometasone (ELOCON) 0.1 % lotion, Apply 1 application topically daily as needed (psoriasis in the ears). , Disp: , Rfl: 4   mometasone (NASONEX) 50 MCG/ACT nasal spray, Place 2 sprays into the nose daily., Disp: 3 each, Rfl: 1   montelukast (SINGULAIR) 10 MG tablet, Take 1 tablet (10 mg total) by mouth at bedtime. TAKE 1 TABLET(10 MG) BY MOUTH AT BEDTIME, Disp: 90 tablet, Rfl: 3   Multiple Vitamins-Minerals (MULTIVITAMIN ADULT) CHEW, Chew 1  tablet by mouth daily., Disp: , Rfl:    ondansetron (ZOFRAN) 4 MG tablet, Take by mouth., Disp: , Rfl:    Semaglutide, 1 MG/DOSE, 4 MG/3ML SOPN, Inject 1 mg as directed once a week., Disp: 9 mL, Rfl: 0   traZODone (  DESYREL) 100 MG tablet, Take 200 mg by mouth at bedtime. , Disp: , Rfl:    triamcinolone cream (KENALOG) 0.1 %, Apply 1 application topically 2 (two) times daily., Disp: 453.6 g, Rfl: 0   TRINTELLIX 20 MG TABS tablet, Take 20 mg by mouth daily. , Disp: , Rfl:    Vitamin D, Ergocalciferol, (DRISDOL) 1.25 MG (50000 UNIT) CAPS capsule, TAKE 1 CAPSULE (50,000 UNITS TOTAL) BY MOUTH EVERY SATURDAY., Disp: 12 capsule, Rfl: 1   zonisamide (ZONEGRAN) 50 MG capsule, Take 150 mg by mouth daily. , Disp: , Rfl: 2   atorvastatin (LIPITOR) 40 MG tablet, Take 1 tablet (40 mg total) by mouth daily., Disp: 30 tablet, Rfl: 11   pregabalin (LYRICA) 300 MG capsule, Take 1 capsule (300 mg total) by mouth 2 (two) times daily., Disp: 180 capsule, Rfl: 1  Allergies  Allergen Reactions   Aspartame And Phenylalanine Nausea Only and Other (See Comments)    GI upset    Gabapentin     Bladder incontinence at higher dose   Glucose Nausea Only and Nausea And Vomiting   Linzess [Linaclotide]     Worsening of diarrhea   Other Hives and Other (See Comments)    Ricotta Cheese: flushed and hot   Triamcinolone Other (See Comments)   Cymbalta [Duloxetine Hcl] Palpitations    And photosensitivity   Tape Rash    Some clear tapes   Tapentadol Rash    Some clear tapes    I personally reviewed active problem list, medication list, allergies, family history, social history, health maintenance with the patient/caregiver today.   ROS  Constitutional: Negative for fever or weight change.  Respiratory: Negative for cough and shortness of breath.   Cardiovascular: Negative for chest pain or palpitations.  Gastrointestinal: Negative for abdominal pain, no bowel changes.  Musculoskeletal: Negative for gait problem or  joint swelling.  Skin: Negative for rash.  Neurological: Negative for dizziness or headache.  No other specific complaints in a complete review of systems (except as listed in HPI above).   Objective  Vitals:   10/10/21 1357  BP: 128/86  Pulse: 84  Resp: 16  SpO2: 96%  Weight: 216 lb (98 kg)  Height: 5' 1"  (1.549 m)    Body mass index is 40.81 kg/m.  Physical Exam  Constitutional: Patient appears well-developed and well-nourished. Obese  No distress.  HEENT: head atraumatic, normocephalic, pupils equal and reactive to light, neck supple Cardiovascular: Normal rate, regular rhythm and normal heart sounds.  No murmur heard. No BLE edema. Pulmonary/Chest: Effort normal and breath sounds normal. No respiratory distress. Abdominal: Soft.  There is no tenderness. Psychiatric: Patient has a normal mood and affect. behavior is normal. Judgment and thought content normal.   Recent Results (from the past 2160 hour(s))  POCT HgB A1C     Status: None   Collection Time: 10/10/21  1:59 PM  Result Value Ref Range   Hemoglobin A1C 5.5 4.0 - 5.6 %   HbA1c POC (<> result, manual entry)     HbA1c, POC (prediabetic range)     HbA1c, POC (controlled diabetic range)        PHQ2/9:    10/10/2021    1:58 PM 07/10/2021    8:01 AM 07/07/2021   12:29 PM 05/19/2021    3:40 PM 01/06/2021    1:35 PM  Depression screen PHQ 2/9  Decreased Interest 0 1 0 0 1  Down, Depressed, Hopeless 0 1 0 0 0  PHQ - 2  Score 0 2 0 0 1  Altered sleeping 0 0 0 0 0  Tired, decreased energy 0 0 0 0 0  Change in appetite 0 0 0 0 0  Feeling bad or failure about yourself  0 0 0 0 0  Trouble concentrating 3 1 0 0 1  Moving slowly or fidgety/restless 0 0 0 0 0  Suicidal thoughts 0 0 0 0 0  PHQ-9 Score 3 3 0 0 2  Difficult doing work/chores    Not difficult at all     phq 9 is negative   Fall Risk:    10/10/2021    1:58 PM 07/10/2021    8:00 AM 07/07/2021   12:28 PM 05/19/2021    3:40 PM 01/06/2021    1:35 PM   Fall Risk   Falls in the past year? 0 0 0 0 0  Number falls in past yr: 0 0  0 0  Injury with Fall? 0 0  0 0  Risk for fall due to : No Fall Risks No Fall Risks No Fall Risks  No Fall Risks  Follow up Falls prevention discussed Falls prevention discussed Falls prevention discussed  Falls prevention discussed      Functional Status Survey: Is the patient deaf or have difficulty hearing?: No Does the patient have difficulty seeing, even when wearing glasses/contacts?: No Does the patient have difficulty concentrating, remembering, or making decisions?: No Does the patient have difficulty walking or climbing stairs?: No Does the patient have difficulty dressing or bathing?: No Does the patient have difficulty doing errands alone such as visiting a doctor's office or shopping?: No    Assessment & Plan  1. Type 2 diabetes mellitus with obesity (HCC)  - POCT HgB A1C - Semaglutide, 1 MG/DOSE, 4 MG/3ML SOPN; Inject 1 mg as directed once a week.  Dispense: 9 mL; Refill: 0  2. Need for immunization against influenza  - Flu Vaccine QUAD 6+ mos PF IM (Fluarix Quad PF)  3. Fibromyalgia  - pregabalin (LYRICA) 300 MG capsule; Take 1 capsule (300 mg total) by mouth 2 (two) times daily.  Dispense: 180 capsule; Refill: 1  4. History of thyroiditis  Keep follow up with Dr. Honor Junes   5. Morbid obesity with BMI of 40.0-44.9, adult [facility])  Discussed with the patient the risk posed by an increased BMI. Discussed importance of portion control, calorie counting and at least 150 minutes of physical activity weekly. Avoid sweet beverages and drink more water. Eat at least 6 servings of fruit and vegetables daily    6. Atherosclerosis of aorta (HCC)  On Atorvastatin  7. Persistent mood (affective) disorder, unspecified (HCC)  Continue Trintelix   8. Mild persistent asthma without complication  - budesonide-formoterol (SYMBICORT) 160-4.5 MCG/ACT inhaler; Inhale 2 puffs into the lungs 2  (two) times daily.  Dispense: 1 each; Refill: 1  9. Chronic insomnia  Continue Trazodone  10. Migraine with aura and without status migrainosus, not intractable   11. Attention deficit hyperactivity disorder (ADHD), unspecified ADHD type  - modafinil (PROVIGIL) 100 MG tablet; Take 1 tablet (100 mg total) by mouth daily.  Dispense: 90 tablet; Refill: 0  12. Perennial allergic rhinitis with seasonal variation  - mometasone (NASONEX) 50 MCG/ACT nasal spray; Place 2 sprays into the nose daily.  Dispense: 3 each; Refill: 1

## 2021-10-10 ENCOUNTER — Encounter: Payer: Self-pay | Admitting: Family Medicine

## 2021-10-10 ENCOUNTER — Ambulatory Visit (INDEPENDENT_AMBULATORY_CARE_PROVIDER_SITE_OTHER): Payer: PRIVATE HEALTH INSURANCE | Admitting: Family Medicine

## 2021-10-10 VITALS — BP 128/86 | HR 84 | Resp 16 | Ht 61.0 in | Wt 216.0 lb

## 2021-10-10 DIAGNOSIS — J4530 Mild persistent asthma, uncomplicated: Secondary | ICD-10-CM

## 2021-10-10 DIAGNOSIS — I700 Atherosclerosis of aorta: Secondary | ICD-10-CM

## 2021-10-10 DIAGNOSIS — F909 Attention-deficit hyperactivity disorder, unspecified type: Secondary | ICD-10-CM

## 2021-10-10 DIAGNOSIS — E1169 Type 2 diabetes mellitus with other specified complication: Secondary | ICD-10-CM

## 2021-10-10 DIAGNOSIS — E669 Obesity, unspecified: Secondary | ICD-10-CM | POA: Diagnosis not present

## 2021-10-10 DIAGNOSIS — Z6841 Body Mass Index (BMI) 40.0 and over, adult: Secondary | ICD-10-CM

## 2021-10-10 DIAGNOSIS — J302 Other seasonal allergic rhinitis: Secondary | ICD-10-CM

## 2021-10-10 DIAGNOSIS — F349 Persistent mood [affective] disorder, unspecified: Secondary | ICD-10-CM

## 2021-10-10 DIAGNOSIS — G43109 Migraine with aura, not intractable, without status migrainosus: Secondary | ICD-10-CM

## 2021-10-10 DIAGNOSIS — Z8639 Personal history of other endocrine, nutritional and metabolic disease: Secondary | ICD-10-CM | POA: Diagnosis not present

## 2021-10-10 DIAGNOSIS — J3089 Other allergic rhinitis: Secondary | ICD-10-CM

## 2021-10-10 DIAGNOSIS — Z23 Encounter for immunization: Secondary | ICD-10-CM

## 2021-10-10 DIAGNOSIS — M797 Fibromyalgia: Secondary | ICD-10-CM

## 2021-10-10 DIAGNOSIS — F5104 Psychophysiologic insomnia: Secondary | ICD-10-CM

## 2021-10-10 LAB — POCT GLYCOSYLATED HEMOGLOBIN (HGB A1C): Hemoglobin A1C: 5.5 % (ref 4.0–5.6)

## 2021-10-10 MED ORDER — PREGABALIN 300 MG PO CAPS: 300.0000 mg | ORAL_CAPSULE | Freq: Two times a day (BID) | ORAL | 1 refills | Status: DC

## 2021-10-10 MED ORDER — BUDESONIDE-FORMOTEROL FUMARATE 160-4.5 MCG/ACT IN AERO: 2.0000 | INHALATION_SPRAY | Freq: Two times a day (BID) | RESPIRATORY_TRACT | 1 refills | Status: DC

## 2021-10-10 MED ORDER — MOMETASONE FUROATE 50 MCG/ACT NA SUSP: 2.0000 | Freq: Every day | NASAL | 1 refills | Status: DC

## 2021-10-10 MED ORDER — MODAFINIL 100 MG PO TABS: 100.0000 mg | ORAL_TABLET | Freq: Every day | ORAL | 0 refills | Status: DC

## 2021-10-10 MED ORDER — TRAZODONE HCL 100 MG PO TABS: 200.0000 mg | ORAL_TABLET | Freq: Every day | ORAL | 1 refills | Status: DC

## 2021-10-10 MED ORDER — SEMAGLUTIDE (1 MG/DOSE) 4 MG/3ML ~~LOC~~ SOPN: 1.0000 mg | PEN_INJECTOR | SUBCUTANEOUS | 0 refills | Status: DC

## 2021-10-10 MED ORDER — TRINTELLIX 20 MG PO TABS: 20.0000 mg | ORAL_TABLET | Freq: Every day | ORAL | 1 refills | Status: DC

## 2021-10-13 ENCOUNTER — Encounter: Payer: Self-pay | Admitting: Family Medicine

## 2021-10-16 ENCOUNTER — Other Ambulatory Visit: Payer: Self-pay | Admitting: Family Medicine

## 2021-10-16 DIAGNOSIS — B3731 Acute candidiasis of vulva and vagina: Secondary | ICD-10-CM

## 2021-10-16 MED ORDER — FLUCONAZOLE 150 MG PO TABS: 150.0000 mg | ORAL_TABLET | ORAL | 0 refills | Status: DC

## 2021-11-28 LAB — HM DIABETES EYE EXAM

## 2021-12-12 ENCOUNTER — Other Ambulatory Visit: Payer: Self-pay | Admitting: Family Medicine

## 2021-12-12 DIAGNOSIS — J4530 Mild persistent asthma, uncomplicated: Secondary | ICD-10-CM

## 2021-12-13 ENCOUNTER — Ambulatory Visit
Admission: RE | Admit: 2021-12-13 | Discharge: 2021-12-13 | Disposition: A | Discharge status: Home / Self Care | Payer: PRIVATE HEALTH INSURANCE | Source: Ambulatory Visit | Attending: Emergency Medicine | Admitting: Emergency Medicine

## 2021-12-13 VITALS — BP 149/82 | HR 69 | Temp 98.1°F | Resp 18 | Ht 61.0 in | Wt 217.0 lb

## 2021-12-13 DIAGNOSIS — J069 Acute upper respiratory infection, unspecified: Secondary | ICD-10-CM | POA: Insufficient documentation

## 2021-12-13 DIAGNOSIS — Z1152 Encounter for screening for COVID-19: Secondary | ICD-10-CM | POA: Insufficient documentation

## 2021-12-13 DIAGNOSIS — J302 Other seasonal allergic rhinitis: Secondary | ICD-10-CM | POA: Insufficient documentation

## 2021-12-13 DIAGNOSIS — R051 Acute cough: Secondary | ICD-10-CM | POA: Insufficient documentation

## 2021-12-13 DIAGNOSIS — R059 Cough, unspecified: Secondary | ICD-10-CM | POA: Insufficient documentation

## 2021-12-13 MED ORDER — BENZONATATE 100 MG PO CAPS: 100.0000 mg | ORAL_CAPSULE | Freq: Three times a day (TID) | ORAL | 0 refills | Status: DC | PRN

## 2021-12-13 NOTE — Discharge Instructions (Addendum)
Take the [facility] as directed.    Your COVID test is pending.    Take Tylenol as needed for fever or discomfort.  Rest and keep yourself hydrated.    Follow-up with your primary care provider if your symptoms are not improving.

## 2021-12-13 NOTE — ED Triage Notes (Signed)
Patient to Urgent Care with complaints of right sided ear pain, reports sinus pain and pressure and difficulty breathing at night due to the congestion. Also reports needing to constantly clear her throat and a headache. Productive cough with clear mucus.   Symptoms started on Saturday. Denies any known fevers. Reports taking tessalon last night.

## 2021-12-13 NOTE — ED Provider Notes (Signed)
Roderic Palau    CSN: [id number] Arrival date & time: 12/13/21  0932      History   Chief Complaint Chief Complaint  Patient presents with   Nasal Congestion    My right ear hurts, my face feels like someone punched me, I am constantly clearing my throat and my head hurts. My husband says I am struggling to breathe at night because of my sinuses. - Entered by patient    HPI Claire [name] is a 51 y.o. female.  Patient presents with 4 day history of ear pain, congestion, sinus pressure, cough, headache.  No fever, rash, shortness of breath, vomiting, diarrhea, or other symptoms.  Treatment at home with her husband's Ladona Ridgel which helps.  Her medical history includes asthma, seasonal allergies, migraine headache, diabetes.   The history is provided by the patient and medical records.    Past Medical History:  Diagnosis Date   Anemia    Anxiety    Arthritis    hands, hips   Asthma    Bilateral leg cramps    Depression    Diabetes mellitus without complication ([place])    history no longer a problem since weight loss surgery   DJD (degenerative joint disease)    Dyspnea on exertion    a. 07/2019 Echo: EF 60-65%, no rwma, nl RV fxn, triv MR.   GERD (gastroesophageal reflux disease)    Headache    Migraines   IBS (irritable bowel syndrome)    Kidney stone    Low serum vitamin B12    Lung nodules    Migraine    down to approx 4x/mo since starting meds   Muscle spasm    Nonobstructive CAD (coronary artery disease)    a. 06/2014 ETT: Ex time 8:59, max HR 155, 10.1 METS, no ST/T changes; b. 08/2019 Cor CTA: Ca2+ 175 (99th %'ile), LAD 35-49p, LCX 0-91m->Med rx.   Psoriasis    vaginal area   Seasonal allergies    Sleep apnea    history no longer a problem since weight loss surgery   Vasovagal syncope    [place], Dr. 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   B12 deficiency 04/10/2018   Columnar epithelial-lined lower esophagus    Postprocedural disorder of digestive system    Esophageal dysphagia    Persistent mood (affective) disorder, unspecified ([facility] 01/15/2018   Insomnia related to another mental disorder 01/15/2018   OCD (obsessive compulsive disorder) 10/29/2017   GAD (generalized anxiety disorder) 10/29/2017   Nephrolithiasis 10/22/2017   Atherosclerosis of aorta ([facility] 12/21/2016   Hiatal hernia 08/17/2016   Diverticulosis of colon 08/17/2016   Lumbosacral pain 01/07/2015   Intertrigo 11/05/2014   Perennial allergic rhinitis 09/03/2014   Lung nodules 09/03/2014   Vitamin D deficiency 09/03/2014   History of kidney stones 09/03/2014   History of iron deficiency anemia 09/03/2014   Mild persistent asthma without complication 152/77/8242  GERD without esophagitis 11/21/2012   History of hyperlipidemia 11/21/2012   Migraine with  aura and without status migrainosus 11/21/2012   History of sleep apnea 11/21/2012   IBS (irritable bowel syndrome) 08/15/2012    Past Surgical History:  Procedure Laterality Date   BLADDER SURGERY  2010   BREAST BIOPSY Right 2014   stereotatic biopsy   CHOLECYSTECTOMY  2010   COLONOSCOPY  2014    Done at [facility] N/A 01/28/2018   Procedure: COLONOSCOPY WITH PROPOFOL;  Surgeon: Virgel Manifold, MD;  Location: [facility];  Service: Endoscopy;  Laterality: N/A;   DILITATION & CURRETTAGE/HYSTROSCOPY WITH NOVASURE ABLATION N/A 06/16/2015   Procedure: DILATATION & CURETTAGE/HYSTEROSCOPY WITH NOVASURE ABLATION;   Surgeon: Brien Few, MD;  Location: [facility];  Service: Gynecology;  Laterality: N/A;   ESOPHAGOGASTRODUODENOSCOPY (EGD) WITH PROPOFOL N/A 01/28/2018   Procedure: ESOPHAGOGASTRODUODENOSCOPY (EGD) WITH BIOPSIES;  Surgeon: Virgel Manifold, MD;  Location: [facility];  Service: Endoscopy;  Laterality: N/A;   GASTRIC ROUX-EN-Y N/A 11/03/2019   Procedure: LAPAROSCOPIC 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 WITH UPPER ENDOSCOPY;  Surgeon: Johnathan Hausen, MD;  Location: [facility];  Service: General;  Laterality: N/A;   HIATAL HERNIA REPAIR N/A 11/03/2019   Procedure: HERNIA REPAIR HIATAL;  Surgeon: Johnathan Hausen, MD;  Location: [facility];  Service: General;  Laterality: N/A;   [place] RESECTION  2012   LAPAROSCOPY N/A 06/16/2015   Procedure: LAPAROSCOPY DIAGNOSTIC;  Surgeon: Brien Few, MD;  Location: [facility];  Service: Gynecology;  Laterality: N/A;   LAPAROSCOPY N/A 04/01/2018   Procedure: LAPAROSCOPY DIAGNOSTIC ERAS PATHWAY ENTEROLYSIS, CECOPEXY;  Surgeon: Johnathan Hausen, MD;  Location: [facility];  Service: General;  Laterality: N/A;   LITHOTRIPSY  12/11/2017   laser lithotripsy   LYSIS OF ADHESION N/A 06/16/2015   Procedure: LYSIS OF ADHESION;  Surgeon: Brien Few, MD;  Location: [facility];  Service: Gynecology;  Laterality: N/A;   PLANTAR FASCIA SURGERY Bilateral    ROBOTIC ASSISTED SALPINGO OOPHERECTOMY Right 06/16/2015   Procedure: ROBOTIC ASSISTED SALPINGO OOPHORECTOMY, EXCISION OF RIGHT CUL DE [place] MASS;  Surgeon: Brien Few, MD;  Location: [facility];  Service: Gynecology;  Laterality: Right;   TONSILLECTOMY     UPPER GI ENDOSCOPY      OB History     Gravida  3   Para  3   Term      Preterm      AB      Living  3      SAB      IAB      Ectopic      Multiple      Live Births           Obstetric Comments  1st Menstrual Cycle:  14 1st Pregnancy:  27          Home Medications    Prior to Admission  medications   Medication Sig Start Date End Date Taking? Authorizing Provider  benzonatate (TESSALON) 100 MG capsule Take 1 capsule (100 mg total) by mouth 3 (three) times daily as needed for cough. 12/13/21  Yes Sharion Balloon, NP  Accu-Chek FastClix Lancets MISC USE TO TEST UP TO 4 TIMES A DAY 12/12/18   [provider]  ACCU-CHEK GUIDE test strip  01/04/19   [provider]  albuterol (VENTOLIN HFA) 108 (90 Base) MCG/ACT inhaler TAKE 2 PUFFS EVERY 6 HOURS AS NEEDED FOR WHEEZING OR SHORTNESS OF BREATH. (VENTOLIN NOT COVERED) 03/17/21   Steele Sizer, MD  aspirin EC 81 MG tablet  Take 1 tablet (81 mg total) by mouth daily. Swallow whole. 03/14/20   Kate Sable, MD  atorvastatin (LIPITOR) 40 MG tablet Take 1 tablet (40 mg total) by mouth daily. 04/13/21 07/12/21  Kate Sable, MD  B-D 3CC LUER-LOK SYR 25GX1" 25G X 1" 3 ML MISC  12/20/19   [provider]  budesonide-formoterol (SYMBICORT) 160-4.5 MCG/ACT inhaler INHALE 2 PUFFS INTO THE LUNGS TWICE A DAY 12/12/21   Steele Sizer, MD  calcium citrate-vitamin D (CALCIUM CITRATE CHEWY BITE) 500-500 MG-UNIT chewable tablet Chew 1 tablet by mouth 3 (three) times daily.    [provider]  clobetasol cream (TEMOVATE) 0.05 % SMARTSIG:Sparingly Topical Twice Daily 10/03/20   [provider]  clobetasol ointment (TEMOVATE) 4.66 % Apply 1 application topically daily as needed (psoriasis).  06/06/15   [provider]  cyanocobalamin (,VITAMIN B-12,) 1000 MCG/ML injection INJECT 1 ML (1,000 MCG TOTAL) INTO THE MUSCLE EVERY 14 (FOURTEEN) DAYS. 08/16/21   Steele Sizer, MD  cyclobenzaprine (FLEXERIL) 10 MG tablet Take 10 mg by mouth at bedtime as needed for muscle spasms.    [provider]  fluconazole (DIFLUCAN) 150 MG tablet Take 1 tablet (150 mg total) by mouth every other day. 10/16/21   Steele Sizer, MD  fluocinonide (LIDEX) 0.05 % external solution Apply 1 application topically 2  (two) times daily as needed (psoriasis).  05/08/18   [provider]  IRON-VITAMIN C PO Take 1 tablet by mouth daily.    [provider]  ketoconazole (NIZORAL) 2 % shampoo Apply 1 application topically 2 (two) times daily as needed (psoriasis).  05/08/18   [provider]  Lactobacillus (ACIDOPHILUS PO) Take 1 capsule by mouth daily. Probiotic plus Calcium    [provider]  modafinil (PROVIGIL) 100 MG tablet Take 1 tablet (100 mg total) by mouth daily. 10/10/21   Steele Sizer, MD  mometasone (ELOCON) 0.1 % lotion Apply 1 application topically daily as needed (psoriasis in the ears).  01/09/17   Beverly Gust, MD  mometasone (NASONEX) 50 MCG/ACT nasal spray Place 2 sprays into the nose daily. 10/10/21   Steele Sizer, MD  montelukast (SINGULAIR) 10 MG tablet Take 1 tablet (10 mg total) by mouth at bedtime. TAKE 1 TABLET(10 MG) BY MOUTH AT BEDTIME 07/10/21   Steele Sizer, MD  Multiple Vitamins-Minerals (MULTIVITAMIN ADULT) CHEW Chew 1 tablet by mouth daily.    [provider]  ondansetron (ZOFRAN) 4 MG tablet Take by mouth. 12/09/19   [provider]  pregabalin (LYRICA) 300 MG capsule Take 1 capsule (300 mg total) by mouth 2 (two) times daily. 10/10/21   Steele Sizer, MD  Semaglutide, 1 MG/DOSE, 4 MG/3ML SOPN Inject 1 mg as directed once a week. 10/10/21   Steele Sizer, MD  traZODone (DESYREL) 100 MG tablet Take 2 tablets (200 mg total) by mouth at bedtime. 10/10/21   Steele Sizer, MD  triamcinolone cream (KENALOG) 0.1 % Apply 1 application topically 2 (two) times daily. 01/09/21   Sowles, Drue Stager, MD  TRINTELLIX 20 MG TABS tablet Take 1 tablet (20 mg total) by mouth daily. 10/10/21   Steele Sizer, MD  Vitamin D, Ergocalciferol, (DRISDOL) 1.25 MG (50000 UNIT) CAPS capsule TAKE 1 CAPSULE (50,000 UNITS TOTAL) BY MOUTH EVERY SATURDAY. 07/01/21   Steele Sizer, MD  zonisamide (ZONEGRAN) 50 MG capsule Take 150 mg by mouth daily.   02/10/16   Orie Rout, MD    Family History Family History  Problem Relation Age of Onset   Heart attack  Mother    Stroke Mother    Multiple myeloma Mother    Hyperlipidemia Father    Heart attack Sister    Heart attack Maternal Uncle    Heart attack Paternal Grandfather    Breast cancer Paternal Aunt    Breast cancer Paternal Grandmother    Lung cancer Maternal Grandfather     Social History Social History   Tobacco Use   Smoking status: Former    Packs/day: 1.00    Years: 10.00    Total pack years: 10.00    Types: Cigarettes    Quit date: 02/19/1993    Years since quitting: 28.8   Smokeless tobacco: Never  Vaping Use   Vaping Use: Never used  Substance Use Topics   Alcohol use: Yes    Alcohol/week: 0.0 standard drinks of alcohol    Comment: occasionally   Drug use: No     Allergies   Aspartame and phenylalanine, Gabapentin, Glucose, Linzess [linaclotide], Other, Triamcinolone, Cymbalta [duloxetine hcl], Tape, and Tapentadol   Review of Systems Review of Systems  Constitutional:  Negative for chills and fever.  HENT:  Positive for congestion, postnasal drip and sinus pressure. Negative for ear pain and sore throat.   Respiratory:  Positive for cough. Negative for shortness of breath.   Cardiovascular:  Negative for chest pain and palpitations.  Gastrointestinal:  Negative for diarrhea and vomiting.  Skin:  Negative for rash.  Neurological:  Positive for headaches.  All other systems reviewed and are negative.    Physical Exam Triage Vital Signs ED Triage Vitals  Enc Vitals Group     BP 12/13/21 1000 (!) 149/82     Pulse Rate 12/13/21 1000 69     Resp 12/13/21 1000 18     Temp 12/13/21 1000 98.1 F (36.7 C)     Temp src --      SpO2 12/13/21 1000 97 %     Weight 12/13/21 1006 217 lb (98.4 kg)     Height 12/13/21 1006 _0  (1.549 m)     Head Circumference --      Peak Flow --      Pain Score 12/13/21 1004 5     Pain Loc --      Pain Edu?  --      Excl. in [place]? --    No data found.  Updated Vital Signs BP (!) 149/82   Pulse 69   Temp 98.1 F (36.7 C)   Resp 18   Ht _1  (1.549 m)   Wt 217 lb (98.4 kg)   LMP 12/12/2021   SpO2 97%   BMI 41.00 kg/m   Visual Acuity Right Eye Distance:   Left Eye Distance:   Bilateral Distance:    Right Eye Near:   Left Eye Near:    Bilateral Near:     Physical Exam Vitals and nursing note reviewed.  Constitutional:      General: She is not in acute distress.    Appearance: She is well-developed. She is not ill-appearing.  HENT:     Right Ear: Tympanic membrane normal.     Left Ear: Tympanic membrane normal.     Nose: Rhinorrhea present.     Mouth/Throat:     Mouth: Mucous membranes are moist.     Pharynx: Oropharynx is clear.  Cardiovascular:     Rate and Rhythm: Normal rate and regular rhythm.     Heart sounds: Normal heart sounds.  Pulmonary:  Effort: Pulmonary effort is normal. No respiratory distress.     Breath sounds: Normal breath sounds.  Musculoskeletal:     Cervical back: Neck supple.  Skin:    General: Skin is warm and dry.  Neurological:     Mental Status: She is alert.  Psychiatric:        Mood and Affect: Mood normal.        Behavior: Behavior normal.      UC Treatments / Results  Labs (all labs ordered are listed, but only abnormal results are displayed) Labs Reviewed  SARS CORONAVIRUS 2 (TAT 6-24 HRS)    EKG   Radiology No results found.  Procedures Procedures (including critical care time)  Medications Ordered in UC Medications - No data to display  Initial Impression / Assessment and Plan / UC Course  I have reviewed the triage vital signs and the nursing notes.  Pertinent labs & imaging results that were available during my care of the patient were reviewed by me and considered in my medical decision making (see chart for details).    Cough, Viral URI, Seasonal allergies.  COVID pending.  Discussed symptomatic  treatment including Tessalon Perles, Tylenol, rest, hydration.  Instructed patient to follow up with her PCP if symptoms are not improving.  She agrees to plan of care.   Final Clinical Impressions(s) / UC Diagnoses   Final diagnoses:  Acute cough  Viral URI  Seasonal allergies     Discharge Instructions      Take the Tessalon Perles as directed.    Your COVID test is pending.    Take Tylenol as needed for fever or discomfort.  Rest and keep yourself hydrated.    Follow-up with your primary care provider if your symptoms are not improving.         ED Prescriptions     Medication Sig Dispense Auth. Provider   benzonatate (TESSALON) 100 MG capsule Take 1 capsule (100 mg total) by mouth 3 (three) times daily as needed for cough. 21 capsule Sharion Balloon, NP      PDMP not reviewed this encounter.   Sharion Balloon, NP 12/13/21 1032

## 2021-12-14 ENCOUNTER — Encounter: Payer: Self-pay | Admitting: Family Medicine

## 2021-12-14 LAB — SARS CORONAVIRUS 2 (TAT 6-24 HRS): SARS Coronavirus 2: NEGATIVE

## 2021-12-15 ENCOUNTER — Encounter: Payer: Self-pay | Admitting: Internal Medicine

## 2021-12-15 ENCOUNTER — Ambulatory Visit (INDEPENDENT_AMBULATORY_CARE_PROVIDER_SITE_OTHER): Payer: PRIVATE HEALTH INSURANCE | Admitting: Internal Medicine

## 2021-12-15 VITALS — BP 124/78 | HR 89 | Temp 98.1°F | Resp 18 | Wt 216.4 lb

## 2021-12-15 DIAGNOSIS — J069 Acute upper respiratory infection, unspecified: Secondary | ICD-10-CM | POA: Diagnosis not present

## 2021-12-15 DIAGNOSIS — R051 Acute cough: Secondary | ICD-10-CM

## 2021-12-15 DIAGNOSIS — R0981 Nasal congestion: Secondary | ICD-10-CM | POA: Diagnosis not present

## 2021-12-15 MED ORDER — METHYLPREDNISOLONE 4 MG PO TBPK: ORAL_TABLET | ORAL | 0 refills | Status: DC

## 2021-12-15 MED ORDER — BENZONATATE 100 MG PO CAPS: 100.0000 mg | ORAL_CAPSULE | Freq: Three times a day (TID) | ORAL | 0 refills | Status: DC | PRN

## 2021-12-15 NOTE — Patient Instructions (Signed)
It was great seeing you today!  Plan discussed at today's visit: -Steroids, tessalon perles sent to pharmacy for symptoms. Also recommend decongestant like Allegra D   Follow up in: as needed   Take care and let us know if you have any questions or concerns prior to your next visit.  Dr. Rosana Berger

## 2021-12-15 NOTE — Progress Notes (Signed)
Acute Office Visit  Subjective:     Patient ID: Claire [name], female    DOB: [birth year], 51 y.o.   MRN: [id number]  Chief Complaint  Patient presents with   URI    Sick since Saturday, went to urgent care on Tuesday neg covid.  Symptoms include: ear pain, facial pressure, headache, congestion and cough    HPI Patient is in today for sinus issues. Patient was seen at Urgent Care on 10/25 and was given tessalon perles for cough. COVID at the time was negative. Symptoms stared about 6 days ago. That night had eaten some bad lobster tails, had abdominal cramping and diarrhea. Then was having sinus pain, congestion and cough.   URI Compliant:  -Fever: no -Cough: yes, dry  -Shortness of breath: yes with exertion and talking  -Wheezing: no -Chest tightness: yes -Chest congestion: yes -Nasal congestion: yes -Runny nose: yes -Post nasal drip: yes -Sore throat: yes -Swollen glands: yes -Sinus pressure: yes -Headache: yes -Ear pain: yes "right -Ear pressure: yes "right -Sick contacts: yes, daugther  -Context: worse -Relief with OTC cold/cough medications: no  -Treatments attempted: Dayquil, Nightquil, tessalon perles    Review of Systems  Constitutional:  Negative for chills and fever.  HENT:  Positive for congestion, ear discharge, ear pain and sore throat.   Respiratory:  Positive for cough and shortness of breath. Negative for sputum production and wheezing.   Cardiovascular:  Negative for chest pain.  Gastrointestinal:  Negative for abdominal pain, diarrhea, nausea and vomiting.       Objective:    BP 124/78   Pulse 89   Temp 98.1 F (36.7 C)   Resp 18   Wt 216 lb 6.4 oz (98.2 kg)   LMP 12/12/2021   SpO2 97%   BMI 40.89 kg/m  BP Readings from Last 3 Encounters:  12/15/21 124/78  12/13/21 (!) 149/82  10/10/21 128/86   Wt Readings from Last 3 Encounters:  12/15/21 216 lb 6.4 oz (98.2 kg)  12/13/21 217 lb (98.4 kg)  10/10/21 216 lb (98 kg)       Physical Exam Constitutional:      Appearance: Normal appearance.  HENT:     Head: Normocephalic and atraumatic.     Right Ear: Ear canal and external ear normal.     Left Ear: Ear canal and external ear normal.     Ears:     Comments: Eustachian tube dysfunction bl     Nose: Congestion present.     Mouth/Throat:     Mouth: Mucous membranes are moist.     Pharynx: Oropharynx is clear.  Eyes:     Conjunctiva/sclera: Conjunctivae normal.  Cardiovascular:     Rate and Rhythm: Normal rate and regular rhythm.  Pulmonary:     Effort: Pulmonary effort is normal.     Breath sounds: Normal breath sounds. No wheezing, rhonchi or rales.  Skin:    General: Skin is warm and dry.  Neurological:     General: No focal deficit present.     Mental Status: She is alert. Mental status is at baseline.  Psychiatric:        Mood and Affect: Mood normal.        Behavior: Behavior normal.     No results found for any visits on 12/15/21.      Assessment & Plan:  1. Viral upper respiratory tract infection/Nasal congestion/Acute cough: Treat symptoms with steroid pack, cough suppressants. Recommend decongestants as well, cannot take Flonase  due to migraine trigger. Follow up if symptoms worsen or fail to improve.  - benzonatate (TESSALON) 100 MG capsule; Take 1 capsule (100 mg total) by mouth 3 (three) times daily as needed for cough.  Dispense: 21 capsule; Refill: 0 - methylPREDNISolone (MEDROL DOSEPAK) 4 MG TBPK tablet; Day 1: Take 8 mg (2 tablets) before breakfast, 4 mg (1 tablet) after lunch, 4 mg (1 tablet) after supper, and 8 mg (2 tablets) at bedtime. Day 2:Take 4 mg (1 tablet) before breakfast, 4 mg (1 tablet) after lunch, 4 mg (1 tablet) after supper, and 8 mg (2 tablets) at bedtime. Day 3: Take 4 mg (1 tablet) before breakfast, 4 mg (1 tablet) after lunch, 4 mg (1 tablet) after supper, and 4 mg (1 tablet) at bedtime. Day 4: Take 4 mg (1 tablet) before breakfast, 4 mg (1 tablet) after lunch,  and 4 mg (1 tablet) at bedtime. Day 5: Take 4 mg (1 tablet) before breakfast and 4 mg (1 tablet) at bedtime. Day 6: Take 4 mg (1 tablet) before breakfast.  Dispense: 1 each; Refill: 0  Return if symptoms worsen or fail to improve.  Teodora Medici, DO

## 2021-12-24 ENCOUNTER — Other Ambulatory Visit: Payer: Self-pay | Admitting: Family Medicine

## 2021-12-24 DIAGNOSIS — E559 Vitamin D deficiency, unspecified: Secondary | ICD-10-CM

## 2021-12-25 ENCOUNTER — Other Ambulatory Visit: Payer: Self-pay

## 2022-01-05 NOTE — Progress Notes (Unsigned)
Name: Claire [name]   MRN: [id number]    DOB: Jan 10, 1971   Date:01/08/2022       Progress Note  Subjective  Chief Complaint  Follow Up  HPI  Major Depression: she was seeing Dr. Nicolasa Ducking who is also getting therapy.  She is off Zolot and is currently on Trintelix 20 mg and off Lunesta and taking Trazodone for sleep.  She had side effects with Ambien ( interrupted sleep and sleep walking) ,  or Temazepam ( groggy ).  ,Dr. Nicolasa Ducking was given Adderall but she was drinking alcohol and it was placed on hold, she cannot afford going back to Dr. Nicolasa Ducking. 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 She was not able to get rx of Provigil filled but she is not sure why. We will send a new rx and do a PA if needed  .  Asthma Mild Intermittentt: doing well at this timeshe takes symbicort prn    Migraine: she is still seeing Dr. Domingo Cocking - she is on  Zonegran, prn  She is doing better now, she has on average 1 migraine episodes per month, she has tension headaches intermittent 3-4 , symptoms are stable.    DMII: diagnosed at age 76, took medication for a while, but stopped all medications 10/25/2010 after bariatric surgery.  Maximum weight of 265 lbs and was down to 118.3 lbs - lowest weight back in 08/2017), she was gaining weight since she started to feel better after umbilical hernia repair with  lysis of adhesions and cecopexy back in 04/01/2018, she had a bariatric surgery revision 11/03/2019 by Dr. Hassell Done, at that time weight was 196 lb, and it went down to 184 lbs and is now gradually gaining it back, , stable since last visit around 215 lbs. She is back on Ozempic since A1C was also trending up    IBS/GERD history of bypass surgery:  She was doing well after  lysis of adhesions and cecopexy back in 04/01/2018. She has intermittent esophageal pain - odynophagia and resolves with regurgitation. She is seeing Dr.  Hassell Done .    Atherosclerosis of aorta and hyperlipidemia: on statin therapy. We will recheck labs today    B12 deficiency: she has been getting B12 injections bi-monthly and we will recheck labs   Thyroiditis: she is under the care of Dr. Honor Junes    Dizziness/syncope: seen at [facility], and has seen Dr. Margarito Courser 03/2019 , they ruled out POTS but may have had vasovagal syncope. She is doing better, seeing cardiologist now Currently seeing Dr. Manuella Ghazi at [facility], first EEG negative - it was ordered due to jerky movements, but had a second study done in August and results are pending, MRI brain early 2023 was normal. No other episodes   History of COVID: diagnosed 04/21 and again 05/2021   she still has mental fogginess, and feel tired , she also has decrease in exercise tolerance. 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/03/2019   B12 deficiency 04/10/2018   Columnar epithelial-lined lower esophagus    Postprocedural disorder of digestive system    Esophageal dysphagia    Persistent mood (affective) disorder, unspecified ([place]) 01/15/2018   Insomnia related to another mental disorder 01/15/2018   OCD (obsessive compulsive disorder) 10/29/2017   GAD (generalized anxiety disorder) 10/29/2017   Nephrolithiasis 10/22/2017    Atherosclerosis of aorta ([place]) 12/21/2016   Hiatal hernia 08/17/2016   Diverticulosis of colon 08/17/2016   Lumbosacral pain 01/07/2015   Intertrigo 11/05/2014   Perennial allergic rhinitis 09/03/2014   Lung nodules 09/03/2014   Vitamin D deficiency 09/03/2014   History of kidney stones 09/03/2014   History of iron deficiency anemia 09/03/2014   Mild persistent asthma without complication 16/11/9602   GERD without esophagitis 11/21/2012   History of hyperlipidemia 11/21/2012   Migraine with aura and without status migrainosus 11/21/2012   History of sleep apnea 11/21/2012   IBS (irritable bowel syndrome) 08/15/2012    Past Surgical History:  Procedure Laterality Date   BLADDER SURGERY  2010   BREAST BIOPSY Right 2014   stereotatic biopsy   CHOLECYSTECTOMY  2010   COLONOSCOPY  2014    Done at [place] N/A 01/28/2018   Procedure: COLONOSCOPY WITH PROPOFOL;  Surgeon: Virgel Manifold, MD;  Location: [place];  Service: Endoscopy;  Laterality: N/A;   DILITATION & CURRETTAGE/HYSTROSCOPY WITH NOVASURE ABLATION N/A 06/16/2015   Procedure: DILATATION & CURETTAGE/HYSTEROSCOPY WITH NOVASURE ABLATION;  Surgeon: Brien Few, MD;  Location: [facility];  Service: Gynecology;  Laterality: N/A;   ESOPHAGOGASTRODUODENOSCOPY (EGD) WITH PROPOFOL N/A 01/28/2018   Procedure: ESOPHAGOGASTRODUODENOSCOPY (EGD) WITH BIOPSIES;  Surgeon: Virgel Manifold, MD;  Location: [facility];  Service: Endoscopy;  Laterality: N/A;   GASTRIC ROUX-EN-Y N/A 11/03/2019   Procedure: LAPAROSCOPIC 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 WITH UPPER ENDOSCOPY;  Surgeon: Johnathan Hausen, MD;  Location: [facility];  Service: General;  Laterality: N/A;   HIATAL HERNIA REPAIR N/A 11/03/2019   Procedure: HERNIA REPAIR HIATAL;  Surgeon: Johnathan Hausen, MD;  Location: [facility];  Service: General;  Laterality: N/A;   [place] RESECTION  2012    LAPAROSCOPY N/A 06/16/2015   Procedure: LAPAROSCOPY DIAGNOSTIC;  Surgeon: Brien Few, MD;  Location: [facility];  Service: Gynecology;  Laterality: N/A;   LAPAROSCOPY N/A 04/01/2018   Procedure: LAPAROSCOPY DIAGNOSTIC ERAS PATHWAY ENTEROLYSIS, CECOPEXY;  Surgeon: Johnathan Hausen, MD;  Location[facility];  Service: General;  Laterality: N/A;   LITHOTRIPSY  12/11/2017   laser lithotripsy   LYSIS OF ADHESION N/A 06/16/2015   Procedure: LYSIS OF ADHESION;  Surgeon: Brien Few, MD;  Location: [facility];  Service: Gynecology;  Laterality: N/A;   PLANTAR FASCIA SURGERY Bilateral    ROBOTIC ASSISTED SALPINGO OOPHERECTOMY Right 06/16/2015   Procedure: ROBOTIC ASSISTED SALPINGO OOPHORECTOMY, EXCISION OF RIGHT CUL DE [place] MASS;  Surgeon: Brien Few, MD;  Location: [facility];  Service: Gynecology;  Laterality: Right;   TONSILLECTOMY     UPPER GI ENDOSCOPY      Family History  Problem Relation Age of Onset   Heart attack Mother    Stroke Mother  Multiple myeloma Mother    Hyperlipidemia Father    Heart attack Sister    Heart attack Maternal Uncle    Heart attack Paternal Grandfather    Breast cancer Paternal Aunt    Breast cancer Paternal Grandmother    Lung cancer Maternal Grandfather     Social History   Tobacco Use   Smoking status: Former    Packs/day: 1.00    Years: 10.00    Total pack years: 10.00    Types: Cigarettes    Quit date: 02/19/1993    Years since quitting: 28.9   Smokeless tobacco: Never  Substance Use Topics   Alcohol use: Yes    Alcohol/week: 0.0 standard drinks of alcohol    Comment: occasionally     Current Outpatient Medications:    Accu-Chek FastClix Lancets MISC, USE TO TEST UP TO 4 TIMES A DAY, Disp: , Rfl:    ACCU-CHEK GUIDE test strip, , Disp: , Rfl:    albuterol (VENTOLIN HFA) 108 (90 Base) MCG/ACT inhaler, TAKE 2 PUFFS EVERY 6 HOURS AS NEEDED FOR WHEEZING OR SHORTNESS OF BREATH. (VENTOLIN NOT COVERED), Disp: 8.5 each, Rfl: 2   aspirin EC 81 MG tablet,  Take 1 tablet (81 mg total) by mouth daily. Swallow whole., Disp: 90 tablet, Rfl: 3   B-D 3CC LUER-LOK SYR 25GX1" 25G X 1" 3 ML MISC, , Disp: , Rfl:    benzonatate (TESSALON) 100 MG capsule, Take 1 capsule (100 mg total) by mouth 3 (three) times daily as needed for cough., Disp: 21 capsule, Rfl: 0   budesonide-formoterol (SYMBICORT) 160-4.5 MCG/ACT inhaler, INHALE 2 PUFFS INTO THE LUNGS TWICE A DAY, Disp: 10.2 each, Rfl: 1   calcium citrate-vitamin D (CALCIUM CITRATE CHEWY BITE) 500-500 MG-UNIT chewable tablet, Chew 1 tablet by mouth 3 (three) times daily., Disp: , Rfl:    clobetasol cream (TEMOVATE) 0.05 %, SMARTSIG:Sparingly Topical Twice Daily, Disp: , Rfl:    clobetasol ointment (TEMOVATE) 0.10 %, Apply 1 application topically daily as needed (psoriasis). , Disp: , Rfl: 0   cyanocobalamin (,VITAMIN B-12,) 1000 MCG/ML injection, INJECT 1 ML (1,000 MCG TOTAL) INTO THE MUSCLE EVERY 14 (FOURTEEN) DAYS., Disp: 6 mL, Rfl: 1   cyclobenzaprine (FLEXERIL) 10 MG tablet, Take 10 mg by mouth at bedtime as needed for muscle spasms., Disp: , Rfl:    fluconazole (DIFLUCAN) 150 MG tablet, Take 1 tablet (150 mg total) by mouth every other day., Disp: 3 tablet, Rfl: 0   fluocinonide (LIDEX) 0.05 % external solution, Apply 1 application topically 2 (two) times daily as needed (psoriasis). , Disp: , Rfl:    IRON-VITAMIN C PO, Take 1 tablet by mouth daily., Disp: , Rfl:    ketoconazole (NIZORAL) 2 % shampoo, Apply 1 application topically 2 (two) times daily as needed (psoriasis). , Disp: , Rfl:    Lactobacillus (ACIDOPHILUS PO), Take 1 capsule by mouth daily. Probiotic plus Calcium, Disp: , Rfl:    methylPREDNISolone (MEDROL DOSEPAK) 4 MG TBPK tablet, Day 1: Take 8 mg (2 tablets) before breakfast, 4 mg (1 tablet) after lunch, 4 mg (1 tablet) after supper, and 8 mg (2 tablets) at bedtime. Day 2:Take 4 mg (1 tablet) before breakfast, 4 mg (1 tablet) after lunch, 4 mg (1 tablet) after supper, and 8 mg (2 tablets) at  bedtime. Day 3: Take 4 mg (1 tablet) before breakfast, 4 mg (1 tablet) after lunch, 4 mg (1 tablet) after supper, and 4 mg (1 tablet) at bedtime. Day 4: Take 4 mg (1  tablet) before breakfast, 4 mg (1 tablet) after lunch, and 4 mg (1 tablet) at bedtime. Day 5: Take 4 mg (1 tablet) before breakfast and 4 mg (1 tablet) at bedtime. Day 6: Take 4 mg (1 tablet) before breakfast., Disp: 1 each, Rfl: 0   mometasone (ELOCON) 0.1 % lotion, Apply 1 application topically daily as needed (psoriasis in the ears). , Disp: , Rfl: 4   mometasone (NASONEX) 50 MCG/ACT nasal spray, Place 2 sprays into the nose daily., Disp: 3 each, Rfl: 1   montelukast (SINGULAIR) 10 MG tablet, Take 1 tablet (10 mg total) by mouth at bedtime. TAKE 1 TABLET(10 MG) BY MOUTH AT BEDTIME, Disp: 90 tablet, Rfl: 3   Multiple Vitamins-Minerals (MULTIVITAMIN ADULT) CHEW, Chew 1 tablet by mouth daily., Disp: , Rfl:    ondansetron (ZOFRAN) 4 MG tablet, Take by mouth., Disp: , Rfl:    pregabalin (LYRICA) 300 MG capsule, Take 1 capsule (300 mg total) by mouth 2 (two) times daily., Disp: 180 capsule, Rfl: 1   Semaglutide, 1 MG/DOSE, 4 MG/3ML SOPN, Inject 1 mg as directed once a week., Disp: 9 mL, Rfl: 0   traZODone (DESYREL) 100 MG tablet, Take 2 tablets (200 mg total) by mouth at bedtime., Disp: 90 tablet, Rfl: 1   triamcinolone cream (KENALOG) 0.1 %, Apply 1 application topically 2 (two) times daily., Disp: 453.6 g, Rfl: 0   TRINTELLIX 20 MG TABS tablet, Take 1 tablet (20 mg total) by mouth daily., Disp: 90 tablet, Rfl: 1   Vitamin D, Ergocalciferol, (DRISDOL) 1.25 MG (50000 UNIT) CAPS capsule, TAKE 1 CAPSULE (50,000 UNITS TOTAL) BY MOUTH EVERY SATURDAY., Disp: 12 capsule, Rfl: 1   zonisamide (ZONEGRAN) 50 MG capsule, Take 150 mg by mouth daily. , Disp: , Rfl: 2   atorvastatin (LIPITOR) 40 MG tablet, Take 1 tablet (40 mg total) by mouth daily., Disp: 30 tablet, Rfl: 11   modafinil (PROVIGIL) 100 MG tablet, Take 1 tablet (100 mg total) by mouth daily.  (Patient not taking: Reported on 01/08/2022), Disp: 90 tablet, Rfl: 0  Allergies  Allergen Reactions   Aspartame And Phenylalanine Nausea Only and Other (See Comments)    GI upset    Gabapentin     Bladder incontinence at higher dose   Glucose Nausea Only and Nausea And Vomiting   Linzess [Linaclotide]     Worsening of diarrhea   Other Hives and Other (See Comments)    Ricotta Cheese: flushed and hot   Triamcinolone Other (See Comments)   Cymbalta [Duloxetine Hcl] Palpitations    And photosensitivity   Tape Rash    Some clear tapes   Tapentadol Rash    Some clear tapes    I personally reviewed active problem list, medication list, allergies, family history, social history, health maintenance with the patient/caregiver today.   ROS  Ten systems reviewed and is negative except as mentioned in HPI   Objective  Vitals:   01/08/22 1137  BP: 130/72  Pulse: 89  Resp: 16  SpO2: 98%  Weight: 215 lb (97.5 kg)  Height: 5' 2" (1.575 m)    Body mass index is 39.32 kg/m.  Physical Exam  Constitutional: Patient appears well-developed and well-nourished. Obese  No distress.  HEENT: head atraumatic, normocephalic, pupils equal and reactive to light,, neck supple Cardiovascular: Normal rate, regular rhythm and normal heart sounds.  No murmur heard. No BLE edema. Pulmonary/Chest: Effort normal and breath sounds normal. No respiratory distress. Abdominal: Soft.  There is no tenderness. Psychiatric: Patient  has a normal mood and affect. behavior is normal. Judgment and thought content normal.   Recent Results (from the past 2160 hour(s))  POCT HgB A1C     Status: None   Collection Time: 10/10/21  1:59 PM  Result Value Ref Range   Hemoglobin A1C 5.5 4.0 - 5.6 %   HbA1c POC (<> result, manual entry)     HbA1c, POC (prediabetic range)     HbA1c, POC (controlled diabetic range)    HM DIABETES EYE EXAM     Status: None   Collection Time: 11/28/21 12:00 AM  Result Value Ref Range    HM Diabetic Eye Exam No Retinopathy No Retinopathy  SARS CORONAVIRUS 2 (TAT 6-24 HRS) Anterior Nasal Swab     Status: None   Collection Time: 12/13/21 10:12 AM   Specimen: Anterior Nasal Swab  Result Value Ref Range   SARS Coronavirus 2 NEGATIVE NEGATIVE    Comment: (NOTE) SARS-CoV-2 target nucleic acids are NOT DETECTED.  The SARS-CoV-2 RNA is generally detectable in upper and lower respiratory specimens during the acute phase of infection. Negative results do not preclude SARS-CoV-2 infection, do not rule out co-infections with other pathogens, and should not be used as the sole basis for treatment or other patient management decisions. Negative results must be combined with clinical observations, patient history, and epidemiological information. The expected result is Negative.  Fact Sheet for Patients: SugarRoll.be  Fact Sheet for Healthcare Providers: https://www.woods-mathews.com/  This test is not yet approved or cleared by the [place] FDA and  has been authorized for detection and/or diagnosis of SARS-CoV-2 by FDA under an Emergency Use Authorization (EUA). This EUA will remain  in effect (meaning this test can be used) for the duration of the COVID-19 declaration under Se ction 564(b)(1) of the Act, 21 U.S.C. section 360bbb-3(b)(1), unless the authorization is terminated or revoked sooner.  Performed at [facility], [place] [address], [place], [place] [address]   POCT HgB A1C     Status: Abnormal   Collection Time: 01/08/22 11:39 AM  Result Value Ref Range   Hemoglobin A1C 5.9 (A) 4.0 - 5.6 %   HbA1c POC (<> result, manual entry)     HbA1c, POC (prediabetic range)     HbA1c, POC (controlled diabetic range)      PHQ2/9:    01/08/2022   11:38 AM 12/15/2021   11:26 AM 10/10/2021    1:58 PM 07/10/2021    8:01 AM 07/07/2021   12:29 PM  Depression screen PHQ 2/9  Decreased Interest 0 0 0 1 0  Down, Depressed,  Hopeless 0 0 0 1 0  PHQ - 2 Score 0 0 0 2 0  Altered sleeping 0 0 0 0 0  Tired, decreased energy 0 0 0 0 0  Change in appetite 0 0 0 0 0  Feeling bad or failure about yourself  0 0 0 0 0  Trouble concentrating 0 0 3 1 0  Moving slowly or fidgety/restless 0 0 0 0 0  Suicidal thoughts 0 0 0 0 0  PHQ-9 Score 0 0 3 3 0  Difficult doing work/chores  Not difficult at all       phq 9 is negative   Fall Risk:    01/08/2022   11:38 AM 12/15/2021   11:26 AM 10/10/2021    1:58 PM 07/10/2021    8:00 AM 07/07/2021   12:28 PM  Fall Risk   Falls in the past year? 0  0 0 0 0  Number falls in past yr: 0 0 0 0   Injury with Fall? 0 0 0 0   Risk for fall due to : No Fall Risks  No Fall Risks No Fall Risks No Fall Risks  Follow up Falls prevention discussed  Falls prevention discussed Falls prevention discussed Falls prevention discussed      Functional Status Survey: Is the patient deaf or have difficulty hearing?: No Does the patient have difficulty seeing, even when wearing glasses/contacts?: No Does the patient have difficulty concentrating, remembering, or making decisions?: No Does the patient have difficulty walking or climbing stairs?: No Does the patient have difficulty dressing or bathing?: No Does the patient have difficulty doing errands alone such as visiting a doctor's office or shopping?: No    Assessment & Plan  1. Type 2 diabetes mellitus with obesity (HCC)  - POCT HgB A1C - Urine Microalbumin w/creat. ratio - COMPLETE METABOLIC PANEL WITH GFR - Semaglutide, 1 MG/DOSE, 4 MG/3ML SOPN; Inject 1 mg as directed once a week.  Dispense: 9 mL; Refill: 0  2. Vitamin D deficiency  Continue supplementation  3. B12 deficiency  - B12 and Folate Panel - CBC with Differential/Platelet  4. Atherosclerosis of aorta (HCC)  - Lipid panel  5. Fibromyalgia  - pregabalin (LYRICA) 300 MG capsule; Take 1 capsule (300 mg total) by mouth 2 (two) times daily.  Dispense: 180 capsule;  Refill: 1  6. Chronic insomnia  - traZODone (DESYREL) 100 MG tablet; Take 2 tablets (200 mg total) by mouth at bedtime.  Dispense: 180 tablet; Refill: 1  7. Attention deficit hyperactivity disorder (ADHD), unspecified ADHD type  - modafinil (PROVIGIL) 100 MG tablet; Take 1 tablet (100 mg total) by mouth daily.  Dispense: 90 tablet; Refill: 0  8. Mild persistent asthma without complication   9. History of bariatric surgery   10. Post-COVID chronic fatigue  - modafinil (PROVIGIL) 100 MG tablet; Take 1 tablet (100 mg total) by mouth daily.  Dispense: 90 tablet; Refill: 0  11. Screening-pulmonary TB  - QuantiFERON-TB Gold Plus

## 2022-01-08 ENCOUNTER — Encounter: Payer: Self-pay | Admitting: Family Medicine

## 2022-01-08 ENCOUNTER — Ambulatory Visit (INDEPENDENT_AMBULATORY_CARE_PROVIDER_SITE_OTHER): Payer: PRIVATE HEALTH INSURANCE | Admitting: Family Medicine

## 2022-01-08 VITALS — BP 130/72 | HR 89 | Resp 16 | Ht 62.0 in | Wt 215.0 lb

## 2022-01-08 DIAGNOSIS — E1169 Type 2 diabetes mellitus with other specified complication: Secondary | ICD-10-CM | POA: Diagnosis not present

## 2022-01-08 DIAGNOSIS — E669 Obesity, unspecified: Secondary | ICD-10-CM | POA: Diagnosis not present

## 2022-01-08 DIAGNOSIS — I700 Atherosclerosis of aorta: Secondary | ICD-10-CM

## 2022-01-08 DIAGNOSIS — E559 Vitamin D deficiency, unspecified: Secondary | ICD-10-CM

## 2022-01-08 DIAGNOSIS — G9332 Myalgic encephalomyelitis/chronic fatigue syndrome: Secondary | ICD-10-CM

## 2022-01-08 DIAGNOSIS — F909 Attention-deficit hyperactivity disorder, unspecified type: Secondary | ICD-10-CM

## 2022-01-08 DIAGNOSIS — U099 Post covid-19 condition, unspecified: Secondary | ICD-10-CM

## 2022-01-08 DIAGNOSIS — E538 Deficiency of other specified B group vitamins: Secondary | ICD-10-CM | POA: Diagnosis not present

## 2022-01-08 DIAGNOSIS — F5104 Psychophysiologic insomnia: Secondary | ICD-10-CM

## 2022-01-08 DIAGNOSIS — J4530 Mild persistent asthma, uncomplicated: Secondary | ICD-10-CM

## 2022-01-08 DIAGNOSIS — Z9884 Bariatric surgery status: Secondary | ICD-10-CM

## 2022-01-08 DIAGNOSIS — Z111 Encounter for screening for respiratory tuberculosis: Secondary | ICD-10-CM

## 2022-01-08 DIAGNOSIS — M797 Fibromyalgia: Secondary | ICD-10-CM

## 2022-01-08 LAB — POCT GLYCOSYLATED HEMOGLOBIN (HGB A1C): Hemoglobin A1C: 5.9 % — AB (ref 4.0–5.6)

## 2022-01-08 MED ORDER — MODAFINIL 100 MG PO TABS: 100.0000 mg | ORAL_TABLET | Freq: Every day | ORAL | 0 refills | Status: DC

## 2022-01-08 MED ORDER — SEMAGLUTIDE (1 MG/DOSE) 4 MG/3ML ~~LOC~~ SOPN: 1.0000 mg | PEN_INJECTOR | SUBCUTANEOUS | 0 refills | Status: DC

## 2022-01-08 MED ORDER — PREGABALIN 300 MG PO CAPS: 300.0000 mg | ORAL_CAPSULE | Freq: Two times a day (BID) | ORAL | 1 refills | Status: DC

## 2022-01-08 MED ORDER — TRAZODONE HCL 100 MG PO TABS: 200.0000 mg | ORAL_TABLET | Freq: Every day | ORAL | 1 refills | Status: DC

## 2022-01-09 ENCOUNTER — Other Ambulatory Visit: Payer: Self-pay | Admitting: Family Medicine

## 2022-01-09 DIAGNOSIS — R748 Abnormal levels of other serum enzymes: Secondary | ICD-10-CM

## 2022-01-13 ENCOUNTER — Encounter: Payer: Self-pay | Admitting: Family Medicine

## 2022-01-14 LAB — COMPLETE METABOLIC PANEL WITH GFR
AG Ratio: 1.3 (calc) (ref 1.0–2.5)
ALT: 135 U/L — ABNORMAL HIGH (ref 6–29)
AST: 159 U/L — ABNORMAL HIGH (ref 10–35)
Albumin: 4.1 g/dL (ref 3.6–5.1)
Alkaline phosphatase (APISO): 98 U/L (ref 31–125)
BUN/Creatinine Ratio: 9 (calc) (ref 6–22)
BUN: 6 mg/dL — ABNORMAL LOW (ref 7–25)
CO2: 28 mmol/L (ref 20–32)
Calcium: 9.5 mg/dL (ref 8.6–10.2)
Chloride: 104 mmol/L (ref 98–110)
Creat: 0.65 mg/dL (ref 0.50–0.99)
Globulin: 3.1 g/dL (calc) (ref 1.9–3.7)
Glucose, Bld: 98 mg/dL (ref 65–99)
Potassium: 4.6 mmol/L (ref 3.5–5.3)
Sodium: 141 mmol/L (ref 135–146)
Total Bilirubin: 0.6 mg/dL (ref 0.2–1.2)
Total Protein: 7.2 g/dL (ref 6.1–8.1)
eGFR: 108 mL/min/{1.73_m2} (ref >=60)

## 2022-01-14 LAB — CBC WITH DIFFERENTIAL/PLATELET
Absolute Monocytes: 483 cells/uL (ref 200–950)
Basophils Absolute: 42 cells/uL (ref 0–200)
Basophils Relative: 0.6 %
Eosinophils Absolute: 182 cells/uL (ref 15–500)
Eosinophils Relative: 2.6 %
HCT: 44.8 % (ref 35.0–45.0)
Hemoglobin: 15.1 g/dL (ref 11.7–15.5)
Lymphs Abs: 1260 cells/uL (ref 850–3900)
MCH: 30.4 pg (ref 27.0–33.0)
MCHC: 33.7 g/dL (ref 32.0–36.0)
MCV: 90.3 fL (ref 80.0–100.0)
MPV: 11 fL (ref 7.5–12.5)
Monocytes Relative: 6.9 %
Neutro Abs: 5033 cells/uL (ref 1500–7800)
Neutrophils Relative %: 71.9 %
Platelets: 256 10*3/uL (ref 140–400)
RBC: 4.96 10*6/uL (ref 3.80–5.10)
RDW: 12.9 % (ref 11.0–15.0)
Total Lymphocyte: 18 %
WBC: 7 10*3/uL (ref 3.8–10.8)

## 2022-01-14 LAB — QUANTIFERON-TB GOLD PLUS
Mitogen-NIL: >10 IU/mL
NIL: 0.08 IU/mL
QuantiFERON-TB Gold Plus: NEGATIVE
TB1-NIL: <0 IU/mL
TB2-NIL: <0 IU/mL

## 2022-01-14 LAB — LIPID PANEL
Cholesterol: 190 mg/dL (ref <200)
HDL: 64 mg/dL (ref >=50)
LDL Cholesterol (Calc): 102 mg/dL (calc) — ABNORMAL HIGH
Non-HDL Cholesterol (Calc): 126 mg/dL (calc) (ref <130)
Total CHOL/HDL Ratio: 3 (calc) (ref <5.0)
Triglycerides: 143 mg/dL (ref <150)

## 2022-01-14 LAB — MICROALBUMIN / CREATININE URINE RATIO
Creatinine, Urine: 135 mg/dL (ref 20–275)
Microalb Creat Ratio: 7 mcg/mg creat (ref <30)
Microalb, Ur: 1 mg/dL

## 2022-01-14 LAB — B12 AND FOLATE PANEL
Folate: 18.2 ng/mL
Vitamin B-12: 318 pg/mL (ref 200–1100)

## 2022-01-19 ENCOUNTER — Telehealth: Payer: Self-pay | Admitting: Family Medicine

## 2022-01-19 DIAGNOSIS — E1169 Type 2 diabetes mellitus with other specified complication: Secondary | ICD-10-CM

## 2022-01-22 NOTE — Telephone Encounter (Signed)
Requested medication (s) are due for refill today:   Yes  Requested medication (s) are on the active medication list:   Yes  Future visit scheduled:   Yes   Last ordered: 01/08/2022 9 ml, 0 refills  Returned because this is on backorder/unavailable      Requested Prescriptions  Pending Prescriptions Disp Refills   OZEMPIC, 1 MG/DOSE, 4 MG/3ML SOPN [Pharmacy Med Name: [place] 4 MG/3 ML (1 MG/DOSE)]  0    Sig: INJECT 1 MG ONCE A WEEK AS DIRECTED     Endocrinology:  Diabetes - GLP-1 Receptor Agonists - semaglutide Failed - 01/19/2022  7:00 PM      Failed - HBA1C in normal range and within 180 days    Hemoglobin A1C  Date Value Ref Range Status  01/08/2022 5.9 (A) 4.0 - 5.6 % Final   Hgb A1c MFr Bld  Date Value Ref Range Status  10/28/2019 5.5 4.8 - 5.6 % Final    Comment:    (NOTE) Pre diabetes:          5.7%-6.4%  Diabetes:              >6.4%  Glycemic control for   <7.0% adults with diabetes          Passed - Cr in normal range and within 360 days    Creat  Date Value Ref Range Status  01/08/2022 0.65 0.50 - 0.99 mg/dL Final   Creatinine, POC  Date Value Ref Range Status  11/19/2016 NEG mg/dL Final   Creatinine, Urine  Date Value Ref Range Status  01/08/2022 135 20 - 275 mg/dL Final         Passed - Valid encounter within last 6 months    Recent Outpatient Visits           2 weeks ago Type 2 diabetes mellitus with obesity [facility])   [facility] Steele Sizer, MD   1 month ago Viral upper respiratory tract infection   [facility] Teodora Medici, DO   3 months ago Type 2 diabetes mellitus with obesity [facility])   [facility] Steele Sizer, MD   6 months ago Persistent mood (affective) disorder, unspecified [facility])   [facility] Steele Sizer, MD   6 months ago Erroneous encounter - disregard   [facility] Steele Sizer, MD       Future Appointments              In 5 months Ancil Boozer, Drue Stager, MD [facility]

## 2022-02-04 ENCOUNTER — Other Ambulatory Visit: Payer: Self-pay | Admitting: Family Medicine

## 2022-02-04 DIAGNOSIS — E538 Deficiency of other specified B group vitamins: Secondary | ICD-10-CM

## 2022-02-06 ENCOUNTER — Encounter: Payer: Self-pay | Admitting: Family Medicine

## 2022-02-07 ENCOUNTER — Telehealth (INDEPENDENT_AMBULATORY_CARE_PROVIDER_SITE_OTHER): Payer: PRIVATE HEALTH INSURANCE | Admitting: Physician Assistant

## 2022-02-07 DIAGNOSIS — K521 Toxic gastroenteritis and colitis: Secondary | ICD-10-CM

## 2022-02-07 NOTE — Progress Notes (Signed)
    Virtual Visit via Video Note  I connected with Claire [name] on 02/07/22 at  8:40 AM EST by a video enabled telemedicine application and verified that I am speaking with the correct person using two identifiers.  Today's Provider: Talitha Givens, MHS, PA-C Introduced myself to the patient as a PA-C and provided education on APPs in clinical practice.   Location: Patient: at home, [name], [name]  Provider: [place], [name]    I discussed the limitations of evaluation and management by telemedicine and the availability of in person appointments. The patient expressed understanding and agreed to proceed.  Chief Complaint  Patient presents with   Diarrhea    Pt had diarrhea/cramping for the past 2 days unsure if it was food poisoning. As of today sx disappeared     History of Present Illness:   Onset: sudden Duration: day before yesterday  Reports intestinal cramping, feeling bloated and gassy, diarrhea  Reports she cooked scallops and has been eating for a few days - thinks they were cooked appropriately and have been refrigerated  Reports she has had to call out of work due to symptoms Reports today she had a solid bowel movement  Interventions: Gas-X, Imodium x 1 dose, bland diet    Review of Systems  Constitutional:  Negative for chills, fever and malaise/fatigue.  Gastrointestinal:  Positive for abdominal pain and diarrhea. Negative for blood in stool, heartburn, melena, nausea and vomiting.  Musculoskeletal:  Negative for myalgias.      Observations/Objective:  Due to the nature of the virtual visit, physical exam and observations are limited. Able to obtain the following observations:   Alert, oriented x3  Appears comfortable, in no acute distress.  No scleral injection, no appreciated hoarseness, tachypnea, wheeze or strider. Able to maintain conversation without visible strain.  No cough appreciated during visit.    Assessment  and Plan:   Problem List Items Addressed This Visit   None Visit Diagnoses     Gastroenteritis due to food toxin    -  Primary Acute, resolving Reports roughly 24 hours of diarrhea, abdominal cramping and bloating that seem to be improving as of this morning Recommend increased hydration efforts, bland diet until normal diet is tolerable  Reviewed use of OTC medications- do not recommend continued use of Imodium at this time to allow for expulsion of potential toxins Follow up as needed for persistent or progressing symptoms      Follow Up Instructions:    I discussed the assessment and treatment plan with the patient. The patient was provided an opportunity to ask questions and all were answered. The patient agreed with the plan and demonstrated an understanding of the instructions.   The patient was advised to call back or seek an in-person evaluation if the symptoms worsen or if the condition fails to improve as anticipated.  I provided 12 minutes of non-face-to-face time during this encounter.   No follow-ups on file.   I, Kasumi Ditullio E Laurance Heide, PA-C, have reviewed all documentation for this visit. The documentation on 02/07/22 for the exam, diagnosis, procedures, and orders are all accurate and complete.   Talitha Givens, MHS, PA-C [facility]

## 2022-02-07 NOTE — Patient Instructions (Addendum)
Make sure you are staying well hydrated with plenty of water  You can add an electrolyte supplement to this - like Pedialyte or Gatorade  Make sure to have a bland diet - boiled chicken, rice, toast, bananas, applesauce  You can slowly reintroduce normal foods as you are able to tolerate   Please let us know if you are having fevers, chills, blood in your stool, continued diarrhea, concern for dehydration

## 2022-02-12 ENCOUNTER — Other Ambulatory Visit: Payer: Self-pay | Admitting: Family Medicine

## 2022-02-12 DIAGNOSIS — B3731 Acute candidiasis of vulva and vagina: Secondary | ICD-10-CM

## 2022-02-13 MED ORDER — FLUCONAZOLE 150 MG PO TABS: 150.0000 mg | ORAL_TABLET | ORAL | 0 refills | Status: DC

## 2022-03-14 ENCOUNTER — Other Ambulatory Visit: Payer: Self-pay | Admitting: Family Medicine

## 2022-03-14 DIAGNOSIS — J4520 Mild intermittent asthma, uncomplicated: Secondary | ICD-10-CM

## 2022-03-14 DIAGNOSIS — E559 Vitamin D deficiency, unspecified: Secondary | ICD-10-CM

## 2022-05-09 ENCOUNTER — Encounter: Payer: Self-pay | Admitting: Family Medicine

## 2022-05-10 NOTE — Telephone Encounter (Signed)
PA in progress. Will contact patient when determination has been reached.

## 2022-05-17 ENCOUNTER — Other Ambulatory Visit: Payer: Self-pay

## 2022-05-17 ENCOUNTER — Encounter: Payer: Self-pay | Admitting: Family Medicine

## 2022-05-17 ENCOUNTER — Other Ambulatory Visit: Payer: Self-pay | Admitting: Family Medicine

## 2022-05-17 DIAGNOSIS — E538 Deficiency of other specified B group vitamins: Secondary | ICD-10-CM

## 2022-05-17 MED ORDER — "BD LUER-LOK SYRINGE 25G X 1"" 3 ML MISC": 1.0000 | Freq: Every day | 5 refills | Status: DC

## 2022-06-13 ENCOUNTER — Other Ambulatory Visit: Payer: Self-pay | Admitting: Family Medicine

## 2022-06-13 DIAGNOSIS — E669 Obesity, unspecified: Secondary | ICD-10-CM

## 2022-06-14 ENCOUNTER — Other Ambulatory Visit: Payer: Self-pay | Admitting: Family Medicine

## 2022-06-14 DIAGNOSIS — B3731 Acute candidiasis of vulva and vagina: Secondary | ICD-10-CM

## 2022-07-06 NOTE — Progress Notes (Unsigned)
Name: Claire [name]   MRN: [id number]    DOB: [birth year]   Date:07/09/2022       Progress Note  Subjective  Chief Complaint  Follow Up  HPI  Major Depression: she was seeing Dr. Maryruth Bun who is also getting therapy.  She is off Zolot and is currently on Trintelix 20 mg and off Lunesta and taking Trazodone for sleep.  She had side effects with Ambien ( interrupted sleep and sleep walking) ,  or Temazepam ( groggy ).  ,Dr. Maryruth Bun was given Adderall but she was drinking alcohol and it was placed on hold, she cannot afford going back to Dr. Maryruth Bun. She is getting rx from me, phq 9 is better, she is now working as an Arboriculturist . We gave her provigil but she is not sure if she is taking it at this time .  Asthma Mild Intermittentt: doing well at this timeshe takes symbicort prn    Migraine: she is still seeing Dr. Neale Burly - she is on  Zonegran and also gets triggers point injections , prn  Symptoms are stable, on average 1 migraine episodes per month, she has tension headaches intermittent 3-4 , symptoms are stable.    DMII: diagnosed at age 54, took medication for a while, but stopped all medications 10/25/2010 after bariatric surgery.  Maximum weight of 265 lbs and was down to 118.3 lbs - lowest weight back in 08/2017), she was gaining weight since she started to feel better after umbilical hernia repair with  lysis of adhesions and cecopexy back in 04/01/2018, she had a bariatric surgery revision 11/03/2019 by Dr. Daphine Deutscher, at that time weight was 196 lb, and it went down to 184 lbs and is now gradually gaining it back, , stable since last visit around 215 lbs. She is back on Ozempic since A1C A1C today is 6.2 %. She has been drinking beer daily and V8 juice, explained that it contains a lot of calories and also alcohol misuse is dangerous  Elevated LFT's: she did not return for labs, she states she resumed drinking beer with B8 daily, 3-4 beers per day and more on weekends. Husband has asked her to  cut down on amount. Discussed diagnosis of alcoholism, increase risk of cirrhosis for her since she already has metabolic syndrome and NASH is also a reason for liver cirrhosis. Must cut down on intake and consider quitting   IBS/GERD history of bypass surgery:  She was doing well after  lysis of adhesions and cecopexy back in 04/01/2018. She has intermittent esophageal pain - odynophagia and resolves with regurgitation. 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 No side effects LDL goal is below 100 and it was 102. We will recheck LFT's before adjusting medication dose   B12 deficiency: she has been getting B12 injections bi-monthly .    Thyroiditis: she is under the care of Dr. Gershon Crane Unchanged    Dizziness/syncope: seen at [facility], and has seen Dr. Christie Beckers 03/2019 , they ruled out POTS but may have had vasovagal syncope. She is doing better, seeing cardiologist now Currently seeing Dr. Sherryll Burger at [facility], first EEG negative - it was ordered due to jerky movements, but had a second study done in August and results are pending, MRI brain early 2023 was normal. Unchanged   History of COVID: diagnosed 04/21 and again 05/2021   she still has mental  fogginess, and feel tired , she also has decrease in exercise tolerance. She was referred to neuropsychiatrist by neurologist, still waiting for appointment. 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/03/2019   B12 deficiency 04/10/2018   Columnar epithelial-lined lower esophagus    Postprocedural disorder of digestive system    Esophageal dysphagia    Persistent mood (affective) disorder, unspecified (HCC) 01/15/2018   Insomnia related to another mental disorder 01/15/2018   OCD (obsessive compulsive disorder) 10/29/2017   GAD (generalized anxiety disorder) 10/29/2017   Nephrolithiasis 10/22/2017   Atherosclerosis of aorta (HCC) 12/21/2016   Hiatal hernia 08/17/2016   Diverticulosis of colon 08/17/2016   Lumbosacral pain 01/07/2015   Intertrigo 11/05/2014   Perennial allergic rhinitis 09/03/2014   Lung nodules 09/03/2014   Vitamin D deficiency 09/03/2014   History of kidney stones 09/03/2014   History of iron deficiency anemia 09/03/2014   Mild persistent asthma without complication 11/21/2012   GERD without esophagitis 11/21/2012   History of hyperlipidemia 11/21/2012   Migraine with aura and without status migrainosus 11/21/2012   History of sleep apnea 11/21/2012   IBS (irritable bowel syndrome) 08/15/2012    Past Surgical History:  Procedure Laterality Date   BLADDER SURGERY  2010   BREAST BIOPSY Right 2014   stereotatic biopsy   CHOLECYSTECTOMY  2010   COLONOSCOPY  2014    Done at [facility]   COLONOSCOPY WITH PROPOFOL N/A 01/28/2018   Procedure: COLONOSCOPY WITH PROPOFOL;  Surgeon: Pasty Spillers, MD;  Location: [facility];  Service: Endoscopy;  Laterality: N/A;   DILITATION & CURRETTAGE/HYSTROSCOPY WITH NOVASURE ABLATION N/A 06/16/2015   Procedure: DILATATION & CURETTAGE/HYSTEROSCOPY WITH NOVASURE ABLATION;  Surgeon: Olivia Mackie, MD;  Location: [facility];  Service: Gynecology;  Laterality: N/A;   ESOPHAGOGASTRODUODENOSCOPY (EGD) WITH PROPOFOL N/A 01/28/2018   Procedure: ESOPHAGOGASTRODUODENOSCOPY (EGD) WITH BIOPSIES;  Surgeon:  Pasty Spillers, MD;  Location: [facility];  Service: Endoscopy;  Laterality: N/A;   GASTRIC ROUX-EN-Y N/A 11/03/2019   Procedure: LAPAROSCOPIC 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 WITH UPPER ENDOSCOPY;  Surgeon: Luretha Murphy, MD;  Location: [facility];  Service: General;  Laterality: N/A;   HIATAL HERNIA REPAIR N/A 11/03/2019   Procedure: HERNIA REPAIR HIATAL;  Surgeon: Luretha Murphy, MD;  Location: [facility];  Service: General;  Laterality: N/A;   LAPAROSCOPIC GASTRIC SLEEVE RESECTION  2012   LAPAROSCOPY N/A 06/16/2015   Procedure: LAPAROSCOPY DIAGNOSTIC;  Surgeon: Olivia Mackie, MD;  Location: [facility];  Service: Gynecology;  Laterality: N/A;   LAPAROSCOPY N/A 04/01/2018   Procedure: LAPAROSCOPY DIAGNOSTIC ERAS PATHWAY ENTEROLYSIS, CECOPEXY;  Surgeon: Luretha Murphy, MD;  Location: [facility];  Service: General;  Laterality: N/A;   LITHOTRIPSY  12/11/2017   laser lithotripsy   LYSIS OF ADHESION N/A 06/16/2015   Procedure: LYSIS OF ADHESION;  Surgeon: Olivia Mackie, MD;  Location: [facility];  Service: Gynecology;  Laterality: N/A;   PLANTAR FASCIA SURGERY Bilateral    ROBOTIC ASSISTED SALPINGO OOPHERECTOMY Right 06/16/2015   Procedure: ROBOTIC ASSISTED SALPINGO OOPHORECTOMY, EXCISION OF RIGHT CUL DE SAC MASS;  Surgeon: Olivia Mackie, MD;  Location: [facility];  Service: Gynecology;  Laterality: Right;   TONSILLECTOMY     UPPER GI ENDOSCOPY      Family History  Problem Relation Age of Onset   Heart attack Mother    Stroke Mother    Multiple myeloma Mother    Hyperlipidemia Father    Heart attack Sister    Heart attack Maternal Uncle    Heart attack Paternal Grandfather    Breast cancer Paternal Aunt    Breast cancer Paternal Grandmother    Lung cancer Maternal Grandfather     Social History   Tobacco Use   Smoking status: Former    Packs/day: 1.00    Years: 10.00    Additional pack years: 0.00    Total pack years: 10.00    Types: Cigarettes     Quit date: 02/19/1993    Years since quitting: 29.4   Smokeless tobacco: Never  Substance Use Topics   Alcohol use: Yes    Alcohol/week: 0.0 standard drinks of alcohol    Comment: occasionally     Current Outpatient Medications:    Accu-Chek FastClix Lancets MISC, USE TO TEST UP TO 4 TIMES A DAY, Disp: , Rfl:    ACCU-CHEK GUIDE test strip, , Disp: , Rfl:    albuterol (VENTOLIN HFA) 108 (90 Base) MCG/ACT inhaler, TAKE 2 PUFFS EVERY 6 HOURS AS NEEDED FOR WHEEZING OR SHORTNESS OF BREATH. (VENTOLIN NOT COVERED), Disp: 8.5 each, Rfl: 2   aspirin EC 81 MG tablet, Take 1 tablet (81 mg total) by mouth daily. Swallow whole., Disp: 90 tablet, Rfl: 3   calcium citrate-vitamin D (CALCIUM CITRATE CHEWY BITE) 500-500 MG-UNIT chewable tablet, Chew 1 tablet by mouth 3 (three) times daily., Disp: , Rfl:    clobetasol cream (TEMOVATE) 0.05 %, SMARTSIG:Sparingly Topical Twice Daily, Disp: , Rfl:    clobetasol ointment (TEMOVATE) 0.05 %, Apply 1 application topically daily as needed (psoriasis). , Disp: , Rfl: 0   cyclobenzaprine (FLEXERIL) 10 MG tablet, Take 10 mg by mouth at bedtime as needed for muscle spasms., Disp: , Rfl:    fluconazole (DIFLUCAN) 150 MG tablet, TAKE 1 TABLET (150 MG) BY MOUTH EVERY OTHER DAY, Disp: 3 tablet, Rfl: 0   fluocinonide (LIDEX) 0.05 % external solution, Apply 1 application topically 2 (two) times daily as needed (psoriasis). , Disp: , Rfl:    IRON-VITAMIN C PO, Take 1 tablet by mouth daily., Disp: , Rfl:    ketoconazole (NIZORAL) 2 % shampoo, Apply 1 application topically 2 (two) times daily as needed (psoriasis). , Disp: , Rfl:    Lactobacillus (ACIDOPHILUS PO), Take 1 capsule by mouth daily. 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 , Disp: , Rfl: 4   mometasone (NASONEX) 50 MCG/ACT nasal spray, Place 2 sprays into the nose daily., Disp: 3 each, Rfl: 1   Multiple  Vitamins-Minerals (MULTIVITAMIN ADULT) CHEW, Chew 1 tablet by mouth daily., Disp: , Rfl:    ondansetron (ZOFRAN) 4 MG tablet, Take 4 mg by mouth every 8 (eight) hours as needed., Disp: , Rfl:    pantoprazole (PROTONIX) 40 MG tablet, Take 40 mg by mouth daily., Disp: , Rfl:    SYRINGE-NEEDLE, DISP,  3 ML (B-D 3CC LUER-LOK SYR 25GX1") 25G X 1" 3 ML MISC, Inject 1 each into the skin daily at 12 noon., Disp: 1 each, Rfl: 5   triamcinolone cream (KENALOG) 0.1 %, Apply 1 application topically 2 (two) times daily., Disp: 453.6 g, Rfl: 0   zonisamide (ZONEGRAN) 50 MG capsule, Take 150 mg by mouth daily. , Disp: , Rfl: 2   atorvastatin (LIPITOR) 40 MG tablet, Take 1 tablet (40 mg total) by mouth daily., Disp: 30 tablet, Rfl: 11   budesonide-formoterol (SYMBICORT) 160-4.5 MCG/ACT inhaler, INHALE 2 PUFFS INTO THE LUNGS TWICE A DAY, Disp: 10.2 each, Rfl: 2   cyanocobalamin (VITAMIN B12) 1000 MCG/ML injection, Inject 1 mL (1,000 mcg total) into the muscle every 14 (fourteen) days., Disp: 6 mL, Rfl: 1   modafinil (PROVIGIL) 100 MG tablet, Take 1 tablet (100 mg total) by mouth daily., Disp: 90 tablet, Rfl: 0   montelukast (SINGULAIR) 10 MG tablet, Take 1 tablet (10 mg total) by mouth at bedtime. TAKE 1 TABLET(10 MG) BY MOUTH AT BEDTIME, Disp: 90 tablet, Rfl: 3   pregabalin (LYRICA) 300 MG capsule, Take 1 capsule (300 mg total) by mouth 2 (two) times daily., Disp: 180 capsule, Rfl: 1   Semaglutide, 1 MG/DOSE, (OZEMPIC, 1 MG/DOSE,) 4 MG/3ML SOPN, Inject 1 mg into the skin once a week., Disp: 6 mL, Rfl: 1   traZODone (DESYREL) 100 MG tablet, Take 2 tablets (200 mg total) by mouth at bedtime., Disp: 180 tablet, Rfl: 1   TRINTELLIX 20 MG TABS tablet, Take 1 tablet (20 mg total) by mouth daily., Disp: 90 tablet, Rfl: 1   [START ON 07/14/2022] Vitamin D, Ergocalciferol, (DRISDOL) 1.25 MG (50000 UNIT) CAPS capsule, Take 1 capsule (50,000 Units total) by mouth every Saturday., Disp: 12 capsule, Rfl: 1  Allergies  Allergen  Reactions   Aspartame And Phenylalanine Nausea Only and Other (See Comments)    GI upset    Gabapentin     Bladder incontinence at higher dose   Glucose Nausea Only and Nausea And Vomiting   Linzess [Linaclotide]     Worsening of diarrhea   Other Hives and Other (See Comments)    Ricotta Cheese: flushed and hot   Triamcinolone Other (See Comments)   Cymbalta [Duloxetine Hcl] Palpitations    And photosensitivity   Tape Rash    Some clear tapes   Tapentadol Rash    Some clear tapes    I personally reviewed active problem list, medication list, allergies, family history, social history, health maintenance with the patient/caregiver today.   ROS  Constitutional: Negative for fever or weight change.  Respiratory: Negative for cough and shortness of breath.   Cardiovascular: Negative for chest pain or palpitations.  Gastrointestinal: Negative for abdominal pain, no bowel changes.  Musculoskeletal: Negative for gait problem or joint swelling.  Skin: Negative for rash.  Neurological: Negative for dizziness or headache.  No other specific complaints in a complete review of systems (except as listed in HPI above).   Objective  Vitals:   07/09/22 0838  BP: 124/72  Pulse: 77  Resp: 16  Temp: 97.6 F (36.4 C)  TempSrc: Oral  SpO2: 96%  Weight: 216 lb 4.8 oz (98.1 kg)  Height: 5\' 1"  (1.549 m)    Body mass index is 40.87 kg/m.  Physical Exam  Constitutional: Patient appears well-developed and well-nourished. Obese  No distress.  HEENT: head atraumatic, normocephalic, pupils equal and reactive to light, neck supple Cardiovascular: Normal rate, regular rhythm and normal  heart sounds.  No murmur heard. No BLE edema. Pulmonary/Chest: Effort normal and breath sounds normal. No respiratory distress. Abdominal: Soft.  There is no tenderness. Psychiatric: Patient has a normal mood and affect. behavior is normal. Judgment and thought content normal.   Recent Results (from the past  2160 hour(s))  POCT HgB A1C     Status: Abnormal   Collection Time: 07/09/22  8:41 AM  Result Value Ref Range   Hemoglobin A1C 6.2 (A) 4.0 - 5.6 %   HbA1c POC (<> result, manual entry)     HbA1c, POC (prediabetic range)     HbA1c, POC (controlled diabetic range)      Diabetic Foot Exam: Diabetic Foot Exam - Simple   Simple Foot Form Visual Inspection No deformities, no ulcerations, no other skin breakdown bilaterally: Yes Sensation Testing Intact to touch and monofilament testing bilaterally: Yes Pulse Check Posterior Tibialis and Dorsalis pulse intact bilaterally: Yes Comments      PHQ2/9:    07/09/2022    8:40 AM 02/07/2022    8:10 AM 01/08/2022   11:38 AM 12/15/2021   11:26 AM 10/10/2021    1:58 PM  Depression screen PHQ 2/9  Decreased Interest 0 0 0 0 0  Down, Depressed, Hopeless 0 0 0 0 0  PHQ - 2 Score 0 0 0 0 0  Altered sleeping 0 0 0 0 0  Tired, decreased energy 0 0 0 0 0  Change in appetite 0 0 0 0 0  Feeling bad or failure about yourself  0 0 0 0 0  Trouble concentrating 0 0 0 0 3  Moving slowly or fidgety/restless 0 0 0 0 0  Suicidal thoughts 0 0 0 0 0  PHQ-9 Score 0 0 0 0 3  Difficult doing work/chores  Not difficult at all  Not difficult at all     phq 9 is negative   Fall Risk:    07/09/2022    8:40 AM 02/07/2022    8:09 AM 01/08/2022   11:38 AM 12/15/2021   11:26 AM 10/10/2021    1:58 PM  Fall Risk   Falls in the past year? 0 0 0 0 0  Number falls in past yr:  0 0 0 0  Injury with Fall?  0 0 0 0  Risk for fall due to : No Fall Risks No Fall Risks No Fall Risks  No Fall Risks  Follow up Falls prevention discussed Education provided;Falls evaluation completed;Falls prevention discussed Falls prevention discussed  Falls prevention discussed    Functional Status Survey: Is the patient deaf or have difficulty hearing?: Yes Does the patient have difficulty seeing, even when wearing glasses/contacts?: No Does the patient have difficulty  concentrating, remembering, or making decisions?: No Does the patient have difficulty walking or climbing stairs?: Yes Does the patient have difficulty dressing or bathing?: No Does the patient have difficulty doing errands alone such as visiting a doctor's office or shopping?: No    Assessment & Plan  1. Type 2 diabetes mellitus with obesity (HCC)  - POCT HgB A1C - HM Diabetes Foot Exam - Semaglutide, 1 MG/DOSE, (OZEMPIC, 1 MG/DOSE,) 4 MG/3ML SOPN; Inject 1 mg into the skin once a week.  Dispense: 6 mL; Refill: 1  2. Persistent mood (affective) disorder, unspecified (HCC)  - TRINTELLIX 20 MG TABS tablet; Take 1 tablet (20 mg total) by mouth daily.  Dispense: 90 tablet; Refill: 1  3. Atherosclerosis of aorta (HCC)  On statin therapy  4. Morbid obesity with BMI of 40.0-44.9, adult [facility])  Discussed with the patient the risk posed by an increased BMI. Discussed importance of portion control, calorie counting and at least 150 minutes of physical activity weekly. Avoid sweet beverages and drink more water. Eat at least 6 servings of fruit and vegetables daily    5. B12 deficiency  - cyanocobalamin (VITAMIN B12) 1000 MCG/ML injection; Inject 1 mL (1,000 mcg total) into the muscle every 14 (fourteen) days.  Dispense: 6 mL; Refill: 1  6. Fibromyalgia  - pregabalin (LYRICA) 300 MG capsule; Take 1 capsule (300 mg total) by mouth 2 (two) times daily.  Dispense: 180 capsule; Refill: 1  7. Mild persistent asthma without complication  - budesonide-formoterol (SYMBICORT) 160-4.5 MCG/ACT inhaler; INHALE 2 PUFFS INTO THE LUNGS TWICE A DAY  Dispense: 10.2 each; Refill: 2 - montelukast (SINGULAIR) 10 MG tablet; Take 1 tablet (10 mg total) by mouth at bedtime. TAKE 1 TABLET(10 MG) BY MOUTH AT BEDTIME  Dispense: 90 tablet; Refill: 3  8. Attention deficit hyperactivity disorder (ADHD), unspecified ADHD type  - modafinil (PROVIGIL) 100 MG tablet; Take 1 tablet (100 mg total) by mouth daily.   Dispense: 90 tablet; Refill: 0  9. Vitamin D deficiency  - Vitamin D, Ergocalciferol, (DRISDOL) 1.25 MG (50000 UNIT) CAPS capsule; Take 1 capsule (50,000 Units total) by mouth every Saturday.  Dispense: 12 capsule; Refill: 1  10. Chronic insomnia  - traZODone (DESYREL) 100 MG tablet; Take 2 tablets (200 mg total) by mouth at bedtime.  Dispense: 180 tablet; Refill: 1  11. Elevated liver enzymes  - COMPLETE METABOLIC PANEL WITH GFR - Hepatitis, Acute   Discussed importance of cutting down on alcohol, start by drinking no more than 2 beers per day

## 2022-07-09 ENCOUNTER — Ambulatory Visit (INDEPENDENT_AMBULATORY_CARE_PROVIDER_SITE_OTHER): Payer: PRIVATE HEALTH INSURANCE | Admitting: Family Medicine

## 2022-07-09 ENCOUNTER — Encounter: Payer: Self-pay | Admitting: Family Medicine

## 2022-07-09 VITALS — BP 124/72 | HR 77 | Temp 97.6°F | Resp 16 | Ht 61.0 in | Wt 216.3 lb

## 2022-07-09 DIAGNOSIS — E1169 Type 2 diabetes mellitus with other specified complication: Secondary | ICD-10-CM

## 2022-07-09 DIAGNOSIS — F909 Attention-deficit hyperactivity disorder, unspecified type: Secondary | ICD-10-CM

## 2022-07-09 DIAGNOSIS — I700 Atherosclerosis of aorta: Secondary | ICD-10-CM | POA: Diagnosis not present

## 2022-07-09 DIAGNOSIS — E669 Obesity, unspecified: Secondary | ICD-10-CM | POA: Diagnosis not present

## 2022-07-09 DIAGNOSIS — R748 Abnormal levels of other serum enzymes: Secondary | ICD-10-CM

## 2022-07-09 DIAGNOSIS — Z7984 Long term (current) use of oral hypoglycemic drugs: Secondary | ICD-10-CM

## 2022-07-09 DIAGNOSIS — F5104 Psychophysiologic insomnia: Secondary | ICD-10-CM

## 2022-07-09 DIAGNOSIS — M797 Fibromyalgia: Secondary | ICD-10-CM

## 2022-07-09 DIAGNOSIS — Z6841 Body Mass Index (BMI) 40.0 and over, adult: Secondary | ICD-10-CM

## 2022-07-09 DIAGNOSIS — J4530 Mild persistent asthma, uncomplicated: Secondary | ICD-10-CM

## 2022-07-09 DIAGNOSIS — E538 Deficiency of other specified B group vitamins: Secondary | ICD-10-CM | POA: Diagnosis not present

## 2022-07-09 DIAGNOSIS — F349 Persistent mood [affective] disorder, unspecified: Secondary | ICD-10-CM | POA: Diagnosis not present

## 2022-07-09 DIAGNOSIS — E559 Vitamin D deficiency, unspecified: Secondary | ICD-10-CM

## 2022-07-09 LAB — POCT GLYCOSYLATED HEMOGLOBIN (HGB A1C): Hemoglobin A1C: 6.2 % — AB (ref 4.0–5.6)

## 2022-07-09 MED ORDER — TRAZODONE HCL 100 MG PO TABS
200.0000 mg | ORAL_TABLET | Freq: Every day | ORAL | 1 refills | Status: DC
Start: 2022-07-09 — End: 2022-12-03

## 2022-07-09 MED ORDER — TRINTELLIX 20 MG PO TABS: 20.0000 mg | ORAL_TABLET | Freq: Every day | ORAL | 1 refills | Status: DC

## 2022-07-09 MED ORDER — BUDESONIDE-FORMOTEROL FUMARATE 160-4.5 MCG/ACT IN AERO: INHALATION_SPRAY | RESPIRATORY_TRACT | 2 refills | Status: DC

## 2022-07-09 MED ORDER — VITAMIN D (ERGOCALCIFEROL) 1.25 MG (50000 UNIT) PO CAPS
50000.0000 [IU] | ORAL_CAPSULE | ORAL | 1 refills | Status: DC
Start: 2022-07-14 — End: 2022-12-03

## 2022-07-09 MED ORDER — CYANOCOBALAMIN 1000 MCG/ML IJ SOLN: 1000.0000 ug | INTRAMUSCULAR | 1 refills | Status: DC

## 2022-07-09 MED ORDER — CYANOCOBALAMIN 1000 MCG/ML IJ SOLN
1000.0000 ug | Freq: Once | INTRAMUSCULAR | Status: AC
Start: 2022-07-09 — End: 2022-07-09
  Administered 2022-07-09: 1000 ug via INTRAMUSCULAR

## 2022-07-09 MED ORDER — MONTELUKAST SODIUM 10 MG PO TABS: 10.0000 mg | ORAL_TABLET | Freq: Every day | ORAL | 3 refills | Status: DC

## 2022-07-09 MED ORDER — MODAFINIL 100 MG PO TABS: 100.0000 mg | ORAL_TABLET | Freq: Every day | ORAL | 0 refills | Status: DC

## 2022-07-09 MED ORDER — PREGABALIN 300 MG PO CAPS
300.0000 mg | ORAL_CAPSULE | Freq: Two times a day (BID) | ORAL | 1 refills | Status: DC
Start: 2022-07-09 — End: 2022-12-03

## 2022-07-09 MED ORDER — OZEMPIC (1 MG/DOSE) 4 MG/3ML ~~LOC~~ SOPN: 1.0000 mg | PEN_INJECTOR | SUBCUTANEOUS | 1 refills | Status: DC

## 2022-07-09 NOTE — Addendum Note (Signed)
Addended by: Benay Pike on: 07/09/2022 09:25 AM   Modules accepted: Orders

## 2022-07-09 NOTE — Patient Instructions (Signed)
[facility] [address], [place], [place] [address]  Phone: [phone] Fax: [phone]

## 2022-07-10 LAB — COMPLETE METABOLIC PANEL WITH GFR
AG Ratio: 1.7 (calc) (ref 1.0–2.5)
ALT: 65 U/L — ABNORMAL HIGH (ref 6–29)
AST: 45 U/L — ABNORMAL HIGH (ref 10–35)
Albumin: 4 g/dL (ref 3.6–5.1)
Alkaline phosphatase (APISO): 78 U/L (ref 31–125)
BUN: 8 mg/dL (ref 7–25)
CO2: 25 mmol/L (ref 20–32)
Calcium: 9.3 mg/dL (ref 8.6–10.2)
Chloride: 102 mmol/L (ref 98–110)
Creat: 0.58 mg/dL (ref 0.50–0.99)
Globulin: 2.4 g/dL (calc) (ref 1.9–3.7)
Glucose, Bld: 115 mg/dL — ABNORMAL HIGH (ref 65–99)
Potassium: 4.3 mmol/L (ref 3.5–5.3)
Sodium: 139 mmol/L (ref 135–146)
Total Bilirubin: 0.6 mg/dL (ref 0.2–1.2)
Total Protein: 6.4 g/dL (ref 6.1–8.1)
eGFR: 111 mL/min/{1.73_m2} (ref >=60)

## 2022-07-10 LAB — HEPATITIS PANEL, ACUTE
Hep A IgM: NONREACTIVE
Hep B C IgM: NONREACTIVE
Hepatitis B Surface Ag: NONREACTIVE
Hepatitis C Ab: NONREACTIVE

## 2022-08-01 ENCOUNTER — Encounter: Payer: Self-pay | Admitting: Family Medicine

## 2022-09-11 ENCOUNTER — Other Ambulatory Visit: Payer: Self-pay | Admitting: Family Medicine

## 2022-09-11 DIAGNOSIS — J302 Other seasonal allergic rhinitis: Secondary | ICD-10-CM

## 2022-09-12 ENCOUNTER — Ambulatory Visit: Payer: Self-pay

## 2022-09-12 ENCOUNTER — Telehealth: Payer: Self-pay | Admitting: Family Medicine

## 2022-09-12 DIAGNOSIS — E1169 Type 2 diabetes mellitus with other specified complication: Secondary | ICD-10-CM

## 2022-09-12 NOTE — Telephone Encounter (Signed)
     Chief Complaint: Low back pain and hip pain. Weakness in legs. Has seen chiropractor.  Symptoms: Above Frequency: 4 - 6 weeks Pertinent Negatives: Patient denies  Disposition: [] ED /[] Urgent Care (no appt availability in office) / [x] Appointment(In office/virtual)/ []  [facility]/ [] Home Care/ [] Refused Recommended Disposition /[] [facility]/ []  Follow-up with PCP Additional Notes: Pt. Agrees with appointment.  Reason for Disposition  [1] MODERATE back pain (e.g., interferes with normal activities) AND [2] present > 3 days  Answer Assessment - Initial Assessment Questions 1. ONSET: "When did the pain begin?"      4-6 weeks 2. LOCATION: "Where does it hurt?" (upper, mid or lower back)     Lower and hips 3. SEVERITY: "How bad is the pain?"  (e.g., Scale 1-10; mild, moderate, or severe)   - MILD (1-3): Doesn't interfere with normal activities.    - MODERATE (4-7): Interferes with normal activities or awakens from sleep.    - SEVERE (8-10): Excruciating pain, unable to do any normal activities.      Severe 4. PATTERN: "Is the pain constant?" (e.g., yes, no; constant, intermittent)      Constant 5. RADIATION: "Does the pain shoot into your legs or somewhere else?"     Hips 6. CAUSE:  "What do you think is causing the back pain?"      Unsure 7. BACK OVERUSE:  "Any recent lifting of heavy objects, strenuous work or exercise?"     No 8. MEDICINES: "What have you taken so far for the pain?" (e.g., nothing, acetaminophen, NSAIDS)     N/a 9. NEUROLOGIC SYMPTOMS: "Do you have any weakness, numbness, or problems with bowel/bladder control?"     Weakness 10. OTHER SYMPTOMS: "Do you have any other symptoms?" (e.g., fever, abdomen pain, burning with urination, blood in urine)       No 11. PREGNANCY: "Is there any chance you are pregnant?" "When was your last menstrual period?"       No  Protocols used: Back Pain-A-AH

## 2022-09-12 NOTE — Telephone Encounter (Signed)
Medication Refill - Medication:   Patient wanted PCP to know that she picked up 2 boxes of Semaglutide, 1 MG/DOSE, (OZEMPIC, 1 MG/DOSE,) 4 MG/3ML SOPN . Patient states she normally gets a 3 month supply.    Has the patient contacted their pharmacy? Pharmacy advised next script has to reflect reflect 9ml with 3 refills.    Preferred Pharmacy (with phone number or street name):  [id number] [place] [address] Phone: [phone]  Fax: [phone]       Has the patient been seen for an appointment in the last year OR does the patient have an upcoming appointment? Yes.    Agent: Please be advised that RX refills may take up to 3 business days. We ask that you follow-up with your pharmacy.    Patient picked up the 2 boxes last week    As per pharmacy states Semaglutide, 1 MG/DOSE, (OZEMPIC, 1 MG/DOSE,) 4 MG/3ML SOPN      [id number] [place], [place] [address] Phone: [phone]  Fax: [phone]

## 2022-09-13 ENCOUNTER — Ambulatory Visit: Payer: PRIVATE HEALTH INSURANCE | Admitting: Family Medicine

## 2022-09-13 NOTE — Telephone Encounter (Signed)
Called patient and made aware. Patient gave verbal understanding.

## 2022-09-14 ENCOUNTER — Ambulatory Visit (INDEPENDENT_AMBULATORY_CARE_PROVIDER_SITE_OTHER): Payer: PRIVATE HEALTH INSURANCE | Admitting: Family Medicine

## 2022-09-14 ENCOUNTER — Encounter: Payer: Self-pay | Admitting: Family Medicine

## 2022-09-14 VITALS — BP 136/76 | HR 95 | Temp 98.0°F | Resp 18 | Ht 61.0 in | Wt 217.3 lb

## 2022-09-14 DIAGNOSIS — M5441 Lumbago with sciatica, right side: Secondary | ICD-10-CM

## 2022-09-14 DIAGNOSIS — G8929 Other chronic pain: Secondary | ICD-10-CM | POA: Diagnosis not present

## 2022-09-14 MED ORDER — PREDNISONE 20 MG PO TABS
40.0000 mg | ORAL_TABLET | Freq: Every day | ORAL | 0 refills | Status: DC
Start: 2022-09-14 — End: 2022-10-01

## 2022-09-14 NOTE — Progress Notes (Signed)
Patient ID: Claire [name], female    DOB: 1970-08-16, 52 y.o.   MRN: [id number]  PCP: Alba Cory, MD  Chief Complaint  Patient presents with   Back Pain    Low back radiating into hips    Subjective:   Claire [name] is a 52 y.o. female, presents to clinic with CC of the following:  Back Pain This is a recurrent problem. The current episode started in the past 7 days. The problem occurs constantly. The problem has been gradually improving since onset. The pain is present in the gluteal, lumbar spine and sacro-iliac. The quality of the pain is described as shooting. Radiates to: right leg. The pain is moderate. Exacerbated by: with activity, working. Pertinent negatives include no abdominal pain, bladder incontinence, bowel incontinence, numbness or weakness. Risk factors include obesity. She has tried NSAIDs, muscle relaxant, analgesics, chiropractic manipulation and bed rest for the symptoms. The treatment provided no relief.      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/03/2019   B12 deficiency 04/10/2018   Columnar epithelial-lined lower esophagus    Postprocedural disorder of digestive system    Esophageal dysphagia    Persistent mood (affective) disorder, unspecified (HCC) 01/15/2018   Insomnia related to another mental disorder 01/15/2018   OCD (obsessive compulsive disorder) 10/29/2017   GAD (generalized anxiety disorder) 10/29/2017   Nephrolithiasis 10/22/2017   Atherosclerosis of aorta (HCC) 12/21/2016   Hiatal hernia 08/17/2016   Diverticulosis of colon 08/17/2016    Lumbosacral pain 01/07/2015   Intertrigo 11/05/2014   Perennial allergic rhinitis 09/03/2014   Lung nodules 09/03/2014   Vitamin D deficiency 09/03/2014   History of kidney stones 09/03/2014   History of iron deficiency anemia 09/03/2014   Mild persistent asthma without complication 11/21/2012   GERD without esophagitis 11/21/2012   History of hyperlipidemia 11/21/2012   Migraine with aura and without status migrainosus 11/21/2012   History of sleep apnea 11/21/2012   IBS (irritable bowel syndrome) 08/15/2012      Current Outpatient Medications:    Accu-Chek FastClix Lancets MISC, USE TO TEST UP TO 4 TIMES A DAY, Disp: , Rfl:    ACCU-CHEK GUIDE test strip, , Disp: , Rfl:    albuterol (VENTOLIN HFA) 108 (90 Base) MCG/ACT inhaler, TAKE 2 PUFFS EVERY 6 HOURS AS NEEDED FOR WHEEZING OR SHORTNESS OF BREATH. (VENTOLIN NOT COVERED), Disp: 8.5 each, Rfl: 2   aspirin EC 81 MG tablet, Take 1 tablet (81 mg total) by mouth daily. Swallow whole., Disp: 90 tablet, Rfl: 3   budesonide-formoterol (SYMBICORT) 160-4.5 MCG/ACT inhaler, INHALE 2 PUFFS INTO THE LUNGS TWICE A DAY, Disp: 10.2 each, Rfl: 2   calcium citrate-vitamin D (CALCIUM CITRATE CHEWY BITE) 500-500 MG-UNIT chewable tablet, Chew 1 tablet by mouth 3 (three) times daily., Disp: , Rfl:    clobetasol cream (TEMOVATE) 0.05 %, SMARTSIG:Sparingly Topical Twice Daily, Disp: , Rfl:    clobetasol ointment (TEMOVATE) 0.05 %, Apply 1 application topically daily as needed (psoriasis). , Disp: , Rfl: 0   cyanocobalamin (VITAMIN B12) 1000 MCG/ML injection, Inject 1 mL (1,000 mcg total) into the  muscle every 14 (fourteen) days., Disp: 6 mL, Rfl: 1   cyclobenzaprine (FLEXERIL) 10 MG tablet, Take 10 mg by mouth at bedtime as needed for muscle spasms., Disp: , Rfl:    fluconazole (DIFLUCAN) 150 MG tablet, TAKE 1 TABLET (150 MG) BY MOUTH EVERY OTHER DAY, Disp: 3 tablet, Rfl: 0   fluocinonide (LIDEX) 0.05 % external solution, Apply 1 application topically 2  (two) times daily as needed (psoriasis). , Disp: , Rfl:    IRON-VITAMIN C PO, Take 1 tablet by mouth daily., Disp: , Rfl:    ketoconazole (NIZORAL) 2 % shampoo, Apply 1 application topically 2 (two) times daily as needed (psoriasis). , Disp: , Rfl:    Lactobacillus (ACIDOPHILUS PO), Take 1 capsule by mouth daily. Probiotic plus Calcium, Disp: , Rfl:    modafinil (PROVIGIL) 100 MG tablet, Take 1 tablet (100 mg total) by mouth daily., Disp: 90 tablet, Rfl: 0   mometasone (ELOCON) 0.1 % lotion, Apply 1 application topically daily as needed (psoriasis in the ears). , Disp: , Rfl: 4   mometasone (NASONEX) 50 MCG/ACT nasal spray, PLACE 2 SPRAYS INTO THE NOSE DAILY., Disp: 51 each, Rfl: 1   montelukast (SINGULAIR) 10 MG tablet, Take 1 tablet (10 mg total) by mouth at bedtime. TAKE 1 TABLET(10 MG) BY MOUTH AT BEDTIME, Disp: 90 tablet, Rfl: 3   Multiple Vitamins-Minerals (MULTIVITAMIN ADULT) CHEW, Chew 1 tablet by mouth daily., Disp: , Rfl:    ondansetron (ZOFRAN) 4 MG tablet, Take 4 mg by mouth every 8 (eight) hours as needed., Disp: , Rfl:    pantoprazole (PROTONIX) 40 MG tablet, Take 40 mg by mouth daily., Disp: , Rfl:    predniSONE (DELTASONE) 20 MG tablet, Take 2 tablets (40 mg total) by mouth daily., Disp: 10 tablet, Rfl: 0   pregabalin (LYRICA) 300 MG capsule, Take 1 capsule (300 mg total) by mouth 2 (two) times daily., Disp: 180 capsule, Rfl: 1   Semaglutide, 1 MG/DOSE, (OZEMPIC, 1 MG/DOSE,) 4 MG/3ML SOPN, Inject 1 mg into the skin once a week., Disp: 6 mL, Rfl: 1   SYRINGE-NEEDLE, DISP, 3 ML (B-D 3CC LUER-LOK SYR 25GX1") 25G X 1" 3 ML MISC, Inject 1 each into the skin daily at 12 noon., Disp: 1 each, Rfl: 5   traZODone (DESYREL) 100 MG tablet, Take 2 tablets (200 mg total) by mouth at bedtime., Disp: 180 tablet, Rfl: 1   triamcinolone cream (KENALOG) 0.1 %, Apply 1 application topically 2 (two) times daily., Disp: 453.6 g, Rfl: 0   TRINTELLIX 20 MG TABS tablet, Take 1 tablet (20 mg total) by mouth  daily., Disp: 90 tablet, Rfl: 1   Vitamin D, Ergocalciferol, (DRISDOL) 1.25 MG (50000 UNIT) CAPS capsule, Take 1 capsule (50,000 Units total) by mouth every Saturday., Disp: 12 capsule, Rfl: 1   zonisamide (ZONEGRAN) 50 MG capsule, Take 150 mg by mouth daily. , Disp: , Rfl: 2   atorvastatin (LIPITOR) 40 MG tablet, Take 1 tablet (40 mg total) by mouth daily., Disp: 30 tablet, Rfl: 11   Allergies  Allergen Reactions   Aspartame And Phenylalanine Nausea Only and Other (See Comments)    GI upset    Gabapentin     Bladder incontinence at higher dose   Glucose Nausea Only and Nausea And Vomiting   Linzess [Linaclotide]     Worsening of diarrhea   Other Hives and Other (See Comments)    Ricotta Cheese: flushed and hot   Triamcinolone Other (See Comments)   Cymbalta [Duloxetine Hcl]  Palpitations    And photosensitivity   Tape Rash    Some clear tapes   Tapentadol Rash    Some clear tapes     Social History   Tobacco Use   Smoking status: Former    Current packs/day: 0.00    Average packs/day: 1 pack/day for 10.0 years (10.0 ttl pk-yrs)    Types: Cigarettes    Start date: 02/20/1983    Quit date: 02/19/1993    Years since quitting: 29.5   Smokeless tobacco: Never  Vaping Use   Vaping status: Never Used  Substance Use Topics   Alcohol use: Yes    Alcohol/week: 0.0 standard drinks of alcohol    Comment: occasionally   Drug use: No      Chart Review Today: I personally reviewed active problem list, medication list, allergies, family history, social history, health maintenance, notes from last encounter, lab results, imaging with the patient/caregiver today.   Review of Systems  Constitutional: Negative.   HENT: Negative.    Eyes: Negative.   Respiratory: Negative.    Cardiovascular: Negative.   Gastrointestinal: Negative.  Negative for abdominal pain and bowel incontinence.  Endocrine: Negative.   Genitourinary: Negative.  Negative for bladder incontinence.   Musculoskeletal:  Positive for back pain.  Skin: Negative.   Allergic/Immunologic: Negative.   Neurological: Negative.  Negative for weakness and numbness.  Hematological: Negative.   Psychiatric/Behavioral: Negative.    All other systems reviewed and are negative.      Objective:   Vitals:   09/14/22 1456  BP: 136/76  Pulse: 95  Resp: 18  Temp: 98 F (36.7 C)  SpO2: 97%  Weight: 217 lb 4.8 oz (98.6 kg)  Height: 5\' 1"  (1.549 m)    Body mass index is 41.06 kg/m.  Physical Exam Vitals and nursing note reviewed.  Constitutional:      General: She is not in acute distress.    Appearance: Normal appearance. She is well-developed. She is obese. She is not ill-appearing, toxic-appearing or diaphoretic.  HENT:     Head: Normocephalic and atraumatic.     Nose: Nose normal.  Eyes:     General:        Right eye: No discharge.        Left eye: No discharge.     Conjunctiva/sclera: Conjunctivae normal.  Neck:     Trachea: No tracheal deviation.  Cardiovascular:     Rate and Rhythm: Normal rate and regular rhythm.  Pulmonary:     Effort: Pulmonary effort is normal. No respiratory distress.     Breath sounds: No stridor.  Musculoskeletal:        General: Normal range of motion.     Cervical back: Normal.     Thoracic back: Normal.     Lumbar back: No spasms, tenderness or bony tenderness. Normal range of motion. Negative right straight leg raise test and negative left straight leg raise test.     Comments: No midline tenderness from cervical to lumbar spine, no step off No paraspinal muscle ttp from cervical to lumbar spine Grossly normal sensation to light touch to bilateral lower extremities Antalgic gait    Skin:    General: Skin is warm and dry.     Findings: No rash.  Neurological:     Mental Status: She is alert.     Motor: No abnormal muscle tone.     Coordination: Coordination normal.  Psychiatric:        Behavior: Behavior normal.  Results for  orders placed or performed in visit on 07/09/22  COMPLETE METABOLIC PANEL WITH GFR  Result Value Ref Range   Glucose, Bld 115 (H) 65 - 99 mg/dL   BUN 8 7 - 25 mg/dL   Creat 1.61 0.96 - 0.45 mg/dL   eGFR 409 > OR = 60 WJ/XBJ/4.78G9   BUN/Creatinine Ratio SEE NOTE: 6 - 22 (calc)   Sodium 139 135 - 146 mmol/L   Potassium 4.3 3.5 - 5.3 mmol/L   Chloride 102 98 - 110 mmol/L   CO2 25 20 - 32 mmol/L   Calcium 9.3 8.6 - 10.2 mg/dL   Total Protein 6.4 6.1 - 8.1 g/dL   Albumin 4.0 3.6 - 5.1 g/dL   Globulin 2.4 1.9 - 3.7 g/dL (calc)   AG Ratio 1.7 1.0 - 2.5 (calc)   Total Bilirubin 0.6 0.2 - 1.2 mg/dL   Alkaline phosphatase (APISO) 78 31 - 125 U/L   AST 45 (H) 10 - 35 U/L   ALT 65 (H) 6 - 29 U/L  Hepatitis, Acute  Result Value Ref Range   Hep A IgM NON-REACTIVE NON-REACTIVE   Hepatitis B Surface Ag NON-REACTIVE NON-REACTIVE   Hep B C IgM NON-REACTIVE NON-REACTIVE   Hepatitis C Ab NON-REACTIVE NON-REACTIVE  POCT HgB A1C  Result Value Ref Range   Hemoglobin A1C 6.2 (A) 4.0 - 5.6 %   HbA1c POC (<> result, manual entry)     HbA1c, POC (prediabetic range)     HbA1c, POC (controlled diabetic range)         Assessment & Plan:   1. Chronic bilateral low back pain with right-sided sciatica Very slight right sided radicular sx that are better today than most of the last week Hx of low back pain for many years, seems to be worse for several weeks to months, no improvement with her available muscle relaxers, Lyrica, over-the-counter medicine such as ibuprofen and Tylenol She has been going to chiropractor repeatedly for several weeks without improvement She did see her neurologist and complained about back pain and x-rays were done last year in September she previously had x-rays and MRI done of her lumbar spine but that was in 2017 Will try a burst of steroids and she was encouraged to continue with her other home medications and to consult with physical therapy for assessment and treatment,  neurosurgery/spine/Ortho eval would also be helpful after many years nearly a decade of pain and prior imaging would defer MRI imaging to the specialist - predniSONE (DELTASONE) 20 MG tablet; Take 2 tablets (40 mg total) by mouth daily.  Dispense: 10 tablet; Refill: 0 - Ambulatory referral to Physical Therapy - Ambulatory referral to Neurosurgery  Offered a work note but she is off work this weekend Encouraged heat, gentle stretching, resting from strenuous work/bending/lifting  No ttp on exam today, no loss of sensation or strength and no red flags such as saddle anesthesia incontinence or stool or urine  F/up as needed   Danelle Berry, PA-C 09/14/22 4:07 PM

## 2022-09-19 NOTE — Progress Notes (Deleted)
Referring Physician:  Danelle Berry, PA-C [address] [place],  [place] [address]  Primary Physician:  Alba Cory, MD  History of Present Illness: 09/19/2022 [name] is here today with a chief complaint of ***  Low back pain that radiates into the right leg   Duration:  2 weeks Location: *** Quality:  shooting Severity: *** 10/10 Precipitating: aggravated by activity, working  Modifying factors: made better by *** Weakness: none Timing: constant Bowel/Bladder Dysfunction: none  Conservative measures: chiropractic manipulation and bed rest Physical therapy: ***has not participated in? Multimodal medical therapy including regular antiinflammatories: NSAIDS, Flexeril, Lyrica, prednisone Injections: *** no epidural steroid injections?  Past Surgery: ***none  [name] has ***no symptoms of cervical myelopathy.  The symptoms are causing a significant impact on the patient's life.   I have utilized the care everywhere function in epic to review the outside records available from external health systems.  Review of Systems:  A 10 point review of systems is negative, except for the pertinent positives and negatives detailed in the HPI.  Past Medical History: Past Medical History:  Diagnosis Date   Anemia    Anxiety    Arthritis    hands, hips   Asthma    Bilateral leg cramps    Depression    Diabetes mellitus without complication (HCC)    history no longer a problem since weight loss surgery   DJD (degenerative joint disease)    Dyspnea on exertion    a. 07/2019 Echo: EF 60-65%, no rwma, nl RV fxn, triv MR.   GERD (gastroesophageal reflux disease)    Headache    Migraines   IBS (irritable bowel syndrome)    Kidney stone    Low serum vitamin B12    Lung nodules    Migraine    down to approx 4x/mo since starting meds   Muscle spasm    Nonobstructive CAD (coronary artery disease)    a. 06/2014 ETT: Ex time 8:59, max HR  155, 10.1 METS, no ST/T changes; b. 08/2019 Cor CTA: Ca2+ 175 (99th %'ile), LAD 35-49p, LCX 0-47m-->Med rx.   Psoriasis    vaginal area   Seasonal allergies    Sleep apnea    history no longer a problem since weight loss surgery   Vasovagal syncope    [place], Dr. Ples Specter    Vitamin D deficiency     Past Surgical History: Past Surgical History:  Procedure Laterality Date   BLADDER SURGERY  2010   BREAST BIOPSY Right 2014   stereotatic biopsy   CHOLECYSTECTOMY  2010   COLONOSCOPY  2014    Done at [facility]   COLONOSCOPY WITH PROPOFOL N/A 01/28/2018   Procedure: COLONOSCOPY WITH PROPOFOL;  Surgeon: Pasty Spillers, MD;  Location: [facility];  Service: Endoscopy;  Laterality: N/A;   DILITATION & CURRETTAGE/HYSTROSCOPY WITH NOVASURE ABLATION N/A 06/16/2015   Procedure: DILATATION & CURETTAGE/HYSTEROSCOPY WITH NOVASURE ABLATION;  Surgeon: Olivia Mackie, MD;  Location: [facility];  Service: Gynecology;  Laterality: N/A;   ESOPHAGOGASTRODUODENOSCOPY (EGD) WITH PROPOFOL N/A 01/28/2018   Procedure: ESOPHAGOGASTRODUODENOSCOPY (EGD) WITH BIOPSIES;  Surgeon: Pasty Spillers, MD;  Location: [facility];  Service: Endoscopy;  Laterality: N/A;   GASTRIC ROUX-EN-Y N/A 11/03/2019   Procedure: LAPAROSCOPIC 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 WITH UPPER ENDOSCOPY;  Surgeon: Luretha Murphy, MD;  Location: [facility];  Service: General;  Laterality: N/A;   HIATAL HERNIA REPAIR N/A 11/03/2019   Procedure: HERNIA  REPAIR HIATAL;  Surgeon: Luretha Murphy, MD;  Location: [facility];  Service: General;  Laterality: N/A;   LAPAROSCOPIC GASTRIC SLEEVE RESECTION  2012   LAPAROSCOPY N/A 06/16/2015   Procedure: LAPAROSCOPY DIAGNOSTIC;  Surgeon: Olivia Mackie, MD;  Location: [facility];  Service: Gynecology;  Laterality: N/A;   LAPAROSCOPY N/A 04/01/2018   Procedure: LAPAROSCOPY DIAGNOSTIC ERAS PATHWAY ENTEROLYSIS, CECOPEXY;  Surgeon: Luretha Murphy, MD;  Location: [facility];   Service: General;  Laterality: N/A;   LITHOTRIPSY  12/11/2017   laser lithotripsy   LYSIS OF ADHESION N/A 06/16/2015   Procedure: LYSIS OF ADHESION;  Surgeon: Olivia Mackie, MD;  Location: [facility];  Service: Gynecology;  Laterality: N/A;   PLANTAR FASCIA SURGERY Bilateral    ROBOTIC ASSISTED SALPINGO OOPHERECTOMY Right 06/16/2015   Procedure: ROBOTIC ASSISTED SALPINGO OOPHORECTOMY, EXCISION OF RIGHT CUL DE SAC MASS;  Surgeon: Olivia Mackie, MD;  Location: [facility];  Service: Gynecology;  Laterality: Right;   TONSILLECTOMY     UPPER GI ENDOSCOPY      Allergies: Allergies as of 09/27/2022 - Review Complete 09/14/2022  Allergen Reaction Noted   Aspartame and phenylalanine Nausea Only and Other (See Comments) 06/02/2019   Gabapentin  05/16/2016   Glucose Nausea Only and Nausea And Vomiting 05/21/2019   Linzess [linaclotide]  11/11/2015   Other Hives and Other (See Comments) 06/30/2012   Triamcinolone Other (See Comments) 07/10/2021   Cymbalta [duloxetine hcl] Palpitations 09/24/2014   Tape Rash 12/11/2017   Tapentadol Rash 01/27/2018    Medications:  Current Outpatient Medications:    Accu-Chek FastClix Lancets MISC, USE TO TEST UP TO 4 TIMES A DAY, Disp: , Rfl:    ACCU-CHEK GUIDE test strip, , Disp: , Rfl:    albuterol (VENTOLIN HFA) 108 (90 Base) MCG/ACT inhaler, TAKE 2 PUFFS EVERY 6 HOURS AS NEEDED FOR WHEEZING OR SHORTNESS OF BREATH. (VENTOLIN NOT COVERED), Disp: 8.5 each, Rfl: 2   aspirin EC 81 MG tablet, Take 1 tablet (81 mg total) by mouth daily. Swallow whole., Disp: 90 tablet, Rfl: 3   atorvastatin (LIPITOR) 40 MG tablet, Take 1 tablet (40 mg total) by mouth daily., Disp: 30 tablet, Rfl: 11   budesonide-formoterol (SYMBICORT) 160-4.5 MCG/ACT inhaler, INHALE 2 PUFFS INTO THE LUNGS TWICE A DAY, Disp: 10.2 each, Rfl: 2   calcium citrate-vitamin D (CALCIUM CITRATE CHEWY BITE) 500-500 MG-UNIT chewable tablet, Chew 1 tablet by mouth 3 (three) times daily., Disp: , Rfl:    clobetasol  cream (TEMOVATE) 0.05 %, SMARTSIG:Sparingly Topical Twice Daily, Disp: , Rfl:    clobetasol ointment (TEMOVATE) 0.05 %, Apply 1 application topically daily as needed (psoriasis). , Disp: , Rfl: 0   cyanocobalamin (VITAMIN B12) 1000 MCG/ML injection, Inject 1 mL (1,000 mcg total) into the muscle every 14 (fourteen) days., Disp: 6 mL, Rfl: 1   cyclobenzaprine (FLEXERIL) 10 MG tablet, Take 10 mg by mouth at bedtime as needed for muscle spasms., Disp: , Rfl:    fluconazole (DIFLUCAN) 150 MG tablet, TAKE 1 TABLET (150 MG) BY MOUTH EVERY OTHER DAY, Disp: 3 tablet, Rfl: 0   fluocinonide (LIDEX) 0.05 % external solution, Apply 1 application topically 2 (two) times daily as needed (psoriasis). , Disp: , Rfl:    IRON-VITAMIN C PO, Take 1 tablet by mouth daily., Disp: , Rfl:    ketoconazole (NIZORAL) 2 % shampoo, Apply 1 application topically 2 (two) times daily as needed (psoriasis). , Disp: , Rfl:    Lactobacillus (ACIDOPHILUS PO), Take 1 capsule by mouth daily. Probiotic plus Calcium,  Disp: , Rfl:    modafinil (PROVIGIL) 100 MG tablet, Take 1 tablet (100 mg total) by mouth daily., Disp: 90 tablet, Rfl: 0   mometasone (ELOCON) 0.1 % lotion, Apply 1 application topically daily as needed (psoriasis in the ears). , Disp: , Rfl: 4   mometasone (NASONEX) 50 MCG/ACT nasal spray, PLACE 2 SPRAYS INTO THE NOSE DAILY., Disp: 51 each, Rfl: 1   montelukast (SINGULAIR) 10 MG tablet, Take 1 tablet (10 mg total) by mouth at bedtime. TAKE 1 TABLET(10 MG) BY MOUTH AT BEDTIME, Disp: 90 tablet, Rfl: 3   Multiple Vitamins-Minerals (MULTIVITAMIN ADULT) CHEW, Chew 1 tablet by mouth daily., Disp: , Rfl:    ondansetron (ZOFRAN) 4 MG tablet, Take 4 mg by mouth every 8 (eight) hours as needed., Disp: , Rfl:    pantoprazole (PROTONIX) 40 MG tablet, Take 40 mg by mouth daily., Disp: , Rfl:    predniSONE (DELTASONE) 20 MG tablet, Take 2 tablets (40 mg total) by mouth daily., Disp: 10 tablet, Rfl: 0   pregabalin (LYRICA) 300 MG capsule,  Take 1 capsule (300 mg total) by mouth 2 (two) times daily., Disp: 180 capsule, Rfl: 1   Semaglutide, 1 MG/DOSE, (OZEMPIC, 1 MG/DOSE,) 4 MG/3ML SOPN, Inject 1 mg into the skin once a week., Disp: 6 mL, Rfl: 1   SYRINGE-NEEDLE, DISP, 3 ML (B-D 3CC LUER-LOK SYR 25GX1") 25G X 1" 3 ML MISC, Inject 1 each into the skin daily at 12 noon., Disp: 1 each, Rfl: 5   traZODone (DESYREL) 100 MG tablet, Take 2 tablets (200 mg total) by mouth at bedtime., Disp: 180 tablet, Rfl: 1   triamcinolone cream (KENALOG) 0.1 %, Apply 1 application topically 2 (two) times daily., Disp: 453.6 g, Rfl: 0   TRINTELLIX 20 MG TABS tablet, Take 1 tablet (20 mg total) by mouth daily., Disp: 90 tablet, Rfl: 1   Vitamin D, Ergocalciferol, (DRISDOL) 1.25 MG (50000 UNIT) CAPS capsule, Take 1 capsule (50,000 Units total) by mouth every Saturday., Disp: 12 capsule, Rfl: 1   zonisamide (ZONEGRAN) 50 MG capsule, Take 150 mg by mouth daily. , Disp: , Rfl: 2  Social History: Social History   Tobacco Use   Smoking status: Former    Current packs/day: 0.00    Average packs/day: 1 pack/day for 10.0 years (10.0 ttl pk-yrs)    Types: Cigarettes    Start date: 02/20/1983    Quit date: 02/19/1993    Years since quitting: 29.6   Smokeless tobacco: Never  Vaping Use   Vaping status: Never Used  Substance Use Topics   Alcohol use: Yes    Alcohol/week: 0.0 standard drinks of alcohol    Comment: occasionally   Drug use: No    Family Medical History: Family History  Problem Relation Age of Onset   Heart attack Mother    Stroke Mother    Multiple myeloma Mother    Hyperlipidemia Father    Heart attack Sister    Heart attack Maternal Uncle    Heart attack Paternal Grandfather    Breast cancer Paternal Aunt    Breast cancer Paternal Grandmother    Lung cancer Maternal Grandfather     Physical Examination: There were no vitals filed for this visit.  General: Patient is in no apparent distress. Attention to examination is  appropriate.  Neck:   Supple.  Full range of motion.  Respiratory: Patient is breathing without any difficulty.   NEUROLOGICAL:     Awake, alert, oriented to person, place, and  time.  Speech is clear and fluent.   Cranial Nerves: Pupils equal round and reactive to light.  Facial tone is symmetric.  Facial sensation is symmetric. Shoulder shrug is symmetric. Tongue protrusion is midline.    Strength: Side Biceps Triceps Deltoid Interossei Grip Wrist Ext. Wrist Flex.  R 5 5 5 5 5 5 5   L 5 5 5 5 5 5 5    Side Iliopsoas Quads Hamstring PF DF EHL  R 5 5 5 5 5 5   L 5 5 5 5 5 5    Reflexes are ***2+ and symmetric at the biceps, triceps, brachioradialis, patella and achilles.   Hoffman's is absent. Clonus is absent  Bilateral upper and lower extremity sensation is intact to light touch ***.     No evidence of dysmetria noted.  Gait is normal.    Imaging: *** I have personally reviewed the images and agree with the above interpretation.  Medical Decision Making/Assessment and Plan: Ms. Recktenwald is a pleasant 52 y.o. female with ***  There are no diagnoses linked to this encounter.   Thank you for involving me in the care of this patient.    Lovenia Kim MD/MSCR Neurosurgery

## 2022-09-25 ENCOUNTER — Telehealth: Payer: Self-pay | Admitting: Cardiology

## 2022-09-25 NOTE — Telephone Encounter (Signed)
Pt c/o of Chest Pain: STAT if CP now or developed within 24 hours  1. Are you having CP right now? no  2. Are you experiencing any other symptoms (ex. SOB, nausea, vomiting, sweating)? sweatomg  3. How long have you been experiencing CP? This morning  4. Is your CP continuous or coming and going? Coming and going   5. Have you taken Nitroglycerin? No  Patient states the pain is on the right side of her chest ?

## 2022-09-25 NOTE — Telephone Encounter (Signed)
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  She also reported sharp chest pain that she stated lasted roughly 3 minutes.  Pt stated the pain was severe to where she felt like she couldn't catch her breath. She reported she took a baby aspirin to help relieve symptoms HR 98-103  Pt reported since the first initial episode, she's had off and on chest tightness and weakness.  Pt reported currently she feels ok.  Appointment scheduled first available on 8/21. Nurse recommended pt report to ER if symptoms reoccur Pt verbalized understanding.  Will forward to MD for any further recommendations.

## 2022-09-26 ENCOUNTER — Other Ambulatory Visit: Payer: Self-pay | Admitting: Family Medicine

## 2022-09-26 ENCOUNTER — Inpatient Hospital Stay
Admission: RE | Admit: 2022-09-26 | Discharge: 2022-09-26 | Disposition: A | Discharge status: Home / Self Care | Payer: Self-pay | Source: Ambulatory Visit | Attending: Neurosurgery | Admitting: Neurosurgery

## 2022-09-26 DIAGNOSIS — Z049 Encounter for examination and observation for unspecified reason: Secondary | ICD-10-CM

## 2022-09-27 ENCOUNTER — Ambulatory Visit: Payer: PRIVATE HEALTH INSURANCE | Admitting: Neurosurgery

## 2022-09-28 LAB — HM MAMMOGRAPHY

## 2022-10-01 ENCOUNTER — Encounter: Payer: Self-pay | Admitting: Neurosurgery

## 2022-10-01 ENCOUNTER — Telehealth: Payer: Self-pay

## 2022-10-01 ENCOUNTER — Ambulatory Visit
Admission: RE | Admit: 2022-10-01 | Discharge: 2022-10-01 | Disposition: A | Discharge status: Home / Self Care | Payer: PRIVATE HEALTH INSURANCE | Attending: Neurosurgery | Admitting: Neurosurgery

## 2022-10-01 ENCOUNTER — Ambulatory Visit (INDEPENDENT_AMBULATORY_CARE_PROVIDER_SITE_OTHER): Payer: PRIVATE HEALTH INSURANCE | Admitting: Neurosurgery

## 2022-10-01 ENCOUNTER — Ambulatory Visit
Admission: RE | Admit: 2022-10-01 | Discharge: 2022-10-01 | Disposition: A | Discharge status: Home / Self Care | Payer: PRIVATE HEALTH INSURANCE | Source: Ambulatory Visit | Attending: Neurosurgery | Admitting: Neurosurgery

## 2022-10-01 VITALS — BP 115/80 | Ht 61.0 in | Wt 217.8 lb

## 2022-10-01 DIAGNOSIS — M47816 Spondylosis without myelopathy or radiculopathy, lumbar region: Secondary | ICD-10-CM | POA: Diagnosis not present

## 2022-10-01 DIAGNOSIS — G8929 Other chronic pain: Secondary | ICD-10-CM | POA: Insufficient documentation

## 2022-10-01 DIAGNOSIS — M545 Low back pain, unspecified: Secondary | ICD-10-CM | POA: Insufficient documentation

## 2022-10-01 DIAGNOSIS — M25559 Pain in unspecified hip: Secondary | ICD-10-CM | POA: Diagnosis not present

## 2022-10-01 NOTE — Progress Notes (Signed)
Referring Physician:  Danelle Berry, PA-C [address] [place],  [place] [address]  Primary Physician:  Alba Cory, MD  History of Present Illness: 10/01/2022 [name] is here today with a chief complaint of back and hip pain.  She states that that she has been dealing with this for at least 15 years but has gotten worse over the past 6 months.  She feels like her pain is predominantly in her hips but sometimes in her paramedian low back.  Thankfully she does not have any radiating symptoms down into her legs.  No bowel or bladder changes.  No loss of sensory function.  Change to her overall strength.  She does get some swelling in her right lower extremity that is worse than her left.  She states that she feels the symptoms worse when she stands up or lowers herself down.  It also bothers her significantly when climbing stairs both up and down.  She feels like this can be worse with working and prolonged driving.  She does feel better with some heat.  She has worked with a [facility].  Has not worked with physical therapist.  Has not had epidural steroid injections.  Is on NSAIDs, Flexeril, Lyrica, prednisone.  She has not had any previous spine surgery.  I have utilized the care everywhere function in epic to review the outside records available from external health systems.  Review of Systems:  Neurologic review of systems negative unless otherwise specified above  Past Medical History: Past Medical History:  Diagnosis Date   Anemia    Anxiety    Arthritis    hands, hips   Asthma    Bilateral leg cramps    Depression    Diabetes mellitus without complication (HCC)    history no longer a problem since weight loss surgery   DJD (degenerative joint disease)    Dyspnea on exertion    a. 07/2019 Echo: EF 60-65%, no rwma, nl RV fxn, triv MR.   GERD (gastroesophageal reflux disease)    Headache    Migraines   IBS (irritable bowel syndrome)    Kidney  stone    Low serum vitamin B12    Lung nodules    Migraine    down to approx 4x/mo since starting meds   Muscle spasm    Nonobstructive CAD (coronary artery disease)    a. 06/2014 ETT: Ex time 8:59, max HR 155, 10.1 METS, no ST/T changes; b. 08/2019 Cor CTA: Ca2+ 175 (99th %'ile), LAD 35-49p, LCX 0-30m-->Med rx.   Psoriasis    vaginal area   Seasonal allergies    Sleep apnea    history no longer a problem since weight loss surgery   Vasovagal syncope    [place], Dr. Ples Specter    Vitamin D deficiency     Past Surgical History: Past Surgical History:  Procedure Laterality Date   BLADDER SURGERY  2010   BREAST BIOPSY Right 2014   stereotatic biopsy   CHOLECYSTECTOMY  2010   COLONOSCOPY  2014    [facility] at [facility]   COLONOSCOPY WITH PROPOFOL N/A 01/28/2018   Procedure: COLONOSCOPY WITH PROPOFOL;  Surgeon: Pasty Spillers, MD;  Location: [facility];  Service: Endoscopy;  Laterality: N/A;   DILITATION & CURRETTAGE/HYSTROSCOPY WITH NOVASURE ABLATION N/A 06/16/2015   Procedure: DILATATION & CURETTAGE/HYSTEROSCOPY WITH NOVASURE ABLATION;  Surgeon: Olivia Mackie, MD;  Location: [facility];  Service: Gynecology;  Laterality: N/A;   ESOPHAGOGASTRODUODENOSCOPY (EGD) WITH PROPOFOL N/A  01/28/2018   Procedure: ESOPHAGOGASTRODUODENOSCOPY (EGD) WITH BIOPSIES;  Surgeon: Pasty Spillers, MD;  Location: [facility];  Service: Endoscopy;  Laterality: N/A;   GASTRIC ROUX-EN-Y N/A 11/03/2019   Procedure: LAPAROSCOPIC 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 WITH UPPER ENDOSCOPY;  Surgeon: Luretha Murphy, MD;  Location: [facility];  Service: General;  Laterality: N/A;   HIATAL HERNIA REPAIR N/A 11/03/2019   Procedure: HERNIA REPAIR HIATAL;  Surgeon: Luretha Murphy, MD;  Location: [facility];  Service: General;  Laterality: N/A;   LAPAROSCOPIC GASTRIC SLEEVE RESECTION  2012   LAPAROSCOPY N/A 06/16/2015   Procedure: LAPAROSCOPY DIAGNOSTIC;  Surgeon: Olivia Mackie, MD;   Location: [facility];  Service: Gynecology;  Laterality: N/A;   LAPAROSCOPY N/A 04/01/2018   Procedure: LAPAROSCOPY DIAGNOSTIC ERAS PATHWAY ENTEROLYSIS, CECOPEXY;  Surgeon: Luretha Murphy, MD;  Location: [facility];  Service: General;  Laterality: N/A;   LITHOTRIPSY  12/11/2017   laser lithotripsy   LYSIS OF ADHESION N/A 06/16/2015   Procedure: LYSIS OF ADHESION;  Surgeon: Olivia Mackie, MD;  Location: [facility];  Service: Gynecology;  Laterality: N/A;   PLANTAR FASCIA SURGERY Bilateral    ROBOTIC ASSISTED SALPINGO OOPHERECTOMY Right 06/16/2015   Procedure: ROBOTIC ASSISTED SALPINGO OOPHORECTOMY, EXCISION OF RIGHT CUL DE SAC MASS;  Surgeon: Olivia Mackie, MD;  Location: [facility];  Service: Gynecology;  Laterality: Right;   TONSILLECTOMY     UPPER GI ENDOSCOPY      Allergies: Allergies as of 10/01/2022 - Review Complete 09/14/2022  Allergen Reaction Noted   Aspartame and phenylalanine Nausea Only and Other (See Comments) 06/02/2019   Gabapentin  05/16/2016   Glucose Nausea Only and Nausea And Vomiting 05/21/2019   Linzess [linaclotide]  11/11/2015   Other Hives and Other (See Comments) 06/30/2012   Triamcinolone Other (See Comments) 07/10/2021   Cymbalta [duloxetine hcl] Palpitations 09/24/2014   Tape Rash 12/11/2017   Tapentadol Rash 01/27/2018    Medications:  Current Outpatient Medications:    Accu-Chek FastClix Lancets MISC, USE TO TEST UP TO 4 TIMES A DAY, Disp: , Rfl:    ACCU-CHEK GUIDE test strip, , Disp: , Rfl:    albuterol (VENTOLIN HFA) 108 (90 Base) MCG/ACT inhaler, TAKE 2 PUFFS EVERY 6 HOURS AS NEEDED FOR WHEEZING OR SHORTNESS OF BREATH. (VENTOLIN NOT COVERED), Disp: 8.5 each, Rfl: 2   aspirin EC 81 MG tablet, Take 1 tablet (81 mg total) by mouth daily. Swallow whole., Disp: 90 tablet, Rfl: 3   atorvastatin (LIPITOR) 40 MG tablet, Take 1 tablet (40 mg total) by mouth daily., Disp: 30 tablet, Rfl: 11   budesonide-formoterol (SYMBICORT) 160-4.5 MCG/ACT inhaler, INHALE 2 PUFFS INTO THE  LUNGS TWICE A DAY, Disp: 10.2 each, Rfl: 2   calcium citrate-vitamin D (CALCIUM CITRATE CHEWY BITE) 500-500 MG-UNIT chewable tablet, Chew 1 tablet by mouth 3 (three) times daily., Disp: , Rfl:    clobetasol cream (TEMOVATE) 0.05 %, SMARTSIG:Sparingly Topical Twice Daily, Disp: , Rfl:    clobetasol ointment (TEMOVATE) 0.05 %, Apply 1 application topically daily as needed (psoriasis). , Disp: , Rfl: 0   cyanocobalamin (VITAMIN B12) 1000 MCG/ML injection, Inject 1 mL (1,000 mcg total) into the muscle every 14 (fourteen) days., Disp: 6 mL, Rfl: 1   cyclobenzaprine (FLEXERIL) 10 MG tablet, Take 10 mg by mouth at bedtime as needed for muscle spasms., Disp: , Rfl:    fluconazole (DIFLUCAN) 150 MG tablet, TAKE 1 TABLET (150 MG) BY MOUTH EVERY OTHER DAY, Disp: 3 tablet, Rfl: 0   fluocinonide (LIDEX)  0.05 % external solution, Apply 1 application topically 2 (two) times daily as needed (psoriasis). , Disp: , Rfl:    IRON-VITAMIN C PO, Take 1 tablet by mouth daily., Disp: , Rfl:    ketoconazole (NIZORAL) 2 % shampoo, Apply 1 application topically 2 (two) times daily as needed (psoriasis). , Disp: , Rfl:    Lactobacillus (ACIDOPHILUS PO), Take 1 capsule by mouth daily. Probiotic plus Calcium, Disp: , Rfl:    modafinil (PROVIGIL) 100 MG tablet, Take 1 tablet (100 mg total) by mouth daily., Disp: 90 tablet, Rfl: 0   mometasone (ELOCON) 0.1 % lotion, Apply 1 application topically daily as needed (psoriasis in the ears). , Disp: , Rfl: 4   mometasone (NASONEX) 50 MCG/ACT nasal spray, PLACE 2 SPRAYS INTO THE NOSE DAILY., Disp: 51 each, Rfl: 1   montelukast (SINGULAIR) 10 MG tablet, Take 1 tablet (10 mg total) by mouth at bedtime. TAKE 1 TABLET(10 MG) BY MOUTH AT BEDTIME, Disp: 90 tablet, Rfl: 3   Multiple Vitamins-Minerals (MULTIVITAMIN ADULT) CHEW, Chew 1 tablet by mouth daily., Disp: , Rfl:    ondansetron (ZOFRAN) 4 MG tablet, Take 4 mg by mouth every 8 (eight) hours as needed., Disp: , Rfl:    pantoprazole  (PROTONIX) 40 MG tablet, Take 40 mg by mouth daily., Disp: , Rfl:    predniSONE (DELTASONE) 20 MG tablet, Take 2 tablets (40 mg total) by mouth daily., Disp: 10 tablet, Rfl: 0   pregabalin (LYRICA) 300 MG capsule, Take 1 capsule (300 mg total) by mouth 2 (two) times daily., Disp: 180 capsule, Rfl: 1   Semaglutide, 1 MG/DOSE, (OZEMPIC, 1 MG/DOSE,) 4 MG/3ML SOPN, Inject 1 mg into the skin once a week., Disp: 6 mL, Rfl: 1   SYRINGE-NEEDLE, DISP, 3 ML (B-D 3CC LUER-LOK SYR 25GX1") 25G X 1" 3 ML MISC, Inject 1 each into the skin daily at 12 noon., Disp: 1 each, Rfl: 5   traZODone (DESYREL) 100 MG tablet, Take 2 tablets (200 mg total) by mouth at bedtime., Disp: 180 tablet, Rfl: 1   triamcinolone cream (KENALOG) 0.1 %, Apply 1 application topically 2 (two) times daily., Disp: 453.6 g, Rfl: 0   TRINTELLIX 20 MG TABS tablet, Take 1 tablet (20 mg total) by mouth daily., Disp: 90 tablet, Rfl: 1   Vitamin D, Ergocalciferol, (DRISDOL) 1.25 MG (50000 UNIT) CAPS capsule, Take 1 capsule (50,000 Units total) by mouth every Saturday., Disp: 12 capsule, Rfl: 1   zonisamide (ZONEGRAN) 50 MG capsule, Take 150 mg by mouth daily. , Disp: , Rfl: 2  Social History: Social History   Tobacco Use   Smoking status: Former    Current packs/day: 0.00    Average packs/day: 1 pack/day for 10.0 years (10.0 ttl pk-yrs)    Types: Cigarettes    Start date: 02/20/1983    Quit date: 02/19/1993    Years since quitting: 29.6   Smokeless tobacco: Never  Vaping Use   Vaping status: Never Used  Substance Use Topics   Alcohol use: Yes    Alcohol/week: 0.0 standard drinks of alcohol    Comment: occasionally   Drug use: No    Family Medical History: Family History  Problem Relation Age of Onset   Heart attack Mother    Stroke Mother    Multiple myeloma Mother    Hyperlipidemia Father    Heart attack Sister    Heart attack Maternal Uncle    Heart attack Paternal Grandfather    Breast cancer Paternal Aunt  Breast cancer  Paternal Grandmother    Lung cancer Maternal Grandfather     Physical Examination: There were no vitals filed for this visit.  General: Patient is in no apparent distress. Attention to examination is appropriate.  Neck:   Supple.  Full range of motion.  Respiratory: Patient is breathing without any difficulty.  Deep palpation of trochanteric bursa is positive, FAB ER is positive, FABIR is equivocal.  NEUROLOGICAL:     Awake, alert, oriented to person, place, and time.  Speech is clear and fluent.   Cranial Nerves: Pupils equal round and reactive to light.  Facial tone is symmetric.  Facial sensation is symmetric. Shoulder shrug is symmetric. Tongue protrusion is midline.    Strength:  Side Iliopsoas Quads Hamstring PF DF EHL  R 5 5 5 5 5 5   L 5 5 5 5 5 5    Reflexes are 2+ and symmetric at the biceps, triceps, brachioradialis, patella and and 1+ achilles. Clonus is absent  Bilateral upper and lower extremity sensation is intact to light touch    Gait is normal.    Imaging: I was able to evaluate her x-rays which showed some loss of lumba lordosis.  There is also appears to be a grade 1 spondylolisthesis at L4-5.  I have personally reviewed the images and agree with the above interpretation.  Medical Decision Making/Assessment and Plan: Ms. Menear is a pleasant 52 y.o. female with a history of back and hip pain.  She presents today for further evaluation.  She states that she has been dealing with some back pain for approximately 15 years but has noticed a worsening exacerbation of her back and hip pain over the past few months.  She does not have any clear signs or symptoms of radiculopathy or lumbar claudication.  Her hip pain does sound somewhat convincing for hip level pathology.  This is supported by positive trochanteric bursa palpation and FABER testing.  We do think that she would benefit from a referral to a orthopedic specialty for evaluation of her hip pain as she  may qualify for bursa injections and further workup.  It is unlikely this pain is due to her lumbar spondylosis.  In regards to her lumbar spondylosis she has a grade 1 spondylolisthesis at 4 5 but no significant L4-L5 dermatome/myotome weakness.  We like to get flexion-extension x-rays to evaluate for any pathologic slipping.  We would like to follow-up with her in approximately 3 months to follow-up on her back pain after she gets some of her hip issues addressed.  Thank you for involving me in the care of this patient.    Lovenia Kim MD/MSCR Neurosurgery

## 2022-10-01 NOTE — Telephone Encounter (Signed)
-----   Message from Ruel Favors sent at 10/01/2022  3:42 PM EDT ----- Regarding: FW: [facility] patient Please ask patient where she would like to go for Ortho evaluation ----- Message ----- From: Loreen Freud, MD Sent: 10/01/2022   3:30 PM EDT To: Alba Cory, MD Subject: [facility] patient                                 Thank so much for sending this nice lady over to see me.  After evaluating her I think she might be having more hip pathology than lumbar spine pathology.  I do think she would likely benefit from a referral to orthopedic surgery for evaluation for trochanteric bursitis or a primary hip issue.  Happy to see her back in follow-up to just check in and make sure that things are going well from a spine standpoint.  Thank so much, Apolinar Junes

## 2022-10-02 ENCOUNTER — Other Ambulatory Visit: Payer: Self-pay | Admitting: Family Medicine

## 2022-10-02 DIAGNOSIS — G8929 Other chronic pain: Secondary | ICD-10-CM

## 2022-10-02 DIAGNOSIS — M25551 Pain in right hip: Secondary | ICD-10-CM

## 2022-10-04 ENCOUNTER — Other Ambulatory Visit: Payer: Self-pay

## 2022-10-04 ENCOUNTER — Ambulatory Visit: Payer: PRIVATE HEALTH INSURANCE | Attending: Family Medicine | Admitting: Physical Therapy

## 2022-10-04 DIAGNOSIS — M5441 Lumbago with sciatica, right side: Secondary | ICD-10-CM | POA: Diagnosis not present

## 2022-10-04 DIAGNOSIS — R262 Difficulty in walking, not elsewhere classified: Secondary | ICD-10-CM | POA: Diagnosis present

## 2022-10-04 DIAGNOSIS — M5459 Other low back pain: Secondary | ICD-10-CM | POA: Diagnosis present

## 2022-10-04 DIAGNOSIS — G8929 Other chronic pain: Secondary | ICD-10-CM | POA: Diagnosis not present

## 2022-10-04 NOTE — Therapy (Signed)
OUTPATIENT PHYSICAL THERAPY THORACOLUMBAR EVALUATION   Patient Name: Claire [name] MRN: [id number] DOB:October 30, 1970, 52 y.o., female Today's Date: 10/04/2022  END OF SESSION:  PT End of Session - 10/04/22 1658     Visit Number 1    Number of Visits 20    Date for PT Re-Evaluation 12/13/22    Authorization Type [employer]    Authorization - Visit Number 1    Authorization - Number of Visits 20    Progress Note Due on Visit 10    PT Start Time 1345    PT Stop Time 1430    PT Time Calculation (min) 45 min    Activity Tolerance Patient limited by pain    Behavior During Therapy Restless;Agitated             Past Medical History:  Diagnosis Date   Anemia    Anxiety    Arthritis    hands, hips   Asthma    Bilateral leg cramps    Depression    Diabetes mellitus without complication (HCC)    history no longer a problem since weight loss surgery   DJD (degenerative joint disease)    Dyspnea on exertion    a. 07/2019 Echo: EF 60-65%, no rwma, nl RV fxn, triv MR.   GERD (gastroesophageal reflux disease)    Headache    Migraines   IBS (irritable bowel syndrome)    Kidney stone    Low serum vitamin B12    Lung nodules    Migraine    down to approx 4x/mo since starting meds   Muscle spasm    Nonobstructive CAD (coronary artery disease)    a. 06/2014 ETT: Ex time 8:59, max HR 155, 10.1 METS, no ST/T changes; b. 08/2019 Cor CTA: Ca2+ 175 (99th %'ile), LAD 35-49p, LCX 0-19m-->Med rx.   Psoriasis    vaginal area   Seasonal allergies    Sleep apnea    history no longer a problem since weight loss surgery   Vasovagal syncope    [place], Dr. Ples Specter    Vitamin D deficiency    Past Surgical History:  Procedure Laterality Date   BLADDER SURGERY  2010   BREAST BIOPSY Right 2014   stereotatic biopsy   CHOLECYSTECTOMY  2010   COLONOSCOPY  2014    Done at [facility]   COLONOSCOPY WITH PROPOFOL N/A 01/28/2018   Procedure: COLONOSCOPY WITH PROPOFOL;  Surgeon: Pasty Spillers, MD;  Location: [facility];  Service: Endoscopy;  Laterality: N/A;   DILITATION & CURRETTAGE/HYSTROSCOPY WITH NOVASURE ABLATION N/A 06/16/2015   Procedure: DILATATION & CURETTAGE/HYSTEROSCOPY WITH NOVASURE ABLATION;  Surgeon: Olivia Mackie, MD;  Location: [facility];  Service: Gynecology;  Laterality: N/A;   ESOPHAGOGASTRODUODENOSCOPY (EGD) WITH PROPOFOL N/A 01/28/2018   Procedure: ESOPHAGOGASTRODUODENOSCOPY (EGD) WITH BIOPSIES;  Surgeon: Pasty Spillers, MD;  Location: [facility];  Service: Endoscopy;  Laterality: N/A;   GASTRIC ROUX-EN-Y N/A 11/03/2019   Procedure: LAPAROSCOPIC 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 WITH UPPER ENDOSCOPY;  Surgeon: Luretha Murphy, MD;  Location: [facility];  Service: General;  Laterality: N/A;   HIATAL HERNIA REPAIR N/A 11/03/2019   Procedure: HERNIA REPAIR HIATAL;  Surgeon: Luretha Murphy, MD;  Location: [facility];  Service: General;  Laterality: N/A;   LAPAROSCOPIC GASTRIC SLEEVE RESECTION  2012   LAPAROSCOPY N/A 06/16/2015   Procedure: LAPAROSCOPY DIAGNOSTIC;  Surgeon: Olivia Mackie, MD;  Location: [facility];  Service: Gynecology;  Laterality: N/A;   LAPAROSCOPY N/A  04/01/2018   Procedure: LAPAROSCOPY DIAGNOSTIC ERAS PATHWAY ENTEROLYSIS, CECOPEXY;  Surgeon: Luretha Murphy, MD;  Location: [facility];  Service: General;  Laterality: N/A;   LITHOTRIPSY  12/11/2017   laser lithotripsy   LYSIS OF ADHESION N/A 06/16/2015   Procedure: LYSIS OF ADHESION;  Surgeon: Olivia Mackie, MD;  Location: [facility];  Service: Gynecology;  Laterality: N/A;   PLANTAR FASCIA SURGERY Bilateral    ROBOTIC ASSISTED SALPINGO OOPHERECTOMY Right 06/16/2015   Procedure: ROBOTIC ASSISTED SALPINGO OOPHORECTOMY, EXCISION OF RIGHT CUL DE SAC MASS;  Surgeon: Olivia Mackie, MD;  Location: [facility];  Service: Gynecology;  Laterality: Right;   TONSILLECTOMY     UPPER GI ENDOSCOPY     Patient Active Problem List   Diagnosis Date Noted   Morbid obesity  with BMI of 40.0-44.9, adult (HCC) 07/10/2021   ADD (attention deficit disorder) 07/10/2021   Type 2 diabetes mellitus with obesity (HCC) 07/10/2021   History of thyroiditis 07/10/2021   Fibromyalgia 07/10/2021   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/03/2019   B12 deficiency 04/10/2018   Columnar epithelial-lined lower esophagus    Postprocedural disorder of digestive system    Esophageal dysphagia    Persistent mood (affective) disorder, unspecified (HCC) 01/15/2018   Insomnia related to another mental disorder 01/15/2018   OCD (obsessive compulsive disorder) 10/29/2017   GAD (generalized anxiety disorder) 10/29/2017   Nephrolithiasis 10/22/2017   Atherosclerosis of aorta (HCC) 12/21/2016   Hiatal hernia 08/17/2016   Diverticulosis of colon 08/17/2016   Lumbosacral pain 01/07/2015   Intertrigo 11/05/2014   Perennial allergic rhinitis 09/03/2014   Lung nodules 09/03/2014   Vitamin D deficiency 09/03/2014   History of kidney stones 09/03/2014   History of iron deficiency anemia 09/03/2014   Mild persistent asthma without complication 11/21/2012   GERD without esophagitis 11/21/2012   History of hyperlipidemia 11/21/2012   Migraine with aura and without status migrainosus 11/21/2012   History of sleep apnea 11/21/2012   IBS (irritable bowel syndrome) 08/15/2012    PCP: Dr. Alba Cory   REFERRING PROVIDER: Danelle Berry PA-C   REFERRING DIAG: [phone] (ICD-10-CM) - Chronic bilateral low back pain with right-sided sciatica  Rationale for Evaluation and Treatment: Rehabilitation  THERAPY DIAG:  Other low back pain - Plan: PT plan of care cert/re-cert  Difficulty in walking, not elsewhere classified - Plan: PT plan of care cert/re-cert  ONSET DATE: 2016   SUBJECTIVE:  SUBJECTIVE STATEMENT: See pertinent history   PERTINENT HISTORY:  PMH history includes fibromyalgia. Pt reports that she started to experience low back pain back in 2016 and that her back pain has worsened over the past several months. She works as a Engineer, structural for elderly patients. She has since been to neurosurgeon who has since had her take X-rays and the physician wants her to be evaluated for what he thinks is also hip bursitis. She currently works for 5 hours and after her shift she needs to take a break because of the pain. It is limiting her from standing up to cook, getting clothes in and out of laundry, and picking clothes up from floor.  She has been to chiropractor for treatment for years which did not help with correcting her spine.   PAIN:  Are you having pain? Yes: NPRS scale: 8/10 Pain location: L3-S1 Central spinous process  Pain description: Achy and sharp  Aggravating factors: Coming from sitting to stand  Relieving factors: Sitting still  PRECAUTIONS: None  RED FLAGS: None   WEIGHT BEARING RESTRICTIONS: No  FALLS:  Has patient fallen in last 6 months? Yes. Number of falls 1; pt has only had one mechanical fall when going downstairs when she rolled her ankle. Otherwise, she does not feel unsteadiness   LIVING ENVIRONMENT: Lives with: lives with their spouse Lives in: House/apartment Stairs: Yes: External: 6 steps; on right going up and on left going up Has following equipment at home: None  OCCUPATION: Nurse   PLOF: Independent  PATIENT GOALS: To feel less pain when performing ADLs and job related duties as caregiver.   NEXT MD VISIT: Nothing scheduled   OBJECTIVE:   DIAGNOSTIC FINDINGS:  Per pt's myChart on phone: facet hypertrophy at L4-L4, L5-S1   PATIENT SURVEYS:  FOTO 34/100 with target 50  SCREENING FOR RED FLAGS: Bowel or bladder incontinence: No Spinal  tumors: No Cauda equina syndrome: No Compression fracture: No Abdominal aneurysm: No  COGNITION: Overall cognitive status: Within functional limits for tasks assessed     SENSATION: WFL  MUSCLE LENGTH: Hamstrings: Right 70 deg; Left 70 deg Thomas Test: NT Ely's Test: Negative    POSTURE: rounded shoulders  PALPATION: L3-L5 spinous process TTP and hypomobile   LUMBAR ROM:   AROM eval  Flexion 75%*  Extension 50%*  Right lateral flexion 100%  Left lateral flexion 100%  Right rotation 100%  Left rotation 100%   (Blank rows = not tested)  LOWER EXTREMITY ROM:     Active  Right eval Left eval  Hip flexion    Hip extension    Hip abduction    Hip adduction    Hip internal rotation    Hip external rotation    Knee flexion    Knee extension    Ankle dorsiflexion    Ankle plantarflexion    Ankle inversion    Ankle eversion     (Blank rows = not tested)  LOWER EXTREMITY MMT:    MMT Right eval Left eval  Hip flexion 4-* 4  Hip extension 3-* 4-  Hip abduction 3+* 4  Hip adduction    Hip internal rotation    Hip external rotation    Knee flexion    Knee extension 4 4+  Ankle dorsiflexion 4+ 4+  Ankle plantarflexion    Ankle inversion    Ankle eversion     (Blank rows = not tested)  LUMBAR SPECIAL TESTS:  Straight leg raise test: Positive, FABER test: Positive,  and FADIR Positive and Hip Ext Derotation negative   FUNCTIONAL TESTS:  None perfomed   GAIT: Distance walked: 50 ft  Assistive device utilized: None Level of assistance: Complete Independence Comments: No gait deficits noted   TODAY'S TREATMENT:                                                                                                                              DATE:   10/04/22  Mini-Squats 1 x 5    PATIENT EDUCATION:  Education details: form and technique for correct performance of exercises Person educated: Patient Education method: Explanation, Demonstration, Verbal cues,  and Handouts Education comprehension: verbalized understanding, returned demonstration, and verbal cues required  HOME EXERCISE PROGRAM: Access Code: [email or web address] URL: [email or web address] Date: 10/04/2022 Prepared by: Ellin Goodie  Exercises - Mini Squat with Counter Support  - 3-4 x weekly - 3 sets - 5 reps  ASSESSMENT:  CLINICAL IMPRESSION: Patient is a 52 y.o. white female who was seen today for physical therapy evaluation and treatment for chronic low back pain . She shows signs and symptoms that indicate she is most appropriate for the symptom modulation treatment based classification group with high disability and high pain. She also shows deficits with decreased lumbar ROM, hip strength, and decreased mobility along with increased pain with movement. Pt shows clear preference for flexion based exercises with lumbar extension worsening pain likely in setting of lumbar stenosis. Unable to fully rule in right hip bursitis, because of pt was experiencing pain provocation throughout most testing. She will continue to benefit from skilled PT to address the aforementioned deficits for improved activity tolerance with walking, lifting, and standing tasks required for cleaning and job related tasks.     OBJECTIVE IMPAIRMENTS: decreased balance, decreased mobility, difficulty walking, decreased ROM, decreased strength, hypomobility, impaired flexibility, obesity, and pain.   ACTIVITY LIMITATIONS: carrying, lifting, bending, standing, squatting, sleeping, stairs, bathing, toileting, dressing, hygiene/grooming, and caring for others  PARTICIPATION LIMITATIONS: cleaning, laundry, shopping, community activity, and occupation  PERSONAL FACTORS: Fitness, Past/current experiences, Time since onset of injury/illness/exacerbation, and 3+ comorbidities: Fibromyalgia, DM, and CAD  are also affecting patient's functional outcome.   REHAB POTENTIAL: Fair chronicity of low back pain   CLINICAL  DECISION MAKING: Stable/uncomplicated  EVALUATION COMPLEXITY: Low   GOALS: Goals reviewed with patient? No  SHORT TERM GOALS: Target date: 10/18/2022                      1. Pt will be independent with HEP in order to improve strength and balance in order to decrease fall  risk and improve function at home and work.                                  Baseline: NT  Goal status: INITIAL  2.  Patient will demonstrate understanding of how to use TENS unit for  pain modulation purposes while performing exercises. Baseline: NT  Goal status: INITIAL    LONG TERM GOALS: Target date: 12/13/2022  Patient will have improved function and activity level as evidenced by an increase in FOTO score by 10 points or more.  Baseline: 34/100 with target 50  Goal status: INITIAL  2.  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  Baseline: 8/10 NRPS   Goal status: INITIAL  3.  Patient will be able to bend down to floor and pickup weighted box of <=10 lbs as evidence of improved lumbar function while performing ADLs.   Baseline: Unable to perform  Goal status: INITIAL   PLAN:  PT FREQUENCY: 1-2x/week  PT DURATION: 10 weeks  PLANNED INTERVENTIONS: Therapeutic exercises, Therapeutic activity, Neuromuscular re-education, Balance training, Gait training, Patient/Family education, Self Care, Joint mobilization, Joint manipulation, Stair training, DME instructions, Aquatic Therapy, Dry Needling, Electrical stimulation, Spinal manipulation, Spinal mobilization, Cryotherapy, Moist heat, Traction, Ultrasound, Manual therapy, and Re-evaluation.  PLAN FOR NEXT SESSION: Gait analysis, TENS for pain modulation with activity, and continue to progress flexion based strengthening exercises  Ellin Goodie PT, DPT  [facility] [address] [place], [address] Phone: [phone]   Fax:  [phone]

## 2022-10-05 ENCOUNTER — Encounter: Payer: Self-pay | Admitting: Neurosurgery

## 2022-10-09 ENCOUNTER — Ambulatory Visit: Payer: PRIVATE HEALTH INSURANCE | Admitting: Physical Therapy

## 2022-10-10 ENCOUNTER — Encounter: Payer: Self-pay | Admitting: Cardiology

## 2022-10-10 ENCOUNTER — Ambulatory Visit: Payer: PRIVATE HEALTH INSURANCE | Attending: Cardiology | Admitting: Cardiology

## 2022-10-10 VITALS — BP 132/78 | HR 64 | Ht 61.0 in | Wt 220.2 lb

## 2022-10-10 DIAGNOSIS — I2510 Atherosclerotic heart disease of native coronary artery without angina pectoris: Secondary | ICD-10-CM | POA: Diagnosis not present

## 2022-10-10 DIAGNOSIS — R072 Precordial pain: Secondary | ICD-10-CM

## 2022-10-10 MED ORDER — METOPROLOL TARTRATE 50 MG PO TABS: 50.0000 mg | ORAL_TABLET | Freq: Once | ORAL | 0 refills | Status: DC

## 2022-10-10 NOTE — Patient Instructions (Signed)
Medication Instructions:   Your physician recommends that you continue on your current medications as directed. Please refer to the Current Medication list given to you today.  *If you need a refill on your cardiac medications before your next appointment, please call your pharmacy*   Lab Work:  None Ordered  If you have labs (blood work) drawn today and your tests are completely normal, you will receive your results only by: MyChart Message (if you have MyChart) OR A paper copy in the mail If you have any lab test that is abnormal or we need to change your treatment, we will call you to review the results.   Testing/Procedures:  Your physician has requested that you have an echocardiogram. Echocardiography is a painless test that uses sound waves to create images of your heart. It provides your doctor with information about the size and shape of your heart and how well your heart's chambers and valves are working. This procedure takes approximately one hour. There are no restrictions for this procedure. Please do NOT wear cologne, perfume, aftershave, or lotions (deodorant is allowed). Please arrive 15 minutes prior to your appointment time.    Your cardiac CT will be scheduled at one of the below locations:   [facility] [address] [place], [place] [address] [phone]   Please follow these instructions carefully (unless otherwise directed):  An IV will be required for this test and Nitroglycerin will be given.  Hold all erectile dysfunction medications at least 3 days (72 hrs) prior to test. (Ie viagra, cialis, sildenafil, tadalafil, etc)   On the Night Before the Test: Be sure to Drink plenty of water. Do not consume any caffeinated/decaffeinated beverages or chocolate 12 hours prior to your test. Do not take any antihistamines 12 hours prior to your test.  On the Day of the Test: Drink plenty of water until 1 hour prior to the test. Do  not eat any food 1 hour prior to test. You may take your regular medications prior to the test.  Take metoprolol (Lopressor) two hours prior to test. FEMALES- please wear underwire-free bra if available, avoid dresses & tight clothing      After the Test: Drink plenty of water. After receiving IV contrast, you may experience a mild flushed feeling. This is normal. On occasion, you may experience a mild rash up to 24 hours after the test. This is not dangerous. If this occurs, you can take Benadryl 25 mg and increase your fluid intake. If you experience trouble breathing, this can be serious. If it is severe call 911 IMMEDIATELY. If it is mild, please call our office. If you take any of these medications: Glipizide/Metformin, Avandament, Glucavance, please do not take 48 hours after completing test unless otherwise instructed.  We will call to schedule your test 2-4 weeks out understanding that some insurance companies will need an authorization prior to the service being performed.   For more information and frequently asked questions, please visit our website : [email or web address]  For non-scheduling related questions, please contact the cardiac imaging nurse navigator should you have any questions/concerns: Cardiac Imaging Nurse Navigators Direct Office Dial: [phone]   For scheduling needs, including cancellations and rescheduling, please call [place], [phone].   Follow-Up: At [facility], you and your health needs are our priority.  As part of our continuing mission to provide you with exceptional heart care, we have created designated Provider Care Teams.  These Care Teams include your primary  Cardiologist (physician) and Advanced Practice Providers (APPs -  Physician Assistants and Nurse Practitioners) who all work together to provide you with the care you need, when you need it.  We recommend signing up for the patient portal called "MyChart".  Sign  up information is provided on this After Visit Summary.  MyChart is used to connect with patients for Virtual Visits (Telemedicine).  Patients are able to view lab/test results, encounter notes, upcoming appointments, etc.  Non-urgent messages can be sent to your provider as well.   To learn more about what you can do with MyChart, go to [email or web address].    Your next appointment:    After Cardiac Testing   Provider:   You may see Debbe Odea, MD or one of the following Advanced Practice Providers on your designated Care Team:   Nicolasa Ducking, NP Eula Listen, PA-C Cadence Fransico Michael, PA-C Charlsie Quest, NP

## 2022-10-10 NOTE — Progress Notes (Signed)
Cardiology Office Note:    Date:  10/10/2022   ID:  Claire [name], DOB 1970-07-29, MRN [id number]  PCP:  Alba Cory, MD  [facility] Cardiologist:  Debbe Odea, MD  [facility] Electrophysiologist:  None   Referring MD: Alba Cory, MD   Chief Complaint  Patient presents with   Acute Visit    Patient called the office on 09/25/22 concerning an episode of dizziness with sharp chest pain and difficulty breathing that last 3 minutes.  Now having intermittent chest tightness with weakness.  Reports feeling well today.  Ortho BP obtained, slight dizziness up sitting.    History of Present Illness:    Claire [name] is a 52 y.o. female with a hx of CAD (non obstructive 25%LAD), diabetes, depression, hyperlipidemia, family history of CAD who presents due to chest pain.  Patient states having right-sided chest pain after helping with a clients pull up.  Chest pain radiated to her right arm and scapular area.  Also has some discomfort in her neck lasting 3 minutes.  Since then, she has noticed occasional soreness/pressure on her right side.  Has a family history of MI in her mother.  Prior notes  echo 07/2019 showed normal systolic and diastolic function, EF 60 to 65%.  Coronary CTA 08/2019 calcium score 175, mild LAD disease 25%.   Past Medical History:  Diagnosis Date   Anemia    Anxiety    Arthritis    hands, hips   Asthma    Bilateral leg cramps    Depression    Diabetes mellitus without complication (HCC)    history no longer a problem since weight loss surgery   DJD (degenerative joint disease)    Dyspnea on exertion    a. 07/2019 Echo: EF 60-65%, no rwma, nl RV fxn, triv MR.   GERD (gastroesophageal reflux disease)    Headache    Migraines   IBS (irritable bowel syndrome)    Kidney stone    Low serum vitamin B12    Lung nodules    Migraine    down to approx 4x/mo since starting meds   Muscle spasm    Nonobstructive CAD (coronary artery  disease)    a. 06/2014 ETT: Ex time 8:59, max HR 155, 10.1 METS, no ST/T changes; b. 08/2019 Cor CTA: Ca2+ 175 (99th %'ile), LAD 35-49p, LCX 0-86m-->Med rx.   Psoriasis    vaginal area   Seasonal allergies    Sleep apnea    history no longer a problem since weight loss surgery   Vasovagal syncope    [place], Dr. Ples Specter    Vitamin D deficiency     Past Surgical History:  Procedure Laterality Date   BLADDER SURGERY  2010   BREAST BIOPSY Right 2014   stereotatic biopsy   CHOLECYSTECTOMY  2010   COLONOSCOPY  2014    Done at [facility]   COLONOSCOPY WITH PROPOFOL N/A 01/28/2018   Procedure: COLONOSCOPY WITH PROPOFOL;  Surgeon: Pasty Spillers, MD;  Location: [facility];  Service: Endoscopy;  Laterality: N/A;   DILITATION & CURRETTAGE/HYSTROSCOPY WITH NOVASURE ABLATION N/A 06/16/2015   Procedure: DILATATION & CURETTAGE/HYSTEROSCOPY WITH NOVASURE ABLATION;  Surgeon: Olivia Mackie, MD;  Location: [facility];  Service: Gynecology;  Laterality: N/A;   ESOPHAGOGASTRODUODENOSCOPY (EGD) WITH PROPOFOL N/A 01/28/2018   Procedure: ESOPHAGOGASTRODUODENOSCOPY (EGD) WITH BIOPSIES;  Surgeon: Pasty Spillers, MD;  Location: [facility];  Service: Endoscopy;  Laterality: N/A;   GASTRIC ROUX-EN-Y N/A 11/03/2019   Procedure:  LAPAROSCOPIC 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 WITH UPPER ENDOSCOPY;  Surgeon: Luretha Murphy, MD;  Location: [facility];  Service: General;  Laterality: N/A;   HIATAL HERNIA REPAIR N/A 11/03/2019   Procedure: HERNIA REPAIR HIATAL;  Surgeon: Luretha Murphy, MD;  Location: [facility];  Service: General;  Laterality: N/A;   LAPAROSCOPIC GASTRIC SLEEVE RESECTION  2012   LAPAROSCOPY N/A 06/16/2015   Procedure: LAPAROSCOPY DIAGNOSTIC;  Surgeon: Olivia Mackie, MD;  Location: [facility];  Service: Gynecology;  Laterality: N/A;   LAPAROSCOPY N/A 04/01/2018   Procedure: LAPAROSCOPY DIAGNOSTIC ERAS PATHWAY ENTEROLYSIS, CECOPEXY;  Surgeon: Luretha Murphy,  MD;  Location: [facility];  Service: General;  Laterality: N/A;   LITHOTRIPSY  12/11/2017   laser lithotripsy   LYSIS OF ADHESION N/A 06/16/2015   Procedure: LYSIS OF ADHESION;  Surgeon: Olivia Mackie, MD;  Location: [facility];  Service: Gynecology;  Laterality: N/A;   PLANTAR FASCIA SURGERY Bilateral    ROBOTIC ASSISTED SALPINGO OOPHERECTOMY Right 06/16/2015   Procedure: ROBOTIC ASSISTED SALPINGO OOPHORECTOMY, EXCISION OF RIGHT CUL DE SAC MASS;  Surgeon: Olivia Mackie, MD;  Location: [facility];  Service: Gynecology;  Laterality: Right;   TONSILLECTOMY     UPPER GI ENDOSCOPY      Current Medications: Current Meds  Medication Sig   Accu-Chek FastClix Lancets MISC USE TO TEST UP TO 4 TIMES A DAY   ACCU-CHEK GUIDE test strip    albuterol (VENTOLIN HFA) 108 (90 Base) MCG/ACT inhaler TAKE 2 PUFFS EVERY 6 HOURS AS NEEDED FOR WHEEZING OR SHORTNESS OF BREATH. (VENTOLIN NOT COVERED)   aspirin EC 81 MG tablet Take 1 tablet (81 mg total) by mouth daily. Swallow whole.   atorvastatin (LIPITOR) 40 MG tablet Take 1 tablet (40 mg total) by mouth daily.   budesonide-formoterol (SYMBICORT) 160-4.5 MCG/ACT inhaler INHALE 2 PUFFS INTO THE LUNGS TWICE A DAY   calcium citrate-vitamin D (CALCIUM CITRATE CHEWY BITE) 500-500 MG-UNIT chewable tablet Chew 1 tablet by mouth 3 (three) times daily.   clobetasol cream (TEMOVATE) 0.05 % SMARTSIG:Sparingly Topical Twice Daily   clobetasol ointment (TEMOVATE) 0.05 % Apply 1 application topically daily as needed (psoriasis).    cyanocobalamin (VITAMIN B12) 1000 MCG/ML injection Inject 1 mL (1,000 mcg total) into the muscle every 14 (fourteen) days.   cyclobenzaprine (FLEXERIL) 10 MG tablet Take 10 mg by mouth at bedtime as needed for muscle spasms.   fluconazole (DIFLUCAN) 150 MG tablet TAKE 1 TABLET (150 MG) BY MOUTH EVERY OTHER DAY   fluocinonide (LIDEX) 0.05 % external solution Apply 1 application topically 2 (two) times daily as needed (psoriasis).    IRON-VITAMIN C PO Take 1  tablet by mouth daily.   ketoconazole (NIZORAL) 2 % shampoo Apply 1 application topically 2 (two) times daily as needed (psoriasis).    Lactobacillus (ACIDOPHILUS PO) Take 1 capsule by mouth daily. Probiotic plus Calcium   metoprolol tartrate (LOPRESSOR) 50 MG tablet Take 1 tablet (50 mg total) by mouth once for 1 dose. TWO HOURS PRIOR TO CARDIAC CTA   modafinil (PROVIGIL) 100 MG tablet Take 1 tablet (100 mg total) by mouth daily.   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    mometasone (NASONEX) 50 MCG/ACT nasal spray PLACE 2 SPRAYS INTO THE NOSE DAILY.   montelukast (SINGULAIR) 10 MG tablet Take 1 tablet (10 mg total) by mouth at bedtime. TAKE 1 TABLET(10 MG) BY MOUTH AT BEDTIME   Multiple Vitamins-Minerals (MULTIVITAMIN ADULT) CHEW Chew  1 tablet by mouth daily.   ondansetron (ZOFRAN) 4 MG tablet Take 4 mg by mouth every 8 (eight) hours as needed.   pantoprazole (PROTONIX) 40 MG tablet Take 40 mg by mouth daily.   pregabalin (LYRICA) 300 MG capsule Take 1 capsule (300 mg total) by mouth 2 (two) times daily.   Semaglutide, 1 MG/DOSE, (OZEMPIC, 1 MG/DOSE,) 4 MG/3ML SOPN Inject 1 mg into the skin once a week.   SYRINGE-NEEDLE, DISP, 3 ML (B-D 3CC LUER-LOK SYR 25GX1") 25G X 1" 3 ML MISC Inject 1 each into the skin daily at 12 noon.   traZODone (DESYREL) 100 MG tablet Take 2 tablets (200 mg total) by mouth at bedtime.   triamcinolone cream (KENALOG) 0.1 % Apply 1 application topically 2 (two) times daily.   TRINTELLIX 20 MG TABS tablet Take 1 tablet (20 mg total) by mouth daily.   Vitamin D, Ergocalciferol, (DRISDOL) 1.25 MG (50000 UNIT) CAPS capsule Take 1 capsule (50,000 Units total) by mouth every Saturday.   zonisamide (ZONEGRAN) 50 MG capsule Take 150 mg by mouth daily.      Allergies:   Aspartame and phenylalanine, Gabapentin, Glucose, Linzess [linaclotide], Other, Triamcinolone, Cymbalta [duloxetine hcl], Tape, and Tapentadol    Social History   Socioeconomic History   Marital status: Married    Spouse name: Weston Brass    Number of children: 3   Years of education: Not on file   Highest education level: Some college, no degree  Occupational History   Occupation: unemployed   Tobacco Use   Smoking status: Former    Current packs/day: 0.00    Average packs/day: 1 pack/day for 10.0 years (10.0 ttl pk-yrs)    Types: Cigarettes    Start date: 02/20/1983    Quit date: 02/19/1993    Years since quitting: 29.6   Smokeless tobacco: Never  Vaping Use   Vaping status: Never Used  Substance and Sexual Activity   Alcohol use: Yes    Alcohol/week: 0.0 standard drinks of alcohol    Comment: occasionally   Drug use: No   Sexual activity: Yes    Partners: Male    Comment: last sex 04 Jan 2018  Other Topics Concern   Not on file  Social History Narrative   Not working since September 2021 - 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, 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    Social Determinants of Health   Financial Resource Strain: Medium Risk (07/06/2022)   Overall Financial Resource Strain (CARDIA)    Difficulty of Paying Living Expenses: Somewhat hard  Food Insecurity: Food Insecurity Present (07/06/2022)   Hunger Vital Sign    Worried About Running Out of Food in the Last Year: Sometimes true    Ran Out of Food in the Last Year: Sometimes true  Transportation Needs: No Transportation Needs (07/06/2022)   PRAPARE - Administrator, Civil Service (Medical): No    Lack of Transportation (Non-Medical): No  Physical Activity: Insufficiently Active (07/06/2022)   Exercise Vital Sign    Days of Exercise per Week: 2 days    Minutes of Exercise per Session: 20 min  Stress: Stress Concern Present (07/06/2022)   [employer] of Occupational Health - Occupational Stress Questionnaire    Feeling of Stress : To some extent  Social Connections: Moderately Integrated (06/11/2018)   Social  Connection and Isolation Panel [NHANES]    Frequency of Communication with Friends and Family: More than three times a week  Frequency of Social Gatherings with Friends and Family: Twice a week    Attends Religious Services: Never    Database administrator or Organizations: Yes    Attends Engineer, structural: 1 to 4 times per year    Marital Status: Married     Family History: The patient's family history includes Breast cancer in her paternal aunt and paternal grandmother; Heart attack in her maternal uncle, mother, paternal grandfather, and sister; Hyperlipidemia in her father; Lung cancer in her maternal grandfather; Multiple myeloma in her mother; Stroke in her mother.  ROS:   Please see the history of present illness.     All other systems reviewed and are negative.  EKGs/Labs/Other Studies Reviewed:    The following studies were reviewed today:   EKG Interpretation Date/Time:  Wednesday October 10 2022 09:57:46 EDT Ventricular Rate:  64 PR Interval:  146 QRS Duration:  88 QT Interval:  436 QTC Calculation: 449 R Axis:   6  Text Interpretation: Normal sinus rhythm Normal ECG Confirmed by Debbe Odea ([id number]) on 10/10/2022 10:16:11 AM    Recent Labs: 01/08/2022: Hemoglobin 15.1; Platelets 256 07/09/2022: ALT 65; BUN 8; Creat 0.58; Potassium 4.3; Sodium 139  Recent Lipid Panel    Component Value Date/Time   CHOL 190 01/08/2022 1227   CHOL 179 12/31/2018 0857   TRIG 143 01/08/2022 1227   HDL 64 01/08/2022 1227   HDL 64 12/31/2018 0857   CHOLHDL 3.0 01/08/2022 1227   VLDL 20 11/11/2015 0846   LDLCALC 102 (H) 01/08/2022 1227    Physical Exam:    VS:  BP 132/78 (BP Location: Left Arm, Patient Position: Sitting, Cuff Size: Large)   Pulse 64   Ht 5\' 1"  (1.549 m)   Wt 220 lb 3.2 oz (99.9 kg)   LMP 09/24/2022   SpO2 98%   BMI 41.61 kg/m     Wt Readings from Last 3 Encounters:  10/10/22 220 lb 3.2 oz (99.9 kg)  10/01/22 217 lb 12.8 oz (98.8 kg)   09/14/22 217 lb 4.8 oz (98.6 kg)     GEN:  Well nourished, well developed in no acute distress HEENT: Normal NECK: No JVD; No carotid bruits CARDIAC: RRR, no murmurs, rubs, gallops RESPIRATORY:  Clear to auscultation without rales, wheezing or rhonchi  ABDOMEN: Soft, non-tender, non-distended MUSCULOSKELETAL:  No edema; right chest wall tenderness with palpation. SKIN: Warm and dry NEUROLOGIC:  Alert and oriented x 3 PSYCHIATRIC:  Normal affect   ASSESSMENT:    1. Precordial pain   2. Coronary artery disease involving native coronary artery of native heart without angina pectoris    PLAN:    In order of problems listed above:  Chest pain, appears musculoskeletal, right chest wall tenderness on palpation. history of mild LAD disease previously evaluated 3 years ago.  Repeat coronary CTA to rule out any significant CAD. Mild nonobstructive CAD history.  Chest pain as above.  Continue aspirin 81 mg, Lipitor 40.  Coronary CT as above.  Repeat echo.  Titrate Lipitor at follow-up visit.  Follow-up after cardiac testing.  Medication Adjustments/Labs and Tests Ordered: Current medicines are reviewed at length with the patient today.  Concerns regarding medicines are outlined above.  Orders Placed This Encounter  Procedures   CT CORONARY MORPH W/CTA COR W/SCORE W/CA W/CM &/OR WO/CM   EKG 12-Lead   ECHOCARDIOGRAM COMPLETE   Meds ordered this encounter  Medications   metoprolol tartrate (LOPRESSOR) 50 MG tablet    Sig: Take 1  tablet (50 mg total) by mouth once for 1 dose. TWO HOURS PRIOR TO CARDIAC CTA    Dispense:  1 tablet    Refill:  0    Patient Instructions  Medication Instructions:   Your physician recommends that you continue on your current medications as directed. Please refer to the Current Medication list given to you today.  *If you need a refill on your cardiac medications before your next appointment, please call your pharmacy*   Lab Work:  None  Ordered  If you have labs (blood work) drawn today and your tests are completely normal, you will receive your results only by: MyChart Message (if you have MyChart) OR A paper copy in the mail If you have any lab test that is abnormal or we need to change your treatment, we will call you to review the results.   Testing/Procedures:  Your physician has requested that you have an echocardiogram. Echocardiography is a painless test that uses sound waves to create images of your heart. It provides your doctor with information about the size and shape of your heart and how well your heart's chambers and valves are working. This procedure takes approximately one hour. There are no restrictions for this procedure. Please do NOT wear cologne, perfume, aftershave, or lotions (deodorant is allowed). Please arrive 15 minutes prior to your appointment time.    Your cardiac CT will be scheduled at one of the below locations:   [facility] [address] [place], [place] [address] [phone]   Please follow these instructions carefully (unless otherwise directed):  An IV will be required for this test and Nitroglycerin will be given.  Hold all erectile dysfunction medications at least 3 days (72 hrs) prior to test. (Ie viagra, cialis, sildenafil, tadalafil, etc)   On the Night Before the Test: Be sure to Drink plenty of water. Do not consume any caffeinated/decaffeinated beverages or chocolate 12 hours prior to your test. Do not take any antihistamines 12 hours prior to your test.  On the Day of the Test: Drink plenty of water until 1 hour prior to the test. Do not eat any food 1 hour prior to test. You may take your regular medications prior to the test.  Take metoprolol (Lopressor) two hours prior to test. FEMALES- please wear underwire-free bra if available, avoid dresses & tight clothing      After the Test: Drink plenty of water. After receiving IV  contrast, you may experience a mild flushed feeling. This is normal. On occasion, you may experience a mild rash up to 24 hours after the test. This is not dangerous. If this occurs, you can take Benadryl 25 mg and increase your fluid intake. If you experience trouble breathing, this can be serious. If it is severe call 911 IMMEDIATELY. If it is mild, please call our office. If you take any of these medications: Glipizide/Metformin, Avandament, Glucavance, please do not take 48 hours after completing test unless otherwise instructed.  We will call to schedule your test 2-4 weeks out understanding that some insurance companies will need an authorization prior to the service being performed.   For more information and frequently asked questions, please visit our website : [email or web address]  For non-scheduling related questions, please contact the cardiac imaging nurse navigator should you have any questions/concerns: Cardiac Imaging Nurse Navigators Direct Office Dial: [phone]   For scheduling needs, including cancellations and rescheduling, please call [place], [phone].   Follow-Up: At [facility],  you and your health needs are our priority.  As part of our continuing mission to provide you with exceptional heart care, we have created designated Provider Care Teams.  These Care Teams include your primary Cardiologist (physician) and Advanced Practice Providers (APPs -  Physician Assistants and Nurse Practitioners) who all work together to provide you with the care you need, when you need it.  We recommend signing up for the patient portal called "MyChart".  Sign up information is provided on this After Visit Summary.  MyChart is used to connect with patients for Virtual Visits (Telemedicine).  Patients are able to view lab/test results, encounter notes, upcoming appointments, etc.  Non-urgent messages can be sent to your provider as well.   To learn more about  what you can do with MyChart, go to [email or web address].    Your next appointment:    After Cardiac Testing   Provider:   You may see Debbe Odea, MD or one of the following Advanced Practice Providers on your designated Care Team:   Nicolasa Ducking, NP Eula Listen, PA-C Cadence Fransico Michael, PA-C Charlsie Quest, NP    Signed, Debbe Odea, MD  10/10/2022 10:29 AM    [facility]

## 2022-10-11 ENCOUNTER — Telehealth: Payer: Self-pay | Admitting: Physical Therapy

## 2022-10-11 ENCOUNTER — Ambulatory Visit: Payer: PRIVATE HEALTH INSURANCE | Admitting: Physical Therapy

## 2022-10-11 NOTE — Telephone Encounter (Signed)
Pt was not aware of today's apt and she said that this was not listed on her original schedule. PT provided upcoming apt time for this Monday, August 26th at 6:15. Pt is aware of this time and she will call if she cannot make it.

## 2022-10-15 ENCOUNTER — Ambulatory Visit: Payer: PRIVATE HEALTH INSURANCE | Admitting: Physical Therapy

## 2022-10-16 ENCOUNTER — Encounter (HOSPITAL_COMMUNITY): Payer: Self-pay

## 2022-10-16 ENCOUNTER — Ambulatory Visit: Payer: PRIVATE HEALTH INSURANCE | Admitting: Physical Therapy

## 2022-10-16 NOTE — Progress Notes (Deleted)
Name: Claire [name]   MRN: [id number]    DOB: Jun 27, 1970   Date:10/16/2022       Progress Note  Subjective  Chief Complaint  Follow up  HPI  Major Depression: she was seeing Dr. Maryruth Bun who is also getting therapy.  She is off Zolot and is currently on Trintelix 20 mg and off Lunesta and taking Trazodone for sleep.  She had side effects with Ambien ( interrupted sleep and sleep walking) ,  or Temazepam ( groggy ).  ,Dr. Maryruth Bun was given Adderall but she was drinking alcohol and it was placed on hold, she cannot afford going back to Dr. Maryruth Bun. She is getting rx from me, phq 9 is better, she is now working as an Arboriculturist . We gave her provigil but she is not sure if she is taking it at this time .  Asthma Mild Intermittentt: doing well at this timeshe takes symbicort prn    Migraine: she is still seeing Dr. Neale Burly - she is on  Zonegran and also gets triggers point injections , prn  Symptoms are stable, on average 1 migraine episodes per month, she has tension headaches intermittent 3-4 , symptoms are stable.    DMII: diagnosed at age 89, took medication for a while, but stopped all medications 10/25/2010 after bariatric surgery.  Maximum weight of 265 lbs and was down to 118.3 lbs - lowest weight back in 08/2017), she was gaining weight since she started to feel better after umbilical hernia repair with  lysis of adhesions and cecopexy back in 04/01/2018, she had a bariatric surgery revision 11/03/2019 by Dr. Daphine Deutscher, at that time weight was 196 lb, and it went down to 184 lbs and is now gradually gaining it back, , stable since last visit around 215 lbs. She is back on Ozempic since A1C A1C today is 6.2 %. She has been drinking beer daily and V8 juice, explained that it contains a lot of calories and also alcohol misuse is dangerous  Elevated LFT's: she did not return for labs, she states she resumed drinking beer with B8 daily, 3-4 beers per day and more on weekends. Husband has asked her to  cut down on amount. Discussed diagnosis of alcoholism, increase risk of cirrhosis for her since she already has metabolic syndrome and NASH is also a reason for liver cirrhosis. Must cut down on intake and consider quitting   IBS/GERD history of bypass surgery:  She was doing well after  lysis of adhesions and cecopexy back in 04/01/2018. She has intermittent esophageal pain - odynophagia and resolves with regurgitation. 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 No side effects LDL goal is below 100 and it was 102. We will recheck LFT's before adjusting medication dose   B12 deficiency: she has been getting B12 injections bi-monthly .    Thyroiditis: she is under the care of Dr. Gershon Crane Unchanged    Dizziness/syncope: seen at [facility], and has seen Dr. Christie Beckers 03/2019 , they ruled out POTS but may have had vasovagal syncope. She is doing better, seeing cardiologist now Currently seeing Dr. Sherryll Burger at [facility], first EEG negative - it was ordered due to jerky movements, but had a second study done in August and results are pending, MRI brain early 2023 was normal. Unchanged   History of COVID: diagnosed 04/21 and again 05/2021   she still has mental  fogginess, and feel tired , she also has decrease in exercise tolerance. She was referred to neuropsychiatrist by neurologist, still waiting for appointment. 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/03/2019   B12 deficiency 04/10/2018   Columnar epithelial-lined lower esophagus    Postprocedural disorder of digestive system    Esophageal dysphagia    Persistent mood (affective) disorder, unspecified (HCC) 01/15/2018   Insomnia related to another mental disorder 01/15/2018   OCD (obsessive compulsive disorder) 10/29/2017   GAD (generalized anxiety disorder) 10/29/2017   Nephrolithiasis 10/22/2017   Atherosclerosis of aorta (HCC) 12/21/2016   Hiatal hernia 08/17/2016   Diverticulosis of colon 08/17/2016   Lumbosacral pain 01/07/2015   Intertrigo 11/05/2014   Perennial allergic rhinitis 09/03/2014   Lung nodules 09/03/2014   Vitamin D deficiency 09/03/2014   History of kidney stones 09/03/2014   History of iron deficiency anemia 09/03/2014   Mild persistent asthma without complication 11/21/2012   GERD without esophagitis 11/21/2012   History of hyperlipidemia 11/21/2012   Migraine with aura and without status migrainosus 11/21/2012   History of sleep apnea 11/21/2012   IBS (irritable bowel syndrome) 08/15/2012    Past Surgical History:  Procedure Laterality Date   BLADDER SURGERY  2010   BREAST BIOPSY Right 2014   stereotatic biopsy   CHOLECYSTECTOMY  2010   COLONOSCOPY  2014    Done at [facility]   COLONOSCOPY WITH PROPOFOL N/A 01/28/2018   Procedure: COLONOSCOPY WITH PROPOFOL;  Surgeon: Pasty Spillers, MD;  Location: [facility];  Service: Endoscopy;  Laterality: N/A;   DILITATION & CURRETTAGE/HYSTROSCOPY WITH NOVASURE ABLATION N/A 06/16/2015   Procedure: DILATATION & CURETTAGE/HYSTEROSCOPY WITH NOVASURE ABLATION;  Surgeon: Olivia Mackie, MD;  Location: [facility];  Service: Gynecology;  Laterality: N/A;   ESOPHAGOGASTRODUODENOSCOPY (EGD) WITH PROPOFOL N/A 01/28/2018   Procedure: ESOPHAGOGASTRODUODENOSCOPY (EGD) WITH BIOPSIES;  Surgeon:  Pasty Spillers, MD;  Location: [facility];  Service: Endoscopy;  Laterality: N/A;   GASTRIC ROUX-EN-Y N/A 11/03/2019   Procedure: LAPAROSCOPIC 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 WITH UPPER ENDOSCOPY;  Surgeon: Luretha Murphy, MD;  Location: [facility];  Service: General;  Laterality: N/A;   HIATAL HERNIA REPAIR N/A 11/03/2019   Procedure: HERNIA REPAIR HIATAL;  Surgeon: Luretha Murphy, MD;  Location: [facility];  Service: General;  Laterality: N/A;   LAPAROSCOPIC GASTRIC SLEEVE RESECTION  2012   LAPAROSCOPY N/A 06/16/2015   Procedure: LAPAROSCOPY DIAGNOSTIC;  Surgeon: Olivia Mackie, MD;  Location: [facility];  Service: Gynecology;  Laterality: N/A;   LAPAROSCOPY N/A 04/01/2018   Procedure: LAPAROSCOPY DIAGNOSTIC ERAS PATHWAY ENTEROLYSIS, CECOPEXY;  Surgeon: Luretha Murphy, MD;  Location: [facility];  Service: General;  Laterality: N/A;   LITHOTRIPSY  12/11/2017   laser lithotripsy   LYSIS OF ADHESION N/A 06/16/2015   Procedure: LYSIS OF ADHESION;  Surgeon: Olivia Mackie, MD;  Location: [facility];  Service: Gynecology;  Laterality: N/A;   PLANTAR FASCIA SURGERY Bilateral    ROBOTIC ASSISTED SALPINGO OOPHERECTOMY Right 06/16/2015   Procedure: ROBOTIC ASSISTED SALPINGO OOPHORECTOMY, EXCISION OF RIGHT CUL DE SAC MASS;  Surgeon: Olivia Mackie, MD;  Location: [facility];  Service: Gynecology;  Laterality: Right;   TONSILLECTOMY     UPPER GI ENDOSCOPY      Family History  Problem Relation Age of Onset   Heart attack Mother    Stroke Mother    Multiple myeloma Mother    Hyperlipidemia Father    Heart attack Sister    Heart attack Maternal Uncle    Heart attack Paternal Grandfather    Breast cancer Paternal Aunt    Breast cancer Paternal Grandmother    Lung cancer Maternal Grandfather     Social History   Tobacco Use   Smoking status: Former    Current packs/day: 0.00    Average packs/day: 1 pack/day for 10.0 years (10.0 ttl pk-yrs)    Types: Cigarettes     Start date: 02/20/1983    Quit date: 02/19/1993    Years since quitting: 29.6   Smokeless tobacco: Never  Substance Use Topics   Alcohol use: Yes    Alcohol/week: 0.0 standard drinks of alcohol    Comment: occasionally     Current Outpatient Medications:    Accu-Chek FastClix Lancets MISC, USE TO TEST UP TO 4 TIMES A DAY, Disp: , Rfl:    ACCU-CHEK GUIDE test strip, , Disp: , Rfl:    albuterol (VENTOLIN HFA) 108 (90 Base) MCG/ACT inhaler, TAKE 2 PUFFS EVERY 6 HOURS AS NEEDED FOR WHEEZING OR SHORTNESS OF BREATH. (VENTOLIN NOT COVERED), Disp: 8.5 each, Rfl: 2   aspirin EC 81 MG tablet, Take 1 tablet (81 mg total) by mouth daily. Swallow whole., Disp: 90 tablet, Rfl: 3   atorvastatin (LIPITOR) 40 MG tablet, Take 1 tablet (40 mg total) by mouth daily., Disp: 30 tablet, Rfl: 11   budesonide-formoterol (SYMBICORT) 160-4.5 MCG/ACT inhaler, INHALE 2 PUFFS INTO THE LUNGS TWICE A DAY, Disp: 10.2 each, Rfl: 2   calcium citrate-vitamin D (CALCIUM CITRATE CHEWY BITE) 500-500 MG-UNIT chewable tablet, Chew 1 tablet by mouth 3 (three) times daily., Disp: , Rfl:    clobetasol cream (TEMOVATE) 0.05 %, SMARTSIG:Sparingly Topical Twice Daily, Disp: , Rfl:    clobetasol ointment (TEMOVATE) 0.05 %, Apply 1 application topically daily as needed (psoriasis). , Disp: , Rfl: 0   cyanocobalamin (VITAMIN B12) 1000 MCG/ML injection, Inject 1 mL (1,000 mcg total) into the muscle every 14 (fourteen) days., Disp: 6 mL, Rfl: 1   cyclobenzaprine (FLEXERIL) 10 MG tablet, Take 10 mg by mouth at bedtime as needed for muscle spasms., Disp: , Rfl:    fluconazole (DIFLUCAN) 150 MG tablet, TAKE 1 TABLET (150 MG) BY MOUTH EVERY OTHER DAY, Disp: 3 tablet, Rfl: 0   fluocinonide (LIDEX) 0.05 % external solution, Apply 1 application topically 2 (two) times daily as needed (psoriasis). , Disp: , Rfl:    IRON-VITAMIN C PO, Take 1 tablet by mouth daily., Disp: , Rfl:    ketoconazole (NIZORAL) 2 % shampoo, Apply 1 application topically 2 (two)  times daily as needed (psoriasis). , Disp: , Rfl:    Lactobacillus (ACIDOPHILUS PO), Take 1 capsule by mouth daily. Probiotic plus Calcium, Disp: , Rfl:    metoprolol tartrate (LOPRESSOR) 50 MG tablet, Take 1 tablet (50 mg total) by mouth once for 1 dose. TWO HOURS PRIOR TO CARDIAC CTA, Disp: 1 tablet, Rfl: 0   modafinil (  PROVIGIL) 100 MG tablet, Take 1 tablet (100 mg total) by mouth daily., Disp: 90 tablet, Rfl: 0   mometasone (ELOCON) 0.1 % lotion, Apply 1 application topically daily as needed (psoriasis in the ears). , Disp: , Rfl: 4   mometasone (NASONEX) 50 MCG/ACT nasal spray, PLACE 2 SPRAYS INTO THE NOSE DAILY., Disp: 51 each, Rfl: 1   montelukast (SINGULAIR) 10 MG tablet, Take 1 tablet (10 mg total) by mouth at bedtime. TAKE 1 TABLET(10 MG) BY MOUTH AT BEDTIME, Disp: 90 tablet, Rfl: 3   Multiple Vitamins-Minerals (MULTIVITAMIN ADULT) CHEW, Chew 1 tablet by mouth daily., Disp: , Rfl:    ondansetron (ZOFRAN) 4 MG tablet, Take 4 mg by mouth every 8 (eight) hours as needed., Disp: , Rfl:    pantoprazole (PROTONIX) 40 MG tablet, Take 40 mg by mouth daily., Disp: , Rfl:    pregabalin (LYRICA) 300 MG capsule, Take 1 capsule (300 mg total) by mouth 2 (two) times daily., Disp: 180 capsule, Rfl: 1   Semaglutide, 1 MG/DOSE, (OZEMPIC, 1 MG/DOSE,) 4 MG/3ML SOPN, Inject 1 mg into the skin once a week., Disp: 6 mL, Rfl: 1   SYRINGE-NEEDLE, DISP, 3 ML (B-D 3CC LUER-LOK SYR 25GX1") 25G X 1" 3 ML MISC, Inject 1 each into the skin daily at 12 noon., Disp: 1 each, Rfl: 5   traZODone (DESYREL) 100 MG tablet, Take 2 tablets (200 mg total) by mouth at bedtime., Disp: 180 tablet, Rfl: 1   triamcinolone cream (KENALOG) 0.1 %, Apply 1 application topically 2 (two) times daily., Disp: 453.6 g, Rfl: 0   TRINTELLIX 20 MG TABS tablet, Take 1 tablet (20 mg total) by mouth daily., Disp: 90 tablet, Rfl: 1   Vitamin D, Ergocalciferol, (DRISDOL) 1.25 MG (50000 UNIT) CAPS capsule, Take 1 capsule (50,000 Units total) by mouth  every Saturday., Disp: 12 capsule, Rfl: 1   zonisamide (ZONEGRAN) 50 MG capsule, Take 150 mg by mouth daily. , Disp: , Rfl: 2  Allergies  Allergen Reactions   Aspartame And Phenylalanine Nausea Only and Other (See Comments)    GI upset    Gabapentin     Bladder incontinence at higher dose   Glucose Nausea Only and Nausea And Vomiting   Linzess [Linaclotide]     Worsening of diarrhea   Other Hives and Other (See Comments)    Ricotta Cheese: flushed and hot   Triamcinolone Other (See Comments)   Cymbalta [Duloxetine Hcl] Palpitations    And photosensitivity   Tape Rash    Some clear tapes   Tapentadol Rash    Some clear tapes    I personally reviewed {Reviewed:[id number]} with the patient/caregiver today.   ROS  ***  Objective  There were no vitals filed for this visit.  There is no height or weight on file to calculate BMI.  Physical Exam ***  Recent Results (from the past 2160 hour(s))  HM MAMMOGRAPHY     Status: None   Collection Time: 09/28/22 12:00 AM  Result Value Ref Range   HM Mammogram 0-4 Bi-Rad 0-4 Bi-Rad, Self Reported Normal    Comment: [facility]    Diabetic Foot Exam: Diabetic Foot Exam - Simple   No data filed    ***  PHQ2/9:    09/14/2022    2:57 PM 07/09/2022    8:40 AM 02/07/2022    8:10 AM 01/08/2022   11:38 AM 12/15/2021   11:26 AM  Depression screen PHQ 2/9  Decreased Interest 0 0 0 0 0  Down, Depressed, Hopeless 0 0 0 0 0  PHQ - 2 Score 0 0 0 0 0  Altered sleeping 0 0 0 0 0  Tired, decreased energy 0 0 0 0 0  Change in appetite 0 0 0 0 0  Feeling bad or failure about yourself  0 0 0 0 0  Trouble concentrating 0 0 0 0 0  Moving slowly or fidgety/restless 0 0 0 0 0  Suicidal thoughts 0 0 0 0 0  PHQ-9 Score 0 0 0 0 0  Difficult doing work/chores Not difficult at all  Not difficult at all  Not difficult at all    phq 9 is {gen pos [id number]} ***  Fall Risk:    09/14/2022    2:57 PM 07/09/2022    8:40 AM 02/07/2022     8:09 AM 01/08/2022   11:38 AM 12/15/2021   11:26 AM  Fall Risk   Falls in the past year? 0 0 0 0 0  Number falls in past yr: 0  0 0 0  Injury with Fall? 0  0 0 0  Risk for fall due to :  No Fall Risks No Fall Risks No Fall Risks   Follow up  Falls prevention discussed Education provided;Falls evaluation completed;Falls prevention discussed Falls prevention discussed    ***   Functional Status Survey:   ***   Assessment & Plan  *** There are no diagnoses linked to this encounter.

## 2022-10-17 ENCOUNTER — Ambulatory Visit: Payer: PRIVATE HEALTH INSURANCE

## 2022-10-17 ENCOUNTER — Ambulatory Visit: Payer: PRIVATE HEALTH INSURANCE | Admitting: Family Medicine

## 2022-10-17 DIAGNOSIS — Z1211 Encounter for screening for malignant neoplasm of colon: Secondary | ICD-10-CM

## 2022-10-17 DIAGNOSIS — E1169 Type 2 diabetes mellitus with other specified complication: Secondary | ICD-10-CM

## 2022-11-05 ENCOUNTER — Encounter: Payer: Self-pay | Admitting: Physical Therapy

## 2022-11-05 ENCOUNTER — Ambulatory Visit: Payer: PRIVATE HEALTH INSURANCE | Attending: Family Medicine | Admitting: Physical Therapy

## 2022-11-05 DIAGNOSIS — R262 Difficulty in walking, not elsewhere classified: Secondary | ICD-10-CM | POA: Diagnosis present

## 2022-11-05 DIAGNOSIS — M5459 Other low back pain: Secondary | ICD-10-CM | POA: Diagnosis present

## 2022-11-05 NOTE — Therapy (Signed)
OUTPATIENT PHYSICAL THERAPY THORACOLUMBAR TREATMENT    Patient Name: Claire [name] MRN: [id number] DOB:[birth year], 52 y.o., female Today's Date: 11/05/2022  END OF SESSION:  PT End of Session - 11/05/22 1437     Visit Number 2    Number of Visits 20    Date for PT Re-Evaluation 12/13/22    Authorization Type [facility] [employer]    Authorization - Visit Number 2    Authorization - Number of Visits 20    Progress Note Due on Visit 10    PT Start Time 1435    PT Stop Time 1515    PT Time Calculation (min) 40 min    Activity Tolerance Patient limited by pain    Behavior During Therapy Restless;Agitated             Past Medical History:  Diagnosis Date   Anemia    Anxiety    Arthritis    hands, hips   Asthma    Bilateral leg cramps    Depression    Diabetes mellitus without complication (HCC)    history no longer a problem since weight loss surgery   DJD (degenerative joint disease)    Dyspnea on exertion    a. 07/2019 Echo: EF 60-65%, no rwma, nl RV fxn, triv MR.   GERD (gastroesophageal reflux disease)    Headache    Migraines   IBS (irritable bowel syndrome)    Kidney stone    Low serum vitamin B12    Lung nodules    Migraine    down to approx 4x/mo since starting meds   Muscle spasm    Nonobstructive CAD (coronary artery disease)    a. 06/2014 ETT: Ex time 8:59, max HR 155, 10.1 METS, no ST/T changes; b. 08/2019 Cor CTA: Ca2+ 175 (99th %'ile), LAD 35-49p, LCX 0-38m-->Med rx.   Psoriasis    vaginal area   Seasonal allergies    Sleep apnea    history no longer a problem since weight loss surgery   Vasovagal syncope    [place], Dr. Ples Specter    Vitamin D deficiency    Past Surgical History:  Procedure Laterality Date   BLADDER SURGERY  2010   BREAST BIOPSY Right 2014   stereotatic biopsy   CHOLECYSTECTOMY  2010   COLONOSCOPY  2014    [facility] at [facility]   COLONOSCOPY WITH PROPOFOL N/A 01/28/2018   Procedure: COLONOSCOPY WITH PROPOFOL;  Surgeon: Pasty Spillers, MD;  Location: [facility];  Service: Endoscopy;  Laterality: N/A;   DILITATION & CURRETTAGE/HYSTROSCOPY WITH NOVASURE ABLATION N/A 06/16/2015   Procedure: DILATATION & CURETTAGE/HYSTEROSCOPY WITH NOVASURE ABLATION;  Surgeon: Olivia Mackie, MD;  Location: [facility];  Service: Gynecology;  Laterality: N/A;   ESOPHAGOGASTRODUODENOSCOPY (EGD) WITH PROPOFOL N/A 01/28/2018   Procedure: ESOPHAGOGASTRODUODENOSCOPY (EGD) WITH BIOPSIES;  Surgeon: Pasty Spillers, MD;  Location: [facility];  Service: Endoscopy;  Laterality: N/A;   GASTRIC ROUX-EN-Y N/A 11/03/2019   Procedure: LAPAROSCOPIC 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 WITH UPPER ENDOSCOPY;  Surgeon: Luretha Murphy, MD;  Location: [facility];  Service: General;  Laterality: N/A;   HIATAL HERNIA REPAIR N/A 11/03/2019   Procedure: HERNIA REPAIR HIATAL;  Surgeon: Luretha Murphy, MD;  Location: [facility];  Service: General;  Laterality: N/A;   LAPAROSCOPIC GASTRIC SLEEVE RESECTION  2012   LAPAROSCOPY N/A 06/16/2015   Procedure: LAPAROSCOPY DIAGNOSTIC;  Surgeon: Olivia Mackie, MD;  Location: [facility];  Service: Gynecology;  Laterality: N/A;   LAPAROSCOPY  N/A 04/01/2018   Procedure: LAPAROSCOPY DIAGNOSTIC ERAS PATHWAY ENTEROLYSIS, CECOPEXY;  Surgeon: Luretha Murphy, MD;  Location: [facility];  Service: General;  Laterality: N/A;   LITHOTRIPSY  12/11/2017   laser lithotripsy   LYSIS OF ADHESION N/A 06/16/2015   Procedure: LYSIS OF ADHESION;  Surgeon: Olivia Mackie, MD;  Location: [facility];  Service: Gynecology;  Laterality: N/A;   PLANTAR FASCIA SURGERY Bilateral    ROBOTIC ASSISTED SALPINGO OOPHERECTOMY Right 06/16/2015   Procedure: ROBOTIC ASSISTED SALPINGO OOPHORECTOMY, EXCISION OF RIGHT CUL DE SAC MASS;  Surgeon: Olivia Mackie, MD;  Location: [facility];  Service: Gynecology;  Laterality: Right;   TONSILLECTOMY     UPPER GI ENDOSCOPY     Patient Active Problem List   Diagnosis Date Noted   Morbid obesity  with BMI of 40.0-44.9, adult (HCC) 07/10/2021   ADD (attention deficit disorder) 07/10/2021   Type 2 diabetes mellitus with obesity (HCC) 07/10/2021   History of thyroiditis 07/10/2021   Fibromyalgia 07/10/2021   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/03/2019   B12 deficiency 04/10/2018   Columnar epithelial-lined lower esophagus    Postprocedural disorder of digestive system    Esophageal dysphagia    Persistent mood (affective) disorder, unspecified (HCC) 01/15/2018   Insomnia related to another mental disorder 01/15/2018   OCD (obsessive compulsive disorder) 10/29/2017   GAD (generalized anxiety disorder) 10/29/2017   Nephrolithiasis 10/22/2017   Atherosclerosis of aorta (HCC) 12/21/2016   Hiatal hernia 08/17/2016   Diverticulosis of colon 08/17/2016   Lumbosacral pain 01/07/2015   Intertrigo 11/05/2014   Perennial allergic rhinitis 09/03/2014   Lung nodules 09/03/2014   Vitamin D deficiency 09/03/2014   History of kidney stones 09/03/2014   History of iron deficiency anemia 09/03/2014   Mild persistent asthma without complication 11/21/2012   GERD without esophagitis 11/21/2012   History of hyperlipidemia 11/21/2012   Migraine with aura and without status migrainosus 11/21/2012   History of sleep apnea 11/21/2012   IBS (irritable bowel syndrome) 08/15/2012    PCP: Dr. Alba Cory   REFERRING PROVIDER: Danelle Berry PA-C   REFERRING DIAG: [phone] (ICD-10-CM) - Chronic bilateral low back pain with right-sided sciatica  Rationale for Evaluation and Treatment: Rehabilitation  THERAPY DIAG:  Other low back pain  Difficulty in walking, not elsewhere classified  ONSET DATE: 2016   SUBJECTIVE:  SUBJECTIVE STATEMENT: Pt reports having to cancel several of here recent appointments because of a death in her family and her cardiac issues. She states that she continues to feel increased low back pain when lifting patients even with having good form.   PERTINENT HISTORY:  PMH history includes fibromyalgia. Pt reports that she started to experience low back pain back in 2016 and that her back pain has worsened over the past several months. She works as a Engineer, structural for elderly patients. She has since been to neurosurgeon who has since had her take X-rays and the physician wants her to be evaluated for what he thinks is also hip bursitis. She currently works for 5 hours and after her shift she needs to take a break because of the pain. It is limiting her from standing up to cook, getting clothes in and out of laundry, and picking clothes up from floor.  She has been to chiropractor for treatment for years which did not help with correcting her spine.   PAIN:  Are you having pain? Yes: NPRS scale: 7-8/10 Pain location: L3-S1 Central spinous process  Pain description: Achy and sharp  Aggravating factors: Coming from sitting to stand  Relieving factors: Sitting still  PRECAUTIONS: None  RED FLAGS: None   WEIGHT BEARING RESTRICTIONS: No  FALLS:  Has patient fallen in last 6 months? Yes. Number of falls 1; pt has only had one mechanical fall when going downstairs when she rolled her ankle. Otherwise, she does not feel unsteadiness   LIVING ENVIRONMENT: Lives with: lives with their spouse Lives in: House/apartment Stairs: Yes: External: 6 steps; on right going up and on left going up Has following equipment at home: None  OCCUPATION: Nurse   PLOF: Independent  PATIENT GOALS: To feel less pain when performing ADLs and job related duties as caregiver.   NEXT MD VISIT: Nothing scheduled   OBJECTIVE:   DIAGNOSTIC FINDINGS:  Per pt's myChart on phone: facet hypertrophy at  L4-L4, L5-S1   PATIENT SURVEYS:  FOTO 34/100 with target 50  SCREENING FOR RED FLAGS: Bowel or bladder incontinence: No Spinal tumors: No Cauda equina syndrome: No Compression fracture: No Abdominal aneurysm: No  COGNITION: Overall cognitive status: Within functional limits for tasks assessed     SENSATION: WFL  MUSCLE LENGTH: Hamstrings: Right 70 deg; Left 70 deg Thomas Test: NT Ely's Test: Negative    POSTURE: rounded shoulders  PALPATION: L3-L5 spinous process TTP and hypomobile   LUMBAR ROM:   AROM eval  Flexion 75%*  Extension 50%*  Right lateral flexion 100%  Left lateral flexion 100%  Right rotation 100%  Left rotation 100%   (Blank rows = not tested)  LOWER EXTREMITY ROM:     Active  Right eval Left eval  Hip flexion    Hip extension    Hip abduction    Hip adduction    Hip internal rotation    Hip external rotation    Knee flexion    Knee extension    Ankle dorsiflexion    Ankle plantarflexion    Ankle inversion    Ankle eversion     (Blank rows = not tested)  LOWER EXTREMITY MMT:    MMT Right eval Left eval  Hip flexion 4-* 4  Hip extension 3-* 4-  Hip abduction 3+* 4  Hip adduction    Hip internal rotation    Hip external rotation    Knee flexion    Knee extension 4 4+  Ankle dorsiflexion  4+ 4+  Ankle plantarflexion    Ankle inversion    Ankle eversion     (Blank rows = not tested)  LUMBAR SPECIAL TESTS:  Straight leg raise test: Positive, FABER test: Positive, and FADIR Positive and Hip Ext Derotation negative   FUNCTIONAL TESTS:  None perfomed   GAIT: Distance walked: 50 ft  Assistive device utilized: None Level of assistance: Complete Independence Comments: No gait deficits noted   TODAY'S TREATMENT:                                                                                                                              DATE:   11/05/22: Nu-step seat and arms at 6 for 5 min and no resistance  Use of TENS  set to 3 on both channels for added pain modulation: Mini-Squats with BUE support  3 x 10  -Pt reports NRPS of 2/10  Seated Hip Adduction with 3 sec hold 3 x 10  Seated Hip Abduction with Yellow Band 1 x 10  Posterior Pelvic Tilt with 3 sec hold 3 x 10   10/04/22  Mini-Squats 1 x 5    PATIENT EDUCATION:  Education details: form and technique for correct performance of exercises Person educated: Patient Education method: Explanation, Demonstration, Verbal cues, and Handouts Education comprehension: verbalized understanding, returned demonstration, and verbal cues required  HOME EXERCISE PROGRAM: Access Code: [email or web address] URL: [email or web address] Date: 11/05/2022 Prepared by: Ellin Goodie  Exercises - Mini Squat with Counter Support  - 3-4 x weekly - 3 sets - 5 reps - Seated Hip Abduction with Resistance  - 3-4 x weekly - 3 sets - 10 reps - Seated Hip Adduction Isometrics with Ball  - 3-4 x weekly - 3 sets - 10 reps - Seated Posterior Pelvic Tilt  - 3-4 x weekly - 3 sets - 10 reps - 2-3sec  hold ASSESSMENT:  CLINICAL IMPRESSION: Pt returning after a month hiatus. TENS unit successful at modulating pain with pt able to perform all seated hip and abdominal strengthening exercises without an increase in her low back pain. She will continue to benefit from skilled PT to address the aforementioned deficits for improved activity tolerance with walking, lifting, and standing tasks required for cleaning and job related tasks.     OBJECTIVE IMPAIRMENTS: decreased balance, decreased mobility, difficulty walking, decreased ROM, decreased strength, hypomobility, impaired flexibility, obesity, and pain.   ACTIVITY LIMITATIONS: carrying, lifting, bending, standing, squatting, sleeping, stairs, bathing, toileting, dressing, hygiene/grooming, and caring for others  PARTICIPATION LIMITATIONS: cleaning, laundry, shopping, community activity, and occupation  PERSONAL FACTORS: Fitness,  Past/current experiences, Time since onset of injury/illness/exacerbation, and 3+ comorbidities: Fibromyalgia, DM, and CAD  are also affecting patient's functional outcome.   REHAB POTENTIAL: Fair chronicity of low back pain   CLINICAL DECISION MAKING: Stable/uncomplicated  EVALUATION COMPLEXITY: Low   GOALS: Goals reviewed with patient? No  SHORT TERM GOALS: Target date: 10/18/2022  1. Pt will be independent with HEP in order to improve strength and balance in order to decrease fall  risk and improve function at home and work.                                  Baseline: NT  11/05/22: Performing exercises independently  Goal status: ACHIEVED   2.  Patient will demonstrate understanding of how to use TENS unit for pain modulation purposes while performing exercises. Baseline: NT  Goal status: ONGOING     LONG TERM GOALS: Target date: 12/13/2022  Patient will have improved function and activity level as evidenced by an increase in FOTO score by 10 points or more.  Baseline: 34/100 with target 50  Goal status: ONGOING   2.  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  Baseline: 8/10 NRPS   Goal status: ONGOING   3.  Patient will be able to bend down to floor and pickup weighted box of <=10 lbs as evidence of improved lumbar function while performing ADLs.   Baseline: Unable to perform  Goal status: ONGOING    PLAN:  PT FREQUENCY: 1-2x/week  PT DURATION: 10 weeks  PLANNED INTERVENTIONS: Therapeutic exercises, Therapeutic activity, Neuromuscular re-education, Balance training, Gait training, Patient/Family education, Self Care, Joint mobilization, Joint manipulation, Stair training, DME instructions, Aquatic Therapy, Dry Needling, Electrical stimulation, Spinal manipulation, Spinal mobilization, Cryotherapy, Moist  heat, Traction, Ultrasound, Manual therapy, and Re-evaluation.  PLAN FOR NEXT SESSION: Progress flexion based exercises: seated hip marches or SLR    Ellin Goodie PT, DPT  [facility] [address] [place], [address] Phone: [phone]   Fax:  [phone]

## 2022-11-06 ENCOUNTER — Ambulatory Visit: Payer: PRIVATE HEALTH INSURANCE | Attending: Cardiology

## 2022-11-06 DIAGNOSIS — R072 Precordial pain: Secondary | ICD-10-CM | POA: Diagnosis not present

## 2022-11-06 DIAGNOSIS — I2510 Atherosclerotic heart disease of native coronary artery without angina pectoris: Secondary | ICD-10-CM

## 2022-11-06 LAB — ECHOCARDIOGRAM COMPLETE: Area-P 1/2: 3.72 cm2

## 2022-11-07 ENCOUNTER — Ambulatory Visit: Payer: PRIVATE HEALTH INSURANCE | Admitting: Physical Therapy

## 2022-11-07 ENCOUNTER — Telehealth: Payer: PRIVATE HEALTH INSURANCE | Admitting: Physician Assistant

## 2022-11-07 DIAGNOSIS — J019 Acute sinusitis, unspecified: Secondary | ICD-10-CM | POA: Diagnosis not present

## 2022-11-07 DIAGNOSIS — B9689 Other specified bacterial agents as the cause of diseases classified elsewhere: Secondary | ICD-10-CM | POA: Diagnosis not present

## 2022-11-07 MED ORDER — AMOXICILLIN-POT CLAVULANATE 875-125 MG PO TABS
1.0000 | ORAL_TABLET | Freq: Two times a day (BID) | ORAL | 0 refills | Status: DC
Start: 2022-11-07 — End: 2022-12-03

## 2022-11-07 MED ORDER — FLUCONAZOLE 150 MG PO TABS: ORAL_TABLET | ORAL | 0 refills | Status: DC

## 2022-11-07 NOTE — Progress Notes (Signed)
E-Visit for Sinus Problems  We are sorry that you are not feeling well.  Here is how we plan to help!  Based on what you have shared with me it looks like you have sinusitis.  Sinusitis is inflammation and infection in the sinus cavities of the head.  Based on your presentation I believe you most likely have Acute Bacterial Sinusitis.  This is an infection caused by bacteria and is treated with antibiotics. I have prescribed Augmentin 875mg /125mg  one tablet twice daily with food, for 7 days. You may use an oral decongestant such as Mucinex D or if you have glaucoma or high blood pressure use plain Mucinex. Saline nasal spray help and can safely be used as often as needed for congestion.  If you develop worsening sinus pain, fever or notice severe headache and vision changes, or if symptoms are not better after completion of antibiotic, please schedule an appointment with a health care provider.    I have sent in fluconazole in case of yeast.  Sinus infections are not as easily transmitted as other respiratory infection, however we still recommend that you avoid close contact with loved ones, especially the very young and elderly.  Remember to wash your hands thoroughly throughout the day as this is the number one way to prevent the spread of infection!  Home Care: Only take medications as instructed by your medical team. Complete the entire course of an antibiotic. Do not take these medications with alcohol. A steam or ultrasonic humidifier can help congestion.  You can place a towel over your head and breathe in the steam from hot water coming from a faucet. Avoid close contacts especially the very young and the elderly. Cover your mouth when you cough or sneeze. Always remember to wash your hands.  Get Help Right Away If: You develop worsening fever or sinus pain. You develop a severe head ache or visual changes. Your symptoms persist after you have completed your treatment plan.  Make sure  you Understand these instructions. Will watch your condition. Will get help right away if you are not doing well or get worse.  Thank you for choosing an e-visit.  Your e-visit answers were reviewed by a board certified advanced clinical practitioner to complete your personal care plan. Depending upon the condition, your plan could have included both over the counter or prescription medications.  Please review your pharmacy choice. Make sure the pharmacy is open so you can pick up prescription now. If there is a problem, you may contact your provider through [employer] and have the prescription routed to another pharmacy.  Your safety is important to [place]. If you have drug allergies check your prescription carefully.   For the next 24 hours you can use MyChart to ask questions about today's visit, request a non-urgent call back, or ask for a work or school excuse. You will get an email in the next two days asking about your experience. I hope that your e-visit has been valuable and will speed your recovery.

## 2022-11-07 NOTE — Progress Notes (Signed)
I have spent 5 minutes in review of e-visit questionnaire, review and updating patient chart, medical decision making and response to patient.   Mia Milan Cody Jacklynn Dehaas, PA-C    

## 2022-11-14 ENCOUNTER — Encounter: Payer: Self-pay | Admitting: Physical Therapy

## 2022-11-14 ENCOUNTER — Ambulatory Visit
Admission: RE | Admit: 2022-11-14 | Discharge: 2022-11-14 | Disposition: A | Discharge status: Home / Self Care | Payer: PRIVATE HEALTH INSURANCE | Source: Ambulatory Visit | Attending: Cardiology | Admitting: Cardiology

## 2022-11-14 ENCOUNTER — Ambulatory Visit: Payer: PRIVATE HEALTH INSURANCE | Admitting: Physical Therapy

## 2022-11-14 DIAGNOSIS — R072 Precordial pain: Secondary | ICD-10-CM | POA: Insufficient documentation

## 2022-11-14 DIAGNOSIS — I2510 Atherosclerotic heart disease of native coronary artery without angina pectoris: Secondary | ICD-10-CM | POA: Diagnosis not present

## 2022-11-14 LAB — POCT I-STAT CREATININE: Creatinine, Ser: 0.7 mg/dL (ref 0.44–1.00)

## 2022-11-14 MED ORDER — NITROGLYCERIN 0.4 MG SL SUBL
0.8000 mg | SUBLINGUAL_TABLET | Freq: Once | SUBLINGUAL | Status: AC
  Administered 2022-11-14: 0.8 mg via SUBLINGUAL
  Filled 2022-11-14: qty 25

## 2022-11-14 MED ORDER — IOHEXOL 350 MG/ML SOLN
80.0000 mL | Freq: Once | INTRAVENOUS | Status: AC | PRN
  Administered 2022-11-14: 80 mL via INTRAVENOUS

## 2022-11-14 NOTE — Therapy (Signed)
Encounter started by mistake.

## 2022-11-14 NOTE — Progress Notes (Signed)
Pt tolerated procedure well with no issues. Pt ABCs intact. Pt denies any complaints. Pt encouraged to drink plenty of water throughout the day. Pt ambulatory with steady gait.

## 2022-11-20 ENCOUNTER — Ambulatory Visit: Payer: PRIVATE HEALTH INSURANCE | Admitting: Physical Therapy

## 2022-11-20 ENCOUNTER — Encounter: Payer: Self-pay | Admitting: Cardiology

## 2022-11-20 MED ORDER — ATORVASTATIN CALCIUM 40 MG PO TABS: 40.0000 mg | ORAL_TABLET | Freq: Every day | ORAL | 3 refills | Status: DC

## 2022-11-21 ENCOUNTER — Encounter: Payer: Self-pay | Admitting: Family Medicine

## 2022-11-22 ENCOUNTER — Ambulatory Visit: Payer: PRIVATE HEALTH INSURANCE | Attending: Family Medicine | Admitting: Physical Therapy

## 2022-11-22 ENCOUNTER — Encounter: Payer: Self-pay | Admitting: Physical Therapy

## 2022-11-22 DIAGNOSIS — R262 Difficulty in walking, not elsewhere classified: Secondary | ICD-10-CM | POA: Insufficient documentation

## 2022-11-22 DIAGNOSIS — M5459 Other low back pain: Secondary | ICD-10-CM | POA: Insufficient documentation

## 2022-11-22 NOTE — Therapy (Addendum)
OUTPATIENT PHYSICAL THERAPY THORACOLUMBAR DISCHARGE   Patient Name: Claire [name] MRN: [id number] DOB:Aug 06, 1970, 52 y.o., female Today's Date: 11/22/2022  END OF SESSION:  PT End of Session - 11/22/22 1650     Visit Number 3    Number of Visits 20    Date for PT Re-Evaluation 12/13/22    Authorization Type [facility] [employer]    Authorization - Visit Number 3    Authorization - Number of Visits 20    Progress Note Due on Visit 10    PT Start Time 1645    PT Stop Time 1730    PT Time Calculation (min) 45 min    Activity Tolerance Patient limited by pain    Behavior During Therapy Restless;Agitated             Past Medical History:  Diagnosis Date   Anemia    Anxiety    Arthritis    hands, hips   Asthma    Bilateral leg cramps    Depression    Diabetes mellitus without complication (HCC)    history no longer a problem since weight loss surgery   DJD (degenerative joint disease)    Dyspnea on exertion    a. 07/2019 Echo: EF 60-65%, no rwma, nl RV fxn, triv MR.   GERD (gastroesophageal reflux disease)    Headache    Migraines   IBS (irritable bowel syndrome)    Kidney stone    Low serum vitamin B12    Lung nodules    Migraine    down to approx 4x/mo since starting meds   Muscle spasm    Nonobstructive CAD (coronary artery disease)    a. 06/2014 ETT: Ex time 8:59, max HR 155, 10.1 METS, no ST/T changes; b. 08/2019 Cor CTA: Ca2+ 175 (99th %'ile), LAD 35-49p, LCX 0-2m-->Med rx.   Psoriasis    vaginal area   Seasonal allergies    Sleep apnea    history no longer a problem since weight loss surgery   Vasovagal syncope    [place], Dr. Ples Specter    Vitamin D deficiency    Past Surgical History:  Procedure Laterality Date   BLADDER SURGERY  2010   BREAST BIOPSY Right 2014   stereotatic biopsy   CHOLECYSTECTOMY  2010   COLONOSCOPY  2014    Done at [facility]   COLONOSCOPY WITH PROPOFOL N/A 01/28/2018   Procedure: COLONOSCOPY WITH PROPOFOL;  Surgeon: Pasty Spillers, MD;  Location: [facility];  Service: Endoscopy;  Laterality: N/A;   DILITATION & CURRETTAGE/HYSTROSCOPY WITH NOVASURE ABLATION N/A 06/16/2015   Procedure: DILATATION & CURETTAGE/HYSTEROSCOPY WITH NOVASURE ABLATION;  Surgeon: Olivia Mackie, MD;  Location: [facility];  Service: Gynecology;  Laterality: N/A;   ESOPHAGOGASTRODUODENOSCOPY (EGD) WITH PROPOFOL N/A 01/28/2018   Procedure: ESOPHAGOGASTRODUODENOSCOPY (EGD) WITH BIOPSIES;  Surgeon: Pasty Spillers, MD;  Location: [facility];  Service: Endoscopy;  Laterality: N/A;   GASTRIC ROUX-EN-Y N/A 11/03/2019   Procedure: LAPAROSCOPIC 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 WITH UPPER ENDOSCOPY;  Surgeon: Luretha Murphy, MD;  Location: [facility];  Service: General;  Laterality: N/A;   HIATAL HERNIA REPAIR N/A 11/03/2019   Procedure: HERNIA REPAIR HIATAL;  Surgeon: Luretha Murphy, MD;  Location: [facility];  Service: General;  Laterality: N/A;   LAPAROSCOPIC GASTRIC SLEEVE RESECTION  2012   LAPAROSCOPY N/A 06/16/2015   Procedure: LAPAROSCOPY DIAGNOSTIC;  Surgeon: Olivia Mackie, MD;  Location: [facility];  Service: Gynecology;  Laterality: N/A;   LAPAROSCOPY N/A  04/01/2018   Procedure: LAPAROSCOPY DIAGNOSTIC ERAS PATHWAY ENTEROLYSIS, CECOPEXY;  Surgeon: Luretha Murphy, MD;  Location: [facility];  Service: General;  Laterality: N/A;   LITHOTRIPSY  12/11/2017   laser lithotripsy   LYSIS OF ADHESION N/A 06/16/2015   Procedure: LYSIS OF ADHESION;  Surgeon: Olivia Mackie, MD;  Location: [facility];  Service: Gynecology;  Laterality: N/A;   PLANTAR FASCIA SURGERY Bilateral    ROBOTIC ASSISTED SALPINGO OOPHERECTOMY Right 06/16/2015   Procedure: ROBOTIC ASSISTED SALPINGO OOPHORECTOMY, EXCISION OF RIGHT CUL DE SAC MASS;  Surgeon: Olivia Mackie, MD;  Location: [facility];  Service: Gynecology;  Laterality: Right;   TONSILLECTOMY     UPPER GI ENDOSCOPY     Patient Active Problem List   Diagnosis Date Noted   Morbid obesity  with BMI of 40.0-44.9, adult (HCC) 07/10/2021   ADD (attention deficit disorder) 07/10/2021   Type 2 diabetes mellitus with obesity (HCC) 07/10/2021   History of thyroiditis 07/10/2021   Fibromyalgia 07/10/2021   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/03/2019   B12 deficiency 04/10/2018   Columnar epithelial-lined lower esophagus    Postprocedural disorder of digestive system    Esophageal dysphagia    Persistent mood (affective) disorder, unspecified (HCC) 01/15/2018   Insomnia related to another mental disorder 01/15/2018   OCD (obsessive compulsive disorder) 10/29/2017   GAD (generalized anxiety disorder) 10/29/2017   Nephrolithiasis 10/22/2017   Atherosclerosis of aorta (HCC) 12/21/2016   Hiatal hernia 08/17/2016   Diverticulosis of colon 08/17/2016   Lumbosacral pain 01/07/2015   Intertrigo 11/05/2014   Perennial allergic rhinitis 09/03/2014   Lung nodules 09/03/2014   Vitamin D deficiency 09/03/2014   History of kidney stones 09/03/2014   History of iron deficiency anemia 09/03/2014   Mild persistent asthma without complication 11/21/2012   GERD without esophagitis 11/21/2012   History of hyperlipidemia 11/21/2012   Migraine with aura and without status migrainosus 11/21/2012   History of sleep apnea 11/21/2012   IBS (irritable bowel syndrome) 08/15/2012    PCP: Dr. Alba Cory   REFERRING PROVIDER: Danelle Berry PA-C   REFERRING DIAG: [phone] (ICD-10-CM) - Chronic bilateral low back pain with right-sided sciatica  Rationale for Evaluation and Treatment: Rehabilitation  THERAPY DIAG:  Other low back pain  Difficulty in walking, not elsewhere classified  ONSET DATE: 2016   SUBJECTIVE:  SUBJECTIVE STATEMENT: Pt states that [place]. Pt reports feeling a significant increase in her pain after last session to point where she was limited for several days.   PERTINENT HISTORY:  PMH history includes fibromyalgia. Pt reports that she started to experience low back pain back in 2016 and that her back pain has worsened over the past several months. She works as a Engineer, structural for elderly patients. She has since been to neurosurgeon who has since had her take X-rays and the physician wants her to be evaluated for what he thinks is also hip bursitis. She currently works for 5 hours and after her shift she needs to take a break because of the pain. It is limiting her from standing up to cook, getting clothes in and out of laundry, and picking clothes up from floor.  She has been to chiropractor for treatment for years which did not help with correcting her spine.   PAIN:  Are you having pain? Yes: NPRS scale: 5-6/10 Pain location: L3-S1 Central spinous process  Pain description: Achy and sharp  Aggravating factors: Coming from sitting to stand  Relieving factors: Sitting still  PRECAUTIONS: None  RED FLAGS: None   WEIGHT BEARING RESTRICTIONS: No  FALLS:  Has patient fallen in last 6 months? Yes. Number of falls 1; pt has only had one mechanical fall when going downstairs when she rolled her ankle. Otherwise, she does not feel unsteadiness   LIVING ENVIRONMENT: Lives with: lives with their spouse Lives in: House/apartment Stairs: Yes: External: 6 steps; on right going up and on left going up Has following equipment at home: None  OCCUPATION: Nurse   PLOF: Independent  PATIENT GOALS: To feel less pain when performing ADLs and job related duties as caregiver.   NEXT MD VISIT: Nothing scheduled   OBJECTIVE:   DIAGNOSTIC FINDINGS:  Per pt's myChart on phone: facet hypertrophy at L4-L4, L5-S1   CLINICAL DATA:   evaluate mobility of grade 1 spondy at 4-5   EXAM: LUMBAR SPINE - COMPLETE 4+ VIEW   COMPARISON:  November 14, 2021   FINDINGS: There are five non-rib bearing lumbar-type vertebral bodies. There is grade 1 anterolisthesis of L4-5. It measures approximately 5 mm on neutral imaging, 5 mm with flexion and approximately 4 mm with extension. There is no evidence for acute fracture or subluxation. Mild intervertebral disc space height loss at L4-5 and L5-S1. Predominately lower lumbar facet arthropathy. Atherosclerotic calcifications.   IMPRESSION: Grade 1 anterolisthesis of L4-5, likely due to facet arthropathy. This minimally decreases with extension.     Electronically Signed   By: Meda Klinefelter M.D.   On: 10/07/2022 11:14  PATIENT SURVEYS:  FOTO 34/100 with target 50  SCREENING FOR RED FLAGS: Bowel or bladder incontinence: No Spinal tumors: No Cauda equina syndrome: No Compression fracture: No Abdominal aneurysm: No  COGNITION: Overall cognitive status: Within functional limits for tasks assessed     SENSATION: WFL  MUSCLE LENGTH: Hamstrings: Right 70 deg; Left 70 deg Thomas Test: NT Ely's Test: Negative    POSTURE: rounded shoulders  PALPATION: L3-L5 spinous process TTP and hypomobile   LUMBAR ROM:   AROM eval  Flexion 75%*  Extension 50%*  Right lateral flexion 100%  Left lateral flexion 100%  Right rotation 100%  Left rotation 100%   (Blank rows = not tested)  LOWER EXTREMITY ROM:     Active  Right eval Left eval  Hip flexion  Hip extension    Hip abduction    Hip adduction    Hip internal rotation    Hip external rotation    Knee flexion    Knee extension    Ankle dorsiflexion    Ankle plantarflexion    Ankle inversion    Ankle eversion     (Blank rows = not tested)  LOWER EXTREMITY MMT:    MMT Right eval Left eval  Hip flexion 4-* 4  Hip extension 3-* 4-  Hip abduction 3+* 4  Hip adduction    Hip internal rotation     Hip external rotation    Knee flexion    Knee extension 4 4+  Ankle dorsiflexion 4+ 4+  Ankle plantarflexion    Ankle inversion    Ankle eversion     (Blank rows = not tested)  LUMBAR SPECIAL TESTS:  Straight leg raise test: Positive, FABER test: Positive, and FADIR Positive and Hip Ext Derotation negative   FUNCTIONAL TESTS:  None perfomed   GAIT: Distance walked: 50 ft  Assistive device utilized: None Level of assistance: Complete Independence Comments: No gait deficits noted   TODAY'S TREATMENT:                                                                                                                              DATE:   11/22/22:  SLR 3 x 5  -Pt performed with bent knee on left  Supine Bridges 3 x 5  LTR 3 x 5  Lumbar Flexion AAROM ball rolls center, left, and right x 30  Seated Hip Abduction 3 x 5  Seated Hip Adduction Isometrics with ball squeeze 3 sec hold 1 x 10   11/05/22: Nu-step seat and arms at 6 for 5 min and no resistance  Use of TENS set to 3 on both channels for added pain modulation: Mini-Squats with BUE support  3 x 10  -Pt reports NRPS of 2/10  Seated Hip Adduction with 3 sec hold 3 x 10  Seated Hip Abduction with Yellow Band 1 x 10  Posterior Pelvic Tilt with 3 sec hold 3 x 10   10/04/22  Mini-Squats 1 x 5    PATIENT EDUCATION:  Education details: form and technique for correct performance of exercises Person educated: Patient Education method: Explanation, Demonstration, Verbal cues, and Handouts Education comprehension: verbalized understanding, returned demonstration, and verbal cues required  HOME EXERCISE PROGRAM: Access Code: [email or web address] URL: [email or web address] Date: 11/05/2022 Prepared by: Ellin Goodie  Exercises - Mini Squat with Counter Support  - 3-4 x weekly - 3 sets - 5 reps - Seated Hip Abduction with Resistance  - 3-4 x weekly - 3 sets - 10 reps - Seated Hip Adduction Isometrics with Ball  - 3-4 x  weekly - 3 sets - 10 reps - Seated Posterior Pelvic Tilt  - 3-4 x weekly - 3 sets - 10 reps - 2-3sec  hold ASSESSMENT:  CLINICAL IMPRESSION: Pt  continues to experience worsening low back pain despite ongoing regression to exercises. She would benefit from further medical eval and treatment. She has not met her rehab goals at this point and continues to have hip weakness and ongoing low back pain.   OBJECTIVE IMPAIRMENTS: decreased balance, decreased mobility, difficulty walking, decreased ROM, decreased strength, hypomobility, impaired flexibility, obesity, and pain.   ACTIVITY LIMITATIONS: carrying, lifting, bending, standing, squatting, sleeping, stairs, bathing, toileting, dressing, hygiene/grooming, and caring for others  PARTICIPATION LIMITATIONS: cleaning, laundry, shopping, community activity, and occupation  PERSONAL FACTORS: Fitness, Past/current experiences, Time since onset of injury/illness/exacerbation, and 3+ comorbidities: Fibromyalgia, DM, and CAD  are also affecting patient's functional outcome.   REHAB POTENTIAL: Fair chronicity of low back pain   CLINICAL DECISION MAKING: Stable/uncomplicated  EVALUATION COMPLEXITY: Low   GOALS: Goals reviewed with patient? No  SHORT TERM GOALS: Target date: 10/18/2022                      1. Pt will be independent with HEP in order to improve strength and balance in order to decrease fall  risk and improve function at home and work.                                  Baseline: NT  11/05/22: Performing exercises independently  Goal status: ACHIEVED   2.  Patient will demonstrate understanding of how to use TENS unit for pain modulation purposes while performing exercises. Baseline: NT  Goal status: ONGOING     LONG TERM GOALS: Target date: 12/13/2022  Patient will have improved function and activity level as evidenced by an increase in FOTO score by 10 points or more.  Baseline: 34/100 with target 50  Goal status: ONGOING    2.  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  Baseline: 8/10 NRPS   Goal status: ONGOING   3.  Patient will be able to bend down to floor and pickup weighted box of <=10 lbs as evidence of improved lumbar function while performing ADLs.   Baseline: Unable to perform  Goal status: ONGOING    PLAN:  PT FREQUENCY: 1-2x/week  PT DURATION: 10 weeks  PLANNED INTERVENTIONS: Therapeutic exercises, Therapeutic activity, Neuromuscular re-education, Balance training, Gait training, Patient/Family education, Self Care, Joint mobilization, Joint manipulation, Stair training, DME instructions, Aquatic Therapy, Dry Needling, Electrical stimulation, Spinal manipulation, Spinal mobilization, Cryotherapy, Moist heat, Traction, Ultrasound, Manual therapy, and Re-evaluation.  PLAN FOR NEXT SESSION: Discharge from PT    Ellin Goodie PT, DPT  [facility] [address], [place], [address] Phone: [phone]   Fax:  [phone]

## 2022-11-23 ENCOUNTER — Ambulatory Visit (INDEPENDENT_AMBULATORY_CARE_PROVIDER_SITE_OTHER): Payer: PRIVATE HEALTH INSURANCE | Admitting: Nurse Practitioner

## 2022-11-23 ENCOUNTER — Other Ambulatory Visit: Payer: Self-pay

## 2022-11-23 ENCOUNTER — Encounter: Payer: Self-pay | Admitting: Nurse Practitioner

## 2022-11-23 VITALS — BP 128/72 | HR 80 | Temp 98.0°F | Resp 16 | Ht 61.0 in | Wt 217.6 lb

## 2022-11-23 DIAGNOSIS — M5441 Lumbago with sciatica, right side: Secondary | ICD-10-CM | POA: Diagnosis not present

## 2022-11-23 DIAGNOSIS — G8929 Other chronic pain: Secondary | ICD-10-CM | POA: Diagnosis not present

## 2022-11-23 DIAGNOSIS — H60331 Swimmer's ear, right ear: Secondary | ICD-10-CM

## 2022-11-23 DIAGNOSIS — B372 Candidiasis of skin and nail: Secondary | ICD-10-CM

## 2022-11-23 MED ORDER — OFLOXACIN 0.3 % OT SOLN
5.0000 [drp] | Freq: Two times a day (BID) | OTIC | 0 refills | Status: DC
Start: 2022-11-23 — End: 2023-05-06

## 2022-11-23 MED ORDER — NYSTATIN 100000 UNIT/GM EX POWD
1.0000 | Freq: Three times a day (TID) | CUTANEOUS | 0 refills | Status: DC
Start: 2022-11-23 — End: 2023-05-06

## 2022-11-23 MED ORDER — NYSTATIN 100000 UNIT/GM EX CREA
1.0000 | TOPICAL_CREAM | Freq: Two times a day (BID) | CUTANEOUS | 0 refills | Status: DC
Start: 2022-11-23 — End: 2022-12-03

## 2022-11-23 NOTE — Progress Notes (Signed)
BP 128/72   Pulse 80   Temp 98 F (36.7 C) (Oral)   Resp 16   Ht 5\' 1"  (1.549 m)   Wt 217 lb 9.6 oz (98.7 kg)   SpO2 97%   BMI 41.12 kg/m    Subjective:    Patient ID: Claire [name], female    DOB: Jul 06, 1970, 52 y.o.   MRN: [id number]  HPI: Claire [name] is a 52 y.o. female  Chief Complaint  Patient presents with   Rash    On abdomen    Rash: patient reports she has a rash on her abdomen. She says it has been there for several days.  She has tried over the counter diaper cream with no improvement. She would like some nystatin. Prescription sent in.  Discussed keeping area clean and dry.    Chronic back pain: she is currently doing physical therapy.  She requests handicap permit.  From filled out for patient.   Right ear pain:  patient reports the last few days she has noticed popping in her right ear. Upon exam she does have otitis externa.  Will send in antibiotic drops.     Relevant past medical, surgical, family and social history reviewed and updated as indicated. Interim medical history since our last visit reviewed. Allergies and medications reviewed and updated.  Review of Systems  Constitutional: Negative for fever or weight change.  HEENT: right ear pain Respiratory: Negative for cough and shortness of breath.   Cardiovascular: Negative for chest pain or palpitations.  Gastrointestinal: Negative for abdominal pain, no bowel changes.  Musculoskeletal: Negative for gait problem or joint swelling.  Skin: positive for rash.  Neurological: Negative for dizziness or headache.  No other specific complaints in a complete review of systems (except as listed in HPI above).      Objective:    BP 128/72   Pulse 80   Temp 98 F (36.7 C) (Oral)   Resp 16   Ht 5\' 1"  (1.549 m)   Wt 217 lb 9.6 oz (98.7 kg)   SpO2 97%   BMI 41.12 kg/m   Wt Readings from Last 3 Encounters:  11/23/22 217 lb 9.6 oz (98.7 kg)  10/10/22 220 lb 3.2 oz (99.9 kg)  10/01/22  217 lb 12.8 oz (98.8 kg)    Physical Exam  Constitutional: Patient appears well-developed and well-nourished. Obese  No distress.  HEENT: head atraumatic, normocephalic, pupils equal and reactive to light, ears Right TM clear, ear canal red, neck supple, throat within normal limits Cardiovascular: Normal rate, regular rhythm and normal heart sounds.  No murmur heard. No BLE edema. Pulmonary/Chest: Effort normal and breath sounds normal. No respiratory distress. Abdominal: Soft.  There is no tenderness. SKin: red inderated skin in right groin Psychiatric: Patient has a normal mood and affect. behavior is normal. Judgment and thought content normal.  Results for orders placed or performed during the hospital encounter of 11/14/22  I-STAT creatinine  Result Value Ref Range   Creatinine, Ser 0.70 0.44 - 1.00 mg/dL      Assessment & Plan:   Problem List Items Addressed This Visit   None Visit Diagnoses     Yeast dermatitis    -  Primary   apply nystat cream while tender, then can use nystatin powder   Relevant Medications   nystatin (MYCOSTATIN/NYSTOP) powder   nystatin cream (MYCOSTATIN)   Acute swimmer's ear of right side       start ofloxacin drops   Relevant  Medications   ofloxacin (FLOXIN) 0.3 % OTIC solution   Chronic bilateral low back pain with right-sided sciatica       handicap permit signed, continue physical therapy        Follow up plan: Return if symptoms worsen or fail to improve.

## 2022-11-27 ENCOUNTER — Telehealth: Payer: Self-pay | Admitting: Physical Therapy

## 2022-11-27 ENCOUNTER — Ambulatory Visit: Payer: PRIVATE HEALTH INSURANCE | Admitting: Physical Therapy

## 2022-11-27 NOTE — Telephone Encounter (Signed)
Called pt to inquire about absence from PT. Pt continues to feel worsening pain with physical therapy despite regression in exercises.

## 2022-11-29 ENCOUNTER — Ambulatory Visit: Payer: PRIVATE HEALTH INSURANCE | Admitting: Physical Therapy

## 2022-11-30 ENCOUNTER — Ambulatory Visit: Payer: PRIVATE HEALTH INSURANCE | Admitting: Cardiology

## 2022-11-30 NOTE — Progress Notes (Signed)
Name: Claire [name]   MRN: [id number]    DOB: [birth year]   Date:12/03/2022       Progress Note  Subjective  Chief Complaint  Follow Up  HPI  Persistent Mood Affective Disorder : she was seeing Dr. Maryruth Bun but no longer covered by insurance   She is used to take zoloft but has been on  Trintelix 20 mg for a long time now. She has insomnia and failed Lunesta, Ambien, Trazodone , Temazepam.  Dr. Maryruth Bun was given Adderall but had to be stopped due to alcohol consumption. Her mood is about the same, she lost her father and has not developed healthy coping mechanism so she is drinking more again. Advised to reconsider seeing another psychiatrist and also a therapist   Asthma Mild Intermittentt: doing well at this time, she takes symbicort prn Unchanged   Rash: seen by Raynelle Fanning and was treated with antifungal but it did not improve, looks like folliculitis we will change to oral antibiotics    Migraine: she is still seeing Dr. Neale Burly - she is on  Zonegran and also gets triggers point injections prn. 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  Maximum weight of 265 lbs and was down to 118.3 lbs - lowest weight back in 08/2017), she was gaining weight since she started to feel better after umbilical hernia repair with  lysis of adhesions and cecopexy back in 04/01/2018, she had a bariatric surgery revision 11/03/2019 by Dr. Daphine Deutscher, at that time weight was 196 lb, and it went down to 184 lbs and is now gradually gaining it back, , stable since last visit around 215 lbs. She is back on Ozempic  due to spike on A1C . She continues to drink alcohol , Dr Gershon Crane recommended going up on dose of Ozempic 2 mg weekly A1C is at goal She had a cardiac calcium score and diagnosed with CAD no  angina  Elevated LFT's: she did not return for labs, she states she resumed drinking beer with B8 daily, 3-4 beers per day and more on weekends. Husband has asked her to cut down on amount. Discussed diagnosis of alcoholism, increase risk of cirrhosis for her since she already has metabolic syndrome and NASH is also a reason for liver cirrhosis. She still has been drinking alcohol    IBS/GERD history of bypass surgery:  She was doing well after  lysis of adhesions and cecopexy back in 04/01/2018. She states doing better, just needs to eat slowly    Atherosclerosis of aorta and hyperlipidemia: on statin therapy. No side effects LDL goal is below 100 and it was 102. Taking statin therapy    B12 deficiency: she has been getting B12 injections bi-monthly .    Thyroiditis: she is under the care of Dr. Gershon Crane , reviewed last TSH    Dizziness/syncope: seen at [facility], and has seen Dr. Christie Beckers 03/2019 , they ruled out POTS but may have had vasovagal syncope. She is doing better, seeing cardiologist now Currently seeing Dr. Sherryll Burger at [facility], had multiple tests done   History of COVID: diagnosed 04/21 and again 05/2021   she still has mental fogginess, and feel tired , she also has decrease in exercise tolerance. Unchanged   FMS: she  has been more compliant with Lyrica  now at 300 mg BID , discussed possible side effects of weight gain, but she states it helps with pain level. 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   S/P gastric  bypass 11/03/2019   B12 deficiency 04/10/2018   Columnar epithelial-lined lower esophagus    Postprocedural disorder of digestive system    Esophageal dysphagia    Persistent mood (affective) disorder, unspecified (HCC) 01/15/2018   Insomnia related to another mental disorder 01/15/2018   OCD (obsessive compulsive disorder) 10/29/2017   GAD (generalized anxiety disorder) 10/29/2017   Nephrolithiasis 10/22/2017   Atherosclerosis of aorta (HCC) 12/21/2016   Hiatal hernia 08/17/2016   Diverticulosis of colon 08/17/2016   Lumbosacral pain 01/07/2015   Intertrigo 11/05/2014   Perennial allergic rhinitis 09/03/2014   Lung nodules 09/03/2014   Vitamin D deficiency 09/03/2014   History of kidney stones 09/03/2014   History of iron deficiency anemia 09/03/2014   Mild persistent asthma without complication 11/21/2012   GERD without esophagitis 11/21/2012   History of hyperlipidemia 11/21/2012   Migraine with aura and without status migrainosus 11/21/2012   History of sleep apnea 11/21/2012   IBS (irritable bowel syndrome) 08/15/2012    Past Surgical History:  Procedure Laterality Date   BLADDER SURGERY  2010   BREAST BIOPSY Right 2014   stereotatic biopsy   CHOLECYSTECTOMY  2010   COLONOSCOPY  2014    Done at [facility]   COLONOSCOPY WITH PROPOFOL N/A 01/28/2018   Procedure: COLONOSCOPY WITH PROPOFOL;  Surgeon: Pasty Spillers, MD;  Location: [facility];  Service: Endoscopy;  Laterality: N/A;   DILITATION & CURRETTAGE/HYSTROSCOPY WITH NOVASURE ABLATION N/A 06/16/2015   Procedure: DILATATION & CURETTAGE/HYSTEROSCOPY WITH NOVASURE ABLATION;  Surgeon: Olivia Mackie, MD;  Location: [facility];  Service: Gynecology;  Laterality: N/A;   ESOPHAGOGASTRODUODENOSCOPY (EGD) WITH PROPOFOL N/A 01/28/2018   Procedure: ESOPHAGOGASTRODUODENOSCOPY (EGD) WITH BIOPSIES;  Surgeon: Pasty Spillers, MD;  Location: [facility];  Service: Endoscopy;  Laterality: N/A;   GASTRIC ROUX-EN-Y  N/A 11/03/2019   Procedure: LAPAROSCOPIC 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 WITH UPPER ENDOSCOPY;  Surgeon: Luretha Murphy, MD;  Location: [facility];  Service: General;  Laterality: N/A;   HIATAL HERNIA REPAIR N/A 11/03/2019   Procedure: HERNIA REPAIR HIATAL;  Surgeon: Luretha Murphy, MD;  Location: [facility];  Service: General;  Laterality: N/A;   LAPAROSCOPIC GASTRIC SLEEVE RESECTION  2012   LAPAROSCOPY N/A 06/16/2015   Procedure: LAPAROSCOPY DIAGNOSTIC;  Surgeon: Olivia Mackie, MD;  Location: [facility];  Service: Gynecology;  Laterality: N/A;   LAPAROSCOPY N/A 04/01/2018   Procedure: LAPAROSCOPY DIAGNOSTIC ERAS PATHWAY ENTEROLYSIS, CECOPEXY;  Surgeon: Luretha Murphy, MD;  Location: [facility];  Service: General;  Laterality: N/A;   LITHOTRIPSY  12/11/2017   laser lithotripsy   LYSIS OF ADHESION N/A 06/16/2015   Procedure: LYSIS OF ADHESION;  Surgeon: Olivia Mackie, MD;  Location: [facility];  Service: Gynecology;  Laterality: N/A;   PLANTAR FASCIA SURGERY Bilateral  ROBOTIC ASSISTED SALPINGO OOPHERECTOMY Right 06/16/2015   Procedure: ROBOTIC ASSISTED SALPINGO OOPHORECTOMY, EXCISION OF RIGHT CUL DE SAC MASS;  Surgeon: Olivia Mackie, MD;  Location: [facility];  Service: Gynecology;  Laterality: Right;   TONSILLECTOMY     UPPER GI ENDOSCOPY      Family History  Problem Relation Age of Onset   Heart attack Mother    Stroke Mother    Multiple myeloma Mother    Hyperlipidemia Father    Lung cancer Father    Heart attack Sister    Lung cancer Maternal Grandfather    Breast cancer Paternal Grandmother    Heart attack Paternal Grandfather    Heart attack Maternal Uncle    Breast cancer Paternal Aunt     Social History   Tobacco Use   Smoking status: Former    Current packs/day: 0.00    Average packs/day: 1 pack/day for 10.0 years (10.0 ttl pk-yrs)    Types: Cigarettes    Start date: 02/20/1983    Quit date: 02/19/1993    Years since quitting: 29.8   Smokeless  tobacco: Never  Substance Use Topics   Alcohol use: Yes    Alcohol/week: 0.0 standard drinks of alcohol    Comment: occasionally     Current Outpatient Medications:    albuterol (VENTOLIN HFA) 108 (90 Base) MCG/ACT inhaler, TAKE 2 PUFFS EVERY 6 HOURS AS NEEDED FOR WHEEZING OR SHORTNESS OF BREATH. (VENTOLIN NOT COVERED), Disp: 8.5 each, Rfl: 2   aspirin EC 81 MG tablet, Take 1 tablet (81 mg total) by mouth daily. Swallow whole., Disp: 90 tablet, Rfl: 3   atorvastatin (LIPITOR) 40 MG tablet, Take 1 tablet (40 mg total) by mouth daily., Disp: 90 tablet, Rfl: 3   budesonide-formoterol (SYMBICORT) 160-4.5 MCG/ACT inhaler, INHALE 2 PUFFS INTO THE LUNGS TWICE A DAY, Disp: 10.2 each, Rfl: 2   calcium citrate-vitamin D (CALCIUM CITRATE CHEWY BITE) 500-500 MG-UNIT chewable tablet, Chew 1 tablet by mouth 3 (three) times daily., Disp: , Rfl:    clobetasol cream (TEMOVATE) 0.05 %, SMARTSIG:Sparingly Topical Twice Daily, Disp: , Rfl:    clobetasol ointment (TEMOVATE) 0.05 %, Apply 1 application topically daily as needed (psoriasis). , Disp: , Rfl: 0   cyanocobalamin (VITAMIN B12) 1000 MCG/ML injection, Inject 1 mL (1,000 mcg total) into the muscle every 14 (fourteen) days., Disp: 6 mL, Rfl: 1   cyclobenzaprine (FLEXERIL) 10 MG tablet, Take 10 mg by mouth at bedtime as needed for muscle spasms., Disp: , Rfl:    fluocinonide (LIDEX) 0.05 % external solution, Apply 1 application topically 2 (two) times daily as needed (psoriasis). , Disp: , Rfl:    IRON-VITAMIN C PO, Take 1 tablet by mouth daily., Disp: , Rfl:    ketoconazole (NIZORAL) 2 % shampoo, Apply 1 application topically 2 (two) times daily as needed (psoriasis). , Disp: , Rfl:    Lactobacillus (ACIDOPHILUS PO), Take 1 capsule by mouth daily. 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 , Disp: , Rfl: 4   mometasone (NASONEX) 50 MCG/ACT nasal spray, PLACE  2 SPRAYS INTO THE NOSE DAILY., Disp: 51 each, Rfl: 1   montelukast (SINGULAIR) 10 MG tablet, Take 1 tablet (10 mg total) by mouth at bedtime. TAKE 1 TABLET(10 MG) BY MOUTH AT BEDTIME, Disp: 90 tablet, Rfl: 3   Multiple Vitamins-Minerals (MULTIVITAMIN ADULT) CHEW, Chew 1 tablet by mouth daily., Disp: , Rfl:  nystatin (MYCOSTATIN/NYSTOP) powder, Apply 1 Application topically 3 (three) times daily., Disp: 15 g, Rfl: 0   ofloxacin (FLOXIN) 0.3 % OTIC solution, Place 5 drops into the right ear 2 (two) times daily., Disp: 10 mL, Rfl: 0   ondansetron (ZOFRAN) 4 MG tablet, Take 4 mg by mouth every 8 (eight) hours as needed., Disp: , Rfl:    pantoprazole (PROTONIX) 40 MG tablet, Take 40 mg by mouth daily., Disp: , Rfl:    Semaglutide, 2 MG/DOSE, 8 MG/3ML SOPN, Inject 2 mg as directed once a week., Disp: 9 mL, Rfl: 1   sulfamethoxazole-trimethoprim (BACTRIM DS) 800-160 MG tablet, Take 1 tablet by mouth 2 (two) times daily., Disp: 14 tablet, Rfl: 0   triamcinolone cream (KENALOG) 0.1 %, Apply 1 application topically 2 (two) times daily., Disp: 453.6 g, Rfl: 0   zonisamide (ZONEGRAN) 50 MG capsule, Take 150 mg by mouth daily. , Disp: , Rfl: 2   Accu-Chek FastClix Lancets MISC, USE TO TEST UP TO 4 TIMES A DAY (Patient not taking: Reported on 11/23/2022), Disp: , Rfl:    ACCU-CHEK GUIDE test strip, , Disp: , Rfl:    fluconazole (DIFLUCAN) 150 MG tablet, Take 1 tablet PO once. Repeat in 3 days if needed., Disp: 2 tablet, Rfl: 0   modafinil (PROVIGIL) 100 MG tablet, Take 1 tablet (100 mg total) by mouth daily., Disp: 90 tablet, Rfl: 0   pregabalin (LYRICA) 300 MG capsule, Take 1 capsule (300 mg total) by mouth 2 (two) times daily., Disp: 180 capsule, Rfl: 1   traZODone (DESYREL) 100 MG tablet, Take 2 tablets (200 mg total) by mouth at bedtime., Disp: 180 tablet, Rfl: 1   TRINTELLIX 20 MG TABS tablet, Take 1 tablet (20 mg total) by mouth daily., Disp: 90 tablet, Rfl: 1   [START ON 12/08/2022] Vitamin D,  Ergocalciferol, (DRISDOL) 1.25 MG (50000 UNIT) CAPS capsule, Take 1 capsule (50,000 Units total) by mouth every Saturday., Disp: 12 capsule, Rfl: 1  Allergies  Allergen Reactions   Aspartame And Phenylalanine Nausea Only and Other (See Comments)    GI upset    Gabapentin     Bladder incontinence at higher dose   Glucose Nausea Only and Nausea And Vomiting   Linzess [Linaclotide]     Worsening of diarrhea   Other Hives and Other (See Comments)    Ricotta Cheese: flushed and hot   Triamcinolone Other (See Comments)   Cymbalta [Duloxetine Hcl] Palpitations    And photosensitivity   Tape Rash    Some clear tapes   Tapentadol Rash    Some clear tapes    I personally reviewed active problem list, medication list, allergies, family history, social history, health maintenance with the patient/caregiver today.   ROS  Ten systems reviewed and is negative except as mentioned in HPI    Objective  Vitals:   12/03/22 1159  BP: 122/74  Pulse: 80  Resp: 16  Temp: 97.8 F (36.6 C)  TempSrc: Oral  SpO2: 95%  Weight: 215 lb 1.6 oz (97.6 kg)  Height: 5\' 1"  (1.549 m)    Body mass index is 40.64 kg/m.  Physical Exam  Constitutional: Patient appears well-developed and well-nourished. Obese  No distress.  HEENT: head atraumatic, normocephalic, pupils equal and reactive to light, neck supple Cardiovascular: Normal rate, regular rhythm and normal heart sounds.  No murmur heard. No BLE edema. Pulmonary/Chest: Effort normal and breath sounds normal. No respiratory distress. Abdominal: Soft.  There is no tenderness. Skin: erythematous rash localized  on right side of vulva , on hair follicles only  Psychiatric: Patient has a normal mood and affect. behavior is normal. Judgment and thought content normal.   Recent Results (from the past 2160 hour(s))  HM MAMMOGRAPHY     Status: None   Collection Time: 09/28/22 12:00 AM  Result Value Ref Range   HM Mammogram 0-4 Bi-Rad 0-4 Bi-Rad, Self  Reported Normal    Comment: [facility]  ECHOCARDIOGRAM COMPLETE     Status: None   Collection Time: 11/06/22  3:39 PM  Result Value Ref Range   Area-P 1/2 3.72 cm2   Est EF 60 - 65%   I-STAT creatinine     Status: None   Collection Time: 11/14/22  1:50 PM  Result Value Ref Range   Creatinine, Ser 0.70 0.44 - 1.00 mg/dL  POCT HgB Z6X     Status: Normal   Collection Time: 12/03/22 12:03 PM  Result Value Ref Range   Hemoglobin A1C 5.6 4.0 - 5.6 %   HbA1c POC (<> result, manual entry)     HbA1c, POC (prediabetic range)     HbA1c, POC (controlled diabetic range)      PHQ2/9:    12/03/2022   12:01 PM 11/23/2022    9:05 AM 09/14/2022    2:57 PM 07/09/2022    8:40 AM 02/07/2022    8:10 AM  Depression screen PHQ 2/9  Decreased Interest 0 0 0 0 0  Down, Depressed, Hopeless 0 0 0 0 0  PHQ - 2 Score 0 0 0 0 0  Altered sleeping 0  0 0 0  Tired, decreased energy 0  0 0 0  Change in appetite 0  0 0 0  Feeling bad or failure about yourself  0  0 0 0  Trouble concentrating 0  0 0 0  Moving slowly or fidgety/restless 0  0 0 0  Suicidal thoughts 0  0 0 0  PHQ-9 Score 0  0 0 0  Difficult doing work/chores   Not difficult at all  Not difficult at all    phq 9 is negative   Fall Risk:    11/23/2022    9:05 AM 09/14/2022    2:57 PM 07/09/2022    8:40 AM 02/07/2022    8:09 AM 01/08/2022   11:38 AM  Fall Risk   Falls in the past year? 1 0 0 0 0  Number falls in past yr: 0 0  0 0  Injury with Fall? 0 0  0 0  Risk for fall due to : History of fall(s);No Fall Risks  No Fall Risks No Fall Risks No Fall Risks  Follow up Falls prevention discussed  Falls prevention discussed Education provided;Falls evaluation completed;Falls prevention discussed Falls prevention discussed    Assessment & Plan  1. Type 2 diabetes mellitus with cardiac complication (HCC)  - POCT HgB A1C - Lipid panel - COMPLETE METABOLIC PANEL WITH GFR - Microalbumin / creatinine urine ratio  2. Need for  immunization against influenza  - Flu vaccine trivalent PF, 6mos and older(Flulaval,Afluria,Fluarix,Fluzone)  3. Atherosclerosis of aorta (HCC)  Continue statin therapy   4. Persistent mood (affective) disorder, unspecified (HCC)  - TRINTELLIX 20 MG TABS tablet; Take 1 tablet (20 mg total) by mouth daily.  Dispense: 90 tablet; Refill: 1  5. Vitamin D deficiency  - Vitamin D, Ergocalciferol, (DRISDOL) 1.25 MG (50000 UNIT) CAPS capsule; Take 1 capsule (50,000 Units total) by mouth every Saturday.  Dispense: 12 capsule;  Refill: 1 - VITAMIN D 25 Hydroxy (Vit-D Deficiency, Fractures)  6. Folliculitis  - sulfamethoxazole-trimethoprim (BACTRIM DS) 800-160 MG tablet; Take 1 tablet by mouth 2 (two) times daily.  Dispense: 14 tablet; Refill: 0  7. Chronic insomnia  - traZODone (DESYREL) 100 MG tablet; Take 2 tablets (200 mg total) by mouth at bedtime.  Dispense: 180 tablet; Refill: 1  8. Fibromyalgia  - pregabalin (LYRICA) 300 MG capsule; Take 1 capsule (300 mg total) by mouth 2 (two) times daily.  Dispense: 180 capsule; Refill: 1  9. B12 deficiency  - cyanocobalamin (VITAMIN B12) injection 1,000 mcg - CBC with Differential/Platelet - B12 and Folate Panel  10. Attention deficit hyperactivity disorder (ADHD), unspecified ADHD type  - modafinil (PROVIGIL) 100 MG tablet; Take 1 tablet (100 mg total) by mouth daily.  Dispense: 90 tablet; Refill: 0  11. Antibiotic-induced yeast infection  - fluconazole (DIFLUCAN) 150 MG tablet; Take 1 tablet PO once. Repeat in 3 days if needed.  Dispense: 2 tablet; Refill: 0  12. Coronary artery disease involving native coronary artery of native heart without angina pectoris  On statin therapy, seeing cardiologist

## 2022-12-03 ENCOUNTER — Ambulatory Visit (INDEPENDENT_AMBULATORY_CARE_PROVIDER_SITE_OTHER): Payer: PRIVATE HEALTH INSURANCE | Admitting: Family Medicine

## 2022-12-03 ENCOUNTER — Ambulatory Visit: Payer: PRIVATE HEALTH INSURANCE | Admitting: Physical Therapy

## 2022-12-03 ENCOUNTER — Encounter: Payer: Self-pay | Admitting: Neurosurgery

## 2022-12-03 VITALS — BP 122/74 | HR 80 | Temp 97.8°F | Resp 16 | Ht 61.0 in | Wt 215.1 lb

## 2022-12-03 DIAGNOSIS — B379 Candidiasis, unspecified: Secondary | ICD-10-CM

## 2022-12-03 DIAGNOSIS — I700 Atherosclerosis of aorta: Secondary | ICD-10-CM

## 2022-12-03 DIAGNOSIS — F5104 Psychophysiologic insomnia: Secondary | ICD-10-CM

## 2022-12-03 DIAGNOSIS — L739 Follicular disorder, unspecified: Secondary | ICD-10-CM

## 2022-12-03 DIAGNOSIS — Z23 Encounter for immunization: Secondary | ICD-10-CM | POA: Diagnosis not present

## 2022-12-03 DIAGNOSIS — I2510 Atherosclerotic heart disease of native coronary artery without angina pectoris: Secondary | ICD-10-CM

## 2022-12-03 DIAGNOSIS — E1159 Type 2 diabetes mellitus with other circulatory complications: Secondary | ICD-10-CM

## 2022-12-03 DIAGNOSIS — F909 Attention-deficit hyperactivity disorder, unspecified type: Secondary | ICD-10-CM

## 2022-12-03 DIAGNOSIS — F349 Persistent mood [affective] disorder, unspecified: Secondary | ICD-10-CM

## 2022-12-03 DIAGNOSIS — E559 Vitamin D deficiency, unspecified: Secondary | ICD-10-CM

## 2022-12-03 DIAGNOSIS — E538 Deficiency of other specified B group vitamins: Secondary | ICD-10-CM | POA: Diagnosis not present

## 2022-12-03 DIAGNOSIS — M797 Fibromyalgia: Secondary | ICD-10-CM

## 2022-12-03 DIAGNOSIS — E1169 Type 2 diabetes mellitus with other specified complication: Secondary | ICD-10-CM

## 2022-12-03 LAB — POCT GLYCOSYLATED HEMOGLOBIN (HGB A1C): Hemoglobin A1C: 5.6 % (ref 4.0–5.6)

## 2022-12-03 MED ORDER — CYANOCOBALAMIN 1000 MCG/ML IJ SOLN
1000.0000 ug | Freq: Once | INTRAMUSCULAR | Status: AC
Start: 2022-12-03 — End: 2022-12-03
  Administered 2022-12-03: 1000 ug via INTRAMUSCULAR

## 2022-12-03 MED ORDER — TRINTELLIX 20 MG PO TABS
20.0000 mg | ORAL_TABLET | Freq: Every day | ORAL | 1 refills | Status: DC
Start: 2022-12-03 — End: 2023-05-06

## 2022-12-03 MED ORDER — SEMAGLUTIDE (2 MG/DOSE) 8 MG/3ML ~~LOC~~ SOPN: 2.0000 mg | PEN_INJECTOR | SUBCUTANEOUS | 1 refills | Status: DC

## 2022-12-03 MED ORDER — TRAZODONE HCL 100 MG PO TABS
200.0000 mg | ORAL_TABLET | Freq: Every day | ORAL | 1 refills | Status: DC
Start: 2022-12-03 — End: 2023-05-06

## 2022-12-03 MED ORDER — PREGABALIN 300 MG PO CAPS
300.0000 mg | ORAL_CAPSULE | Freq: Two times a day (BID) | ORAL | 1 refills | Status: DC
Start: 2022-12-03 — End: 2023-05-06

## 2022-12-03 MED ORDER — SULFAMETHOXAZOLE-TRIMETHOPRIM 800-160 MG PO TABS
1.0000 | ORAL_TABLET | Freq: Two times a day (BID) | ORAL | 0 refills | Status: DC
Start: 2022-12-03 — End: 2022-12-17

## 2022-12-03 MED ORDER — FLUCONAZOLE 150 MG PO TABS
ORAL_TABLET | ORAL | 0 refills | Status: DC
Start: 2022-12-03 — End: 2023-05-06

## 2022-12-03 MED ORDER — MODAFINIL 100 MG PO TABS: 100.0000 mg | ORAL_TABLET | Freq: Every day | ORAL | 0 refills | Status: DC

## 2022-12-03 MED ORDER — VITAMIN D (ERGOCALCIFEROL) 1.25 MG (50000 UNIT) PO CAPS
50000.0000 [IU] | ORAL_CAPSULE | ORAL | 1 refills | Status: DC
Start: 2022-12-08 — End: 2023-11-06

## 2022-12-03 NOTE — Patient Instructions (Signed)
[facility]ology [facility] [address], [place], [place] [address]  Phone: [phone] Fax: [phone]

## 2022-12-05 ENCOUNTER — Encounter: Payer: PRIVATE HEALTH INSURANCE | Admitting: Physical Therapy

## 2022-12-05 LAB — HM DIABETES EYE EXAM

## 2022-12-10 ENCOUNTER — Encounter: Payer: PRIVATE HEALTH INSURANCE | Admitting: Physical Therapy

## 2022-12-12 ENCOUNTER — Encounter: Payer: PRIVATE HEALTH INSURANCE | Admitting: Physical Therapy

## 2022-12-14 ENCOUNTER — Encounter: Payer: Self-pay | Admitting: Cardiology

## 2022-12-14 ENCOUNTER — Ambulatory Visit: Payer: PRIVATE HEALTH INSURANCE | Attending: Cardiology | Admitting: Cardiology

## 2022-12-14 VITALS — BP 116/78 | HR 71 | Ht 61.0 in | Wt 216.6 lb

## 2022-12-14 DIAGNOSIS — I2510 Atherosclerotic heart disease of native coronary artery without angina pectoris: Secondary | ICD-10-CM

## 2022-12-14 DIAGNOSIS — Z6841 Body Mass Index (BMI) 40.0 and over, adult: Secondary | ICD-10-CM | POA: Diagnosis not present

## 2022-12-14 DIAGNOSIS — E782 Mixed hyperlipidemia: Secondary | ICD-10-CM | POA: Diagnosis not present

## 2022-12-14 NOTE — Patient Instructions (Signed)

## 2022-12-14 NOTE — Progress Notes (Signed)
Cardiology Office Note:    Date:  12/14/2022   ID:  Claire [name], DOB 02/12/1971, MRN [id number]  PCP:  Alba Cory, MD  [facility] Cardiologist:  Debbe Odea, MD  [facility] Electrophysiologist:  None   Referring MD: Alba Cory, MD   Chief Complaint  Patient presents with   Follow-up    Discuss cardiac testing results.  Patient denies new or acute cardiac problems/concerns today.      History of Present Illness:    Claire [name] is a 52 y.o. female with a hx of CAD (non obstructive 25%LAD), diabetes, depression, hyperlipidemia, family history of CAD who presents for follow-up.    Previously seen due to chest pain.  Coronary CTA was obtained to evaluate any progression of CAD.  Overall she feels well, restarted Lipitor as prescribed a month ago.  Compliant with aspirin as prescribed.  Feels well, no concerns at this time.   Prior notes  echo 07/2019 showed normal systolic and diastolic function, EF 60 to 65%.  Coronary CTA 08/2019 calcium score 175, mild LAD disease 25%.   Past Medical History:  Diagnosis Date   Anemia    Anxiety    Arthritis    hands, hips   Asthma    Bilateral leg cramps    Depression    Diabetes mellitus without complication (HCC)    history no longer a problem since weight loss surgery   DJD (degenerative joint disease)    Dyspnea on exertion    a. 07/2019 Echo: EF 60-65%, no rwma, nl RV fxn, triv MR.   GERD (gastroesophageal reflux disease)    Headache    Migraines   IBS (irritable bowel syndrome)    Kidney stone    Low serum vitamin B12    Lung nodules    Migraine    down to approx 4x/mo since starting meds   Muscle spasm    Nonobstructive CAD (coronary artery disease)    a. 06/2014 ETT: Ex time 8:59, max HR 155, 10.1 METS, no ST/T changes; b. 08/2019 Cor CTA: Ca2+ 175 (99th %'ile), LAD 35-49p, LCX 0-75m-->Med rx.   Psoriasis    vaginal area   Seasonal allergies    Sleep apnea    history no longer a  problem since weight loss surgery   Vasovagal syncope    [place], Dr. Ples Specter    Vitamin D deficiency     Past Surgical History:  Procedure Laterality Date   BLADDER SURGERY  2010   BREAST BIOPSY Right 2014   stereotatic biopsy   CHOLECYSTECTOMY  2010   COLONOSCOPY  2014    Done at [facility]   COLONOSCOPY WITH PROPOFOL N/A 01/28/2018   Procedure: COLONOSCOPY WITH PROPOFOL;  Surgeon: Pasty Spillers, MD;  Location: [facility];  Service: Endoscopy;  Laterality: N/A;   DILITATION & CURRETTAGE/HYSTROSCOPY WITH NOVASURE ABLATION N/A 06/16/2015   Procedure: DILATATION & CURETTAGE/HYSTEROSCOPY WITH NOVASURE ABLATION;  Surgeon: Olivia Mackie, MD;  Location: [facility];  Service: Gynecology;  Laterality: N/A;   ESOPHAGOGASTRODUODENOSCOPY (EGD) WITH PROPOFOL N/A 01/28/2018   Procedure: ESOPHAGOGASTRODUODENOSCOPY (EGD) WITH BIOPSIES;  Surgeon: Pasty Spillers, MD;  Location: [facility];  Service: Endoscopy;  Laterality: N/A;   GASTRIC ROUX-EN-Y N/A 11/03/2019   Procedure: LAPAROSCOPIC 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 WITH UPPER ENDOSCOPY;  Surgeon: Luretha Murphy, MD;  Location: [facility];  Service: General;  Laterality: N/A;   HIATAL HERNIA REPAIR N/A 11/03/2019   Procedure: HERNIA REPAIR HIATAL;  Surgeon:  Luretha Murphy, MD;  Location: [facility];  Service: General;  Laterality: N/A;   LAPAROSCOPIC GASTRIC SLEEVE RESECTION  2012   LAPAROSCOPY N/A 06/16/2015   Procedure: LAPAROSCOPY DIAGNOSTIC;  Surgeon: Olivia Mackie, MD;  Location: [facility];  Service: Gynecology;  Laterality: N/A;   LAPAROSCOPY N/A 04/01/2018   Procedure: LAPAROSCOPY DIAGNOSTIC ERAS PATHWAY ENTEROLYSIS, CECOPEXY;  Surgeon: Luretha Murphy, MD;  Location: [facility];  Service: General;  Laterality: N/A;   LITHOTRIPSY  12/11/2017   laser lithotripsy   LYSIS OF ADHESION N/A 06/16/2015   Procedure: LYSIS OF ADHESION;  Surgeon: Olivia Mackie, MD;  Location: [facility];  Service: Gynecology;   Laterality: N/A;   PLANTAR FASCIA SURGERY Bilateral    ROBOTIC ASSISTED SALPINGO OOPHERECTOMY Right 06/16/2015   Procedure: ROBOTIC ASSISTED SALPINGO OOPHORECTOMY, EXCISION OF RIGHT CUL DE SAC MASS;  Surgeon: Olivia Mackie, MD;  Location: [facility];  Service: Gynecology;  Laterality: Right;   TONSILLECTOMY     UPPER GI ENDOSCOPY      Current Medications: Current Meds  Medication Sig   albuterol (VENTOLIN HFA) 108 (90 Base) MCG/ACT inhaler TAKE 2 PUFFS EVERY 6 HOURS AS NEEDED FOR WHEEZING OR SHORTNESS OF BREATH. (VENTOLIN NOT COVERED)   aspirin EC 81 MG tablet Take 1 tablet (81 mg total) by mouth daily. Swallow whole.   atorvastatin (LIPITOR) 40 MG tablet Take 1 tablet (40 mg total) by mouth daily.   budesonide-formoterol (SYMBICORT) 160-4.5 MCG/ACT inhaler INHALE 2 PUFFS INTO THE LUNGS TWICE A DAY   calcium citrate-vitamin D (CALCIUM CITRATE CHEWY BITE) 500-500 MG-UNIT chewable tablet Chew 1 tablet by mouth 3 (three) times daily.   clobetasol cream (TEMOVATE) 0.05 % SMARTSIG:Sparingly Topical Twice Daily   clobetasol ointment (TEMOVATE) 0.05 % Apply 1 application topically daily as needed (psoriasis).    cyanocobalamin (VITAMIN B12) 1000 MCG/ML injection Inject 1 mL (1,000 mcg total) into the muscle every 14 (fourteen) days.   cyclobenzaprine (FLEXERIL) 10 MG tablet Take 10 mg by mouth at bedtime as needed for muscle spasms.   fluconazole (DIFLUCAN) 150 MG tablet Take 1 tablet PO once. Repeat in 3 days if needed.   fluocinonide (LIDEX) 0.05 % external solution Apply 1 application topically 2 (two) times daily as needed (psoriasis).    IRON-VITAMIN C PO Take 1 tablet by mouth daily.   ketoconazole (NIZORAL) 2 % shampoo Apply 1 application topically 2 (two) times daily as needed (psoriasis).    Lactobacillus (ACIDOPHILUS PO) Take 1 capsule by mouth daily. Probiotic plus Calcium   modafinil (PROVIGIL) 100 MG tablet Take 1 tablet (100 mg total) by mouth daily.   mometasone (ELOCON) 0.1 % lotion  Apply 1 application topically daily as needed (psoriasis in the ears).    mometasone (NASONEX) 50 MCG/ACT nasal spray PLACE 2 SPRAYS INTO THE NOSE DAILY.   montelukast (SINGULAIR) 10 MG tablet Take 1 tablet (10 mg total) by mouth at bedtime. TAKE 1 TABLET(10 MG) BY MOUTH AT BEDTIME   Multiple Vitamins-Minerals (MULTIVITAMIN ADULT) CHEW Chew 1 tablet by mouth daily.   nystatin (MYCOSTATIN/NYSTOP) powder Apply 1 Application topically 3 (three) times daily.   ofloxacin (FLOXIN) 0.3 % OTIC solution Place 5 drops into the right ear 2 (two) times daily.   ondansetron (ZOFRAN) 4 MG tablet Take 4 mg by mouth every 8 (eight) hours as needed.   pantoprazole (PROTONIX) 40 MG tablet Take 40 mg by mouth daily.   pregabalin (LYRICA) 300 MG capsule Take 1 capsule (300 mg total) by mouth 2 (two) times daily.  Semaglutide, 2 MG/DOSE, 8 MG/3ML SOPN Inject 2 mg as directed once a week.   traZODone (DESYREL) 100 MG tablet Take 2 tablets (200 mg total) by mouth at bedtime.   triamcinolone cream (KENALOG) 0.1 % Apply 1 application topically 2 (two) times daily.   TRINTELLIX 20 MG TABS tablet Take 1 tablet (20 mg total) by mouth daily.   Vitamin D, Ergocalciferol, (DRISDOL) 1.25 MG (50000 UNIT) CAPS capsule Take 1 capsule (50,000 Units total) by mouth every Saturday.   zonisamide (ZONEGRAN) 50 MG capsule Take 150 mg by mouth daily.      Allergies:   Aspartame and phenylalanine, Gabapentin, Glucose, Linzess [linaclotide], Other, Triamcinolone, Cymbalta [duloxetine hcl], Tape, and Tapentadol   Social History   Socioeconomic History   Marital status: Married    Spouse name: Weston Brass    Number of children: 3   Years of education: Not on file   Highest education level: Some college, no degree  Occupational History   Occupation: unemployed   Tobacco Use   Smoking status: Former    Current packs/day: 0.00    Average packs/day: 1 pack/day for 10.0 years (10.0 ttl pk-yrs)    Types: Cigarettes    Start date: 02/20/1983     Quit date: 02/19/1993    Years since quitting: 29.8   Smokeless tobacco: Never  Vaping Use   Vaping status: Never Used  Substance and Sexual Activity   Alcohol use: Yes    Alcohol/week: 0.0 standard drinks of alcohol    Comment: occasionally   Drug use: No   Sexual activity: Yes    Partners: Male    Comment: last sex 04 Jan 2018  Other Topics Concern   Not on file  Social History Narrative   Not working since September 2021 - 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, 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    Social Determinants of Health   Financial Resource Strain: High Risk (12/03/2022)   Overall Financial Resource Strain (CARDIA)    Difficulty of Paying Living Expenses: Hard  Food Insecurity: Food Insecurity Present (12/03/2022)   Hunger Vital Sign    Worried About Running Out of Food in the Last Year: Sometimes true    Ran Out of Food in the Last Year: Sometimes true  Transportation Needs: No Transportation Needs (12/03/2022)   PRAPARE - Administrator, Civil Service (Medical): No    Lack of Transportation (Non-Medical): No  Physical Activity: Insufficiently Active (12/03/2022)   Exercise Vital Sign    Days of Exercise per Week: 2 days    Minutes of Exercise per Session: 10 min  Stress: No Stress Concern Present (12/03/2022)   [employer] of Occupational Health - Occupational Stress Questionnaire    Feeling of Stress : Only a little  Social Connections: Moderately Isolated (12/03/2022)   Social Connection and Isolation Panel [NHANES]    Frequency of Communication with Friends and Family: Three times a week    Frequency of Social Gatherings with Friends and Family: Twice a week    Attends Religious Services: Never    Database administrator or Organizations: No    Attends Engineer, structural: Not on file    Marital Status: Married     Family History: The patient's family history includes Breast cancer in her paternal aunt  and paternal grandmother; Heart attack in her maternal uncle, mother, paternal grandfather, and sister; Hyperlipidemia in her father; Lung cancer in her father and maternal grandfather; Multiple  myeloma in her mother; Stroke in her mother.  ROS:   Please see the history of present illness.     All other systems reviewed and are negative.  EKGs/Labs/Other Studies Reviewed:    The following studies were reviewed today:        Recent Labs: 01/08/2022: Hemoglobin 15.1; Platelets 256 07/09/2022: ALT 65; BUN 8; Potassium 4.3; Sodium 139 11/14/2022: Creatinine, Ser 0.70  Recent Lipid Panel    Component Value Date/Time   CHOL 190 01/08/2022 1227   CHOL 179 12/31/2018 0857   TRIG 143 01/08/2022 1227   HDL 64 01/08/2022 1227   HDL 64 12/31/2018 0857   CHOLHDL 3.0 01/08/2022 1227   VLDL 20 11/11/2015 0846   LDLCALC 102 (H) 01/08/2022 1227    Physical Exam:    VS:  BP 116/78 (BP Location: Left Arm, Patient Position: Sitting, Cuff Size: Large)   Pulse 71   Ht 5\' 1"  (1.549 m)   Wt 216 lb 9.6 oz (98.2 kg)   SpO2 98%   BMI 40.93 kg/m     Wt Readings from Last 3 Encounters:  12/14/22 216 lb 9.6 oz (98.2 kg)  12/03/22 215 lb 1.6 oz (97.6 kg)  11/23/22 217 lb 9.6 oz (98.7 kg)     GEN:  Well nourished, well developed in no acute distress HEENT: Normal NECK: No JVD; No carotid bruits CARDIAC: RRR, no murmurs, rubs, gallops RESPIRATORY:  Clear to auscultation without rales, wheezing or rhonchi  ABDOMEN: Soft, non-tender, non-distended MUSCULOSKELETAL:  No edema; right chest wall tenderness with palpation. SKIN: Warm and dry NEUROLOGIC:  Alert and oriented x 3 PSYCHIATRIC:  Normal affect   ASSESSMENT:    1. Coronary artery disease involving native coronary artery of native heart without angina pectoris   2. Mixed hyperlipidemia   3. BMI 40.0-44.9, adult [facility])     PLAN:    In order of problems listed above:  Chest pain, coronary CT 9/24 shows mild nonobstructive LAD  stenosis.  No significant change from prior.  Echo 9/24 with normal EF 60 to 65%.  Continue aspirin 81 mg daily, Lipitor 40 mg daily.   LDL goal less than 70, obtain fasting lipid profile as scheduled with PCP next month.  Titrate Lipitor if LDL not at goal. Morbid obesity, low-calorie diet, weight loss advised.  Follow-up yearly.   Medication Adjustments/Labs and Tests Ordered: Current medicines are reviewed at length with the patient today.  Concerns regarding medicines are outlined above.  No orders of the defined types were placed in this encounter.  No orders of the defined types were placed in this encounter.   Patient Instructions  Medication Instructions:   Your physician recommends that you continue on your current medications as directed. Please refer to the Current Medication list given to you today.  *If you need a refill on your cardiac medications before your next appointment, please call your pharmacy*   Lab Work:  None Ordered  If you have labs (blood work) drawn today and your tests are completely normal, you will receive your results only by: MyChart Message (if you have MyChart) OR A paper copy in the mail If you have any lab test that is abnormal or we need to change your treatment, we will call you to review the results.   Testing/Procedures:  None Ordered   Follow-Up: At [facility], you and your health needs are our priority.  As part of our continuing mission to provide you with exceptional heart care, we  have created designated Provider Care Teams.  These Care Teams include your primary Cardiologist (physician) and Advanced Practice Providers (APPs -  Physician Assistants and Nurse Practitioners) who all work together to provide you with the care you need, when you need it.  We recommend signing up for the patient portal called "MyChart".  Sign up information is provided on this After Visit Summary.  MyChart is used to connect with patients  for Virtual Visits (Telemedicine).  Patients are able to view lab/test results, encounter notes, upcoming appointments, etc.  Non-urgent messages can be sent to your provider as well.   To learn more about what you can do with MyChart, go to [email or web address].    Your next appointment:   12 month(s)  Provider:   You may see Debbe Odea, MD or one of the following Advanced Practice Providers on your designated Care Team:   Nicolasa Ducking, NP Eula Listen, PA-C Cadence Fransico Michael, PA-C Charlsie Quest, NP   Signed, Debbe Odea, MD  12/14/2022 10:18 AM    [facility]

## 2022-12-17 ENCOUNTER — Ambulatory Visit (INDEPENDENT_AMBULATORY_CARE_PROVIDER_SITE_OTHER): Payer: PRIVATE HEALTH INSURANCE | Admitting: Neurosurgery

## 2022-12-17 ENCOUNTER — Encounter: Payer: PRIVATE HEALTH INSURANCE | Admitting: Physical Therapy

## 2022-12-17 ENCOUNTER — Encounter: Payer: Self-pay | Admitting: Neurosurgery

## 2022-12-17 VITALS — BP 132/76 | Ht 61.0 in | Wt 216.0 lb

## 2022-12-17 DIAGNOSIS — M25551 Pain in right hip: Secondary | ICD-10-CM | POA: Diagnosis not present

## 2022-12-17 DIAGNOSIS — M4316 Spondylolisthesis, lumbar region: Secondary | ICD-10-CM | POA: Diagnosis not present

## 2022-12-17 DIAGNOSIS — M545 Low back pain, unspecified: Secondary | ICD-10-CM

## 2022-12-17 DIAGNOSIS — G8929 Other chronic pain: Secondary | ICD-10-CM

## 2022-12-17 NOTE — Progress Notes (Signed)
Referring Physician:  Alba Cory, MD [address] [place],  [place] [address]  Primary Physician:  Alba Cory, MD  History of Present Illness: 12/17/2022 Following up today, unfortunately her pain continues to be quite bothersome.  She has right-sided hip pain as well as back pain sometimes radiates to her legs.  This has been going on for at least 15 or 16 years.  Worsening over the past year.  When we saw her last time we planned for referral to pain team for possible right sided hip injections as she does have symptoms quite consistent with trochanteric bursitis.  We have also referred her to physical therapy which she was not able to work in.  I have utilized the care everywhere function in epic to review the outside records available from external health systems.  Review of Systems:  Neurologic review of systems negative unless otherwise specified above  Past Medical History: Past Medical History:  Diagnosis Date   Anemia    Anxiety    Arthritis    hands, hips   Asthma    Bilateral leg cramps    Depression    Diabetes mellitus without complication (HCC)    history no longer a problem since weight loss surgery   DJD (degenerative joint disease)    Dyspnea on exertion    a. 07/2019 Echo: EF 60-65%, no rwma, nl RV fxn, triv MR.   GERD (gastroesophageal reflux disease)    Headache    Migraines   IBS (irritable bowel syndrome)    Kidney stone    Low serum vitamin B12    Lung nodules    Migraine    down to approx 4x/mo since starting meds   Muscle spasm    Nonobstructive CAD (coronary artery disease)    a. 06/2014 ETT: Ex time 8:59, max HR 155, 10.1 METS, no ST/T changes; b. 08/2019 Cor CTA: Ca2+ 175 (99th %'ile), LAD 35-49p, LCX 0-25m-->Med rx.   Psoriasis    vaginal area   Seasonal allergies    Sleep apnea    history no longer a problem since weight loss surgery   Vasovagal syncope    [place], Dr. Ples Specter    Vitamin D deficiency      Past Surgical History: Past Surgical History:  Procedure Laterality Date   BLADDER SURGERY  2010   BREAST BIOPSY Right 2014   stereotatic biopsy   CHOLECYSTECTOMY  2010   COLONOSCOPY  2014    Done at [facility]   COLONOSCOPY WITH PROPOFOL N/A 01/28/2018   Procedure: COLONOSCOPY WITH PROPOFOL;  Surgeon: Pasty Spillers, MD;  Location: [facility];  Service: Endoscopy;  Laterality: N/A;   DILITATION & CURRETTAGE/HYSTROSCOPY WITH NOVASURE ABLATION N/A 06/16/2015   Procedure: DILATATION & CURETTAGE/HYSTEROSCOPY WITH NOVASURE ABLATION;  Surgeon: Olivia Mackie, MD;  Location: [facility];  Service: Gynecology;  Laterality: N/A;   ESOPHAGOGASTRODUODENOSCOPY (EGD) WITH PROPOFOL N/A 01/28/2018   Procedure: ESOPHAGOGASTRODUODENOSCOPY (EGD) WITH BIOPSIES;  Surgeon: Pasty Spillers, MD;  Location: [facility];  Service: Endoscopy;  Laterality: N/A;   GASTRIC ROUX-EN-Y N/A 11/03/2019   Procedure: LAPAROSCOPIC 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 WITH UPPER ENDOSCOPY;  Surgeon: Luretha Murphy, MD;  Location: [facility];  Service: General;  Laterality: N/A;   HIATAL HERNIA REPAIR N/A 11/03/2019   Procedure: HERNIA REPAIR HIATAL;  Surgeon: Luretha Murphy, MD;  Location: [facility];  Service: General;  Laterality: N/A;   LAPAROSCOPIC GASTRIC SLEEVE RESECTION  2012  LAPAROSCOPY N/A 06/16/2015   Procedure: LAPAROSCOPY DIAGNOSTIC;  Surgeon: Olivia Mackie, MD;  Location: [facility];  Service: Gynecology;  Laterality: N/A;   LAPAROSCOPY N/A 04/01/2018   Procedure: LAPAROSCOPY DIAGNOSTIC ERAS PATHWAY ENTEROLYSIS, CECOPEXY;  Surgeon: Luretha Murphy, MD;  Location: [facility];  Service: General;  Laterality: N/A;   LITHOTRIPSY  12/11/2017   laser lithotripsy   LYSIS OF ADHESION N/A 06/16/2015   Procedure: LYSIS OF ADHESION;  Surgeon: Olivia Mackie, MD;  Location: [facility];  Service: Gynecology;  Laterality: N/A;   PLANTAR FASCIA SURGERY Bilateral    ROBOTIC ASSISTED  SALPINGO OOPHERECTOMY Right 06/16/2015   Procedure: ROBOTIC ASSISTED SALPINGO OOPHORECTOMY, EXCISION OF RIGHT CUL DE SAC MASS;  Surgeon: Olivia Mackie, MD;  Location: [facility];  Service: Gynecology;  Laterality: Right;   TONSILLECTOMY     UPPER GI ENDOSCOPY      Allergies: Allergies as of 12/17/2022 - Review Complete 12/17/2022  Allergen Reaction Noted   Aspartame and phenylalanine Nausea Only and Other (See Comments) 06/02/2019   Gabapentin  05/16/2016   Glucose Nausea Only and Nausea And Vomiting 05/21/2019   Linzess [linaclotide]  11/11/2015   Other Hives and Other (See Comments) 06/30/2012   Triamcinolone Other (See Comments) 07/10/2021   Cymbalta [duloxetine hcl] Palpitations 09/24/2014   Tape Rash 12/11/2017   Tapentadol Rash 01/27/2018    Medications:  Current Outpatient Medications:    Accu-Chek FastClix Lancets MISC, , Disp: , Rfl:    ACCU-CHEK GUIDE test strip, , Disp: , Rfl:    albuterol (VENTOLIN HFA) 108 (90 Base) MCG/ACT inhaler, TAKE 2 PUFFS EVERY 6 HOURS AS NEEDED FOR WHEEZING OR SHORTNESS OF BREATH. (VENTOLIN NOT COVERED), Disp: 8.5 each, Rfl: 2   aspirin EC 81 MG tablet, Take 1 tablet (81 mg total) by mouth daily. Swallow whole., Disp: 90 tablet, Rfl: 3   atorvastatin (LIPITOR) 40 MG tablet, Take 1 tablet (40 mg total) by mouth daily., Disp: 90 tablet, Rfl: 3   budesonide-formoterol (SYMBICORT) 160-4.5 MCG/ACT inhaler, INHALE 2 PUFFS INTO THE LUNGS TWICE A DAY, Disp: 10.2 each, Rfl: 2   calcium citrate-vitamin D (CALCIUM CITRATE CHEWY BITE) 500-500 MG-UNIT chewable tablet, Chew 1 tablet by mouth 3 (three) times daily., Disp: , Rfl:    clobetasol cream (TEMOVATE) 0.05 %, SMARTSIG:Sparingly Topical Twice Daily, Disp: , Rfl:    clobetasol ointment (TEMOVATE) 0.05 %, Apply 1 application topically daily as needed (psoriasis). , Disp: , Rfl: 0   cyanocobalamin (VITAMIN B12) 1000 MCG/ML injection, Inject 1 mL (1,000 mcg total) into the muscle every 14 (fourteen) days., Disp:  6 mL, Rfl: 1   cyclobenzaprine (FLEXERIL) 10 MG tablet, Take 10 mg by mouth at bedtime as needed for muscle spasms., Disp: , Rfl:    fluconazole (DIFLUCAN) 150 MG tablet, Take 1 tablet PO once. Repeat in 3 days if needed., Disp: 2 tablet, Rfl: 0   fluocinonide (LIDEX) 0.05 % external solution, Apply 1 application topically 2 (two) times daily as needed (psoriasis). , Disp: , Rfl:    IRON-VITAMIN C PO, Take 1 tablet by mouth daily., Disp: , Rfl:    ketoconazole (NIZORAL) 2 % shampoo, Apply 1 application topically 2 (two) times daily as needed (psoriasis). , Disp: , Rfl:    Lactobacillus (ACIDOPHILUS PO), Take 1 capsule by mouth daily. Probiotic plus Calcium, Disp: , Rfl:    modafinil (PROVIGIL) 100 MG tablet, Take 1 tablet (100 mg total) by mouth daily., Disp: 90 tablet, Rfl: 0   mometasone (ELOCON) 0.1 % lotion, Apply 1  application topically daily as needed (psoriasis in the ears). , Disp: , Rfl: 4   mometasone (NASONEX) 50 MCG/ACT nasal spray, PLACE 2 SPRAYS INTO THE NOSE DAILY., Disp: 51 each, Rfl: 1   montelukast (SINGULAIR) 10 MG tablet, Take 1 tablet (10 mg total) by mouth at bedtime. TAKE 1 TABLET(10 MG) BY MOUTH AT BEDTIME, Disp: 90 tablet, Rfl: 3   Multiple Vitamins-Minerals (MULTIVITAMIN ADULT) CHEW, Chew 1 tablet by mouth daily., Disp: , Rfl:    nystatin (MYCOSTATIN/NYSTOP) powder, Apply 1 Application topically 3 (three) times daily., Disp: 15 g, Rfl: 0   ofloxacin (FLOXIN) 0.3 % OTIC solution, Place 5 drops into the right ear 2 (two) times daily., Disp: 10 mL, Rfl: 0   ondansetron (ZOFRAN) 4 MG tablet, Take 4 mg by mouth every 8 (eight) hours as needed., Disp: , Rfl:    pantoprazole (PROTONIX) 40 MG tablet, Take 40 mg by mouth daily., Disp: , Rfl:    pregabalin (LYRICA) 300 MG capsule, Take 1 capsule (300 mg total) by mouth 2 (two) times daily., Disp: 180 capsule, Rfl: 1   Semaglutide, 2 MG/DOSE, 8 MG/3ML SOPN, Inject 2 mg as directed once a week., Disp: 9 mL, Rfl: 1   traZODone  (DESYREL) 100 MG tablet, Take 2 tablets (200 mg total) by mouth at bedtime., Disp: 180 tablet, Rfl: 1   triamcinolone cream (KENALOG) 0.1 %, Apply 1 application topically 2 (two) times daily., Disp: 453.6 g, Rfl: 0   TRINTELLIX 20 MG TABS tablet, Take 1 tablet (20 mg total) by mouth daily., Disp: 90 tablet, Rfl: 1   Vitamin D, Ergocalciferol, (DRISDOL) 1.25 MG (50000 UNIT) CAPS capsule, Take 1 capsule (50,000 Units total) by mouth every Saturday., Disp: 12 capsule, Rfl: 1   zonisamide (ZONEGRAN) 50 MG capsule, Take 150 mg by mouth daily. , Disp: , Rfl: 2  Social History: Social History   Tobacco Use   Smoking status: Former    Current packs/day: 0.00    Average packs/day: 1 pack/day for 10.0 years (10.0 ttl pk-yrs)    Types: Cigarettes    Start date: 02/20/1983    Quit date: 02/19/1993    Years since quitting: 29.8   Smokeless tobacco: Never  Vaping Use   Vaping status: Never Used  Substance Use Topics   Alcohol use: Yes    Alcohol/week: 0.0 standard drinks of alcohol    Comment: occasionally   Drug use: No    Family Medical History: Family History  Problem Relation Age of Onset   Heart attack Mother    Stroke Mother    Multiple myeloma Mother    Hyperlipidemia Father    Lung cancer Father    Heart attack Sister    Lung cancer Maternal Grandfather    Breast cancer Paternal Grandmother    Heart attack Paternal Grandfather    Heart attack Maternal Uncle    Breast cancer Paternal Aunt     Physical Examination: Vitals:   12/17/22 1325  BP: 132/76    General: Patient is in no apparent distress. Attention to examination is appropriate.  Neck:   Supple.  Full range of motion.  Respiratory: Patient is breathing without any difficulty.  Deep palpation of trochanteric bursa is positive, FABER is positive, FABIR is equivocal.  NEUROLOGICAL:     Awake, alert, oriented to person, place, and time.  Speech is clear and fluent.   Cranial Nerves: Pupils equal round and  reactive to light.  Facial tone is symmetric.  Facial sensation is symmetric.  Shoulder shrug is symmetric. Tongue protrusion is midline.    Strength:  Side Iliopsoas Quads Hamstring PF DF EHL  R 5 5 5 5 5 5   L 5 5 5 5 5 5    Reflexes are 2+ and symmetric at the biceps, triceps, brachioradialis, patella and and 1+ achilles.   Bilateral upper and lower extremity sensation is intact to light touch    Gait is antalgic  Imaging: I was able to evaluate her x-rays which showed some loss of lumba lordosis.  There is also appears to be a grade 1 spondylolisthesis at L4-5.  No new imaging  I have personally reviewed the images and agree with the above interpretation.  Medical Decision Making/Assessment and Plan: Ms. Genco is a pleasant 52 y.o. female with a history of back and hip pain.  She presents today for further evaluation.  She states that she has been dealing with some back pain for approximately 15 years but has noticed a worsening exacerbation of her back and hip pain over the past few months.  When we saw her last we referred her for physical therapy as well as injections of her right hip.  Unfortunately has not been able to see anyone for her right hip injections.  Will plan on sending message to one of our referral team's.  She does have a grade 1 spondylolisthesis which has some component of movement, we plan on getting a MRI of her lumbar spine to evaluate for any compressive pathology.  She does have some possible radicular signs, however would like to have her hip evaluated prior to any lumbar based intervention.   Thank you for involving me in the care of this patient.    Lovenia Kim MD/MSCR Neurosurgery PT - discharged

## 2022-12-20 ENCOUNTER — Encounter: Payer: PRIVATE HEALTH INSURANCE | Admitting: Physical Therapy

## 2023-01-02 ENCOUNTER — Ambulatory Visit: Payer: PRIVATE HEALTH INSURANCE | Admitting: Neurosurgery

## 2023-01-04 ENCOUNTER — Ambulatory Visit
Admission: RE | Admit: 2023-01-04 | Discharge: 2023-01-04 | Disposition: A | Discharge status: Home / Self Care | Payer: PRIVATE HEALTH INSURANCE | Source: Ambulatory Visit | Attending: Neurosurgery | Admitting: Neurosurgery

## 2023-01-04 DIAGNOSIS — M4316 Spondylolisthesis, lumbar region: Secondary | ICD-10-CM | POA: Diagnosis present

## 2023-01-04 DIAGNOSIS — G8929 Other chronic pain: Secondary | ICD-10-CM | POA: Insufficient documentation

## 2023-01-04 DIAGNOSIS — M545 Low back pain, unspecified: Secondary | ICD-10-CM | POA: Insufficient documentation

## 2023-01-11 ENCOUNTER — Ambulatory Visit (INDEPENDENT_AMBULATORY_CARE_PROVIDER_SITE_OTHER): Payer: PRIVATE HEALTH INSURANCE

## 2023-01-11 DIAGNOSIS — E538 Deficiency of other specified B group vitamins: Secondary | ICD-10-CM

## 2023-01-11 MED ORDER — CYANOCOBALAMIN 1000 MCG/ML IJ SOLN
1000.0000 ug | Freq: Once | INTRAMUSCULAR | Status: AC
Start: 2023-01-11 — End: 2023-01-11
  Administered 2023-01-11: 1000 ug via INTRAMUSCULAR

## 2023-01-12 LAB — CBC WITH DIFFERENTIAL/PLATELET
Absolute Lymphocytes: 1537 {cells}/uL (ref 850–3900)
Absolute Monocytes: 698 {cells}/uL (ref 200–950)
Basophils Absolute: 44 {cells}/uL (ref 0–200)
Basophils Relative: 0.4 %
Eosinophils Absolute: 109 {cells}/uL (ref 15–500)
Eosinophils Relative: 1 %
HCT: 46.4 % — ABNORMAL HIGH (ref 35.0–45.0)
Hemoglobin: 15.6 g/dL — ABNORMAL HIGH (ref 11.7–15.5)
MCH: 30.9 pg (ref 27.0–33.0)
MCHC: 33.6 g/dL (ref 32.0–36.0)
MCV: 91.9 fL (ref 80.0–100.0)
MPV: 11.6 fL (ref 7.5–12.5)
Monocytes Relative: 6.4 %
Neutro Abs: 8513 {cells}/uL — ABNORMAL HIGH (ref 1500–7800)
Neutrophils Relative %: 78.1 %
Platelets: 267 10*3/uL (ref 140–400)
RBC: 5.05 10*6/uL (ref 3.80–5.10)
RDW: 12.5 % (ref 11.0–15.0)
Total Lymphocyte: 14.1 %
WBC: 10.9 10*3/uL — ABNORMAL HIGH (ref 3.8–10.8)

## 2023-01-12 LAB — LIPID PANEL
Cholesterol: 160 mg/dL (ref <200)
HDL: 73 mg/dL (ref >=50)
LDL Cholesterol (Calc): 71 mg/dL
Non-HDL Cholesterol (Calc): 87 mg/dL (ref <130)
Total CHOL/HDL Ratio: 2.2 (calc) (ref <5.0)
Triglycerides: 80 mg/dL (ref <150)

## 2023-01-12 LAB — COMPLETE METABOLIC PANEL WITH GFR
AG Ratio: 1.3 (calc) (ref 1.0–2.5)
ALT: 77 U/L — ABNORMAL HIGH (ref 6–29)
AST: 62 U/L — ABNORMAL HIGH (ref 10–35)
Albumin: 4 g/dL (ref 3.6–5.1)
Alkaline phosphatase (APISO): 89 U/L (ref 37–153)
BUN: 14 mg/dL (ref 7–25)
CO2: 25 mmol/L (ref 20–32)
Calcium: 9.7 mg/dL (ref 8.6–10.4)
Chloride: 101 mmol/L (ref 98–110)
Creat: 0.68 mg/dL (ref 0.50–1.03)
Globulin: 3.1 g/dL (ref 1.9–3.7)
Glucose, Bld: 132 mg/dL — ABNORMAL HIGH (ref 65–99)
Potassium: 4 mmol/L (ref 3.5–5.3)
Sodium: 135 mmol/L (ref 135–146)
Total Bilirubin: 0.8 mg/dL (ref 0.2–1.2)
Total Protein: 7.1 g/dL (ref 6.1–8.1)
eGFR: 106 mL/min/{1.73_m2} (ref >=60)

## 2023-01-12 LAB — B12 AND FOLATE PANEL
Folate: 11.8 ng/mL
Vitamin B-12: 226 pg/mL (ref 200–1100)

## 2023-01-12 LAB — VITAMIN D 25 HYDROXY (VIT D DEFICIENCY, FRACTURES): Vit D, 25-Hydroxy: 31 ng/mL (ref 30–100)

## 2023-01-12 LAB — MICROALBUMIN / CREATININE URINE RATIO
Creatinine, Urine: 153 mg/dL (ref 20–275)
Microalb Creat Ratio: 10 mg/g{creat} (ref <30)
Microalb, Ur: 1.6 mg/dL

## 2023-01-16 ENCOUNTER — Ambulatory Visit: Payer: PRIVATE HEALTH INSURANCE | Admitting: Family Medicine

## 2023-01-21 ENCOUNTER — Encounter: Payer: Self-pay | Admitting: Neurosurgery

## 2023-01-21 NOTE — Telephone Encounter (Signed)
I am not seeing the reads yet, I am able to look at the films but have not seen the actual dictation of a read

## 2023-01-25 ENCOUNTER — Ambulatory Visit (INDEPENDENT_AMBULATORY_CARE_PROVIDER_SITE_OTHER): Payer: PRIVATE HEALTH INSURANCE | Admitting: Neurosurgery

## 2023-01-25 DIAGNOSIS — M4316 Spondylolisthesis, lumbar region: Secondary | ICD-10-CM | POA: Insufficient documentation

## 2023-01-25 DIAGNOSIS — M545 Low back pain, unspecified: Secondary | ICD-10-CM

## 2023-01-25 NOTE — Progress Notes (Signed)
I had a follow-up phone call with ADILYNNE GAUMER today.  She was in a private setting, and I was in the office.  She gave consent to go forward with a phone visit.  We discussed her lumbar spondylolisthesis  Since she is seeing me she has been working with physical therapy and pain team.  She most recently saw the pain team who is planning on lumbar epidural spinal injections which we do feel may be of benefit for her.  She is also seen neurology who brought up consideration for possible small fiber neuropathy.  She states that she continues to have significant back pain.  She has not had any significant improvement.  No red flag symptoms at this time.  Her MRI demonstrated spondylosis at L4-5 most considerably with facet arthropathy and grade 1 slip with some low-grade edema within the bones.  Overall review of her case is that she has a component of lumbar spondylosis with symptomatic changes in her back causing her to have positional dependent back pain.  She has tried physical therapy, she is continuing to work with the pain team to optimize her less invasive interventions.  Would like to continue to follow along with her, would like to follow-up with her again in another 2 to 3 months.  Will reach out to the pain team and to see whether or not there considering medial bundle branch blocks for the facet arthropathy at L4-5.  Will also message her primary care provider to see whether or not she is having any plans for workup for possible small fiber neuropathy as mentioned in the neurology note.  I spent 12 minutes on the phone with the patient today.  Lovenia Kim, MD

## 2023-01-26 ENCOUNTER — Other Ambulatory Visit: Payer: Self-pay | Admitting: Family Medicine

## 2023-01-26 DIAGNOSIS — E538 Deficiency of other specified B group vitamins: Secondary | ICD-10-CM

## 2023-03-16 ENCOUNTER — Other Ambulatory Visit: Payer: Self-pay | Admitting: Family Medicine

## 2023-03-16 DIAGNOSIS — J302 Other seasonal allergic rhinitis: Secondary | ICD-10-CM

## 2023-03-19 ENCOUNTER — Ambulatory Visit: Payer: PRIVATE HEALTH INSURANCE | Admitting: Speech Pathology

## 2023-03-21 ENCOUNTER — Ambulatory Visit: Payer: PRIVATE HEALTH INSURANCE | Attending: Neurology | Admitting: Speech Pathology

## 2023-03-21 ENCOUNTER — Other Ambulatory Visit: Payer: Self-pay | Admitting: Medical Genetics

## 2023-03-21 DIAGNOSIS — R41841 Cognitive communication deficit: Secondary | ICD-10-CM | POA: Insufficient documentation

## 2023-03-21 NOTE — Therapy (Signed)
OUTPATIENT SPEECH LANGUAGE PATHOLOGY  COGNITION EVALUATION ONLY   Patient Name: Claire [name] MRN: [id number] DOB:1970/10/31, 53 y.o., female Today's Date: 03/21/2023  PCP: Alba Cory, MD REFERRING PROVIDER: Cristopher Peru, MD   End of Session - 03/21/23 1106     Visit Number 1    SLP Start Time 0930    SLP Stop Time  1015    SLP Time Calculation (min) 45 min    Activity Tolerance Patient tolerated treatment well             Past Medical History:  Diagnosis Date   Anemia    Anxiety    Arthritis    hands, hips   Asthma    Bilateral leg cramps    Depression    Diabetes mellitus without complication (HCC)    history no longer a problem since weight loss surgery   DJD (degenerative joint disease)    Dyspnea on exertion    a. 07/2019 Echo: EF 60-65%, no rwma, nl RV fxn, triv MR.   GERD (gastroesophageal reflux disease)    Headache    Migraines   IBS (irritable bowel syndrome)    Kidney stone    Low serum vitamin B12    Lung nodules    Migraine    down to approx 4x/mo since starting meds   Muscle spasm    Nonobstructive CAD (coronary artery disease)    a. 06/2014 ETT: Ex time 8:59, max HR 155, 10.1 METS, no ST/T changes; b. 08/2019 Cor CTA: Ca2+ 175 (99th %'ile), LAD 35-49p, LCX 0-63m-->Med rx.   Psoriasis    vaginal area   Seasonal allergies    Sleep apnea    history no longer a problem since weight loss surgery   Vasovagal syncope    [place], Dr. Ples Specter    Vitamin D deficiency    Past Surgical History:  Procedure Laterality Date   BLADDER SURGERY  2010   BREAST BIOPSY Right 2014   stereotatic biopsy   CHOLECYSTECTOMY  2010   COLONOSCOPY  2014    Done at [facility]   COLONOSCOPY WITH PROPOFOL N/A 01/28/2018   Procedure: COLONOSCOPY WITH PROPOFOL;  Surgeon: Pasty Spillers, MD;  Location: [facility];  Service: Endoscopy;  Laterality: N/A;   DILITATION & CURRETTAGE/HYSTROSCOPY WITH NOVASURE ABLATION N/A 06/16/2015   Procedure: DILATATION &  CURETTAGE/HYSTEROSCOPY WITH NOVASURE ABLATION;  Surgeon: Olivia Mackie, MD;  Location: [facility];  Service: Gynecology;  Laterality: N/A;   ESOPHAGOGASTRODUODENOSCOPY (EGD) WITH PROPOFOL N/A 01/28/2018   Procedure: ESOPHAGOGASTRODUODENOSCOPY (EGD) WITH BIOPSIES;  Surgeon: Pasty Spillers, MD;  Location: [facility];  Service: Endoscopy;  Laterality: N/A;   GASTRIC ROUX-EN-Y N/A 11/03/2019   Procedure: LAPAROSCOPIC 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 WITH UPPER ENDOSCOPY;  Surgeon: Luretha Murphy, MD;  Location: [facility];  Service: General;  Laterality: N/A;   HIATAL HERNIA REPAIR N/A 11/03/2019   Procedure: HERNIA REPAIR HIATAL;  Surgeon: Luretha Murphy, MD;  Location: [facility];  Service: General;  Laterality: N/A;   LAPAROSCOPIC GASTRIC SLEEVE RESECTION  2012   LAPAROSCOPY N/A 06/16/2015   Procedure: LAPAROSCOPY DIAGNOSTIC;  Surgeon: Olivia Mackie, MD;  Location: [facility];  Service: Gynecology;  Laterality: N/A;   LAPAROSCOPY N/A 04/01/2018   Procedure: LAPAROSCOPY DIAGNOSTIC ERAS PATHWAY ENTEROLYSIS, CECOPEXY;  Surgeon: Luretha Murphy, MD;  Location: [facility];  Service: General;  Laterality: N/A;   LITHOTRIPSY  12/11/2017   laser lithotripsy   LYSIS OF ADHESION N/A 06/16/2015   Procedure: LYSIS OF  ADHESION;  Surgeon: Olivia Mackie, MD;  Location: [facility];  Service: Gynecology;  Laterality: N/A;   PLANTAR FASCIA SURGERY Bilateral    ROBOTIC ASSISTED SALPINGO OOPHERECTOMY Right 06/16/2015   Procedure: ROBOTIC ASSISTED SALPINGO OOPHORECTOMY, EXCISION OF RIGHT CUL DE SAC MASS;  Surgeon: Olivia Mackie, MD;  Location: [facility];  Service: Gynecology;  Laterality: Right;   TONSILLECTOMY     UPPER GI ENDOSCOPY     Patient Active Problem List   Diagnosis Date Noted   Spondylolisthesis of lumbar region 01/25/2023   Morbid obesity with BMI of 40.0-44.9, adult (HCC) 07/10/2021   ADD (attention deficit disorder) 07/10/2021   Type 2 diabetes mellitus with obesity (HCC)  07/10/2021   History of thyroiditis 07/10/2021   Fibromyalgia 07/10/2021   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/03/2019   B12 deficiency 04/10/2018   Columnar epithelial-lined lower esophagus    Postprocedural disorder of digestive system    Esophageal dysphagia    Persistent mood (affective) disorder, unspecified (HCC) 01/15/2018   Insomnia related to another mental disorder 01/15/2018   OCD (obsessive compulsive disorder) 10/29/2017   GAD (generalized anxiety disorder) 10/29/2017   Nephrolithiasis 10/22/2017   Atherosclerosis of aorta (HCC) 12/21/2016   Hiatal hernia 08/17/2016   Diverticulosis of colon 08/17/2016   Lumbosacral pain 01/07/2015   Intertrigo 11/05/2014   Perennial allergic rhinitis 09/03/2014   Lung nodules 09/03/2014   Vitamin D deficiency 09/03/2014   History of kidney stones 09/03/2014   History of iron deficiency anemia 09/03/2014   Mild persistent asthma without complication 11/21/2012   GERD without esophagitis 11/21/2012   History of hyperlipidemia 11/21/2012   Migraine with aura and without status migrainosus 11/21/2012   History of sleep apnea 11/21/2012   IBS (irritable bowel syndrome) 08/15/2012    ONSET DATE: 2023; date of referral 01/23/2023  REFERRING DIAG: G31.84 (ICD-10-CM) - Mild cognitive impairment   THERAPY DIAG:  Cognitive communication deficit  Rationale for Evaluation and Treatment Rehabilitation  SUBJECTIVE:   SUBJECTIVE STATEMENT: Pt pleasant, good historian Pt accompanied by: self  PERTINENT HISTORY and DIAGNOSTIC FINDINGS:: Pt is a 53 year old female with past medical history of migraines, history of B12 deficiency, tinnitus, restless leg syndrome, concern for vasovagal syncope, fibromyalgia as well as back pain. Also concern for Mild cognitive impairment in patient with SLUMS Score of 21/30 (Difficulty, paraphasic errors)  09/04/2021.   Routine EEG 07/08/2021 - This is an awake and drowsy abnormal EEG due to frequent paroxysmal generalized delta slowing lasting for 2-4 seconds without preceding sharp/spike activity noted.   Prolonged EEG 09/22/2021 - unremarkable   Mild cognitive impairment in patient with Slums Score of 21/30 (Difficulty, paraphasic errors) SLUMS 09/04/2021 - 21/30    PAIN:  Are you having pain?  Managed throughout session   FALLS: Has patient fallen in last 6 months?  No  LIVING ENVIRONMENT: Lives with: lives with their family Lives in: House/apartment  PLOF:  Level of assistance: Independent with ADLs, Independent with IADLs Employment: Part-time employment   PATIENT GOALS   to assess cognitive communication abilities  OBJECTIVE:   COGNITIVE COMMUNICATION Overall cognitive status: Within functional limits for tasks assessed Functional Impairments: see clinical impression statement  AUDITORY COMPREHENSION  Overall auditory comprehension: Appears intact YES/NO questions: Appears intact Following directions: Appears intact Conversation: Simple  READING COMPREHENSION: Intact  EXPRESSION: verbal  VERBAL EXPRESSION:   Overall verbal expression: Appears intact Level  of generative/spontaneous verbalization: conversation Automatic speech: name: intact and social response: intact  Repetition: Appears intact Pragmatics: Appears intact Non-verbal means of communication: N/A  WRITTEN EXPRESSION: Dominant hand: right Written expression: Appears intact  ORAL MOTOR EXAMINATION Facial : WFL Lingual: WFL Velum: WFL Mandible: WFL Cough: WFL Voice: WFL  MOTOR SPEECH: Overall motor speech: Appears intact Respiration: diaphragmatic/abdominal breathing Phonation: normal Resonance: WFL Articulation: Appears intact Intelligibility: Intelligible Motor planning: Appears intact  STANDARDIZED ASSESSMENTS: Addenbrooke's Cognitive Examination - ACE III The Adden[place]oke's  Cognitive Examination-III (ACE-III) is a brief cognitive test that assesses five cognitive domains. The total score is 100 with higher scores indicating better cognitive functioning. Cut off scores of 88 and 82 are recommended for suspicion of dementia (88 has sensitivity of 1.00 and specificity of 0.96, 82 has sensitivity of 0.93 and specificity of 1.00). American Version A  Attention 18/18  Memory 22/26  Fluency 8/14  Language 26/26  Visuospatial 16/16  TOTAL ACE- III Score 90/100      PATIENT EDUCATION: Education details: results of this evaluation, pt with functional  Person educated: Patient Education method: Explanation Education comprehension: verbalized understanding   HOME EXERCISE PROGRAM:   Continue using external memory aides    ASSESSMENT:  CLINICAL IMPRESSION: Patient is a 53 y.o. female who was seen today for a cognitive communication assessment d/t concerns for mild cognitive impairment. Pt's performance within the assessment as well as on the standard cognitive performance testing, indicate functional cognitive communication abilities.   Pt reports the following functional deficits losing train of thought, intermittent word finding difficulty, memory and brain fog. Subjectively pt was observed using external aides with great ability to support these deficits. Would recommended and discussed with pt referral to neuropsychology for further evaluation and assessment.   PLAN: Will defer ST services until completion of neuropsychological assessment.   Natalie Leclaire B. Dreama Saa, M.S., CCC-SLP, Tree surgeon Certified Brain Injury Specialist [facility]  [facility] Rehabilitation Services Office [phone] Ascom [phone] Fax [phone]

## 2023-03-26 ENCOUNTER — Ambulatory Visit: Payer: PRIVATE HEALTH INSURANCE | Admitting: Speech Pathology

## 2023-03-26 ENCOUNTER — Other Ambulatory Visit
Admission: RE | Admit: 2023-03-26 | Discharge: 2023-03-26 | Disposition: A | Discharge status: Home / Self Care | Payer: Self-pay | Source: Ambulatory Visit | Attending: Oncology | Admitting: Oncology

## 2023-03-28 ENCOUNTER — Ambulatory Visit: Payer: PRIVATE HEALTH INSURANCE | Admitting: Speech Pathology

## 2023-04-02 ENCOUNTER — Ambulatory Visit: Payer: PRIVATE HEALTH INSURANCE | Admitting: Speech Pathology

## 2023-04-04 ENCOUNTER — Ambulatory Visit: Payer: PRIVATE HEALTH INSURANCE | Admitting: Speech Pathology

## 2023-04-09 ENCOUNTER — Ambulatory Visit: Payer: PRIVATE HEALTH INSURANCE | Admitting: Speech Pathology

## 2023-04-11 ENCOUNTER — Ambulatory Visit: Payer: PRIVATE HEALTH INSURANCE | Admitting: Speech Pathology

## 2023-04-16 ENCOUNTER — Ambulatory Visit: Payer: PRIVATE HEALTH INSURANCE | Admitting: Speech Pathology

## 2023-04-17 LAB — GENECONNECT MOLECULAR SCREEN: Genetic Analysis Overall Interpretation: NEGATIVE

## 2023-04-18 ENCOUNTER — Ambulatory Visit: Payer: PRIVATE HEALTH INSURANCE | Admitting: Speech Pathology

## 2023-04-23 ENCOUNTER — Ambulatory Visit: Payer: PRIVATE HEALTH INSURANCE | Admitting: Speech Pathology

## 2023-04-25 ENCOUNTER — Ambulatory Visit: Payer: PRIVATE HEALTH INSURANCE | Admitting: Speech Pathology

## 2023-04-30 ENCOUNTER — Ambulatory Visit: Payer: PRIVATE HEALTH INSURANCE | Admitting: Speech Pathology

## 2023-05-02 ENCOUNTER — Ambulatory Visit: Payer: PRIVATE HEALTH INSURANCE | Admitting: Speech Pathology

## 2023-05-06 ENCOUNTER — Ambulatory Visit: Payer: PRIVATE HEALTH INSURANCE | Admitting: Family Medicine

## 2023-05-06 ENCOUNTER — Other Ambulatory Visit: Payer: Self-pay | Admitting: Family Medicine

## 2023-05-06 ENCOUNTER — Encounter: Payer: Self-pay | Admitting: Family Medicine

## 2023-05-06 VITALS — BP 126/74 | HR 89 | Resp 16 | Ht 61.0 in | Wt 219.1 lb

## 2023-05-06 DIAGNOSIS — B372 Candidiasis of skin and nail: Secondary | ICD-10-CM

## 2023-05-06 DIAGNOSIS — I700 Atherosclerosis of aorta: Secondary | ICD-10-CM

## 2023-05-06 DIAGNOSIS — M797 Fibromyalgia: Secondary | ICD-10-CM | POA: Diagnosis not present

## 2023-05-06 DIAGNOSIS — F5104 Psychophysiologic insomnia: Secondary | ICD-10-CM | POA: Diagnosis not present

## 2023-05-06 DIAGNOSIS — F349 Persistent mood [affective] disorder, unspecified: Secondary | ICD-10-CM

## 2023-05-06 DIAGNOSIS — E1159 Type 2 diabetes mellitus with other circulatory complications: Secondary | ICD-10-CM

## 2023-05-06 DIAGNOSIS — G43109 Migraine with aura, not intractable, without status migrainosus: Secondary | ICD-10-CM

## 2023-05-06 DIAGNOSIS — Z9884 Bariatric surgery status: Secondary | ICD-10-CM

## 2023-05-06 DIAGNOSIS — Z1211 Encounter for screening for malignant neoplasm of colon: Secondary | ICD-10-CM

## 2023-05-06 DIAGNOSIS — E538 Deficiency of other specified B group vitamins: Secondary | ICD-10-CM

## 2023-05-06 DIAGNOSIS — J4530 Mild persistent asthma, uncomplicated: Secondary | ICD-10-CM

## 2023-05-06 LAB — POCT GLYCOSYLATED HEMOGLOBIN (HGB A1C): Hemoglobin A1C: 5.6 % (ref 4.0–5.6)

## 2023-05-06 MED ORDER — PREGABALIN 300 MG PO CAPS
300.0000 mg | ORAL_CAPSULE | Freq: Two times a day (BID) | ORAL | 1 refills | Status: DC
Start: 2023-05-06 — End: 2023-11-06

## 2023-05-06 MED ORDER — TRINTELLIX 20 MG PO TABS: 20.0000 mg | ORAL_TABLET | Freq: Every day | ORAL | 1 refills | Status: DC

## 2023-05-06 MED ORDER — FLUCONAZOLE 150 MG PO TABS: 150.0000 mg | ORAL_TABLET | ORAL | 0 refills | Status: DC

## 2023-05-06 MED ORDER — NYSTATIN 100000 UNIT/GM EX POWD: 1.0000 | Freq: Three times a day (TID) | CUTANEOUS | 0 refills | Status: AC

## 2023-05-06 MED ORDER — KETOCONAZOLE 2 % EX CREA: 1.0000 | TOPICAL_CREAM | Freq: Every day | CUTANEOUS | 1 refills | Status: AC

## 2023-05-06 MED ORDER — TRAZODONE HCL 100 MG PO TABS
200.0000 mg | ORAL_TABLET | Freq: Every day | ORAL | 1 refills | Status: DC
Start: 2023-05-06 — End: 2023-11-06

## 2023-05-06 MED ORDER — BUDESONIDE-FORMOTEROL FUMARATE 160-4.5 MCG/ACT IN AERO: INHALATION_SPRAY | RESPIRATORY_TRACT | 2 refills | Status: DC

## 2023-05-06 NOTE — Progress Notes (Signed)
 Name: Claire [name]   MRN: [id number]    DOB: [birth year]   Date:05/06/2023       Progress Note  Subjective  Chief Complaint  Chief Complaint  Patient presents with   Medical Management of Chronic Issues   HPI   Persistent Mood Affective Disorder : she was seeing Dr. Maryruth Bun but no longer covered by insurance   She is used to take zoloft but has been on  Trintelix 20 mg for a long time now. She has insomnia and failed Lunesta, Ambien, Trazodone , Temazepam.  Dr. Maryruth Bun was given Adderall but had to be stopped due to alcohol consumption. Her mood is about the same, she is feeling better now that she has a new job as Consulting civil engineer . She is cutting down on alcohol intake    Asthma Mild Intermittentt: doing well at this time, she takes symbicort prn Unchanged    Chronic back pain with right radiculitis: seen by neurosurgeon had MRI that showed DDD and low grade marrow edema but no neural compression, she was sent to [facility] and had steroid injections without help followed by nerve block, it helped with pain on left side but pain on right side is not any better    Migraine: she is still seeing Dr. Neale Burly - she is on  Zonegran and also gets triggers point injections prn. 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  Maximum weight of 265 lbs and was down to 118.3 lbs - lowest weight back in 08/2017), she was gaining weight since she started to feel better after umbilical hernia repair with  lysis of adhesions and cecopexy back in 04/01/2018, she had a bariatric surgery revision 11/03/2019 by Dr. Daphine Deutscher, at that time weight was 196 lb, and it went down to 184 lbs and is now gradually gaining it back, , stable since last visit around 215 lbs. She is back on Ozempic  due to spike on A1C . She is under the care of  Dr Gershon Crane recommended going up on dose of Ozempic 2 mg weekly but  unable to tolerate higher dose. She states causes headaches and nausea.  She had a cardiac calcium score and diagnosed with CAD no angina. Last LDL was down to 71    Elevated LFT's: she was drinking heavily from 2023 through end of 2024. She is gradually cutting down and is now drinking beer, two to three a week. Last level was normal   IBS/GERD history of bypass surgery:  She was doing well after  lysis of adhesions and cecopexy back in 04/01/2018. She has noticed watery diarrhea for the past couple of weeks. She states daughter is also having diarrhea.    Atherosclerosis of aorta and hyperlipidemia: on statin therapy. No side effects LDL , last LDL down to 71.   B12 deficiency: she has been getting B12 injections bi-monthly .    Thyroiditis: she is under the care of Dr. Gershon Crane , last level was at goal    Dizziness/syncope: seen at [facility], and has seen Dr. Christie Beckers 03/2019 , they ruled out POTS but may have had vasovagal syncope. She is doing better, seeing cardiologist now Currently seeing Dr. Sherryll Burger at [facility], had multiple tests done From Dr. Keith Rake note: coronary CT 9/24 shows mild nonobstructive LAD stenosis.  No significant change from prior.  Echo 9/24 with  normal EF 60 to 65%.  Continue aspirin 81 mg daily, Lipitor 40 mg daily.     FMS: she has been more compliant with Lyrica  now at 300 mg BID , discussed possible side effects of weight gain, but she states it helps with pain level. Pain level today is 6/10.   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/03/2019   B12 deficiency 04/10/2018   Columnar epithelial-lined lower esophagus    Postprocedural disorder of digestive system    Esophageal dysphagia    Persistent mood (affective) disorder, unspecified (HCC) 01/15/2018   Insomnia related to another mental disorder 01/15/2018   OCD (obsessive compulsive disorder) 10/29/2017   GAD (generalized anxiety disorder) 10/29/2017   Nephrolithiasis 10/22/2017   Atherosclerosis of aorta (HCC) 12/21/2016   Hiatal hernia 08/17/2016   Diverticulosis of colon 08/17/2016   Lumbosacral pain 01/07/2015   Intertrigo 11/05/2014   Perennial allergic rhinitis 09/03/2014   Lung nodules 09/03/2014   Vitamin D deficiency 09/03/2014   History of kidney stones 09/03/2014   History of iron deficiency anemia 09/03/2014   Mild persistent asthma without complication 11/21/2012   GERD without esophagitis 11/21/2012   History of hyperlipidemia 11/21/2012   Migraine with aura and without status migrainosus 11/21/2012   History of sleep apnea 11/21/2012   IBS (irritable bowel syndrome) 08/15/2012    Past Surgical History:  Procedure Laterality Date   BLADDER SURGERY  2010   BREAST BIOPSY Right 2014   stereotatic biopsy   CHOLECYSTECTOMY  2010   COLONOSCOPY  2014    Done at [facility]   COLONOSCOPY WITH PROPOFOL N/A 01/28/2018   Procedure: COLONOSCOPY WITH PROPOFOL;  Surgeon: Pasty Spillers, MD;  Location: [facility];  Service: Endoscopy;  Laterality: N/A;   DILITATION & CURRETTAGE/HYSTROSCOPY WITH NOVASURE ABLATION N/A 06/16/2015   Procedure: DILATATION & CURETTAGE/HYSTEROSCOPY WITH NOVASURE ABLATION;  Surgeon: Olivia Mackie, MD;  Location: [facility];  Service: Gynecology;  Laterality: N/A;   ESOPHAGOGASTRODUODENOSCOPY (EGD) WITH PROPOFOL N/A 01/28/2018   Procedure: ESOPHAGOGASTRODUODENOSCOPY (EGD) WITH BIOPSIES;   Surgeon: Pasty Spillers, MD;  Location: [facility];  Service: Endoscopy;  Laterality: N/A;   GASTRIC ROUX-EN-Y N/A 11/03/2019   Procedure: LAPAROSCOPIC 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 WITH UPPER ENDOSCOPY;  Surgeon: Luretha Murphy, MD;  Location: [facility];  Service: General;  Laterality: N/A;   HIATAL HERNIA REPAIR N/A 11/03/2019   Procedure: HERNIA REPAIR HIATAL;  Surgeon: Luretha Murphy, MD;  Location: [facility];  Service: General;  Laterality: N/A;   LAPAROSCOPIC GASTRIC SLEEVE RESECTION  2012   LAPAROSCOPY N/A 06/16/2015   Procedure: LAPAROSCOPY DIAGNOSTIC;  Surgeon: Olivia Mackie, MD;  Location: [facility];  Service: Gynecology;  Laterality: N/A;   LAPAROSCOPY N/A 04/01/2018   Procedure: LAPAROSCOPY DIAGNOSTIC ERAS PATHWAY ENTEROLYSIS, CECOPEXY;  Surgeon: Luretha Murphy, MD;  Location: [facility];  Service: General;  Laterality: N/A;   LITHOTRIPSY  12/11/2017   laser lithotripsy  LYSIS OF ADHESION N/A 06/16/2015   Procedure: LYSIS OF ADHESION;  Surgeon: Olivia Mackie, MD;  Location: [facility];  Service: Gynecology;  Laterality: N/A;   PLANTAR FASCIA SURGERY Bilateral    ROBOTIC ASSISTED SALPINGO OOPHERECTOMY Right 06/16/2015   Procedure: ROBOTIC ASSISTED SALPINGO OOPHORECTOMY, EXCISION OF RIGHT CUL DE SAC MASS;  Surgeon: Olivia Mackie, MD;  Location: [facility];  Service: Gynecology;  Laterality: Right;   TONSILLECTOMY     UPPER GI ENDOSCOPY      Family History  Problem Relation Age of Onset   Heart attack Mother    Stroke Mother    Multiple myeloma Mother    Hyperlipidemia Father    Lung cancer Father    Heart attack Sister    Lung cancer Maternal Grandfather    Breast cancer Paternal Grandmother    Heart attack Paternal Grandfather    Heart attack Maternal Uncle    Breast cancer Paternal Aunt     Social History   Tobacco Use   Smoking status: Former    Current packs/day: 0.00    Average packs/day: 1 pack/day for 10.0 years (10.0 ttl  pk-yrs)    Types: Cigarettes    Start date: 02/20/1983    Quit date: 02/19/1993    Years since quitting: 30.2   Smokeless tobacco: Never  Substance Use Topics   Alcohol use: Yes    Alcohol/week: 0.0 standard drinks of alcohol    Comment: occasionally     Current Outpatient Medications:    aspirin EC 81 MG tablet, Take 1 tablet (81 mg total) by mouth daily. Swallow whole., Disp: 90 tablet, Rfl: 3   calcium citrate-vitamin D (CALCIUM CITRATE CHEWY BITE) 500-500 MG-UNIT chewable tablet, Chew 1 tablet by mouth 3 (three) times daily., Disp: , Rfl:    clobetasol cream (TEMOVATE) 0.05 %, SMARTSIG:Sparingly Topical Twice Daily, Disp: , Rfl:    cyanocobalamin (VITAMIN B12) 1000 MCG/ML injection, INJECT 1 ML INTO THE MUSCLE EVERY 14 DAYS., Disp: 6 mL, Rfl: 1   cyclobenzaprine (FLEXERIL) 10 MG tablet, Take 10 mg by mouth at bedtime as needed for muscle spasms., Disp: , Rfl:    IRON-VITAMIN C PO, Take 1 tablet by mouth daily., Disp: , Rfl:    ketoconazole (NIZORAL) 2 % cream, Apply 1 Application topically daily., Disp: 120 g, Rfl: 1   ketoconazole (NIZORAL) 2 % shampoo, Apply 1 application topically 2 (two) times daily as needed (psoriasis). , Disp: , Rfl:    Lactobacillus (ACIDOPHILUS PO), Take 1 capsule by mouth daily. 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 , Disp: , Rfl: 4   mometasone (NASONEX) 50 MCG/ACT nasal spray, PLACE 2 SPRAYS INTO THE NOSE DAILY., Disp: 51 each, Rfl: 1   montelukast (SINGULAIR) 10 MG tablet, Take 1 tablet (10 mg total) by mouth at bedtime. TAKE 1 TABLET(10 MG) BY MOUTH AT BEDTIME, Disp: 90 tablet, Rfl: 3   Multiple Vitamins-Minerals (MULTIVITAMIN ADULT) CHEW, Chew 1 tablet by mouth daily., Disp: , Rfl:    ondansetron (ZOFRAN) 4 MG tablet, Take 4 mg by mouth every 8 (eight) hours as needed., Disp: , Rfl:    pantoprazole (PROTONIX) 40 MG tablet, Take 40 mg by mouth daily.,  Disp: , Rfl:    Semaglutide, 2 MG/DOSE, 8 MG/3ML SOPN, Inject 2 mg as directed once a week., Disp: 9 mL, Rfl: 1   triamcinolone cream (KENALOG) 0.1 %, Apply 1 application topically  2 (two) times daily., Disp: 453.6 g, Rfl: 0   Vitamin D, Ergocalciferol, (DRISDOL) 1.25 MG (50000 UNIT) CAPS capsule, Take 1 capsule (50,000 Units total) by mouth every Saturday., Disp: 12 capsule, Rfl: 1   zonisamide (ZONEGRAN) 50 MG capsule, Take 150 mg by mouth daily. , Disp: , Rfl: 2   atorvastatin (LIPITOR) 40 MG tablet, Take 1 tablet (40 mg total) by mouth daily., Disp: 90 tablet, Rfl: 3   budesonide-formoterol (SYMBICORT) 160-4.5 MCG/ACT inhaler, INHALE 2 PUFFS INTO THE LUNGS TWICE A DAY, Disp: 10.2 each, Rfl: 2   fluconazole (DIFLUCAN) 150 MG tablet, Take 1 tablet (150 mg total) by mouth every other day. Take 1 tablet PO once. Repeat in 3 days if needed., Disp: 3 tablet, Rfl: 0   nystatin (MYCOSTATIN/NYSTOP) powder, Apply 1 Application topically 3 (three) times daily., Disp: 60 g, Rfl: 0   pregabalin (LYRICA) 300 MG capsule, Take 1 capsule (300 mg total) by mouth 2 (two) times daily., Disp: 180 capsule, Rfl: 1   traZODone (DESYREL) 100 MG tablet, Take 2 tablets (200 mg total) by mouth at bedtime., Disp: 180 tablet, Rfl: 1   TRINTELLIX 20 MG TABS tablet, Take 1 tablet (20 mg total) by mouth daily., Disp: 90 tablet, Rfl: 1  Allergies  Allergen Reactions   Aspartame And Phenylalanine Nausea Only and Other (See Comments)    GI upset    Gabapentin     Bladder incontinence at higher dose   Glucose Nausea Only and Nausea And Vomiting   Linzess [Linaclotide]     Worsening of diarrhea   Other Hives and Other (See Comments)    Ricotta Cheese: flushed and hot   Triamcinolone Other (See Comments)   Cymbalta [Duloxetine Hcl] Palpitations    And photosensitivity   Tape Rash    Some clear tapes   Tapentadol Rash    Some clear tapes    I personally reviewed active problem list, medication list, allergies, family  history with the patient/caregiver today.   ROS  Ten systems reviewed and is negative except as mentioned in HPI    Objective  Vitals:   05/06/23 1412 05/06/23 1505  BP: 138/80 126/74  Pulse: 89   Resp: 16   SpO2: 97%   Weight: 219 lb 1.6 oz (99.4 kg)   Height: 5\' 1"  (1.549 m)     Body mass index is 41.4 kg/m.  Physical Exam  Constitutional: Patient appears well-developed and well-nourished. Obese  No distress.  HEENT: head atraumatic, normocephalic, pupils equal and reactive to light, neck supple Cardiovascular: Normal rate, regular rhythm and normal heart sounds.  No murmur heard. No BLE edema. Pulmonary/Chest: Effort normal and breath sounds normal. No respiratory distress. Abdominal: Soft.  There is no tenderness. Skin: erythematous rash on groin with satellite lesions Psychiatric: Patient has a normal mood and affect. behavior is normal. Judgment and thought content normal.   Recent Results (from the past 2160 hours)  Gene[facility] Molecular Screen - Blood ([facility])     Status: None   Collection Time: 03/26/23  1:15 PM  Result Value Ref Range   Genetic Analysis Overall Interpretation Negative    Genetic Disease Assessed      [facility] Tier One Population Screen is a screening test that analyzes 11 genes related to hereditary breast and ovarian cancer (HBOC) syndrome, Lynch syndrome, and familial hypercholesterolemia. This test only reports clinically significant pathogenic and  likely pathogenic variants but does not report variants of uncertain significance (VUS). In addition, analysis  of the PMS2 gene excludes exons 11-15, which overlap with a known pseudogene (PMS2CL).    Genetic Analysis Report      No pathogenic or likely pathogenic variants were detected in the genes analyzed by this test.Genetic test results should be interpreted in the context of an individual's personal medical and family history. Alteration to medical management is NOT  recommended  based solely on this result. Clinical correlation is advised.Additional Considerations- This is a screening test; individuals may still carry pathogenic or likely pathogenic variant(s) in the tested genes that are not detected by this test.-  For individuals at risk for these or other related conditions based on factors including personal or family history, diagnostic testing is recommended.- The absence of pathogenic or likely pathogenic variant(s) in the analyzed genes, while reassuring,  does not eliminate the possibility of a hereditary condition; there are other variants and genes associated with heart disease and hereditary cancer that are not included in this test.    Genes Tested See Notes     Comment: APOB, BRCA1, BRCA2, EPCAM, LDLR, LDLRAP1, PCSK9, PMS2, MLH1, MSH2, MSH6   Disclaimer See Notes     Comment: This test was developed and validated by [facility] [employer] This test has not been cleared or approved by the [place] (FDA). The [facility] laboratory is accredited by the College of American Pathologists (CAP) and certified under  the Clinical Laboratory Improvement Amendments (CLIA #: [id number]) to perform high-complexity clinical tests. This test is used for clinical purposes. It should not be regarded as investigational or for research.    Sequencing Location See Notes     Comment: Sequencing done at [facility] [address], [address], [place], [place] [address] (CLIA# [id number])   Interpretation Methods and Limitations See Notes     Comment: Extracted DNA is enriched for targeted regions and then sequenced using the [facility] Exome+ (R) assay on an Illumina DNA sequencing system. Data is then aligned to a modified version of GRCh38 and all genes are analyzed using the MANE transcript and MANE  Plus Clinical transcript, when available. Small variant calling is completed using a customized version of [employer]'s DNAseq software, augmented by a proprietary  small variant caller for difficult variants. Copy number variants (CNVs) are then called  using a proprietary bioinformatics pipeline based on depth analysis with a comparison to similarly sequenced samples. Analysis of the PMS2 gene is limited to exons 1-10. The interpretation and reporting of variants in APOB, PCSK9, and LDLR is specific to  familial hypercholesterolemia; variants associated with hypobetalipoproteinemia are not included. Interpretation is based upon guidelines published by the [employer] and [employer]) and the Association for  Molecular  Pathology (AMP) or their modification by [employer] when available. Interpretation is limited to the transcripts indicated on the report and +/- 10 bp into intronic regions, except as noted below. [facility] variant classifications  include pathogenic, likely pathogenic, variant of uncertain significance (VUS), likely benign, and benign. Only variants classified as pathogenic and likely pathogenic are included in the report. All reported variants are confirmed through secondary  manual inspection of DNA sequence data or orthogonal testing. Risk estimations and management guidelines included in this report are based on analysis of primary literature and recommendations of applicable professional societies, and should be regarded  as approximations.Based on validation studies, this assay delivers > 99% sensitivity and specificity for single nucleotide variants and insertions and deletions (indels) up to  20 bp. Larger indels and complex variants are a lso reported but sensitivity  may be reduced. Based on validation studies, this assay delivers > 99% sensitivity to multi-exon CNVs and > 90% sensitivity to single-exon CNVs. This test may not detect variants in challenging regions (such as short tandem repeats, homopolymer runs, and  segment duplications), sub-exonic CNVs, chromosomal aneuploidy, or variants  in the presence of mosaicism. Phasing will be attempted and reported, when possible. Structural rearrangements such as inversions, translocations, and gene conversions are not  tested in this assay unless explicitly indicated. Additionally, deep intronic, promoter, and enhancer regions may not be covered. It is important to note that this is a screening test and cannot detect all disease-causing variants. A negative result does  not guarantee the absence of a rare, undetectable variant in the genes analyzed; consider using a diagnostic test if there is significant personal and/or family history of one of the conditions analyze d by this test. Any potential incidental findings  outside of these genes and conditions will not be identified, nor reported. The results of a genetic test may be influenced by various factors, including bone marrow transplantation, blood transfusions, or in rare cases, hematolymphoid neoplasms.Gene  Specific Notes:APOB: analysis is limited to c.10580G>A and c.10579C>T; BRCA1: sequencing analysis extends to CDS +/-20 bp; BRCA2: sequencing analysis extends to CDS +/-20 bp. EPCAM: analysis is limited to CNV of exons 8-9; LDLR: analysis includes CNV of  the promoter; MLH1: analysis includes CNV of the promoter; PMS2: analysis is limited to exons 1-10.Gardenia Phlegm, PhD, [email or web address]   POCT glycosylated hemoglobin (Hb A1C)     Status: None   Collection Time: 05/06/23  2:16 PM  Result Value Ref Range   Hemoglobin A1C 5.6 4.0 - 5.6 %   HbA1c POC (<> result, manual entry)     HbA1c, POC (prediabetic range)     HbA1c, POC (controlled diabetic range)      Diabetic Foot Exam:     PHQ2/9:    05/06/2023    2:10 PM 12/03/2022   12:01 PM 11/23/2022    9:05 AM 09/14/2022    2:57 PM 07/09/2022    8:40 AM  Depression screen PHQ 2/9  Decreased Interest 0 0 0 0 0  Down, Depressed, Hopeless 0 0 0 0 0  PHQ - 2 Score 0 0 0 0 0  Altered sleeping 0 0  0 0  Tired,  decreased energy 0 0  0 0  Change in appetite 0 0  0 0  Feeling bad or failure about yourself  0 0  0 0  Trouble concentrating 0 0  0 0  Moving slowly or fidgety/restless 0 0  0 0  Suicidal thoughts 0 0  0 0  PHQ-9 Score 0 0  0 0  Difficult doing work/chores Not difficult at all   Not difficult at all     phq 9 is negative  Fall Risk:    11/23/2022    9:05 AM 09/14/2022    2:57 PM 07/09/2022    8:40 AM 02/07/2022    8:09 AM 01/08/2022   11:38 AM  Fall Risk   Falls in the past year? 1 0 0 0 0  Number falls in past yr: 0 0  0 0  Injury with Fall? 0 0  0 0  Risk for fall due to : History of fall(s);No Fall Risks  No Fall Risks No Fall Risks No Fall Risks  Follow up Falls prevention discussed  Falls  prevention discussed Education provided;Falls evaluation completed;Falls prevention discussed Falls prevention discussed     Assessment & Plan  1. Type 2 diabetes mellitus with cardiac complication (HCC) (Primary)  - POCT glycosylated hemoglobin (Hb A1C) Under the care of cardiologist, high CT calcium score, also has associated dyslipidemia  2. Atherosclerosis of aorta (HCC)  On statin therapy   3. Persistent mood (affective) disorder, unspecified (HCC)  - TRINTELLIX 20 MG TABS tablet; Take 1 tablet (20 mg total) by mouth daily.  Dispense: 90 tablet; Refill: 1  4. Chronic insomnia  - traZODone (DESYREL) 100 MG tablet; Take 2 tablets (200 mg total) by mouth at bedtime.  Dispense: 180 tablet; Refill: 1  5. Fibromyalgia  - pregabalin (LYRICA) 300 MG capsule; Take 1 capsule (300 mg total) by mouth 2 (two) times daily.  Dispense: 180 capsule; Refill: 1  6. Mild persistent asthma without complication  - budesonide-formoterol (SYMBICORT) 160-4.5 MCG/ACT inhaler; INHALE 2 PUFFS INTO THE LUNGS TWICE A DAY  Dispense: 10.2 each; Refill: 2  7. History of bariatric surgery  Failed , on GLP one now  8. Migraine with aura and without status migrainosus, not intractable  Keep visits  with neurologist   9. Screening for colon cancer  - Ambulatory referral to Gastroenterology  10. B12 deficiency  States not getting injections every two weeks, but will try to have it done again  11. Persistent mood (affective) disorder, unspecified (HCC)  - TRINTELLIX 20 MG TABS tablet; Take 1 tablet (20 mg total) by mouth daily.  Dispense: 90 tablet; Refill: 1  12. Monilial intertrigo  - nystatin (MYCOSTATIN/NYSTOP) powder; Apply 1 Application topically 3 (three) times daily.  Dispense: 60 g; Refill: 0 - fluconazole (DIFLUCAN) 150 MG tablet; Take 1 tablet (150 mg total) by mouth every other day. Take 1 tablet PO once. Repeat in 3 days if needed.  Dispense: 3 tablet; Refill: 0 - ketoconazole (NIZORAL) 2 % cream; Apply 1 Application topically daily.  Dispense: 120 g; Refill: 1

## 2023-05-07 ENCOUNTER — Ambulatory Visit: Payer: PRIVATE HEALTH INSURANCE | Admitting: Speech Pathology

## 2023-05-09 ENCOUNTER — Ambulatory Visit: Payer: PRIVATE HEALTH INSURANCE | Admitting: Speech Pathology

## 2023-05-09 ENCOUNTER — Encounter: Payer: Self-pay | Admitting: Family Medicine

## 2023-05-14 ENCOUNTER — Ambulatory Visit: Payer: PRIVATE HEALTH INSURANCE | Admitting: Speech Pathology

## 2023-05-16 ENCOUNTER — Ambulatory Visit: Payer: PRIVATE HEALTH INSURANCE | Admitting: Speech Pathology

## 2023-05-18 ENCOUNTER — Other Ambulatory Visit: Payer: Self-pay | Admitting: Family Medicine

## 2023-05-21 ENCOUNTER — Ambulatory Visit: Payer: PRIVATE HEALTH INSURANCE | Admitting: Speech Pathology

## 2023-05-23 ENCOUNTER — Ambulatory Visit: Payer: PRIVATE HEALTH INSURANCE | Admitting: Speech Pathology

## 2023-05-28 ENCOUNTER — Ambulatory Visit: Payer: PRIVATE HEALTH INSURANCE | Admitting: Speech Pathology

## 2023-05-30 ENCOUNTER — Ambulatory Visit: Payer: PRIVATE HEALTH INSURANCE | Admitting: Speech Pathology

## 2023-05-31 ENCOUNTER — Other Ambulatory Visit: Payer: Self-pay | Admitting: Family Medicine

## 2023-05-31 ENCOUNTER — Encounter (HOSPITAL_COMMUNITY): Payer: Self-pay | Admitting: *Deleted

## 2023-05-31 DIAGNOSIS — M25511 Pain in right shoulder: Secondary | ICD-10-CM

## 2023-06-01 ENCOUNTER — Ambulatory Visit
Admission: RE | Admit: 2023-06-01 | Discharge: 2023-06-01 | Disposition: A | Discharge status: Home / Self Care | Payer: PRIVATE HEALTH INSURANCE | Source: Ambulatory Visit | Attending: Family Medicine | Admitting: Family Medicine

## 2023-06-01 DIAGNOSIS — M25511 Pain in right shoulder: Secondary | ICD-10-CM | POA: Diagnosis present

## 2023-06-04 ENCOUNTER — Ambulatory Visit: Payer: PRIVATE HEALTH INSURANCE | Admitting: Speech Pathology

## 2023-06-06 ENCOUNTER — Ambulatory Visit: Payer: PRIVATE HEALTH INSURANCE | Admitting: Speech Pathology

## 2023-06-11 ENCOUNTER — Ambulatory Visit: Payer: PRIVATE HEALTH INSURANCE | Admitting: Speech Pathology

## 2023-06-13 ENCOUNTER — Ambulatory Visit: Payer: PRIVATE HEALTH INSURANCE | Admitting: Speech Pathology

## 2023-06-14 ENCOUNTER — Other Ambulatory Visit: Payer: Self-pay | Admitting: Family Medicine

## 2023-06-14 DIAGNOSIS — J4530 Mild persistent asthma, uncomplicated: Secondary | ICD-10-CM

## 2023-06-17 ENCOUNTER — Telehealth: Payer: Self-pay | Admitting: Family Medicine

## 2023-06-17 NOTE — Telephone Encounter (Signed)
 Ozempic  1mg  4mg /30ml  Key: [id number]

## 2023-06-17 NOTE — Telephone Encounter (Signed)
Therapy changed.

## 2023-06-18 ENCOUNTER — Ambulatory Visit: Payer: PRIVATE HEALTH INSURANCE | Admitting: Speech Pathology

## 2023-06-20 ENCOUNTER — Ambulatory Visit: Payer: PRIVATE HEALTH INSURANCE | Admitting: Speech Pathology

## 2023-06-24 ENCOUNTER — Ambulatory Visit: Payer: PRIVATE HEALTH INSURANCE | Attending: Sports Medicine

## 2023-06-24 DIAGNOSIS — M25511 Pain in right shoulder: Secondary | ICD-10-CM | POA: Diagnosis present

## 2023-06-24 DIAGNOSIS — M5412 Radiculopathy, cervical region: Secondary | ICD-10-CM | POA: Diagnosis present

## 2023-06-24 NOTE — Therapy (Signed)
 OUTPATIENT PHYSICAL THERAPY EVALUATION   Patient Name: Claire [name] MRN: [id number] DOB:[birth year], 53 y.o., female Today's Date: 06/24/2023  END OF SESSION:  PT End of Session - 06/24/23 1350     Visit Number 1    Number of Visits 17    Date for PT Re-Evaluation 08/23/23    PT Start Time 1350    PT Stop Time 1500    PT Time Calculation (min) 70 min    Activity Tolerance Patient tolerated treatment well;Patient limited by pain    Behavior During Therapy [facility] for tasks assessed/performed             Past Medical History:  Diagnosis Date   Anemia    Anxiety    Arthritis    hands, hips   Asthma    Bilateral leg cramps    Depression    Diabetes mellitus without complication (HCC)    history no longer a problem since weight loss surgery   DJD (degenerative joint disease)    Dyspnea on exertion    a. 07/2019 Echo: EF 60-65%, no rwma, nl RV fxn, triv MR.   GERD (gastroesophageal reflux disease)    Headache    Migraines   IBS (irritable bowel syndrome)    Kidney stone    Low serum vitamin B12    Lung nodules    Migraine    down to approx 4x/mo since starting meds   Muscle spasm    Nonobstructive CAD (coronary artery disease)    a. 06/2014 ETT: Ex time 8:59, max HR 155, 10.1 METS, no ST/T changes; b. 08/2019 Cor CTA: Ca2+ 175 (99th %'ile), LAD 35-49p, LCX 0-44m-->Med rx.   Psoriasis    vaginal area   Seasonal allergies    Sleep apnea    history no longer a problem since weight loss surgery   Vasovagal syncope    [place], [name]    Vitamin D  deficiency    Past Surgical History:  Procedure Laterality Date   BLADDER SURGERY  2010   BREAST BIOPSY Right 2014   stereotatic biopsy   CHOLECYSTECTOMY  2010   COLONOSCOPY  2014    Done at [facility] [place]   COLONOSCOPY WITH PROPOFOL  N/A 01/28/2018   Procedure: COLONOSCOPY WITH PROPOFOL ;  Surgeon: Irby Mannan, MD;  Location: [facility];  Service: Endoscopy;  Laterality: N/A;   DILITATION &  CURRETTAGE/HYSTROSCOPY WITH NOVASURE ABLATION N/A 06/16/2015   Procedure: DILATATION & CURETTAGE/HYSTEROSCOPY WITH NOVASURE ABLATION;  Surgeon: Meriam Stamp, MD;  Location: [facility];  Service: Gynecology;  Laterality: N/A;   ESOPHAGOGASTRODUODENOSCOPY (EGD) WITH PROPOFOL  N/A 01/28/2018   Procedure: ESOPHAGOGASTRODUODENOSCOPY (EGD) WITH BIOPSIES;  Surgeon: Irby Mannan, MD;  Location: [facility];  Service: Endoscopy;  Laterality: N/A;   GASTRIC ROUX-EN-Y N/A 11/03/2019   Procedure: LAPAROSCOPIC 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 WITH UPPER ENDOSCOPY;  Surgeon: Jacolyn Matar, MD;  Location: [facility];  Service: General;  Laterality: N/A;   HIATAL HERNIA REPAIR N/A 11/03/2019   Procedure: HERNIA REPAIR HIATAL;  Surgeon: Jacolyn Matar, MD;  Location: [facility];  Service: General;  Laterality: N/A;   LAPAROSCOPIC GASTRIC SLEEVE RESECTION  2012   LAPAROSCOPY N/A 06/16/2015   Procedure: LAPAROSCOPY DIAGNOSTIC;  Surgeon: Meriam Stamp, MD;  Location: [facility];  Service: Gynecology;  Laterality: N/A;   LAPAROSCOPY N/A 04/01/2018   Procedure: LAPAROSCOPY DIAGNOSTIC ERAS PATHWAY ENTEROLYSIS, CECOPEXY;  Surgeon: Jacolyn Matar, MD;  Location: [facility];  Service: General;  Laterality: N/A;   LITHOTRIPSY  12/11/2017   laser lithotripsy   LYSIS OF ADHESION N/A 06/16/2015   Procedure: LYSIS OF ADHESION;  Surgeon: Meriam Stamp, MD;  Location: [facility];  Service: Gynecology;  Laterality: N/A;   PLANTAR FASCIA SURGERY Bilateral    ROBOTIC ASSISTED SALPINGO OOPHERECTOMY Right 06/16/2015   Procedure: ROBOTIC ASSISTED SALPINGO OOPHORECTOMY, EXCISION OF RIGHT CUL DE SAC MASS;  Surgeon: Meriam Stamp, MD;  Location: [facility];  Service: Gynecology;  Laterality: Right;   TONSILLECTOMY     UPPER GI ENDOSCOPY     Patient Active Problem List   Diagnosis Date Noted   Spondylolisthesis of lumbar region 01/25/2023   Morbid obesity with BMI of 40.0-44.9, adult (HCC) 07/10/2021   ADD  (attention deficit disorder) 07/10/2021   Type 2 diabetes mellitus with obesity (HCC) 07/10/2021   History of thyroiditis 07/10/2021   Fibromyalgia 07/10/2021   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/03/2019   B12 deficiency 04/10/2018   Columnar epithelial-lined lower esophagus    Postprocedural disorder of digestive system    Esophageal dysphagia    Persistent mood (affective) disorder, unspecified (HCC) 01/15/2018   Insomnia related to another mental disorder 01/15/2018   OCD (obsessive compulsive disorder) 10/29/2017   GAD (generalized anxiety disorder) 10/29/2017   Nephrolithiasis 10/22/2017   Atherosclerosis of aorta (HCC) 12/21/2016   Hiatal hernia 08/17/2016   Diverticulosis of colon 08/17/2016   Lumbosacral pain 01/07/2015   Intertrigo 11/05/2014   Perennial allergic rhinitis 09/03/2014   Lung nodules 09/03/2014   Vitamin D  deficiency 09/03/2014   History of kidney stones 09/03/2014   History of iron deficiency anemia 09/03/2014   Mild persistent asthma without complication 11/21/2012   GERD without esophagitis 11/21/2012   History of hyperlipidemia 11/21/2012   Migraine with aura and without status migrainosus 11/21/2012   History of sleep apnea 11/21/2012   IBS (irritable bowel syndrome) 08/15/2012    PCP: Arleen Lacer, MD   REFERRING PROVIDER: Saddie Crane, DO  REFERRING DIAG: M25.511 (ICD-10-CM) - Pain in right shoulder G89.29 (ICD-10-CM) - Other chronic pain M75.41 (ICD-10-CM) - Impingement syndrome of right shoulder  THERAPY DIAG:  Right shoulder pain, unspecified chronicity - Plan: PT plan of care cert/re-cert  Radiculopathy, cervical region - Plan: PT plan of care cert/re-cert  Rationale for Evaluation and Treatment: Rehabilitation  ONSET DATE: February 2025  SUBJECTIVE:  SUBJECTIVE STATEMENT: 10/10 at worst.   Hand dominance: Right  PERTINENT HISTORY:  R shoulder pain. Was placed in a sling which made her pain worse. Has to use her R UE a lot. 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 Pt tried to lift up the window to open it for a client and heard a pop or a snap in her shoulder. Pain then radiated to her R arm to her elbow. Pt also states that she has fibromyalgia and gets referred pain. Got a steroid injection last Tuesday 06/18/2023 which helped her R shoulder but still has pain in her R arm. Was a 10/10 prior to the cortisone shot. Has 2 tears in her shoulder which have not retracted.   April 19, 2023 pt caught her client and kept her from falling as the client was trying to stand up from a chair, and pt slid her back to her bed (to her R, pt was on the client's L side). Has a TENS unit which kind of helped. Also tried ice and heat.   Was told that if PT does not work then pt has to see a Careers adviser.   No latex allergies No blood pressure problems per pt.     PAIN:  Are you having pain? Yes: NPRS scale: 4/10 (resting position) Pain location: R shoulder, arm, and elbow Pain description: strong ache Aggravating factors: reaching up, donning and doffing clothes, reaching across, reaching behind her back.  Relieving factors: propping her R arm onto a pillow in slight flexion/abduction.   PRECAUTIONS: No known precautions  RED FLAGS: Bowel or bladder incontinence: No and Cauda equina syndrome: No     WEIGHT BEARING RESTRICTIONS: No  FALLS:  Has patient fallen in last 6 months? 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   PLOF: Independent  PATIENT GOALS: Try to prevent surgery and decrease pain.   NEXT MD VISIT: none yet  OBJECTIVE:  Note: Objective measures were completed at Evaluation unless otherwise noted.  DIAGNOSTIC FINDINGS:  MR SHOULDER RIGHT WO CONTRAST 06/01/2023  Narrative & Impression  CLINICAL DATA:  Right shoulder pain for 2.5 months.  Lifting injury.   EXAM: MRI OF THE RIGHT SHOULDER WITHOUT CONTRAST   TECHNIQUE: Multiplanar, multisequence MR imaging of the shoulder was performed. No intravenous contrast was administered.   COMPARISON:  None Available.   FINDINGS: Rotator cuff: There is a partial-thickness bursal sided tear of a mid AP dimension of the supraspinatus tendon measuring up to 4 mm in transverse dimension (coronal images 15 and 16) and 8 mm in AP dimension (sagittal image 7). The posterior aspect of this tear is smaller and located within the midsubstance of the tendon (coronal image 15). No significant tendon retraction. Mild-to-moderate underlying intermediate T2 signal posterior supraspinatus and anterior infraspinatus tendinosis. The subscapularis and teres minor are intact.   Muscles: No rotator cuff muscle atrophy, fatty infiltration, or edema.   Biceps long head: The intra-articular long head of the biceps tendon is intact.   Acromioclavicular Joint: There are mild degenerative changes of the acromioclavicular joint including joint space narrowing, subchondral marrow edema, and peripheral osteophytosis. Type I acromion. Trace fluid within the subacromial/subdeltoid bursa.  Glenohumeral Joint: Mild-to-moderate inferior anterior humeral head and mid glenoid cartilage thinning. No glenohumeral joint effusion.   Labrum: Grossly intact, but evaluation is limited by lack of intraarticular fluid.   Bones:  No acute fracture.    Other: None.   IMPRESSION: 1. Partial-thickness bursal sided tear of the mid AP dimension of the supraspinatus tendon measuring up to 4 mm in transverse dimension and 8 mm in AP dimension. No significant tendon retraction. Mild-to-moderate underlying posterior supraspinatus and anterior infraspinatus tendinosis. 2. Mild degenerative changes of the acromioclavicular joint. 3. Mild-to-moderate inferior anterior humeral head and mid glenoid cartilage thinning.     Electronically Signed   By: Bertina Broccoli M.D.   On: 06/09/2023 12:19       PATIENT SURVEYS:  Upper Extremity Functional Index (UEFI): 32/80  COGNITION: Overall cognitive status: Within functional limits for tasks assessed  SENSATION:   POSTURE: Forward neck, movement preference around C5/C6 area, B protracted shoulders, L lateral shift with L trunk rotation.  PALPATION: TTP R first rib area, AC joint, acromion process anterior and lateral TTP R anterior and anterior medial arm TTP R lateral arm    Muscle tension R upper trap.    CERVICAL ROM:   Active ROM A/PROM (deg) eval  Flexion WFL with R upper trap and anterior lateral shoulder and arm symptoms (worse than extension)  Extension Limited with R upper trap and anterior lateral shoulder and arm symptoms.   Right lateral flexion WFL with L lateral neck tenderness  Left lateral flexion WFL with R upper trap and anterior lateral shoulder and arm symptoms.  As well as R anterior arm symptoms. Followed by R ulnar nerve/C8 dermatome symptoms to R medial elbow  Right rotation limited  Left rotation Limited and R anterior shoulder symptoms.    (Blank rows = not tested)  UPPER EXTREMITY ROM:  Active ROM Right eval Left eval  Shoulder flexion 70 with pain (95 degrees AAROM with pain, empty end feel)   Shoulder extension    Shoulder abduction 62 with pain (83 degrees AAROM with pain, empty end feel)   Shoulder adduction    Shoulder extension    Shoulder  internal rotation (functional) R thumb to R piriformis with R anterior shoulder and arm pain   Shoulder external rotation (functional) Unable to reach head, has R anterior shoulder pain.    Elbow flexion    Elbow extension    Wrist flexion    Wrist extension    Wrist ulnar deviation    Wrist radial deviation    Wrist pronation    Wrist supination     (Blank rows = not tested)  UPPER EXTREMITY MMT:  MMT Right eval Left eval  Shoulder flexion 3-   Shoulder extension    Shoulder abduction 3-   Shoulder adduction    Shoulder extension    Shoulder internal rotation 4- with anterior shoulder pain   Shoulder external rotation 4   Middle trapezius    Lower trapezius (seated manually resisted) 4- 4-  Elbow flexion 4 with anterior shoulder and arm pain with R 4th and 5th digit paresthesia   Elbow extension (arm at side, 90 degrees elbow flexion) 4/5 with R anterior shoulder pain.    Wrist flexion    Wrist extension    Wrist ulnar deviation    Wrist radial deviation    Wrist pronation    Wrist supination    Grip strength     (Blank rows = not tested)  CERVICAL SPECIAL  TESTS:  No change in pain with R shoulder flexion and scapular retraction  Chin tuck increased R UE symptoms.   (+) empty can, Hawkin's kennedy test R shoulder    FUNCTIONAL TESTS:    TREATMENT DATE: 06/24/2023                                                                                                                               Manual Therapy Seated STM R upper trap and scalene area to decrease muscle tension  Seated STM R infraspinatus to decrease muscle tension   Slight decreased pain reported afterwards   PATIENT EDUCATION:  Education details: POC Person educated: Patient Education method: Explanation Education comprehension: verbalized understanding  HOME EXERCISE PROGRAM:   ASSESSMENT:  CLINICAL IMPRESSION: Patient is a 53 y.o. female who was seen today for physical therapy evaluation  and treatment for R shoulder pain. She also presents with altered posture, limited R shoulder AROM, irritability of symptoms, R UE weakness, cervical radiculopathy radiating to her R UE causing symptoms, positive special tests suggesting impingement and rotator cuff involvement, and difficulty performing tasks which involve reaching, lifting, carrying secondary to pain. Pt will benefit from skilled physical therapy services to address the aforementioned deficits.   OBJECTIVE IMPAIRMENTS: decreased ROM, decreased strength, impaired UE functional use, improper body mechanics, postural dysfunction, and pain.   ACTIVITY LIMITATIONS: carrying, lifting, toileting, dressing, self feeding, reach over head, and hygiene/grooming  PARTICIPATION LIMITATIONS:   PERSONAL FACTORS: Fitness, Past/current experiences, Time since onset of injury/illness/exacerbation, and 3+ comorbidities: anxiety, arthritis, asthma, depression, DM, DJD  are also affecting patient's functional outcome.   REHAB POTENTIAL: Fair    CLINICAL DECISION MAKING: Stable/uncomplicated  EVALUATION COMPLEXITY: Low   GOALS: Goals reviewed with patient? Yes  SHORT TERM GOALS: Target date: 07/05/2023  Pt will be independent with her initial HEP to decrease pain, improve ROM, strength, function, and ability to reach as well as don and doff clothes more comfortably.  Baseline:Pt has not yet started her initial HEP (06/24/2023)  Goal status: INITIAL  LONG TERM GOALS: Target date: 08/23/2023  Pt will have a decrease in R shoulder and arm pain to 5/10 or less at worst to promote ability to reach, don and doff clothes, lift, and carry more comfortably.  Baseline: 10/10 R shoulder and UE pain at worst (06/24/2023) Goal status: INITIAL  2.  Pt will improve her Upper Extremity Functional Index (UEFI) by at least 10 points as a demonstration of improved function.  Baseline: Upper Extremity Functional Index (UEFI): 32/80 (06/24/2023) Goal status:  INITIAL  3.  Pt will improve her R shoulder flexion and abduction AROM to at least 110 degrees to promote ability to reach with less difficulty.  Baseline:  Active ROM Right eval  Shoulder flexion 70 with pain (95 degrees AAROM with pain, empty end feel)  Shoulder extension   Shoulder abduction 62 with pain (83 degrees AAROM with pain, empty end feel)   (  06/24/2023)  Goal status: INITIAL  4.  Pt will improve her R shoulder functional IR to R thumb to T7 spinous process and functional R shoulder ER to 3rd digit to behind head to promote ability to don and doff clothes as well as perform self care/grooming more comfortably as well as with less difficulty  Baseline:  Active ROM Right eval  Shoulder internal rotation (functional) R thumb to R piriformis with R anterior shoulder and arm pain  Shoulder external rotation (functional) Unable to reach head, has R anterior shoulder pain.    (06/24/2023)  Goal status: INITIAL  5.  Pt will improve her R UE strength by at least 1/2 MMT to promote ability to use her R UE for functional tasks with less difficulty.  Baseline:  MMT Right eval  Shoulder flexion 3-  Shoulder extension   Shoulder abduction 3-  Shoulder adduction   Shoulder extension   Shoulder internal rotation 4- with anterior shoulder pain  Shoulder external rotation 4  Middle trapezius   Lower trapezius (seated manually resisted) 4-  Elbow flexion 4 with anterior shoulder and arm pain with R 4th and 5th digit paresthesia  Elbow extension (arm at side, 90 degrees elbow flexion) 4/5 with R anterior shoulder pain.    (06/24/2023)  Goal status: INITIAL     PLAN:  PT FREQUENCY: 1-2x/week  PT DURATION: 8 weeks  PLANNED INTERVENTIONS: 97110-Therapeutic exercises, 97530- Therapeutic activity, 97112- Neuromuscular re-education, 97535- Self Care, 21308- Manual therapy, [id number]- Electrical stimulation (unattended), [id number]- Traction (mechanical), 97033- Ionotophoresis 4mg /ml  Dexamethasone , Patient/Family education, Dry Needling, Joint mobilization, Joint manipulation, Spinal manipulation, and Spinal mobilization  PLAN FOR NEXT SESSION: posture, scapular strengthening, R shoulder ROM and strengthening, manual techniques, modalities PRN   Neola Worrall, PT, DPT 06/24/2023, 4:39 PM

## 2023-06-25 ENCOUNTER — Ambulatory Visit: Payer: PRIVATE HEALTH INSURANCE | Admitting: Speech Pathology

## 2023-06-27 ENCOUNTER — Ambulatory Visit: Payer: PRIVATE HEALTH INSURANCE | Admitting: Speech Pathology

## 2023-06-27 ENCOUNTER — Ambulatory Visit: Payer: PRIVATE HEALTH INSURANCE

## 2023-06-27 DIAGNOSIS — M25511 Pain in right shoulder: Secondary | ICD-10-CM

## 2023-06-27 DIAGNOSIS — M5412 Radiculopathy, cervical region: Secondary | ICD-10-CM

## 2023-06-27 NOTE — Therapy (Signed)
 OUTPATIENT PHYSICAL THERAPY TREATMENT  Patient Name: Claire [name] MRN: [id number] DOB:November 02, 1970, 53 y.o., female Today's Date: 06/27/2023  END OF SESSION:  PT End of Session - 06/27/23 0950     Visit Number 2    Number of Visits 17    Date for PT Re-Evaluation 08/23/23    PT Start Time 0950    PT Stop Time 1034    PT Time Calculation (min) 44 min    Activity Tolerance Patient tolerated treatment well;Patient limited by pain    Behavior During Therapy [facility] for tasks assessed/performed              Past Medical History:  Diagnosis Date   Anemia    Anxiety    Arthritis    hands, hips   Asthma    Bilateral leg cramps    Depression    Diabetes mellitus without complication (HCC)    history no longer a problem since weight loss surgery   DJD (degenerative joint disease)    Dyspnea on exertion    a. 07/2019 Echo: EF 60-65%, no rwma, nl RV fxn, triv MR.   GERD (gastroesophageal reflux disease)    Headache    Migraines   IBS (irritable bowel syndrome)    Kidney stone    Low serum vitamin B12    Lung nodules    Migraine    down to approx 4x/mo since starting meds   Muscle spasm    Nonobstructive CAD (coronary artery disease)    a. 06/2014 ETT: Ex time 8:59, max HR 155, 10.1 METS, no ST/T changes; b. 08/2019 Cor CTA: Ca2+ 175 (99th %'ile), LAD 35-49p, LCX 0-32m-->Med rx.   Psoriasis    vaginal area   Seasonal allergies    Sleep apnea    history no longer a problem since weight loss surgery   Vasovagal syncope    [name], [name]    Vitamin D  deficiency    Past Surgical History:  Procedure Laterality Date   BLADDER SURGERY  2010   BREAST BIOPSY Right 2014   stereotatic biopsy   CHOLECYSTECTOMY  2010   COLONOSCOPY  2014    Done at [facility]   COLONOSCOPY WITH PROPOFOL  N/A 01/28/2018   Procedure: COLONOSCOPY WITH PROPOFOL ;  Surgeon: Irby Mannan, MD;  Location: [facility];  Service: Endoscopy;  Laterality: N/A;   DILITATION &  CURRETTAGE/HYSTROSCOPY WITH NOVASURE ABLATION N/A 06/16/2015   Procedure: DILATATION & CURETTAGE/HYSTEROSCOPY WITH NOVASURE ABLATION;  Surgeon: Meriam Stamp, MD;  Location: [facility];  Service: Gynecology;  Laterality: N/A;   ESOPHAGOGASTRODUODENOSCOPY (EGD) WITH PROPOFOL  N/A 01/28/2018   Procedure: ESOPHAGOGASTRODUODENOSCOPY (EGD) WITH BIOPSIES;  Surgeon: Irby Mannan, MD;  Location: [facility];  Service: Endoscopy;  Laterality: N/A;   GASTRIC ROUX-EN-Y N/A 11/03/2019   Procedure: LAPAROSCOPIC 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 WITH UPPER ENDOSCOPY;  Surgeon: Jacolyn Matar, MD;  Location: [facility];  Service: General;  Laterality: N/A;   HIATAL HERNIA REPAIR N/A 11/03/2019   Procedure: HERNIA REPAIR HIATAL;  Surgeon: Jacolyn Matar, MD;  Location: [facility];  Service: General;  Laterality: N/A;   LAPAROSCOPIC GASTRIC SLEEVE RESECTION  2012   LAPAROSCOPY N/A 06/16/2015   Procedure: LAPAROSCOPY DIAGNOSTIC;  Surgeon: Meriam Stamp, MD;  Location: [facility];  Service: Gynecology;  Laterality: N/A;   LAPAROSCOPY N/A 04/01/2018   Procedure: LAPAROSCOPY DIAGNOSTIC ERAS PATHWAY ENTEROLYSIS, CECOPEXY;  Surgeon: Jacolyn Matar, MD;  Location: [facility];  Service: General;  Laterality: N/A;   LITHOTRIPSY  12/11/2017   laser lithotripsy   LYSIS OF ADHESION N/A 06/16/2015   Procedure: LYSIS OF ADHESION;  Surgeon: Meriam Stamp, MD;  Location: [facility];  Service: Gynecology;  Laterality: N/A;   PLANTAR FASCIA SURGERY Bilateral    ROBOTIC ASSISTED SALPINGO OOPHERECTOMY Right 06/16/2015   Procedure: ROBOTIC ASSISTED SALPINGO OOPHORECTOMY, EXCISION OF RIGHT CUL DE SAC MASS;  Surgeon: Meriam Stamp, MD;  Location: [facility];  Service: Gynecology;  Laterality: Right;   TONSILLECTOMY     UPPER GI ENDOSCOPY     Patient Active Problem List   Diagnosis Date Noted   Spondylolisthesis of lumbar region 01/25/2023   Morbid obesity with BMI of 40.0-44.9, adult (HCC) 07/10/2021   ADD  (attention deficit disorder) 07/10/2021   Type 2 diabetes mellitus with obesity (HCC) 07/10/2021   History of thyroiditis 07/10/2021   Fibromyalgia 07/10/2021   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/03/2019   B12 deficiency 04/10/2018   Columnar epithelial-lined lower esophagus    Postprocedural disorder of digestive system    Esophageal dysphagia    Persistent mood (affective) disorder, unspecified (HCC) 01/15/2018   Insomnia related to another mental disorder 01/15/2018   OCD (obsessive compulsive disorder) 10/29/2017   GAD (generalized anxiety disorder) 10/29/2017   Nephrolithiasis 10/22/2017   Atherosclerosis of aorta (HCC) 12/21/2016   Hiatal hernia 08/17/2016   Diverticulosis of colon 08/17/2016   Lumbosacral pain 01/07/2015   Intertrigo 11/05/2014   Perennial allergic rhinitis 09/03/2014   Lung nodules 09/03/2014   Vitamin D  deficiency 09/03/2014   History of kidney stones 09/03/2014   History of iron deficiency anemia 09/03/2014   Mild persistent asthma without complication 11/21/2012   GERD without esophagitis 11/21/2012   History of hyperlipidemia 11/21/2012   Migraine with aura and without status migrainosus 11/21/2012   History of sleep apnea 11/21/2012   IBS (irritable bowel syndrome) 08/15/2012    PCP: Arleen Lacer, MD   REFERRING PROVIDER: Saddie Crane, DO  REFERRING DIAG: M25.511 (ICD-10-CM) - Pain in right shoulder G89.29 (ICD-10-CM) - Other chronic pain M75.41 (ICD-10-CM) - Impingement syndrome of right shoulder  THERAPY DIAG:  Right shoulder pain, unspecified chronicity  Radiculopathy, cervical region  Rationale for Evaluation and Treatment: Rehabilitation  ONSET DATE: February 2025  SUBJECTIVE:  SUBJECTIVE STATEMENT: R shoulder is doing ok, just a little bit of pain currently, 3/10 currently, because it is early in the day. Did good after last session.   Hand dominance: Right  PERTINENT HISTORY:  R shoulder pain. Was placed in a sling which made her pain worse. Has to use her R UE a lot. 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 Pt tried to lift up the window to open it for a client and heard a pop or a snap in her shoulder. Pain then radiated to her R arm to her elbow. Pt also states that she has fibromyalgia and gets referred pain. Got a steroid injection last Tuesday 06/18/2023 which helped her R shoulder but still has pain in her R arm. Was a 10/10 prior to the cortisone shot. Has 2 tears in her shoulder which have not retracted.   April 19, 2023 pt caught her client and kept her from falling as the client was trying to stand up from a chair, and pt slid her back to her bed (to her R, pt was on the client's L side). Has a TENS unit which kind of helped. Also tried ice and heat.   Was told that if PT does not work then pt has to see a Careers adviser.   No latex allergies No blood pressure problems per pt.     PAIN:  Are you having pain? Yes: NPRS scale: 4/10 (resting position) Pain location: R shoulder, arm, and elbow Pain description: strong ache Aggravating factors: reaching up, donning and doffing clothes, reaching across, reaching behind her back.  Relieving factors: propping her R arm onto a pillow in slight flexion/abduction.   PRECAUTIONS: No known precautions  RED FLAGS: Bowel or bladder incontinence: No and Cauda equina syndrome: No     WEIGHT BEARING RESTRICTIONS: No  FALLS:  Has patient fallen in last 6 months? 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   PLOF: Independent  PATIENT GOALS: Try to prevent surgery and decrease pain.   NEXT MD VISIT: none yet  OBJECTIVE:  Note: Objective measures were completed at Evaluation unless otherwise noted.  DIAGNOSTIC FINDINGS:  MR SHOULDER RIGHT WO CONTRAST 06/01/2023  Narrative & Impression  CLINICAL DATA:  Right shoulder pain for 2.5 months.  Lifting injury.   EXAM: MRI OF THE RIGHT SHOULDER WITHOUT CONTRAST   TECHNIQUE: Multiplanar, multisequence MR imaging of the shoulder was performed. No intravenous contrast was administered.   COMPARISON:  None Available.   FINDINGS: Rotator cuff: There is a partial-thickness bursal sided tear of a mid AP dimension of the supraspinatus tendon measuring up to 4 mm in transverse dimension (coronal images 15 and 16) and 8 mm in AP dimension (sagittal image 7). The posterior aspect of this tear is smaller and located within the midsubstance of the tendon (coronal image 15). No significant tendon retraction. Mild-to-moderate underlying intermediate T2 signal posterior supraspinatus and anterior infraspinatus tendinosis. The subscapularis and teres minor are intact.   Muscles: No rotator cuff muscle atrophy, fatty infiltration, or edema.   Biceps long head: The intra-articular long head of the biceps tendon is intact.   Acromioclavicular Joint: There are mild degenerative changes  of the acromioclavicular joint including joint space narrowing, subchondral marrow edema, and peripheral osteophytosis. Type I acromion. Trace fluid within the subacromial/subdeltoid bursa.   Glenohumeral Joint: Mild-to-moderate inferior anterior humeral head and mid glenoid cartilage thinning. No glenohumeral joint effusion.   Labrum: Grossly intact, but evaluation is limited by lack  of intraarticular fluid.   Bones:  No acute fracture.   Other: None.   IMPRESSION: 1. Partial-thickness bursal sided tear of the mid AP dimension of the supraspinatus tendon measuring up to 4 mm in transverse dimension and 8 mm in AP dimension. No significant tendon retraction. Mild-to-moderate underlying posterior supraspinatus and anterior infraspinatus tendinosis. 2. Mild degenerative changes of the acromioclavicular joint. 3. Mild-to-moderate inferior anterior humeral head and mid glenoid cartilage thinning.     Electronically Signed   By: Bertina Broccoli M.D.   On: 06/09/2023 12:19       PATIENT SURVEYS:  Upper Extremity Functional Index (UEFI): 32/80  COGNITION: Overall cognitive status: Within functional limits for tasks assessed  SENSATION:   POSTURE: Forward neck, movement preference around C5/C6 area, B protracted shoulders, L lateral shift with L trunk rotation.  PALPATION: TTP R first rib area, AC joint, acromion process anterior and lateral TTP R anterior and anterior medial arm TTP R lateral arm    Muscle tension R upper trap.    CERVICAL ROM:   Active ROM A/PROM (deg) eval  Flexion WFL with R upper trap and anterior lateral shoulder and arm symptoms (worse than extension)  Extension Limited with R upper trap and anterior lateral shoulder and arm symptoms.   Right lateral flexion WFL with L lateral neck tenderness  Left lateral flexion WFL with R upper trap and anterior lateral shoulder and arm symptoms.  As well as R anterior arm symptoms. Followed by R ulnar nerve/C8 dermatome symptoms to R medial elbow  Right rotation limited  Left rotation Limited and R anterior shoulder symptoms.    (Blank rows = not tested)  UPPER EXTREMITY ROM:  Active ROM Right eval Left eval  Shoulder flexion 70 with pain (95 degrees AAROM with pain, empty end feel)   Shoulder extension    Shoulder abduction 62 with pain (83 degrees AAROM with pain, empty end feel)    Shoulder adduction    Shoulder extension    Shoulder internal rotation (functional) R thumb to R piriformis with R anterior shoulder and arm pain   Shoulder external rotation (functional) Unable to reach head, has R anterior shoulder pain.    Elbow flexion    Elbow extension    Wrist flexion    Wrist extension    Wrist ulnar deviation    Wrist radial deviation    Wrist pronation    Wrist supination     (Blank rows = not tested)  UPPER EXTREMITY MMT:  MMT Right eval Left eval  Shoulder flexion 3-   Shoulder extension    Shoulder abduction 3-   Shoulder adduction    Shoulder extension    Shoulder internal rotation 4- with anterior shoulder pain   Shoulder external rotation 4   Middle trapezius    Lower trapezius (seated manually resisted) 4- 4-  Elbow flexion 4 with anterior shoulder and arm pain with R 4th and 5th digit paresthesia   Elbow extension (arm at side, 90 degrees elbow flexion) 4/5 with R anterior shoulder pain.    Wrist flexion    Wrist extension    Wrist ulnar deviation    Wrist radial deviation  Wrist pronation    Wrist supination    Grip strength     (Blank rows = not tested)  CERVICAL SPECIAL TESTS:  No change in pain with R shoulder flexion and scapular retraction  Chin tuck increased R UE symptoms.   (+) empty can, Hawkin's kennedy test R shoulder    FUNCTIONAL TESTS:    TREATMENT DATE: 06/27/2023                                                                                                                               Neuromuscular re-education  Hooklying  cervical nod with B scapular retraction and tongue press at roof of mouth 10x3 with 5 second holds   Addition of transversus abdominis contraction during 3rd set secondary to low back discomfort   R shoulder flexion to 90 degrees (with transversus abdominis activation) starting with elbow bent 10x with PT assist  with B scapular retraction  Then AROM 10x2  R shoulder scaption to 90  degrees, with B scapular retraction, elbow bent with PT assist 10x. R shoulder discomfort.    R shoulder ER isometrics, PT manual resistance, elbow flexed to 90 degrees 10x3 with 5 second holds With transversus abdominis contraction and L leg straight (decreased low back discomfort in this position)   Log roll supine to L S/L then L S/L to sit with PT assist 1x.   Standing with R foot propped onto 4 inch step (for low back comfort) and with transversus abdominis contraction  R shoulder ER isometrics with wrist at doorway 10x5 seconds     Improved exercise technique, movement at target joints, use of target muscles after mod verbal, visual, tactile cues.   Decreased R shoulder pain to 2/10 after session   PATIENT EDUCATION:  Education details: POC Person educated: Patient Education method: Explanation Education comprehension: verbalized understanding  HOME EXERCISE PROGRAM: Access Code: [email or web address] URL: [email or web address] Date: 06/27/2023 Prepared by: Suzzane Estes  Exercises - Supine Head Nod with Deep Neck Flexor Activation  - 3 x daily - 7 x weekly - 3 sets - 10 reps - 5 seconds hold - Supine Shoulder Flexion Extension Full Range AROM  - 1 x daily - 7 x weekly - 3 sets - 10 reps     ASSESSMENT:  CLINICAL IMPRESSION:  Worked on anterior cervical and scapular strengthening to decrease posterior cervical muscle tension to neck to help decrease R UE radiating symptoms and improve comfort level with R shoulder joint rehab. Worked on R UE glenohumeral joint mechanics as well as improving ER muscle activation to help decrease anterior shoulder joint impingement. Decreased R shoulder pain to 2/10 reported after session. Pt will benefit from continued skilled physical therapy services to decrease pain, improve strength, ROM, and function.    OBJECTIVE IMPAIRMENTS: decreased ROM, decreased strength, impaired UE functional use, improper body mechanics, postural  dysfunction, and pain.   ACTIVITY LIMITATIONS: carrying, lifting, toileting, dressing, self feeding, reach over head,  and hygiene/grooming  PARTICIPATION LIMITATIONS:   PERSONAL FACTORS: Fitness, Past/current experiences, Time since onset of injury/illness/exacerbation, and 3+ comorbidities: anxiety, arthritis, asthma, depression, DM, DJD are also affecting patient's functional outcome.   REHAB POTENTIAL: Fair    CLINICAL DECISION MAKING: Stable/uncomplicated  EVALUATION COMPLEXITY: Low   GOALS: Goals reviewed with patient? Yes  SHORT TERM GOALS: Target date: 07/05/2023  Pt will be independent with her initial HEP to decrease pain, improve ROM, strength, function, and ability to reach as well as don and doff clothes more comfortably.  Baseline:Pt has not yet started her initial HEP (06/24/2023)  Goal status: INITIAL  LONG TERM GOALS: Target date: 08/23/2023  Pt will have a decrease in R shoulder and arm pain to 5/10 or less at worst to promote ability to reach, don and doff clothes, lift, and carry more comfortably.  Baseline: 10/10 R shoulder and UE pain at worst (06/24/2023) Goal status: INITIAL  2.  Pt will improve her Upper Extremity Functional Index (UEFI) by at least 10 points as a demonstration of improved function.  Baseline: Upper Extremity Functional Index (UEFI): 32/80 (06/24/2023) Goal status: INITIAL  3.  Pt will improve her R shoulder flexion and abduction AROM to at least 110 degrees to promote ability to reach with less difficulty.  Baseline:  Active ROM Right eval  Shoulder flexion 70 with pain (95 degrees AAROM with pain, empty end feel)  Shoulder extension   Shoulder abduction 62 with pain (83 degrees AAROM with pain, empty end feel)   (06/24/2023)  Goal status: INITIAL  4.  Pt will improve her R shoulder functional IR to R thumb to T7 spinous process and functional R shoulder ER to 3rd digit to behind head to promote ability to don and doff clothes as well as  perform self care/grooming more comfortably as well as with less difficulty  Baseline:  Active ROM Right eval  Shoulder internal rotation (functional) R thumb to R piriformis with R anterior shoulder and arm pain  Shoulder external rotation (functional) Unable to reach head, has R anterior shoulder pain.    (06/24/2023)  Goal status: INITIAL  5.  Pt will improve her R UE strength by at least 1/2 MMT to promote ability to use her R UE for functional tasks with less difficulty.  Baseline:  MMT Right eval  Shoulder flexion 3-  Shoulder extension   Shoulder abduction 3-  Shoulder adduction   Shoulder extension   Shoulder internal rotation 4- with anterior shoulder pain  Shoulder external rotation 4  Middle trapezius   Lower trapezius (seated manually resisted) 4-  Elbow flexion 4 with anterior shoulder and arm pain with R 4th and 5th digit paresthesia  Elbow extension (arm at side, 90 degrees elbow flexion) 4/5 with R anterior shoulder pain.    (06/24/2023)  Goal status: INITIAL     PLAN:  PT FREQUENCY: 1-2x/week  PT DURATION: 8 weeks  PLANNED INTERVENTIONS: 97110-Therapeutic exercises, 97530- Therapeutic activity, 97112- Neuromuscular re-education, 97535- Self Care, [id number]- Manual therapy, [id number]- Electrical stimulation (unattended), 60454- Traction (mechanical), 97033- Ionotophoresis 4mg /ml Dexamethasone , Patient/Family education, Dry Needling, Joint mobilization, Joint manipulation, Spinal manipulation, and Spinal mobilization  PLAN FOR NEXT SESSION: posture, scapular strengthening, R shoulder ROM and strengthening, manual techniques, modalities PRN   Dezmond Downie, PT, DPT 06/27/2023, 7:09 PM

## 2023-07-01 ENCOUNTER — Ambulatory Visit: Payer: PRIVATE HEALTH INSURANCE

## 2023-07-01 DIAGNOSIS — M25511 Pain in right shoulder: Secondary | ICD-10-CM | POA: Diagnosis not present

## 2023-07-01 DIAGNOSIS — M5412 Radiculopathy, cervical region: Secondary | ICD-10-CM

## 2023-07-01 NOTE — Therapy (Signed)
 OUTPATIENT PHYSICAL THERAPY TREATMENT  Patient Name: Claire [name] MRN: [id number] DOB:1970-06-23, 53 y.o., female Today's Date: 07/01/2023  END OF SESSION:  PT End of Session - 07/01/23 1039     Visit Number 3    Number of Visits 17    Date for PT Re-Evaluation 08/23/23    PT Start Time 1039    PT Stop Time 1118    PT Time Calculation (min) 39 min    Activity Tolerance Patient tolerated treatment well;Patient limited by pain    Behavior During Therapy [facility] for tasks assessed/performed               Past Medical History:  Diagnosis Date   Anemia    Anxiety    Arthritis    hands, hips   Asthma    Bilateral leg cramps    Depression    Diabetes mellitus without complication (HCC)    history no longer a problem since weight loss surgery   DJD (degenerative joint disease)    Dyspnea on exertion    a. 07/2019 Echo: EF 60-65%, no rwma, nl RV fxn, triv MR.   GERD (gastroesophageal reflux disease)    Headache    Migraines   IBS (irritable bowel syndrome)    Kidney stone    Low serum vitamin B12    Lung nodules    Migraine    down to approx 4x/mo since starting meds   Muscle spasm    Nonobstructive CAD (coronary artery disease)    a. 06/2014 ETT: Ex time 8:59, max HR 155, 10.1 METS, no ST/T changes; b. 08/2019 Cor CTA: Ca2+ 175 (99th %'ile), LAD 35-49p, LCX 0-97m-->Med rx.   Psoriasis    vaginal area   Seasonal allergies    Sleep apnea    history no longer a problem since weight loss surgery   Vasovagal syncope    [place], [name]    Vitamin D  deficiency    Past Surgical History:  Procedure Laterality Date   BLADDER SURGERY  2010   BREAST BIOPSY Right 2014   stereotatic biopsy   CHOLECYSTECTOMY  2010   COLONOSCOPY  2014    Done at [facility]   COLONOSCOPY WITH PROPOFOL  N/A 01/28/2018   Procedure: COLONOSCOPY WITH PROPOFOL ;  Surgeon: Irby Mannan, MD;  Location: [facility];  Service: Endoscopy;  Laterality: N/A;   DILITATION &  CURRETTAGE/HYSTROSCOPY WITH NOVASURE ABLATION N/A 06/16/2015   Procedure: DILATATION & CURETTAGE/HYSTEROSCOPY WITH NOVASURE ABLATION;  Surgeon: Meriam Stamp, MD;  Location: [facility];  Service: Gynecology;  Laterality: N/A;   ESOPHAGOGASTRODUODENOSCOPY (EGD) WITH PROPOFOL  N/A 01/28/2018   Procedure: ESOPHAGOGASTRODUODENOSCOPY (EGD) WITH BIOPSIES;  Surgeon: Irby Mannan, MD;  Location: [facility];  Service: Endoscopy;  Laterality: N/A;   GASTRIC ROUX-EN-Y N/A 11/03/2019   Procedure: LAPAROSCOPIC 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 WITH UPPER ENDOSCOPY;  Surgeon: Jacolyn Matar, MD;  Location: [facility];  Service: General;  Laterality: N/A;   HIATAL HERNIA REPAIR N/A 11/03/2019   Procedure: HERNIA REPAIR HIATAL;  Surgeon: Jacolyn Matar, MD;  Location: [facility];  Service: General;  Laterality: N/A;   LAPAROSCOPIC GASTRIC SLEEVE RESECTION  2012   LAPAROSCOPY N/A 06/16/2015   Procedure: LAPAROSCOPY DIAGNOSTIC;  Surgeon: Meriam Stamp, MD;  Location: [facility];  Service: Gynecology;  Laterality: N/A;   LAPAROSCOPY N/A 04/01/2018   Procedure: LAPAROSCOPY DIAGNOSTIC ERAS PATHWAY ENTEROLYSIS, CECOPEXY;  Surgeon: Jacolyn Matar, MD;  Location: [facility];  Service: General;  Laterality: N/A;   LITHOTRIPSY  12/11/2017   laser lithotripsy   LYSIS OF ADHESION N/A 06/16/2015   Procedure: LYSIS OF ADHESION;  Surgeon: Meriam Stamp, MD;  Location: [facility];  Service: Gynecology;  Laterality: N/A;   PLANTAR FASCIA SURGERY Bilateral    ROBOTIC ASSISTED SALPINGO OOPHERECTOMY Right 06/16/2015   Procedure: ROBOTIC ASSISTED SALPINGO OOPHORECTOMY, EXCISION OF RIGHT CUL DE SAC MASS;  Surgeon: Meriam Stamp, MD;  Location: [facility];  Service: Gynecology;  Laterality: Right;   TONSILLECTOMY     UPPER GI ENDOSCOPY     Patient Active Problem List   Diagnosis Date Noted   Spondylolisthesis of lumbar region 01/25/2023   Morbid obesity with BMI of 40.0-44.9, adult (HCC) 07/10/2021   ADD  (attention deficit disorder) 07/10/2021   Type 2 diabetes mellitus with obesity (HCC) 07/10/2021   History of thyroiditis 07/10/2021   Fibromyalgia 07/10/2021   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/03/2019   B12 deficiency 04/10/2018   Columnar epithelial-lined lower esophagus    Postprocedural disorder of digestive system    Esophageal dysphagia    Persistent mood (affective) disorder, unspecified (HCC) 01/15/2018   Insomnia related to another mental disorder 01/15/2018   OCD (obsessive compulsive disorder) 10/29/2017   GAD (generalized anxiety disorder) 10/29/2017   Nephrolithiasis 10/22/2017   Atherosclerosis of aorta (HCC) 12/21/2016   Hiatal hernia 08/17/2016   Diverticulosis of colon 08/17/2016   Lumbosacral pain 01/07/2015   Intertrigo 11/05/2014   Perennial allergic rhinitis 09/03/2014   Lung nodules 09/03/2014   Vitamin D  deficiency 09/03/2014   History of kidney stones 09/03/2014   History of iron deficiency anemia 09/03/2014   Mild persistent asthma without complication 11/21/2012   GERD without esophagitis 11/21/2012   History of hyperlipidemia 11/21/2012   Migraine with aura and without status migrainosus 11/21/2012   History of sleep apnea 11/21/2012   IBS (irritable bowel syndrome) 08/15/2012    PCP: Arleen Lacer, MD   REFERRING PROVIDER: Saddie Crane, DO  REFERRING DIAG: M25.511 (ICD-10-CM) - Pain in right shoulder G89.29 (ICD-10-CM) - Other chronic pain M75.41 (ICD-10-CM) - Impingement syndrome of right shoulder  THERAPY DIAG:  Right shoulder pain, unspecified chronicity  Radiculopathy, cervical region  Rationale for Evaluation and Treatment: Rehabilitation  ONSET DATE: February 2025  SUBJECTIVE:  SUBJECTIVE STATEMENT: R shoulder is doing ok, 2-3/10 pain currently.  Overall R shoulder pain has improved since eval.     Hand dominance: Right  PERTINENT HISTORY:  R shoulder pain. Was placed in a sling which made her pain worse. Has to use her R UE a lot. 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 Pt tried to lift up the window to open it for a client and heard a pop or a snap in her shoulder. Pain then radiated to her R arm to her elbow. Pt also states that she has fibromyalgia and gets referred pain. Got a steroid injection last Tuesday 06/18/2023 which helped her R shoulder but still has pain in her R arm. Was a 10/10 prior to the cortisone shot. Has 2 tears in her shoulder which have not retracted.   April 19, 2023 pt caught her client and kept her from falling as the client was trying to stand up from a chair, and pt slid her back to her bed (to her R, pt was on the client's L side). Has a TENS unit which kind of helped. Also tried ice and heat.   Was told that if PT does not work then pt has to see a Careers adviser.   No latex allergies No blood pressure problems per pt.     PAIN:  Are you having pain? Yes: NPRS scale: 4/10 (resting position) Pain location: R shoulder, arm, and elbow Pain description: strong ache Aggravating factors: reaching up, donning and doffing clothes, reaching across, reaching behind her back.  Relieving factors: propping her R arm onto a pillow in slight flexion/abduction.   PRECAUTIONS: No known precautions  RED FLAGS: Bowel or bladder incontinence: No and Cauda equina syndrome: No     WEIGHT BEARING RESTRICTIONS: No  FALLS:  Has patient fallen in last 6 months? 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   PLOF: Independent  PATIENT GOALS: Try to prevent surgery and decrease pain.   NEXT MD VISIT: none yet  OBJECTIVE:  Note: Objective measures were completed at Evaluation unless otherwise noted.  DIAGNOSTIC FINDINGS:  MR SHOULDER RIGHT WO CONTRAST 06/01/2023  Narrative & Impression  CLINICAL DATA:  Right shoulder pain for 2.5 months.  Lifting injury.   EXAM: MRI OF THE RIGHT SHOULDER WITHOUT CONTRAST   TECHNIQUE: Multiplanar, multisequence MR imaging of the shoulder was performed. No intravenous contrast was administered.   COMPARISON:  None Available.   FINDINGS: Rotator cuff: There is a partial-thickness bursal sided tear of a mid AP dimension of the supraspinatus tendon measuring up to 4 mm in transverse dimension (coronal images 15 and 16) and 8 mm in AP dimension (sagittal image 7). The posterior aspect of this tear is smaller and located within the midsubstance of the tendon (coronal image 15). No significant tendon retraction. Mild-to-moderate underlying intermediate T2 signal posterior supraspinatus and anterior infraspinatus tendinosis. The subscapularis and teres minor are intact.   Muscles: No rotator cuff muscle atrophy, fatty infiltration, or edema.   Biceps long head: The intra-articular long head of the biceps tendon is intact.   Acromioclavicular Joint: There are mild degenerative changes of the acromioclavicular joint including joint space  narrowing, subchondral marrow edema, and peripheral osteophytosis. Type I acromion. Trace fluid within the subacromial/subdeltoid bursa.   Glenohumeral Joint: Mild-to-moderate inferior anterior humeral head and mid glenoid cartilage thinning. No glenohumeral joint effusion.   Labrum: Grossly intact, but evaluation is limited by lack of intraarticular fluid.   Bones:  No acute  fracture.   Other: None.   IMPRESSION: 1. Partial-thickness bursal sided tear of the mid AP dimension of the supraspinatus tendon measuring up to 4 mm in transverse dimension and 8 mm in AP dimension. No significant tendon retraction. Mild-to-moderate underlying posterior supraspinatus and anterior infraspinatus tendinosis. 2. Mild degenerative changes of the acromioclavicular joint. 3. Mild-to-moderate inferior anterior humeral head and mid glenoid cartilage thinning.     Electronically Signed   By: Bertina Broccoli M.D.   On: 06/09/2023 12:19       PATIENT SURVEYS:  Upper Extremity Functional Index (UEFI): 32/80  COGNITION: Overall cognitive status: Within functional limits for tasks assessed  SENSATION:   POSTURE: Forward neck, movement preference around C5/C6 area, B protracted shoulders, L lateral shift with L trunk rotation.  PALPATION: TTP R first rib area, AC joint, acromion process anterior and lateral TTP R anterior and anterior medial arm TTP R lateral arm    Muscle tension R upper trap.    CERVICAL ROM:   Active ROM A/PROM (deg) eval  Flexion WFL with R upper trap and anterior lateral shoulder and arm symptoms (worse than extension)  Extension Limited with R upper trap and anterior lateral shoulder and arm symptoms.   Right lateral flexion WFL with L lateral neck tenderness  Left lateral flexion WFL with R upper trap and anterior lateral shoulder and arm symptoms.  As well as R anterior arm symptoms. Followed by R ulnar nerve/C8 dermatome symptoms to R medial elbow  Right rotation limited  Left rotation Limited and R anterior shoulder symptoms.    (Blank rows = not tested)  UPPER EXTREMITY ROM:  Active ROM Right eval Left eval  Shoulder flexion 70 with pain (95 degrees AAROM with pain, empty end feel)   Shoulder extension    Shoulder abduction 62 with pain (83 degrees AAROM with pain, empty end feel)   Shoulder adduction    Shoulder extension     Shoulder internal rotation (functional) R thumb to R piriformis with R anterior shoulder and arm pain   Shoulder external rotation (functional) Unable to reach head, has R anterior shoulder pain.    Elbow flexion    Elbow extension    Wrist flexion    Wrist extension    Wrist ulnar deviation    Wrist radial deviation    Wrist pronation    Wrist supination     (Blank rows = not tested)  UPPER EXTREMITY MMT:  MMT Right eval Left eval  Shoulder flexion 3-   Shoulder extension    Shoulder abduction 3-   Shoulder adduction    Shoulder extension    Shoulder internal rotation 4- with anterior shoulder pain   Shoulder external rotation 4   Middle trapezius    Lower trapezius (seated manually resisted) 4- 4-  Elbow flexion 4 with anterior shoulder and arm pain with R 4th and 5th digit paresthesia   Elbow extension (arm at side, 90 degrees elbow flexion) 4/5 with R anterior shoulder pain.    Wrist flexion    Wrist extension    Wrist ulnar deviation    Wrist radial deviation    Wrist pronation    Wrist  supination    Grip strength     (Blank rows = not tested)  CERVICAL SPECIAL TESTS:  No change in pain with R shoulder flexion and scapular retraction  Chin tuck increased R UE symptoms.   (+) empty can, Hawkin's kennedy test R shoulder    FUNCTIONAL TESTS:    TREATMENT DATE: 07/01/2023                                                                                                                                Manual therapy  Seated STM R upper trap and scalene muscle area Caudal glide R first rib grade 3 to promote mobility   Neuromuscular re-education  Seated B scapular retraction 10x3 with 5 second holds   Seated chin tuck 10x5 seconds  Slight R ulnar nerve hand symptoms.   Improved  technique, movement at target joints, use of target muscles after mod verbal, visual, tactile cues.     Therapeutic exercise  Seated R first rib stretch with strap   With  cervical rotation 10x3   Decreased R medial wrist pain when pressing up.   Standing B shoulder ER yellow band 10x3   Improved exercise technique, movement at target joints, use of target muscles after mod verbal, visual, tactile cues.     PATIENT EDUCATION:  Education details: POC Person educated: Patient Education method: Explanation Education comprehension: verbalized understanding  HOME EXERCISE PROGRAM: Access Code: [email or web address] URL: [email or web address] Date: 07/01/2023 Prepared by: Suzzane Estes  Exercises - Supine Head Nod with Deep Neck Flexor Activation  - 3 x daily - 7 x weekly - 3 sets - 10 reps - 5 seconds hold - Supine Shoulder Flexion Extension Full Range AROM  - 1 x daily - 7 x weekly - 3 sets - 10 reps - Seated Scapular Retraction  - 3 x daily - 7 x weekly - 3 sets - 10 reps - 5 seconds hold   Seated R first rib stretch with strap   With cervical rotation 10x3   Decreased R medial wrist pain when pressing up.     ASSESSMENT:  CLINICAL IMPRESSION:  Improved R shoulder flexion AROM to past 90 degrees with elbow bent today observed. Worked on scapular strengthening and neck mechanics in the upright position today. Also worked on decreasing R scalene nerve tightness and improving R first rib mobility to decrease pressure to R ulnar nerve. Possible decrease in R medial wrist symptoms reported afterwasrds. Also worked on ER strengthening to promote better glenohumeral mechanics when raising her arm. Decreased R shoulder pain to 1-2/10 after session.  Pt will benefit from continued skilled physical therapy services to decrease pain, improve strength, ROM, and function.    OBJECTIVE IMPAIRMENTS: decreased ROM, decreased strength, impaired UE functional use, improper body mechanics, postural dysfunction, and pain.   ACTIVITY LIMITATIONS: carrying, lifting, toileting, dressing, self feeding, reach over head, and hygiene/grooming  PARTICIPATION LIMITATIONS:    PERSONAL FACTORS: Fitness, Past/current  experiences, Time since onset of injury/illness/exacerbation, and 3+ comorbidities: anxiety, arthritis, asthma, depression, DM, DJD are also affecting patient's functional outcome.   REHAB POTENTIAL: Fair    CLINICAL DECISION MAKING: Stable/uncomplicated  EVALUATION COMPLEXITY: Low   GOALS: Goals reviewed with patient? Yes  SHORT TERM GOALS: Target date: 07/05/2023  Pt will be independent with her initial HEP to decrease pain, improve ROM, strength, function, and ability to reach as well as don and doff clothes more comfortably.  Baseline:Pt has not yet started her initial HEP (06/24/2023)  Goal status: INITIAL  LONG TERM GOALS: Target date: 08/23/2023  Pt will have a decrease in R shoulder and arm pain to 5/10 or less at worst to promote ability to reach, don and doff clothes, lift, and carry more comfortably.  Baseline: 10/10 R shoulder and UE pain at worst (06/24/2023) Goal status: INITIAL  2.  Pt will improve her Upper Extremity Functional Index (UEFI) by at least 10 points as a demonstration of improved function.  Baseline: Upper Extremity Functional Index (UEFI): 32/80 (06/24/2023) Goal status: INITIAL  3.  Pt will improve her R shoulder flexion and abduction AROM to at least 110 degrees to promote ability to reach with less difficulty.  Baseline:  Active ROM Right eval  Shoulder flexion 70 with pain (95 degrees AAROM with pain, empty end feel)  Shoulder extension   Shoulder abduction 62 with pain (83 degrees AAROM with pain, empty end feel)   (06/24/2023)  Goal status: INITIAL  4.  Pt will improve her R shoulder functional IR to R thumb to T7 spinous process and functional R shoulder ER to 3rd digit to behind head to promote ability to don and doff clothes as well as perform self care/grooming more comfortably as well as with less difficulty  Baseline:  Active ROM Right eval  Shoulder internal rotation (functional) R thumb to R  piriformis with R anterior shoulder and arm pain  Shoulder external rotation (functional) Unable to reach head, has R anterior shoulder pain.    (06/24/2023)  Goal status: INITIAL  5.  Pt will improve her R UE strength by at least 1/2 MMT to promote ability to use her R UE for functional tasks with less difficulty.  Baseline:  MMT Right eval  Shoulder flexion 3-  Shoulder extension   Shoulder abduction 3-  Shoulder adduction   Shoulder extension   Shoulder internal rotation 4- with anterior shoulder pain  Shoulder external rotation 4  Middle trapezius   Lower trapezius (seated manually resisted) 4-  Elbow flexion 4 with anterior shoulder and arm pain with R 4th and 5th digit paresthesia  Elbow extension (arm at side, 90 degrees elbow flexion) 4/5 with R anterior shoulder pain.    (06/24/2023)  Goal status: INITIAL     PLAN:  PT FREQUENCY: 1-2x/week  PT DURATION: 8 weeks  PLANNED INTERVENTIONS: 97110-Therapeutic exercises, 97530- Therapeutic activity, 97112- Neuromuscular re-education, 97535- Self Care, 16109- Manual therapy, [id number]- Electrical stimulation (unattended), 60454- Traction (mechanical), [id number]- Ionotophoresis 4mg /ml Dexamethasone , Patient/Family education, Dry Needling, Joint mobilization, Joint manipulation, Spinal manipulation, and Spinal mobilization  PLAN FOR NEXT SESSION: posture, scapular strengthening, R shoulder ROM and strengthening, manual techniques, modalities PRN   Anjolaoluwa Siguenza, PT, DPT 07/01/2023, 5:20 PM

## 2023-07-03 ENCOUNTER — Other Ambulatory Visit: Payer: Self-pay | Admitting: Family Medicine

## 2023-07-03 ENCOUNTER — Ambulatory Visit: Payer: PRIVATE HEALTH INSURANCE

## 2023-07-03 DIAGNOSIS — E538 Deficiency of other specified B group vitamins: Secondary | ICD-10-CM

## 2023-07-03 DIAGNOSIS — J4520 Mild intermittent asthma, uncomplicated: Secondary | ICD-10-CM

## 2023-07-08 ENCOUNTER — Ambulatory Visit: Payer: PRIVATE HEALTH INSURANCE

## 2023-07-10 ENCOUNTER — Ambulatory Visit: Payer: PRIVATE HEALTH INSURANCE

## 2023-07-10 DIAGNOSIS — M25511 Pain in right shoulder: Secondary | ICD-10-CM

## 2023-07-10 DIAGNOSIS — M5412 Radiculopathy, cervical region: Secondary | ICD-10-CM

## 2023-07-10 NOTE — Therapy (Signed)
 OUTPATIENT PHYSICAL THERAPY TREATMENT  Patient Name: Claire [name] MRN: [id number] DOB:11-30-1970, 53 y.o., female Today's Date: 07/10/2023  END OF SESSION:  PT End of Session - 07/10/23 0733     Visit Number 4    Number of Visits 17    Date for PT Re-Evaluation 08/23/23    PT Start Time 0733    PT Stop Time 0813    PT Time Calculation (min) 40 min    Activity Tolerance Patient tolerated treatment well;Patient limited by pain    Behavior During Therapy [facility] for tasks assessed/performed                Past Medical History:  Diagnosis Date   Anemia    Anxiety    Arthritis    hands, hips   Asthma    Bilateral leg cramps    Depression    Diabetes mellitus without complication (HCC)    history no longer a problem since weight loss surgery   DJD (degenerative joint disease)    Dyspnea on exertion    a. 07/2019 Echo: EF 60-65%, no rwma, nl RV fxn, triv MR.   GERD (gastroesophageal reflux disease)    Headache    Migraines   IBS (irritable bowel syndrome)    Kidney stone    Low serum vitamin B12    Lung nodules    Migraine    down to approx 4x/mo since starting meds   Muscle spasm    Nonobstructive CAD (coronary artery disease)    a. 06/2014 ETT: Ex time 8:59, max HR 155, 10.1 METS, no ST/T changes; b. 08/2019 Cor CTA: Ca2+ 175 (99th %'ile), LAD 35-49p, LCX 0-62m-->Med rx.   Psoriasis    vaginal area   Seasonal allergies    Sleep apnea    history no longer a problem since weight loss surgery   Vasovagal syncope    [place], [name]    Vitamin D  deficiency    Past Surgical History:  Procedure Laterality Date   BLADDER SURGERY  2010   BREAST BIOPSY Right 2014   stereotatic biopsy   CHOLECYSTECTOMY  2010   COLONOSCOPY  2014    Done at [facility]   COLONOSCOPY WITH PROPOFOL  N/A 01/28/2018   Procedure: COLONOSCOPY WITH PROPOFOL ;  Surgeon: Irby Mannan, MD;  Location: [facility];  Service: Endoscopy;  Laterality: N/A;   DILITATION &  CURRETTAGE/HYSTROSCOPY WITH NOVASURE ABLATION N/A 06/16/2015   Procedure: DILATATION & CURETTAGE/HYSTEROSCOPY WITH NOVASURE ABLATION;  Surgeon: Meriam Stamp, MD;  Location: [facility];  Service: Gynecology;  Laterality: N/A;   ESOPHAGOGASTRODUODENOSCOPY (EGD) WITH PROPOFOL  N/A 01/28/2018   Procedure: ESOPHAGOGASTRODUODENOSCOPY (EGD) WITH BIOPSIES;  Surgeon: Irby Mannan, MD;  Location: [name];  Service: Endoscopy;  Laterality: N/A;   GASTRIC ROUX-EN-Y N/A 11/03/2019   Procedure: LAPAROSCOPIC 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 WITH UPPER ENDOSCOPY;  Surgeon: Jacolyn Matar, MD;  Location: [facility];  Service: General;  Laterality: N/A;   HIATAL HERNIA REPAIR N/A 11/03/2019   Procedure: HERNIA REPAIR HIATAL;  Surgeon: Jacolyn Matar, MD;  Location: [facility];  Service: General;  Laterality: N/A;   LAPAROSCOPIC GASTRIC SLEEVE RESECTION  2012   LAPAROSCOPY N/A 06/16/2015   Procedure: LAPAROSCOPY DIAGNOSTIC;  Surgeon: Meriam Stamp, MD;  Location: [facility];  Service: Gynecology;  Laterality: N/A;   LAPAROSCOPY N/A 04/01/2018   Procedure: LAPAROSCOPY DIAGNOSTIC ERAS PATHWAY ENTEROLYSIS, CECOPEXY;  Surgeon: Jacolyn Matar, MD;  Location: [facility];  Service: General;  Laterality: N/A;  LITHOTRIPSY  12/11/2017   laser lithotripsy   LYSIS OF ADHESION N/A 06/16/2015   Procedure: LYSIS OF ADHESION;  Surgeon: Meriam Stamp, MD;  Location: [facility];  Service: Gynecology;  Laterality: N/A;   PLANTAR FASCIA SURGERY Bilateral    ROBOTIC ASSISTED SALPINGO OOPHERECTOMY Right 06/16/2015   Procedure: ROBOTIC ASSISTED SALPINGO OOPHORECTOMY, EXCISION OF RIGHT CUL DE SAC MASS;  Surgeon: Meriam Stamp, MD;  Location: [facility];  Service: Gynecology;  Laterality: Right;   TONSILLECTOMY     UPPER GI ENDOSCOPY     Patient Active Problem List   Diagnosis Date Noted   Spondylolisthesis of lumbar region 01/25/2023   Morbid obesity with BMI of 40.0-44.9, adult (HCC) 07/10/2021   ADD  (attention deficit disorder) 07/10/2021   Type 2 diabetes mellitus with obesity (HCC) 07/10/2021   History of thyroiditis 07/10/2021   Fibromyalgia 07/10/2021   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/03/2019   B12 deficiency 04/10/2018   Columnar epithelial-lined lower esophagus    Postprocedural disorder of digestive system    Esophageal dysphagia    Persistent mood (affective) disorder, unspecified (HCC) 01/15/2018   Insomnia related to another mental disorder 01/15/2018   OCD (obsessive compulsive disorder) 10/29/2017   GAD (generalized anxiety disorder) 10/29/2017   Nephrolithiasis 10/22/2017   Atherosclerosis of aorta (HCC) 12/21/2016   Hiatal hernia 08/17/2016   Diverticulosis of colon 08/17/2016   Lumbosacral pain 01/07/2015   Intertrigo 11/05/2014   Perennial allergic rhinitis 09/03/2014   Lung nodules 09/03/2014   Vitamin D  deficiency 09/03/2014   History of kidney stones 09/03/2014   History of iron deficiency anemia 09/03/2014   Mild persistent asthma without complication 11/21/2012   GERD without esophagitis 11/21/2012   History of hyperlipidemia 11/21/2012   Migraine with aura and without status migrainosus 11/21/2012   History of sleep apnea 11/21/2012   IBS (irritable bowel syndrome) 08/15/2012    PCP: Arleen Lacer, MD   REFERRING PROVIDER: Saddie Crane, DO  REFERRING DIAG: M25.511 (ICD-10-CM) - Pain in right shoulder G89.29 (ICD-10-CM) - Other chronic pain M75.41 (ICD-10-CM) - Impingement syndrome of right shoulder  THERAPY DIAG:  Right shoulder pain, unspecified chronicity  Radiculopathy, cervical region  Rationale for Evaluation and Treatment: Rehabilitation  ONSET DATE: February 2025  SUBJECTIVE:  SUBJECTIVE STATEMENT: R shoulder is doing ok, 2-3/10 currently. Reaching across bothers it. Can reach up without a problem. Able to don clothes and dry herself after taking a shower more comfortable for her R shoulder.       Hand dominance: Right  PERTINENT HISTORY:  R shoulder pain. Was placed in a sling which made her pain worse. Has to use her R UE a lot. 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 Pt tried to lift up the window to open it for a client and heard a pop or a snap in her shoulder. Pain then radiated to her R arm to her elbow. Pt also states that she has fibromyalgia and gets referred pain. Got a steroid injection last Tuesday 06/18/2023 which helped her R shoulder but still has pain in her R arm. Was a 10/10 prior to the cortisone shot. Has 2 tears in her shoulder which have not retracted.   April 19, 2023 pt caught her client and kept her from falling as the client was trying to stand up from a chair, and pt slid her back to her bed (to her R, pt was on the client's L side). Has a TENS unit which kind of helped. Also tried ice and heat.   Was told that if PT does not work then pt has to see a Careers adviser.   No latex allergies No blood pressure problems per pt.     PAIN:  Are you having pain? Yes: NPRS scale: 4/10 (resting position) Pain location: R shoulder, arm, and elbow Pain description: strong ache Aggravating factors: reaching up, donning and doffing clothes, reaching across, reaching behind her back.  Relieving factors: propping her R arm onto a pillow in slight flexion/abduction.   PRECAUTIONS: No known precautions  RED FLAGS: Bowel or bladder incontinence: No and Cauda equina syndrome: No     WEIGHT BEARING RESTRICTIONS: No  FALLS:  Has patient fallen in last 6 months? 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   PLOF: Independent  PATIENT GOALS: Try to prevent surgery and decrease pain.   NEXT MD VISIT: none yet  OBJECTIVE:  Note: Objective measures were completed at Evaluation unless otherwise noted.  DIAGNOSTIC FINDINGS:  MR SHOULDER RIGHT WO CONTRAST 06/01/2023  Narrative & Impression  CLINICAL DATA:  Right shoulder pain for 2.5 months.  Lifting injury.   EXAM: MRI OF THE RIGHT SHOULDER WITHOUT CONTRAST   TECHNIQUE: Multiplanar, multisequence MR imaging of the shoulder was performed. No intravenous contrast was administered.   COMPARISON:  None Available.   FINDINGS: Rotator cuff: There is a partial-thickness bursal sided tear of a mid AP dimension of the supraspinatus tendon measuring up to 4 mm in transverse dimension (coronal images 15 and 16) and 8 mm in AP dimension (sagittal image 7). The posterior aspect of this tear is smaller and located within the midsubstance of the tendon (coronal image 15). No significant tendon retraction. Mild-to-moderate underlying intermediate T2 signal posterior supraspinatus and anterior infraspinatus tendinosis. The subscapularis and teres minor are intact.   Muscles: No rotator cuff muscle atrophy, fatty infiltration, or edema.   Biceps long head: The intra-articular long head of the biceps  tendon is intact.   Acromioclavicular Joint: There are mild degenerative changes of the acromioclavicular joint including joint space narrowing, subchondral marrow edema, and peripheral osteophytosis. Type I acromion. Trace fluid within the subacromial/subdeltoid bursa.   Glenohumeral Joint: Mild-to-moderate inferior anterior humeral head and mid glenoid cartilage thinning. No glenohumeral joint  effusion.   Labrum: Grossly intact, but evaluation is limited by lack of intraarticular fluid.   Bones:  No acute fracture.   Other: None.   IMPRESSION: 1. Partial-thickness bursal sided tear of the mid AP dimension of the supraspinatus tendon measuring up to 4 mm in transverse dimension and 8 mm in AP dimension. No significant tendon retraction. Mild-to-moderate underlying posterior supraspinatus and anterior infraspinatus tendinosis. 2. Mild degenerative changes of the acromioclavicular joint. 3. Mild-to-moderate inferior anterior humeral head and mid glenoid cartilage thinning.     Electronically Signed   By: Bertina Broccoli M.D.   On: 06/09/2023 12:19       PATIENT SURVEYS:  Upper Extremity Functional Index (UEFI): 32/80  COGNITION: Overall cognitive status: Within functional limits for tasks assessed  SENSATION:   POSTURE: Forward neck, movement preference around C5/C6 area, B protracted shoulders, L lateral shift with L trunk rotation.  PALPATION: TTP R first rib area, AC joint, acromion process anterior and lateral TTP R anterior and anterior medial arm TTP R lateral arm    Muscle tension R upper trap.    CERVICAL ROM:   Active ROM A/PROM (deg) eval  Flexion WFL with R upper trap and anterior lateral shoulder and arm symptoms (worse than extension)  Extension Limited with R upper trap and anterior lateral shoulder and arm symptoms.   Right lateral flexion WFL with L lateral neck tenderness  Left lateral flexion WFL with R upper trap and anterior lateral shoulder and arm symptoms.  As well as R anterior arm symptoms. Followed by R ulnar nerve/C8 dermatome symptoms to R medial elbow  Right rotation limited  Left rotation Limited and R anterior shoulder symptoms.    (Blank rows = not tested)  UPPER EXTREMITY ROM:  Active ROM Right eval Left eval  Shoulder flexion 70 with pain (95 degrees AAROM with pain, empty end feel)   Shoulder extension     Shoulder abduction 62 with pain (83 degrees AAROM with pain, empty end feel)   Shoulder adduction    Shoulder extension    Shoulder internal rotation (functional) R thumb to R piriformis with R anterior shoulder and arm pain   Shoulder external rotation (functional) Unable to reach head, has R anterior shoulder pain.    Elbow flexion    Elbow extension    Wrist flexion    Wrist extension    Wrist ulnar deviation    Wrist radial deviation    Wrist pronation    Wrist supination     (Blank rows = not tested)  UPPER EXTREMITY MMT:  MMT Right eval Left eval  Shoulder flexion 3-   Shoulder extension    Shoulder abduction 3-   Shoulder adduction    Shoulder extension    Shoulder internal rotation 4- with anterior shoulder pain   Shoulder external rotation 4   Middle trapezius    Lower trapezius (seated manually resisted) 4- 4-  Elbow flexion 4 with anterior shoulder and arm pain with R 4th and 5th digit paresthesia   Elbow extension (arm at side, 90 degrees elbow flexion) 4/5 with R anterior shoulder pain.    Wrist flexion    Wrist extension  Wrist ulnar deviation    Wrist radial deviation    Wrist pronation    Wrist supination    Grip strength     (Blank rows = not tested)  CERVICAL SPECIAL TESTS:  No change in pain with R shoulder flexion and scapular retraction  Chin tuck increased R UE symptoms.   (+) empty can, Hawkin's kennedy test R shoulder    FUNCTIONAL TESTS:    TREATMENT DATE: 07/10/2023                                                                                                                                Therapeutic exercise  Seated R triceps and shoulder extension isometric with wrist on table 10x3 with 5 second holds to promote posterior glide of humeral head.   Standing B shoulder ER yellow band 10x3  Standing R shoulder IR red 10x3 with 5 second holds  Decreased pain with reaching across   Standing R shoulder IR isometrics, hand on  abdomen 10x5 seconds for 3 sets  Decreased pain with reaching across  Seated chin tuck 10x5 seconds for 3 sets       Improved exercise technique, movement at target joints, use of target muscles after mod verbal, visual, tactile cues.     PATIENT EDUCATION:  Education details: POC Person educated: Patient Education method: Explanation Education comprehension: verbalized understanding  HOME EXERCISE PROGRAM: Access Code: [email or web address] URL: [email or web address] Date: 07/01/2023 Prepared by: Suzzane Estes  Exercises - Supine Head Nod with Deep Neck Flexor Activation  - 3 x daily - 7 x weekly - 3 sets - 10 reps - 5 seconds hold - Supine Shoulder Flexion Extension Full Range AROM  - 1 x daily - 7 x weekly - 3 sets - 10 reps - Seated Scapular Retraction  - 3 x daily - 7 x weekly - 3 sets - 10 reps - 5 seconds hold   Seated R first rib stretch with strap   With cervical rotation 10x3   Decreased R medial wrist pain when pressing up.   - Standing Shoulder Internal Rotation with Anchored Resistance  - 1 x daily - 7 x weekly - 3 sets - 10 reps - 5 seconds hold     ASSESSMENT:  CLINICAL IMPRESSION: Decreased R shoulder and anterior arm pain with reaching across with exercises to promote activation and strengthening of R subscapularis muscle to promote posterior glide of humeral head during the movement. Pt will benefit from continued skilled physical therapy services to decrease pain, improve strength, ROM, and function.    OBJECTIVE IMPAIRMENTS: decreased ROM, decreased strength, impaired UE functional use, improper body mechanics, postural dysfunction, and pain.   ACTIVITY LIMITATIONS: carrying, lifting, toileting, dressing, self feeding, reach over head, and hygiene/grooming  PARTICIPATION LIMITATIONS:   PERSONAL FACTORS: Fitness, Past/current experiences, Time since onset of injury/illness/exacerbation, and 3+ comorbidities: anxiety, arthritis, asthma,  depression, DM, DJD are also affecting patient's functional outcome.   REHAB  POTENTIAL: Fair    CLINICAL DECISION MAKING: Stable/uncomplicated  EVALUATION COMPLEXITY: Low   GOALS: Goals reviewed with patient? Yes  SHORT TERM GOALS: Target date: 07/05/2023  Pt will be independent with her initial HEP to decrease pain, improve ROM, strength, function, and ability to reach as well as don and doff clothes more comfortably.  Baseline:Pt has not yet started her initial HEP (06/24/2023)  Goal status: INITIAL  LONG TERM GOALS: Target date: 08/23/2023  Pt will have a decrease in R shoulder and arm pain to 5/10 or less at worst to promote ability to reach, don and doff clothes, lift, and carry more comfortably.  Baseline: 10/10 R shoulder and UE pain at worst (06/24/2023) Goal status: INITIAL  2.  Pt will improve her Upper Extremity Functional Index (UEFI) by at least 10 points as a demonstration of improved function.  Baseline: Upper Extremity Functional Index (UEFI): 32/80 (06/24/2023) Goal status: INITIAL  3.  Pt will improve her R shoulder flexion and abduction AROM to at least 110 degrees to promote ability to reach with less difficulty.  Baseline:  Active ROM Right eval  Shoulder flexion 70 with pain (95 degrees AAROM with pain, empty end feel)  Shoulder extension   Shoulder abduction 62 with pain (83 degrees AAROM with pain, empty end feel)   (06/24/2023)  Goal status: INITIAL  4.  Pt will improve her R shoulder functional IR to R thumb to T7 spinous process and functional R shoulder ER to 3rd digit to behind head to promote ability to don and doff clothes as well as perform self care/grooming more comfortably as well as with less difficulty  Baseline:  Active ROM Right eval  Shoulder internal rotation (functional) R thumb to R piriformis with R anterior shoulder and arm pain  Shoulder external rotation (functional) Unable to reach head, has R anterior shoulder pain.     (06/24/2023)  Goal status: INITIAL  5.  Pt will improve her R UE strength by at least 1/2 MMT to promote ability to use her R UE for functional tasks with less difficulty.  Baseline:  MMT Right eval  Shoulder flexion 3-  Shoulder extension   Shoulder abduction 3-  Shoulder adduction   Shoulder extension   Shoulder internal rotation 4- with anterior shoulder pain  Shoulder external rotation 4  Middle trapezius   Lower trapezius (seated manually resisted) 4-  Elbow flexion 4 with anterior shoulder and arm pain with R 4th and 5th digit paresthesia  Elbow extension (arm at side, 90 degrees elbow flexion) 4/5 with R anterior shoulder pain.    (06/24/2023)  Goal status: INITIAL     PLAN:  PT FREQUENCY: 1-2x/week  PT DURATION: 8 weeks  PLANNED INTERVENTIONS: 97110-Therapeutic exercises, 97530- Therapeutic activity, 97112- Neuromuscular re-education, 97535- Self Care, 16109- Manual therapy, [id number]- Electrical stimulation (unattended), 60454- Traction (mechanical), 97033- Ionotophoresis 4mg /ml Dexamethasone , Patient/Family education, Dry Needling, Joint mobilization, Joint manipulation, Spinal manipulation, and Spinal mobilization  PLAN FOR NEXT SESSION: posture, scapular strengthening, R shoulder ROM and strengthening, manual techniques, modalities PRN   Roanna Reaves, PT, DPT 07/10/2023, 8:15 AM

## 2023-07-16 ENCOUNTER — Ambulatory Visit: Payer: PRIVATE HEALTH INSURANCE

## 2023-07-16 DIAGNOSIS — M5412 Radiculopathy, cervical region: Secondary | ICD-10-CM

## 2023-07-16 DIAGNOSIS — M25511 Pain in right shoulder: Secondary | ICD-10-CM | POA: Diagnosis not present

## 2023-07-16 NOTE — Therapy (Signed)
 OUTPATIENT PHYSICAL THERAPY TREATMENT  Patient Name: Claire [name] MRN: [id number] DOB:23-Aug-1970, 53 y.o., female Today's Date: 07/16/2023  END OF SESSION:  PT End of Session - 07/16/23 1041     Visit Number 5    Number of Visits 17    Date for PT Re-Evaluation 08/23/23    PT Start Time 1041    PT Stop Time 1120    PT Time Calculation (min) 39 min    Activity Tolerance Patient tolerated treatment well;Patient limited by pain    Behavior During Therapy [facility] for tasks assessed/performed                 Past Medical History:  Diagnosis Date   Anemia    Anxiety    Arthritis    hands, hips   Asthma    Bilateral leg cramps    Depression    Diabetes mellitus without complication (HCC)    history no longer a problem since weight loss surgery   DJD (degenerative joint disease)    Dyspnea on exertion    a. 07/2019 Echo: EF 60-65%, no rwma, nl RV fxn, triv MR.   GERD (gastroesophageal reflux disease)    Headache    Migraines   IBS (irritable bowel syndrome)    Kidney stone    Low serum vitamin B12    Lung nodules    Migraine    down to approx 4x/mo since starting meds   Muscle spasm    Nonobstructive CAD (coronary artery disease)    a. 06/2014 ETT: Ex time 8:59, max HR 155, 10.1 METS, no ST/T changes; b. 08/2019 Cor CTA: Ca2+ 175 (99th %'ile), LAD 35-49p, LCX 0-59m-->Med rx.   Psoriasis    vaginal area   Seasonal allergies    Sleep apnea    history no longer a problem since weight loss surgery   Vasovagal syncope    [place], [name]    Vitamin D  deficiency    Past Surgical History:  Procedure Laterality Date   BLADDER SURGERY  2010   BREAST BIOPSY Right 2014   stereotatic biopsy   CHOLECYSTECTOMY  2010   COLONOSCOPY  2014    [facility] at [facility]   COLONOSCOPY WITH PROPOFOL  N/A 01/28/2018   Procedure: COLONOSCOPY WITH PROPOFOL ;  Surgeon: Irby Mannan, MD;  Location: [facility];  Service: Endoscopy;  Laterality: N/A;   DILITATION &  CURRETTAGE/HYSTROSCOPY WITH NOVASURE ABLATION N/A 06/16/2015   Procedure: DILATATION & CURETTAGE/HYSTEROSCOPY WITH NOVASURE ABLATION;  Surgeon: Meriam Stamp, MD;  Location: [facility];  Service: Gynecology;  Laterality: N/A;   ESOPHAGOGASTRODUODENOSCOPY (EGD) WITH PROPOFOL  N/A 01/28/2018   Procedure: ESOPHAGOGASTRODUODENOSCOPY (EGD) WITH BIOPSIES;  Surgeon: Irby Mannan, MD;  Location: [facility];  Service: Endoscopy;  Laterality: N/A;   GASTRIC ROUX-EN-Y N/A 11/03/2019   Procedure: LAPAROSCOPIC 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 WITH UPPER ENDOSCOPY;  Surgeon: Jacolyn Matar, MD;  Location: [facility];  Service: General;  Laterality: N/A;   HIATAL HERNIA REPAIR N/A 11/03/2019   Procedure: HERNIA REPAIR HIATAL;  Surgeon: Jacolyn Matar, MD;  Location: [facility];  Service: General;  Laterality: N/A;   LAPAROSCOPIC GASTRIC SLEEVE RESECTION  2012   LAPAROSCOPY N/A 06/16/2015   Procedure: LAPAROSCOPY DIAGNOSTIC;  Surgeon: Meriam Stamp, MD;  Location: [facility];  Service: Gynecology;  Laterality: N/A;   LAPAROSCOPY N/A 04/01/2018   Procedure: LAPAROSCOPY DIAGNOSTIC ERAS PATHWAY ENTEROLYSIS, CECOPEXY;  Surgeon: Jacolyn Matar, MD;  Location: [facility];  Service: General;  Laterality: N/A;  LITHOTRIPSY  12/11/2017   laser lithotripsy   LYSIS OF ADHESION N/A 06/16/2015   Procedure: LYSIS OF ADHESION;  Surgeon: Meriam Stamp, MD;  Location: [facility];  Service: Gynecology;  Laterality: N/A;   PLANTAR FASCIA SURGERY Bilateral    ROBOTIC ASSISTED SALPINGO OOPHERECTOMY Right 06/16/2015   Procedure: ROBOTIC ASSISTED SALPINGO OOPHORECTOMY, EXCISION OF RIGHT CUL DE SAC MASS;  Surgeon: Meriam Stamp, MD;  Location: [facility];  Service: Gynecology;  Laterality: Right;   TONSILLECTOMY     UPPER GI ENDOSCOPY     Patient Active Problem List   Diagnosis Date Noted   Spondylolisthesis of lumbar region 01/25/2023   Morbid obesity with BMI of 40.0-44.9, adult (HCC) 07/10/2021   ADD  (attention deficit disorder) 07/10/2021   Type 2 diabetes mellitus with obesity (HCC) 07/10/2021   History of thyroiditis 07/10/2021   Fibromyalgia 07/10/2021   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/03/2019   B12 deficiency 04/10/2018   Columnar epithelial-lined lower esophagus    Postprocedural disorder of digestive system    Esophageal dysphagia    Persistent mood (affective) disorder, unspecified (HCC) 01/15/2018   Insomnia related to another mental disorder 01/15/2018   OCD (obsessive compulsive disorder) 10/29/2017   GAD (generalized anxiety disorder) 10/29/2017   Nephrolithiasis 10/22/2017   Atherosclerosis of aorta (HCC) 12/21/2016   Hiatal hernia 08/17/2016   Diverticulosis of colon 08/17/2016   Lumbosacral pain 01/07/2015   Intertrigo 11/05/2014   Perennial allergic rhinitis 09/03/2014   Lung nodules 09/03/2014   Vitamin D  deficiency 09/03/2014   History of kidney stones 09/03/2014   History of iron deficiency anemia 09/03/2014   Mild persistent asthma without complication 11/21/2012   GERD without esophagitis 11/21/2012   History of hyperlipidemia 11/21/2012   Migraine with aura and without status migrainosus 11/21/2012   History of sleep apnea 11/21/2012   IBS (irritable bowel syndrome) 08/15/2012    PCP: Arleen Lacer, MD   REFERRING PROVIDER: Saddie Crane, DO  REFERRING DIAG: M25.511 (ICD-10-CM) - Pain in right shoulder G89.29 (ICD-10-CM) - Other chronic pain M75.41 (ICD-10-CM) - Impingement syndrome of right shoulder  THERAPY DIAG:  Right shoulder pain, unspecified chronicity  Radiculopathy, cervical region  Rationale for Evaluation and Treatment: Rehabilitation  ONSET DATE: February 2025  SUBJECTIVE:  SUBJECTIVE STATEMENT: R shoulder is doing ok, today is a bad day because of the rain. Bringing her R arm up to the goal post positing causes pinching lateral shoulder. 2-3/10 currently. Not as bad as before PT. The exercises have been helping a lot.     Hand dominance: Right  PERTINENT HISTORY:  R shoulder pain. Was placed in a sling which made her pain worse. Has to use her R UE a lot. 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 Pt tried to lift up the window to open it for a client and heard a pop or a snap in her shoulder. Pain then radiated to her R arm to her elbow. Pt also states that she has fibromyalgia and gets referred pain. Got a steroid injection last Tuesday 06/18/2023 which helped her R shoulder but still has pain in her R arm. Was a 10/10 prior to the cortisone shot. Has 2 tears in her shoulder which have not retracted.   April 19, 2023 pt caught her client and kept her from falling as the client was trying to stand up from a chair, and pt slid her back to her bed (to her R, pt was on the client's L side). Has a TENS unit which kind of helped. Also tried ice and heat.   Was told that if PT does not work then pt has to see a Careers adviser.   No latex allergies No blood pressure problems per pt.     PAIN:  Are you having pain? Yes: NPRS scale: 4/10 (resting position) Pain location: R shoulder, arm, and elbow Pain description: strong ache Aggravating factors: reaching up, donning and doffing clothes, reaching across, reaching behind her back.  Relieving factors: propping her R arm onto a pillow in slight flexion/abduction.   PRECAUTIONS: No known precautions  RED FLAGS: Bowel or bladder incontinence: No and Cauda equina syndrome: No     WEIGHT BEARING RESTRICTIONS: No  FALLS:  Has patient fallen in last 6 months? 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   PLOF: Independent  PATIENT GOALS: Try to prevent surgery and decrease pain.   NEXT MD VISIT: none yet  OBJECTIVE:  Note: Objective measures were completed at Evaluation unless otherwise noted.  DIAGNOSTIC FINDINGS:  MR SHOULDER RIGHT WO CONTRAST 06/01/2023  Narrative & Impression  CLINICAL DATA:  Right shoulder pain for 2.5 months.  Lifting injury.   EXAM: MRI OF THE RIGHT SHOULDER WITHOUT CONTRAST   TECHNIQUE: Multiplanar, multisequence MR imaging of the shoulder was performed. No intravenous contrast was administered.   COMPARISON:  None Available.   FINDINGS: Rotator cuff: There is a partial-thickness bursal sided tear of a mid AP dimension of the supraspinatus tendon measuring up to 4 mm in transverse dimension (coronal images 15 and 16) and 8 mm in AP dimension (sagittal image 7). The posterior aspect of this tear is smaller and located within the midsubstance of the tendon (coronal image 15). No significant tendon retraction. Mild-to-moderate underlying intermediate T2 signal posterior supraspinatus and anterior infraspinatus tendinosis. The subscapularis and teres minor are intact.   Muscles: No rotator cuff muscle atrophy, fatty infiltration, or edema.   Biceps long head:  The intra-articular long head of the biceps tendon is intact.   Acromioclavicular Joint: There are mild degenerative changes of the acromioclavicular joint including joint space narrowing, subchondral marrow edema, and peripheral osteophytosis. Type I acromion. Trace fluid within the subacromial/subdeltoid bursa.   Glenohumeral Joint: Mild-to-moderate inferior anterior humeral head and mid glenoid cartilage thinning. No  glenohumeral joint effusion.   Labrum: Grossly intact, but evaluation is limited by lack of intraarticular fluid.   Bones:  No acute fracture.   Other: None.   IMPRESSION: 1. Partial-thickness bursal sided tear of the mid AP dimension of the supraspinatus tendon measuring up to 4 mm in transverse dimension and 8 mm in AP dimension. No significant tendon retraction. Mild-to-moderate underlying posterior supraspinatus and anterior infraspinatus tendinosis. 2. Mild degenerative changes of the acromioclavicular joint. 3. Mild-to-moderate inferior anterior humeral head and mid glenoid cartilage thinning.     Electronically Signed   By: Bertina Broccoli M.D.   On: 06/09/2023 12:19       PATIENT SURVEYS:  Upper Extremity Functional Index (UEFI): 32/80  COGNITION: Overall cognitive status: Within functional limits for tasks assessed  SENSATION:   POSTURE: Forward neck, movement preference around C5/C6 area, B protracted shoulders, L lateral shift with L trunk rotation.  PALPATION: TTP R first rib area, AC joint, acromion process anterior and lateral TTP R anterior and anterior medial arm TTP R lateral arm    Muscle tension R upper trap.    CERVICAL ROM:   Active ROM A/PROM (deg) eval  Flexion WFL with R upper trap and anterior lateral shoulder and arm symptoms (worse than extension)  Extension Limited with R upper trap and anterior lateral shoulder and arm symptoms.   Right lateral flexion WFL with L lateral neck tenderness  Left lateral flexion WFL with R upper trap and anterior lateral shoulder and arm symptoms.  As well as R anterior arm symptoms. Followed by R ulnar nerve/C8 dermatome symptoms to R medial elbow  Right rotation limited  Left rotation Limited and R anterior shoulder symptoms.    (Blank rows = not tested)  UPPER EXTREMITY ROM:  Active ROM Right eval Left eval  Shoulder flexion 70 with pain (95 degrees AAROM with pain, empty end feel)   Shoulder  extension    Shoulder abduction 62 with pain (83 degrees AAROM with pain, empty end feel)   Shoulder adduction    Shoulder extension    Shoulder internal rotation (functional) R thumb to R piriformis with R anterior shoulder and arm pain   Shoulder external rotation (functional) Unable to reach head, has R anterior shoulder pain.    Elbow flexion    Elbow extension    Wrist flexion    Wrist extension    Wrist ulnar deviation    Wrist radial deviation    Wrist pronation    Wrist supination     (Blank rows = not tested)  UPPER EXTREMITY MMT:  MMT Right eval Left eval  Shoulder flexion 3-   Shoulder extension    Shoulder abduction 3-   Shoulder adduction    Shoulder extension    Shoulder internal rotation 4- with anterior shoulder pain   Shoulder external rotation 4   Middle trapezius    Lower trapezius (seated manually resisted) 4- 4-  Elbow flexion 4 with anterior shoulder and arm pain with R 4th and 5th digit paresthesia   Elbow extension (arm at side, 90 degrees elbow flexion) 4/5 with R anterior shoulder pain.    Wrist flexion  Wrist extension    Wrist ulnar deviation    Wrist radial deviation    Wrist pronation    Wrist supination    Grip strength     (Blank rows = not tested)  CERVICAL SPECIAL TESTS:  No change in pain with R shoulder flexion and scapular retraction  Chin tuck increased R UE symptoms.   (+) empty can, Hawkin's kennedy test R shoulder    FUNCTIONAL TESTS:    TREATMENT DATE: 07/16/2023                                                                                                                                Therapeutic exercise  Seated self R shoulder inferior capsule stretch 10x5 seconds for 3 sets  Decreased R shoulder pain  Seated manually resisted R triceps extension isometrics 10x5 seconds for 2 sets  Decreased R shoulder pain  Seated R triceps and shoulder extension isometric with wrist on table 10x2 with 5 second holds to  promote posterior glide of humeral head.   Standing B shoulder ER yellow band 10x3  Standing "W's" yellow band at door 10x3  Standing pectoralis stretch at doorway   R 30 seconds x 5    Improved exercise technique, movement at target joints, use of target muscles after mod verbal, visual, tactile cues.     PATIENT EDUCATION:  Education details: POC Person educated: Patient Education method: Explanation Education comprehension: verbalized understanding  HOME EXERCISE PROGRAM: Access Code: [email or web address] URL: [email or web address] Date: 07/01/2023 Prepared by: Suzzane Estes  Exercises - Supine Head Nod with Deep Neck Flexor Activation  - 3 x daily - 7 x weekly - 3 sets - 10 reps - 5 seconds hold - Supine Shoulder Flexion Extension Full Range AROM  - 1 x daily - 7 x weekly - 3 sets - 10 reps - Seated Scapular Retraction  - 3 x daily - 7 x weekly - 3 sets - 10 reps - 5 seconds hold   Seated R first rib stretch with strap   With cervical rotation 10x3   Decreased R medial wrist pain when pressing up.   - Standing Shoulder Internal Rotation with Anchored Resistance  - 1 x daily - 7 x weekly - 3 sets - 10 reps - 5 seconds hold   - Seated Shoulder Inferior Glide  - 1 x daily - 7 x weekly - 3 sets - 10 reps - 5 seconds hold - Isometric Tricep Extension   - 1 x daily - 7 x weekly - 3 sets - 10 reps - 5  hold  - Single Arm Doorway Pec Stretch at 60 Elevation  - 3 x daily - 7 x weekly - 1 sets - 5 reps - 30 seconds hold   ASSESSMENT:  CLINICAL IMPRESSION: Decreased R shoulder pain with reaching to the side with treatment to promote inferior and posterior glide of R humeral head. Added R pectoralis stretch today to promote  scapular retraction and posterior tipping when raising her R arm. Pt tolerated session well without aggravation of symptoms.  Pt will benefit from continued skilled physical therapy services to decrease pain, improve strength, ROM, and function.     OBJECTIVE IMPAIRMENTS: decreased ROM, decreased strength, impaired UE functional use, improper body mechanics, postural dysfunction, and pain.   ACTIVITY LIMITATIONS: carrying, lifting, toileting, dressing, self feeding, reach over head, and hygiene/grooming  PARTICIPATION LIMITATIONS:   PERSONAL FACTORS: Fitness, Past/current experiences, Time since onset of injury/illness/exacerbation, and 3+ comorbidities: anxiety, arthritis, asthma, depression, DM, DJD are also affecting patient's functional outcome.   REHAB POTENTIAL: Fair    CLINICAL DECISION MAKING: Stable/uncomplicated  EVALUATION COMPLEXITY: Low   GOALS: Goals reviewed with patient? Yes  SHORT TERM GOALS: Target date: 07/05/2023  Pt will be independent with her initial HEP to decrease pain, improve ROM, strength, function, and ability to reach as well as don and doff clothes more comfortably.  Baseline:Pt has not yet started her initial HEP (06/24/2023)  Goal status: INITIAL  LONG TERM GOALS: Target date: 08/23/2023  Pt will have a decrease in R shoulder and arm pain to 5/10 or less at worst to promote ability to reach, don and doff clothes, lift, and carry more comfortably.  Baseline: 10/10 R shoulder and UE pain at worst (06/24/2023) Goal status: INITIAL  2.  Pt will improve her Upper Extremity Functional Index (UEFI) by at least 10 points as a demonstration of improved function.  Baseline: Upper Extremity Functional Index (UEFI): 32/80 (06/24/2023) Goal status: INITIAL  3.  Pt will improve her R shoulder flexion and abduction AROM to at least 110 degrees to promote ability to reach with less difficulty.  Baseline:  Active ROM Right eval  Shoulder flexion 70 with pain (95 degrees AAROM with pain, empty end feel)  Shoulder extension   Shoulder abduction 62 with pain (83 degrees AAROM with pain, empty end feel)   (06/24/2023)  Goal status: INITIAL  4.  Pt will improve her R shoulder functional IR to R thumb to T7  spinous process and functional R shoulder ER to 3rd digit to behind head to promote ability to don and doff clothes as well as perform self care/grooming more comfortably as well as with less difficulty  Baseline:  Active ROM Right eval  Shoulder internal rotation (functional) R thumb to R piriformis with R anterior shoulder and arm pain  Shoulder external rotation (functional) Unable to reach head, has R anterior shoulder pain.    (06/24/2023)  Goal status: INITIAL  5.  Pt will improve her R UE strength by at least 1/2 MMT to promote ability to use her R UE for functional tasks with less difficulty.  Baseline:  MMT Right eval  Shoulder flexion 3-  Shoulder extension   Shoulder abduction 3-  Shoulder adduction   Shoulder extension   Shoulder internal rotation 4- with anterior shoulder pain  Shoulder external rotation 4  Middle trapezius   Lower trapezius (seated manually resisted) 4-  Elbow flexion 4 with anterior shoulder and arm pain with R 4th and 5th digit paresthesia  Elbow extension (arm at side, 90 degrees elbow flexion) 4/5 with R anterior shoulder pain.    (06/24/2023)  Goal status: INITIAL     PLAN:  PT FREQUENCY: 1-2x/week  PT DURATION: 8 weeks  PLANNED INTERVENTIONS: 97110-Therapeutic exercises, 97530- Therapeutic activity, [id number]- Neuromuscular re-education, 97535- Self Care, [id number]- Manual therapy, [id number]- Electrical stimulation (unattended), 60454- Traction (mechanical), [id number]- Ionotophoresis 4mg /ml Dexamethasone , Patient/Family education, Dry  Needling, Joint mobilization, Joint manipulation, Spinal manipulation, and Spinal mobilization  PLAN FOR NEXT SESSION: posture, scapular strengthening, R shoulder ROM and strengthening, manual techniques, modalities PRN   Acquanetta Cabanilla, PT, DPT 07/16/2023, 11:32 AM

## 2023-07-18 ENCOUNTER — Other Ambulatory Visit: Payer: Self-pay | Admitting: Family Medicine

## 2023-07-18 ENCOUNTER — Ambulatory Visit: Payer: PRIVATE HEALTH INSURANCE

## 2023-07-18 DIAGNOSIS — M25511 Pain in right shoulder: Secondary | ICD-10-CM

## 2023-07-18 DIAGNOSIS — M5412 Radiculopathy, cervical region: Secondary | ICD-10-CM

## 2023-07-18 DIAGNOSIS — E538 Deficiency of other specified B group vitamins: Secondary | ICD-10-CM

## 2023-07-18 NOTE — Therapy (Signed)
 OUTPATIENT PHYSICAL THERAPY TREATMENT  Patient Name: Claire [name] MRN: [id number] DOB:1970/11/25, 53 y.o., female Today's Date: 07/18/2023  END OF SESSION:  PT End of Session - 07/18/23 1519     Visit Number 6    Number of Visits 17    Date for PT Re-Evaluation 08/23/23    PT Start Time 1519    PT Stop Time 1559    PT Time Calculation (min) 40 min    Activity Tolerance Patient tolerated treatment well;Patient limited by pain    Behavior During Therapy [facility] for tasks assessed/performed                  Past Medical History:  Diagnosis Date   Anemia    Anxiety    Arthritis    hands, hips   Asthma    Bilateral leg cramps    Depression    Diabetes mellitus without complication (HCC)    history no longer a problem since weight loss surgery   DJD (degenerative joint disease)    Dyspnea on exertion    a. 07/2019 Echo: EF 60-65%, no rwma, nl RV fxn, triv MR.   GERD (gastroesophageal reflux disease)    Headache    Migraines   IBS (irritable bowel syndrome)    Kidney stone    Low serum vitamin B12    Lung nodules    Migraine    down to approx 4x/mo since starting meds   Muscle spasm    Nonobstructive CAD (coronary artery disease)    a. 06/2014 ETT: Ex time 8:59, max HR 155, 10.1 METS, no ST/T changes; b. 08/2019 Cor CTA: Ca2+ 175 (99th %'ile), LAD 35-49p, LCX 0-26m-->Med rx.   Psoriasis    vaginal area   Seasonal allergies    Sleep apnea    history no longer a problem since weight loss surgery   Vasovagal syncope    [place], [name]    Vitamin D  deficiency    Past Surgical History:  Procedure Laterality Date   BLADDER SURGERY  2010   BREAST BIOPSY Right 2014   stereotatic biopsy   CHOLECYSTECTOMY  2010   COLONOSCOPY  2014    Done at [facility]   COLONOSCOPY WITH PROPOFOL  N/A 01/28/2018   Procedure: COLONOSCOPY WITH PROPOFOL ;  Surgeon: Irby Mannan, MD;  Location: [facility];  Service: Endoscopy;  Laterality: N/A;   DILITATION &  CURRETTAGE/HYSTROSCOPY WITH NOVASURE ABLATION N/A 06/16/2015   Procedure: DILATATION & CURETTAGE/HYSTEROSCOPY WITH NOVASURE ABLATION;  Surgeon: Meriam Stamp, MD;  Location: [facility];  Service: Gynecology;  Laterality: N/A;   ESOPHAGOGASTRODUODENOSCOPY (EGD) WITH PROPOFOL  N/A 01/28/2018   Procedure: ESOPHAGOGASTRODUODENOSCOPY (EGD) WITH BIOPSIES;  Surgeon: Irby Mannan, MD;  Location: [facility];  Service: Endoscopy;  Laterality: N/A;   GASTRIC ROUX-EN-Y N/A 11/03/2019   Procedure: LAPAROSCOPIC 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 WITH UPPER ENDOSCOPY;  Surgeon: Jacolyn Matar, MD;  Location: [facility];  Service: General;  Laterality: N/A;   HIATAL HERNIA REPAIR N/A 11/03/2019   Procedure: HERNIA REPAIR HIATAL;  Surgeon: Jacolyn Matar, MD;  Location: [facility];  Service: General;  Laterality: N/A;   LAPAROSCOPIC GASTRIC SLEEVE RESECTION  2012   LAPAROSCOPY N/A 06/16/2015   Procedure: LAPAROSCOPY DIAGNOSTIC;  Surgeon: Meriam Stamp, MD;  Location: [facility];  Service: Gynecology;  Laterality: N/A;   LAPAROSCOPY N/A 04/01/2018   Procedure: LAPAROSCOPY DIAGNOSTIC ERAS PATHWAY ENTEROLYSIS, CECOPEXY;  Surgeon: Jacolyn Matar, MD;  Location: [facility];  Service: General;  Laterality: N/A;  LITHOTRIPSY  12/11/2017   laser lithotripsy   LYSIS OF ADHESION N/A 06/16/2015   Procedure: LYSIS OF ADHESION;  Surgeon: Meriam Stamp, MD;  Location: [facility];  Service: Gynecology;  Laterality: N/A;   PLANTAR FASCIA SURGERY Bilateral    ROBOTIC ASSISTED SALPINGO OOPHERECTOMY Right 06/16/2015   Procedure: ROBOTIC ASSISTED SALPINGO OOPHORECTOMY, EXCISION OF RIGHT CUL DE SAC MASS;  Surgeon: Meriam Stamp, MD;  Location: [facility];  Service: Gynecology;  Laterality: Right;   TONSILLECTOMY     UPPER GI ENDOSCOPY     Patient Active Problem List   Diagnosis Date Noted   Spondylolisthesis of lumbar region 01/25/2023   Morbid obesity with BMI of 40.0-44.9, adult (HCC) 07/10/2021   ADD  (attention deficit disorder) 07/10/2021   Type 2 diabetes mellitus with obesity (HCC) 07/10/2021   History of thyroiditis 07/10/2021   Fibromyalgia 07/10/2021   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/03/2019   B12 deficiency 04/10/2018   Columnar epithelial-lined lower esophagus    Postprocedural disorder of digestive system    Esophageal dysphagia    Persistent mood (affective) disorder, unspecified (HCC) 01/15/2018   Insomnia related to another mental disorder 01/15/2018   OCD (obsessive compulsive disorder) 10/29/2017   GAD (generalized anxiety disorder) 10/29/2017   Nephrolithiasis 10/22/2017   Atherosclerosis of aorta (HCC) 12/21/2016   Hiatal hernia 08/17/2016   Diverticulosis of colon 08/17/2016   Lumbosacral pain 01/07/2015   Intertrigo 11/05/2014   Perennial allergic rhinitis 09/03/2014   Lung nodules 09/03/2014   Vitamin D  deficiency 09/03/2014   History of kidney stones 09/03/2014   History of iron deficiency anemia 09/03/2014   Mild persistent asthma without complication 11/21/2012   GERD without esophagitis 11/21/2012   History of hyperlipidemia 11/21/2012   Migraine with aura and without status migrainosus 11/21/2012   History of sleep apnea 11/21/2012   IBS (irritable bowel syndrome) 08/15/2012    PCP: Arleen Lacer, MD   REFERRING PROVIDER: Saddie Crane, DO  REFERRING DIAG: M25.511 (ICD-10-CM) - Pain in right shoulder G89.29 (ICD-10-CM) - Other chronic pain M75.41 (ICD-10-CM) - Impingement syndrome of right shoulder  THERAPY DIAG:  Right shoulder pain, unspecified chronicity  Radiculopathy, cervical region  Rationale for Evaluation and Treatment: Rehabilitation  ONSET DATE: February 2025  SUBJECTIVE:  SUBJECTIVE STATEMENT: R shoulder is doing ok, 2/10 currently. About the same as it was the other day. Forgot to take her fibromyalgia pain medicine this morning. Reaching across is getting better.       Hand dominance: Right  PERTINENT HISTORY:  R shoulder pain. Was placed in a sling which made her pain worse. Has to use her R UE a lot. 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 Pt tried to lift up the window to open it for a client and heard a pop or a snap in her shoulder. Pain then radiated to her R arm to her elbow. Pt also states that she has fibromyalgia and gets referred pain. Got a steroid injection last Tuesday 06/18/2023 which helped her R shoulder but still has pain in her R arm. Was a 10/10 prior to the cortisone shot. Has 2 tears in her shoulder which have not retracted.   April 19, 2023 pt caught her client and kept her from falling as the client was trying to stand up from a chair, and pt slid her back to her bed (to her R, pt was on the client's L side). Has a TENS unit which kind of helped. Also tried ice and heat.   Was told that if PT does not work then pt has to see a Careers adviser.   No latex allergies No blood pressure problems per pt.     PAIN:  Are you having pain? Yes: NPRS scale: 4/10 (resting position) Pain location: R shoulder, arm, and elbow Pain description: strong ache Aggravating factors: reaching up, donning and doffing clothes, reaching across, reaching behind her back.  Relieving factors: propping her R arm onto a pillow in slight flexion/abduction.   PRECAUTIONS: No known precautions  RED FLAGS: Bowel or bladder incontinence: No and Cauda equina syndrome: No     WEIGHT BEARING RESTRICTIONS: No  FALLS:  Has patient fallen in last 6 months? 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   PLOF: Independent  PATIENT GOALS: Try to prevent surgery and decrease pain.   NEXT MD VISIT: none yet  OBJECTIVE:  Note: Objective measures were completed at Evaluation unless otherwise noted.  DIAGNOSTIC FINDINGS:  MR SHOULDER RIGHT WO CONTRAST 06/01/2023  Narrative & Impression  CLINICAL DATA:  Right shoulder pain for 2.5 months.  Lifting injury.   EXAM: MRI OF THE RIGHT SHOULDER WITHOUT CONTRAST   TECHNIQUE: Multiplanar, multisequence MR imaging of the shoulder was performed. No intravenous contrast was administered.   COMPARISON:  None Available.   FINDINGS: Rotator cuff: There is a partial-thickness bursal sided tear of a mid AP dimension of the supraspinatus tendon measuring up to 4 mm in transverse dimension (coronal images 15 and 16) and 8 mm in AP dimension (sagittal image 7). The posterior aspect of this tear is smaller and located within the midsubstance of the tendon (coronal image 15). No significant tendon retraction. Mild-to-moderate underlying intermediate T2 signal posterior supraspinatus and anterior infraspinatus tendinosis. The subscapularis and teres minor are intact.   Muscles: No rotator cuff muscle atrophy, fatty infiltration, or edema.   Biceps long head: The intra-articular long head of the biceps tendon is intact.  Acromioclavicular Joint: There are mild degenerative changes of the acromioclavicular joint including joint space narrowing, subchondral marrow edema, and peripheral osteophytosis. Type I acromion. Trace fluid within the subacromial/subdeltoid bursa.   Glenohumeral Joint: Mild-to-moderate inferior anterior humeral head and mid glenoid cartilage thinning. No glenohumeral joint effusion.   Labrum: Grossly  intact, but evaluation is limited by lack of intraarticular fluid.   Bones:  No acute fracture.   Other: None.   IMPRESSION: 1. Partial-thickness bursal sided tear of the mid AP dimension of the supraspinatus tendon measuring up to 4 mm in transverse dimension and 8 mm in AP dimension. No significant tendon retraction. Mild-to-moderate underlying posterior supraspinatus and anterior infraspinatus tendinosis. 2. Mild degenerative changes of the acromioclavicular joint. 3. Mild-to-moderate inferior anterior humeral head and mid glenoid cartilage thinning.     Electronically Signed   By: Bertina Broccoli M.D.   On: 06/09/2023 12:19       PATIENT SURVEYS:  Upper Extremity Functional Index (UEFI): 32/80  COGNITION: Overall cognitive status: Within functional limits for tasks assessed  SENSATION:   POSTURE: Forward neck, movement preference around C5/C6 area, B protracted shoulders, L lateral shift with L trunk rotation.  PALPATION: TTP R first rib area, AC joint, acromion process anterior and lateral TTP R anterior and anterior medial arm TTP R lateral arm    Muscle tension R upper trap.    CERVICAL ROM:   Active ROM A/PROM (deg) eval  Flexion WFL with R upper trap and anterior lateral shoulder and arm symptoms (worse than extension)  Extension Limited with R upper trap and anterior lateral shoulder and arm symptoms.   Right lateral flexion WFL with L lateral neck tenderness  Left lateral flexion WFL with R upper trap and anterior lateral shoulder and arm symptoms.  As well as R anterior arm symptoms. Followed by R ulnar nerve/C8 dermatome symptoms to R medial elbow  Right rotation limited  Left rotation Limited and R anterior shoulder symptoms.    (Blank rows = not tested)  UPPER EXTREMITY ROM:  Active ROM Right eval Left eval  Shoulder flexion 70 with pain (95 degrees AAROM with pain, empty end feel)   Shoulder extension    Shoulder abduction 62 with pain (83  degrees AAROM with pain, empty end feel)   Shoulder adduction    Shoulder extension    Shoulder internal rotation (functional) R thumb to R piriformis with R anterior shoulder and arm pain   Shoulder external rotation (functional) Unable to reach head, has R anterior shoulder pain.    Elbow flexion    Elbow extension    Wrist flexion    Wrist extension    Wrist ulnar deviation    Wrist radial deviation    Wrist pronation    Wrist supination     (Blank rows = not tested)  UPPER EXTREMITY MMT:  MMT Right eval Left eval  Shoulder flexion 3-   Shoulder extension    Shoulder abduction 3-   Shoulder adduction    Shoulder extension    Shoulder internal rotation 4- with anterior shoulder pain   Shoulder external rotation 4   Middle trapezius    Lower trapezius (seated manually resisted) 4- 4-  Elbow flexion 4 with anterior shoulder and arm pain with R 4th and 5th digit paresthesia   Elbow extension (arm at side, 90 degrees elbow flexion) 4/5 with R anterior shoulder pain.    Wrist flexion    Wrist extension    Wrist ulnar deviation  Wrist radial deviation    Wrist pronation    Wrist supination    Grip strength     (Blank rows = not tested)  CERVICAL SPECIAL TESTS:  No change in pain with R shoulder flexion and scapular retraction  Chin tuck increased R UE symptoms.   (+) empty can, Hawkin's kennedy test R shoulder    FUNCTIONAL TESTS:    TREATMENT DATE: 07/18/2023                                                                                                                                Manual therapy Supine with R shoulder in abduction at 90 degrees  Inferior glide at joint capsule, grade 3- to 3 to promote mobility   Decreased R shoulder pain  Seated STM R upper trap muscle to decrease tension.    Therapeutic exercise  Seated self R shoulder inferior capsule stretch 10x10 seconds   Decreased R shoulder pain  Standing B shoulder ER yellow band 10x2 with  5 second holds    Decreased R shoulder pain  Wall push-up 10x3  Triceps extension isometrics, hand resistance on door knob  10x5 seconds  Decreased R shoulder pain when reaching across  Supine PNF D2 flexion yellow band at comfortable range of motion  R 10x3     Improved exercise technique, movement at target joints, use of target muscles after mod verbal, visual, tactile cues.     PATIENT EDUCATION:  Education details: POC Person educated: Patient Education method: Explanation Education comprehension: verbalized understanding  HOME EXERCISE PROGRAM: Access Code: [email or web address] URL: [email or web address] Date: 07/01/2023 Prepared by: Suzzane Estes  Exercises - Supine Head Nod with Deep Neck Flexor Activation  - 3 x daily - 7 x weekly - 3 sets - 10 reps - 5 seconds hold - Supine Shoulder Flexion Extension Full Range AROM  - 1 x daily - 7 x weekly - 3 sets - 10 reps - Seated Scapular Retraction  - 3 x daily - 7 x weekly - 3 sets - 10 reps - 5 seconds hold   Seated R first rib stretch with strap   With cervical rotation 10x3   Decreased R medial wrist pain when pressing up.   - Standing Shoulder Internal Rotation with Anchored Resistance  - 1 x daily - 7 x weekly - 3 sets - 10 reps - 5 seconds hold   - Seated Shoulder Inferior Glide  - 1 x daily - 7 x weekly - 3 sets - 10 reps - 5 seconds hold - Isometric Tricep Extension   - 1 x daily - 7 x weekly - 3 sets - 10 reps - 5  hold  - Single Arm Doorway Pec Stretch at 60 Elevation  - 3 x daily - 7 x weekly - 1 sets - 5 reps - 30 seconds hold - Standing Shoulder External Rotation with Resistance  - 1 x daily - 3-4 x weekly -  2 sets - 10 reps - 5 seconds hold  Yellow band.    ASSESSMENT:  CLINICAL IMPRESSION: Continued working on improving inferior R glenohumeral joint mobility as well as posterior shoulder strengthening to promote posterior and inferior glide of R humeral head to decrease anterior shoulder  impingement. Decreased pain reported after treatment. Pt will benefit from continued skilled physical therapy services to decrease pain, improve strength, ROM, and function.     OBJECTIVE IMPAIRMENTS: decreased ROM, decreased strength, impaired UE functional use, improper body mechanics, postural dysfunction, and pain.   ACTIVITY LIMITATIONS: carrying, lifting, toileting, dressing, self feeding, reach over head, and hygiene/grooming  PARTICIPATION LIMITATIONS:   PERSONAL FACTORS: Fitness, Past/current experiences, Time since onset of injury/illness/exacerbation, and 3+ comorbidities: anxiety, arthritis, asthma, depression, DM, DJD are also affecting patient's functional outcome.   REHAB POTENTIAL: Fair    CLINICAL DECISION MAKING: Stable/uncomplicated  EVALUATION COMPLEXITY: Low   GOALS: Goals reviewed with patient? Yes  SHORT TERM GOALS: Target date: 07/05/2023  Pt will be independent with her initial HEP to decrease pain, improve ROM, strength, function, and ability to reach as well as don and doff clothes more comfortably.  Baseline:Pt has not yet started her initial HEP (06/24/2023)  Goal status: INITIAL  LONG TERM GOALS: Target date: 08/23/2023  Pt will have a decrease in R shoulder and arm pain to 5/10 or less at worst to promote ability to reach, don and doff clothes, lift, and carry more comfortably.  Baseline: 10/10 R shoulder and UE pain at worst (06/24/2023) Goal status: INITIAL  2.  Pt will improve her Upper Extremity Functional Index (UEFI) by at least 10 points as a demonstration of improved function.  Baseline: Upper Extremity Functional Index (UEFI): 32/80 (06/24/2023) Goal status: INITIAL  3.  Pt will improve her R shoulder flexion and abduction AROM to at least 110 degrees to promote ability to reach with less difficulty.  Baseline:  Active ROM Right eval  Shoulder flexion 70 with pain (95 degrees AAROM with pain, empty end feel)  Shoulder extension   Shoulder  abduction 62 with pain (83 degrees AAROM with pain, empty end feel)   (06/24/2023)  Goal status: INITIAL  4.  Pt will improve her R shoulder functional IR to R thumb to T7 spinous process and functional R shoulder ER to 3rd digit to behind head to promote ability to don and doff clothes as well as perform self care/grooming more comfortably as well as with less difficulty  Baseline:  Active ROM Right eval  Shoulder internal rotation (functional) R thumb to R piriformis with R anterior shoulder and arm pain  Shoulder external rotation (functional) Unable to reach head, has R anterior shoulder pain.    (06/24/2023)  Goal status: INITIAL  5.  Pt will improve her R UE strength by at least 1/2 MMT to promote ability to use her R UE for functional tasks with less difficulty.  Baseline:  MMT Right eval  Shoulder flexion 3-  Shoulder extension   Shoulder abduction 3-  Shoulder adduction   Shoulder extension   Shoulder internal rotation 4- with anterior shoulder pain  Shoulder external rotation 4  Middle trapezius   Lower trapezius (seated manually resisted) 4-  Elbow flexion 4 with anterior shoulder and arm pain with R 4th and 5th digit paresthesia  Elbow extension (arm at side, 90 degrees elbow flexion) 4/5 with R anterior shoulder pain.    (06/24/2023)  Goal status: INITIAL     PLAN:  PT FREQUENCY: 1-2x/week  PT DURATION: 8 weeks  PLANNED INTERVENTIONS: 97110-Therapeutic exercises, 97530- Therapeutic activity, 97112- Neuromuscular re-education, 97535- Self Care, [id number]- Manual therapy, [id number]- Electrical stimulation (unattended), [phone]- Traction (mechanical), [phone]- Ionotophoresis 4mg /ml Dexamethasone , Patient/Family education, Dry Needling, Joint mobilization, Joint manipulation, Spinal manipulation, and Spinal mobilization  PLAN FOR NEXT SESSION: posture, scapular strengthening, R shoulder ROM and strengthening, manual techniques, modalities PRN   Oney Tatlock, PT,  DPT 07/18/2023, 4:08 PM

## 2023-07-23 ENCOUNTER — Ambulatory Visit: Payer: PRIVATE HEALTH INSURANCE

## 2023-07-25 ENCOUNTER — Ambulatory Visit: Payer: PRIVATE HEALTH INSURANCE

## 2023-07-26 ENCOUNTER — Other Ambulatory Visit: Payer: Self-pay | Admitting: Sports Medicine

## 2023-07-26 DIAGNOSIS — M778 Other enthesopathies, not elsewhere classified: Secondary | ICD-10-CM

## 2023-07-26 DIAGNOSIS — M25431 Effusion, right wrist: Secondary | ICD-10-CM

## 2023-07-26 DIAGNOSIS — M25531 Pain in right wrist: Secondary | ICD-10-CM

## 2023-07-30 ENCOUNTER — Ambulatory Visit: Payer: PRIVATE HEALTH INSURANCE | Attending: Sports Medicine

## 2023-07-30 ENCOUNTER — Ambulatory Visit
Admission: RE | Admit: 2023-07-30 | Discharge: 2023-07-30 | Disposition: A | Discharge status: Home / Self Care | Payer: PRIVATE HEALTH INSURANCE | Source: Ambulatory Visit | Attending: Sports Medicine | Admitting: Sports Medicine

## 2023-07-30 DIAGNOSIS — M5412 Radiculopathy, cervical region: Secondary | ICD-10-CM | POA: Insufficient documentation

## 2023-07-30 DIAGNOSIS — M778 Other enthesopathies, not elsewhere classified: Secondary | ICD-10-CM | POA: Insufficient documentation

## 2023-07-30 DIAGNOSIS — M25431 Effusion, right wrist: Secondary | ICD-10-CM | POA: Insufficient documentation

## 2023-07-30 DIAGNOSIS — M25511 Pain in right shoulder: Secondary | ICD-10-CM | POA: Diagnosis present

## 2023-07-30 DIAGNOSIS — M25531 Pain in right wrist: Secondary | ICD-10-CM | POA: Insufficient documentation

## 2023-07-30 NOTE — Therapy (Signed)
 OUTPATIENT PHYSICAL THERAPY TREATMENT  Patient Name: Claire [name] MRN: [id number] DOB:07-21-1970, 53 y.o., female Today's Date: 07/30/2023  END OF SESSION:  PT End of Session - 07/30/23 1123     Visit Number 7    Number of Visits 17    Date for PT Re-Evaluation 08/23/23    PT Start Time 1124   pt arrived late   PT Stop Time 1203    PT Time Calculation (min) 39 min    Activity Tolerance Patient tolerated treatment well    Behavior During Therapy [facility] for tasks assessed/performed                   Past Medical History:  Diagnosis Date   Anemia    Anxiety    Arthritis    hands, hips   Asthma    Bilateral leg cramps    Depression    Diabetes mellitus without complication (HCC)    history no longer a problem since weight loss surgery   DJD (degenerative joint disease)    Dyspnea on exertion    a. 07/2019 Echo: EF 60-65%, no rwma, nl RV fxn, triv MR.   GERD (gastroesophageal reflux disease)    Headache    Migraines   IBS (irritable bowel syndrome)    Kidney stone    Low serum vitamin B12    Lung nodules    Migraine    down to approx 4x/mo since starting meds   Muscle spasm    Nonobstructive CAD (coronary artery disease)    a. 06/2014 ETT: Ex time 8:59, max HR 155, 10.1 METS, no ST/T changes; b. 08/2019 Cor CTA: Ca2+ 175 (99th %'ile), LAD 35-49p, LCX 0-74m-->Med rx.   Psoriasis    vaginal area   Seasonal allergies    Sleep apnea    history no longer a problem since weight loss surgery   Vasovagal syncope    [place], [name]    Vitamin D  deficiency    Past Surgical History:  Procedure Laterality Date   BLADDER SURGERY  2010   BREAST BIOPSY Right 2014   stereotatic biopsy   CHOLECYSTECTOMY  2010   COLONOSCOPY  2014    Done at [facility]   COLONOSCOPY WITH PROPOFOL  N/A 01/28/2018   Procedure: COLONOSCOPY WITH PROPOFOL ;  Surgeon: Irby Mannan, MD;  Location: [facility];  Service: Endoscopy;  Laterality: N/A;   DILITATION &  CURRETTAGE/HYSTROSCOPY WITH NOVASURE ABLATION N/A 06/16/2015   Procedure: DILATATION & CURETTAGE/HYSTEROSCOPY WITH NOVASURE ABLATION;  Surgeon: Meriam Stamp, MD;  Location: [facility];  Service: Gynecology;  Laterality: N/A;   ESOPHAGOGASTRODUODENOSCOPY (EGD) WITH PROPOFOL  N/A 01/28/2018   Procedure: ESOPHAGOGASTRODUODENOSCOPY (EGD) WITH BIOPSIES;  Surgeon: Irby Mannan, MD;  Location: [facility];  Service: Endoscopy;  Laterality: N/A;   GASTRIC ROUX-EN-Y N/A 11/03/2019   Procedure: LAPAROSCOPIC 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 WITH UPPER ENDOSCOPY;  Surgeon: Jacolyn Matar, MD;  Location: [facility];  Service: General;  Laterality: N/A;   HIATAL HERNIA REPAIR N/A 11/03/2019   Procedure: HERNIA REPAIR HIATAL;  Surgeon: Jacolyn Matar, MD;  Location: [facility];  Service: General;  Laterality: N/A;   LAPAROSCOPIC GASTRIC SLEEVE RESECTION  2012   LAPAROSCOPY N/A 06/16/2015   Procedure: LAPAROSCOPY DIAGNOSTIC;  Surgeon: Meriam Stamp, MD;  Location: [facility];  Service: Gynecology;  Laterality: N/A;   LAPAROSCOPY N/A 04/01/2018   Procedure: LAPAROSCOPY DIAGNOSTIC ERAS PATHWAY ENTEROLYSIS, CECOPEXY;  Surgeon: Jacolyn Matar, MD;  Location: [facility];  Service: General;  Laterality: N/A;   LITHOTRIPSY  12/11/2017   laser lithotripsy   LYSIS OF ADHESION N/A 06/16/2015   Procedure: LYSIS OF ADHESION;  Surgeon: Meriam Stamp, MD;  Location: [facility];  Service: Gynecology;  Laterality: N/A;   PLANTAR FASCIA SURGERY Bilateral    ROBOTIC ASSISTED SALPINGO OOPHERECTOMY Right 06/16/2015   Procedure: ROBOTIC ASSISTED SALPINGO OOPHORECTOMY, EXCISION OF RIGHT CUL DE SAC MASS;  Surgeon: Meriam Stamp, MD;  Location: [facility];  Service: Gynecology;  Laterality: Right;   TONSILLECTOMY     UPPER GI ENDOSCOPY     Patient Active Problem List   Diagnosis Date Noted   Spondylolisthesis of lumbar region 01/25/2023   Morbid obesity with BMI of 40.0-44.9, adult (HCC) 07/10/2021   ADD  (attention deficit disorder) 07/10/2021   Type 2 diabetes mellitus with obesity (HCC) 07/10/2021   History of thyroiditis 07/10/2021   Fibromyalgia 07/10/2021   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/03/2019   B12 deficiency 04/10/2018   Columnar epithelial-lined lower esophagus    Postprocedural disorder of digestive system    Esophageal dysphagia    Persistent mood (affective) disorder, unspecified (HCC) 01/15/2018   Insomnia related to another mental disorder 01/15/2018   OCD (obsessive compulsive disorder) 10/29/2017   GAD (generalized anxiety disorder) 10/29/2017   Nephrolithiasis 10/22/2017   Atherosclerosis of aorta (HCC) 12/21/2016   Hiatal hernia 08/17/2016   Diverticulosis of colon 08/17/2016   Lumbosacral pain 01/07/2015   Intertrigo 11/05/2014   Perennial allergic rhinitis 09/03/2014   Lung nodules 09/03/2014   Vitamin D  deficiency 09/03/2014   History of kidney stones 09/03/2014   History of iron deficiency anemia 09/03/2014   Mild persistent asthma without complication 11/21/2012   GERD without esophagitis 11/21/2012   History of hyperlipidemia 11/21/2012   Migraine with aura and without status migrainosus 11/21/2012   History of sleep apnea 11/21/2012   IBS (irritable bowel syndrome) 08/15/2012    PCP: Arleen Lacer, MD   REFERRING PROVIDER: Saddie Crane, DO  REFERRING DIAG: M25.511 (ICD-10-CM) - Pain in right shoulder G89.29 (ICD-10-CM) - Other chronic pain M75.41 (ICD-10-CM) - Impingement syndrome of right shoulder  THERAPY DIAG:  Right shoulder pain, unspecified chronicity  Radiculopathy, cervical region  Rationale for Evaluation and Treatment: Rehabilitation  ONSET DATE: February 2025  SUBJECTIVE:  SUBJECTIVE STATEMENT: R shoulder is great. The R anterior lateral arm pain bothers her. Better able to move her R arm overall since starting PT. 1/10 R shoulder pain when raising her arm up currently. Pt states R shoulder is doing great.     Hand dominance: Right  PERTINENT HISTORY:  R shoulder pain. Was placed in a sling which made her pain worse. Has to use her R UE a lot. 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 Pt tried to lift up the window to open it for a client and heard a pop or a snap in her shoulder. Pain then radiated to her R arm to her elbow. Pt also states that she has fibromyalgia and gets referred pain. Got a steroid injection last Tuesday 06/18/2023 which helped her R shoulder but still has pain in her R arm. Was a 10/10 prior to the cortisone shot. Has 2 tears in her shoulder which have not retracted.   April 19, 2023 pt caught her client and kept her from falling as the client was trying to stand up from a chair, and pt slid her back to her bed (to her R, pt was on the client's L side). Has a TENS unit which kind of helped. Also tried ice and heat.   Was told that if PT does not work then pt has to see a Careers adviser.   No latex allergies No blood pressure problems per pt.     PAIN:  Are you having pain? Yes: NPRS scale: 4/10 (resting position) Pain location: R shoulder, arm, and elbow Pain description: strong ache Aggravating factors: reaching up, donning and doffing clothes, reaching across, reaching behind her back.  Relieving factors: propping her R arm onto a pillow in slight flexion/abduction.   PRECAUTIONS: No known precautions  RED FLAGS: Bowel or bladder incontinence: No and Cauda equina syndrome: No     WEIGHT BEARING RESTRICTIONS: No  FALLS:  Has patient fallen in last 6 months? 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   PLOF: Independent  PATIENT GOALS: Try to prevent surgery and decrease pain.   NEXT MD VISIT: none yet  OBJECTIVE:  Note: Objective measures were completed at Evaluation unless otherwise noted.  DIAGNOSTIC FINDINGS:  MR SHOULDER RIGHT WO CONTRAST 06/01/2023  Narrative & Impression  CLINICAL DATA:  Right shoulder pain for 2.5 months.  Lifting injury.   EXAM: MRI OF THE RIGHT SHOULDER WITHOUT CONTRAST   TECHNIQUE: Multiplanar, multisequence MR imaging of the shoulder was performed. No intravenous contrast was administered.   COMPARISON:  None Available.   FINDINGS: Rotator cuff: There is a partial-thickness bursal sided tear of a mid AP dimension of the supraspinatus tendon measuring up to 4 mm in transverse dimension (coronal images 15 and 16) and 8 mm in AP dimension (sagittal image 7). The posterior aspect of this tear is smaller and located within the midsubstance of the tendon (coronal image 15). No significant tendon retraction. Mild-to-moderate underlying intermediate T2 signal posterior supraspinatus and anterior infraspinatus tendinosis. The subscapularis and teres minor are intact.   Muscles: No rotator cuff muscle atrophy, fatty infiltration, or edema.   Biceps long head: The intra-articular long  head of the biceps tendon is intact.   Acromioclavicular Joint: There are mild degenerative changes of the acromioclavicular joint including joint space narrowing, subchondral marrow edema, and peripheral osteophytosis. Type I acromion. Trace fluid within the subacromial/subdeltoid bursa.   Glenohumeral Joint: Mild-to-moderate inferior anterior humeral head and mid glenoid cartilage thinning. No glenohumeral  joint effusion.   Labrum: Grossly intact, but evaluation is limited by lack of intraarticular fluid.   Bones:  No acute fracture.   Other: None.   IMPRESSION: 1. Partial-thickness bursal sided tear of the mid AP dimension of the supraspinatus tendon measuring up to 4 mm in transverse dimension and 8 mm in AP dimension. No significant tendon retraction. Mild-to-moderate underlying posterior supraspinatus and anterior infraspinatus tendinosis. 2. Mild degenerative changes of the acromioclavicular joint. 3. Mild-to-moderate inferior anterior humeral head and mid glenoid cartilage thinning.     Electronically Signed   By: Bertina Broccoli M.D.   On: 06/09/2023 12:19       PATIENT SURVEYS:  Upper Extremity Functional Index (UEFI): 32/80  COGNITION: Overall cognitive status: Within functional limits for tasks assessed  SENSATION:   POSTURE: Forward neck, movement preference around C5/C6 area, B protracted shoulders, L lateral shift with L trunk rotation.  PALPATION: TTP R first rib area, AC joint, acromion process anterior and lateral TTP R anterior and anterior medial arm TTP R lateral arm    Muscle tension R upper trap.    CERVICAL ROM:   Active ROM A/PROM (deg) eval  Flexion WFL with R upper trap and anterior lateral shoulder and arm symptoms (worse than extension)  Extension Limited with R upper trap and anterior lateral shoulder and arm symptoms.   Right lateral flexion WFL with L lateral neck tenderness  Left lateral flexion WFL with R upper trap and anterior lateral shoulder and arm symptoms.  As well as R anterior arm symptoms. Followed by R ulnar nerve/C8 dermatome symptoms to R medial elbow  Right rotation limited  Left rotation Limited and R anterior shoulder symptoms.    (Blank rows = not tested)  UPPER EXTREMITY ROM:  Active ROM Right eval Left eval  Shoulder flexion 70 with pain (95 degrees AAROM with pain, empty end feel)   Shoulder extension     Shoulder abduction 62 with pain (83 degrees AAROM with pain, empty end feel)   Shoulder adduction    Shoulder extension    Shoulder internal rotation (functional) R thumb to R piriformis with R anterior shoulder and arm pain   Shoulder external rotation (functional) Unable to reach head, has R anterior shoulder pain.    Elbow flexion    Elbow extension    Wrist flexion    Wrist extension    Wrist ulnar deviation    Wrist radial deviation    Wrist pronation    Wrist supination     (Blank rows = not tested)  UPPER EXTREMITY MMT:  MMT Right eval Left eval  Shoulder flexion 3-   Shoulder extension    Shoulder abduction 3-   Shoulder adduction    Shoulder extension    Shoulder internal rotation 4- with anterior shoulder pain   Shoulder external rotation 4   Middle trapezius    Lower trapezius (seated manually resisted) 4- 4-  Elbow flexion 4 with anterior shoulder and arm pain with R 4th and 5th digit paresthesia   Elbow extension (arm at side, 90 degrees elbow flexion) 4/5 with R anterior shoulder pain.    Wrist flexion  Wrist extension    Wrist ulnar deviation    Wrist radial deviation    Wrist pronation    Wrist supination    Grip strength     (Blank rows = not tested)  CERVICAL SPECIAL TESTS:  No change in pain with R shoulder flexion and scapular retraction  Chin tuck increased R UE symptoms.   (+) empty can, Hawkin's kennedy test R shoulder    FUNCTIONAL TESTS:    TREATMENT DATE: 07/30/2023                                                                                                                                Therapeutic exercise  Seated manually resisted scapular retraction targeting the lower trap muscle  R 10x3 with 5 second holds  Reviewed HEP  Seated scapular depression isometrics, forearm on arm rest 10x3 with 5 second holds  Supine cervical nod with B scapular retraction 10x5 seconds for 2 sets  Performed secondary to pt forgetting to  do this exercise at home.    Seated self R shoulder inferior capsule stretch 10x10 seconds      Improved exercise technique, movement at target joints, use of target muscles after mod verbal, visual, tactile cues.   Manual therapy  Seated STM R cervical paraspinal and upper trap muscle to decrease tension.         PATIENT EDUCATION:  Education details: POC Person educated: Patient Education method: Explanation Education comprehension: verbalized understanding  HOME EXERCISE PROGRAM: Access Code: [email or web address] URL: [email or web address] Date: 07/01/2023 Prepared by: Suzzane Estes  Exercises - Supine Head Nod with Deep Neck Flexor Activation  - 3 x daily - 7 x weekly - 3 sets - 10 reps - 5 seconds hold - Supine Shoulder Flexion Extension Full Range AROM  - 1 x daily - 7 x weekly - 3 sets - 10 reps - Seated Scapular Retraction  - 3 x daily - 7 x weekly - 3 sets - 10 reps - 5 seconds hold   Seated R first rib stretch with strap   With cervical rotation 10x3   Decreased R medial wrist pain when pressing up.   - Standing Shoulder Internal Rotation with Anchored Resistance  - 1 x daily - 7 x weekly - 3 sets - 10 reps - 5 seconds hold   - Seated Shoulder Inferior Glide  - 1 x daily - 7 x weekly - 3 sets - 10 reps - 5 seconds hold - Isometric Tricep Extension   - 1 x daily - 7 x weekly - 3 sets - 10 reps - 5  hold  - Single Arm Doorway Pec Stretch at 60 Elevation  - 3 x daily - 7 x weekly - 1 sets - 5 reps - 30 seconds hold - Standing Shoulder External Rotation with Resistance  - 1 x daily - 3-4 x weekly - 2 sets - 10 reps - 5 seconds hold  Yellow band.  ASSESSMENT:  CLINICAL IMPRESSION: Continued working on improving scapular strength, inferior R shoulder joint mobility, anterior cervical muscle activation, and decreasing R cervical paraspinal and upper trap muscle tension to improve mechanics.  No pain reported after session. Pt will benefit from continued  skilled physical therapy services to decrease pain, improve strength, ROM, and function.     OBJECTIVE IMPAIRMENTS: decreased ROM, decreased strength, impaired UE functional use, improper body mechanics, postural dysfunction, and pain.   ACTIVITY LIMITATIONS: carrying, lifting, toileting, dressing, self feeding, reach over head, and hygiene/grooming  PARTICIPATION LIMITATIONS:   PERSONAL FACTORS: Fitness, Past/current experiences, Time since onset of injury/illness/exacerbation, and 3+ comorbidities: anxiety, arthritis, asthma, depression, DM, DJD are also affecting patient's functional outcome.   REHAB POTENTIAL: Fair    CLINICAL DECISION MAKING: Stable/uncomplicated  EVALUATION COMPLEXITY: Low   GOALS: Goals reviewed with patient? Yes  SHORT TERM GOALS: Target date: 07/05/2023  Pt will be independent with her initial HEP to decrease pain, improve ROM, strength, function, and ability to reach as well as don and doff clothes more comfortably.  Baseline:Pt has not yet started her initial HEP (06/24/2023)  Goal status: INITIAL  LONG TERM GOALS: Target date: 08/23/2023  Pt will have a decrease in R shoulder and arm pain to 5/10 or less at worst to promote ability to reach, don and doff clothes, lift, and carry more comfortably.  Baseline: 10/10 R shoulder and UE pain at worst (06/24/2023) Goal status: INITIAL  2.  Pt will improve her Upper Extremity Functional Index (UEFI) by at least 10 points as a demonstration of improved function.  Baseline: Upper Extremity Functional Index (UEFI): 32/80 (06/24/2023) Goal status: INITIAL  3.  Pt will improve her R shoulder flexion and abduction AROM to at least 110 degrees to promote ability to reach with less difficulty.  Baseline:  Active ROM Right eval  Shoulder flexion 70 with pain (95 degrees AAROM with pain, empty end feel)  Shoulder extension   Shoulder abduction 62 with pain (83 degrees AAROM with pain, empty end feel)    (06/24/2023)  Goal status: INITIAL  4.  Pt will improve her R shoulder functional IR to R thumb to T7 spinous process and functional R shoulder ER to 3rd digit to behind head to promote ability to don and doff clothes as well as perform self care/grooming more comfortably as well as with less difficulty  Baseline:  Active ROM Right eval  Shoulder internal rotation (functional) R thumb to R piriformis with R anterior shoulder and arm pain  Shoulder external rotation (functional) Unable to reach head, has R anterior shoulder pain.    (06/24/2023)  Goal status: INITIAL  5.  Pt will improve her R UE strength by at least 1/2 MMT to promote ability to use her R UE for functional tasks with less difficulty.  Baseline:  MMT Right eval  Shoulder flexion 3-  Shoulder extension   Shoulder abduction 3-  Shoulder adduction   Shoulder extension   Shoulder internal rotation 4- with anterior shoulder pain  Shoulder external rotation 4  Middle trapezius   Lower trapezius (seated manually resisted) 4-  Elbow flexion 4 with anterior shoulder and arm pain with R 4th and 5th digit paresthesia  Elbow extension (arm at side, 90 degrees elbow flexion) 4/5 with R anterior shoulder pain.    (06/24/2023)  Goal status: INITIAL     PLAN:  PT FREQUENCY: 1-2x/week  PT DURATION: 8 weeks  PLANNED INTERVENTIONS: 97110-Therapeutic exercises, 97530- Therapeutic activity, 97112- Neuromuscular re-education,  [id number]- Self Care, [phone]- Manual therapy, [id number]- Electrical stimulation (unattended), [phone]- Traction (mechanical), [id number]- Ionotophoresis 4mg /ml Dexamethasone , Patient/Family education, Dry Needling, Joint mobilization, Joint manipulation, Spinal manipulation, and Spinal mobilization  PLAN FOR NEXT SESSION: posture, scapular strengthening, R shoulder ROM and strengthening, manual techniques, modalities PRN   Adrian Dinovo, PT, DPT 07/30/2023, 12:17 PM

## 2023-08-01 ENCOUNTER — Ambulatory Visit: Payer: PRIVATE HEALTH INSURANCE

## 2023-08-01 DIAGNOSIS — M25511 Pain in right shoulder: Secondary | ICD-10-CM

## 2023-08-01 DIAGNOSIS — M5412 Radiculopathy, cervical region: Secondary | ICD-10-CM

## 2023-08-01 NOTE — Therapy (Signed)
 OUTPATIENT PHYSICAL THERAPY TREATMENT  Patient Name: Claire [name] MRN: [id number] DOB:Jun 26, 1970, 53 y.o., female Today's Date: 08/01/2023  END OF SESSION:  PT End of Session - 08/01/23 1348     Visit Number 8    Number of Visits 17    Date for PT Re-Evaluation 08/23/23    PT Start Time 1348    PT Stop Time 1428    PT Time Calculation (min) 40 min    Activity Tolerance Patient tolerated treatment well    Behavior During Therapy [facility] for tasks assessed/performed                 Past Medical History:  Diagnosis Date   Anemia    Anxiety    Arthritis    hands, hips   Asthma    Bilateral leg cramps    Depression    Diabetes mellitus without complication (HCC)    history no longer a problem since weight loss surgery   DJD (degenerative joint disease)    Dyspnea on exertion    a. 07/2019 Echo: EF 60-65%, no rwma, nl RV fxn, triv MR.   GERD (gastroesophageal reflux disease)    Headache    Migraines   IBS (irritable bowel syndrome)    Kidney stone    Low serum vitamin B12    Lung nodules    Migraine    down to approx 4x/mo since starting meds   Muscle spasm    Nonobstructive CAD (coronary artery disease)    a. 06/2014 ETT: Ex time 8:59, max HR 155, 10.1 METS, no ST/T changes; b. 08/2019 Cor CTA: Ca2+ 175 (99th %'ile), LAD 35-49p, LCX 0-84m-->Med rx.   Psoriasis    vaginal area   Seasonal allergies    Sleep apnea    history no longer a problem since weight loss surgery   Vasovagal syncope    [place], [name]    Vitamin D  deficiency    Past Surgical History:  Procedure Laterality Date   BLADDER SURGERY  2010   BREAST BIOPSY Right 2014   stereotatic biopsy   CHOLECYSTECTOMY  2010   COLONOSCOPY  2014    Done at [facility]   COLONOSCOPY WITH PROPOFOL  N/A 01/28/2018   Procedure: COLONOSCOPY WITH PROPOFOL ;  Surgeon: Irby Mannan, MD;  Location: [facility];  Service: Endoscopy;  Laterality: N/A;   DILITATION & CURRETTAGE/HYSTROSCOPY WITH  NOVASURE ABLATION N/A 06/16/2015   Procedure: DILATATION & CURETTAGE/HYSTEROSCOPY WITH NOVASURE ABLATION;  Surgeon: Meriam Stamp, MD;  Location: [facility];  Service: Gynecology;  Laterality: N/A;   ESOPHAGOGASTRODUODENOSCOPY (EGD) WITH PROPOFOL  N/A 01/28/2018   Procedure: ESOPHAGOGASTRODUODENOSCOPY (EGD) WITH BIOPSIES;  Surgeon: Irby Mannan, MD;  Location: [facility];  Service: Endoscopy;  Laterality: N/A;   GASTRIC ROUX-EN-Y N/A 11/03/2019   Procedure: LAPAROSCOPIC 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 WITH UPPER ENDOSCOPY;  Surgeon: Jacolyn Matar, MD;  Location: [facility];  Service: General;  Laterality: N/A;   HIATAL HERNIA REPAIR N/A 11/03/2019   Procedure: HERNIA REPAIR HIATAL;  Surgeon: Jacolyn Matar, MD;  Location: [facility];  Service: General;  Laterality: N/A;   LAPAROSCOPIC GASTRIC SLEEVE RESECTION  2012   LAPAROSCOPY N/A 06/16/2015   Procedure: LAPAROSCOPY DIAGNOSTIC;  Surgeon: Meriam Stamp, MD;  Location: [facility];  Service: Gynecology;  Laterality: N/A;   LAPAROSCOPY N/A 04/01/2018   Procedure: LAPAROSCOPY DIAGNOSTIC ERAS PATHWAY ENTEROLYSIS, CECOPEXY;  Surgeon: Jacolyn Matar, MD;  Location: [facility];  Service: General;  Laterality: N/A;   LITHOTRIPSY  12/11/2017   laser lithotripsy   LYSIS OF ADHESION N/A 06/16/2015   Procedure: LYSIS OF ADHESION;  Surgeon: Meriam Stamp, MD;  Location: [facility];  Service: Gynecology;  Laterality: N/A;   PLANTAR FASCIA SURGERY Bilateral    ROBOTIC ASSISTED SALPINGO OOPHERECTOMY Right 06/16/2015   Procedure: ROBOTIC ASSISTED SALPINGO OOPHORECTOMY, EXCISION OF RIGHT CUL DE SAC MASS;  Surgeon: Meriam Stamp, MD;  Location: [facility];  Service: Gynecology;  Laterality: Right;   TONSILLECTOMY     UPPER GI ENDOSCOPY     Patient Active Problem List   Diagnosis Date Noted   Spondylolisthesis of lumbar region 01/25/2023   Morbid obesity with BMI of 40.0-44.9, adult (HCC) 07/10/2021   ADD (attention deficit disorder)  07/10/2021   Type 2 diabetes mellitus with obesity (HCC) 07/10/2021   History of thyroiditis 07/10/2021   Fibromyalgia 07/10/2021   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/03/2019   B12 deficiency 04/10/2018   Columnar epithelial-lined lower esophagus    Postprocedural disorder of digestive system    Esophageal dysphagia    Persistent mood (affective) disorder, unspecified (HCC) 01/15/2018   Insomnia related to another mental disorder 01/15/2018   OCD (obsessive compulsive disorder) 10/29/2017   GAD (generalized anxiety disorder) 10/29/2017   Nephrolithiasis 10/22/2017   Atherosclerosis of aorta (HCC) 12/21/2016   Hiatal hernia 08/17/2016   Diverticulosis of colon 08/17/2016   Lumbosacral pain 01/07/2015   Intertrigo 11/05/2014   Perennial allergic rhinitis 09/03/2014   Lung nodules 09/03/2014   Vitamin D  deficiency 09/03/2014   History of kidney stones 09/03/2014   History of iron deficiency anemia 09/03/2014   Mild persistent asthma without complication 11/21/2012   GERD without esophagitis 11/21/2012   History of hyperlipidemia 11/21/2012   Migraine with aura and without status migrainosus 11/21/2012   History of sleep apnea 11/21/2012   IBS (irritable bowel syndrome) 08/15/2012    PCP: Arleen Lacer, MD   REFERRING PROVIDER: Saddie Crane, DO  REFERRING DIAG: M25.511 (ICD-10-CM) - Pain in right shoulder G89.29 (ICD-10-CM) - Other chronic pain M75.41 (ICD-10-CM) - Impingement syndrome of right shoulder  THERAPY DIAG:  Right shoulder pain, unspecified chronicity  Radiculopathy, cervical region  Rationale for Evaluation and Treatment: Rehabilitation  ONSET DATE: February 2025  SUBJECTIVE:  SUBJECTIVE STATEMENT: R shoulder is feeling ok, no pain currently.        Hand dominance: Right  PERTINENT HISTORY:  R shoulder pain. Was placed in a sling which made her pain worse. Has to use her R UE a lot. 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 Pt tried to lift up the window to open it for a client and heard a pop or a snap in her shoulder. Pain then radiated to her R arm to her elbow. Pt also states that she has fibromyalgia and gets referred pain. Got a steroid injection last Tuesday 06/18/2023 which helped her R shoulder but still has pain in her R arm. Was a 10/10 prior to the cortisone shot. Has 2 tears in her shoulder which have not retracted.   April 19, 2023 pt caught her client and kept her from falling as the client was trying to stand up from a chair, and pt slid her back to her bed (to her R, pt was on the client's L side). Has a TENS unit which kind of helped. Also tried ice and heat.   Was told that if PT does not work then pt has to see a Careers adviser.   No latex allergies No blood pressure problems per pt.     PAIN:  Are you having pain? Yes: NPRS scale: 4/10 (resting position) Pain location: R shoulder, arm, and elbow Pain description: strong ache Aggravating factors: reaching up, donning and doffing clothes, reaching across, reaching behind her back.  Relieving factors: propping her R arm onto a pillow in slight flexion/abduction.   PRECAUTIONS: No known precautions  RED FLAGS: Bowel or bladder incontinence: No and Cauda equina syndrome: No     WEIGHT BEARING RESTRICTIONS: No  FALLS:  Has patient fallen in last 6 months? 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   PLOF: Independent  PATIENT GOALS: Try to prevent surgery and decrease pain.   NEXT MD VISIT: none yet  OBJECTIVE:  Note: Objective measures were completed at Evaluation unless otherwise noted.  DIAGNOSTIC FINDINGS:  MR SHOULDER RIGHT WO CONTRAST 06/01/2023  Narrative & Impression  CLINICAL DATA:  Right shoulder pain for 2.5 months.  Lifting injury.   EXAM: MRI OF THE RIGHT SHOULDER WITHOUT CONTRAST   TECHNIQUE: Multiplanar, multisequence MR imaging of the shoulder was performed. No intravenous contrast was administered.   COMPARISON:  None Available.   FINDINGS: Rotator cuff: There is a partial-thickness bursal sided tear of a mid AP dimension of the supraspinatus tendon measuring up to 4 mm in transverse dimension (coronal images 15 and 16) and 8 mm in AP dimension (sagittal image 7). The posterior aspect of this tear is smaller and located within the midsubstance of the tendon (coronal image 15). No significant tendon retraction. Mild-to-moderate underlying intermediate T2 signal posterior supraspinatus and anterior infraspinatus tendinosis. The subscapularis and teres minor are intact.   Muscles: No rotator cuff muscle atrophy, fatty infiltration, or edema.   Biceps long head: The intra-articular long head of the biceps tendon is intact.   Acromioclavicular Joint: There are mild degenerative changes of the acromioclavicular joint including joint space narrowing, subchondral marrow edema, and peripheral  osteophytosis. Type I acromion. Trace fluid within the subacromial/subdeltoid bursa.   Glenohumeral Joint: Mild-to-moderate inferior anterior humeral head and mid glenoid cartilage thinning. No glenohumeral joint effusion.   Labrum: Grossly intact, but evaluation is limited by lack of intraarticular fluid.   Bones:  No acute fracture.   Other: None.   IMPRESSION: 1. Partial-thickness bursal  sided tear of the mid AP dimension of the supraspinatus tendon measuring up to 4 mm in transverse dimension and 8 mm in AP dimension. No significant tendon retraction. Mild-to-moderate underlying posterior supraspinatus and anterior infraspinatus tendinosis. 2. Mild degenerative changes of the acromioclavicular joint. 3. Mild-to-moderate inferior anterior humeral head and mid glenoid cartilage thinning.     Electronically Signed   By: Bertina Broccoli M.D.   On: 06/09/2023 12:19       PATIENT SURVEYS:  Upper Extremity Functional Index (UEFI): 32/80  COGNITION: Overall cognitive status: Within functional limits for tasks assessed  SENSATION:   POSTURE: Forward neck, movement preference around C5/C6 area, B protracted shoulders, L lateral shift with L trunk rotation.  PALPATION: TTP R first rib area, AC joint, acromion process anterior and lateral TTP R anterior and anterior medial arm TTP R lateral arm    Muscle tension R upper trap.    CERVICAL ROM:   Active ROM A/PROM (deg) eval  Flexion WFL with R upper trap and anterior lateral shoulder and arm symptoms (worse than extension)  Extension Limited with R upper trap and anterior lateral shoulder and arm symptoms.   Right lateral flexion WFL with L lateral neck tenderness  Left lateral flexion WFL with R upper trap and anterior lateral shoulder and arm symptoms.  As well as R anterior arm symptoms. Followed by R ulnar nerve/C8 dermatome symptoms to R medial elbow  Right rotation limited  Left rotation Limited and R anterior shoulder symptoms.    (Blank rows = not tested)  UPPER EXTREMITY ROM:  Active ROM Right eval Left eval  Shoulder flexion 70 with pain (95 degrees AAROM with pain, empty end feel)   Shoulder extension    Shoulder abduction 62 with pain (83 degrees AAROM with pain, empty end feel)   Shoulder adduction    Shoulder extension    Shoulder internal rotation (functional) R thumb to R piriformis with R  anterior shoulder and arm pain   Shoulder external rotation (functional) Unable to reach head, has R anterior shoulder pain.    Elbow flexion    Elbow extension    Wrist flexion    Wrist extension    Wrist ulnar deviation    Wrist radial deviation    Wrist pronation    Wrist supination     (Blank rows = not tested)  UPPER EXTREMITY MMT:  MMT Right eval Left eval  Shoulder flexion 3-   Shoulder extension    Shoulder abduction 3-   Shoulder adduction    Shoulder extension    Shoulder internal rotation 4- with anterior shoulder pain   Shoulder external rotation 4   Middle trapezius    Lower trapezius (seated manually resisted) 4- 4-  Elbow flexion 4 with anterior shoulder and arm pain with R 4th and 5th digit paresthesia   Elbow extension (arm at side, 90 degrees elbow flexion) 4/5 with R anterior shoulder pain.    Wrist flexion    Wrist extension    Wrist ulnar deviation    Wrist radial deviation    Wrist pronation    Wrist supination    Grip strength     (  Blank rows = not tested)  CERVICAL SPECIAL TESTS:  No change in pain with R shoulder flexion and scapular retraction  Chin tuck increased R UE symptoms.   (+) empty can, Hawkin's kennedy test R shoulder    FUNCTIONAL TESTS:    TREATMENT DATE: 08/01/2023                                                                                                                                Therapeutic exercise  Standing R shoulder flexion, abduction, functional IR, functional ER AROM  Reviewed POC: possible graduation of PT to HEP next week if appropriate.   Seated manually resisted scapular retraction targeting the lower trap muscle  R 10x3 with 5 second holds  Seated scapular depression isometrics, forearm on arm rest 10x3 with 5 second holds  Seated thoracic extension at chair 10x5 seconds for 3 sets     Improved exercise technique, movement at target joints, use of target muscles after mod verbal, visual,  tactile cues.   Manual therapy  Seated STM R cervical paraspinal and upper trap muscle to decrease tension.         PATIENT EDUCATION:  Education details: POC Person educated: Patient Education method: Explanation Education comprehension: verbalized understanding  HOME EXERCISE PROGRAM: Access Code: [email or web address] URL: [email or web address] Date: 07/01/2023 Prepared by: Suzzane Estes  Exercises - Supine Head Nod with Deep Neck Flexor Activation  - 3 x daily - 7 x weekly - 3 sets - 10 reps - 5 seconds hold - Supine Shoulder Flexion Extension Full Range AROM  - 1 x daily - 7 x weekly - 3 sets - 10 reps - Seated Scapular Retraction  - 3 x daily - 7 x weekly - 3 sets - 10 reps - 5 seconds hold   Seated R first rib stretch with strap   With cervical rotation 10x3   Decreased R medial wrist pain when pressing up.   - Standing Shoulder Internal Rotation with Anchored Resistance  - 1 x daily - 7 x weekly - 3 sets - 10 reps - 5 seconds hold   - Seated Shoulder Inferior Glide  - 1 x daily - 7 x weekly - 3 sets - 10 reps - 5 seconds hold - Isometric Tricep Extension   - 1 x daily - 7 x weekly - 3 sets - 10 reps - 5  hold  - Single Arm Doorway Pec Stretch at 60 Elevation  - 3 x daily - 7 x weekly - 1 sets - 5 reps - 30 seconds hold - Standing Shoulder External Rotation with Resistance  - 1 x daily - 3-4 x weekly - 2 sets - 10 reps - 5 seconds hold  Yellow band.    ASSESSMENT:  CLINICAL IMPRESSION: Pt demonstrates significant decrease in pain as well improved R shoulder AROM since initial evaluation. Pt making very good progress with PT towards goals. Continued working on improving scapular strength, thoracic  extension, and decreasing R cervical paraspinal and upper trap muscle tension to improve mechanics.  No pain reported after session. Pt will benefit from continued skilled physical therapy services to decrease pain, improve strength, ROM, and function.     OBJECTIVE  IMPAIRMENTS: decreased ROM, decreased strength, impaired UE functional use, improper body mechanics, postural dysfunction, and pain.   ACTIVITY LIMITATIONS: carrying, lifting, toileting, dressing, self feeding, reach over head, and hygiene/grooming  PARTICIPATION LIMITATIONS:   PERSONAL FACTORS: Fitness, Past/current experiences, Time since onset of injury/illness/exacerbation, and 3+ comorbidities: anxiety, arthritis, asthma, depression, DM, DJD are also affecting patient's functional outcome.   REHAB POTENTIAL: Fair    CLINICAL DECISION MAKING: Stable/uncomplicated  EVALUATION COMPLEXITY: Low   GOALS: Goals reviewed with patient? Yes  SHORT TERM GOALS: Target date: 07/05/2023  Pt will be independent with her initial HEP to decrease pain, improve ROM, strength, function, and ability to reach as well as don and doff clothes more comfortably.  Baseline:Pt has not yet started her initial HEP (06/24/2023); Able to perform her HEP, no questions. (08/01/2023) Goal status: MET  LONG TERM GOALS: Target date: 08/23/2023  Pt will have a decrease in R shoulder and arm pain to 5/10 or less at worst to promote ability to reach, don and doff clothes, lift, and carry more comfortably.  Baseline: 10/10 R shoulder and UE pain at worst (06/24/2023); 1/10 at most for the past 7 days (08/01/2023) Goal status: MET  2.  Pt will improve her Upper Extremity Functional Index (UEFI) by at least 10 points as a demonstration of improved function.  Baseline: Upper Extremity Functional Index (UEFI): 32/80 (06/24/2023) Goal status: INITIAL  3.  Pt will improve her R shoulder flexion and abduction AROM to at least 110 degrees to promote ability to reach with less difficulty.  Baseline:  Active ROM Right eval R (08/01/2023)  Shoulder flexion 70 with pain (95 degrees AAROM with pain, empty end feel) 153 degrees No pain  Shoulder extension    Shoulder abduction 62 with pain (83 degrees AAROM with pain, empty end feel)  166 degrees  No pain   (06/24/2023)  Goal status: MET  4.  Pt will improve her R shoulder functional IR to R thumb to T7 spinous process and functional R shoulder ER to 3rd digit to behind head to promote ability to don and doff clothes as well as perform self care/grooming more comfortably as well as with less difficulty  Baseline:  Active ROM Right eval Right (08/01/2023)  Shoulder internal rotation (functional) R thumb to R piriformis with R anterior shoulder and arm pain R thumb to T8 spinous process  Shoulder external rotation (functional) Unable to reach head, has R anterior shoulder pain.  R 3rd digit to L superior medial scapula.   (06/24/2023)  Goal status: PARTIALLY MET  5.  Pt will improve her R UE strength by at least 1/2 MMT to promote ability to use her R UE for functional tasks with less difficulty.  Baseline:  MMT Right eval  Shoulder flexion 3-  Shoulder extension   Shoulder abduction 3-  Shoulder adduction   Shoulder extension   Shoulder internal rotation 4- with anterior shoulder pain  Shoulder external rotation 4  Middle trapezius   Lower trapezius (seated manually resisted) 4-  Elbow flexion 4 with anterior shoulder and arm pain with R 4th and 5th digit paresthesia  Elbow extension (arm at side, 90 degrees elbow flexion) 4/5 with R anterior shoulder pain.    (06/24/2023)  Goal  status: INITIAL     PLAN:  PT FREQUENCY: 1-2x/week  PT DURATION: 8 weeks  PLANNED INTERVENTIONS: 97110-Therapeutic exercises, 97530- Therapeutic activity, 97112- Neuromuscular re-education, 97535- Self Care, [id number]- Manual therapy, [id number]- Electrical stimulation (unattended), [phone]- Traction (mechanical), [phone]- Ionotophoresis 4mg /ml Dexamethasone , Patient/Family education, Dry Needling, Joint mobilization, Joint manipulation, Spinal manipulation, and Spinal mobilization  PLAN FOR NEXT SESSION: posture, scapular strengthening, R shoulder ROM and strengthening, manual techniques,  modalities PRN   Ardene Remley, PT, DPT 08/01/2023, 2:29 PM

## 2023-08-05 ENCOUNTER — Ambulatory Visit: Payer: PRIVATE HEALTH INSURANCE

## 2023-08-05 DIAGNOSIS — M25511 Pain in right shoulder: Secondary | ICD-10-CM

## 2023-08-05 DIAGNOSIS — M5412 Radiculopathy, cervical region: Secondary | ICD-10-CM

## 2023-08-05 NOTE — Therapy (Signed)
 OUTPATIENT PHYSICAL THERAPY TREATMENT And Discharge Summary  Patient Name: Claire [name] MRN: [id number] DOB:06-20-1970, 53 y.o., female Today's Date: 08/05/2023  END OF SESSION:  PT End of Session - 08/05/23 1351     Visit Number 9    Number of Visits 17    Date for PT Re-Evaluation 08/23/23    PT Start Time 1351    PT Stop Time 1433    PT Time Calculation (min) 42 min    Activity Tolerance Patient tolerated treatment well    Behavior During Therapy [facility] for tasks assessed/performed                  Past Medical History:  Diagnosis Date   Anemia    Anxiety    Arthritis    hands, hips   Asthma    Bilateral leg cramps    Depression    Diabetes mellitus without complication (HCC)    history no longer a problem since weight loss surgery   DJD (degenerative joint disease)    Dyspnea on exertion    a. 07/2019 Echo: EF 60-65%, no rwma, nl RV fxn, triv MR.   GERD (gastroesophageal reflux disease)    Headache    Migraines   IBS (irritable bowel syndrome)    Kidney stone    Low serum vitamin B12    Lung nodules    Migraine    down to approx 4x/mo since starting meds   Muscle spasm    Nonobstructive CAD (coronary artery disease)    a. 06/2014 ETT: Ex time 8:59, max HR 155, 10.1 METS, no ST/T changes; b. 08/2019 Cor CTA: Ca2+ 175 (99th %'ile), LAD 35-49p, LCX 0-60m-->Med rx.   Psoriasis    vaginal area   Seasonal allergies    Sleep apnea    history no longer a problem since weight loss surgery   Vasovagal syncope    [place], [name]    Vitamin D  deficiency    Past Surgical History:  Procedure Laterality Date   BLADDER SURGERY  2010   BREAST BIOPSY Right 2014   stereotatic biopsy   CHOLECYSTECTOMY  2010   COLONOSCOPY  2014    Done at [facility]   COLONOSCOPY WITH PROPOFOL  N/A 01/28/2018   Procedure: COLONOSCOPY WITH PROPOFOL ;  Surgeon: Irby Mannan, MD;  Location: [facility];  Service: Endoscopy;  Laterality: N/A;   DILITATION &  CURRETTAGE/HYSTROSCOPY WITH NOVASURE ABLATION N/A 06/16/2015   Procedure: DILATATION & CURETTAGE/HYSTEROSCOPY WITH NOVASURE ABLATION;  Surgeon: Meriam Stamp, MD;  Location: [facility];  Service: Gynecology;  Laterality: N/A;   ESOPHAGOGASTRODUODENOSCOPY (EGD) WITH PROPOFOL  N/A 01/28/2018   Procedure: ESOPHAGOGASTRODUODENOSCOPY (EGD) WITH BIOPSIES;  Surgeon: Irby Mannan, MD;  Location: [facility];  Service: Endoscopy;  Laterality: N/A;   GASTRIC ROUX-EN-Y N/A 11/03/2019   Procedure: LAPAROSCOPIC 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 WITH UPPER ENDOSCOPY;  Surgeon: Jacolyn Matar, MD;  Location: [facility];  Service: General;  Laterality: N/A;   HIATAL HERNIA REPAIR N/A 11/03/2019   Procedure: HERNIA REPAIR HIATAL;  Surgeon: Jacolyn Matar, MD;  Location: [facility];  Service: General;  Laterality: N/A;   LAPAROSCOPIC GASTRIC SLEEVE RESECTION  2012   LAPAROSCOPY N/A 06/16/2015   Procedure: LAPAROSCOPY DIAGNOSTIC;  Surgeon: Meriam Stamp, MD;  Location: [facility];  Service: Gynecology;  Laterality: N/A;   LAPAROSCOPY N/A 04/01/2018   Procedure: LAPAROSCOPY DIAGNOSTIC ERAS PATHWAY ENTEROLYSIS, CECOPEXY;  Surgeon: Jacolyn Matar, MD;  Location: [facility];  Service: General;  Laterality: N/A;  LITHOTRIPSY  12/11/2017   laser lithotripsy   LYSIS OF ADHESION N/A 06/16/2015   Procedure: LYSIS OF ADHESION;  Surgeon: Meriam Stamp, MD;  Location: [facility];  Service: Gynecology;  Laterality: N/A;   PLANTAR FASCIA SURGERY Bilateral    ROBOTIC ASSISTED SALPINGO OOPHERECTOMY Right 06/16/2015   Procedure: ROBOTIC ASSISTED SALPINGO OOPHORECTOMY, EXCISION OF RIGHT CUL DE SAC MASS;  Surgeon: Meriam Stamp, MD;  Location: [facility];  Service: Gynecology;  Laterality: Right;   TONSILLECTOMY     UPPER GI ENDOSCOPY     Patient Active Problem List   Diagnosis Date Noted   Spondylolisthesis of lumbar region 01/25/2023   Morbid obesity with BMI of 40.0-44.9, adult (HCC) 07/10/2021   ADD  (attention deficit disorder) 07/10/2021   Type 2 diabetes mellitus with obesity (HCC) 07/10/2021   History of thyroiditis 07/10/2021   Fibromyalgia 07/10/2021   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/03/2019   B12 deficiency 04/10/2018   Columnar epithelial-lined lower esophagus    Postprocedural disorder of digestive system    Esophageal dysphagia    Persistent mood (affective) disorder, unspecified (HCC) 01/15/2018   Insomnia related to another mental disorder 01/15/2018   OCD (obsessive compulsive disorder) 10/29/2017   GAD (generalized anxiety disorder) 10/29/2017   Nephrolithiasis 10/22/2017   Atherosclerosis of aorta (HCC) 12/21/2016   Hiatal hernia 08/17/2016   Diverticulosis of colon 08/17/2016   Lumbosacral pain 01/07/2015   Intertrigo 11/05/2014   Perennial allergic rhinitis 09/03/2014   Lung nodules 09/03/2014   Vitamin D  deficiency 09/03/2014   History of kidney stones 09/03/2014   History of iron deficiency anemia 09/03/2014   Mild persistent asthma without complication 11/21/2012   GERD without esophagitis 11/21/2012   History of hyperlipidemia 11/21/2012   Migraine with aura and without status migrainosus 11/21/2012   History of sleep apnea 11/21/2012   IBS (irritable bowel syndrome) 08/15/2012    PCP: Arleen Lacer, MD   REFERRING PROVIDER: Saddie Crane, DO  REFERRING DIAG: M25.511 (ICD-10-CM) - Pain in right shoulder G89.29 (ICD-10-CM) - Other chronic pain M75.41 (ICD-10-CM) - Impingement syndrome of right shoulder  THERAPY DIAG:  Right shoulder pain, unspecified chronicity  Radiculopathy, cervical region  Rationale for Evaluation and Treatment: Rehabilitation  ONSET DATE: February 2025  SUBJECTIVE:  SUBJECTIVE STATEMENT: R shoulder is doing good. No pain currently. Feels like she can graduate PT and continue progress with her HEP after today's session.    Hand dominance: Right  PERTINENT HISTORY:  R shoulder pain. Was placed in a sling which made her pain worse. Has to use her R UE a lot. 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 Pt tried to lift up the window to open it for a client and heard a pop or a snap in her shoulder. Pain then radiated to her R arm to her elbow. Pt also states that she has fibromyalgia and gets referred pain. Got a steroid injection last Tuesday 06/18/2023 which helped her R shoulder but still has pain in her R arm. Was a 10/10 prior to the cortisone shot. Has 2 tears in her shoulder which have not retracted.   April 19, 2023 pt caught her client and kept her from falling as the client was trying to stand up from a chair, and pt slid her back to her bed (to her R, pt was on the client's L side). Has a TENS unit which kind of helped. Also tried ice and heat.   Was told that if PT does not work then pt has to see a Careers adviser.   No latex allergies No blood pressure problems per pt.     PAIN:  Are you having pain? Yes: NPRS scale: 4/10 (resting position) Pain location: R shoulder, arm, and elbow Pain description: strong ache Aggravating factors: reaching up, donning and doffing clothes, reaching across, reaching behind her back.  Relieving factors: propping her R arm onto a pillow in slight flexion/abduction.   PRECAUTIONS: No known precautions  RED FLAGS: Bowel or bladder incontinence: No and Cauda equina syndrome: No     WEIGHT BEARING RESTRICTIONS: No  FALLS:  Has patient fallen in last 6 months? 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   PLOF: Independent  PATIENT GOALS: Try to prevent surgery and decrease pain.   NEXT MD VISIT: none yet  OBJECTIVE:  Note: Objective measures were completed at Evaluation unless otherwise noted.  DIAGNOSTIC FINDINGS:  MR SHOULDER RIGHT WO CONTRAST 06/01/2023  Narrative & Impression  CLINICAL DATA:  Right shoulder pain for 2.5 months.  Lifting injury.   EXAM: MRI OF THE RIGHT SHOULDER WITHOUT CONTRAST   TECHNIQUE: Multiplanar, multisequence MR imaging of the shoulder was performed. No intravenous contrast was administered.   COMPARISON:  None Available.   FINDINGS: Rotator cuff: There is a partial-thickness bursal sided tear of a mid AP dimension of the supraspinatus tendon measuring up to 4 mm in transverse dimension (coronal images 15 and 16) and 8 mm in AP dimension (sagittal image 7). The posterior aspect of this tear is smaller and located within the midsubstance of the tendon (coronal image 15). No significant tendon retraction. Mild-to-moderate underlying intermediate T2 signal posterior supraspinatus and anterior infraspinatus tendinosis. The subscapularis and teres minor are intact.   Muscles: No rotator cuff muscle atrophy, fatty infiltration, or edema.   Biceps long head: The intra-articular long head of the biceps tendon is intact.   Acromioclavicular Joint: There are mild degenerative changes of the  acromioclavicular joint including joint space narrowing, subchondral marrow edema, and peripheral osteophytosis. Type I acromion. Trace fluid within the subacromial/subdeltoid bursa.   Glenohumeral Joint: Mild-to-moderate inferior anterior humeral head and mid glenoid cartilage thinning. No glenohumeral joint effusion.   Labrum: Grossly intact, but evaluation is limited by lack  of intraarticular fluid.   Bones:  No acute fracture.   Other: None.   IMPRESSION: 1. Partial-thickness bursal sided tear of the mid AP dimension of the supraspinatus tendon measuring up to 4 mm in transverse dimension and 8 mm in AP dimension. No significant tendon retraction. Mild-to-moderate underlying posterior supraspinatus and anterior infraspinatus tendinosis. 2. Mild degenerative changes of the acromioclavicular joint. 3. Mild-to-moderate inferior anterior humeral head and mid glenoid cartilage thinning.     Electronically Signed   By: Bertina Broccoli M.D.   On: 06/09/2023 12:19       PATIENT SURVEYS:  Upper Extremity Functional Index (UEFI): 32/80  COGNITION: Overall cognitive status: Within functional limits for tasks assessed  SENSATION:   POSTURE: Forward neck, movement preference around C5/C6 area, B protracted shoulders, L lateral shift with L trunk rotation.  PALPATION: TTP R first rib area, AC joint, acromion process anterior and lateral TTP R anterior and anterior medial arm TTP R lateral arm    Muscle tension R upper trap.    CERVICAL ROM:   Active ROM A/PROM (deg) eval  Flexion WFL with R upper trap and anterior lateral shoulder and arm symptoms (worse than extension)  Extension Limited with R upper trap and anterior lateral shoulder and arm symptoms.   Right lateral flexion WFL with L lateral neck tenderness  Left lateral flexion WFL with R upper trap and anterior lateral shoulder and arm symptoms.  As well as R anterior arm symptoms. Followed by R ulnar nerve/C8 dermatome symptoms to R medial elbow  Right rotation limited  Left rotation Limited and R anterior shoulder symptoms.    (Blank rows = not tested)  UPPER EXTREMITY ROM:  Active ROM Right eval Left eval  Shoulder flexion 70 with pain (95 degrees AAROM with pain, empty end feel)   Shoulder extension    Shoulder abduction 62 with pain (83 degrees AAROM with pain, empty end feel)    Shoulder adduction    Shoulder extension    Shoulder internal rotation (functional) R thumb to R piriformis with R anterior shoulder and arm pain   Shoulder external rotation (functional) Unable to reach head, has R anterior shoulder pain.    Elbow flexion    Elbow extension    Wrist flexion    Wrist extension    Wrist ulnar deviation    Wrist radial deviation    Wrist pronation    Wrist supination     (Blank rows = not tested)  UPPER EXTREMITY MMT:  MMT Right eval Left eval  Shoulder flexion 3-   Shoulder extension    Shoulder abduction 3-   Shoulder adduction    Shoulder extension    Shoulder internal rotation 4- with anterior shoulder pain   Shoulder external rotation 4   Middle trapezius    Lower trapezius (seated manually resisted) 4- 4-  Elbow flexion 4 with anterior shoulder and arm pain with R 4th and 5th digit paresthesia   Elbow extension (arm at side, 90 degrees elbow flexion) 4/5 with R anterior shoulder pain.    Wrist flexion    Wrist extension    Wrist ulnar deviation    Wrist radial deviation    Wrist  pronation    Wrist supination    Grip strength     (Blank rows = not tested)  CERVICAL SPECIAL TESTS:  No change in pain with R shoulder flexion and scapular retraction  Chin tuck increased R UE symptoms.   (+) empty can, Hawkin's kennedy test R shoulder    FUNCTIONAL TESTS:    TREATMENT DATE: 08/05/2023                                                                                                                                Therapeutic exercise  Manually resisted R shoulder flexion, abduction, ER, IR, elbow flexion, elbow extension, lower trap raise 1x each way  Seated manually resisted scapular retraction targeting the lower trap muscle  R 10x2 with 5 second holds  Seated scapular depression isometrics, forearm on arm rest to decrease B scalane muscle tension to neck and UE nerves R 10x3 with 5 second holds L 10x3 with 5 second  holds  Seated thoracic extension at chair 10x5 seconds for 3 sets     Improved exercise technique, movement at target joints, use of target muscles after mod verbal, visual, tactile cues.   Manual therapy  Seated STM R cervical paraspinal and upper trap muscle to decrease tension.         PATIENT EDUCATION:  Education details: POC Person educated: Patient Education method: Explanation Education comprehension: verbalized understanding  HOME EXERCISE PROGRAM: Access Code: [email or web address] URL: [email or web address] Date: 07/01/2023 Prepared by: Suzzane Estes  Exercises - Supine Head Nod with Deep Neck Flexor Activation  - 3 x daily - 7 x weekly - 3 sets - 10 reps - 5 seconds hold - Supine Shoulder Flexion Extension Full Range AROM  - 1 x daily - 7 x weekly - 3 sets - 10 reps - Seated Scapular Retraction  - 3 x daily - 7 x weekly - 3 sets - 10 reps - 5 seconds hold   Seated R first rib stretch with strap   With cervical rotation 10x3   Decreased R medial wrist pain when pressing up.   - Standing Shoulder Internal Rotation with Anchored Resistance  - 1 x daily - 7 x weekly - 3 sets - 10 reps - 5 seconds hold   - Seated Shoulder Inferior Glide  - 1 x daily - 7 x weekly - 3 sets - 10 reps - 5 seconds hold - Isometric Tricep Extension   - 1 x daily - 7 x weekly - 3 sets - 10 reps - 5  hold  - Single Arm Doorway Pec Stretch at 60 Elevation  - 3 x daily - 7 x weekly - 1 sets - 5 reps - 30 seconds hold - Standing Shoulder External Rotation with Resistance  - 1 x daily - 3-4 x weekly - 2 sets - 10 reps - 5 seconds hold  Yellow band.    ASSESSMENT:  CLINICAL IMPRESSION: Pt demonstrates significant decrease  in pain as well improved R shoulder AROM, strength, and function since initial evaluation. Pt has made very good progress with PT towards goals and demonstrates independence with her HEP. Skilled physical therapy services discharged with pt continuing her progress  with her exercises at home.      OBJECTIVE IMPAIRMENTS: decreased ROM, decreased strength, impaired UE functional use, improper body mechanics, postural dysfunction, and pain.   ACTIVITY LIMITATIONS: carrying, lifting, toileting, dressing, self feeding, reach over head, and hygiene/grooming  PARTICIPATION LIMITATIONS:   PERSONAL FACTORS: Fitness, Past/current experiences, Time since onset of injury/illness/exacerbation, and 3+ comorbidities: anxiety, arthritis, asthma, depression, DM, DJD are also affecting patient's functional outcome.   REHAB POTENTIAL: Fair    CLINICAL DECISION MAKING: Stable/uncomplicated  EVALUATION COMPLEXITY: Low   GOALS: Goals reviewed with patient? Yes  SHORT TERM GOALS: Target date: 07/05/2023  Pt will be independent with her initial HEP to decrease pain, improve ROM, strength, function, and ability to reach as well as don and doff clothes more comfortably.  Baseline:Pt has not yet started her initial HEP (06/24/2023); Able to perform her HEP, no questions. (08/01/2023) Goal status: MET  LONG TERM GOALS: Target date: 08/23/2023  Pt will have a decrease in R shoulder and arm pain to 5/10 or less at worst to promote ability to reach, don and doff clothes, lift, and carry more comfortably.  Baseline: 10/10 R shoulder and UE pain at worst (06/24/2023); 1/10 at most for the past 7 days (08/01/2023) Goal status: MET  2.  Pt will improve her Upper Extremity Functional Index (UEFI) by at least 10 points as a demonstration of improved function.  Baseline: Upper Extremity Functional Index (UEFI): 32/80 (06/24/2023); 80/80 (08/05/2023) Goal status: MET  3.  Pt will improve her R shoulder flexion and abduction AROM to at least 110 degrees to promote ability to reach with less difficulty.  Baseline:  Active ROM Right eval R (08/01/2023)  Shoulder flexion 70 with pain (95 degrees AAROM with pain, empty end feel) 153 degrees No pain  Shoulder extension    Shoulder  abduction 62 with pain (83 degrees AAROM with pain, empty end feel) 166 degrees  No pain   (06/24/2023)  Goal status: MET  4.  Pt will improve her R shoulder functional IR to R thumb to T7 spinous process and functional R shoulder ER to 3rd digit to behind head to promote ability to don and doff clothes as well as perform self care/grooming more comfortably as well as with less difficulty  Baseline:  Active ROM Right eval Right (08/01/2023)  Shoulder internal rotation (functional) R thumb to R piriformis with R anterior shoulder and arm pain R thumb to T8 spinous process  Shoulder external rotation (functional) Unable to reach head, has R anterior shoulder pain.  R 3rd digit to L superior medial scapula.   (06/24/2023)  Goal status: PARTIALLY MET  5.  Pt will improve her R UE strength by at least 1/2 MMT to promote ability to use her R UE for functional tasks with less difficulty.  Baseline:  MMT Right eval R  08/05/2023  Shoulder flexion 3- 4+  Shoulder extension    Shoulder abduction 3- 5  Shoulder adduction    Shoulder extension    Shoulder internal rotation 4- with anterior shoulder pain 4  Shoulder external rotation 4 4+  Middle trapezius    Lower trapezius (seated manually resisted) 4- 4  Elbow flexion 4 with anterior shoulder and arm pain with R 4th and 5th digit  paresthesia 4+, no pain  Elbow extension (arm at side, 90 degrees elbow flexion) 4/5 with R anterior shoulder pain.  5, no pain   (06/24/2023)  Goal status: MET     PLAN:  PT FREQUENCY: 1-2x/week  PT DURATION: 8 weeks  PLANNED INTERVENTIONS: 97110-Therapeutic exercises, 97530- Therapeutic activity, 97112- Neuromuscular re-education, 97535- Self Care, [id number]- Manual therapy, [id number]- Electrical stimulation (unattended), [phone]- Traction (mechanical), [phone]- Ionotophoresis 4mg /ml Dexamethasone , Patient/Family education, Dry Needling, Joint mobilization, Joint manipulation, Spinal manipulation, and Spinal  mobilization  PLAN FOR NEXT SESSION: posture, scapular strengthening, R shoulder ROM and strengthening, manual techniques, modalities PRN   Deloyd Handy, PT, DPT 08/05/2023, 2:41 PM

## 2023-08-07 ENCOUNTER — Ambulatory Visit: Payer: PRIVATE HEALTH INSURANCE

## 2023-08-08 NOTE — Progress Notes (Unsigned)
 PROVIDER NOTE: Interpretation of information contained herein should be left to medically-trained personnel. Specific patient instructions are provided elsewhere under Patient Instructions section of medical record. This document was created in part using AI and STT-dictation technology, any transcriptional errors that may result from this process are unintentional.  Patient: Claire [name]  Service: E/M   PCP: Sowles, Krichna, MD  DOB: 01-31-1971  DOS: 08/12/2023  Provider: Emmy MARLA Blanch, NP  MRN: [id number]  Delivery: Face-to-face  Specialty: Interventional Pain Management  Type: Established Patient  Setting: Ambulatory outpatient facility  Specialty designation: 09  Referring Prov.: Caffaro, Kaitlin T, PA-C  Location: Outpatient office facility       History of present illness (HPI) [name], a 54 y.o. year old female, is here today because of her No primary diagnosis found.. Claire [name] primary complain today is No chief complaint on file.  Pertinent problems: Claire [name] does not have any pertinent problems on file.  Pain Assessment: Severity of   is reported as a  /10. Location:    / . Onset:  . Quality:  . Timing:  . Modifying factor(s):  [name]:  vitals were not taken for this visit.  BMI: Estimated body mass index is 41.4 kg/m as calculated from the following:   Height as of 05/06/23: 5' 1 (1.549 m).   Weight as of 05/06/23: 219 lb 1.6 oz (99.4 kg).  Last encounter: Visit date not found. Last procedure: Visit date not found.  Reason for encounter:  *** .    Pain Assessment:  Location: Radiating: Onset: Duration: Quality of pain: Timing: Previous treatment:  Severity: Effect on ADL: Modifying factors:   Procedures: 04/17/2023: RFA to the bilateral L4-5 and L5-S1 facet joints (75% relief of pain in the left and 50% relief of pain in the right low back) 03/28/2023: MBB to the bilateral L4-5 and L5-S1 facet joints (10/10 to 1-2/10) 03/12/2023: MBB  to to the bilateral L4-5 and L5-S1 facet joints (10/10 to 1-2/10) 03/05/2023: Right subacromial injection 02/06/2023: Right L5-S1 and right S1 transforaminal ESI (no relief) 01/04/2023: Right trochanteric bursa injection (5 days of relief and then return of pain no relief of back pain)   Pharmacotherapy Assessment   Analgesic:{There is no content from the last Subjective section.}  Monitoring: [place] PMP: PDMP reviewed during this encounter.       Pharmacotherapy: No side-effects or adverse reactions reported. Compliance: No problems identified. Effectiveness: Clinically acceptable.  No notes on file  UDS:  No results found for: SUMMARY  No results found for: CBDTHCR No results found for: D8THCCBX No results found for: D9THCCBX  ROS  Constitutional: Denies any fever or chills Gastrointestinal: No reported hemesis, hematochezia, vomiting, or acute GI distress Musculoskeletal: Denies any acute onset joint swelling, redness, loss of ROM, or weakness Neurological: No reported episodes of acute onset apraxia, aphasia, dysarthria, agnosia, amnesia, paralysis, loss of coordination, or loss of consciousness  Medication Review  Calcium  Citrate-Vitamin D , Iron-Vitamin C, Lactobacillus, Multivitamin Adult, Semaglutide  (2 MG/DOSE), Vitamin D  (Ergocalciferol ), aspirin  EC, atorvastatin , budesonide -formoterol , clobetasol cream, cyanocobalamin , cyclobenzaprine , fluconazole , ketoconazole , mometasone , montelukast , nystatin , ondansetron , pantoprazole , pregabalin , traZODone , triamcinolone  cream, vortioxetine  HBr, and zonisamide  History Review  Allergy: Claire [name] is allergic to aspartame and phenylalanine, gabapentin, glucose, linzess  [linaclotide ], other, triamcinolone , cymbalta  [duloxetine  hcl], tape, and tapentadol. Drug: Claire [name]  reports no history of drug use. Alcohol:  reports current alcohol use. Tobacco:  reports that she quit smoking about 30 years ago. Her smoking use included  cigarettes.  She started smoking about 40 years ago. She has a 10 pack-year smoking history. She has never used smokeless tobacco. Social: Claire [name]  reports that she quit smoking about 30 years ago. Her smoking use included cigarettes. She started smoking about 40 years ago. She has a 10 pack-year smoking history. She has never used smokeless tobacco. She reports current alcohol use. She reports that she does not use drugs. Medical:  has a past medical history of Anemia, Anxiety, Arthritis, Asthma, Bilateral leg cramps, Depression, Diabetes mellitus without complication (HCC), DJD (degenerative joint disease), Dyspnea on exertion, GERD (gastroesophageal reflux disease), Headache, IBS (irritable bowel syndrome), Kidney stone, Low serum vitamin B12, Lung nodules, Migraine, Muscle spasm, Nonobstructive CAD (coronary artery disease), Psoriasis, Seasonal allergies, Sleep apnea, Vasovagal syncope, and Vitamin D  deficiency. Surgical: Claire [name]  has a past surgical history that includes Laparoscopic gastric sleeve resection (2012); Cholecystectomy (2010); Bladder surgery (2010); Plantar fascia surgery (Bilateral); Breast biopsy (Right, 2014); Colonoscopy (2014 ); Upper gi endoscopy; Tonsillectomy; laparoscopy (N/A, 06/16/2015); Robotic assisted salpingo oophorectomy (Right, 06/16/2015); Dilatation & currettage/hysteroscopy with novasure ablation (N/A, 06/16/2015); Lysis of adhesion (N/A, 06/16/2015); Colonoscopy with propofol  (N/A, 01/28/2018); Esophagogastroduodenoscopy (egd) with propofol  (N/A, 01/28/2018); Lithotripsy (12/11/2017); laparoscopy (N/A, 04/01/2018); Gastric Roux-En-Y (N/A, 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 Family: family history includes Breast cancer in her paternal aunt and paternal grandmother; Heart attack in her maternal uncle, mother, paternal grandfather, and sister; Hyperlipidemia in her father; Lung cancer in her father and maternal grandfather; Multiple myeloma in her  mother; Stroke in her mother.  Laboratory Chemistry Profile   Renal Lab Results  Component Value Date   BUN 14 01/11/2023   CREATININE 0.68 01/11/2023   LABCREA 153 01/11/2023   BCR SEE NOTE: 01/11/2023   GFRAA >60 11/04/2019   GFRNONAA >60 11/04/2019    Hepatic Lab Results  Component Value Date   AST 62 (H) 01/11/2023   ALT 77 (H) 01/11/2023   ALBUMIN 4.1 11/03/2019   ALKPHOS 67 11/03/2019   LIPASE 30 04/02/2019    Electrolytes Lab Results  Component Value Date   NA 135 01/11/2023   K 4.0 01/11/2023   CL 101 01/11/2023   CALCIUM  9.7 01/11/2023   MG 1.8 11/04/2019    Bone Lab Results  Component Value Date   VD25OH 31 01/11/2023    Inflammation (CRP: Acute Phase) (ESR: Chronic Phase) No results found for: CRP, ESRSEDRATE, LATICACIDVEN       Note: Above Lab results reviewed.  Recent Imaging Review  MR WRIST RIGHT WO CONTRAST EXAM DESCRIPTION: MR WRIST RIGHT WO CONTRAST  CLINICAL HISTORY: Pain.  COMPARISON: None Available.  TECHNIQUE: MRI of the wrist is performed according to our usual protocol with multiplanar multi sequence imaging.  FINDINGS: No fracture. No erosions. Mild first CMC joint osteoarthritis with joint space narrowing and chondrolysis. Small osteophytes. No significant degenerative edema. The marrow signal is unremarkable. The alignment is unremarkable.  No significant joint effusion. The ligaments are intact. The flexor tendons and carpal tunnel are unremarkable. The extensor tendons and musculature are unremarkable. No subcutaneous edema.  IMPRESSION: Mild first CMC joint osteoarthritis.  Electronically signed by: Reyes Frees MD 07/30/2023 10:23 PM EDT RP Workstation: [id number] Note: Reviewed        Narrative & Impression  CLINICAL DATA:  Right shoulder pain for 2.5 months.  Lifting injury.   EXAM: MRI OF THE RIGHT SHOULDER WITHOUT CONTRAST   TECHNIQUE: Multiplanar, multisequence MR imaging of the shoulder was  performed. No intravenous contrast was administered.  COMPARISON:  None Available.   FINDINGS: Rotator cuff: There is a partial-thickness bursal sided tear of a mid AP dimension of the supraspinatus tendon measuring up to 4 mm in transverse dimension (coronal images 15 and 16) and 8 mm in AP dimension (sagittal image 7). The posterior aspect of this tear is smaller and located within the midsubstance of the tendon (coronal image 15). No significant tendon retraction. Mild-to-moderate underlying intermediate T2 signal posterior supraspinatus and anterior infraspinatus tendinosis. The subscapularis and teres minor are intact.   Muscles: No rotator cuff muscle atrophy, fatty infiltration, or edema.   Biceps long head: The intra-articular long head of the biceps tendon is intact.   Acromioclavicular Joint: There are mild degenerative changes of the acromioclavicular joint including joint space narrowing, subchondral marrow edema, and peripheral osteophytosis. Type I acromion. Trace fluid within the subacromial/subdeltoid bursa.   Glenohumeral Joint: Mild-to-moderate inferior anterior humeral head and mid glenoid cartilage thinning. No glenohumeral joint effusion.   Labrum: Grossly intact, but evaluation is limited by lack of intraarticular fluid.   Bones:  No acute fracture.   Other: None.   IMPRESSION: 1. Partial-thickness bursal sided tear of the mid AP dimension of the supraspinatus tendon measuring up to 4 mm in transverse dimension and 8 mm in AP dimension. No significant tendon retraction. Mild-to-moderate underlying posterior supraspinatus and anterior infraspinatus tendinosis. 2. Mild degenerative changes of the acromioclavicular joint. 3. Mild-to-moderate inferior anterior humeral head and mid glenoid cartilage thinning.     Electronically Signed   By: Tanda Lyons M.D.   On: 06/09/2023 12:19   Narrative & Impression  CLINICAL DATA:  Lower back pain with  symptoms persisting over 6 weeks of treatment   EXAM: MRI LUMBAR SPINE WITHOUT CONTRAST   TECHNIQUE: Multiplanar, multisequence MR imaging of the lumbar spine was performed. No intravenous contrast was administered.   COMPARISON:  12/28/2015   FINDINGS: Segmentation:  Standard.   Alignment:  Mild degenerative anterolisthesis at L4-5 since prior.   Vertebrae: No fracture, evidence of discitis, or bone lesion. Low-grade edematous signal at L4-5 facets bilaterally.   Conus medullaris and cauda equina: Conus extends to the T12-L1 level. Conus and cauda equina appear normal.   Paraspinal and other soft tissues: No perispinal mass or inflammation.   Disc levels:   T12- L1: Unremarkable.   L1-L2: Mild facet spurring.  No neural impingement   L2-L3: Mild facet spurring.  No neural impingement   L3-L4: Moderate degenerative facet spurring asymmetric to the right. Negative disc. No neural impingement   L4-L5: Advanced facet degeneration with spurring and joint distortion. Mild anterolisthesis since prior. Minor disc bulging especially at the foramina but patent canal and foramen bilaterally.   L5-S1:Degenerative facet spurring asymmetric to the left. No herniation or impingement   IMPRESSION: 1. Lumbar spine degeneration especially affecting the facets, greatest at L4-5 where there is anterolisthesis and low-grade marrow edema bilaterally. 2. No neural compression.     Electronically Signed   By: Dorn Roulette M.D.   On: 01/23/2023 08:09   Physical Exam  General appearance: Well nourished, well developed, and well hydrated. In no apparent acute distress Mental status: Alert, oriented x 3 (person, place, & time)       Respiratory: No evidence of acute respiratory distress Eyes: PERLA Vitals: There were no vitals taken for this visit. BMI: Estimated body mass index is 41.4 kg/m as calculated from the following:   Height as of 05/06/23: 5' 1 (1.549 m).   Weight as  of 05/06/23: 219 lb 1.6 oz (99.4 kg). Ideal: Patient weight not recorded  Assessment   Diagnosis Status  No diagnosis found. Controlled Controlled Controlled   Updated Problems: No problems updated.  Plan of Care  Problem-specific:  Assessment and Plan            Ms. Francessca E [name] has a current medication list which includes the following long-term medication(s): atorvastatin , budesonide -formoterol , calcium  citrate chewy bite, mometasone , montelukast , pregabalin , trazodone , and zonisamide.  Pharmacotherapy (Medications Ordered): No orders of the defined types were placed in this encounter.  Orders:  No orders of the defined types were placed in this encounter.    {There is no content from the last Plan section.}   No follow-ups on file.    Recent Visits No visits were found meeting these conditions. Showing recent visits within past 90 days and meeting all other requirements Future Appointments Date Type Provider Dept  08/12/23 Appointment Jyra Lagares K, NP [facility]  Showing future appointments within next 90 days and meeting all other requirements  I discussed the assessment and treatment plan with the patient. The patient was provided an opportunity to ask questions and all were answered. The patient agreed with the plan and demonstrated an understanding of the instructions.  Patient advised to call back or seek an in-person evaluation if the symptoms or condition worsens.  Duration of encounter: *** minutes.  Total time on encounter, as per AMA guidelines included both the face-to-face and non-face-to-face time personally spent by the physician and/or other qualified health care professional(s) on the day of the encounter (includes time in activities that require the physician or other qualified health care professional and does not include time in activities normally performed by clinical staff). Physician's time may include the following  activities when performed: Preparing to see the patient (e.g., pre-charting review of records, searching for previously ordered imaging, lab work, and nerve conduction tests) Review of prior analgesic pharmacotherapies. Reviewing PMP Interpreting ordered tests (e.g., lab work, imaging, nerve conduction tests) Performing post-procedure evaluations, including interpretation of diagnostic procedures Obtaining and/or reviewing separately obtained history Performing a medically appropriate examination and/or evaluation Counseling and educating the patient/family/caregiver Ordering medications, tests, or procedures Referring and communicating with other health care professionals (when not separately reported) Documenting clinical information in the electronic or other health record Independently interpreting results (not separately reported) and communicating results to the patient/ family/caregiver Care coordination (not separately reported)  Note by: Nasrin Lanzo K Austynn Pridmore, NP (TTS and AI technology used. I apologize for any typographical errors that were not detected and corrected.) Date: 08/12/2023; Time: 2:25 PM

## 2023-08-12 ENCOUNTER — Ambulatory Visit: Payer: PRIVATE HEALTH INSURANCE

## 2023-08-12 ENCOUNTER — Ambulatory Visit: Payer: PRIVATE HEALTH INSURANCE | Attending: Nurse Practitioner | Admitting: Nurse Practitioner

## 2023-08-12 ENCOUNTER — Encounter: Payer: Self-pay | Admitting: Nurse Practitioner

## 2023-08-12 ENCOUNTER — Other Ambulatory Visit (HOSPITAL_COMMUNITY): Payer: Self-pay

## 2023-08-12 ENCOUNTER — Telehealth: Payer: Self-pay | Admitting: Pharmacy Technician

## 2023-08-12 VITALS — BP 133/67 | HR 69 | Temp 97.5°F | Resp 16 | Ht 61.0 in | Wt 223.1 lb

## 2023-08-12 DIAGNOSIS — M4316 Spondylolisthesis, lumbar region: Secondary | ICD-10-CM | POA: Insufficient documentation

## 2023-08-12 DIAGNOSIS — G8929 Other chronic pain: Secondary | ICD-10-CM | POA: Insufficient documentation

## 2023-08-12 DIAGNOSIS — M797 Fibromyalgia: Secondary | ICD-10-CM | POA: Insufficient documentation

## 2023-08-12 DIAGNOSIS — M25511 Pain in right shoulder: Secondary | ICD-10-CM | POA: Diagnosis present

## 2023-08-12 DIAGNOSIS — M545 Low back pain, unspecified: Secondary | ICD-10-CM | POA: Insufficient documentation

## 2023-08-12 DIAGNOSIS — Z6841 Body Mass Index (BMI) 40.0 and over, adult: Secondary | ICD-10-CM | POA: Diagnosis present

## 2023-08-12 DIAGNOSIS — J4530 Mild persistent asthma, uncomplicated: Secondary | ICD-10-CM | POA: Diagnosis present

## 2023-08-12 DIAGNOSIS — G894 Chronic pain syndrome: Secondary | ICD-10-CM | POA: Diagnosis not present

## 2023-08-12 DIAGNOSIS — I700 Atherosclerosis of aorta: Secondary | ICD-10-CM | POA: Insufficient documentation

## 2023-08-12 DIAGNOSIS — M7918 Myalgia, other site: Secondary | ICD-10-CM | POA: Diagnosis present

## 2023-08-12 NOTE — Telephone Encounter (Signed)
 Pharmacy Patient Advocate Encounter  Received notification from [employer] that Prior Authorization for Ozempic  (2 MG/DOSE) 8MG /3ML pen-injectors has been APPROVED from 08/12/23 to 08/11/24. Ran test claim, Copay is $0.00 for 3 months. This test claim was processed through [facility]- copay amounts may vary at other pharmacies due to pharmacy/plan contracts, or as the patient moves through the different stages of their insurance plan.   PA #/Case ID/Reference #: [id number]

## 2023-08-12 NOTE — Progress Notes (Signed)
 Safety precautions to be maintained throughout the outpatient stay will include: orient to surroundings, keep bed in low position, maintain call bell within reach at all times, provide assistance with transfer out of bed and ambulation.   Pt has been given [employer] and P[employer] consultant.and Art gallery manager.

## 2023-08-12 NOTE — Patient Instructions (Signed)
GENERAL RISKS AND COMPLICATIONS ° °What are the risk, side effects and possible complications? °Generally speaking, most procedures are safe.  However, with any procedure there are risks, side effects, and the possibility of complications.  The risks and complications are dependent upon the sites that are lesioned, or the type of nerve block to be performed.  The closer the procedure is to the spine, the more serious the risks are.  Great care is taken when placing the radio frequency needles, block needles or lesioning probes, but sometimes complications can occur. °Infection: Any time there is an injection through the skin, there is a risk of infection.  This is why sterile conditions are used for these blocks.  There are four possible types of infection. °Localized skin infection. °Central Nervous System Infection-This can be in the form of Meningitis, which can be deadly. °Epidural Infections-This can be in the form of an epidural abscess, which can cause pressure inside of the spine, causing compression of the spinal cord with subsequent paralysis. This would require an emergency surgery to decompress, and there are no guarantees that the patient would recover from the paralysis. °Discitis-This is an infection of the intervertebral discs.  It occurs in about 1% of discography procedures.  It is difficult to treat and it may lead to surgery. ° °      2. Pain: the needles have to go through skin and soft tissues, will cause soreness. °      3. Damage to internal structures:  The nerves to be lesioned may be near blood vessels or   ° other nerves which can be potentially damaged. °      4. Bleeding: Bleeding is more common if the patient is taking blood thinners such as  aspirin, Coumadin, Ticiid, Plavix, etc., or if he/she have some genetic predisposition  such as hemophilia. Bleeding into the spinal canal can cause compression of the spinal  cord with subsequent paralysis.  This would require an emergency  surgery to  decompress and there are no guarantees that the patient would recover from the  paralysis. °      5. Pneumothorax:  Puncturing of a lung is a possibility, every time a needle is introduced in  the area of the chest or upper back.  Pneumothorax refers to free air around the  collapsed lung(s), inside of the thoracic cavity (chest cavity).  Another two possible  complications related to a similar event would include: Hemothorax and Chylothorax.   These are variations of the Pneumothorax, where instead of air around the collapsed  lung(s), you may have blood or chyle, respectively. °      6. Spinal headaches: They may occur with any procedures in the area of the spine. °      7. Persistent CSF (Cerebro-Spinal Fluid) leakage: This is a rare problem, but may occur  with prolonged intrathecal or epidural catheters either due to the formation of a fistulous  track or a dural tear. °      8. Nerve damage: By working so close to the spinal cord, there is always a possibility of  nerve damage, which could be as serious as a permanent spinal cord injury with  paralysis. °      9. Death:  Although rare, severe deadly allergic reactions known as "Anaphylactic  reaction" can occur to any of the medications used. °     10. Worsening of the symptoms:  We can always make thing worse. ° °What are the chances   of something like this happening? °Chances of any of this occuring are extremely low.  By statistics, you have more of a chance of getting killed in a motor vehicle accident: while driving to the hospital than any of the above occurring .  Nevertheless, you should be aware that they are possibilities.  In general, it is similar to taking a shower.  Everybody knows that you can slip, hit your head and get killed.  Does that mean that you should not shower again?  Nevertheless always keep in mind that statistics do not mean anything if you happen to be on the wrong side of them.  Even if a procedure has a 1 (one) in a  1,000,000 (million) chance of going wrong, it you happen to be that one..Also, keep in mind that by statistics, you have more of a chance of having something go wrong when taking medications. ° °Who should not have this procedure? °If you are on a blood thinning medication (e.g. Coumadin, Plavix, see list of "Blood Thinners"), or if you have an active infection going on, you should not have the procedure.  If you are taking any blood thinners, please inform your physician. ° °How should I prepare for this procedure? °Do not eat or drink anything at least six hours prior to the procedure. °Bring a driver with you .  It cannot be a taxi. °Come accompanied by an adult that can drive you back, and that is strong enough to help you if your legs get weak or numb from the local anesthetic. °Take all of your medicines the morning of the procedure with just enough water to swallow them. °If you have diabetes, make sure that you are scheduled to have your procedure done first thing in the morning, whenever possible. °If you have diabetes, take only half of your insulin dose and notify our nurse that you have done so as soon as you arrive at the clinic. °If you are diabetic, but only take blood sugar pills (oral hypoglycemic), then do not take them on the morning of your procedure.  You may take them after you have had the procedure. °Do not take aspirin or any aspirin-containing medications, at least eleven (11) days prior to the procedure.  They may prolong bleeding. °Wear loose fitting clothing that may be easy to take off and that you would not mind if it got stained with Betadine or blood. °Do not wear any jewelry or perfume °Remove any nail coloring.  It will interfere with some of our monitoring equipment. ° °NOTE: Remember that this is not meant to be interpreted as a complete list of all possible complications.  Unforeseen problems may occur. ° °BLOOD THINNERS °The following drugs contain aspirin or other products,  which can cause increased bleeding during surgery and should not be taken for 2 weeks prior to and 1 week after surgery.  If you should need take something for relief of minor pain, you may take acetaminophen which is found in Tylenol,m Datril, Anacin-3 and Panadol. It is not blood thinner. The products listed below are.  Do not take any of the products listed below in addition to any listed on your instruction sheet. ° °A.P.C or A.P.C with Codeine Codeine Phosphate Capsules #3 Ibuprofen Ridaura  °ABC compound Congesprin Imuran rimadil  °Advil Cope Indocin Robaxisal  °Alka-Seltzer Effervescent Pain Reliever and Antacid Coricidin or Coricidin-D ° Indomethacin Rufen  °Alka-Seltzer plus Cold Medicine Cosprin Ketoprofen S-A-C Tablets  °Anacin Analgesic Tablets or Capsules Coumadin   Korlgesic Salflex  Anacin Extra Strength Analgesic tablets or capsules CP-2 Tablets Lanoril Salicylate  Anaprox Cuprimine Capsules Levenox Salocol  Anexsia-D Dalteparin Magan Salsalate  Anodynos Darvon compound Magnesium Salicylate Sine-off  Ansaid Dasin Capsules Magsal Sodium Salicylate  Anturane Depen Capsules Marnal Soma  APF Arthritis pain formula Dewitt's Pills Measurin Stanback  Argesic Dia-Gesic Meclofenamic Sulfinpyrazone  Arthritis Bayer Timed Release Aspirin Diclofenac Meclomen Sulindac  Arthritis pain formula Anacin Dicumarol Medipren Supac  Analgesic (Safety coated) Arthralgen Diffunasal Mefanamic Suprofen  Arthritis Strength Bufferin Dihydrocodeine Mepro Compound Suprol  Arthropan liquid Dopirydamole Methcarbomol with Aspirin Synalgos  ASA tablets/Enseals Disalcid Micrainin Tagament  Ascriptin Doan's Midol Talwin  Ascriptin A/D Dolene Mobidin Tanderil  Ascriptin Extra Strength Dolobid Moblgesic Ticlid  Ascriptin with Codeine Doloprin or Doloprin with Codeine Momentum Tolectin  Asperbuf Duoprin Mono-gesic Trendar  Aspergum Duradyne Motrin or Motrin IB Triminicin  Aspirin plain, buffered or enteric coated  Durasal Myochrisine Trigesic  Aspirin Suppositories Easprin Nalfon Trillsate  Aspirin with Codeine Ecotrin Regular or Extra Strength Naprosyn Uracel  Atromid-S Efficin Naproxen Ursinus  Auranofin Capsules Elmiron Neocylate Vanquish  Axotal Emagrin Norgesic Verin  Azathioprine Empirin or Empirin with Codeine Normiflo Vitamin E  Azolid Emprazil Nuprin Voltaren  Bayer Aspirin plain, buffered or children's or timed BC Tablets or powders Encaprin Orgaran Warfarin Sodium  Buff-a-Comp Enoxaparin Orudis Zorpin  Buff-a-Comp with Codeine Equegesic Os-Cal-Gesic   Buffaprin Excedrin plain, buffered or Extra Strength Oxalid   Bufferin Arthritis Strength Feldene Oxphenbutazone   Bufferin plain or Extra Strength Feldene Capsules Oxycodone with Aspirin   Bufferin with Codeine Fenoprofen Fenoprofen Pabalate or Pabalate-SF   Buffets II Flogesic Panagesic   Buffinol plain or Extra Strength Florinal or Florinal with Codeine Panwarfarin   Buf-Tabs Flurbiprofen Penicillamine   Butalbital Compound Four-way cold tablets Penicillin   Butazolidin Fragmin Pepto-Bismol   Carbenicillin Geminisyn Percodan   Carna Arthritis Reliever Geopen Persantine   Carprofen Gold's salt Persistin   Chloramphenicol Goody's Phenylbutazone   Chloromycetin Haltrain Piroxlcam   Clmetidine heparin Plaquenil   Cllnoril Hyco-pap Ponstel   Clofibrate Hydroxy chloroquine Propoxyphen         Before stopping any of these medications, be sure to consult the physician who ordered them.  Some, such as Coumadin (Warfarin) are ordered to prevent or treat serious conditions such as "deep thrombosis", "pumonary embolisms", and other heart problems.  The amount of time that you may need off of the medication may also vary with the medication and the reason for which you were taking it.  If you are taking any of these medications, please make sure you notify your pain physician before you undergo any procedures.         Trigger Point  Injections Patient Information  Description: Trigger points are areas of muscle sensitive to touch which cause pain with movement, sometimes felt some distance from the site of palpation.  Usually the muscle containing these trigger points if felt as a tight band or knot.   The area of maximum tenderness or trigger point is identified, and after antiseptic preparation of the skin, a small needle is placed into this site.  Reproduction of the pain often occurs and numbing medicine (local anesthetic) is injected into the site, sometimes along with steroid preparation.  The entire block usually lasts less than 5 minutes.  Conditions which may be treated by trigger points:  Muscular pain and spasm Nerve irritation  Preparation for the injection:  Do not eat any solid food or dairy products within 8 hours  of your appointment. You may drink clear liquids up to 3 hours before appointment.  Clear liquids include water, black coffee, juice or soda.  No milk or cream please. You may take your regular medications, including pain medications, with a sip of water before your appointment.  Diabetics should hold regular insulin ( if take separately) and take 1/2 normal NPH dose the morning of the procedure.  Carry some sugar containing items with you to your appointment. A driver must accompany you and be prepared to drive you home after your procedure.  Bring all your current medications with you. An IV may be inserted and sedation may be given at the discretion of the physician.  A blood pressure cuff, EKG, and other monitors will often be applied during the procedure.  Some patients may need to have extra oxygen administered for a short period. You will be asked to provide medical information, including your allergies and medications, prior to the procedure.  We must know immediately if you are taking blood thinners (like Coumadin/Warfarin) or if you are allergic to IV iodine contrast (dye).  We must know if  you could possibly be pregnant.  Possible side-effects:  Bleeding from needle site Infection (rare, may require surgery) Nerve injury (rare) Numbness & tingling (temporary) Punctured lung (if injection around chest) Light-headedness (temporary) Pain at injection site (several days) Decreased blood pressure (rare, temporary) Weakness in arm/leg (temporary)  Call if you experience:  Hive or difficulty breathing (go to the emergency room) Inflammation or drainage at the injection site(s)  Please note:  Although the local anesthetic injected can often make your painful muscle feel good for several hours after the injection, the pain may return.  It takes 3-7 days for steroids to work.  You may not notice any pain relief for at least one week.  If effective, we will often do a series of injections spaced 3-6 weeks apart to maximally decrease your pain.  If you have any questions please call [phone] [facility]

## 2023-08-12 NOTE — Telephone Encounter (Signed)
 Pharmacy Patient Advocate Encounter   Received notification from CoverMyMeds that prior authorization for Ozempic  (2 MG/DOSE) 8MG /3ML pen-injectors is required/requested.   Insurance verification completed.   The patient is insured through [employer] .   Per test claim: PA required; PA submitted to above mentioned insurance via CoverMyMeds Key/confirmation #/EOC [id number] Status is pending

## 2023-08-14 ENCOUNTER — Other Ambulatory Visit (HOSPITAL_COMMUNITY): Payer: Self-pay

## 2023-08-14 ENCOUNTER — Ambulatory Visit: Payer: PRIVATE HEALTH INSURANCE

## 2023-08-19 ENCOUNTER — Ambulatory Visit
Payer: PRIVATE HEALTH INSURANCE | Attending: Student in an Organized Health Care Education/Training Program | Admitting: Student in an Organized Health Care Education/Training Program

## 2023-08-19 ENCOUNTER — Encounter: Payer: Self-pay | Admitting: Student in an Organized Health Care Education/Training Program

## 2023-08-19 VITALS — BP 148/71 | HR 65 | Temp 97.5°F | Resp 18 | Ht 61.0 in | Wt 223.0 lb

## 2023-08-19 DIAGNOSIS — M7918 Myalgia, other site: Secondary | ICD-10-CM | POA: Insufficient documentation

## 2023-08-19 MED ORDER — ROPIVACAINE HCL 2 MG/ML IJ SOLN
9.0000 mL | Freq: Once | INTRAMUSCULAR | Status: AC
  Administered 2023-08-19: 18 mL via PERINEURAL

## 2023-08-19 NOTE — Progress Notes (Signed)
 PROVIDER NOTE: Interpretation of information contained herein should be left to medically-trained personnel. Specific patient instructions are provided elsewhere under Patient Instructions section of medical record. This document was created in part using STT-dictation technology, any transcriptional errors that may result from this process are unintentional.  Patient: Claire [name] Type: Established DOB: 1971/01/27 MRN: [id number] PCP: Sowles, Krichna, MD  Service: Procedure DOS: 08/19/2023 Setting: Ambulatory Location: Ambulatory outpatient facility Delivery: Face-to-face Provider: Wallie Sherry, MD Specialty: Interventional Pain Management Specialty designation: 09 Location: Outpatient facility Ref. Prov.: Sowles, Krichna, MD       Interventional Therapy   Type:  Cervical and Trapezius Trigger Point Injection (Myoneural Block)  CPT: [id number] Laterality: Bilateral (-50)   Imaging: N/A. Landmark-guided,           Anesthesia: Local anesthesia (1-2% Lidocaine ) DOS: 08/19/2023  Performed by: Wallie Sherry, MD  Medical Necessity (reasoning)  Purpose: Diagnostic/Therapeutic Rationale (medical necessity): procedure needed and proper for the diagnosis and/or treatment of Claire [name]'s medical symptoms and needs. 1. Myofascial pain syndrome    NAS-11 Pain score:   Pre-procedure: 4 /10   Post-procedure: 4 /10        Approach: Percutaneous  Type of procedure: Myoneural injection   Position  Prep  Materials  Position: Sitting. Patient assisted into a comfortable position. Pressure points checked.  Prep solution: ChloraPrep (2% chlorhexidine  gluconate and 70% isopropyl alcohol) The target area was identified and the area prepped in the usual manner.  Prep Area: cervical and thoracic area Materials:   Tray: Block Needle(s):  Type: Regular  Gauge (G): 25  Length: 1.5-in  Qty: 1  H&P (Pre-op Assessment):  Claire [name] is a 53 y.o. (year old), female patient, seen today  for interventional treatment. She  has a past surgical history that includes Laparoscopic gastric sleeve resection (2012); Cholecystectomy (2010); Bladder surgery (2010); Plantar fascia surgery (Bilateral); Breast biopsy (Right, 2014); Colonoscopy (2014 ); Upper gi endoscopy; Tonsillectomy; laparoscopy (N/A, 06/16/2015); Robotic assisted salpingo oophorectomy (Right, 06/16/2015); Dilatation & currettage/hysteroscopy with novasure ablation (N/A, 06/16/2015); Lysis of adhesion (N/A, 06/16/2015); Colonoscopy with propofol  (N/A, 01/28/2018); Esophagogastroduodenoscopy (egd) with propofol  (N/A, 01/28/2018); Lithotripsy (12/11/2017); laparoscopy (N/A, 04/01/2018); Gastric Roux-En-Y (N/A, 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 Claire [name] has a current medication list which includes the following prescription(s): aspirin  ec, atorvastatin , budesonide -formoterol , calcium  citrate chewy bite, clobetasol cream, cyanocobalamin , cyclobenzaprine , fluconazole , iron-vitamin c, ketoconazole , ketoconazole , lactobacillus, mometasone , mometasone , montelukast , multivitamin adult, nystatin , ondansetron , pantoprazole , pregabalin , ozempic  (2 mg/dose), trazodone , triamcinolone  cream, trintellix , vitamin d  (ergocalciferol ), and zonisamide. Her primarily concern today is the Arm Pain (Right upper and lower)  Initial Vital Signs:  Pulse/HCG Rate: 65  Temp: (!) 97.5 F (36.4 C) Resp: 18 BP: (!) 148/71 SpO2: 98 %  BMI: Estimated body mass index is 42.14 kg/m as calculated from the following:   Height as of this encounter: 5' 1 (1.549 m).   Weight as of this encounter: 223 lb (101.2 kg).  Risk Assessment: Allergies: Reviewed. She is allergic to aspartame and phenylalanine, gabapentin, glucose, linzess  [linaclotide ], other, triamcinolone , cymbalta  [duloxetine  hcl], tape, and tapentadol.  Allergy Precautions: None required Coagulopathies: Reviewed. None identified.  Blood-thinner therapy: None at this time Active  Infection(s): Reviewed. None identified. Claire [name] is afebrile  Site Confirmation: Claire [name] was asked to confirm the procedure and laterality before marking the site Procedure checklist: Completed Consent: Before the procedure and under the influence of no sedative(s), amnesic(s), or anxiolytics, the patient was informed of the treatment options, risks and possible complications. To fulfill  our ethical and legal obligations, as recommended by the American Medical Association's Code of Ethics, I have informed the patient of my clinical impression; the nature and purpose of the treatment or procedure; the risks, benefits, and possible complications of the intervention; the alternatives, including doing nothing; the risk(s) and benefit(s) of the alternative treatment(s) or procedure(s); and the risk(s) and benefit(s) of doing nothing. The patient was provided information about the general risks and possible complications associated with the procedure. These may include, but are not limited to: failure to achieve desired goals, infection, bleeding, organ or nerve damage, allergic reactions, paralysis, and death. In addition, the patient was informed of those risks and complications associated to the procedure, such as failure to decrease pain; infection; bleeding; organ or nerve damage with subsequent damage to sensory, motor, and/or autonomic systems, resulting in permanent pain, numbness, and/or weakness of one or several areas of the body; allergic reactions; (i.e.: anaphylactic reaction); and/or death. Furthermore, the patient was informed of those risks and complications associated with the medications. These include, but are not limited to: allergic reactions (i.e.: anaphylactic or anaphylactoid reaction(s)); adrenal axis suppression; blood sugar elevation that in diabetics may result in ketoacidosis or comma; water  retention that in patients with history of congestive heart failure may result in  shortness of breath, pulmonary edema, and decompensation with resultant heart failure; weight gain; swelling or edema; medication-induced neural toxicity; particulate matter embolism and blood vessel occlusion with resultant organ, and/or nervous system infarction; and/or aseptic necrosis of one or more joints. Finally, the patient was informed that Medicine is not an exact science; therefore, there is also the possibility of unforeseen or unpredictable risks and/or possible complications that may result in a catastrophic outcome. The patient indicated having understood very clearly. We have given the patient no guarantees and we have made no promises. Enough time was given to the patient to ask questions, all of which were answered to the patient's satisfaction. Ms. Durango has indicated that she wanted to continue with the procedure. Attestation: I, the ordering provider, attest that I have discussed with the patient the benefits, risks, side-effects, alternatives, likelihood of achieving goals, and potential problems during recovery for the procedure that I have provided informed consent. Date  Time: 08/19/2023 11:39 AM   Pre-Procedure Preparation:  Monitoring: As per clinic protocol. Respiration, ETCO2, SpO2, BP, heart rate and rhythm monitor placed and checked for adequate function Safety Precautions: Patient was assessed for positional comfort and pressure points before starting the procedure. Time-out: I initiated and conducted the Time-out before starting the procedure, as per protocol. The patient was asked to participate by confirming the accuracy of the Time Out information. Verification of the correct person, site, and procedure were performed and confirmed by me, the nursing staff, and the patient. Time-out conducted as per Joint Commission's Universal Protocol (UP.01.01.01). Time: 1207 Start Time: 1207 hrs.   Narrative                Start Time: 1207 hrs.  Standard Safety  Precautions: Protocol guidelines were followed. Aspiration was conducted prior to injection. At no point did I inject any substances, as a needle was being advanced. No attempts were made at seeking a paresthesia. Safe injection practices and needle disposal techniques used. Medications properly checked for expiration dates. SDV (single dose vial) medications used.  Local Anesthesia: Skin & deeper tissues infiltrated with local anesthetic. Appropriate amount of time allowed for local anesthetics to take effect.   Technical description:  The target  area was identified and the area prepped in the usual manner. The procedure needles were then advanced to the target area. Proper needle placement secured. Negative aspiration confirmed. Solution injected in intermittent fashion, asking for systemic symptoms every 0.5cc of injectate. The needles were then removed and the area cleansed, making sure to leave some of the prepping solution back to take advantage of its long term bactericidal properties.  Approx 15 trigger points injected with needling performed.  Muscle groups injected: Trapezius, cervical paraspinals, and latissimus dorsi  Vitals:   08/19/23 1151  BP: (!) 148/71  Pulse: 65  Resp: 18  Temp: (!) 97.5 F (36.4 C)  TempSrc: Temporal  SpO2: 98%  Weight: 223 lb (101.2 kg)  Height: 5' 1 (1.549 m)     End Time: 1213 hrs.   Post-operative Assessment:  Post-procedure Vital Signs:  Pulse/HCG Rate: 65  Temp: (!) 97.5 F (36.4 C) Resp: 18 BP: (!) 148/71 SpO2: 98 %  EBL: None  Complications: No immediate post-treatment complications observed by team, or reported by patient.  Note: The patient tolerated the entire procedure well. A repeat set of vitals were taken after the procedure and the patient was kept under observation following institutional policy, for this type of procedure. Post-procedural neurological assessment was performed, showing return to baseline, prior to discharge.  The patient was provided with post-procedure discharge instructions, including a section on how to identify potential problems. Should any problems arise concerning this procedure, the patient was given instructions to immediately contact us , at any time, without hesitation. In any case, we plan to contact the patient by telephone for a follow-up status report regarding this interventional procedure.  Comments:  No additional relevant information.   Plan of Care (POC)  Orders:  No orders of the defined types were placed in this encounter.   Medications ordered for procedure: Meds ordered this encounter  Medications   ropivacaine  (PF) 2 mg/mL (0.2%) (NAROPIN ) injection 9 mL   Medications administered: We administered ropivacaine  (PF) 2 mg/mL (0.2%).  See the medical record for exact dosing, route, and time of administration.    TPI- cervical, thoracic 08/19/23    Follow-up plan:   Return in about 2 weeks (around 09/02/2023) for Emmy Blanch, PPE.     Recent Visits Date Type Provider Dept  08/12/23 Office Visit Patel, Seema K, NP [facility]  Showing recent visits within past 90 days and meeting all other requirements Today's Visits Date Type Provider Dept  08/19/23 Procedure visit Marcelino Nurse, MD [facility]  Showing today's visits and meeting all other requirements Future Appointments Date Type Provider Dept  09/02/23 Appointment Patel, Seema K, NP [facility]  Showing future appointments within next 90 days and meeting all other requirements   Disposition: Discharge home  Discharge (Date  Time): 08/19/2023; 1214 hrs.   Primary Care Physician: Sowles, Krichna, MD Location: [facility] Note by: Nurse Marcelino, MD (TTS technology used. I apologize for any typographical errors that were not detected and corrected.) Date: 08/19/2023; Time: 12:25 PM  Disclaimer:  Medicine is not an Visual merchandiser. The only guarantee in  medicine is that nothing is guaranteed. It is important to note that the decision to proceed with this intervention was based on the information collected from the patient. The Data and conclusions were drawn from the patient's questionnaire, the interview, and the physical examination. Because the information was provided in large part by the patient, it cannot be guaranteed that it has  not been purposely or unconsciously manipulated. Every effort has been made to obtain as much relevant data as possible for this evaluation. It is important to note that the conclusions that lead to this procedure are derived in large part from the available data. Always take into account that the treatment will also be dependent on availability of resources and existing treatment guidelines, considered by other Pain Management Practitioners as being common knowledge and practice, at the time of the intervention. For Medico-Legal purposes, it is also important to point out that variation in procedural techniques and pharmacological choices are the acceptable norm. The indications, contraindications, technique, and results of the above procedure should only be interpreted and judged by a Board-Certified Interventional Pain Specialist with extensive familiarity and expertise in the same exact procedure and technique.

## 2023-08-19 NOTE — Patient Instructions (Signed)

## 2023-08-20 ENCOUNTER — Telehealth: Payer: Self-pay | Admitting: *Deleted

## 2023-08-20 NOTE — Telephone Encounter (Signed)
 No problems post procedure.

## 2023-08-21 NOTE — Progress Notes (Signed)
 [facility] Chief Complaint:   Chief Complaint  Patient presents with  . Right Wrist - Pain    History of Present Illness:    Claire [name] is a 53 y.o. female that presents to clinic today for follow up evaluation and management of chronic right wrist pain and swelling without memorable injury.  They were last evaluated by myself on 07/24/2023.  At that time, the plan was to perform a right wrist MRI due to chronic pain and swelling of unknown injury possibly related to soft tissue abnormality versus bone stress injury versus tendinitis.  She had her MRI on 07/30/2023 which only noted mild thumb CMC joint arthritis change.  She comes in today for reevaluation and discussion of treatment options moving forward.  There is no new data to be reviewed.  Today, the patient reports their symptoms are still persisting around her ulnar-sided palm proximally.she denies any radiation of pain into her fingers but sometimes she feels symptoms radiating up her forearm.  She currently rates pain severity as a 8/10.  She reports associated swelling, weakness, pain at night.  She denies associated finger locking/catching, instability, numbness or tingling, fevers or chills, night sweats, weight loss, skin color change.  She has tried topical diclofenac, modified workspace, brace with ongoing symptoms.   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  She used to work as a Engineer, structural.   She feels like she has significant limitation to her function due to her current symptoms.  Medications, Past Medical/Surgical/Family/Social History:   Current Outpatient Medications  Medication Sig Dispense Refill  . atorvastatin  (LIPITOR) 10 MG tablet Take 10 mg by mouth once daily    . budesonide -formoteroL  (SYMBICORT ) 160-4.5 mcg/actuation inhaler Inhale 2 inhalations into the lungs 2 (two) times daily    . calcium  citrate 250 mg calcium  Tab Take 250 mg by mouth  once daily    . cetirizine (ZYRTEC) 10 MG chewable tablet Take 10 mg by mouth once daily    . cyanocobalamin  (VITAMIN B12) 1000 MCG tablet Take 1,000 mcg by mouth every 7 (seven) days    . cyclobenzaprine  (FLEXERIL ) 10 MG tablet Take 10 mg by mouth once daily as needed.    . ergocalciferol , vitamin D2, 1,250 mcg (50,000 unit) capsule Take 50,000 Units by mouth once a week    . fluconazole  (DIFLUCAN ) 150 MG tablet Take 150 mg by mouth once daily    . HYDROcodone -acetaminophen  (NORCO) 5-325 mg tablet Take 1 tablet by mouth 2 (two) times daily as needed 14 tablet 0  . ketoconazole  (NIZORAL ) 2 % cream Apply 1 Application topically    . montelukast  (SINGULAIR ) 10 mg tablet Take 10 mg by mouth at bedtime    . multivitamin tablet Take 1 tablet by mouth once daily    . nystatin  (MYCOSTATIN ) 100,000 unit/gram powder Apply 1 Application topically 3 (three) times daily    . ondansetron  (ZOFRAN ) 4 MG tablet 1 TABLET BY MOUTH AS NEEDED NAUSEA MAY REPEAT AFTER 4 HOURS    . ondansetron  (ZOFRAN -ODT) 4 MG disintegrating tablet Take 1 tablet (4 mg total) by mouth every 8 (eight) hours as needed for Nausea May take TWO at a time IF NEEDED. 20 tablet 0  . OZEMPIC  2 mg/dose (8 mg/3 mL) pen injector INJECT 2 MG AS DIRECTED ONCE A WEEK.    . pantoprazole  (PROTONIX ) 40 MG DR tablet Take 40 mg by mouth once daily    . pregabalin  (LYRICA )  300 MG capsule Take 1 capsule by mouth 2 (two) times daily    . traZODone  (DESYREL ) 100 MG tablet Take 200 mg by mouth at bedtime    . TRINTELLIX  20 mg tablet Take 20 mg by mouth once daily    . VENTOLIN  HFA 90 mcg/actuation inhaler   0  . zonisamide (ZONEGRAN) 100 MG capsule Take 100 mg by mouth nightly.     No current facility-administered medications for this visit.    SECONDARY CONDITIONS THAT INFLUENCE TREATMENT AND DECISION-MAKING:  Former smoker   Past Medical History: Obesity, type 2 diabetes not on insulin , hyperlipidemia on statin, GERD on PPI, anxiety, fibromyalgia,  IBS, vitamin B12 deficiency, history of migraines   Past Surgical History: Cholecystectomy, sleeve gastrectomy   Relevant Orthopedic Family History: None  Physical Examination:   BP (!) 142/86   Ht 154.9 cm (5' 1)   Wt 99.8 kg (220 lb)   BMI 41.57 kg/m  General/Constitutional: Well-nourished, well developed, no apparent distress. Psych: Normal mood and affect.  Conversant.  Judgement intact. Musculoskeletal: Comprehensive Wrist Exam: Inspection   Right Left  Alignment Neutral Neutral  Skin No rashes, lesions, or wounds. No ecchymosis or erythema. No rashes, lesions, or wounds. No ecchymosis or erythema.  Soft Tissue + minimal focal soft tissue swelling over the ulnar sided proximal palm.    No obvious deformity.   No focal soft tissue swelling. No obvious deformity.      Palpation    Right Left  Tenderness + pisiform area, flexor carpi ulnaris   No snuff box tenderness.  No ulnar soft spot pain. No tendon, muscle, bony pain. No snuff box tenderness.  No ulnar soft spot pain.  Crepitus None None  Effusion No joint effusion No joint effusion    Range of Motion   Right Left  Pronation Full to 90 degrees Full to 90 degrees  Supination Full to 90 degrees Full to 90 degrees  Wrist Flexion Full to 70 degrees Full to 70 degrees  Wrist Extension Full to 70 degrees Full to 70 degrees    Strength   Right Left  Pronation 5/5 5/5  Supination 5/5 5/5  Wrist Flexion 5/5 with pain 5/5  Wrist Extension 5/5 5/5    Neurovascular   Right Left  Distal Motor Normal Normal  Distal Sensory Normal light touch sensation  Normal light touch sensation  Distal Pulses Normal Normal   Tests Performed/Ordered:   None  Tests Previously Reviewed:  EXAM: Right wrist MRI without contrast performed 07/30/2023 at [facility]  IMPRESSION:  Mild first CMC joint osteoarthritis.   I personally reviewed and visualized the imaging studies if available.   Assessment:     ICD-10-CM  1. Right wrist  pain  M25.531  2. Swelling of right wrist  M25.431  3. 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  The plan is to do the following;  - The patient has ongoing pain and swelling wrist/palm without memorable injury.  I discussed the possible causes of her symptoms related to arthritis versus tendinitis versus bone stress injury versus soft tissue abnormality versus peripheral nerve entrapment. - She still has some swelling along her ulnar-sided proximal palm in the area of the flexor carpi ulnaris attachment with tenderness to palpation there.  Pain with passive wrist extension.  I reviewed her MRI results with her which did not  show any obvious abnormality in her area of pain to explain her symptoms.  I discussed the possible causes of her symptoms with uncertain exact etiology of pain.  I explained the additional treatment options with diagnostic/therapeutic steroid injection versus second opinion versus topical pain cream/padding over her palm.  At this point, she is willing to try a diagnostic/therapeutic steroid injection.  See procedure note for full details. - Daily activity as tolerated. Modify as needed according to symptoms. No limitations to weight bearing.  She can use her short arm wrist brace if this seems to help the area but otherwise she may be fine not using it and just putting a padding over her sensitive area. - Use Tylenol , anti-inflammatories, topical diclofenac/pain cream, relative rest, compression, and ice/heat as needed for pain.   - Follow up as needed in 3-4 weeks depending on response to injection.  If she has persistent symptoms, then she may need a second opinion with a hand specialist versus diagnostic/therapeutic injection around the ulnar nerve at Guyon's canal versus further evaluation with EMG.  RIGHT ultrasound guided Flexor  Carpi Ulnaris Injection   Consent After discussing the various treatment options for the condition,  It was agreed that a corticosteroid injection would be the next step in treatment.  The nature of and the indications for a corticosteroid and / or local anaesthetic injection were reviewed in detail with the patient today.  The inherent risks of injection including infection, allergic reaction, increased pain, incomplete relief or temporary relief of symptoms, alterations of blood glucose levels requiring careful monitoring and treatment as indicated, tendon, ligament or articular cartilage rupture or degeneration, nerve injury, skin depigmentation, and/or fatty atrophy were discussed.     Indication for Ultrasound Diagnostic injection where accurate injectate placement is critical for diagnosis.   Procedure After the risks and benefits of the procedure were explained, consent was given, and time-out was performed.  The right FCU and surrounding structures were visualized with ultrasound. There was no obvious tenosynovial fluid. The site for the injection was properly marked and prepped with Chlorhexadine/Isopropyl alcohol solution.      The injection site was anesthetized with ethyl chloride. Using ultrasound guidance, the FCU tendon sheath near its attachment was visualized and injected with 3 milligrams of Betamethasone, 0.5 milliliters of 0.25% Bupivacaine , and 0.5 milliliters of 1% Lidocaine  using a sterile technique and a 25 gauge 1.5 inch needle. During injection, there was unrestricted flow and care was taken not to inject corticosteroid into the skin or subcutaneous tissues.    A sterile band-aide was applied.  Post-injection instructions were given regarding post-procedure care, when to follow up in clinic and what to expect from the procedure.  The patient tolerated the injection well and was discharged without complication.     Contact our office with any questions or concerns.  Follow  up as indicated, or sooner should any new problems arise, if conditions worsen, or if they are otherwise concerned.    Prentice Reges, DO [facility] [address] [place], [place] [address] Phone: [phone]   This note was generated in part with voice recognition software and I apologize for any typographical errors that were not detected and corrected.  Hand / Upper Extremity Injection/Arthrocentesis: Right Flexor Carpi Ulnaris Injection  Date/Time: 08/21/2023 2:00 PM  Performed by: Reges Prentice, DO Authorized by: Reges Prentice, DO   Procedure discussed: discussed risks, benefits, and alternatives   Consent Given by:  Patient Timeout: timeout called immediately  prior to procedure   Prep: patient was prepped and draped in usual sterile fashion   Indications:  Pain Needle Size:  25 G Guidance: ultrasound   Medications:  0.5 mL BUPivacaine  HCl 0.25 %; 0.5 mL lidocaine  1 %; 3 mg betamethasone acetate-betamethasone sodium phosphate  6 mg/mL

## 2023-09-02 ENCOUNTER — Encounter: Payer: Self-pay | Admitting: Nurse Practitioner

## 2023-09-02 ENCOUNTER — Ambulatory Visit: Payer: PRIVATE HEALTH INSURANCE | Attending: Nurse Practitioner | Admitting: Nurse Practitioner

## 2023-09-02 VITALS — BP 126/78 | HR 73 | Temp 98.1°F | Resp 16 | Ht 61.0 in | Wt 220.0 lb

## 2023-09-02 DIAGNOSIS — M4316 Spondylolisthesis, lumbar region: Secondary | ICD-10-CM | POA: Insufficient documentation

## 2023-09-02 DIAGNOSIS — M25511 Pain in right shoulder: Secondary | ICD-10-CM | POA: Insufficient documentation

## 2023-09-02 DIAGNOSIS — M7918 Myalgia, other site: Secondary | ICD-10-CM | POA: Insufficient documentation

## 2023-09-02 DIAGNOSIS — M545 Low back pain, unspecified: Secondary | ICD-10-CM | POA: Diagnosis not present

## 2023-09-02 DIAGNOSIS — G894 Chronic pain syndrome: Secondary | ICD-10-CM | POA: Diagnosis not present

## 2023-09-02 DIAGNOSIS — M797 Fibromyalgia: Secondary | ICD-10-CM | POA: Insufficient documentation

## 2023-09-02 DIAGNOSIS — Z6841 Body Mass Index (BMI) 40.0 and over, adult: Secondary | ICD-10-CM | POA: Diagnosis present

## 2023-09-02 DIAGNOSIS — G8929 Other chronic pain: Secondary | ICD-10-CM | POA: Diagnosis present

## 2023-09-02 MED ORDER — NALTREXONE HCL 50 MG PO TABS: 25.0000 mg | ORAL_TABLET | Freq: Every day | ORAL | 0 refills | Status: DC

## 2023-09-02 NOTE — Progress Notes (Signed)
 Safety precautions to be maintained throughout the outpatient stay will include: orient to surroundings, keep bed in low position, maintain call bell within reach at all times, provide assistance with transfer out of bed and ambulation.

## 2023-09-02 NOTE — Progress Notes (Signed)
 PROVIDER NOTE: Interpretation of information contained herein should be left to medically-trained personnel. Specific patient instructions are provided elsewhere under Patient Instructions section of medical record. This document was created in part using AI and STT-dictation technology, any transcriptional errors that may result from this process are unintentional.  Patient: Claire [name]  Service: E/M   PCP: Sowles, Krichna, MD  DOB: 10-Jun-1970  DOS: 09/02/2023  Provider: Emmy MARLA Blanch, NP  MRN: [id number]  Delivery: Face-to-face  Specialty: Interventional Pain Management  Type: Established Patient  Setting: Ambulatory outpatient facility  Specialty designation: 09  Referring Prov.: Sowles, Krichna, MD  Location: Outpatient office facility       History of present illness (HPI) Claire [name], a 53 y.o. year old female, is here today because of her Chronic pain syndrome [G89.4]. Claire [name] primary complain today is Arm Pain (Right bicep ), Neck Pain (Bilateral ), Back Pain (Lower bilateral worse on the right ), and Hip Pain (Right )  Pertinent problems: Claire [name] has Right shoulder pain, Bilateral low back pain, Generalized body pain; Fibromyalgia; Chronic pain syndrome; Carpal tunnel syndrome (Right); Myofascial pain syndrome and Back pain (Bilateral) (R>L) on their pertinent problem list.  Pain Assessment: Severity of Chronic pain is reported as a 7 /10. Location: Arm (bilateral neck,, see visit info for all pain sites) Right/? arm pain on the right coming from neck and ? back pain into right hip. Onset: More than a month ago. Quality: Discomfort, Sharp, Constant, Pressure, Squeezing. Timing: Constant. Modifying factor(s): rest, heating pad. Vitals:  height is 5' 1 (1.549 m) and weight is 220 lb (99.8 kg). Her temporal temperature is 98.1 F (36.7 C). Her blood pressure is 126/78 and her pulse is 73. Her respiration is 16 and oxygen saturation is 100%.  BMI: Estimated body  mass index is 41.57 kg/m as calculated from the following:   Height as of this encounter: 5' 1 (1.549 m).   Weight as of this encounter: 220 lb (99.8 kg).  Last encounter: 08/12/2023. Last procedure: 08/19/2023  Reason for encounter: both, medication management and post-procedure evaluation and assessment. No change in medical history since last visit.  Patient's pain is at baseline.  Patient continues multimodal pain regimen as prescribed.  States that it provides pain relief and improvement in functional status.   Claire [name] received a cervical and trapezius trigger point injection on August 19, 2023.  She reports minimal improvement with approximately 50% pain relief however she continues to have a pain on her right biceps at the end of the day.   We discussed about low-dose naltrexone  therapy for Fibromyalgia pain.  Patient agreed with the plan  Procedure Type:  Cervical and Trapezius Trigger Point Injection (Myoneural Block)  CPT: [phone] Laterality: Bilateral (-50)    Imaging: N/A. Landmark-guided,           Anesthesia: Local anesthesia (1-2% Lidocaine ) DOS: 08/19/2023  Performed by: Wallie Sherry, MD  Post-Procedure Evaluation   Effectiveness:  Initial hour after procedure: 0 % . Subsequent 4-6 hours post-procedure: 0 % . Analgesia past initial 6 hours: 50 % (right bicep continues to have pain and by the end of the day the pain is severe) . Ongoing improvement:  Analgesic: Claire [name] received a cervical and trapezius trigger point injection on August 19, 2023.  She reports minimal improvement with approximately 50% pain relief however she continues to have a pain on her right biceps at the end of the day.  Function: Minimal improvement ROM:  Minimal improvement  Pharmacotherapy Assessment   Analgesic: Naltrexone  (Depade) 25 mg total by mouth daily. Monitoring: [place] PMP: PDMP reviewed during this encounter.       Pharmacotherapy: No side-effects or adverse reactions  reported. Compliance: No problems identified. Effectiveness: Clinically acceptable.  Jakie Chrissie MATSU, RN  09/02/2023  8:55 AM  Sign when Signing Visit Safety precautions to be maintained throughout the outpatient stay will include: orient to surroundings, keep bed in low position, maintain call bell within reach at all times, provide assistance with transfer out of bed and ambulation.     UDS:  No results found for: SUMMARY  No results found for: CBDTHCR No results found for: D8THCCBX No results found for: D9THCCBX  ROS  Constitutional: Denies any fever or chills Gastrointestinal: No reported hemesis, hematochezia, vomiting, or acute GI distress Musculoskeletal: Low back pain bilateral (R>L), neck pain, arm pain (biceps) Neurological: No reported episodes of acute onset apraxia, aphasia, dysarthria, agnosia, amnesia, paralysis, loss of coordination, or loss of consciousness  Medication Review  Calcium  Citrate-Vitamin D , Iron-Vitamin C, Lactobacillus, Multivitamin Adult, Semaglutide  (2 MG/DOSE), Vitamin D  (Ergocalciferol ), aspirin  EC, atorvastatin , budesonide -formoterol , clobetasol cream, cyanocobalamin , cyclobenzaprine , fluconazole , ketoconazole , mometasone , montelukast , naltrexone , nystatin , ondansetron , pantoprazole , pregabalin , traZODone , triamcinolone  cream, vortioxetine  HBr, and zonisamide  History Review  Allergy: Claire [name] is allergic to aspartame and phenylalanine, gabapentin, glucose, linzess  [linaclotide ], other, triamcinolone , cymbalta  [duloxetine  hcl], tape, and tapentadol. Drug: Claire [name]  reports no history of drug use. Alcohol:  reports current alcohol use. Tobacco:  reports that she quit smoking about 30 years ago. Her smoking use included cigarettes. She started smoking about 40 years ago. She has a 10 pack-year smoking history. She has never used smokeless tobacco. Social: Claire [name]  reports that she quit smoking about 30 years ago. Her smoking  use included cigarettes. She started smoking about 40 years ago. She has a 10 pack-year smoking history. She has never used smokeless tobacco. She reports current alcohol use. She reports that she does not use drugs. Medical:  has a past medical history of Anemia, Anxiety, Arthritis, Asthma, Bilateral leg cramps, Depression, Diabetes mellitus without complication (HCC), DJD (degenerative joint disease), Dyspnea on exertion, GERD (gastroesophageal reflux disease), Headache, IBS (irritable bowel syndrome), Kidney stone, Low serum vitamin B12, Lung nodules, Migraine, Muscle spasm, Nonobstructive CAD (coronary artery disease), Psoriasis, Seasonal allergies, Sleep apnea, Vasovagal syncope, and Vitamin D  deficiency. Surgical: Claire [name]  has a past surgical history that includes Laparoscopic gastric sleeve resection (2012); Cholecystectomy (2010); Bladder surgery (2010); Plantar fascia surgery (Bilateral); Breast biopsy (Right, 2014); Colonoscopy (2014 ); Upper gi endoscopy; Tonsillectomy; laparoscopy (N/A, 06/16/2015); Robotic assisted salpingo oophorectomy (Right, 06/16/2015); Dilatation & currettage/hysteroscopy with novasure ablation (N/A, 06/16/2015); Lysis of adhesion (N/A, 06/16/2015); Colonoscopy with propofol  (N/A, 01/28/2018); Esophagogastroduodenoscopy (egd) with propofol  (N/A, 01/28/2018); Lithotripsy (12/11/2017); laparoscopy (N/A, 04/01/2018); Gastric Roux-En-Y (N/A, 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 Family: family history includes Breast cancer in her paternal aunt and paternal grandmother; Heart attack in her maternal uncle, mother, paternal grandfather, and sister; Hyperlipidemia in her father; Lung cancer in her father and maternal grandfather; Multiple myeloma in her mother; Stroke in her mother.  Laboratory Chemistry Profile   Renal Lab Results  Component Value Date   BUN 14 01/11/2023   CREATININE 0.68 01/11/2023   LABCREA 153 01/11/2023   BCR SEE NOTE: 01/11/2023    GFRAA >60 11/04/2019   GFRNONAA >60 11/04/2019    Hepatic Lab Results  Component Value Date   AST 62 (H) 01/11/2023  ALT 77 (H) 01/11/2023   ALBUMIN 4.1 11/03/2019   ALKPHOS 67 11/03/2019   LIPASE 30 04/02/2019    Electrolytes Lab Results  Component Value Date   NA 135 01/11/2023   K 4.0 01/11/2023   CL 101 01/11/2023   CALCIUM  9.7 01/11/2023   MG 1.8 11/04/2019    Bone Lab Results  Component Value Date   VD25OH 31 01/11/2023    Inflammation (CRP: Acute Phase) (ESR: Chronic Phase) No results found for: CRP, ESRSEDRATE, LATICACIDVEN       Note: Above Lab results reviewed.  Recent Imaging Review  MR WRIST RIGHT WO CONTRAST EXAM DESCRIPTION: MR WRIST RIGHT WO CONTRAST  CLINICAL HISTORY: Pain.  COMPARISON: None Available.  TECHNIQUE: MRI of the wrist is performed according to our usual protocol with multiplanar multi sequence imaging.  FINDINGS: No fracture. No erosions. Mild first CMC joint osteoarthritis with joint space narrowing and chondrolysis. Small osteophytes. No significant degenerative edema. The marrow signal is unremarkable. The alignment is unremarkable.  No significant joint effusion. The ligaments are intact. The flexor tendons and carpal tunnel are unremarkable. The extensor tendons and musculature are unremarkable. No subcutaneous edema.  IMPRESSION: Mild first CMC joint osteoarthritis.  Electronically signed by: Reyes Frees MD 07/30/2023 10:23 PM EDT RP Workstation: [id number] Note: Reviewed        Physical Exam  Vitals: BP 126/78 (BP Location: Right Arm, Patient Position: Sitting, Cuff Size: Normal)   Pulse 73   Temp 98.1 F (36.7 C) (Temporal)   Resp 16   Ht 5' 1 (1.549 m)   Wt 220 lb (99.8 kg)   SpO2 100%   BMI 41.57 kg/m  BMI: Estimated body mass index is 41.57 kg/m as calculated from the following:   Height as of this encounter: 5' 1 (1.549 m).   Weight as of this encounter: 220 lb (99.8 kg). Ideal: Ideal body  weight: 47.8 kg (105 lb 6.1 oz) Adjusted ideal body weight: 68.6 kg (151 lb 3.6 oz) General appearance: Well nourished, well developed, and well hydrated. In no apparent acute distress Mental status: Alert, oriented x 3 (person, place, & time)       Respiratory: No evidence of acute respiratory distress Eyes: PERLA   Assessment   Diagnosis Status  1. Chronic pain syndrome   2. Myofascial pain syndrome   3. Lumbosacral pain   4. Fibromyalgia   5. Morbid obesity with BMI of 40.0-44.9, adult (HCC)   6. Chronic right shoulder pain   7. Spondylolisthesis of lumbar region    Controlled Controlled Controlled   Updated Problems: No problems updated.  Plan of Care  Problem-specific:  Assessment and Plan Started on naltrexone  on 25 Mg tablet daily Prescribing drug monitoring (PDMP) reviewed.  Schedule follow-up in 1 month for medication management evaluation.    Claire [name] has a current medication list which includes the following long-term medication(s): atorvastatin , budesonide -formoterol , calcium  citrate chewy bite, mometasone , montelukast , pregabalin , trazodone , and zonisamide.  Pharmacotherapy (Medications Ordered): Meds ordered this encounter  Medications   naltrexone  (DEPADE) 50 MG tablet    Sig: Take 0.5 tablets (25 mg total) by mouth daily.    Dispense:  15 tablet    Refill:  0   Orders:  No orders of the defined types were placed in this encounter.       Return in about 1 month (around 10/03/2023) for (F2F), (MM), Emmy Blanch NP.    Recent Visits Date Type Provider Dept  08/19/23 Procedure visit Marcelino Nurse,  MD [facility]  08/12/23 Office Visit Frandy Basnett K, NP [facility]  Showing recent visits within past 90 days and meeting all other requirements Today's Visits Date Type Provider Dept  09/02/23 Office Visit Arthi Mcdonald K, NP [facility]  Showing today's visits and meeting all other requirements Future  Appointments Date Type Provider Dept  09/23/23 Appointment Aldous Housel K, NP [facility]  Showing future appointments within next 90 days and meeting all other requirements  I discussed the assessment and treatment plan with the patient. The patient was provided an opportunity to ask questions and all were answered. The patient agreed with the plan and demonstrated an understanding of the instructions.  Patient advised to call back or seek an in-person evaluation if the symptoms or condition worsens.  Duration of encounter: 30 minutes.  Total time on encounter, as per AMA guidelines included both the face-to-face and non-face-to-face time personally spent by the physician and/or other qualified health care professional(s) on the day of the encounter (includes time in activities that require the physician or other qualified health care professional and does not include time in activities normally performed by clinical staff). Physician's time may include the following activities when performed: Preparing to see the patient (e.g., pre-charting review of records, searching for previously ordered imaging, lab work, and nerve conduction tests) Review of prior analgesic pharmacotherapies. Reviewing PMP Interpreting ordered tests (e.g., lab work, imaging, nerve conduction tests) Performing post-procedure evaluations, including interpretation of diagnostic procedures Obtaining and/or reviewing separately obtained history Performing a medically appropriate examination and/or evaluation Counseling and educating the patient/family/caregiver Ordering medications, tests, or procedures Referring and communicating with other health care professionals (when not separately reported) Documenting clinical information in the electronic or other health record Independently interpreting results (not separately reported) and communicating results to the patient/ family/caregiver Care coordination (not  separately reported)  Note by: Tyauna Lacaze K Aeris Hersman, NP (TTS and AI technology used. I apologize for any typographical errors that were not detected and corrected.) Date: 09/02/2023; Time: 9:59 AM

## 2023-09-23 ENCOUNTER — Ambulatory Visit: Payer: PRIVATE HEALTH INSURANCE | Attending: Nurse Practitioner | Admitting: Nurse Practitioner

## 2023-09-23 ENCOUNTER — Encounter: Payer: Self-pay | Admitting: Nurse Practitioner

## 2023-09-23 VITALS — BP 139/83 | Temp 97.7°F | Ht 61.0 in | Wt 217.0 lb

## 2023-09-23 DIAGNOSIS — M797 Fibromyalgia: Secondary | ICD-10-CM | POA: Diagnosis present

## 2023-09-23 DIAGNOSIS — G894 Chronic pain syndrome: Secondary | ICD-10-CM | POA: Diagnosis present

## 2023-09-23 DIAGNOSIS — M4316 Spondylolisthesis, lumbar region: Secondary | ICD-10-CM | POA: Insufficient documentation

## 2023-09-23 DIAGNOSIS — Z6841 Body Mass Index (BMI) 40.0 and over, adult: Secondary | ICD-10-CM | POA: Diagnosis present

## 2023-09-23 DIAGNOSIS — M7918 Myalgia, other site: Secondary | ICD-10-CM | POA: Insufficient documentation

## 2023-09-23 DIAGNOSIS — G8929 Other chronic pain: Secondary | ICD-10-CM | POA: Insufficient documentation

## 2023-09-23 DIAGNOSIS — M25511 Pain in right shoulder: Secondary | ICD-10-CM | POA: Insufficient documentation

## 2023-09-23 DIAGNOSIS — J4530 Mild persistent asthma, uncomplicated: Secondary | ICD-10-CM | POA: Diagnosis present

## 2023-09-23 DIAGNOSIS — I700 Atherosclerosis of aorta: Secondary | ICD-10-CM | POA: Insufficient documentation

## 2023-09-23 DIAGNOSIS — M47816 Spondylosis without myelopathy or radiculopathy, lumbar region: Secondary | ICD-10-CM | POA: Insufficient documentation

## 2023-09-23 DIAGNOSIS — M545 Low back pain, unspecified: Secondary | ICD-10-CM | POA: Insufficient documentation

## 2023-09-23 MED ORDER — NALTREXONE HCL 50 MG PO TABS: 50.0000 mg | ORAL_TABLET | Freq: Every day | ORAL | 0 refills | Status: DC

## 2023-09-23 NOTE — Progress Notes (Signed)
 Safety precautions to be maintained throughout the outpatient stay will include: orient to surroundings, keep bed in low position, maintain call bell within reach at all times, provide assistance with transfer out of bed and ambulation.

## 2023-09-23 NOTE — Progress Notes (Signed)
 PROVIDER NOTE: Interpretation of information contained herein should be left to medically-trained personnel. Specific patient instructions are provided elsewhere under Patient Instructions section of medical record. This document was created in part using AI and STT-dictation technology, any transcriptional errors that may result from this process are unintentional.  Patient: Claire [name]  Service: E/M   PCP: Sowles, Krichna, MD  DOB: April 20, 1970  DOS: 09/23/2023  Provider: Emmy MARLA Blanch, NP  MRN: [id number]  Delivery: Face-to-face  Specialty: Interventional Pain Management  Type: Established Patient  Setting: Ambulatory outpatient facility  Specialty designation: 09  Referring Prov.: Sowles, Krichna, MD  Location: Outpatient office facility       History of present illness (HPI) Claire [name], a 53 y.o. 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 Claire [name] primary complain today is Back Pain (lower)  Pertinent problems: Claire [name] has has Right shoulder pain, Bilateral low back pain, Generalized body pain; Fibromyalgia; Chronic pain syndrome; Carpal tunnel syndrome (Right); Myofascial pain syndrome; Spondylosis of lumbar region without myelopathy or radiculopathy; and Back pain (Bilateral) (R>L) on their pertinent problem list.   Pain Assessment: Severity of Chronic pain is reported as a 8 /10. Location: Back Lower/Back pain into right hip and down right leg. Onset: More than a month ago. Quality: Aching, Constant, Burning, Throbbing, Pressure, Discomfort. Timing: Constant. Modifying factor(s): Rest and heating pad. Vitals:  height is 5' 1 (1.549 m) and weight is 217 lb (98.4 kg). Her temperature is 97.7 F (36.5 C). Her blood pressure is 139/83. Her oxygen saturation is 98%.  BMI: Estimated body mass index is 41 kg/m as calculated from the following:   Height as of this encounter: 5' 1 (1.549  m).   Weight as of this encounter: 217 lb (98.4 kg).  Last encounter: 09/02/2023. Last procedure: 08/19/2023  Reason for encounter: evaluation for possible interventional PM therapy/treatment and medication management. No change in medical history since last visit.  Patient's pain is at baseline.  Patient continues multimodal pain regimen as prescribed.  States that it provides pain relief and improvement in functional status.   Claire [name] has undergone conservative treatments, including facet blocks epidural injection, TENS unit, and physical therapy.  The last lumbar radiofrequency ablation (RFA), performed on April 17, 2023 with Dr. Carlin at [facility], provided approximately 75% pain relief on the left side and 50% pain grip on the right side for about 3 to 4 months.  Due to her recent flareup we discussed repeating Lumbar radiofrequency ablation (RFA) # 2 and patient agreed with the plan.  Pharmacotherapy Assessment   Analgesic: Naltrexone  (Depade) 50 mg total by mouth daily. Monitoring: [place] PMP: PDMP reviewed during this encounter.       Pharmacotherapy: No side-effects or adverse reactions reported. Compliance: No problems identified. Effectiveness: Clinically acceptable.  Delores Dorothe LABOR, RN  09/23/2023  8:46 AM  Sign when Signing Visit Safety precautions to be maintained throughout the outpatient stay will include: orient to surroundings, keep bed in low position, maintain call bell within reach at all times, provide assistance with transfer out of bed and ambulation.     UDS:  No results found for: SUMMARY  No results found for: CBDTHCR No results found for: D8THCCBX No results found for: D9THCCBX  ROS  Constitutional: Denies any fever or chills Gastrointestinal: No reported hemesis, hematochezia, vomiting, or acute GI distress Musculoskeletal: Denies any acute onset joint swelling, redness, loss of ROM, or  weakness Neurological: No reported episodes of  acute onset apraxia, aphasia, dysarthria, agnosia, amnesia, paralysis, loss of coordination, or loss of consciousness  Medication Review  Calcium  Citrate-Vitamin D , Iron-Vitamin C, Lactobacillus, Multivitamin Adult, Semaglutide  (2 MG/DOSE), Vitamin D  (Ergocalciferol ), aspirin  EC, atorvastatin , budesonide -formoterol , clobetasol cream, cyanocobalamin , cyclobenzaprine , fluconazole , ketoconazole , mometasone , montelukast , naltrexone , nystatin , ondansetron , pantoprazole , pregabalin , traZODone , triamcinolone  cream, vortioxetine  HBr, and zonisamide  History Review  Allergy: Claire [name] is allergic to aspartame and phenylalanine, gabapentin, glucose, linzess  [linaclotide ], other, triamcinolone , cymbalta  [duloxetine  hcl], tape, and tapentadol. Drug: Claire [name]  reports no history of drug use. Alcohol:  reports current alcohol use. Tobacco:  reports that she quit smoking about 30 years ago. Her smoking use included cigarettes. She started smoking about 40 years ago. She has a 10 pack-year smoking history. She has never used smokeless tobacco. Social: Claire [name]  reports that she quit smoking about 30 years ago. Her smoking use included cigarettes. She started smoking about 40 years ago. She has a 10 pack-year smoking history. She has never used smokeless tobacco. She reports current alcohol use. She reports that she does not use drugs. Medical:  has a past medical history of Anemia, Anxiety, Arthritis, Asthma, Bilateral leg cramps, Depression, Diabetes mellitus without complication (HCC), DJD (degenerative joint disease), Dyspnea on exertion, GERD (gastroesophageal reflux disease), Headache, IBS (irritable bowel syndrome), Kidney stone, Low serum vitamin B12, Lung nodules, Migraine, Muscle spasm, Nonobstructive CAD (coronary artery disease), Psoriasis, Seasonal allergies, Sleep apnea, Vasovagal syncope, and Vitamin D  deficiency. Surgical: Claire [name]  has a past surgical history that includes Laparoscopic  gastric sleeve resection (2012); Cholecystectomy (2010); Bladder surgery (2010); Plantar fascia surgery (Bilateral); Breast biopsy (Right, 2014); Colonoscopy (2014 ); Upper gi endoscopy; Tonsillectomy; laparoscopy (N/A, 06/16/2015); Robotic assisted salpingo oophorectomy (Right, 06/16/2015); Dilatation & currettage/hysteroscopy with novasure ablation (N/A, 06/16/2015); Lysis of adhesion (N/A, 06/16/2015); Colonoscopy with propofol  (N/A, 01/28/2018); Esophagogastroduodenoscopy (egd) with propofol  (N/A, 01/28/2018); Lithotripsy (12/11/2017); laparoscopy (N/A, 04/01/2018); Gastric Roux-En-Y (N/A, 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 Family: family history includes Breast cancer in her paternal aunt and paternal grandmother; Heart attack in her maternal uncle, mother, paternal grandfather, and sister; Hyperlipidemia in her father; Lung cancer in her father and maternal grandfather; Multiple myeloma in her mother; Stroke in her mother.  Laboratory Chemistry Profile   Renal Lab Results  Component Value Date   BUN 14 01/11/2023   CREATININE 0.68 01/11/2023   LABCREA 153 01/11/2023   BCR SEE NOTE: 01/11/2023   GFRAA >60 11/04/2019   GFRNONAA >60 11/04/2019    Hepatic Lab Results  Component Value Date   AST 62 (H) 01/11/2023   ALT 77 (H) 01/11/2023   ALBUMIN 4.1 11/03/2019   ALKPHOS 67 11/03/2019   LIPASE 30 04/02/2019    Electrolytes Lab Results  Component Value Date   NA 135 01/11/2023   K 4.0 01/11/2023   CL 101 01/11/2023   CALCIUM  9.7 01/11/2023   MG 1.8 11/04/2019    Bone Lab Results  Component Value Date   VD25OH 31 01/11/2023    Inflammation (CRP: Acute Phase) (ESR: Chronic Phase) No results found for: CRP, ESRSEDRATE, LATICACIDVEN       Note: Above Lab results reviewed.  Recent Imaging Review  MR WRIST RIGHT WO CONTRAST EXAM DESCRIPTION: MR WRIST RIGHT WO CONTRAST  CLINICAL HISTORY: Pain.  COMPARISON: None Available.  TECHNIQUE: MRI of the wrist is  performed according to our usual protocol with multiplanar multi sequence imaging.  FINDINGS: No fracture. No erosions. Mild first CMC joint osteoarthritis  with joint space narrowing and chondrolysis. Small osteophytes. No significant degenerative edema. The marrow signal is unremarkable. The alignment is unremarkable.  No significant joint effusion. The ligaments are intact. The flexor tendons and carpal tunnel are unremarkable. The extensor tendons and musculature are unremarkable. No subcutaneous edema.  IMPRESSION: Mild first CMC joint osteoarthritis.  Electronically signed by: Reyes Frees MD 07/30/2023 10:23 PM EDT RP Workstation: [id number] Note: Reviewed        Physical Exam  Vitals: BP 139/83   Temp 97.7 F (36.5 C)   Ht 5' 1 (1.549 m)   Wt 217 lb (98.4 kg)   SpO2 98%   BMI 41.00 kg/m  BMI: Estimated body mass index is 41 kg/m as calculated from the following:   Height as of this encounter: 5' 1 (1.549 m).   Weight as of this encounter: 217 lb (98.4 kg). Ideal: Ideal body weight: 47.8 kg (105 lb 6.1 oz) Adjusted ideal body weight: 68.1 kg (150 lb 0.5 oz) General appearance: Well nourished, well developed, and well hydrated. In no apparent acute distress Mental status: Alert, oriented x 3 (person, place, & time)       Respiratory: No evidence of acute respiratory distress Eyes: PERLA  Lumbar Exam  Skin & Axial Inspection: No masses, redness, or swelling Alignment: Symmetrical Functional ROM: Pain restricted ROM affecting both sides Stability: No instability detected Muscle Tone/Strength: Functionally intact. No obvious neuro-muscular anomalies detected. Sensory (Neurological): Musculoskeletal pain pattern Palpation: No palpable anomalies       Provocative Tests: Hyperextension/rotation test: (+) bilaterally for facet joint pain. Lumbar quadrant test (Kemp's test): (+) bilaterally for facet joint pain. Lateral bending test: deferred today       Patrick's  Maneuver: deferred today                   FABER* test: deferred today                   S-I anterior distraction/compression test: deferred today         S-I lateral compression test: deferred today         S-I Thigh-thrust test: deferred today         S-I Gaenslen's test: deferred today         *(Flexion, ABduction and External Rotation)             Upper Extremity (UE) Exam          Right   Left  Inspection       Skin color, temperature, and hair growth are WNL. No peripheral edema or cyanosis. No masses, redness, swelling, asymmetry, or associated skin lesions. No contractures.   Skin color, temperature, and hair growth are WNL. No peripheral edema or cyanosis. No masses, redness, swelling, asymmetry, or associated skin lesions. No contractures.                 Functional ROM       ROM is within normal limits (WNL) for shoulder   ROM is within normal limits (WNL) for shoulder                 Muscle Tone/Strength       Functionally intact. No obvious neuro-muscular anomalies detected.   Functionally intact. No obvious neuro-muscular anomalies detected.                 Sensory (Neurological)       Musculoskeletal pain pattern affecting the shoulder   No anomaly detected  Palpation       Tender               No palpable anomalies                             Maneuver Shoulder abduction (deltoid/supraspinatus, axillary/supra scapular n,, C5) Elbow flexion (biceps brachial, musculoskeletal n, C5-6) Elbow extension (triceps, radial n, C7) Finger abduction (interossei, ulnar n, T1)       Shoulder abduction (deltoid/supraspinatus, axillary/supra scapular n,, C5) Elbow flexion (biceps brachial, musculoskeletal n, C5-6) Elbow extension (triceps, radial n, C7) Wrist extensors (C6) Finger extensors (C8) Finger abduction (interossei, ulnar n, T1)                     Provocative Test       Phalen's test: deferred Tinel's test: deferred Apley's scratch test (touch  opposite shoulder):  Action 1 (Across chest): deferred Action 2 (Overhead): Adequate ROM Action 3 (LB reach): Adequate ROM   Phalen's test: deferred Tinel's test: deferred Apley's scratch test (touch opposite shoulder):  Action 1 (Across chest): deferred Action 2 (Overhead): Adequate ROM Action 3 (LB reach): Adequate ROM                       Level   Myotome   Dermatome   Sclerotome   ROM  C5   Elbow flexion   Lateral upper arm          C6   Wrist extension   Thumb and index          C7   Elbow extension   Middle finger          C8   Finger extension   Ring and pinky finger          T1   Finger abduction   Medial elbow and axilla          Assessment   Diagnosis Status  1. Spondylosis of lumbar region without myelopathy or radiculopathy   2. Spondylolisthesis of lumbar region   3. Lumbosacral pain   4. Fibromyalgia   5. Chronic pain syndrome   6. Myofascial pain syndrome   7. Morbid obesity with BMI of 40.0-44.9, adult (HCC)   8. Chronic right shoulder pain   9. Mild persistent asthma without complication   10. Atherosclerosis of aorta (HCC)    Having a Flare-up Having a Flare-up Persistent   Updated Problems: Problem  Spondylosis of Lumbar Region Without Myelopathy Or Radiculopathy    Plan of Care  Problem-specific:  Assessment and Plan The patient reports that the 25 Mg dose of naltrexone  wears off by the evening.  Plan to increase increase the dose Naltrexone  (Depade) 50 mg total by mouth daily for improved symptoms control.  PMP reviewed.  Schedule follow-up in 1 month for medication evaluation with Emmy Blanch, NP.  Plan: Lumbar RFA # 2 with Dr. Marcelino     Claire [name] has a current medication list which includes the following long-term medication(s): atorvastatin , budesonide -formoterol , calcium  citrate chewy bite, mometasone , montelukast , pregabalin , trazodone , and zonisamide.  Pharmacotherapy (Medications Ordered): Meds ordered this encounter   Medications   naltrexone  (DEPADE) 50 MG tablet    Sig: Take 1 tablet (50 mg total) by mouth daily.    Dispense:  30 tablet    Refill:  0   Orders:  Orders Placed This Encounter  Procedures   Radiofrequency,Lumbar  Standing Status:   Future    Expiration Date:   12/24/2023    Scheduling Instructions:     Side(s): Bilateral     Level(s): L2, L3, L4, L5, & S1 Medial Branch Nerve(s)     Sedation: With Sedation     Scheduling Timeframe: As soon as pre-approved    Where will this procedure be performed?:   [facility]        Return in about 1 month (around 10/24/2023) for (F2F), (MM), Emmy Blanch NP.    Recent Visits Date Type Provider Dept  09/02/23 Office Visit Zhara Gieske K, NP [facility]  08/19/23 Procedure visit Marcelino Nurse, MD [facility]  08/12/23 Office Visit Janziel Hockett K, NP [facility]  Showing recent visits within past 90 days and meeting all other requirements Today's Visits Date Type Provider Dept  09/23/23 Office Visit Kadedra Vanaken K, NP [facility]  Showing today's visits and meeting all other requirements Future Appointments Date Type Provider Dept  10/23/23 Appointment Lazariah Savard K, NP [facility]  Showing future appointments within next 90 days and meeting all other requirements  I discussed the assessment and treatment plan with the patient. The patient was provided an opportunity to ask questions and all were answered. The patient agreed with the plan and demonstrated an understanding of the instructions.  Patient advised to call back or seek an in-person evaluation if the symptoms or condition worsens.  Duration of encounter: 30 minutes.  Total time on encounter, as per AMA guidelines included both the face-to-face and non-face-to-face time personally spent by the physician and/or other qualified health care professional(s) on the day of the encounter (includes time in activities that  require the physician or other qualified health care professional and does not include time in activities normally performed by clinical staff). Physician's time may include the following activities when performed: Preparing to see the patient (e.g., pre-charting review of records, searching for previously ordered imaging, lab work, and nerve conduction tests) Review of prior analgesic pharmacotherapies. Reviewing PMP Interpreting ordered tests (e.g., lab work, imaging, nerve conduction tests) Performing post-procedure evaluations, including interpretation of diagnostic procedures Obtaining and/or reviewing separately obtained history Performing a medically appropriate examination and/or evaluation Counseling and educating the patient/family/caregiver Ordering medications, tests, or procedures Referring and communicating with other health care professionals (when not separately reported) Documenting clinical information in the electronic or other health record Independently interpreting results (not separately reported) and communicating results to the patient/ family/caregiver Care coordination (not separately reported)  Note by: Ruthella Kirchman K Hermenegildo Clausen, NP (TTS and AI technology used. I apologize for any typographical errors that were not detected and corrected.) Date: 09/23/2023; Time: 11:13 AM

## 2023-09-29 ENCOUNTER — Other Ambulatory Visit: Payer: Self-pay | Admitting: Nurse Practitioner

## 2023-10-07 NOTE — Progress Notes (Signed)
 PATIENT PROFILE: Claire [name] is a 53 y.o. female who presents to the [facility] GI department for consultation at the request of Dr. Glenard for evaluation of age related colon cancer risk evaluation.  HISTORY OF PRESENT ILLNESS: Claire [name] reports she is well.  States she has had some waxing and waning problems with acid reflux- does take pantoprazole  daily- if she falls asleep after she eats then she will wake with acid reflux- working on not eating late at night- she also endorses solid food dysphagia Hx rouen y and gastric sleeve as below- says the reflux has been better since having the rouen y ( was really bad after the sleeve) but still persists. Note she has been on ozempic  for a couple of years- states she has been unable to increase dose due to upper gi symptoms- they really get very bad on the higher dose.  Also reports +gassiness- feels like sometimes her stool gets stuck in the right abdomen - this improves once she defecates. States she moves her bowels regularly and doesn't have problems with diarrhea/constipation. Denies any rectal bleeding/melena. Denies further GI concerns. No known problems with sedated procedures.  A1C 5.6 and liver panel normal 3/25/ CBC pretty unremarkable 11/24.    Negative hereditary cancer genetic screen 2024  Single contrast barium enema 2020- IMPRESSION:  1. Normally positioned cecum following laparoscopic cecopexy earlier  this year. Normal appendix.  2. Intermittent large bowel diverticulosis, generally mild.  Involvement of the splenic flexure, descending colon, and proximal  sigmoid.  3. Otherwise negative single contrast enema.   Last Colonoscopy- 12/19- Dr Dani- normal looking colon. 5y repeat  2018 colonoscopy by Dr. Shirlyn Blackbird reports normal terminal ileum, normal mucosa entire colon. Few diverticuli reported. Impression states cyclical wall thickening seen pre-CT was likely an artifact. Pathology showed no  abnormalities.   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 Moderate size hiatal hernia with gastroesophageal reflux.  2. Status post sleeve gastrectomy.   June 2018 EGD reported medium size hiatal hernia. Gastritis. Gastric bypass changes. Pathology showed mild chronic gastritis and mild foveolar hyperplasia negative for H. pylori.   Last endoscopy: 2013- Dr Ella- normal esohagus, intact sleeve gastretctomy, normal examined duodenum  Gastric sleeve and fundoplication 2012  EGD 2012- Dr Ella- normal esophagus, normal stomach, normal duodenum  GENERAL REVIEW OF SYSTEMS:   10 systems reviewed, unremarkable other than what is written above with the exception of generalized pain- has fibromyalgia.   MEDICATIONS: Outpatient Encounter Medications as of 10/07/2023  Medication Sig Dispense Refill  . atorvastatin  (LIPITOR) 10 MG tablet Take 10 mg by mouth once daily    . budesonide -formoteroL  (SYMBICORT ) 160-4.5 mcg/actuation inhaler Inhale 2 inhalations into the lungs 2 (two) times daily    . calcium  citrate 250 mg calcium  Tab Take 250 mg by mouth once daily    . cetirizine (ZYRTEC) 10 MG chewable tablet Take 10 mg by mouth once daily    . cyanocobalamin  (VITAMIN B12) 1000 MCG tablet Take 1,000 mcg by mouth every 7 (seven) days    . cyclobenzaprine  (FLEXERIL ) 10 MG tablet Take 10 mg by mouth once daily as needed.    . ergocalciferol , vitamin D2, 1,250 mcg (50,000 unit) capsule Take 50,000 Units by mouth once a week    . fluconazole  (DIFLUCAN )  150 MG tablet Take 150 mg by mouth once daily    . HYDROcodone -acetaminophen  (NORCO) 5-325 mg tablet Take 1 tablet by mouth 2 (two) times daily as needed 14 tablet 0  . ketoconazole  (NIZORAL ) 2 % cream  Apply 1 Application topically    . montelukast  (SINGULAIR ) 10 mg tablet Take 10 mg by mouth at bedtime    . multivitamin tablet Take 1 tablet by mouth once daily    . nystatin  (MYCOSTATIN ) 100,000 unit/gram powder Apply 1 Application topically 3 (three) times daily    . ondansetron  (ZOFRAN ) 4 MG tablet 1 TABLET BY MOUTH AS NEEDED NAUSEA MAY REPEAT AFTER 4 HOURS    . ondansetron  (ZOFRAN -ODT) 4 MG disintegrating tablet Take 1 tablet (4 mg total) by mouth every 8 (eight) hours as needed for Nausea May take TWO at a time IF NEEDED. 20 tablet 0  . OZEMPIC  2 mg/dose (8 mg/3 mL) pen injector INJECT 2 MG AS DIRECTED ONCE A WEEK.    . pantoprazole  (PROTONIX ) 40 MG DR tablet Take 40 mg by mouth once daily    . pregabalin  (LYRICA ) 300 MG capsule Take 1 capsule by mouth 2 (two) times daily    . traZODone  (DESYREL ) 100 MG tablet Take 200 mg by mouth at bedtime    . TRINTELLIX  20 mg tablet Take 20 mg by mouth once daily    . VENTOLIN  HFA 90 mcg/actuation inhaler   0  . zonisamide (ZONEGRAN) 100 MG capsule Take 100 mg by mouth nightly.    . sod picosulf-mag ox-citric ac (CLENPIQ) 10 mg-3.5 gram- 12 gram/175 mL Soln Take 2 Bottles by mouth as directed 350 mL 0  . sodium, potassium, and magnesium (SUPREP) oral solution Take 1 Bottle by mouth as directed One kit contains 2 bottles.  Take both bottles at the times instructed by your provider. 354 mL 0   No facility-administered encounter medications on file as of 10/07/2023.    ALLERGIES: Corn syrup, Duloxetine , Gabapentin, Linaclotide , Other, Adhesive, Aspartame, Duloxetine  hcl, and Tapentadol  PAST MEDICAL HISTORY: Past Medical History:  Diagnosis Date  . Acid reflux   . Asthma without status asthmaticus (HHS-HCC)   . Back pain   . Cholelithiasis without obstruction   . Diabetes mellitus type 2, uncomplicated (CMS/HHS-HCC)   . Sleep apnea     PAST SURGICAL HISTORY: Past Surgical History:  Procedure Laterality Date  . COLPORRHAPHY FOR REPAIR  CYSTOCELE ANTERIOR  2010  . LAPAROSCOPIC CHOLECYSTECTOMY  11/2008  . sleeve gastrectomy N/A 10/25/10  . CHOLECYSTECTOMY       FAMILY HISTORY: Family History  Problem Relation Name Age of Onset  . Diabetes type II Mother    . Hyperlipidemia (Elevated cholesterol) Mother    . High blood pressure (Hypertension) Mother    . Coronary Artery Disease (Blocked arteries around heart) Mother    . Stroke Mother    . Obesity Mother    . Diabetes type II Father    . Hyperlipidemia (Elevated cholesterol) Father    . Lung cancer Maternal Grandfather    . Breast cancer Paternal Grandmother       SOCIAL HISTORY: Social History   Socioeconomic History  . Marital status: Married  Tobacco Use  . Smoking status: Former    Passive exposure: Past  . Smokeless tobacco: Never  Vaping Use  . Vaping status: Never Used  Substance and Sexual Activity  . Alcohol use: Not Currently    Comment: occasionally  . Drug use: Never  . Sexual activity:  Defer   Social Drivers of Health   Financial Resource Strain: Medium Risk (10/07/2023)   Overall Financial Resource Strain (CARDIA)   . Difficulty of Paying Living Expenses: Somewhat hard  Food Insecurity: Food Insecurity Present (10/07/2023)   Hunger Vital Sign   . Worried About Programme researcher, broadcasting/film/video in the Last Year: Sometimes true   . Ran Out of Food in the Last Year: Sometimes true  Transportation Needs: No Transportation Needs (10/07/2023)   PRAPARE - Transportation   . Lack of Transportation (Medical): No   . Lack of Transportation (Non-Medical): No    PHYSICAL EXAM: Vitals:   10/07/23 1117  BP: 136/80  Pulse: 79  Temp: 36.7 C (98 F)   Body mass index is 41.15 kg/m. Weight: 98.8 kg (217 lb 12.8 oz)   General Appearance:    Alert, cooperative, no distress, appears stated age  Head:     Atraumatic, normocephalic  Eyes:   Anciteric  Neck:   Supple, symmetrical, trachea midline, no adenopathy, no thyroid  enlargement; no JVD  Throat:   Lips,  mucosa, and tongue normal; teeth and gums normal  Lungs:     Clear to auscultation bilaterally, respirations unlabored   Heart:    Regular rate and rhythm, S1 and S2 normal, no murmur, rub   or gallop  Abdomen:     Soft, non-tender, bowel sounds active all four quadrants,    no masses, no organomegaly  Extremities:   Extremities normal, atraumatic, no cyanosis or edema  Skin:   Skin color, texture, turgor normal, no rashes or lesions   Neurologic:   Grossly intact    REVIEW OF DATA: I have reviewed the following data today: No visits with results within 3 Month(s) from this visit.  Latest known visit with results is:  Ancillary Orders on 04/26/2023  Component Date Value  . Vit. B1, Whole Blood - L* 04/26/2023 86.8   . Vitamin B6 - [facility] 04/26/2023 3.7   . Sedimentation Rate-Autom* 04/26/2023 14   . Total IgG - [facility] 04/26/2023 968   . Immunoglobulin A, Qn, Se* 04/26/2023 294   . IgM - [facility] 04/26/2023 84   . Protein Total - [facility] 04/26/2023 6.1   . Albumin - [facility] 04/26/2023 3.2   . Alpha-1-Globulin - [facility] 04/26/2023 0.1   . Alpha-2-Globulin - [facility] 04/26/2023 0.8   . Beta Globulin - [facility] 04/26/2023 1.0   . Gamma Globulin - [facility] 04/26/2023 0.9   . M-Spike - [facility] 04/26/2023 Not Observed   . Globulin, Total - [facility] 04/26/2023 2.9   . A/G Ratio - [facility] 04/26/2023 1.2   . IFE 1 - [facility] 04/26/2023 Comment   . Please note: - [facility] 04/26/2023 Comment   . Folate (Folic Acid) 04/26/2023 10.7   . Protein, Total 04/26/2023 6.4   . Albumin 04/26/2023 3.7   . Bilirubin, Total 04/26/2023 0.5   . Bilirubin, Conjugated 04/26/2023 0.14   . Alk Phos (alkaline Phosp* 04/26/2023 93   . AST  04/26/2023 21   . ALT  04/26/2023 32     ASSESSMENT AND PLAN: Ms. Tash is a 53 y.o. female presenting for consultation for the following:   Gerd/dysphagia- reviewed how to take ppi for max benefit. Discussed glp1 agonist side effects and her altered gi anatomy-  diet/lifestyle mods recommended.  Avoid nsaids with rationale given. May have less GI symptoms if alternate to GLP1 found. Also with right sided abdominal pain/bloating/gassiness- HBT/phazyme gas & acid prn per pack insert instructions. Suspect  she has some functional and adhesive abdominal issues that contribute to this.  Schedule egd/colonoscopy with possible dilation and colonoscopy.  Procedure information given: indications, benefits, risks- including, but not limited to bleeding, infection, perforation, difficulty with sedation, were discussed with the patient, and they are agreeable to the procedure  Follow up as needed after procedure, sooner if problems

## 2023-10-09 ENCOUNTER — Encounter: Payer: Self-pay | Admitting: Nurse Practitioner

## 2023-10-09 ENCOUNTER — Ambulatory Visit (HOSPITAL_BASED_OUTPATIENT_CLINIC_OR_DEPARTMENT_OTHER): Payer: PRIVATE HEALTH INSURANCE | Admitting: Student in an Organized Health Care Education/Training Program

## 2023-10-09 ENCOUNTER — Ambulatory Visit
Admission: RE | Admit: 2023-10-09 | Discharge: 2023-10-09 | Disposition: A | Discharge status: Home / Self Care | Payer: PRIVATE HEALTH INSURANCE | Source: Ambulatory Visit | Attending: Student in an Organized Health Care Education/Training Program | Admitting: Student in an Organized Health Care Education/Training Program

## 2023-10-09 ENCOUNTER — Encounter: Payer: Self-pay | Admitting: Student in an Organized Health Care Education/Training Program

## 2023-10-09 VITALS — BP 121/70 | HR 86 | Temp 99.2°F | Resp 18 | Ht 61.0 in | Wt 217.0 lb

## 2023-10-09 DIAGNOSIS — G894 Chronic pain syndrome: Secondary | ICD-10-CM | POA: Insufficient documentation

## 2023-10-09 DIAGNOSIS — M545 Low back pain, unspecified: Secondary | ICD-10-CM | POA: Diagnosis present

## 2023-10-09 DIAGNOSIS — M47816 Spondylosis without myelopathy or radiculopathy, lumbar region: Secondary | ICD-10-CM

## 2023-10-09 MED ORDER — ROPIVACAINE HCL 2 MG/ML IJ SOLN
INTRAMUSCULAR | Status: AC
  Filled 2023-10-09: qty 20

## 2023-10-09 MED ORDER — ROPIVACAINE HCL 2 MG/ML IJ SOLN
18.0000 mL | Freq: Once | INTRAMUSCULAR | Status: AC
  Administered 2023-10-09: 18 mL via PERINEURAL

## 2023-10-09 MED ORDER — LIDOCAINE HCL 2 % IJ SOLN
INTRAMUSCULAR | Status: AC
  Filled 2023-10-09: qty 20

## 2023-10-09 MED ORDER — DEXAMETHASONE SODIUM PHOSPHATE 10 MG/ML IJ SOLN
20.0000 mg | Freq: Once | INTRAMUSCULAR | Status: AC
  Administered 2023-10-09: 20 mg

## 2023-10-09 MED ORDER — LIDOCAINE HCL 2 % IJ SOLN
20.0000 mL | Freq: Once | INTRAMUSCULAR | Status: AC
  Administered 2023-10-09: 400 mg

## 2023-10-09 MED ORDER — DEXAMETHASONE SODIUM PHOSPHATE 10 MG/ML IJ SOLN
INTRAMUSCULAR | Status: AC
  Filled 2023-10-09: qty 1

## 2023-10-09 MED ORDER — MIDAZOLAM HCL 2 MG/2ML IJ SOLN
INTRAMUSCULAR | Status: AC
  Filled 2023-10-09: qty 2

## 2023-10-09 MED ORDER — MIDAZOLAM HCL 2 MG/2ML IJ SOLN
0.5000 mg | Freq: Once | INTRAMUSCULAR | Status: AC
  Administered 2023-10-09: 2 mg via INTRAVENOUS

## 2023-10-09 MED ORDER — LACTATED RINGERS IV SOLN: Freq: Once | INTRAVENOUS | Status: AC

## 2023-10-09 NOTE — Patient Instructions (Signed)

## 2023-10-09 NOTE — Progress Notes (Signed)
 PROVIDER NOTE: Interpretation of information contained herein should be left to medically-trained personnel. Specific patient instructions are provided elsewhere under Patient Instructions section of medical record. This document was created in part using STT-dictation technology, any transcriptional errors that may result from this process are unintentional.  Patient: Claire [name] Type: Established DOB: [birth year] MRN: [id number] PCP: Sowles, Krichna, MD  Service: Procedure DOS: 10/09/2023 Setting: Ambulatory Location: Ambulatory outpatient facility Delivery: Face-to-face Provider: Wallie Sherry, MD Specialty: Interventional Pain Management Specialty designation: 09 Location: Outpatient facility Ref. Prov.: Patel, Seema K, NP       Interventional Therapy   Procedure: Lumbar Facet, Medial Branch Radiofrequency Ablation (RFA) #1  Laterality: Bilateral (-50)  Level: L3, L4, and L5 Medial Branch Level(s). These levels will denervate the L3-4 and L4-5 lumbar facet joints.  Imaging: Fluoroscopy-guided         Anesthesia: Local anesthesia (1-2% Lidocaine ) Sedation: Minimal Sedation                       DOS: 10/09/2023  Performed by: Wallie Sherry, MD  Purpose: Therapeutic/Palliative Indications: Low back pain severe enough to impact quality of life or function. Indications: 1. Lumbosacral pain   2. Chronic pain syndrome    Claire [name] has been dealing with the above chronic pain for longer than three months and has either failed to respond, was unable to tolerate, or simply did not get enough benefit from other more conservative therapies including, but not limited to: 1. Over-the-counter medications 2. Anti-inflammatory medications 3. Muscle relaxants 4. Membrane stabilizers 5. Opioids 6. Physical therapy and/or chiropractic manipulation 7. Modalities (Heat, ice, etc.) 8. Invasive techniques such as nerve blocks. Claire [name] has attained more than 50% relief of the pain  from a series of diagnostic injections conducted in separate occasions.  Pain Score: Pre-procedure: 8 /10 Post-procedure: 0-No pain/10     Position / Prep / Materials:  Position: Prone  Prep solution: ChloraPrep (2% chlorhexidine  gluconate and 70% isopropyl alcohol) Prep Area: Entire Lumbosacral Region (Lower back from mid-thoracic region to end of tailbone and from flank to flank.) Materials:  Tray: RFA (Radiofrequency) tray Needle(s):  Type: RFA (Teflon-coated radiofrequency ablation needles) Gauge (G): 22  Length: Regular (10cm) Qty: 6     H&P (Pre-op Assessment):  Claire [name] is a 53 y.o. (year old), female patient, seen today for interventional treatment. She  has a past surgical history that includes Laparoscopic gastric sleeve resection (2012); Cholecystectomy (2010); Bladder surgery (2010); Plantar fascia surgery (Bilateral); Breast biopsy (Right, 2014); Colonoscopy (2014 ); Upper gi endoscopy; Tonsillectomy; laparoscopy (N/A, 06/16/2015); Robotic assisted salpingo oophorectomy (Right, 06/16/2015); Dilatation & currettage/hysteroscopy with novasure ablation (N/A, 06/16/2015); Lysis of adhesion (N/A, 06/16/2015); Colonoscopy with propofol  (N/A, 01/28/2018); Esophagogastroduodenoscopy (egd) with propofol  (N/A, 01/28/2018); Lithotripsy (12/11/2017); laparoscopy (N/A, 04/01/2018); Gastric Roux-En-Y (N/A, 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 Claire [name] has a current medication list which includes the following prescription(s): aspirin  ec, atorvastatin , budesonide -formoterol , calcium  citrate chewy bite, clobetasol cream, cyanocobalamin , cyclobenzaprine , fluconazole , iron-vitamin c, ketoconazole , ketoconazole , lactobacillus, mometasone , mometasone , montelukast , multivitamin adult, naltrexone , nystatin , ondansetron , pantoprazole , pregabalin , ozempic  (2 mg/dose), trazodone , triamcinolone  cream, trintellix , vitamin d  (ergocalciferol ), and zonisamide. Her primarily concern today  is the Back Pain (lower)  Initial Vital Signs:  Pulse/HCG Rate: 86ECG Heart Rate: 84 Temp: 99.1 F (37.3 C) Resp: 18 BP: 136/89 SpO2: 99 %  BMI: Estimated body mass index is 41 kg/m as calculated from the following:   Height as of this encounter: 5' 1 (1.549 m).  Weight as of this encounter: 217 lb (98.4 kg).  Risk Assessment: Allergies: Reviewed. She is allergic to aspartame and phenylalanine, gabapentin, glucose, linzess  [linaclotide ], other, triamcinolone , cymbalta  [duloxetine  hcl], tape, and tapentadol.  Allergy Precautions: None required Coagulopathies: Reviewed. None identified.  Blood-thinner therapy: None at this time Active Infection(s): Reviewed. None identified. Claire [name] is afebrile  Site Confirmation: Claire [name] was asked to confirm the procedure and laterality before marking the site Procedure checklist: Completed Consent: Before the procedure and under the influence of no sedative(s), amnesic(s), or anxiolytics, the patient was informed of the treatment options, risks and possible complications. To fulfill our ethical and legal obligations, as recommended by the American Medical Association's Code of Ethics, I have informed the patient of my clinical impression; the nature and purpose of the treatment or procedure; the risks, benefits, and possible complications of the intervention; the alternatives, including doing nothing; the risk(s) and benefit(s) of the alternative treatment(s) or procedure(s); and the risk(s) and benefit(s) of doing nothing. The patient was provided information about the general risks and possible complications associated with the procedure. These may include, but are not limited to: failure to achieve desired goals, infection, bleeding, organ or nerve damage, allergic reactions, paralysis, and death. In addition, the patient was informed of those risks and complications associated to Spine-related procedures, such as failure to decrease pain;  infection (i.e.: Meningitis, epidural or intraspinal abscess); bleeding (i.e.: epidural hematoma, subarachnoid hemorrhage, or any other type of intraspinal or peri-dural bleeding); organ or nerve damage (i.e.: Any type of peripheral nerve, nerve root, or spinal cord injury) with subsequent damage to sensory, motor, and/or autonomic systems, resulting in permanent pain, numbness, and/or weakness of one or several areas of the body; allergic reactions; (i.e.: anaphylactic reaction); and/or death. Furthermore, the patient was informed of those risks and complications associated with the medications. These include, but are not limited to: allergic reactions (i.e.: anaphylactic or anaphylactoid reaction(s)); adrenal axis suppression; blood sugar elevation that in diabetics may result in ketoacidosis or comma; water  retention that in patients with history of congestive heart failure may result in shortness of breath, pulmonary edema, and decompensation with resultant heart failure; weight gain; swelling or edema; medication-induced neural toxicity; particulate matter embolism and blood vessel occlusion with resultant organ, and/or nervous system infarction; and/or aseptic necrosis of one or more joints. Finally, the patient was informed that Medicine is not an exact science; therefore, there is also the possibility of unforeseen or unpredictable risks and/or possible complications that may result in a catastrophic outcome. The patient indicated having understood very clearly. We have given the patient no guarantees and we have made no promises. Enough time was given to the patient to ask questions, all of which were answered to the patient's satisfaction. Ms. Matsuura has indicated that she wanted to continue with the procedure. Attestation: I, the ordering provider, attest that I have discussed with the patient the benefits, risks, side-effects, alternatives, likelihood of achieving goals, and potential problems  during recovery for the procedure that I have provided informed consent. Date  Time: 10/09/2023 12:47 PM  Pre-Procedure Preparation:  Monitoring: As per clinic protocol. Respiration, ETCO2, SpO2, BP, heart rate and rhythm monitor placed and checked for adequate function Safety Precautions: Patient was assessed for positional comfort and pressure points before starting the procedure. Time-out: I initiated and conducted the Time-out before starting the procedure, as per protocol. The patient was asked to participate by confirming the accuracy of the Time Out information. Verification of the correct person,  site, and procedure were performed and confirmed by me, the nursing staff, and the patient. Time-out conducted as per Joint Commission's Universal Protocol (UP.01.01.01). Time: 1332 Start Time: 1332 hrs.  Description of Procedure:          Laterality: See above. Levels:  See above. Safety Precautions: Aspiration looking for blood return was conducted prior to all injections. At no point did we inject any substances, as a needle was being advanced. Before injecting, the patient was told to immediately notify me if she was experiencing any new onset of ringing in the ears, or metallic taste in the mouth. No attempts were made at seeking any paresthesias. Safe injection practices and needle disposal techniques used. Medications properly checked for expiration dates. SDV (single dose vial) medications used. After the completion of the procedure, all disposable equipment used was discarded in the proper designated medical waste containers. Local Anesthesia: Protocol guidelines were followed. The patient was positioned over the fluoroscopy table. The area was prepped in the usual manner. The time-out was completed. The target area was identified using fluoroscopy. A 12-in long, straight, sterile hemostat was used with fluoroscopic guidance to locate the targets for each level blocked. Once located,  the skin was marked with an approved surgical skin marker. Once all sites were marked, the skin (epidermis, dermis, and hypodermis), as well as deeper tissues (fat, connective tissue and muscle) were infiltrated with a small amount of a short-acting local anesthetic, loaded on a 10cc syringe with a 25G, 1.5-in  Needle. An appropriate amount of time was allowed for local anesthetics to take effect before proceeding to the next step. Technical description of process:  Radiofrequency Ablation (RFA)  L3 Medial Branch Nerve RFA: The target area for the L3 medial branch is at the junction of the postero-lateral aspect of the superior articular process and the superior, posterior, and medial edge of the transverse process of L4. Under fluoroscopic guidance, a Radiofrequency needle was inserted until contact was made with os over the superior postero-lateral aspect of the pedicular shadow (target area). Sensory and motor testing was conducted to properly adjust the position of the needle. Once satisfactory placement of the needle was achieved, the numbing solution was slowly injected after negative aspiration for blood. 2.0 mL of the nerve block solution was injected without difficulty or complication. After waiting for at least 3 minutes, the ablation was performed. Once completed, the needle was removed intact. L4 Medial Branch Nerve RFA: The target area for the L4 medial branch is at the junction of the postero-lateral aspect of the superior articular process and the superior, posterior, and medial edge of the transverse process of L5. Under fluoroscopic guidance, a Radiofrequency needle was inserted until contact was made with os over the superior postero-lateral aspect of the pedicular shadow (target area). Sensory and motor testing was conducted to properly adjust the position of the needle. Once satisfactory placement of the needle was achieved, the numbing solution was slowly injected after negative aspiration  for blood. 2.0 mL of the nerve block solution was injected without difficulty or complication. After waiting for at least 3 minutes, the ablation was performed. Once completed, the needle was removed intact. L5 Medial Branch Nerve RFA: The target area for the L5 medial branch is at the junction of the postero-lateral aspect of the superior articular process of S1 and the superior, posterior, and medial edge of the sacral ala. Under fluoroscopic guidance, a Radiofrequency needle was inserted until contact was made with os over the  superior postero-lateral aspect of the pedicular shadow (target area). Sensory and motor testing was conducted to properly adjust the position of the needle. Once satisfactory placement of the needle was achieved, the numbing solution was slowly injected after negative aspiration for blood. 2.0 mL of the nerve block solution was injected without difficulty or complication. After waiting for at least 3 minutes, the ablation was performed. Once completed, the needle was removed intact.  Radiofrequency lesioning (ablation):  Radiofrequency Generator: Medtronic AccurianTM AG 1000 RF Generator Sensory Stimulation Parameters: 50 Hz was used to locate & identify the nerve, making sure that the needle was positioned such that there was no sensory stimulation below 0.3 V or above 0.7 V. Motor Stimulation Parameters: 2 Hz was used to evaluate the motor component. Care was taken not to lesion any nerves that demonstrated motor stimulation of the lower extremities at an output of less than 2.5 times that of the sensory threshold, or a maximum of 2.0 V. Lesioning Technique Parameters: Standard Radiofrequency settings. (Not bipolar or pulsed.) Temperature Settings: 80 degrees C Lesioning time: 60 seconds Stationary intra-operative compliance: Compliant  Once the entire procedure was completed, the treated area was cleaned, making sure to leave some of the prepping solution back to take  advantage of its long term bactericidal properties.    Illustration of the posterior view of the lumbar spine and the posterior neural structures. Laminae of L2 through S1 are labeled. DPRL5, dorsal primary ramus of L5; DPRS1, dorsal primary ramus of S1; DPR3, dorsal primary ramus of L3; FJ, facet (zygapophyseal) joint L3-L4; I, inferior articular process of L4; LB1, lateral branch of dorsal primary ramus of L1; IAB, inferior articular branches from L3 medial branch (supplies L4-L5 facet joint); IBP, intermediate branch plexus; MB3, medial branch of dorsal primary ramus of L3; NR3, third lumbar nerve root; S, superior articular process of L5; SAB, superior articular branches from L4 (supplies L4-5 facet joint also); TP3, transverse process of L3.  Facet Joint Innervation (* possible contribution)  L1-2 T12, L1 (L2*)  Medial Branch  L2-3 L1, L2 (L3*)                     L3-4 L2, L3 (L4*)                     L4-5 L3, L4 (L5*)                     L5-S1 L4, L5, S1                        Vitals:   10/09/23 1347 10/09/23 1352 10/09/23 1357 10/09/23 1402  BP: 121/84 124/83 131/80 121/70  Pulse:      Resp: 16 17 16 18   Temp:    99.2 F (37.3 C)  TempSrc:      SpO2: 96% 99% 99% 98%  Weight:      Height:        Start Time: 1332 hrs. End Time: 1354 hrs.  Imaging Guidance (Spinal):         Type of Imaging Technique: Fluoroscopy Guidance (Spinal) Indication(s): Fluoroscopy guidance for needle placement to enhance accuracy in procedures requiring precise needle localization for targeted delivery of medication in or near specific anatomical locations not easily accessible without such real-time imaging assistance. Exposure Time: Please see nurses notes. Contrast: None used. Fluoroscopic Guidance: I was personally present during the use of fluoroscopy. Tunnel Vision Technique  used to obtain the best possible view of the target area. Parallax error corrected before commencing the procedure.  Direction-depth-direction technique used to introduce the needle under continuous pulsed fluoroscopy. Once target was reached, antero-posterior, oblique, and lateral fluoroscopic projection used confirm needle placement in all planes. Images permanently stored in EMR. Interpretation: No contrast injected. I personally interpreted the imaging intraoperatively. Adequate needle placement confirmed in multiple planes. Permanent images saved into the patient's record.  Antibiotic Prophylaxis:   Anti-infectives (From admission, onward)    None      Indication(s): None identified  Post-operative Assessment:  Post-procedure Vital Signs:  Pulse/HCG Rate: 8672 Temp: 99.2 F (37.3 C) Resp: 18 BP: 121/70 SpO2: 98 %  EBL: None  Complications: No immediate post-treatment complications observed by team, or reported by patient.  Note: The patient tolerated the entire procedure well. A repeat set of vitals were taken after the procedure and the patient was kept under observation following institutional policy, for this type of procedure. Post-procedural neurological assessment was performed, showing return to baseline, prior to discharge. The patient was provided with post-procedure discharge instructions, including a section on how to identify potential problems. Should any problems arise concerning this procedure, the patient was given instructions to immediately contact us , at any time, without hesitation. In any case, we plan to contact the patient by telephone for a follow-up status report regarding this interventional procedure.  Comments:  No additional relevant information.  Plan of Care (POC)  Orders:  Orders Placed This Encounter  Procedures   DG PAIN [facility] C-ARM 1-60 MIN NO REPORT    Intraoperative interpretation by procedural physician at [facility].    Standing Status:   Standing    Number of Occurrences:   1    Reason for exam::   Assistance in needle guidance and  placement for procedures requiring needle placement in or near specific anatomical locations not easily accessible without such assistance.     Medications ordered for procedure: Meds ordered this encounter  Medications   lidocaine  (XYLOCAINE ) 2 % (with pres) injection 400 mg   lactated ringers  infusion   midazolam  (VERSED ) injection 0.5-2 mg    Make sure Flumazenil is available in the pyxis when using this medication. If oversedation occurs, administer 0.2 mg IV over 15 sec. If after 45 sec no response, administer 0.2 mg again over 1 min; may repeat at 1 min intervals; not to exceed 4 doses (1 mg)   ropivacaine  (PF) 2 mg/mL (0.2%) (NAROPIN ) injection 18 mL   dexamethasone  (DECADRON ) injection 20 mg   Medications administered: We administered lidocaine , lactated ringers , midazolam , ropivacaine  (PF) 2 mg/mL (0.2%), and dexamethasone .  See the medical record for exact dosing, route, and time of administration.    TPI- cervical, thoracic 08/19/23; bilateral L3, L4, L5 RFA 10/09/2023    Follow-up plan:   Return for keep same appt .     Recent Visits Date Type Provider Dept  09/23/23 Office Visit Patel, Seema K, NP [facility]  09/02/23 Office Visit Patel, Seema K, NP [facility]  08/19/23 Procedure visit Marcelino Nurse, MD [facility]  08/12/23 Office Visit Patel, Seema K, NP [facility]  Showing recent visits within past 90 days and meeting all other requirements Today's Visits Date Type Provider Dept  10/09/23 Procedure visit Marcelino Nurse, MD [facility]  Showing today's visits and meeting all other requirements Future Appointments Date Type Provider Dept  10/23/23 Appointment Patel, Seema K, NP [facility]  Showing future appointments within next 90 days and meeting all other requirements   Disposition: Discharge home  Discharge (Date  Time): 10/09/2023; 1415 hrs.   Primary Care Physician: Sowles, Krichna,  MD Location: [facility] Note by: Wallie Sherry, MD (TTS technology used. I apologize for any typographical errors that were not detected and corrected.) Date: 10/09/2023; Time: 3:09 PM  Disclaimer:  Medicine is not an Visual merchandiser. The only guarantee in medicine is that nothing is guaranteed. It is important to note that the decision to proceed with this intervention was based on the information collected from the patient. The Data and conclusions were drawn from the patient's questionnaire, the interview, and the physical examination. Because the information was provided in large part by the patient, it cannot be guaranteed that it has not been purposely or unconsciously manipulated. Every effort has been made to obtain as much relevant data as possible for this evaluation. It is important to note that the conclusions that lead to this procedure are derived in large part from the available data. Always take into account that the treatment will also be dependent on availability of resources and existing treatment guidelines, considered by other Pain Management Practitioners as being common knowledge and practice, at the time of the intervention. For Medico-Legal purposes, it is also important to point out that variation in procedural techniques and pharmacological choices are the acceptable norm. The indications, contraindications, technique, and results of the above procedure should only be interpreted and judged by a Board-Certified Interventional Pain Specialist with extensive familiarity and expertise in the same exact procedure and technique.

## 2023-10-10 ENCOUNTER — Telehealth: Payer: Self-pay | Admitting: *Deleted

## 2023-10-10 NOTE — Telephone Encounter (Signed)
 Called for post procedure follow-up. Message left.

## 2023-10-12 ENCOUNTER — Other Ambulatory Visit: Payer: Self-pay | Admitting: Family Medicine

## 2023-10-12 DIAGNOSIS — E538 Deficiency of other specified B group vitamins: Secondary | ICD-10-CM

## 2023-10-22 ENCOUNTER — Encounter: Payer: Self-pay | Admitting: Family Medicine

## 2023-10-22 ENCOUNTER — Other Ambulatory Visit: Payer: Self-pay | Admitting: Family Medicine

## 2023-10-22 DIAGNOSIS — B372 Candidiasis of skin and nail: Secondary | ICD-10-CM

## 2023-10-22 MED ORDER — FLUCONAZOLE 150 MG PO TABS: 150.0000 mg | ORAL_TABLET | ORAL | 0 refills | Status: DC

## 2023-10-23 ENCOUNTER — Ambulatory Visit
Admission: RE | Admit: 2023-10-23 | Discharge: 2023-10-23 | Disposition: A | Discharge status: Home / Self Care | Payer: PRIVATE HEALTH INSURANCE | Attending: Nurse Practitioner | Admitting: Nurse Practitioner

## 2023-10-23 ENCOUNTER — Other Ambulatory Visit: Payer: Self-pay | Admitting: Nurse Practitioner

## 2023-10-23 ENCOUNTER — Ambulatory Visit (HOSPITAL_BASED_OUTPATIENT_CLINIC_OR_DEPARTMENT_OTHER): Payer: PRIVATE HEALTH INSURANCE | Admitting: Nurse Practitioner

## 2023-10-23 ENCOUNTER — Ambulatory Visit
Admission: RE | Admit: 2023-10-23 | Discharge: 2023-10-23 | Disposition: A | Discharge status: Home / Self Care | Payer: PRIVATE HEALTH INSURANCE | Source: Ambulatory Visit | Attending: Nurse Practitioner | Admitting: Nurse Practitioner

## 2023-10-23 VITALS — BP 133/70 | HR 66 | Temp 98.0°F | Resp 16 | Ht 61.0 in | Wt 217.0 lb

## 2023-10-23 DIAGNOSIS — G8929 Other chronic pain: Secondary | ICD-10-CM | POA: Insufficient documentation

## 2023-10-23 DIAGNOSIS — M797 Fibromyalgia: Secondary | ICD-10-CM

## 2023-10-23 DIAGNOSIS — G894 Chronic pain syndrome: Secondary | ICD-10-CM

## 2023-10-23 DIAGNOSIS — M25551 Pain in right hip: Secondary | ICD-10-CM

## 2023-10-23 DIAGNOSIS — M4316 Spondylolisthesis, lumbar region: Secondary | ICD-10-CM | POA: Insufficient documentation

## 2023-10-23 DIAGNOSIS — M25552 Pain in left hip: Secondary | ICD-10-CM

## 2023-10-23 DIAGNOSIS — M7918 Myalgia, other site: Secondary | ICD-10-CM

## 2023-10-23 DIAGNOSIS — M545 Low back pain, unspecified: Secondary | ICD-10-CM | POA: Insufficient documentation

## 2023-10-23 DIAGNOSIS — M47816 Spondylosis without myelopathy or radiculopathy, lumbar region: Secondary | ICD-10-CM | POA: Insufficient documentation

## 2023-10-23 MED ORDER — NALTREXONE HCL 50 MG PO TABS: 50.0000 mg | ORAL_TABLET | Freq: Every day | ORAL | 2 refills | Status: DC

## 2023-10-23 NOTE — Progress Notes (Signed)
 PROVIDER NOTE: Interpretation of information contained herein should be left to medically-trained personnel. Specific patient instructions are provided elsewhere under Patient Instructions section of medical record. This document was created in part using AI and STT-dictation technology, any transcriptional errors that may result from this process are unintentional.  Patient: Claire [name]  Service: E/M   PCP: Sowles, Krichna, MD  DOB: 01-10-1971  DOS: 10/23/2023  Provider: Emmy MARLA Blanch, NP  MRN: [id number]  Delivery: Face-to-face  Specialty: Interventional Pain Management  Type: Established Patient  Setting: Ambulatory outpatient facility  Specialty designation: 09  Referring Prov.: Sowles, Krichna, MD  Location: Outpatient office facility       History of present illness (HPI) Claire [name], a 53 y.o. year old female, is here today because of her Chronic hip pain, bilateral [M25.551, M25.552, G89.29]. Claire [name] primary complain today is Back Pain (bilateral) and bilateral hip pain (R>L).   Pertinent problems: Claire [name]  has has Right shoulder pain, Bilateral low back pain, Generalized body pain; Fibromyalgia; Chronic pain syndrome; Carpal tunnel syndrome (Right); Myofascial pain syndrome; Spondylosis of lumbar region without myelopathy or radiculopathy; and Back pain (Bilateral) (R>L) on their pertinent problem list.   Pain Assessment: Severity of Chronic pain is reported as a 8 /10. Location: Back Lower, Right/buttock and hips bilateral. Onset: More than a month ago. Quality: Aching, Constant, Sharp, Throbbing. Timing: Constant. Modifying factor(s): heating pad, ibuprofen, rest, zero gravity bed. Vitals:  height is 5' 1 (1.549 m) and weight is 217 lb (98.4 kg). Her temperature is 98 F (36.7 C). Her blood pressure is 133/70 and her pulse is 66. Her respiration is 16 and oxygen saturation is 99%.  BMI: Estimated body mass index is 41 kg/m as calculated from the following:    Height as of this encounter: 5' 1 (1.549 m).   Weight as of this encounter: 217 lb (98.4 kg).  Last encounter: 09/23/2023. Last procedure: 10/09/2023  Reason for encounter: evaluation for possible interventional PM therapy/treatment, medication management, and post procedure evaluation. No change in medical history since last visit.  Patient's pain is at baseline.  Patient continues multimodal pain regimen as prescribed.  States that it provides pain relief and improvement in functional status.   Claire [name] underwent a lumbar facet, medial branch radiofrequency ablation (RFA) on October 09, 2023.  She reports that initially 80% pain relief and functional improvement during the local anesthetic phase; however she reports that it may take for up to 3 to 4 weeks for full recovery and significant pain relief.  The patient understand that it recovery from procedures takes time and reports that she is still in the healing phase.   The patient complains of chronic bilateral hip pain, more severe on right side.  She reports that her left hip pain started after lumbar RFA.  We discussed obtaining bilateral hip x-rays and proceeding with a hip right hip injection.  Procedure Procedure: Lumbar Facet, Medial Branch Radiofrequency Ablation (RFA) #1  Laterality: Bilateral (-50)  Level: L3, L4, and L5 Medial Branch Level(s). These levels will denervate the L3-4 and L4-5 lumbar facet joints.  Imaging: Fluoroscopy-guided         Anesthesia: Local anesthesia (1-2% Lidocaine ) Sedation: Minimal Sedation                       DOS: 10/09/2023  Performed by: Wallie Sherry, MD   Purpose: Therapeutic/Palliative Indications: Low back pain severe enough to impact quality of life or  function. Indications: 1. Lumbosacral pain   2. Chronic pain syndrome     Ms. Siegert has been dealing with the above chronic pain for longer than three months and has either failed to respond, was unable to tolerate, or simply did not  get enough benefit from other more conservative therapies including, but not limited to: 1. Over-the-counter medications 2. Anti-inflammatory medications 3. Muscle relaxants 4. Membrane stabilizers 5. Opioids 6. Physical therapy and/or chiropractic manipulation 7. Modalities (Heat, ice, etc.) 8. Invasive techniques such as nerve blocks. Ms. Busk has attained more than 50% relief of the pain from a series of diagnostic injections conducted in separate occasions.   Pain Score: Pre-procedure: 8 /10 Post-procedure: 0-No pain/10   Post-Procedure Evaluation    Effectiveness:  Initial hour after procedure: 100 % . Subsequent 4-6 hours post-procedure: 80 % . Analgesia past initial 6 hours: 0 % (Procedure 2 weeks and still healing) . Ongoing improvement:  Analgesic:  Ms. Pense underwent a lumbar facet, medial branch radiofrequency ablation (RFA) on October 09, 2023.  She reports that initially 80% pain relief and functional improvement during the local anesthetic phase; however she reports that it may take for up to 3 to 4 weeks for full recovery and significant pain relief.  Function: Ms. Cloyd reports improvement in function ROM: Ms. Younts reports improvement in ROM  Pharmacotherapy Assessment   Analgesic: Naltrexone  (Depade) 50 mg total by mouth daily. Monitoring: [place] PMP: PDMP reviewed during this encounter.       Pharmacotherapy: No side-effects or adverse reactions reported. Compliance: No problems identified. Effectiveness: Clinically acceptable.  Dorlene Montie FALCON, RN  10/23/2023  8:47 AM  Sign when Signing Visit Safety precautions to be maintained throughout the outpatient stay will include: orient to surroundings, keep bed in low position, maintain call bell within reach at all times, provide assistance with transfer out of bed and ambulation.     UDS:  No results found for: SUMMARY  No results found for: CBDTHCR No results found for: D8THCCBX No  results found for: D9THCCBX  ROS  Constitutional: Denies any fever or chills Gastrointestinal: No reported hemesis, hematochezia, vomiting, or acute GI distress Musculoskeletal: Back Pain (bilateral) and bilateral hip pain (R>L).  Neurological: No reported episodes of acute onset apraxia, aphasia, dysarthria, agnosia, amnesia, paralysis, loss of coordination, or loss of consciousness  Medication Review  Calcium  Citrate-Vitamin D , Iron-Vitamin C, Lactobacillus, Multivitamin Adult, Semaglutide  (2 MG/DOSE), Vitamin D  (Ergocalciferol ), aspirin  EC, atorvastatin , budesonide -formoterol , clobetasol cream, cyanocobalamin , cyclobenzaprine , fluconazole , ketoconazole , mometasone , montelukast , naltrexone , nystatin , ondansetron , pantoprazole , pregabalin , traZODone , triamcinolone  cream, vortioxetine  HBr, and zonisamide  History Review  Allergy: Ms. Werntz is allergic to aspartame and phenylalanine, gabapentin, glucose, linzess  [linaclotide ], other, triamcinolone , cymbalta  [duloxetine  hcl], tape, and tapentadol. Drug: Ms. Mullan  reports no history of drug use. Alcohol:  reports current alcohol use. Tobacco:  reports that she quit smoking about 30 years ago. Her smoking use included cigarettes. She started smoking about 40 years ago. She has a 10 pack-year smoking history. She has never used smokeless tobacco. Social: Ms. Lauf  reports that she quit smoking about 30 years ago. Her smoking use included cigarettes. She started smoking about 40 years ago. She has a 10 pack-year smoking history. She has never used smokeless tobacco. She reports current alcohol use. She reports that she does not use drugs. Medical:  has a past medical history of Anemia, Anxiety, Arthritis, Asthma, Bilateral leg cramps, Depression, Diabetes mellitus without complication (HCC), DJD (degenerative joint disease), Dyspnea on exertion, GERD (gastroesophageal  reflux disease), Headache, IBS (irritable bowel syndrome), Kidney stone,  Low serum vitamin B12, Lung nodules, Migraine, Muscle spasm, Nonobstructive CAD (coronary artery disease), Psoriasis, Seasonal allergies, Sleep apnea, Vasovagal syncope, and Vitamin D  deficiency. Surgical: Ms. Kjos  has a past surgical history that includes Laparoscopic gastric sleeve resection (2012); Cholecystectomy (2010); Bladder surgery (2010); Plantar fascia surgery (Bilateral); Breast biopsy (Right, 2014); Colonoscopy (2014 ); Upper gi endoscopy; Tonsillectomy; laparoscopy (N/A, 06/16/2015); Robotic assisted salpingo oophorectomy (Right, 06/16/2015); Dilatation & currettage/hysteroscopy with novasure ablation (N/A, 06/16/2015); Lysis of adhesion (N/A, 06/16/2015); Colonoscopy with propofol  (N/A, 01/28/2018); Esophagogastroduodenoscopy (egd) with propofol  (N/A, 01/28/2018); Lithotripsy (12/11/2017); laparoscopy (N/A, 04/01/2018); Gastric Roux-En-Y (N/A, 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 Family: family history includes Breast cancer in her paternal aunt and paternal grandmother; Heart attack in her maternal uncle, mother, paternal grandfather, and sister; Hyperlipidemia in her father; Lung cancer in her father and maternal grandfather; Multiple myeloma in her mother; Stroke in her mother.  Laboratory Chemistry Profile   Renal Lab Results  Component Value Date   BUN 14 01/11/2023   CREATININE 0.68 01/11/2023   LABCREA 153 01/11/2023   BCR SEE NOTE: 01/11/2023   GFRAA >60 11/04/2019   GFRNONAA >60 11/04/2019    Hepatic Lab Results  Component Value Date   AST 62 (H) 01/11/2023   ALT 77 (H) 01/11/2023   ALBUMIN 4.1 11/03/2019   ALKPHOS 67 11/03/2019   LIPASE 30 04/02/2019    Electrolytes Lab Results  Component Value Date   NA 135 01/11/2023   K 4.0 01/11/2023   CL 101 01/11/2023   CALCIUM  9.7 01/11/2023   MG 1.8 11/04/2019    Bone Lab Results  Component Value Date   VD25OH 31 01/11/2023    Inflammation (CRP: Acute Phase) (ESR: Chronic Phase) No results  found for: CRP, ESRSEDRATE, LATICACIDVEN       Note: Above Lab results reviewed.  Recent Imaging Review  DG PAIN CLINIC C-ARM 1-60 MIN NO REPORT Fluoro was used, but no Radiologist interpretation will be provided.  Please refer to NOTES tab for provider progress note. Note: Reviewed        Physical Exam  Vitals: BP 133/70   Pulse 66   Temp 98 F (36.7 C)   Resp 16   Ht 5' 1 (1.549 m)   Wt 217 lb (98.4 kg)   SpO2 99%   BMI 41.00 kg/m  BMI: Estimated body mass index is 41 kg/m as calculated from the following:   Height as of this encounter: 5' 1 (1.549 m).   Weight as of this encounter: 217 lb (98.4 kg). Ideal: Ideal body weight: 47.8 kg (105 lb 6.1 oz) Adjusted ideal body weight: 68.1 kg (150 lb 0.5 oz) General appearance: Well nourished, well developed, and well hydrated. In no apparent acute distress Mental status: Alert, oriented x 3 (person, place, & time)       Respiratory: No evidence of acute respiratory distress Eyes: PERLA  Lumbar Exam  Skin & Axial Inspection: No masses, redness, or swelling Alignment: Symmetrical Functional ROM: Pain restricted ROM       Stability: No instability detected Muscle Tone/Strength: Functionally intact. No obvious neuro-muscular anomalies detected. Sensory (Neurological): Musculoskeletal pain pattern Palpation: No palpable anomalies       Provocative Tests: Hyperextension/rotation test: deferred today       Lumbar quadrant test (Kemp's test): deferred today       Lateral bending test: deferred today       Patrick's Maneuver: (+) for bilateral  hip arthralgia  FABER* test: (+) for bilateral hip arthralgia  *(Flexion, ABduction and External Rotation)  Assessment   Diagnosis Status  1. Chronic hip pain, bilateral   2. Lumbosacral pain   3. Chronic pain syndrome   4. Spondylosis of lumbar region without myelopathy or radiculopathy   5. Spondylolisthesis of lumbar region   6. Fibromyalgia   7. Myofascial pain syndrome     Persistent Controlled Controlled   Updated Problems: Problem  Chronic hip pain, bilateral (R>L)    Plan of Care  Problem-specific:  Assessment and Plan We will continue on current medication regimen.  Prescribing drug monitoring (PDMP) reviewed.  Schedule follow-up in 90 days for medication management with Emmy Blanch, NP.  Plan: Bilateral hip x-ray Schedule (Clinic): (R) hip injection # 1 with Dr. Marcelino    Claire [name] has a current medication list which includes the following long-term medication(s): atorvastatin , budesonide -formoterol , calcium  citrate chewy bite, mometasone , montelukast , pregabalin , trazodone , and zonisamide.  Pharmacotherapy (Medications Ordered): Meds ordered this encounter  Medications   naltrexone  (DEPADE) 50 MG tablet    Sig: Take 1 tablet (50 mg total) by mouth daily.    Dispense:  30 tablet    Refill:  2   Orders:  Orders Placed This Encounter  Procedures   HIP INJECTION    Standing Status:   Future    Expiration Date:   01/22/2024    Scheduling Instructions:     Procedure: Intra-articular Hip joint injection     Side: Right-sided     Approach: Posterior     Sedation: Patient's choice.     Timeframe: As soon as schedule allows   DG HIPS BILAT W OR W/O PELVIS MIN 5 VIEWS    Standing Status:   Future    Number of Occurrences:   1    Expiration Date:   01/22/2024    Scheduling Instructions:     Please make sure that the patient understands that this needs to be done as soon as possible. Never have the patient do the imaging just before the next appointment. Inform patient that having the imaging done within the [facility] will expedite the availability of the results and will provide      imaging availability to the requesting physician. In addition inform the patient that the imaging order has an expiration date and will not be renewed if not done within the active period.    Reason for Exam (SYMPTOM  OR DIAGNOSIS REQUIRED):    Chronic bilateral hip pain (M25.551, M25.552, G89.29)    Is patient pregnant?:   No    Preferred imaging location?:   [facility]    Release to patient:   Immediate    Call Results- Best Contact Number?:   [phone] [facility]        Return in about 3 months (around 01/22/2024) for (F2F), (MM), Emmy Blanch NP.    Recent Visits Date Type Provider Dept  10/09/23 Procedure visit Marcelino Nurse, MD [facility]  09/23/23 Office Visit Zophia Marrone K, NP [facility]  09/02/23 Office Visit Alyha Marines K, NP [facility]  08/19/23 Procedure visit Marcelino Nurse, MD [facility]  08/12/23 Office Visit Golda Zavalza K, NP [facility]  Showing recent visits within past 90 days and meeting all other requirements Today's Visits Date Type Provider Dept  10/23/23 Office Visit Karsyn Rochin K, NP [facility]  Showing today's  visits and meeting all other requirements Future Appointments Date Type Provider Dept  11/11/23 Appointment Marcelino Nurse, MD [facility]  01/20/24 Appointment Ifeoluwa Bartz K, NP [facility]  Showing future appointments within next 90 days and meeting all other requirements  I discussed the assessment and treatment plan with the patient. The patient was provided an opportunity to ask questions and all were answered. The patient agreed with the plan and demonstrated an understanding of the instructions.  Patient advised to call back or seek an in-person evaluation if the symptoms or condition worsens.  Duration of encounter: 30 minutes.  Total time on encounter, as per AMA guidelines included both the face-to-face and non-face-to-face time personally spent by the physician and/or other qualified health care professional(s) on the day of the encounter (includes time in activities that require the physician or other qualified health care  professional and does not include time in activities normally performed by clinical staff). Physician's time may include the following activities when performed: Preparing to see the patient (e.g., pre-charting review of records, searching for previously ordered imaging, lab work, and nerve conduction tests) Review of prior analgesic pharmacotherapies. Reviewing PMP Interpreting ordered tests (e.g., lab work, imaging, nerve conduction tests) Performing post-procedure evaluations, including interpretation of diagnostic procedures Obtaining and/or reviewing separately obtained history Performing a medically appropriate examination and/or evaluation Counseling and educating the patient/family/caregiver Ordering medications, tests, or procedures Referring and communicating with other health care professionals (when not separately reported) Documenting clinical information in the electronic or other health record Independently interpreting results (not separately reported) and communicating results to the patient/ family/caregiver Care coordination (not separately reported)  Note by: Dametria Tuzzolino K Jaynia Fendley, NP (TTS and AI technology used. I apologize for any typographical errors that were not detected and corrected.) Date: 10/23/2023; Time: 10:45 AM

## 2023-10-23 NOTE — Progress Notes (Signed)
 Safety precautions to be maintained throughout the outpatient stay will include: orient to surroundings, keep bed in low position, maintain call bell within reach at all times, provide assistance with transfer out of bed and ambulation.

## 2023-10-24 ENCOUNTER — Encounter
Admission: RE | Disposition: A | Discharge status: Home / Self Care | Payer: Self-pay | Source: Home / Self Care | Attending: Gastroenterology

## 2023-10-24 ENCOUNTER — Encounter: Payer: Self-pay | Admitting: Gastroenterology

## 2023-10-24 ENCOUNTER — Ambulatory Visit: Payer: PRIVATE HEALTH INSURANCE

## 2023-10-24 ENCOUNTER — Ambulatory Visit
Admission: RE | Admit: 2023-10-24 | Discharge: 2023-10-24 | Disposition: A | Discharge status: Home / Self Care | Payer: PRIVATE HEALTH INSURANCE | Attending: Gastroenterology | Admitting: Gastroenterology

## 2023-10-24 ENCOUNTER — Other Ambulatory Visit: Payer: Self-pay

## 2023-10-24 ENCOUNTER — Other Ambulatory Visit: Payer: Self-pay | Admitting: Gastroenterology

## 2023-10-24 DIAGNOSIS — Z7985 Long-term (current) use of injectable non-insulin antidiabetic drugs: Secondary | ICD-10-CM | POA: Insufficient documentation

## 2023-10-24 DIAGNOSIS — Z87891 Personal history of nicotine dependence: Secondary | ICD-10-CM | POA: Insufficient documentation

## 2023-10-24 DIAGNOSIS — G4730 Sleep apnea, unspecified: Secondary | ICD-10-CM | POA: Insufficient documentation

## 2023-10-24 DIAGNOSIS — I2510 Atherosclerotic heart disease of native coronary artery without angina pectoris: Secondary | ICD-10-CM | POA: Insufficient documentation

## 2023-10-24 DIAGNOSIS — K2100 Gastro-esophageal reflux disease with esophagitis, without bleeding: Secondary | ICD-10-CM | POA: Diagnosis not present

## 2023-10-24 DIAGNOSIS — E119 Type 2 diabetes mellitus without complications: Secondary | ICD-10-CM | POA: Diagnosis not present

## 2023-10-24 DIAGNOSIS — J45909 Unspecified asthma, uncomplicated: Secondary | ICD-10-CM | POA: Diagnosis not present

## 2023-10-24 DIAGNOSIS — R1310 Dysphagia, unspecified: Secondary | ICD-10-CM | POA: Diagnosis present

## 2023-10-24 DIAGNOSIS — Z6841 Body Mass Index (BMI) 40.0 and over, adult: Secondary | ICD-10-CM | POA: Insufficient documentation

## 2023-10-24 DIAGNOSIS — Z980 Intestinal bypass and anastomosis status: Secondary | ICD-10-CM | POA: Insufficient documentation

## 2023-10-24 DIAGNOSIS — E66813 Obesity, class 3: Secondary | ICD-10-CM | POA: Insufficient documentation

## 2023-10-24 DIAGNOSIS — Z9884 Bariatric surgery status: Secondary | ICD-10-CM | POA: Diagnosis not present

## 2023-10-24 HISTORY — PX: ESOPHAGOGASTRODUODENOSCOPY: SHX5428

## 2023-10-24 SURGERY — EGD (ESOPHAGOGASTRODUODENOSCOPY)
Anesthesia: General

## 2023-10-24 MED ORDER — LIDOCAINE HCL (CARDIAC) PF 100 MG/5ML IV SOSY
PREFILLED_SYRINGE | INTRAVENOUS | Status: DC | PRN
  Administered 2023-10-24: 100 mg via INTRAVENOUS

## 2023-10-24 MED ORDER — PROPOFOL 10 MG/ML IV BOLUS
INTRAVENOUS | Status: DC | PRN
  Administered 2023-10-24: 100 mg via INTRAVENOUS
  Administered 2023-10-24 (×3): 20 mg via INTRAVENOUS

## 2023-10-24 MED ORDER — SODIUM CHLORIDE 0.9 % IV SOLN: INTRAVENOUS | Status: DC

## 2023-10-24 NOTE — Anesthesia Postprocedure Evaluation (Signed)
 Anesthesia Post Note  Patient: Claire [name]  Procedure(s) Performed: EGD (ESOPHAGOGASTRODUODENOSCOPY)  Patient location during evaluation: Endoscopy Anesthesia Type: General Level of consciousness: awake and alert Pain management: pain level controlled Vital Signs Assessment: post-procedure vital signs reviewed and stable Respiratory status: spontaneous breathing, nonlabored ventilation and respiratory function stable Cardiovascular status: blood pressure returned to baseline and stable Postop Assessment: no apparent nausea or vomiting Anesthetic complications: no   No notable events documented.   Last Vitals:  Vitals:   10/24/23 0819 10/24/23 0829  BP: 123/77 122/72  Pulse: 62 (!) 56  Resp: 18 18  Temp:    SpO2: 96% 97%    Last Pain:  Vitals:   10/24/23 0829  TempSrc:   PainSc: 0-No pain                 Camellia Merilee Louder

## 2023-10-24 NOTE — Transfer of Care (Signed)
 Immediate Anesthesia Transfer of Care Note  Patient: Claire [name]  Procedure(s) Performed: EGD (ESOPHAGOGASTRODUODENOSCOPY)  Patient Location: Endoscopy Unit  Anesthesia Type:General  Level of Consciousness: awake, alert , and oriented  Airway & Oxygen Therapy: Patient Spontanous Breathing  Post-op Assessment: Report given to RN and Post -op Vital signs reviewed and stable  Post vital signs: Reviewed and stable  Last Vitals:  Vitals Value Taken Time  BP 109/58 10/24/23 08:08  Temp 35.9 C 10/24/23 08:08  Pulse 63 10/24/23 08:08  Resp 18 10/24/23 08:08  SpO2 98 % 10/24/23 08:08    Last Pain:  Vitals:   10/24/23 0808  TempSrc: Tympanic  PainSc: 0-No pain         Complications: No notable events documented.

## 2023-10-24 NOTE — Anesthesia Preprocedure Evaluation (Addendum)
 Anesthesia Evaluation  Patient identified by MRN, date of birth, ID band Patient awake    Reviewed: Allergy & Precautions, H&P , NPO status , Patient's Chart, lab work & pertinent test results  Airway Mallampati: II  TM Distance: >3 FB Neck ROM: full    Dental no notable dental hx.    Pulmonary asthma , sleep apnea , former smoker   Pulmonary exam normal        Cardiovascular + CAD (non-obstructive CAD 2016)  Normal cardiovascular exam  07/2019 Echo: EF 60-65%, no rwma, nl RV fxn, triv MR.   06/2014 ETT: Ex time 8:59, max HR 155, 10.1 METS, no ST/T changes; b. 08/2019 Cor CTA: Ca2+ 175 (99th %'ile), LAD 35-49p, LCX 0-48m-->Med rx.   Neuro/Psych  PSYCHIATRIC DISORDERS      Chronic pain syndrome    GI/Hepatic Neg liver ROS,GERD  ,,s/p gastric sleeve 2012   Endo/Other  diabetes  Class 3 obesity  Renal/GU negative Renal ROS  negative genitourinary   Musculoskeletal   Abdominal  (+) + obese  Peds  Hematology negative hematology ROS (+)   Anesthesia Other Findings Past Medical History: No date: Anemia No date: Anxiety No date: Arthritis     Comment:  hands, hips No date: Asthma No date: Bilateral leg cramps No date: Depression No date: Diabetes mellitus without complication (HCC)     Comment:  history no longer a problem since weight loss surgery No date: DJD (degenerative joint disease) No date: Dyspnea on exertion     Comment:  a. 07/2019 Echo: EF 60-65%, no rwma, nl RV fxn, triv MR. No date: GERD (gastroesophageal reflux disease) No date: Headache     Comment:  Migraines No date: IBS (irritable bowel syndrome) No date: Kidney stone No date: Low serum vitamin B12 No date: Lung nodules No date: Migraine     Comment:  down to approx 4x/mo since starting meds No date: Muscle spasm No date: Nonobstructive CAD (coronary artery disease)     Comment:  a. 06/2014 ETT: Ex time 8:59, max HR 155, 10.1 METS, no                ST/T changes; b. 08/2019 Cor CTA: Ca2+ 175 (99th %'ile),               LAD 35-49p, LCX 0-93m-->Med rx. No date: Psoriasis     Comment:  vaginal area No date: Seasonal allergies No date: Sleep apnea     Comment:  history no longer a problem since weight loss surgery No date: Vasovagal syncope     Comment:  [place], Dr. Ferdinand  No date: Vitamin D  deficiency  Past Surgical History: 2010: BLADDER SURGERY 2014: BREAST BIOPSY; Right     Comment:  stereotatic biopsy 2010: CHOLECYSTECTOMY 2014 : COLONOSCOPY     Comment:  Done at [facility] 01/28/2018: COLONOSCOPY WITH PROPOFOL ; N/A     Comment:  Procedure: COLONOSCOPY WITH PROPOFOL ;  Surgeon:               Janalyn Keene NOVAK, MD;  Location: [facility];              Service: Endoscopy;  Laterality: N/A; 06/16/2015: DILITATION & CURRETTAGE/HYSTROSCOPY WITH NOVASURE  ABLATION; N/A     Comment:  Procedure: DILATATION & CURETTAGE/HYSTEROSCOPY WITH               NOVASURE ABLATION;  Surgeon: Charlie Flowers, MD;  Location: [facility];  Service: Gynecology;  Laterality: N/A; 01/28/2018: ESOPHAGOGASTRODUODENOSCOPY (EGD) WITH PROPOFOL ; N/A     Comment:  Procedure: ESOPHAGOGASTRODUODENOSCOPY (EGD) WITH               BIOPSIES;  Surgeon: Janalyn Keene NOVAK, MD;  Location:               [facility];  Service: Endoscopy;  Laterality:               N/A; 11/03/2019: GASTRIC ROUX-EN-Y; N/A     Comment:  Procedure: LAPAROSCOPIC ROUX-EN-Y GASTRIC BYPASS               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 WITH UPPER               ENDOSCOPY;  Surgeon: Gladis Cough, MD;  Location: [facility];  Service: General;  Laterality: N/A; 11/03/2019: HIATAL HERNIA REPAIR; N/A     Comment:  Procedure: HERNIA REPAIR HIATAL;  Surgeon: Gladis Cough, MD;  Location: [facility];  Service: General;                Laterality: N/A; 2012: LAPAROSCOPIC GASTRIC SLEEVE RESECTION 06/16/2015: LAPAROSCOPY; N/A     Comment:   Procedure: LAPAROSCOPY DIAGNOSTIC;  Surgeon: Charlie Flowers, MD;  Location: [facility];  Service: Gynecology;                Laterality: N/A; 04/01/2018: LAPAROSCOPY; N/A     Comment:  Procedure: LAPAROSCOPY DIAGNOSTIC ERAS PATHWAY               ENTEROLYSIS, CECOPEXY;  Surgeon: Gladis Cough, MD;                Location: [facility];  Service: General;  Laterality: N/A; 12/11/2017: LITHOTRIPSY     Comment:  laser lithotripsy 06/16/2015: LYSIS OF ADHESION; N/A     Comment:  Procedure: LYSIS OF ADHESION;  Surgeon: Charlie Flowers,               MD;  Location: [facility];  Service: Gynecology;  Laterality:              N/A; No date: PLANTAR FASCIA SURGERY; Bilateral 06/16/2015: ROBOTIC ASSISTED SALPINGO OOPHERECTOMY; Right     Comment:  Procedure: ROBOTIC ASSISTED SALPINGO OOPHORECTOMY,               EXCISION OF RIGHT CUL DE SAC MASS;  Surgeon: Charlie Flowers, MD;  Location: [facility];  Service: Gynecology;                Laterality: Right; No date: TONSILLECTOMY No date: UPPER GI ENDOSCOPY     Reproductive/Obstetrics negative OB ROS                              Anesthesia Physical Anesthesia Plan  ASA: 3  Anesthesia Plan: General   Post-op Pain Management:    Induction:   PONV Risk Score and Plan: Propofol  infusion and TIVA  Airway Management Planned:   Additional Equipment:   Intra-op Plan:   Post-operative Plan:   Informed Consent: I have reviewed the patients History and  Physical, chart, labs and discussed the procedure including the risks, benefits and alternatives for the proposed anesthesia with the patient or authorized representative who has indicated his/her understanding and acceptance.     Dental Advisory Given  Plan Discussed with: CRNA and Surgeon  Anesthesia Plan Comments:          Anesthesia Quick Evaluation

## 2023-10-24 NOTE — H&P (Signed)
 Ruel Kung , MD [address], [place], [place], [address] Phone: [phone] Fax: [phone]  Primary Care Physician:  Glenard Mire, MD   Pre-Procedure History & Physical: HPI:  Claire [name] is a 53 y.o. female is here for an endoscopy    Past Medical History:  Diagnosis Date   Anemia    Anxiety    Arthritis    hands, hips   Asthma    Bilateral leg cramps    Depression    Diabetes mellitus without complication (HCC)    history no longer a problem since weight loss surgery   DJD (degenerative joint disease)    Dyspnea on exertion    a. 07/2019 Echo: EF 60-65%, no rwma, nl RV fxn, triv MR.   GERD (gastroesophageal reflux disease)    Headache    Migraines   IBS (irritable bowel syndrome)    Kidney stone    Low serum vitamin B12    Lung nodules    Migraine    down to approx 4x/mo since starting meds   Muscle spasm    Nonobstructive CAD (coronary artery disease)    a. 06/2014 ETT: Ex time 8:59, max HR 155, 10.1 METS, no ST/T changes; b. 08/2019 Cor CTA: Ca2+ 175 (99th %'ile), LAD 35-49p, LCX 0-108m-->Med rx.   Psoriasis    vaginal area   Seasonal allergies    Sleep apnea    history no longer a problem since weight loss surgery   Vasovagal syncope    [place], Dr. Ferdinand    Vitamin D  deficiency     Past Surgical History:  Procedure Laterality Date   BLADDER SURGERY  2010   BREAST BIOPSY Right 2014   stereotatic biopsy   CHOLECYSTECTOMY  2010   COLONOSCOPY  2014    Done at [facility]   COLONOSCOPY WITH PROPOFOL  N/A 01/28/2018   Procedure: COLONOSCOPY WITH PROPOFOL ;  Surgeon: Janalyn Keene NOVAK, MD;  Location: [facility];  Service: Endoscopy;  Laterality: N/A;   DILITATION & CURRETTAGE/HYSTROSCOPY WITH NOVASURE ABLATION N/A 06/16/2015   Procedure: DILATATION & CURETTAGE/HYSTEROSCOPY WITH NOVASURE ABLATION;  Surgeon: Charlie Flowers, MD;  Location: [facility];  Service: Gynecology;  Laterality: N/A;   ESOPHAGOGASTRODUODENOSCOPY (EGD) WITH  PROPOFOL  N/A 01/28/2018   Procedure: ESOPHAGOGASTRODUODENOSCOPY (EGD) WITH BIOPSIES;  Surgeon: Janalyn Keene NOVAK, MD;  Location: [facility];  Service: Endoscopy;  Laterality: N/A;   GASTRIC ROUX-EN-Y N/A 11/03/2019   Procedure: LAPAROSCOPIC 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 WITH UPPER ENDOSCOPY;  Surgeon: Gladis Cough, MD;  Location: [facility];  Service: General;  Laterality: N/A;   HIATAL HERNIA REPAIR N/A 11/03/2019   Procedure: HERNIA REPAIR HIATAL;  Surgeon: Gladis Cough, MD;  Location: [facility];  Service: General;  Laterality: N/A;   LAPAROSCOPIC GASTRIC SLEEVE RESECTION  2012   LAPAROSCOPY N/A 06/16/2015   Procedure: LAPAROSCOPY DIAGNOSTIC;  Surgeon: Charlie Flowers, MD;  Location: [facility];  Service: Gynecology;  Laterality: N/A;   LAPAROSCOPY N/A 04/01/2018   Procedure: LAPAROSCOPY DIAGNOSTIC ERAS PATHWAY ENTEROLYSIS, CECOPEXY;  Surgeon: Gladis Cough, MD;  Location: [facility];  Service: General;  Laterality: N/A;   LITHOTRIPSY  12/11/2017   laser lithotripsy   LYSIS OF ADHESION N/A 06/16/2015   Procedure: LYSIS OF ADHESION;  Surgeon: Charlie Flowers, MD;  Location: [facility];  Service: Gynecology;  Laterality: N/A;   PLANTAR FASCIA SURGERY Bilateral    ROBOTIC ASSISTED SALPINGO OOPHERECTOMY Right 06/16/2015   Procedure: ROBOTIC ASSISTED SALPINGO OOPHORECTOMY, EXCISION OF RIGHT CUL  DE [facility] MASS;  Surgeon: Charlie Flowers, MD;  Location: [facility];  Service: Gynecology;  Laterality: Right;   TONSILLECTOMY     UPPER GI ENDOSCOPY      Prior to Admission medications   Medication Sig Start Date End Date Taking? Authorizing Provider  aspirin  EC 81 MG tablet Take 1 tablet (81 mg total) by mouth daily. Swallow whole. 03/14/20  Yes Agbor-Etang, Redell, MD  atorvastatin  (LIPITOR) 40 MG tablet Take 1 tablet (40 mg total) by mouth daily. 11/20/22 10/24/23 Yes Agbor-Etang, Redell, MD  calcium  citrate-vitamin D  (CALCIUM  CITRATE CHEWY BITE) 500-500 MG-UNIT chewable tablet  Chew 1 tablet by mouth 3 (three) times daily.   Yes [provider]  cyanocobalamin  (VITAMIN B12) 1000 MCG/ML injection INJECT 1 ML INTO THE MUSCLE EVERY 14 DAYS. 07/18/23  Yes Sowles, Krichna, MD  IRON-VITAMIN C PO Take 1 tablet by mouth daily.   Yes [provider]  Lactobacillus (ACIDOPHILUS PO) Take 1 capsule by mouth daily. Probiotic plus Calcium    Yes [provider]  mometasone  (NASONEX ) 50 MCG/ACT nasal spray PLACE 2 SPRAYS INTO THE NOSE DAILY. 03/18/23  Yes Sowles, Krichna, MD  montelukast  (SINGULAIR ) 10 MG tablet TAKE 1 TABLET (10 MG TOTAL) BY MOUTH AT BEDTIME. TAKE 1 TABLET(10 MG) BY MOUTH AT BEDTIME 06/14/23  Yes Sowles, Krichna, MD  Multiple Vitamins-Minerals (MULTIVITAMIN ADULT) CHEW Chew 1 tablet by mouth daily.   Yes [provider]  naltrexone  (DEPADE) 50 MG tablet Take 1 tablet (50 mg total) by mouth daily. 10/23/23 01/21/24 Yes Patel, Seema K, NP  pantoprazole  (PROTONIX ) 40 MG tablet Take 40 mg by mouth daily. 06/21/22  Yes Herminio Miu, MD  pregabalin  (LYRICA ) 300 MG capsule Take 1 capsule (300 mg total) by mouth 2 (two) times daily. 05/06/23  Yes Sowles, Krichna, MD  TRINTELLIX  20 MG TABS tablet Take 1 tablet (20 mg total) by mouth daily. 05/06/23  Yes Sowles, Krichna, MD  Vitamin D , Ergocalciferol , (DRISDOL ) 1.25 MG (50000 UNIT) CAPS capsule Take 1 capsule (50,000 Units total) by mouth every Saturday. 12/08/22  Yes Sowles, Krichna, MD  zonisamide (ZONEGRAN) 50 MG capsule Take 150 mg by mouth daily.  02/10/16  Yes Oneita Na, MD  budesonide -formoterol  (SYMBICORT ) 160-4.5 MCG/ACT inhaler INHALE 2 PUFFS INTO THE LUNGS TWICE A DAY 05/06/23   Sowles, Krichna, MD  clobetasol cream (TEMOVATE) 0.05 % SMARTSIG:Sparingly Topical Twice Daily 10/03/20   [provider]  cyclobenzaprine  (FLEXERIL ) 10 MG tablet Take 10 mg by mouth at bedtime as needed for muscle spasms.    Oneita Na, MD  fluconazole  (DIFLUCAN ) 150 MG tablet Take 1 tablet  (150 mg total) by mouth every other day. 10/22/23   Sowles, Krichna, MD  ketoconazole  (NIZORAL ) 2 % cream Apply 1 Application topically daily. 05/06/23   Sowles, Krichna, MD  ketoconazole  (NIZORAL ) 2 % shampoo Apply 1 application topically 2 (two) times daily as needed (psoriasis).  05/08/18   [provider]  mometasone  (ELOCON ) 0.1 % lotion Apply 1 application topically daily as needed (psoriasis in the ears).  01/09/17   Herminio Miu, MD  nystatin  (MYCOSTATIN /NYSTOP ) powder Apply 1 Application topically 3 (three) times daily. 05/06/23   Sowles, Krichna, MD  ondansetron  (ZOFRAN ) 4 MG tablet Take 4 mg by mouth every 8 (eight) hours as needed. 12/09/19   Oneita Na, MD  Semaglutide , 2 MG/DOSE, (OZEMPIC , 2 MG/DOSE,) 8 MG/3ML SOPN INJECT 2 MG AS DIRECTED ONCE A WEEK. 05/20/23   Sowles, Krichna, MD  traZODone  (DESYREL ) 100 MG tablet Take 2 tablets (  200 mg total) by mouth at bedtime. 05/06/23   Sowles, Krichna, MD  triamcinolone  cream (KENALOG ) 0.1 % Apply 1 application topically 2 (two) times daily. 01/09/21   Sowles, Krichna, MD    Allergies as of 10/07/2023 - Review Complete 09/23/2023  Allergen Reaction Noted   Aspartame and phenylalanine Nausea Only and Other (See Comments) 06/02/2019   Gabapentin  05/16/2016   Glucose Nausea Only and Nausea And Vomiting 05/21/2019   Linzess  [linaclotide ]  11/11/2015   Other Hives and Other (See Comments) 06/30/2012   Triamcinolone  Other (See Comments) 07/10/2021   Cymbalta  [duloxetine  hcl] Palpitations 09/24/2014   Tape Rash 12/11/2017   Tapentadol Rash 01/27/2018    Family History  Problem Relation Age of Onset   Heart attack Mother    Stroke Mother    Multiple myeloma Mother    Hyperlipidemia Father    Lung cancer Father    Heart attack Sister    Lung cancer Maternal Grandfather    Breast cancer Paternal Grandmother    Heart attack Paternal Grandfather    Heart attack Maternal Uncle    Breast cancer Paternal Aunt     Social  History   Socioeconomic History   Marital status: Married    Spouse name: Karleen    Number of children: 3   Years of education: Not on file   Highest education level: 12th grade  Occupational History   Occupation: unemployed   Tobacco Use   Smoking status: Former    Current packs/day: 0.00    Average packs/day: 1 pack/day for 10.0 years (10.0 ttl pk-yrs)    Types: Cigarettes    Start date: 02/20/1983    Quit date: 02/19/1993    Years since quitting: 30.6   Smokeless tobacco: Never  Vaping Use   Vaping status: Never Used  Substance and Sexual Activity   Alcohol use: Yes    Alcohol/week: 0.0 standard drinks of alcohol    Comment: occasionally   Drug use: No   Sexual activity: Yes    Partners: Male    Comment: last sex 04 Jan 2018  Other Topics Concern   Not on file  Social History Narrative   Not working since September 2021 - 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   Overall Financial Resource Strain (CARDIA)    Difficulty of Paying Living Expenses: Not hard at all  Recent Concern: Financial Resource Strain - Medium Risk (10/07/2023)   Received from [facility]   Overall Financial Resource Strain (CARDIA)    Difficulty of Paying Living Expenses: Somewhat hard  Food Insecurity: No Food Insecurity (10/15/2023)   Received from [facility]   Hunger Vital Sign    Within the past 12 months, you worried that your food would run out before you got the money to buy more.: Never true    Within the past 12 months, the food you bought just didn't last and you didn't have money to get more.: Never true  Recent Concern: Food Insecurity - Food Insecurity Present (10/07/2023)   Received from [facility]   Hunger Vital Sign    Within the past 12 months, you  worried that your food would run out before you got the money to buy more.: Sometimes true    Within the past 12  months, the food you bought just didn't last and you didn't have money to get more.: Sometimes true  Transportation Needs: No Transportation Needs (10/15/2023)   Received from [name] - Transportation    In the past 12 months, has lack of transportation kept you from medical appointments or from getting medications?: No    Lack of Transportation (Non-Medical): No  Physical Activity: Inactive (05/04/2023)   Exercise Vital Sign    Days of Exercise per Week: 0 days    Minutes of Exercise per Session: 10 min  Stress: Stress Concern Present (05/04/2023)   [employer] of Occupational Health - Occupational Stress Questionnaire    Feeling of Stress : To some extent  Social Connections: Unknown (05/04/2023)   Social Connection and Isolation Panel    Frequency of Communication with Friends and Family: Twice a week    Frequency of Social Gatherings with Friends and Family: Once a week    Attends Religious Services: Patient declined    Database administrator or Organizations: No    Attends Engineer, structural: Not on file    Marital Status: Married  Catering manager Violence: Not At Risk (12/02/2017)   Humiliation, Afraid, Rape, and Kick questionnaire    Fear of Current or Ex-Partner: No    Emotionally Abused: No    Physically Abused: No    Sexually Abused: No    Review of Systems: See HPI, otherwise negative ROS  Physical Exam: BP 130/74   Pulse 68   Temp (!) 97 F (36.1 C) (Temporal)   Resp 16   SpO2 97%  General:   Alert,  pleasant and cooperative in NAD Head:  Normocephalic and atraumatic. Neck:  Supple; no masses or thyromegaly. Lungs:  Clear throughout to auscultation, normal respiratory effort.    Heart:  +S1, +S2, Regular rate and rhythm, No edema. Abdomen:  Soft, nontender and nondistended. Normal bowel sounds, without  guarding, and without rebound.   Neurologic:  Alert and  oriented x4;  grossly normal neurologically.  Impression/Plan: Claire [name] is here for an endoscopy  to be performed for  evaluation of GERD, also has dysphagia    Risks, benefits, limitations, and alternatives regarding endoscopy have been reviewed with the patient.  Questions have been answered.  All parties agreeable.   Ruel Kung, MD  10/24/2023, 7:53 AM GERD

## 2023-10-24 NOTE — Op Note (Signed)
 [facility] Patient Name: Claire [name] Procedure Date: 10/24/2023 7:13 AM MRN: [id number] Account #: [id number] Date of Birth: Oct 10, 1970 Admit Type: Outpatient Age: 53 Room: [facility] ENDO ROOM 2 Gender: Female Note Status: Finalized Instrument Name: [facility] GI Scope [id number] Procedure:             Upper GI endoscopy Indications:           Follow-up of gastro-esophageal reflux disease Providers:             Ruel Kung MD, MD Referring MD:          Dorette FALCON. Sowles, MD (Referring MD) Medicines:             Monitored Anesthesia Care Complications:         No immediate complications. Procedure:             Pre-Anesthesia Assessment:                        - Prior to the procedure, a History and Physical was                         performed, and patient medications, allergies and                         sensitivities were reviewed. The patient's tolerance                         of previous anesthesia was reviewed.                        - The risks and benefits of the procedure and the                         sedation options and risks were discussed with the                         patient. All questions were answered and informed                         consent was obtained.                        - ASA Grade Assessment: II - A patient with mild                         systemic disease.                        After obtaining informed consent, the endoscope was                         passed under direct vision. Throughout the procedure,                         the patient's blood pressure, pulse, and oxygen                         saturations were monitored continuously. The Endoscope  was introduced through the mouth, and advanced to the                         jejunum. The upper GI endoscopy was accomplished with                         ease. The patient tolerated the procedure well. Findings:      The examined esophagus  was normal. Biopsies were taken with a cold       forceps for histology.      The examined jejunum was normal.      Evidence of a gastric bypass was found. A gastric pouch with a small       size was found. The staple line appeared intact. The gastrojejunal       anastomosis was characterized by healthy appearing mucosa. This was       traversed. The pouch-to-jejunum limb was characterized by healthy       appearing mucosa. The jejunojejunal anastomosis was characterized by       healthy appearing mucosa.      The gastroesophageal flap valve was visualized endoscopically and       classified as Hill Grade III (minimal fold, loose to endoscope, hiatal       hernia likely). Impression:            - Normal esophagus. Biopsied.                        - Normal examined jejunum.                        - Gastric bypass with a small-sized pouch and intact                         staple line. Gastrojejunal anastomosis characterized                         by healthy appearing mucosa.                        - Gastroesophageal flap valve classified as Hill Grade                         III (minimal fold, loose to endoscope, hiatal hernia                         likely). Recommendation:        - Discharge patient to home (with escort).                        - Resume previous diet.                        - Continue present medications.                        - Await pathology results.                        - Return to GI office as previously scheduled. Procedure Code(s):     --- Professional ---  56760, Esophagogastroduodenoscopy, flexible,                         transoral; with biopsy, single or multiple Diagnosis Code(s):     --- Professional ---                        Z98.84, Bariatric surgery status                        K21.9, Gastro-esophageal reflux disease without                         esophagitis CPT copyright 2022 American Medical Association. All rights  reserved. The codes documented in this report are preliminary and upon coder review may  be revised to meet current compliance requirements. Ruel Kung, MD Ruel Kung MD, MD 10/24/2023 8:07:29 AM This report has been signed electronically. Number of Addenda: 0 Note Initiated On: 10/24/2023 7:13 AM Estimated Blood Loss:  Estimated blood loss: none.      [facility]

## 2023-10-25 LAB — SURGICAL PATHOLOGY

## 2023-10-28 LAB — HM DIABETES EYE EXAM

## 2023-11-06 ENCOUNTER — Encounter: Payer: Self-pay | Admitting: Family Medicine

## 2023-11-06 ENCOUNTER — Telehealth: Payer: PRIVATE HEALTH INSURANCE | Admitting: Family Medicine

## 2023-11-06 ENCOUNTER — Other Ambulatory Visit: Payer: Self-pay | Admitting: Family Medicine

## 2023-11-06 DIAGNOSIS — I2510 Atherosclerotic heart disease of native coronary artery without angina pectoris: Secondary | ICD-10-CM

## 2023-11-06 DIAGNOSIS — Z1231 Encounter for screening mammogram for malignant neoplasm of breast: Secondary | ICD-10-CM

## 2023-11-06 DIAGNOSIS — E1159 Type 2 diabetes mellitus with other circulatory complications: Secondary | ICD-10-CM

## 2023-11-06 DIAGNOSIS — U071 COVID-19: Secondary | ICD-10-CM | POA: Diagnosis not present

## 2023-11-06 DIAGNOSIS — E538 Deficiency of other specified B group vitamins: Secondary | ICD-10-CM

## 2023-11-06 DIAGNOSIS — E559 Vitamin D deficiency, unspecified: Secondary | ICD-10-CM

## 2023-11-06 DIAGNOSIS — G43109 Migraine with aura, not intractable, without status migrainosus: Secondary | ICD-10-CM

## 2023-11-06 DIAGNOSIS — J45909 Unspecified asthma, uncomplicated: Secondary | ICD-10-CM

## 2023-11-06 DIAGNOSIS — I700 Atherosclerosis of aorta: Secondary | ICD-10-CM

## 2023-11-06 DIAGNOSIS — M797 Fibromyalgia: Secondary | ICD-10-CM

## 2023-11-06 DIAGNOSIS — G43909 Migraine, unspecified, not intractable, without status migrainosus: Secondary | ICD-10-CM

## 2023-11-06 DIAGNOSIS — F349 Persistent mood [affective] disorder, unspecified: Secondary | ICD-10-CM

## 2023-11-06 DIAGNOSIS — F5104 Psychophysiologic insomnia: Secondary | ICD-10-CM

## 2023-11-06 DIAGNOSIS — G4700 Insomnia, unspecified: Secondary | ICD-10-CM

## 2023-11-06 DIAGNOSIS — J4530 Mild persistent asthma, uncomplicated: Secondary | ICD-10-CM

## 2023-11-06 DIAGNOSIS — Z7985 Long-term (current) use of injectable non-insulin antidiabetic drugs: Secondary | ICD-10-CM

## 2023-11-06 MED ORDER — OZEMPIC (2 MG/DOSE) 8 MG/3ML ~~LOC~~ SOPN: 2.0000 mg | PEN_INJECTOR | SUBCUTANEOUS | 0 refills | Status: AC

## 2023-11-06 MED ORDER — BUDESONIDE-FORMOTEROL FUMARATE 160-4.5 MCG/ACT IN AERO: INHALATION_SPRAY | RESPIRATORY_TRACT | 2 refills | Status: DC

## 2023-11-06 MED ORDER — PREGABALIN 300 MG PO CAPS: 300.0000 mg | ORAL_CAPSULE | Freq: Two times a day (BID) | ORAL | 0 refills | Status: AC

## 2023-11-06 MED ORDER — VITAMIN D (ERGOCALCIFEROL) 1.25 MG (50000 UNIT) PO CAPS: 50000.0000 [IU] | ORAL_CAPSULE | ORAL | 1 refills | Status: AC

## 2023-11-06 MED ORDER — TRINTELLIX 20 MG PO TABS: 20.0000 mg | ORAL_TABLET | Freq: Every day | ORAL | 0 refills | Status: AC

## 2023-11-06 MED ORDER — TRAZODONE HCL 100 MG PO TABS: 200.0000 mg | ORAL_TABLET | Freq: Every day | ORAL | 0 refills | Status: DC

## 2023-11-06 MED ORDER — BENZONATATE 100 MG PO CAPS: 100.0000 mg | ORAL_CAPSULE | Freq: Three times a day (TID) | ORAL | 0 refills | Status: DC | PRN

## 2023-11-06 MED ORDER — NIRMATRELVIR/RITONAVIR (PAXLOVID)TABLET: 3.0000 | ORAL_TABLET | Freq: Two times a day (BID) | ORAL | 0 refills | Status: AC

## 2023-11-06 NOTE — Progress Notes (Signed)
 Name: Claire [name]   MRN: [id number]    DOB: 02/11/1971   Date:11/06/2023       Progress Note  Subjective  Chief Complaint  Chief Complaint  Patient presents with   Medical Management of Chronic Issues    I connected with  Claire [name]  on 11/06/23 at  2:40 PM EDT by a video enabled telemedicine application and verified that I am speaking with the correct person using two identifiers.  I discussed the limitations of evaluation and management by telemedicine and the availability of in person appointments. The patient expressed understanding and agreed to proceed with the virtual visit  Staff also discussed with the patient that there may be a patient responsible charge related to this service. Patient Location: home  Provider Location: [facility] Additional Individuals present: alone   Discussed the use of AI scribe software for clinical note transcription with the patient, who gave verbal consent to proceed.  History of Present Illness Claire [name] is a 53 year old female with asthma and type 2 diabetes who presents with COVID-19 symptoms.  She tested positive for COVID-19 at home yesterday, marking her fourth episode. Her first episode was severe, but the subsequent two were mild. Currently, she experiences body aches, congestion, and a persistent cough causing pain in her back and neck. She also has a sore throat and suspects an ear infection due to pain in her right ear and neck. She has had difficulty sleeping, getting only six hours of sleep over the past two days due to coughing and throat clearing.  She has a history of asthma and feels short of breath, though she is not wheezing. She took Mucinex  D after breakfast, which helped with drainage but caused her heart to race. She uses Nasonex  nasal spray and continues her asthma medication, Symbicort , twice daily when sick. She also takes Singulair  daily.  She has type 2 diabetes and is on Ozempic , with her last A1c in  March. She was due for a check today. She also has dyslipidemia and is on atorvastatin  prescribed by her cardiologist. Denies polyphagia, polydipsia or polyuria   She experiences insomnia and takes trazodone  for sleep. For her mood disorder, she is on Trintellix . She also takes vitamin D  supplements and B12.  She has a history of migraines, managed with zonisamide prescribed by Dr. Oneita. She experiences one to two migraines per month and uses cyclobenzaprine  as needed for relief. She also takes naltrexone  50 mg from the pain clinic, Lyrica  for chronic pain and FMS  She has atherosclerosis of the aorta and is on atorvastatin  for this condition. No recent chest pain. She is due for a mammogram and plans to schedule it with Norval.    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/03/2019   B12 deficiency 04/10/2018   Columnar epithelial-lined lower esophagus  Postprocedural disorder of digestive system    Esophageal dysphagia    Persistent mood (affective) disorder, unspecified (HCC) 01/15/2018   Insomnia related to another mental disorder 01/15/2018   OCD (obsessive compulsive disorder) 10/29/2017   GAD (generalized anxiety disorder) 10/29/2017   Nephrolithiasis 10/22/2017   Atherosclerosis of aorta (HCC) 12/21/2016   Hiatal hernia  08/17/2016   Diverticulosis of colon 08/17/2016   Lumbosacral pain 01/07/2015   Intertrigo 11/05/2014   Perennial allergic rhinitis 09/03/2014   Lung nodules 09/03/2014   Vitamin D  deficiency 09/03/2014   History of kidney stones 09/03/2014   History of iron deficiency anemia 09/03/2014   Mild persistent asthma without complication 11/21/2012   GERD without esophagitis 11/21/2012   History of hyperlipidemia 11/21/2012   Migraine with aura and without status migrainosus 11/21/2012   History of sleep apnea 11/21/2012   IBS (irritable bowel syndrome) 08/15/2012    Past Surgical History:  Procedure Laterality Date   BLADDER SURGERY  2010   BREAST BIOPSY Right 2014   stereotatic biopsy   CHOLECYSTECTOMY  2010   COLONOSCOPY  2014   [facility] at [facility]   COLONOSCOPY WITH PROPOFOL  N/A 01/28/2018   Procedure: COLONOSCOPY WITH PROPOFOL ;  Surgeon: Janalyn Keene NOVAK, MD;  Location: [facility];  Service: Endoscopy;  Laterality: N/A;   DILITATION & CURRETTAGE/HYSTROSCOPY WITH NOVASURE ABLATION N/A 06/16/2015   Procedure: DILATATION & CURETTAGE/HYSTEROSCOPY WITH NOVASURE ABLATION;  Surgeon: Charlie Flowers, MD;  Location: [facility];  Service: Gynecology;  Laterality: N/A;   ESOPHAGOGASTRODUODENOSCOPY N/A 10/24/2023   Procedure: EGD (ESOPHAGOGASTRODUODENOSCOPY);  Surgeon: Therisa Bi, MD;  Location: [facility];  Service: Gastroenterology;  Laterality: N/A;   ESOPHAGOGASTRODUODENOSCOPY (EGD) WITH PROPOFOL  N/A 01/28/2018   Procedure: ESOPHAGOGASTRODUODENOSCOPY (EGD) WITH BIOPSIES;  Surgeon: Janalyn Keene NOVAK, MD;  Location: [facility];  Service: Endoscopy;  Laterality: N/A;   GASTRIC ROUX-EN-Y N/A 11/03/2019   Procedure: LAPAROSCOPIC 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 WITH UPPER ENDOSCOPY;  Surgeon: Gladis Cough, MD;  Location: [facility];  Service: General;  Laterality: N/A;   HERNIA REPAIR  2012   HIATAL HERNIA REPAIR N/A 11/03/2019    Procedure: HERNIA REPAIR HIATAL;  Surgeon: Gladis Cough, MD;  Location: [facility];  Service: General;  Laterality: N/A;   LAPAROSCOPIC GASTRIC SLEEVE RESECTION  2012   LAPAROSCOPY N/A 06/16/2015   Procedure: LAPAROSCOPY DIAGNOSTIC;  Surgeon: Charlie Flowers, MD;  Location: [facility];  Service: Gynecology;  Laterality: N/A;   LAPAROSCOPY N/A 04/01/2018   Procedure: LAPAROSCOPY DIAGNOSTIC ERAS PATHWAY ENTEROLYSIS, CECOPEXY;  Surgeon: Gladis Cough, MD;  Location: [facility];  Service: General;  Laterality: N/A;   LITHOTRIPSY  12/11/2017   laser lithotripsy   LYSIS OF ADHESION N/A 06/16/2015   Procedure: LYSIS OF ADHESION;  Surgeon: Charlie Flowers, MD;  Location: [facility];  Service: Gynecology;  Laterality: N/A;   PLANTAR FASCIA SURGERY Bilateral    ROBOTIC ASSISTED SALPINGO OOPHERECTOMY Right 06/16/2015   Procedure: ROBOTIC ASSISTED SALPINGO OOPHORECTOMY, EXCISION OF RIGHT CUL DE SAC MASS;  Surgeon: Charlie Flowers, MD;  Location: [facility];  Service: Gynecology;  Laterality: Right;   TONSILLECTOMY     UPPER GI ENDOSCOPY      Family History  Problem Relation Age of Onset   Heart attack Mother    Stroke Mother    Multiple myeloma Mother    Anxiety disorder Mother    Arthritis Mother    Depression Mother    Diabetes Mother    Heart disease Mother    Hyperlipidemia Father  Lung cancer Father    Asthma Father    COPD Father    Diabetes Father    Heart disease Father    Heart attack Sister    Lung cancer Maternal Grandfather    Breast cancer Paternal Grandmother    Heart attack Paternal Grandfather    Heart attack Maternal Uncle    Breast cancer Paternal Aunt    Diabetes Maternal Grandmother    ADD / ADHD Sister    Early death Sister    ADD / ADHD Daughter    ADD / ADHD Son    ADD / ADHD Son     Social History   Socioeconomic History   Marital status: Married    Spouse name: Karleen    Number of children: 3   Years of education: Not on file   Highest education level: Some college,  no degree  Occupational History   Occupation: unemployed   Tobacco Use   Smoking status: Former    Current packs/day: 0.00    Average packs/day: 1 pack/day for 10.0 years (10.0 ttl pk-yrs)    Types: Cigarettes    Start date: 02/20/1983    Quit date: 02/19/1993    Years since quitting: 30.7   Smokeless tobacco: Never  Vaping Use   Vaping status: Never Used  Substance and Sexual Activity   Alcohol use: Yes    Alcohol/week: 0.0 standard drinks of alcohol    Comment: occasionally   Drug use: No   Sexual activity: Yes    Partners: Male    Comment: last sex 04 Jan 2018  Other Topics Concern   Not on file  Social History Narrative   Not working since September 2021 - 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, 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    Social Drivers of Corporate investment banker Strain: Medium Risk (11/04/2023)   Overall Financial Resource Strain (CARDIA)    Difficulty of Paying Living Expenses: Somewhat hard  Food Insecurity: Food Insecurity Present (11/04/2023)   Hunger Vital Sign    Worried About Running Out of Food in the Last Year: Sometimes true    Ran Out of Food in the Last Year: Never true  Transportation Needs: No Transportation Needs (11/04/2023)   PRAPARE - Administrator, Civil Service (Medical): No    Lack of Transportation (Non-Medical): No  Physical Activity: Unknown (11/04/2023)   Exercise Vital Sign    Days of Exercise per Week: Patient declined    Minutes of Exercise per Session: Not on file  Stress: No Stress Concern Present (11/04/2023)   [employer] of Occupational Health - Occupational Stress Questionnaire    Feeling of Stress: Only a little  Social Connections: Moderately Isolated (11/04/2023)   Social Connection and Isolation Panel    Frequency of Communication with Friends and Family: Twice a week    Frequency of Social Gatherings with Friends and Family: Once a week    Attends Religious Services: Patient  declined    Database administrator or Organizations: No    Attends Engineer, structural: Not on file    Marital Status: Married  Catering manager Violence: Not At Risk (12/02/2017)   Humiliation, Afraid, Rape, and Kick questionnaire    Fear of Current or Ex-Partner: No    Emotionally Abused: No    Physically Abused: No    Sexually Abused: No     Current Outpatient Medications:    aspirin  EC 81 MG tablet,  Take 1 tablet (81 mg total) by mouth daily. Swallow whole., Disp: 90 tablet, Rfl: 3   atorvastatin  (LIPITOR) 40 MG tablet, Take 1 tablet (40 mg total) by mouth daily., Disp: 90 tablet, Rfl: 3   benzonatate  (TESSALON ) 100 MG capsule, Take 1-2 capsules (100-200 mg total) by mouth 3 (three) times daily as needed for cough., Disp: 40 capsule, Rfl: 0   calcium  citrate-vitamin D  (CALCIUM  CITRATE CHEWY BITE) 500-500 MG-UNIT chewable tablet, Chew 1 tablet by mouth 3 (three) times daily., Disp: , Rfl:    clobetasol cream (TEMOVATE) 0.05 %, SMARTSIG:Sparingly Topical Twice Daily, Disp: , Rfl:    cyanocobalamin  (VITAMIN B12) 1000 MCG/ML injection, INJECT 1 ML INTO THE MUSCLE EVERY 14 DAYS., Disp: 6 mL, Rfl: 0   cyclobenzaprine  (FLEXERIL ) 10 MG tablet, Take 10 mg by mouth at bedtime as needed for muscle spasms., Disp: , Rfl:    fluconazole  (DIFLUCAN ) 150 MG tablet, Take 1 tablet (150 mg total) by mouth every other day., Disp: 3 tablet, Rfl: 0   IRON-VITAMIN C PO, Take 1 tablet by mouth daily., Disp: , Rfl:    ketoconazole  (NIZORAL ) 2 % cream, Apply 1 Application topically daily., Disp: 120 g, Rfl: 1   ketoconazole  (NIZORAL ) 2 % shampoo, Apply 1 application topically 2 (two) times daily as needed (psoriasis). , Disp: , Rfl:    Lactobacillus (ACIDOPHILUS PO), Take 1 capsule by mouth daily. 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 , Disp: , Rfl: 4   mometasone  (NASONEX ) 50 MCG/ACT nasal spray,  PLACE 2 SPRAYS INTO THE NOSE DAILY., Disp: 51 each, Rfl: 1   montelukast  (SINGULAIR ) 10 MG tablet, TAKE 1 TABLET (10 MG TOTAL) BY MOUTH AT BEDTIME. TAKE 1 TABLET(10 MG) BY MOUTH AT BEDTIME, Disp: 90 tablet, Rfl: 1   Multiple Vitamins-Minerals (MULTIVITAMIN ADULT) CHEW, Chew 1 tablet by mouth daily., Disp: , Rfl:    naltrexone  (DEPADE) 50 MG tablet, Take 1 tablet (50 mg total) by mouth daily., Disp: 30 tablet, Rfl: 2   nirmatrelvir /ritonavir  (PAXLOVID ) 20 x 150 MG & 10 x 100MG  TABS, Take 3 tablets by mouth 2 (two) times daily for 5 days. (Take nirmatrelvir  150 mg two tablets twice daily for 5 days and ritonavir  100 mg one tablet twice daily for 5 days) Patient GFR is 106, Disp: 30 tablet, Rfl: 0   nystatin  (MYCOSTATIN /NYSTOP ) powder, Apply 1 Application topically 3 (three) times daily., Disp: 60 g, Rfl: 0   ondansetron  (ZOFRAN ) 4 MG tablet, Take 4 mg by mouth every 8 (eight) hours as needed., Disp: , Rfl:    pantoprazole  (PROTONIX ) 40 MG tablet, Take 40 mg by mouth daily., Disp: , Rfl:    triamcinolone  cream (KENALOG ) 0.1 %, Apply 1 application topically 2 (two) times daily., Disp: 453.6 g, Rfl: 0   zonisamide (ZONEGRAN) 50 MG capsule, Take 150 mg by mouth daily. , Disp: , Rfl: 2   budesonide -formoterol  (SYMBICORT ) 160-4.5 MCG/ACT inhaler, INHALE 2 PUFFS INTO THE LUNGS TWICE A DAY, Disp: 10.2 each, Rfl: 2   pregabalin  (LYRICA ) 300 MG capsule, Take 1 capsule (300 mg total) by mouth 2 (two) times daily., Disp: 180 capsule, Rfl: 0   Semaglutide , 2 MG/DOSE, (OZEMPIC , 2 MG/DOSE,) 8 MG/3ML SOPN, Inject 2 mg into the skin once a week., Disp: 9 mL, Rfl: 0   traZODone  (DESYREL ) 100 MG tablet, Take 2 tablets (200 mg total) by mouth at bedtime., Disp: 180 tablet,  Rfl: 0   TRINTELLIX  20 MG TABS tablet, Take 1 tablet (20 mg total) by mouth daily., Disp: 90 tablet, Rfl: 0   [START ON 11/09/2023] Vitamin D , Ergocalciferol , (DRISDOL ) 1.25 MG (50000 UNIT) CAPS capsule, Take 1 capsule (50,000 Units total) by mouth every  Saturday., Disp: 12 capsule, Rfl: 1  Allergies  Allergen Reactions   Aspartame And Phenylalanine Nausea Only and Other (See Comments)    GI upset    Gabapentin     Bladder incontinence at higher dose   Glucose Nausea Only and Nausea And Vomiting   Linzess  [Linaclotide ]     Worsening of diarrhea   Other Hives and Other (See Comments)    Ricotta Cheese: flushed and hot   Triamcinolone  Other (See Comments)   Cymbalta  [Duloxetine  Hcl] Palpitations    And photosensitivity   Tape Rash    Some clear tapes   Tapentadol Rash    Some clear tapes    I personally reviewed active problem list, medication list, allergies with the patient/caregiver today.   ROS Ten systems reviewed and is negative except as mentioned in HPI    Objective  Virtual encounter, vitals not obtained.  There is no height or weight on file to calculate BMI.  Physical Exam  Awake, alert and oriented     PHQ2/9:    11/06/2023   11:31 AM 10/09/2023   12:57 PM 09/23/2023    8:59 AM 08/19/2023   11:52 AM 08/12/2023    9:04 AM  Depression screen PHQ 2/9  Decreased Interest 0 0 0 0 0  Down, Depressed, Hopeless 0 0 0 0 0  PHQ - 2 Score 0 0 0 0 0   PHQ-2/9 Result is negative.    Fall Risk:    11/06/2023   11:30 AM 10/23/2023    8:56 AM 10/09/2023   12:56 PM 09/23/2023    8:59 AM 08/19/2023   11:52 AM  Fall Risk   Falls in the past year? 0 0 0 0 0  Number falls in past yr: 0      Injury with Fall? 0      Risk for fall due to : No Fall Risks      Follow up Falls evaluation completed         Assessment & Plan COVID-19 infection Acute COVID-19 infection confirmed by positive test. Fourth episode with flu-like symptoms, severe cough affecting sleep, and shortness of breath. - Send Paxlovid  prescription to pharmacy to assess insurance coverage. - Prescribe Tessalon  Perles for cough, one to two capsules up to four times daily. - Advise use of saline spray and nasal steroid. - Instruct to continue Symbicort   twice daily. - Advise monitoring of pulse oximetry  - Recommend rest and hydration. - Provide time off work for the rest of the week.  Asthma Asthma exacerbation due to upper respiratory infection. Shortness of breath present. - Prescribe Symbicort  inhaler for daily use during illness. - Ensure Singulair  is taken daily.  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 Due for follow-up labs. - Send refill for Ozempic . - Schedule follow-up for A1c and other labs.  Atherosclerosis of aorta Atherosclerosis of the aorta, managed with atorvastatin .  Dyslipidemia Dyslipidemia managed by cardiologist with atorvastatin .  Morbid obesity Morbid obesity noted as a risk factor for COVID-19 treatment eligibility.  Migraine Migraines controlled with zonisamide.  Fibromyalgia syndrome Fibromyalgia managed with pregabalin . Pain clinic considering taking over management. - Continue pregabalin .  Insomnia Insomnia managed with trazodone . - Continue trazodone .  Persistent mood affective disorder, unspecified Persistent mood affective disorder managed with Trintellix . - Continue Trintellix .  Vitamin D  deficiency Vitamin D  deficiency managed with over-the-counter supplementation. - Continue vitamin D  supplementation.    I discussed the assessment and treatment plan with the patient. The patient was provided an opportunity to ask questions and all were answered. The patient agreed with the plan and demonstrated an understanding of the instructions.  The patient was advised to call back or seek an in-person evaluation if the symptoms worsen or if the condition fails to improve as anticipated.  I provided 25  minutes of non-face-to-face time during this encounter.

## 2023-11-07 ENCOUNTER — Ambulatory Visit: Payer: PRIVATE HEALTH INSURANCE | Admitting: Nurse Practitioner

## 2023-11-07 NOTE — Progress Notes (Deleted)
 Name: Claire [name]   MRN: [id number]    DOB: September 25, 1970   Date:11/07/2023       Progress Note  Subjective  Chief Complaint  No chief complaint on file.   I connected with  Claire [name]  on 11/07/23 at  3:20 PM EDT by a video enabled telemedicine application and verified that I am speaking with the correct person using two identifiers.  I discussed the limitations of evaluation and management by telemedicine and the availability of in person appointments. The patient expressed understanding and agreed to proceed with a virtual visit  Staff also discussed with the patient that there may be a patient responsible charge related to this service. Patient Location: home Provider Location: [facility] Additional Individuals present: alone  HPI   Discussed the use of AI scribe software for clinical note transcription with the patient, who gave verbal consent to proceed.  History of Present Illness     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/03/2019   B12 deficiency 04/10/2018   Columnar epithelial-lined lower esophagus    Postprocedural disorder of digestive system    Esophageal dysphagia    Persistent mood (affective) disorder, unspecified (HCC) 01/15/2018   Insomnia related to another mental disorder 01/15/2018   OCD  (obsessive compulsive disorder) 10/29/2017   GAD (generalized anxiety disorder) 10/29/2017   Nephrolithiasis 10/22/2017   Atherosclerosis of aorta (HCC) 12/21/2016   Hiatal hernia 08/17/2016   Diverticulosis of colon 08/17/2016   Lumbosacral pain 01/07/2015   Intertrigo 11/05/2014   Perennial allergic rhinitis 09/03/2014   Lung nodules 09/03/2014   Vitamin D  deficiency 09/03/2014   History of kidney stones 09/03/2014   History of iron deficiency anemia 09/03/2014   Mild persistent asthma without complication 11/21/2012   GERD without esophagitis 11/21/2012   History of hyperlipidemia 11/21/2012   Migraine with aura and without status migrainosus 11/21/2012   History of sleep apnea 11/21/2012   IBS (irritable bowel syndrome) 08/15/2012    Social History   Tobacco Use   Smoking status: Former    Current packs/day: 0.00    Average packs/day: 1 pack/day for 10.0 years (10.0 ttl pk-yrs)    Types: Cigarettes    Start date: 02/20/1983    Quit date: 02/19/1993    Years since quitting: 30.7   Smokeless tobacco: Never  Substance Use Topics   Alcohol use: Yes    Alcohol/week: 0.0 standard drinks of alcohol    Comment: occasionally     Current Outpatient Medications:    aspirin  EC 81 MG tablet, Take 1 tablet (81 mg total) by mouth daily. Swallow whole., Disp: 90 tablet, Rfl: 3   atorvastatin  (LIPITOR) 40 MG tablet, Take 1 tablet (40 mg total) by mouth daily., Disp: 90 tablet, Rfl: 3   benzonatate  (TESSALON ) 100 MG capsule, Take 1-2 capsules (100-200 mg total) by mouth 3 (  three) times daily as needed for cough., Disp: 40 capsule, Rfl: 0   budesonide -formoterol  (SYMBICORT ) 160-4.5 MCG/ACT inhaler, INHALE 2 PUFFS INTO THE LUNGS TWICE A DAY, Disp: 10.2 each, Rfl: 2   calcium  citrate-vitamin D  (CALCIUM  CITRATE CHEWY BITE) 500-500 MG-UNIT chewable tablet, Chew 1 tablet by mouth 3 (three) times daily., Disp: , Rfl:    clobetasol cream (TEMOVATE) 0.05 %, SMARTSIG:Sparingly Topical Twice Daily,  Disp: , Rfl:    cyanocobalamin  (VITAMIN B12) 1000 MCG/ML injection, INJECT 1 ML INTO THE MUSCLE EVERY 14 DAYS., Disp: 6 mL, Rfl: 0   cyclobenzaprine  (FLEXERIL ) 10 MG tablet, Take 10 mg by mouth at bedtime as needed for muscle spasms., Disp: , Rfl:    fluconazole  (DIFLUCAN ) 150 MG tablet, Take 1 tablet (150 mg total) by mouth every other day., Disp: 3 tablet, Rfl: 0   IRON-VITAMIN C PO, Take 1 tablet by mouth daily., Disp: , Rfl:    ketoconazole  (NIZORAL ) 2 % cream, Apply 1 Application topically daily., Disp: 120 g, Rfl: 1   ketoconazole  (NIZORAL ) 2 % shampoo, Apply 1 application topically 2 (two) times daily as needed (psoriasis). , Disp: , Rfl:    Lactobacillus (ACIDOPHILUS PO), Take 1 capsule by mouth daily. 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 , Disp: , Rfl: 4   mometasone  (NASONEX ) 50 MCG/ACT nasal spray, PLACE 2 SPRAYS INTO THE NOSE DAILY., Disp: 51 each, Rfl: 1   montelukast  (SINGULAIR ) 10 MG tablet, TAKE 1 TABLET (10 MG TOTAL) BY MOUTH AT BEDTIME. TAKE 1 TABLET(10 MG) BY MOUTH AT BEDTIME, Disp: 90 tablet, Rfl: 1   Multiple Vitamins-Minerals (MULTIVITAMIN ADULT) CHEW, Chew 1 tablet by mouth daily., Disp: , Rfl:    naltrexone  (DEPADE) 50 MG tablet, Take 1 tablet (50 mg total) by mouth daily., Disp: 30 tablet, Rfl: 2   nirmatrelvir /ritonavir  (PAXLOVID ) 20 x 150 MG & 10 x 100MG  TABS, Take 3 tablets by mouth 2 (two) times daily for 5 days. (Take nirmatrelvir  150 mg two tablets twice daily for 5 days and ritonavir  100 mg one tablet twice daily for 5 days) Patient GFR is 106, Disp: 30 tablet, Rfl: 0   nystatin  (MYCOSTATIN /NYSTOP ) powder, Apply 1 Application topically 3 (three) times daily., Disp: 60 g, Rfl: 0   ondansetron  (ZOFRAN ) 4 MG tablet, Take 4 mg by mouth every 8 (eight) hours as needed., Disp: , Rfl:    pantoprazole  (PROTONIX ) 40 MG tablet, Take 40 mg by mouth daily., Disp: , Rfl:     pregabalin  (LYRICA ) 300 MG capsule, Take 1 capsule (300 mg total) by mouth 2 (two) times daily., Disp: 180 capsule, Rfl: 0   Semaglutide , 2 MG/DOSE, (OZEMPIC , 2 MG/DOSE,) 8 MG/3ML SOPN, Inject 2 mg into the skin once a week., Disp: 9 mL, Rfl: 0   traZODone  (DESYREL ) 100 MG tablet, Take 2 tablets (200 mg total) by mouth at bedtime., Disp: 180 tablet, Rfl: 0   triamcinolone  cream (KENALOG ) 0.1 %, Apply 1 application topically 2 (two) times daily., Disp: 453.6 g, Rfl: 0   TRINTELLIX  20 MG TABS tablet, Take 1 tablet (20 mg total) by mouth daily., Disp: 90 tablet, Rfl: 0   [START ON 11/09/2023] Vitamin D , Ergocalciferol , (DRISDOL ) 1.25 MG (50000 UNIT) CAPS capsule, Take 1 capsule (50,000 Units total) by mouth every Saturday., Disp: 12 capsule, Rfl: 1   zonisamide (ZONEGRAN) 50 MG capsule, Take 150 mg by mouth daily. , Disp: , Rfl:  2  Allergies  Allergen Reactions   Aspartame And Phenylalanine Nausea Only and Other (See Comments)    GI upset    Gabapentin     Bladder incontinence at higher dose   Glucose Nausea Only and Nausea And Vomiting   Linzess  [Linaclotide ]     Worsening of diarrhea   Other Hives and Other (See Comments)    Ricotta Cheese: flushed and hot   Triamcinolone  Other (See Comments)   Cymbalta  [Duloxetine  Hcl] Palpitations    And photosensitivity   Tape Rash    Some clear tapes   Tapentadol Rash    Some clear tapes    I personally reviewed {Reviewed:14835} with the patient/caregiver today.  ROS  ***  Objective  Virtual encounter, vitals not obtained.  There is no height or weight on file to calculate BMI.  Nursing Note and Vital Signs reviewed.  Physical Exam  ***  No results found for this or any previous visit (from the past 72 hours).  Assessment & Plan  Assessment and Plan Assessment & Plan       -Red flags and when to present for emergency care or RTC including fever >101.39F, chest pain, shortness of breath, new/worsening/un-resolving symptoms,  *** reviewed with patient at time of visit. Follow up and care instructions discussed and provided in AVS. - I discussed the assessment and treatment plan with the patient. The patient was provided an opportunity to ask questions and all were answered. The patient agreed with the plan and demonstrated an understanding of the instructions.  I provided *** minutes of non-face-to-face time during this encounter.  Mliss JULIANNA Spray, FNP

## 2023-11-11 ENCOUNTER — Encounter: Payer: Self-pay | Admitting: Student in an Organized Health Care Education/Training Program

## 2023-11-11 ENCOUNTER — Ambulatory Visit: Payer: PRIVATE HEALTH INSURANCE | Admitting: Student in an Organized Health Care Education/Training Program

## 2023-11-11 ENCOUNTER — Ambulatory Visit
Admission: RE | Admit: 2023-11-11 | Discharge: 2023-11-11 | Disposition: A | Discharge status: Home / Self Care | Payer: PRIVATE HEALTH INSURANCE | Source: Ambulatory Visit | Attending: Student in an Organized Health Care Education/Training Program | Admitting: Student in an Organized Health Care Education/Training Program

## 2023-11-11 VITALS — BP 133/64 | HR 69 | Temp 97.4°F | Resp 18 | Ht 61.0 in | Wt 217.0 lb

## 2023-11-11 DIAGNOSIS — M25551 Pain in right hip: Secondary | ICD-10-CM | POA: Insufficient documentation

## 2023-11-11 DIAGNOSIS — M25552 Pain in left hip: Secondary | ICD-10-CM | POA: Diagnosis present

## 2023-11-11 DIAGNOSIS — M1611 Unilateral primary osteoarthritis, right hip: Secondary | ICD-10-CM | POA: Insufficient documentation

## 2023-11-11 DIAGNOSIS — G8929 Other chronic pain: Secondary | ICD-10-CM

## 2023-11-11 DIAGNOSIS — M545 Low back pain, unspecified: Secondary | ICD-10-CM | POA: Insufficient documentation

## 2023-11-11 MED ORDER — LIDOCAINE HCL 2 % IJ SOLN
20.0000 mL | Freq: Once | INTRAMUSCULAR | Status: AC
  Administered 2023-11-11: 100 mg
  Filled 2023-11-11: qty 40

## 2023-11-11 MED ORDER — IOHEXOL 180 MG/ML  SOLN
10.0000 mL | Freq: Once | INTRAMUSCULAR | Status: AC
  Administered 2023-11-11: 10 mL via INTRA_ARTICULAR
  Filled 2023-11-11: qty 20

## 2023-11-11 MED ORDER — DIAZEPAM 5 MG PO TABS
ORAL_TABLET | ORAL | Status: AC
  Filled 2023-11-11: qty 1

## 2023-11-11 MED ORDER — ROPIVACAINE HCL 2 MG/ML IJ SOLN
4.0000 mL | Freq: Once | INTRAMUSCULAR | Status: AC
  Administered 2023-11-11: 4 mL via PERINEURAL
  Filled 2023-11-11: qty 20

## 2023-11-11 MED ORDER — METHYLPREDNISOLONE ACETATE 40 MG/ML IJ SUSP
40.0000 mg | Freq: Once | INTRAMUSCULAR | Status: AC
  Administered 2023-11-11: 40 mg via INTRA_ARTICULAR
  Filled 2023-11-11: qty 1

## 2023-11-11 NOTE — Patient Instructions (Signed)

## 2023-11-11 NOTE — Progress Notes (Signed)
 PROVIDER NOTE: Interpretation of information contained herein should be left to medically-trained personnel. Specific patient instructions are provided elsewhere under Patient Instructions section of medical record. This document was created in part using STT-dictation technology, any transcriptional errors that may result from this process are unintentional.  Patient: Claire [name] Type: Established DOB: 1970-03-05 MRN: [id number] PCP: Sowles, Krichna, MD  Service: Procedure DOS: 11/11/2023 Setting: Ambulatory Location: Ambulatory outpatient facility Delivery: Face-to-face Provider: Wallie Sherry, MD Specialty: Interventional Pain Management Specialty designation: 09 Location: Outpatient facility Ref. Prov.: Sowles, Krichna, MD       Interventional Therapy   Procedure: Intra-articular hip injection  #1  Laterality: Right (-RT)  Approach: Percutaneous posterolateral approach. Level: Lower pelvic and hip joint level.  Imaging: Fluoroscopy-guided         Anesthesia: Local anesthesia (1-2% Lidocaine )  Sedation: Minimal Sedation                       DOS: 11/11/2023  Performed by: Wallie Sherry, MD  Purpose: Diagnostic/Therapeutic Indications: Hip pain severe enough to impact quality of life or function. Rationale (medical necessity): procedure needed and proper for the diagnosis and/or treatment of Claire [name]'s medical symptoms and needs. 1. Chronic hip pain, bilateral   2. Lumbosacral pain    NAS-11 Pain score:   Pre-procedure: 6 /10   Post-procedure: 6 /10      Target: Intra-articular hip joint Region: Hip joint, upper (proximal) femoral region Type of procedure: Percutaneous joint injection   Position / Prep / Materials:  Position: Supine  Prep solution: ChloraPrep (2% chlorhexidine  gluconate and 70% isopropyl alcohol) Prep Area:  Entire Posterolateral hip area. Materials:  Tray: Block tray Needle(s):  Type: Spinal  Gauge (G): 22  Length: 5-in  Qty: 1  H&P  (Pre-op Assessment):  Claire [name] is a 53 y.o. (year old), female patient, seen today for interventional treatment. She  has a past surgical history that includes Laparoscopic gastric sleeve resection (2012); Cholecystectomy (2010); Bladder surgery (2010); Plantar fascia surgery (Bilateral); Breast biopsy (Right, 2014); Colonoscopy (2014); Upper gi endoscopy; Tonsillectomy; laparoscopy (N/A, 06/16/2015); Robotic assisted salpingo oophorectomy (Right, 06/16/2015); Dilatation & currettage/hysteroscopy with novasure ablation (N/A, 06/16/2015); Lysis of adhesion (N/A, 06/16/2015); Colonoscopy with propofol  (N/A, 01/28/2018); Esophagogastroduodenoscopy (egd) with propofol  (N/A, 01/28/2018); Lithotripsy (12/11/2017); laparoscopy (N/A, 04/01/2018); Gastric Roux-En-Y (N/A, 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 Claire [name] has a current medication list which includes the following prescription(s): aspirin  ec, atorvastatin , benzonatate , budesonide -formoterol , calcium  citrate chewy bite, clobetasol cream, cyanocobalamin , cyclobenzaprine , fluconazole , iron-vitamin c, ketoconazole , ketoconazole , lactobacillus, mometasone , mometasone , montelukast , multivitamin adult, naltrexone , nirmatrelvir /ritonavir , nystatin , ondansetron , pantoprazole , pregabalin , ozempic  (2 mg/dose), trazodone , triamcinolone  cream, trintellix , vitamin d  (ergocalciferol ), and zonisamide. Her primarily concern today is the Hip Pain (right)  Initial Vital Signs:  Pulse/HCG Rate: 69ECG Heart Rate: 61 Temp: (!) 97.4 F (36.3 C) Resp: 18 BP: (!) 125/90 SpO2: 97 %  BMI: Estimated body mass index is 41 kg/m as calculated from the following:   Height as of this encounter: 5' 1 (1.549 m).   Weight as of this encounter: 217 lb (98.4 kg).  Risk Assessment: Allergies: Reviewed. She is allergic to aspartame and phenylalanine, gabapentin, glucose, linzess   [linaclotide ], other, triamcinolone , cymbalta  [duloxetine  hcl], duloxetine , tape, and tapentadol.  Allergy Precautions: None required Coagulopathies: Reviewed. None identified.  Blood-thinner therapy: None at this time Active Infection(s): Reviewed. None identified. Claire [name] is afebrile  Site Confirmation: Claire [name] was asked to confirm the procedure and laterality  before marking the site Procedure checklist: Completed Consent: Before the procedure and under the influence of no sedative(s), amnesic(s), or anxiolytics, the patient was informed of the treatment options, risks and possible complications. To fulfill our ethical and legal obligations, as recommended by the American Medical Association's Code of Ethics, I have informed the patient of my clinical impression; the nature and purpose of the treatment or procedure; the risks, benefits, and possible complications of the intervention; the alternatives, including doing nothing; the risk(s) and benefit(s) of the alternative treatment(s) or procedure(s); and the risk(s) and benefit(s) of doing nothing. The patient was provided information about the general risks and possible complications associated with the procedure. These may include, but are not limited to: failure to achieve desired goals, infection, bleeding, organ or nerve damage, allergic reactions, paralysis, and death. In addition, the patient was informed of those risks and complications associated to the procedure, such as failure to decrease pain; infection; bleeding; organ or nerve damage with subsequent damage to sensory, motor, and/or autonomic systems, resulting in permanent pain, numbness, and/or weakness of one or several areas of the body; allergic reactions; (i.e.: anaphylactic reaction); and/or death. Furthermore, the patient was informed of those risks and complications associated with the medications. These include, but are not limited to: allergic reactions (i.e.:  anaphylactic or anaphylactoid reaction(s)); adrenal axis suppression; blood sugar elevation that in diabetics may result in ketoacidosis or comma; water  retention that in patients with history of congestive heart failure may result in shortness of breath, pulmonary edema, and decompensation with resultant heart failure; weight gain; swelling or edema; medication-induced neural toxicity; particulate matter embolism and blood vessel occlusion with resultant organ, and/or nervous system infarction; and/or aseptic necrosis of one or more joints. Finally, the patient was informed that Medicine is not an exact science; therefore, there is also the possibility of unforeseen or unpredictable risks and/or possible complications that may result in a catastrophic outcome. The patient indicated having understood very clearly. We have given the patient no guarantees and we have made no promises. Enough time was given to the patient to ask questions, all of which were answered to the patient's satisfaction. Ms. Stefanik has indicated that she wanted to continue with the procedure. Attestation: I, the ordering provider, attest that I have discussed with the patient the benefits, risks, side-effects, alternatives, likelihood of achieving goals, and potential problems during recovery for the procedure that I have provided informed consent. Date  Time: 11/11/2023 11:38 AM  Pre-Procedure Preparation:  Monitoring: As per clinic protocol. Respiration, ETCO2, SpO2, BP, heart rate and rhythm monitor placed and checked for adequate function Safety Precautions: Patient was assessed for positional comfort and pressure points before starting the procedure. Time-out: I initiated and conducted the Time-out before starting the procedure, as per protocol. The patient was asked to participate by confirming the accuracy of the Time Out information. Verification of the correct person, site, and procedure were performed and confirmed by  me, the nursing staff, and the patient. Time-out conducted as per Joint Commission's Universal Protocol (UP.01.01.01). Time: 1226 Start Time: 1226 hrs.  Description/Narrative of Procedure:          Rationale (medical necessity): procedure needed and proper for the diagnosis and/or treatment of the patient's medical symptoms and needs. Procedural Technique Safety Precautions: Aspiration looking for blood return was conducted prior to all injections. At no point did we inject any substances, as a needle was being advanced. No attempts were made at seeking any paresthesias. Safe injection practices and  needle disposal techniques used. Medications properly checked for expiration dates. SDV (single dose vial) medications used. Description of the Procedure: Protocol guidelines were followed. The patient was assisted into a comfortable position. The target area was identified and the area prepped in the usual manner. Skin & deeper tissues infiltrated with local anesthetic. Appropriate amount of time allowed to pass for local anesthetics to take effect. The procedure needles were then advanced to the target area. Proper needle placement secured. Negative aspiration confirmed. Solution injected in intermittent fashion, asking for systemic symptoms every 0.5cc of injectate. The needles were then removed and the area cleansed, making sure to leave some of the prepping solution back to take advantage of its long term bactericidal properties.  Technical description of procedure:  Skin & deeper tissues infiltrated with local anesthetic. Appropriate amount of time allowed to pass for local anesthetics to take effect. The procedure needles were then advanced to the target area. Proper needle placement secured. Negative aspiration confirmed. Solution injected in intermittent fashion, asking for systemic symptoms every 0.5cc of injectate. The needles were then removed and the area cleansed, making sure to leave some of  the prepping solution back to take advantage of its long term bactericidal properties.           Vitals:   11/11/23 1159 11/11/23 1225 11/11/23 1229  BP: (!) 125/90 132/65 133/64  Pulse: 69    Resp: 18 17 18   Temp: (!) 97.4 F (36.3 C)    SpO2: 97% 96% 96%  Weight: 217 lb (98.4 kg)    Height: 5' 1 (1.549 m)       Start Time: 1226 hrs. End Time: 1228 hrs.  Imaging Guidance (Non-Spinal):          Type of Imaging Technique: Fluoroscopy Guidance (Non-Spinal) Indication(s): Fluoroscopy guidance for needle placement to enhance accuracy in procedures requiring precise needle localization for targeted delivery of medication in or near specific anatomical locations not easily accessible without such real-time imaging assistance. Exposure Time: Please see nurses notes. Contrast: Before injecting any contrast, we confirmed that the patient did not have an allergy to iodine, shellfish, or radiological contrast. Once satisfactory needle placement was completed at the desired level, radiological contrast was injected. Contrast injected under live fluoroscopy. No contrast complications. See chart for type and volume of contrast used. Fluoroscopic Guidance: I was personally present during the use of fluoroscopy. Tunnel Vision Technique used to obtain the best possible view of the target area. Parallax error corrected before commencing the procedure. Direction-depth-direction technique used to introduce the needle under continuous pulsed fluoroscopy. Once target was reached, antero-posterior, oblique, and lateral fluoroscopic projection used confirm needle placement in all planes. Images permanently stored in EMR. Interpretation: I personally interpreted the imaging intraoperatively. Adequate needle placement confirmed in multiple planes. Appropriate spread of contrast into desired area was observed. No evidence of afferent or efferent intravascular uptake. Permanent images saved into the patient's  record.  Post-operative Assessment:  Post-procedure Vital Signs:  Pulse/HCG Rate: 69(!) 58 Temp: (!) 97.4 F (36.3 C) Resp: 18 BP: 133/64 SpO2: 96 %  EBL: None  Complications: No immediate post-treatment complications observed by team, or reported by patient.  Note: The patient tolerated the entire procedure well. A repeat set of vitals were taken after the procedure and the patient was kept under observation following institutional policy, for this type of procedure. Post-procedural neurological assessment was performed, showing return to baseline, prior to discharge. The patient was provided with post-procedure discharge instructions, including a  section on how to identify potential problems. Should any problems arise concerning this procedure, the patient was given instructions to immediately contact us , at any time, without hesitation. In any case, we plan to contact the patient by telephone for a follow-up status report regarding this interventional procedure.  Comments:  No additional relevant information.  Plan of Care (POC)  Orders:  Orders Placed This Encounter  Procedures   DG PAIN CLINIC C-ARM 1-60 MIN NO REPORT    Intraoperative interpretation by procedural physician at [facility].    Standing Status:   Standing    Number of Occurrences:   1    Reason for exam::   Assistance in needle guidance and placement for procedures requiring needle placement in or near specific anatomical locations not easily accessible without such assistance.     Medications ordered for procedure: Meds ordered this encounter  Medications   iohexol  (OMNIPAQUE ) 180 MG/ML injection 10 mL    Must be Myelogram-compatible. If not available, you may substitute with a water -soluble, non-ionic, hypoallergenic, myelogram-compatible radiological contrast medium.   lidocaine  (XYLOCAINE ) 2 % (with pres) injection 400 mg   ropivacaine  (PF) 2 mg/mL (0.2%) (NAROPIN ) injection 4 mL    methylPREDNISolone  acetate (DEPO-MEDROL ) injection 40 mg   Medications administered: We administered iohexol , lidocaine , ropivacaine  (PF) 2 mg/mL (0.2%), and methylPREDNISolone  acetate.  See the medical record for exact dosing, route, and time of administration.    TPI- cervical, thoracic 08/19/23; bilateral L3, L4, L5 RFA 10/09/2023. Right IA hip steroid injection 11/11/23     Follow-up plan:   Return in about 4 weeks (around 12/09/2023) for f78f ppe.     Recent Visits Date Type Provider Dept  10/23/23 Office Visit Patel, Seema K, NP [facility]  10/09/23 Procedure visit Marcelino Nurse, MD [facility]  09/23/23 Office Visit Patel, Seema K, NP [facility]  09/02/23 Office Visit Patel, Seema K, NP [facility]  08/19/23 Procedure visit Marcelino Nurse, MD [facility]  Showing recent visits within past 90 days and meeting all other requirements Today's Visits Date Type Provider Dept  11/11/23 Procedure visit Marcelino Nurse, MD [facility]  Showing today's visits and meeting all other requirements Future Appointments Date Type Provider Dept  01/20/24 Appointment Patel, Seema K, NP [facility]  Showing future appointments within next 90 days and meeting all other requirements   Disposition: Discharge home  Discharge (Date  Time): 11/11/2023; 1240 hrs.   Primary Care Physician: Sowles, Krichna, MD Location: [facility] Note by: Nurse Marcelino, MD (TTS technology used. I apologize for any typographical errors that were not detected and corrected.) Date: 11/11/2023; Time: 12:34 PM  Disclaimer:  Medicine is not an Visual merchandiser. The only guarantee in medicine is that nothing is guaranteed. It is important to note that the decision to proceed with this intervention was based on the information collected from the patient. The Data and conclusions were drawn from the patient's questionnaire, the  interview, and the physical examination. Because the information was provided in large part by the patient, it cannot be guaranteed that it has not been purposely or unconsciously manipulated. Every effort has been made to obtain as much relevant data as possible for this evaluation. It is important to note that the conclusions that lead to this procedure are derived in large part from the available data. Always take into account that the treatment will also be dependent on availability of resources and existing treatment  guidelines, considered by other Pain Management Practitioners as being common knowledge and practice, at the time of the intervention. For Medico-Legal purposes, it is also important to point out that variation in procedural techniques and pharmacological choices are the acceptable norm. The indications, contraindications, technique, and results of the above procedure should only be interpreted and judged by a Board-Certified Interventional Pain Specialist with extensive familiarity and expertise in the same exact procedure and technique.

## 2023-11-11 NOTE — Progress Notes (Signed)
 Safety precautions to be maintained throughout the outpatient stay will include: orient to surroundings, keep bed in low position, maintain call bell within reach at all times, provide assistance with transfer out of bed and ambulation.

## 2023-11-13 ENCOUNTER — Telehealth: Payer: Self-pay

## 2023-11-13 NOTE — Telephone Encounter (Signed)
 Post procedure follow up.  LM

## 2023-11-27 ENCOUNTER — Other Ambulatory Visit: Payer: Self-pay | Admitting: Cardiology

## 2023-11-28 NOTE — Telephone Encounter (Signed)
 Please contact pt for future appointment. Pt due for follow up.

## 2023-12-10 ENCOUNTER — Ambulatory Visit
Payer: PRIVATE HEALTH INSURANCE | Attending: Student in an Organized Health Care Education/Training Program | Admitting: Student in an Organized Health Care Education/Training Program

## 2023-12-10 ENCOUNTER — Encounter: Payer: Self-pay | Admitting: Student in an Organized Health Care Education/Training Program

## 2023-12-10 VITALS — BP 136/73 | HR 67 | Temp 98.4°F | Resp 16 | Ht 61.0 in | Wt 212.0 lb

## 2023-12-10 DIAGNOSIS — M1611 Unilateral primary osteoarthritis, right hip: Secondary | ICD-10-CM | POA: Diagnosis present

## 2023-12-10 DIAGNOSIS — G894 Chronic pain syndrome: Secondary | ICD-10-CM | POA: Diagnosis present

## 2023-12-10 DIAGNOSIS — G8929 Other chronic pain: Secondary | ICD-10-CM | POA: Insufficient documentation

## 2023-12-10 DIAGNOSIS — M25551 Pain in right hip: Secondary | ICD-10-CM | POA: Insufficient documentation

## 2023-12-10 DIAGNOSIS — M797 Fibromyalgia: Secondary | ICD-10-CM | POA: Diagnosis present

## 2023-12-10 NOTE — Progress Notes (Signed)
 Safety precautions to be maintained throughout the outpatient stay will include: orient to surroundings, keep bed in low position, maintain call bell within reach at all times, provide assistance with transfer out of bed and ambulation.

## 2023-12-10 NOTE — Progress Notes (Signed)
 PROVIDER NOTE: Interpretation of information contained herein should be left to medically-trained personnel. Specific patient instructions are provided elsewhere under Patient Instructions section of medical record. This document was created in part using AI and STT-dictation technology, any transcriptional errors that may result from this process are unintentional.  Patient: Claire [name]  Service: E/M   PCP: Sowles, Krichna, MD  DOB: 16-Dec-1970  DOS: 12/10/2023  Provider: Wallie Sherry, MD  MRN: [id number]  Delivery: Face-to-face  Specialty: Interventional Pain Management  Type: Established Patient  Setting: Ambulatory outpatient facility  Specialty designation: 09  Referring Prov.: Sowles, Krichna, MD  Location: Outpatient office facility       History of present illness (HPI) Claire [name], a 53 y.o. year old female, is here today because of her Chronic right hip pain [M25.551, G89.29]. Claire [name] primary complain today is Hip Pain (Right  ) and Back Pain (Lumbar bilateral )  Pertinent problems: Claire [name] does not have any pertinent problems on file.  Pain Assessment: Severity of Chronic pain is reported as a 8 /10. Location: Hip Right/down right leg and wrapping around to the groin and buttocks.. Onset: More than a month ago. Quality: Aching, Constant, Sharp, Throbbing, Discomfort. Timing: Constant. Modifying factor(s): nothing currently. Vitals:  height is 5' 1 (1.549 m) and weight is 212 lb (96.2 kg). Her temporal temperature is 98.4 F (36.9 C). Her blood pressure is 136/73 and her pulse is 67. Her respiration is 16 and oxygen saturation is 97%.  BMI: Estimated body mass index is 40.06 kg/m as calculated from the following:   Height as of this encounter: 5' 1 (1.549 m).   Weight as of this encounter: 212 lb (96.2 kg).  Last encounter: Visit date not found. Last procedure: 11/11/2023.  Reason for encounter:   History of Present Illness   Claire [name] is  a 53 year old female with mild to moderate hip osteoarthritis who presents with worsening hip and back pain.  She experiences significant hip pain, initially managed with an injection that provided relief for only about three hours on the first day. The pain gradually worsened thereafter. She is uncertain if she has seen an orthopedic doctor specifically for her hip, but she has consulted one at [facility] in the past. She continues to experience persistent pain.  She also suffers from chronic back pain, having undergone three or four ablations without relief. The pain, initially on the right side, has become bilateral. The combination of hip and back pain significantly impacts her daily activities, limiting her ability to stand for more than ten minutes, which affects her ability to cook, leading her family to rely on leftover foods.         ROS  Constitutional: Denies any fever or chills Gastrointestinal: No reported hemesis, hematochezia, vomiting, or acute GI distress Musculoskeletal: as above Neurological: No reported episodes of acute onset apraxia, aphasia, dysarthria, agnosia, amnesia, paralysis, loss of coordination, or loss of consciousness  Medication Review  Calcium  Citrate-Vitamin D , Iron-Vitamin C, Lactobacillus, Multivitamin Adult, Semaglutide  (2 MG/DOSE), Vitamin D  (Ergocalciferol ), aspirin  EC, atorvastatin , benzonatate , budesonide -formoterol , clobetasol cream, cyanocobalamin , cyclobenzaprine , fluconazole , ketoconazole , mometasone , montelukast , naltrexone , nystatin , ondansetron , pantoprazole , pregabalin , traZODone , triamcinolone  cream, vortioxetine  HBr, and zonisamide  History Review  Allergy: Claire [name] is allergic to aspartame and phenylalanine, gabapentin, glucose, linzess  [linaclotide ], other, triamcinolone , cymbalta  [duloxetine  hcl], duloxetine , tape, and tapentadol. Drug: Claire [name]  reports no history of drug use. Alcohol:  reports current alcohol use. Tobacco:   reports that she quit  smoking about 30 years ago. Her smoking use included cigarettes. She started smoking about 40 years ago. She has a 10 pack-year smoking history. She has never used smokeless tobacco. Social: Claire [name]  reports that she quit smoking about 30 years ago. Her smoking use included cigarettes. She started smoking about 40 years ago. She has a 10 pack-year smoking history. She has never used smokeless tobacco. She reports current alcohol use. She reports that she does not use drugs. Medical:  has a past medical history of Allergy, Anemia, Anxiety, Arthritis, Asthma, Bilateral leg cramps, Depression, Diabetes mellitus without complication (HCC), DJD (degenerative joint disease), Dyspnea on exertion, GERD (gastroesophageal reflux disease), Headache, IBS (irritable bowel syndrome), Kidney stone, Low serum vitamin B12, Lung nodules, Migraine, Muscle spasm, Nonobstructive CAD (coronary artery disease), Oxygen deficiency ([id number]), Psoriasis, Seasonal allergies, Sleep apnea, Vasovagal syncope, and Vitamin D  deficiency. Surgical: Claire [name]  has a past surgical history that includes Laparoscopic gastric sleeve resection (2012); Cholecystectomy (2010); Bladder surgery (2010); Plantar fascia surgery (Bilateral); Breast biopsy (Right, 2014); Colonoscopy (2014); Upper gi endoscopy; Tonsillectomy; laparoscopy (N/A, 06/16/2015); Robotic assisted salpingo oophorectomy (Right, 06/16/2015); Dilatation & currettage/hysteroscopy with novasure ablation (N/A, 06/16/2015); Lysis of adhesion (N/A, 06/16/2015); Colonoscopy with propofol  (N/A, 01/28/2018); Esophagogastroduodenoscopy (egd) with propofol  (N/A, 01/28/2018); Lithotripsy (12/11/2017); laparoscopy (N/A, 04/01/2018); Gastric Roux-En-Y (N/A, 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 Family: family history includes ADD / ADHD in her daughter, sister, son, and son; Anxiety  disorder in her mother; Arthritis in her mother; Asthma in her father; Breast cancer in her paternal aunt and paternal grandmother; COPD in her father; Depression in her mother; Diabetes in her father, maternal grandmother, and mother; Early death in her sister; Heart attack in her maternal uncle, mother, paternal grandfather, and sister; Heart disease in her father and mother; Hyperlipidemia in her father; Lung cancer in her father and maternal grandfather; Multiple myeloma in her mother; Stroke in her mother.  Laboratory Chemistry Profile   Renal Lab Results  Component Value Date   BUN 14 01/11/2023   CREATININE 0.68 01/11/2023   LABCREA 153 01/11/2023   BCR SEE NOTE: 01/11/2023   GFRAA >60 11/04/2019   GFRNONAA >60 11/04/2019    Hepatic Lab Results  Component Value Date   AST 62 (H) 01/11/2023   ALT 77 (H) 01/11/2023   ALBUMIN 4.1 11/03/2019   ALKPHOS 67 11/03/2019   LIPASE 30 04/02/2019    Electrolytes Lab Results  Component Value Date   NA 135 01/11/2023   K 4.0 01/11/2023   CL 101 01/11/2023   CALCIUM  9.7 01/11/2023   MG 1.8 11/04/2019    Bone Lab Results  Component Value Date   VD25OH 31 01/11/2023    Inflammation (CRP: Acute Phase) (ESR: Chronic Phase) No results found for: CRP, ESRSEDRATE, LATICACIDVEN       Note: Above Lab results reviewed.   Physical Exam  Vitals: BP 136/73 (BP Location: Right Arm, Patient Position: Sitting, Cuff Size: Normal)   Pulse 67   Temp 98.4 F (36.9 C) (Temporal)   Resp 16   Ht 5' 1 (1.549 m)   Wt 212 lb (96.2 kg)   SpO2 97%   BMI 40.06 kg/m  BMI: Estimated body mass index is 40.06 kg/m as calculated from the following:   Height as of this encounter: 5' 1 (1.549 m).   Weight as of this encounter: 212 lb (96.2 kg). Ideal: Ideal body weight: 47.8 kg (105 lb 6.1 oz) Adjusted ideal body  weight: 67.1 kg (148 lb 0.5 oz) General appearance: Well nourished, well developed, and well hydrated. In no apparent acute  distress Mental status: Alert, oriented x 3 (person, place, & time)       Respiratory: No evidence of acute respiratory distress Eyes: PERLA  Right hip pain, worse with weightbearing  Assessment   Diagnosis Status  1. Chronic right hip pain   2. Primary osteoarthritis of right hip   3. Fibromyalgia   4. Chronic pain syndrome    Deteriorating Deteriorating Controlled   Updated Problems: Problem  Primary Osteoarthritis of Right Hip  Chronic Right Hip Pain    Plan of Care  Problem-specific:  Assessment and Plan    Right hip pain   Right hip pain persists with only short-term relief from a previous injection, indicating ineffectiveness. X-ray reveals mild to moderate hip osteoarthritis. Differential diagnosis includes tears, tendinitis, or tendinopathy not visible on x-ray. Further imaging is necessary to determine the underlying cause and guide treatment. Order an MRI of the right hip and refer to an orthopedist based on MRI results.  Bilateral lower back pain   Chronic bilateral lower back pain remains unrelieved by previous ablations. Pain has progressed from the right side to both sides, affecting daily activities. Further evaluation is needed to determine the cause and appropriate management.       Claire [name] has a current medication list which includes the following long-term medication(s): atorvastatin , budesonide -formoterol , calcium  citrate chewy bite, mometasone , montelukast , pregabalin , trazodone , and zonisamide.  Pharmacotherapy (Medications Ordered): No orders of the defined types were placed in this encounter.  Orders:  Orders Placed This Encounter  Procedures   MR HIP RIGHT WO CONTRAST    Standing Status:   Future    Expiration Date:   03/11/2024    Scheduling Instructions:     Please make sure that the patient understands that this needs to be done as soon as possible. Never have the patient do the imaging just before the next appointment.  Inform patient that having the imaging done within the [name] will expedite the availability of the results and will provide      imaging availability to the requesting physician. In addition inform the patient that the imaging order has an expiration date and will not be renewed if not done within the active period.    What is the patient's sedation requirement?:   No Sedation    Does the patient have a pacemaker or implanted devices?:   No    Preferred imaging location?:   [facility] (table limit-350lbs)    Call Results- Best Contact Number?:   [phone] [facility]    Radiology Contrast Protocol - do NOT remove file path:   [email or web address]     TPI- cervical, thoracic 08/19/23; bilateral L3, L4, L5 RFA 10/09/2023. Right IA hip steroid injection 11/11/23    Return for will contact PT after MRI results.    Recent Visits Date Type Provider Dept  11/11/23 Procedure visit Marcelino Nurse, MD [facility]  10/23/23 Office Visit Patel, Seema K, NP [facility]  10/09/23 Procedure visit Marcelino Nurse, MD [facility]  09/23/23 Office Visit Patel, Seema K, NP [facility]  Showing recent visits within past 90 days and meeting all other requirements Today's Visits Date Type Provider Dept  12/10/23 Office Visit Marcelino Nurse, MD [facility]  Showing today's visits and meeting all other requirements Future Appointments  Date Type Provider Dept  01/20/24 Appointment Patel, Seema K, NP [facility]  Showing future appointments within next 90 days and meeting all other requirements  I discussed the assessment and treatment plan with the patient. The patient was provided an opportunity to ask questions and all were answered. The patient agreed with the plan and demonstrated an understanding of the instructions.  Patient advised to call back or seek  an in-person evaluation if the symptoms or condition worsens.  I personally spent a total of 20 minutes in the care of the patient today including preparing to see the patient, getting/reviewing separately obtained history, performing a medically appropriate exam/evaluation, counseling and educating, placing orders, and documenting clinical information in the EHR.   Note by: Wallie Sherry, MD (TTS and AI technology used. I apologize for any typographical errors that were not detected and corrected.) Date: 12/10/2023; Time: 9:36 AM

## 2023-12-14 ENCOUNTER — Other Ambulatory Visit: Payer: Self-pay | Admitting: Family Medicine

## 2023-12-14 DIAGNOSIS — J4530 Mild persistent asthma, uncomplicated: Secondary | ICD-10-CM

## 2023-12-16 ENCOUNTER — Telehealth: Payer: Self-pay | Admitting: *Deleted

## 2023-12-16 ENCOUNTER — Ambulatory Visit: Payer: PRIVATE HEALTH INSURANCE | Attending: Cardiology | Admitting: Cardiology

## 2023-12-16 ENCOUNTER — Other Ambulatory Visit: Payer: Self-pay | Admitting: Nurse Practitioner

## 2023-12-16 ENCOUNTER — Telehealth: Payer: Self-pay

## 2023-12-16 ENCOUNTER — Encounter: Payer: Self-pay | Admitting: Cardiology

## 2023-12-16 VITALS — BP 110/60 | HR 63 | Ht 61.0 in | Wt 214.8 lb

## 2023-12-16 DIAGNOSIS — I2510 Atherosclerotic heart disease of native coronary artery without angina pectoris: Secondary | ICD-10-CM

## 2023-12-16 DIAGNOSIS — E782 Mixed hyperlipidemia: Secondary | ICD-10-CM | POA: Diagnosis not present

## 2023-12-16 DIAGNOSIS — Z6841 Body Mass Index (BMI) 40.0 and over, adult: Secondary | ICD-10-CM | POA: Diagnosis not present

## 2023-12-16 MED ORDER — DIAZEPAM 5 MG PO TABS: 5.0000 mg | ORAL_TABLET | ORAL | 0 refills | Status: AC

## 2023-12-16 NOTE — Progress Notes (Signed)
 Cardiology Office Note:    Date:  12/16/2023   ID:  CALLEY DRENNING, DOB 02-Feb-1971, MRN [id number]  PCP:  Sowles, Krichna, MD  [facility] Cardiologist:  Redell Cave, MD  [facility] Electrophysiologist:  None   Referring MD: Sowles, Krichna, MD   Chief Complaint  Patient presents with   Follow-up    12 month follow up pt has  complaints of chest pain a few weeks ago , no  chest pressure or SOB, medciation reviewed verbally with patient    History of Present Illness:    Claire [name] is a 53 y.o. female with a hx of CAD (non obstructive 25%LAD), diabetes, depression, hyperlipidemia, family history of CAD who presents for follow-up.    Feels well, denies chest pain or shortness of breath.  Did not get cholesterol blood work a month ago due to COVID infection.  Tolerating current dose of Lipitor as prescribed.  Denies chest pain.   Prior notes  echo 07/2019 showed normal systolic and diastolic function, EF 60 to 65%.  Coronary CTA 08/2019 calcium  score 175, mild LAD disease 25%.   Past Medical History:  Diagnosis Date   Allergy    All on file   Anemia    Anxiety    Arthritis    hands, hips   Asthma    Bilateral leg cramps    Depression    Diabetes mellitus without complication (HCC)    history no longer a problem since weight loss surgery   DJD (degenerative joint disease)    Dyspnea on exertion    a. 07/2019 Echo: EF 60-65%, no rwma, nl RV fxn, triv MR.   GERD (gastroesophageal reflux disease)    Headache    Migraines   IBS (irritable bowel syndrome)    Kidney stone    Low serum vitamin B12    Lung nodules    Migraine    down to approx 4x/mo since starting meds   Muscle spasm    Nonobstructive CAD (coronary artery disease)    a. 06/2014 ETT: Ex time 8:59, max HR 155, 10.1 METS, no ST/T changes; b. 08/2019 Cor CTA: Ca2+ 175 (99th %'ile), LAD 35-49p, LCX 0-59m-->Med rx.   Oxygen deficiency [phone]   Due to COVID   Psoriasis    vaginal area    Seasonal allergies    Sleep apnea    history no longer a problem since weight loss surgery   Vasovagal syncope    [place], Dr. Ferdinand    Vitamin D  deficiency     Past Surgical History:  Procedure Laterality Date   BLADDER SURGERY  2010   BREAST BIOPSY Right 2014   stereotatic biopsy   CHOLECYSTECTOMY  2010   COLONOSCOPY  2014   Done at [facility]   COLONOSCOPY WITH PROPOFOL  N/A 01/28/2018   Procedure: COLONOSCOPY WITH PROPOFOL ;  Surgeon: Janalyn Keene NOVAK, MD;  Location: [facility];  Service: Endoscopy;  Laterality: N/A;   DILITATION & CURRETTAGE/HYSTROSCOPY WITH NOVASURE ABLATION N/A 06/16/2015   Procedure: DILATATION & CURETTAGE/HYSTEROSCOPY WITH NOVASURE ABLATION;  Surgeon: Charlie Flowers, MD;  Location: [facility];  Service: Gynecology;  Laterality: N/A;   ESOPHAGOGASTRODUODENOSCOPY N/A 10/24/2023   Procedure: EGD (ESOPHAGOGASTRODUODENOSCOPY);  Surgeon: Therisa Bi, MD;  Location: [facility];  Service: Gastroenterology;  Laterality: N/A;   ESOPHAGOGASTRODUODENOSCOPY (EGD) WITH PROPOFOL  N/A 01/28/2018   Procedure: ESOPHAGOGASTRODUODENOSCOPY (EGD) WITH BIOPSIES;  Surgeon: Janalyn Keene NOVAK, MD;  Location: [facility];  Service: Endoscopy;  Laterality: N/A;   GASTRIC ROUX-EN-Y  N/A 11/03/2019   Procedure: LAPAROSCOPIC 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 WITH UPPER ENDOSCOPY;  Surgeon: Gladis Cough, MD;  Location: [facility];  Service: General;  Laterality: N/A;   HERNIA REPAIR  2012   HIATAL HERNIA REPAIR N/A 11/03/2019   Procedure: HERNIA REPAIR HIATAL;  Surgeon: Gladis Cough, MD;  Location: [facility];  Service: General;  Laterality: N/A;   LAPAROSCOPIC GASTRIC SLEEVE RESECTION  2012   LAPAROSCOPY N/A 06/16/2015   Procedure: LAPAROSCOPY DIAGNOSTIC;  Surgeon: Charlie Flowers, MD;  Location: [facility];  Service: Gynecology;  Laterality: N/A;   LAPAROSCOPY N/A 04/01/2018   Procedure: LAPAROSCOPY DIAGNOSTIC ERAS PATHWAY ENTEROLYSIS,  CECOPEXY;  Surgeon: Gladis Cough, MD;  Location: [facility];  Service: General;  Laterality: N/A;   LITHOTRIPSY  12/11/2017   laser lithotripsy   LYSIS OF ADHESION N/A 06/16/2015   Procedure: LYSIS OF ADHESION;  Surgeon: Charlie Flowers, MD;  Location: [facility];  Service: Gynecology;  Laterality: N/A;   PLANTAR FASCIA SURGERY Bilateral    ROBOTIC ASSISTED SALPINGO OOPHERECTOMY Right 06/16/2015   Procedure: ROBOTIC ASSISTED SALPINGO OOPHORECTOMY, EXCISION OF RIGHT CUL DE SAC MASS;  Surgeon: Charlie Flowers, MD;  Location: [facility];  Service: Gynecology;  Laterality: Right;   TONSILLECTOMY     UPPER GI ENDOSCOPY      Current Medications: Current Meds  Medication Sig   aspirin  EC 81 MG tablet Take 1 tablet (81 mg total) by mouth daily. Swallow whole.   atorvastatin  (LIPITOR) 40 MG tablet TAKE 1 TABLET BY MOUTH EVERY DAY   benzonatate  (TESSALON ) 100 MG capsule Take 1-2 capsules (100-200 mg total) by mouth 3 (three) times daily as needed for cough.   budesonide -formoterol  (SYMBICORT ) 160-4.5 MCG/ACT inhaler INHALE 2 PUFFS INTO THE LUNGS TWICE A DAY   calcium  citrate-vitamin D  (CALCIUM  CITRATE CHEWY BITE) 500-500 MG-UNIT chewable tablet Chew 1 tablet by mouth 3 (three) times daily.   clobetasol cream (TEMOVATE) 0.05 % SMARTSIG:Sparingly Topical Twice Daily   cyanocobalamin  (VITAMIN B12) 1000 MCG/ML injection INJECT 1 ML INTO THE MUSCLE EVERY 14 DAYS.   cyclobenzaprine  (FLEXERIL ) 10 MG tablet Take 10 mg by mouth at bedtime as needed for muscle spasms.   fluconazole  (DIFLUCAN ) 150 MG tablet Take 1 tablet (150 mg total) by mouth every other day.   IRON-VITAMIN C PO Take 1 tablet by mouth daily.   ketoconazole  (NIZORAL ) 2 % cream Apply 1 Application topically daily.   ketoconazole  (NIZORAL ) 2 % shampoo Apply 1 application topically 2 (two) times daily as needed (psoriasis).    Lactobacillus (ACIDOPHILUS PO) Take 1 capsule by mouth daily. Probiotic plus Calcium    mometasone  (ELOCON ) 0.1 % lotion Apply 1  application topically daily as needed (psoriasis in the ears).    mometasone  (NASONEX ) 50 MCG/ACT nasal spray PLACE 2 SPRAYS INTO THE NOSE DAILY.   montelukast  (SINGULAIR ) 10 MG tablet TAKE 1 TABLET (10 MG TOTAL) BY MOUTH AT BEDTIME. TAKE 1 TABLET(10 MG) BY MOUTH AT BEDTIME   Multiple Vitamins-Minerals (MULTIVITAMIN ADULT) CHEW Chew 1 tablet by mouth daily.   naltrexone  (DEPADE) 50 MG tablet Take 1 tablet (50 mg total) by mouth daily.   nystatin  (MYCOSTATIN /NYSTOP ) powder Apply 1 Application topically 3 (three) times daily.   ondansetron  (ZOFRAN ) 4 MG tablet Take 4 mg by mouth every 8 (eight) hours as needed.   pantoprazole  (PROTONIX ) 40 MG tablet Take 40 mg by mouth daily.   pregabalin  (LYRICA ) 300 MG capsule Take 1 capsule (300 mg total) by mouth 2 (two) times daily.  Semaglutide , 2 MG/DOSE, (OZEMPIC , 2 MG/DOSE,) 8 MG/3ML SOPN Inject 2 mg into the skin once a week.   traZODone  (DESYREL ) 100 MG tablet Take 2 tablets (200 mg total) by mouth at bedtime.   triamcinolone  cream (KENALOG ) 0.1 % Apply 1 application topically 2 (two) times daily.   TRINTELLIX  20 MG TABS tablet Take 1 tablet (20 mg total) by mouth daily.   Vitamin D , Ergocalciferol , (DRISDOL ) 1.25 MG (50000 UNIT) CAPS capsule Take 1 capsule (50,000 Units total) by mouth every Saturday.   zonisamide (ZONEGRAN) 50 MG capsule Take 150 mg by mouth daily.      Allergies:   Aspartame and phenylalanine, Gabapentin, Glucose, Linzess  [linaclotide ], Other, Triamcinolone , Cymbalta  [duloxetine  hcl], Duloxetine , Tape, and Tapentadol   Social History   Socioeconomic History   Marital status: Married    Spouse name: Karleen    Number of children: 3   Years of education: Not on file   Highest education level: Some college, no degree  Occupational History   Occupation: unemployed   Tobacco Use   Smoking status: Former    Current packs/day: 0.00    Average packs/day: 1 pack/day for 10.0 years (10.0 ttl pk-yrs)    Types: Cigarettes    Start  date: 02/20/1983    Quit date: 02/19/1993    Years since quitting: 30.8   Smokeless tobacco: Never  Vaping Use   Vaping status: Never Used  Substance and Sexual Activity   Alcohol use: Yes    Alcohol/week: 0.0 standard drinks of alcohol    Comment: occasionally   Drug use: No   Sexual activity: Yes    Partners: Male    Comment: last sex 04 Jan 2018  Other Topics Concern   Not on file  Social History Narrative   Not working since September 2021 - 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, 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    Social Drivers of Corporate Investment Banker Strain: Medium Risk (11/04/2023)   Overall Financial Resource Strain (CARDIA)    Difficulty of Paying Living Expenses: Somewhat hard  Food Insecurity: Food Insecurity Present (11/04/2023)   Hunger Vital Sign    Worried About Running Out of Food in the Last Year: Sometimes true    Ran Out of Food in the Last Year: Never true  Transportation Needs: No Transportation Needs (11/04/2023)   PRAPARE - Administrator, Civil Service (Medical): No    Lack of Transportation (Non-Medical): No  Physical Activity: Unknown (11/04/2023)   Exercise Vital Sign    Days of Exercise per Week: Patient declined    Minutes of Exercise per Session: Not on file  Stress: No Stress Concern Present (11/04/2023)   [employer] of Occupational Health - Occupational Stress Questionnaire    Feeling of Stress: Only a little  Social Connections: Moderately Isolated (11/04/2023)   Social Connection and Isolation Panel    Frequency of Communication with Friends and Family: Twice a week    Frequency of Social Gatherings with Friends and Family: Once a week    Attends Religious Services: Patient declined    Database Administrator or Organizations: No    Attends Engineer, Structural: Not on file    Marital Status: Married     Family History: The patient's family history includes ADD / ADHD in her daughter,  sister, son, and son; Anxiety disorder in her mother; Arthritis in her mother; Asthma in her father; Breast cancer in her paternal aunt  and paternal grandmother; COPD in her father; Depression in her mother; Diabetes in her father, maternal grandmother, and mother; Early death in her sister; Heart attack in her maternal uncle, mother, paternal grandfather, and sister; Heart disease in her father and mother; Hyperlipidemia in her father; Lung cancer in her father and maternal grandfather; Multiple myeloma in her mother; Stroke in her mother.  ROS:   Please see the history of present illness.     All other systems reviewed and are negative.  EKGs/Labs/Other Studies Reviewed:    The following studies were reviewed today:   EKG Interpretation Date/Time:  Monday December 16 2023 09:09:21 EDT Ventricular Rate:  63 PR Interval:  150 QRS Duration:  84 QT Interval:  450 QTC Calculation: 460 R Axis:   2  Text Interpretation: Normal sinus rhythm Minimal voltage criteria for LVH, may be normal variant ( R in aVL ) Confirmed by Darliss Rogue ([id number]) on 12/16/2023 9:16:48 AM    Recent Labs: 01/11/2023: ALT 77; BUN 14; Creat 0.68; Hemoglobin 15.6; Platelets 267; Potassium 4.0; Sodium 135  Recent Lipid Panel    Component Value Date/Time   CHOL 160 01/11/2023 0825   CHOL 179 12/31/2018 0857   TRIG 80 01/11/2023 0825   HDL 73 01/11/2023 0825   HDL 64 12/31/2018 0857   CHOLHDL 2.2 01/11/2023 0825   VLDL 20 11/11/2015 0846   LDLCALC 71 01/11/2023 0825    Physical Exam:    VS:  BP 110/60 (BP Location: Left Arm, Patient Position: Sitting, Cuff Size: Normal)   Pulse 63   Ht 5' 1 (1.549 m)   Wt 214 lb 12.8 oz (97.4 kg)   SpO2 97%   BMI 40.59 kg/m     Wt Readings from Last 3 Encounters:  12/16/23 214 lb 12.8 oz (97.4 kg)  12/10/23 212 lb (96.2 kg)  11/11/23 217 lb (98.4 kg)     GEN:  Well nourished, well developed in no acute distress HEENT: Normal NECK: No JVD; No carotid  bruits CARDIAC: RRR, no murmurs, rubs, gallops RESPIRATORY:  Clear to auscultation without rales, wheezing or rhonchi  ABDOMEN: Soft, non-tender, non-distended MUSCULOSKELETAL:  No edema; right chest wall tenderness with palpation. SKIN: Warm and dry NEUROLOGIC:  Alert and oriented x 3 PSYCHIATRIC:  Normal affect   ASSESSMENT:    1. Coronary artery disease involving native coronary artery of native heart without angina pectoris   2. Mixed hyperlipidemia   3. BMI 40.0-44.9, adult [facility])    PLAN:    In order of problems listed above:  Nonobstructive CAD, coronary CT 9/24 shows mild nonobstructive LAD stenosis.   Echo 9/24 with normal EF 60 to 65%.  Continue aspirin  81 mg daily, Lipitor 40 mg daily.   LDL goal less than 70, continue Lipitor 40 mg daily.  Plans to obtain lipid panel with primary care physician.  Titrate Lipitor if cholesterol not adequately controlled. Morbid obesity, low-calorie diet, weight loss advised.  Follow-up yearly.   Medication Adjustments/Labs and Tests Ordered: Current medicines are reviewed at length with the patient today.  Concerns regarding medicines are outlined above.  Orders Placed This Encounter  Procedures   EKG 12-Lead   No orders of the defined types were placed in this encounter.   Patient Instructions  Medication Instructions:  Your physician recommends that you continue on your current medications as directed. Please refer to the Current Medication list given to you today.   *If you need a refill on your cardiac medications before your next  appointment, please call your pharmacy*  Lab Work: No labs ordered today  If you have labs (blood work) drawn today and your tests are completely normal, you will receive your results only by: MyChart Message (if you have MyChart) OR A paper copy in the mail If you have any lab test that is abnormal or we need to change your treatment, we will call you to review the  results.  Testing/Procedures: No test ordered today   Follow-Up: At [facility], you and your health needs are our priority.  As part of our continuing mission to provide you with exceptional heart care, our providers are all part of one team.  This team includes your primary Cardiologist (physician) and Advanced Practice Providers or APPs (Physician Assistants and Nurse Practitioners) who all work together to provide you with the care you need, when you need it.  Your next appointment:   1 year(s)  Provider:   You may see Redell Cave, MD or one of the following Advanced Practice Providers on your designated Care Team:   Lonni Meager, NP Lesley Maffucci, PA-C Bernardino Bring, PA-C Cadence Bolivar, PA-C Tylene Lunch, NP Barnie Hila, NP    We recommend signing up for the patient portal called MyChart.  Sign up information is provided on this After Visit Summary.  MyChart is used to connect with patients for Virtual Visits (Telemedicine).  Patients are able to view lab/test results, encounter notes, upcoming appointments, etc.  Non-urgent messages can be sent to your provider as well.   To learn more about what you can do with MyChart, go to [email or web address].              Signed, Redell Cave, MD  12/16/2023 9:38 AM    [facility]

## 2023-12-16 NOTE — Telephone Encounter (Signed)
 She has an MRI in the morning and wants a valium  called out. Please let her know when its sent.

## 2023-12-16 NOTE — Patient Instructions (Signed)

## 2023-12-16 NOTE — Telephone Encounter (Signed)
 Message sent to Seema

## 2023-12-17 ENCOUNTER — Ambulatory Visit
Admission: RE | Admit: 2023-12-17 | Discharge: 2023-12-17 | Disposition: A | Discharge status: Home / Self Care | Payer: PRIVATE HEALTH INSURANCE | Source: Ambulatory Visit | Attending: Student in an Organized Health Care Education/Training Program | Admitting: Student in an Organized Health Care Education/Training Program

## 2023-12-17 DIAGNOSIS — G8929 Other chronic pain: Secondary | ICD-10-CM | POA: Diagnosis present

## 2023-12-17 DIAGNOSIS — M1611 Unilateral primary osteoarthritis, right hip: Secondary | ICD-10-CM | POA: Insufficient documentation

## 2023-12-17 DIAGNOSIS — M25551 Pain in right hip: Secondary | ICD-10-CM | POA: Insufficient documentation

## 2023-12-19 ENCOUNTER — Other Ambulatory Visit (HOSPITAL_COMMUNITY)
Admission: RE | Admit: 2023-12-19 | Discharge: 2023-12-19 | Disposition: A | Discharge status: Home / Self Care | Payer: PRIVATE HEALTH INSURANCE | Source: Ambulatory Visit | Attending: Family Medicine | Admitting: Family Medicine

## 2023-12-19 ENCOUNTER — Ambulatory Visit (INDEPENDENT_AMBULATORY_CARE_PROVIDER_SITE_OTHER): Payer: PRIVATE HEALTH INSURANCE | Admitting: Family Medicine

## 2023-12-19 ENCOUNTER — Encounter: Payer: Self-pay | Admitting: Family Medicine

## 2023-12-19 VITALS — BP 126/70 | HR 74 | Resp 16 | Ht 61.0 in | Wt 212.0 lb

## 2023-12-19 DIAGNOSIS — E785 Hyperlipidemia, unspecified: Secondary | ICD-10-CM

## 2023-12-19 DIAGNOSIS — Z1211 Encounter for screening for malignant neoplasm of colon: Secondary | ICD-10-CM

## 2023-12-19 DIAGNOSIS — Z124 Encounter for screening for malignant neoplasm of cervix: Secondary | ICD-10-CM

## 2023-12-19 DIAGNOSIS — Z23 Encounter for immunization: Secondary | ICD-10-CM

## 2023-12-19 DIAGNOSIS — G43901 Migraine, unspecified, not intractable, with status migrainosus: Secondary | ICD-10-CM

## 2023-12-19 DIAGNOSIS — E1169 Type 2 diabetes mellitus with other specified complication: Secondary | ICD-10-CM

## 2023-12-19 DIAGNOSIS — Z0001 Encounter for general adult medical examination with abnormal findings: Secondary | ICD-10-CM | POA: Diagnosis not present

## 2023-12-19 DIAGNOSIS — E538 Deficiency of other specified B group vitamins: Secondary | ICD-10-CM

## 2023-12-19 DIAGNOSIS — Z7985 Long-term (current) use of injectable non-insulin antidiabetic drugs: Secondary | ICD-10-CM

## 2023-12-19 DIAGNOSIS — L409 Psoriasis, unspecified: Secondary | ICD-10-CM

## 2023-12-19 DIAGNOSIS — Z0000 Encounter for general adult medical examination without abnormal findings: Secondary | ICD-10-CM

## 2023-12-19 DIAGNOSIS — E559 Vitamin D deficiency, unspecified: Secondary | ICD-10-CM

## 2023-12-19 DIAGNOSIS — Z6841 Body Mass Index (BMI) 40.0 and over, adult: Secondary | ICD-10-CM

## 2023-12-19 MED ORDER — TRIAMCINOLONE ACETONIDE 0.1 % EX CREA: 1.0000 | TOPICAL_CREAM | Freq: Two times a day (BID) | CUTANEOUS | 0 refills | Status: AC

## 2023-12-19 NOTE — Progress Notes (Signed)
 Name: Claire [name]   MRN: [id number]    DOB: 13-Aug-1970   Date:12/19/2023       Progress Note  Subjective  Chief Complaint  Chief Complaint  Patient presents with   Annual Exam   Headache    HPI  Patient presents for annual CPE and follow up  Discussed the use of AI scribe software for clinical note transcription with the patient, who gave verbal consent to proceed.  History of Present Illness Claire [name] is a 53 year old female with migraines and diabetes who presents for a follow-up visit and management of her migraines. She is accompanied by her husband.  She experiences persistent migraines that have not responded to her current medication regimen. She takes zonisamide 50 mg and uses Zofran  as needed, along with Flexeril  as a muscle relaxer. Her migraines are severe, lasting three to four days, and unresponsive to current treatments, impacting her daily activities and sleep. No aura is associated with her migraines. She has not tried medications like Emgality or Nurtec, and her neurologist has not prescribed these yet.  She has a history of diabetes and is currently on Ozempic  for management. She had an A1c checked in March 2025. No symptoms of hyperglycemia such as increased hunger, thirst, or urination.  She experiences chronic pain due to fibromyalgia and neuropathy, for which she takes pregabalin . She also has carpal tunnel syndrome and sees a neurologist for these conditions.  She has psoriasis affecting multiple areas including her scalp, ears, and a spot on her buttock. She has used clobetasol in the past for management. She also reports a history of folliculitis.  She has asthma and uses Symbicort  as needed. She experiences breathing difficulties and currently uses Symbicort  as needed.  She has a history of B12 deficiency and receives B12 injections approximately every two weeks. She is also due for a vitamin D  level check.  She reports urinary incontinence,  particularly when coughing or sneezing, and has previously seen a urologist for this issue.     Diet: balanced diet  Exercise: not current due to chronic pain  Last Eye Exam: completed Last Dental Exam: completed  Flowsheet Row Office Visit from 12/19/2023 in [facility]  AUDIT-C Score 2    Depression: Phq 9 is  negative    12/19/2023    9:57 AM 12/10/2023    8:39 AM 11/11/2023   12:04 PM 11/06/2023   11:31 AM 10/09/2023   12:57 PM  Depression screen PHQ 2/9  Decreased Interest 0 0 0 0 0  Down, Depressed, Hopeless 0 0 0 0 0  PHQ - 2 Score 0 0 0 0 0   Hypertension: BP Readings from Last 3 Encounters:  12/19/23 126/70  12/16/23 110/60  12/10/23 136/73   Obesity: Wt Readings from Last 3 Encounters:  12/19/23 212 lb (96.2 kg)  12/16/23 214 lb 12.8 oz (97.4 kg)  12/10/23 212 lb (96.2 kg)   BMI Readings from Last 3 Encounters:  12/19/23 40.06 kg/m  12/16/23 40.59 kg/m  12/10/23 40.06 kg/m     Vaccines: reviewed with the patient.   Hep C Screening: completed STD testing and prevention (HIV/chl/gon/syphilis): Not interested  Intimate partner violence: negative screen  Sexual History : not currently sexually active in the past 6 months due to back pain  Menstrual History/LMP/Abnormal Bleeding: s/p ablation, still has cycles monthly but very light  Discussed importance of follow up if any post-menopausal bleeding: not applicable  Incontinence Symptoms: positive for  symptoms - she has been doing kegel exercises , due for follow up with Urologist   Breast cancer:  - Last Mammogram: already scheduled  - BRCA gene screening: negative done 03/2023  Osteoporosis Prevention : Discussed high calcium  and vitamin D  supplementation, weight bearing exercises Bone density :not applicable   Cervical cancer screening: performing today  Skin cancer: Discussed monitoring for atypical lesions  Colorectal cancer: she is not able to get it done due to  insurance, post poned to 7 years since last colonoscopy was normal    Lung cancer:  Low Dose CT Chest recommended if Age 55-80 years, 20 pack-year currently smoking OR have quit w/in 15years. Patient does not qualify for screen   ECG: 12/16/2023  Advanced Care Planning: A voluntary discussion about advance care planning including the explanation and discussion of advance directives.  Discussed health care proxy and Living will, and the patient was able to identify a health care proxy as husband .  Patient does not have a living will and power of attorney of health care   Patient Active Problem List   Diagnosis Date Noted   Primary osteoarthritis of right hip 12/10/2023   Chronic right hip pain 10/23/2023   Spondylosis of lumbar region without myelopathy or radiculopathy 09/23/2023   Myofascial pain syndrome 08/12/2023   Chronic pain syndrome 08/12/2023   Right shoulder pain 08/12/2023   Spondylolisthesis of lumbar region 01/25/2023   Morbid obesity with BMI of 40.0-44.9, adult (HCC) 07/10/2021   ADD (attention deficit disorder) 07/10/2021   Type 2 diabetes mellitus with obesity 07/10/2021   History of thyroiditis 07/10/2021   Fibromyalgia 07/10/2021   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/03/2019   B12 deficiency 04/10/2018   Columnar epithelial-lined lower esophagus    Postprocedural disorder of digestive system    Esophageal dysphagia    Persistent mood (affective) disorder, unspecified 01/15/2018   Insomnia related to another mental disorder 01/15/2018   OCD (obsessive compulsive disorder) 10/29/2017   GAD (generalized anxiety disorder) 10/29/2017   Nephrolithiasis 10/22/2017   Atherosclerosis of aorta 12/21/2016   Hiatal hernia 08/17/2016   Diverticulosis of colon 08/17/2016   Lumbosacral pain 01/07/2015   Intertrigo 11/05/2014   Perennial allergic rhinitis 09/03/2014   Lung  nodules 09/03/2014   Vitamin D  deficiency 09/03/2014   History of kidney stones 09/03/2014   History of iron deficiency anemia 09/03/2014   Mild persistent asthma without complication 11/21/2012   GERD without esophagitis 11/21/2012   History of hyperlipidemia 11/21/2012   Migraine with aura and without status migrainosus 11/21/2012   History of sleep apnea 11/21/2012   IBS (irritable bowel syndrome) 08/15/2012    Past Surgical History:  Procedure Laterality Date   APPENDECTOMY     BLADDER SURGERY  2010   BREAST BIOPSY Right 2014   stereotatic biopsy   CHOLECYSTECTOMY  2010   COLONOSCOPY  2014   Done at [facility]   COLONOSCOPY WITH PROPOFOL  N/A 01/28/2018   Procedure: COLONOSCOPY WITH PROPOFOL ;  Surgeon: Janalyn Keene NOVAK, MD;  Location: [facility];  Service: Endoscopy;  Laterality: N/A;   DILITATION & CURRETTAGE/HYSTROSCOPY WITH NOVASURE ABLATION N/A 06/16/2015   Procedure: DILATATION & CURETTAGE/HYSTEROSCOPY WITH NOVASURE ABLATION;  Surgeon: Charlie Flowers, MD;  Location: [facility];  Service: Gynecology;  Laterality: N/A;   ESOPHAGOGASTRODUODENOSCOPY N/A 10/24/2023   Procedure: EGD (ESOPHAGOGASTRODUODENOSCOPY);  Surgeon: Therisa Bi, MD;  Location: [facility];  Service: Gastroenterology;  Laterality: N/A;   ESOPHAGOGASTRODUODENOSCOPY (EGD) WITH PROPOFOL  N/A 01/28/2018   Procedure: ESOPHAGOGASTRODUODENOSCOPY (EGD) WITH BIOPSIES;  Surgeon: Janalyn Keene NOVAK, MD;  Location: [facility];  Service: Endoscopy;  Laterality: N/A;   GASTRIC ROUX-EN-Y N/A 11/03/2019   Procedure: LAPAROSCOPIC 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 WITH UPPER ENDOSCOPY;  Surgeon: Gladis Cough, MD;  Location: [facility];  Service: General;  Laterality: N/A;   HERNIA REPAIR  2012   HIATAL HERNIA REPAIR N/A 11/03/2019   Procedure: HERNIA REPAIR HIATAL;  Surgeon: Gladis Cough, MD;  Location: [facility];  Service: General;  Laterality: N/A;   LAPAROSCOPIC GASTRIC  SLEEVE RESECTION  2012   LAPAROSCOPY N/A 06/16/2015   Procedure: LAPAROSCOPY DIAGNOSTIC;  Surgeon: Charlie Flowers, MD;  Location: [facility];  Service: Gynecology;  Laterality: N/A;   LAPAROSCOPY N/A 04/01/2018   Procedure: LAPAROSCOPY DIAGNOSTIC ERAS PATHWAY ENTEROLYSIS, CECOPEXY;  Surgeon: Gladis Cough, MD;  Location: [facility];  Service: General;  Laterality: N/A;   LITHOTRIPSY  12/11/2017   laser lithotripsy   LYSIS OF ADHESION N/A 06/16/2015   Procedure: LYSIS OF ADHESION;  Surgeon: Charlie Flowers, MD;  Location: [facility];  Service: Gynecology;  Laterality: N/A;   PLANTAR FASCIA SURGERY Bilateral    ROBOTIC ASSISTED SALPINGO OOPHERECTOMY Right 06/16/2015   Procedure: ROBOTIC ASSISTED SALPINGO OOPHORECTOMY, EXCISION OF RIGHT CUL DE SAC MASS;  Surgeon: Charlie Flowers, MD;  Location: [facility];  Service: Gynecology;  Laterality: Right;   TONSILLECTOMY     UPPER GI ENDOSCOPY      Family History  Problem Relation Age of Onset   Heart attack Mother    Stroke Mother    Multiple myeloma Mother    Anxiety disorder Mother    Arthritis Mother    Depression Mother    Diabetes Mother    Heart disease Mother    Hyperlipidemia Father    Lung cancer Father    Asthma Father    COPD Father    Diabetes Father    Heart disease Father    Cancer Father    Heart attack Sister    Lung cancer Maternal Grandfather    Breast cancer Paternal Grandmother    Heart attack Paternal Grandfather    Heart attack Maternal Uncle    Breast cancer Paternal Aunt    Diabetes Maternal Grandmother    ADD / ADHD Sister    Early death Sister    ADD / ADHD Daughter    ADD / ADHD Son    ADD / ADHD Son     Social History   Socioeconomic History   Marital status: Married    Spouse name: Karleen    Number of children: 3   Years of education: Not on file   Highest education level: Some college, no degree  Occupational History   Occupation: unemployed   Tobacco Use   Smoking status: Former    Current packs/day: 0.00     Average packs/day: 1 pack/day for 10.0 years (10.0 ttl pk-yrs)    Types: Cigarettes    Start date: 02/20/1983    Quit date: 02/19/1993    Years since quitting: 30.8   Smokeless tobacco: Never  Vaping Use   Vaping status: Never Used  Substance and Sexual Activity   Alcohol use: Yes    Alcohol/week: 2.0 standard drinks of alcohol    Types: 2 Glasses of wine per week    Comment: occasionally   Drug use: No   Sexual activity: Yes  Partners: Male    Birth control/protection: None    Comment: last sex 04 Jan 2018  Other Topics Concern   Not on file  Social History Narrative   Not working since September 2021 - 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, 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    Social Drivers of Corporate Investment Banker Strain: Low Risk  (12/19/2023)   Overall Financial Resource Strain (CARDIA)    Difficulty of Paying Living Expenses: Not hard at all  Recent Concern: Physicist, Medical Strain - Medium Risk (11/04/2023)   Overall Financial Resource Strain (CARDIA)    Difficulty of Paying Living Expenses: Somewhat hard  Food Insecurity: No Food Insecurity (12/19/2023)   Hunger Vital Sign    Worried About Running Out of Food in the Last Year: Never true    Ran Out of Food in the Last Year: Never true  Recent Concern: Food Insecurity - Food Insecurity Present (11/04/2023)   Hunger Vital Sign    Worried About Running Out of Food in the Last Year: Sometimes true    Ran Out of Food in the Last Year: Never true  Transportation Needs: No Transportation Needs (12/18/2023)   PRAPARE - Administrator, Civil Service (Medical): No    Lack of Transportation (Non-Medical): No  Physical Activity: Inactive (12/19/2023)   Exercise Vital Sign    Days of Exercise per Week: 0 days    Minutes of Exercise per Session: 0 min  Stress: No Stress Concern Present (12/18/2023)   [employer] of Occupational Health - Occupational Stress Questionnaire     Feeling of Stress: Not at all  Social Connections: Moderately Isolated (12/18/2023)   Social Connection and Isolation Panel    Frequency of Communication with Friends and Family: Three times a week    Frequency of Social Gatherings with Friends and Family: Once a week    Attends Religious Services: Patient declined    Database Administrator or Organizations: No    Attends Engineer, Structural: Not on file    Marital Status: Married  Catering Manager Violence: Not At Risk (12/19/2023)   Humiliation, Afraid, Rape, and Kick questionnaire    Fear of Current or Ex-Partner: No    Emotionally Abused: No    Physically Abused: No    Sexually Abused: No     Current Outpatient Medications:    aspirin  EC 81 MG tablet, Take 1 tablet (81 mg total) by mouth daily. Swallow whole., Disp: 90 tablet, Rfl: 3   atorvastatin  (LIPITOR) 40 MG tablet, TAKE 1 TABLET BY MOUTH EVERY DAY, Disp: 90 tablet, Rfl: 0   budesonide -formoterol  (SYMBICORT ) 160-4.5 MCG/ACT inhaler, INHALE 2 PUFFS INTO THE LUNGS TWICE A DAY, Disp: 10.2 each, Rfl: 2   calcium  citrate-vitamin D  (CALCIUM  CITRATE CHEWY BITE) 500-500 MG-UNIT chewable tablet, Chew 1 tablet by mouth 3 (three) times daily., Disp: , Rfl:    clobetasol cream (TEMOVATE) 0.05 %, SMARTSIG:Sparingly Topical Twice Daily, Disp: , Rfl:    cyanocobalamin  (VITAMIN B12) 1000 MCG/ML injection, INJECT 1 ML INTO THE MUSCLE EVERY 14 DAYS., Disp: 6 mL, Rfl: 0   cyclobenzaprine  (FLEXERIL ) 10 MG tablet, Take 10 mg by mouth at bedtime as needed for muscle spasms., Disp: , Rfl:    diazepam  (VALIUM ) 5 MG tablet, Take 1 tablet (5 mg total) by mouth 60 (sixty) minutes before procedure for 1 dose. 1 tab PO 60 min pre-MRI. If still anxious, take 2nd tab 15 min just before MRI.  Max: 2 tbs (10 mg). Avoid taking opioid pain medications within 4 hours of taking valium . Must have a driver. Do not drive or operate machinery x 24 hours after taking this medication., Disp: 2 tablet, Rfl: 0    IRON-VITAMIN C PO, Take 1 tablet by mouth daily., Disp: , Rfl:    ketoconazole  (NIZORAL ) 2 % cream, Apply 1 Application topically daily., Disp: 120 g, Rfl: 1   ketoconazole  (NIZORAL ) 2 % shampoo, Apply 1 application topically 2 (two) times daily as needed (psoriasis). , Disp: , Rfl:    Lactobacillus (ACIDOPHILUS PO), Take 1 capsule by mouth daily. 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 , Disp: , Rfl: 4   mometasone  (NASONEX ) 50 MCG/ACT nasal spray, PLACE 2 SPRAYS INTO THE NOSE DAILY., Disp: 51 each, Rfl: 1   montelukast  (SINGULAIR ) 10 MG tablet, TAKE 1 TABLET (10 MG TOTAL) BY MOUTH AT BEDTIME. TAKE 1 TABLET(10 MG) BY MOUTH AT BEDTIME, Disp: 90 tablet, Rfl: 0   Multiple Vitamins-Minerals (MULTIVITAMIN ADULT) CHEW, Chew 1 tablet by mouth daily., Disp: , Rfl:    naltrexone  (DEPADE) 50 MG tablet, Take 1 tablet (50 mg total) by mouth daily., Disp: 30 tablet, Rfl: 2   nystatin  (MYCOSTATIN /NYSTOP ) powder, Apply 1 Application topically 3 (three) times daily., Disp: 60 g, Rfl: 0   ondansetron  (ZOFRAN ) 4 MG tablet, Take 4 mg by mouth every 8 (eight) hours as needed., Disp: , Rfl:    pantoprazole  (PROTONIX ) 40 MG tablet, Take 40 mg by mouth daily., Disp: , Rfl:    pregabalin  (LYRICA ) 300 MG capsule, Take 1 capsule (300 mg total) by mouth 2 (two) times daily., Disp: 180 capsule, Rfl: 0   Semaglutide , 2 MG/DOSE, (OZEMPIC , 2 MG/DOSE,) 8 MG/3ML SOPN, Inject 2 mg into the skin once a week., Disp: 9 mL, Rfl: 0   traZODone  (DESYREL ) 100 MG tablet, Take 2 tablets (200 mg total) by mouth at bedtime., Disp: 180 tablet, Rfl: 0   triamcinolone  cream (KENALOG ) 0.1 %, Apply 1 application topically 2 (two) times daily., Disp: 453.6 g, Rfl: 0   TRINTELLIX  20 MG TABS tablet, Take 1 tablet (20 mg total) by mouth daily., Disp: 90 tablet, Rfl: 0   Vitamin D , Ergocalciferol , (DRISDOL ) 1.25 MG (50000 UNIT) CAPS capsule, Take 1  capsule (50,000 Units total) by mouth every Saturday., Disp: 12 capsule, Rfl: 1   zonisamide (ZONEGRAN) 50 MG capsule, Take 150 mg by mouth daily. , Disp: , Rfl: 2   benzonatate  (TESSALON ) 100 MG capsule, Take 1-2 capsules (100-200 mg total) by mouth 3 (three) times daily as needed for cough., Disp: 40 capsule, Rfl: 0   fluconazole  (DIFLUCAN ) 150 MG tablet, Take 1 tablet (150 mg total) by mouth every other day., Disp: 3 tablet, Rfl: 0  Allergies  Allergen Reactions   Aspartame And Phenylalanine Nausea Only and Other (See Comments)    GI upset    Gabapentin     Bladder incontinence at higher dose   Glucose Nausea Only and Nausea And Vomiting   Linzess  [Linaclotide ]     Worsening of diarrhea   Other Hives and Other (See Comments)    Ricotta Cheese: flushed and hot   Triamcinolone  Other (See Comments)   Cymbalta  [Duloxetine  Hcl] Palpitations    And photosensitivity   Duloxetine  Palpitations    duloxetine  hydrochloride   Tape Rash    Some clear tapes   Tapentadol Rash  Some clear tapes     ROS  Constitutional: Negative for fever or weight change.  Respiratory: Negative for cough and shortness of breath.   Cardiovascular: Negative for chest pain or palpitations.  Gastrointestinal: Negative for abdominal pain, no bowel changes.  Musculoskeletal: Negative for gait problem or joint swelling.  Skin: positive for rash on vulva area  Neurological: Negative for dizziness , positive for headache.  No other specific complaints in a complete review of systems (except as listed in HPI above).   Objective  Vitals:   12/19/23 0959  BP: 126/70  Pulse: 74  Resp: 16  SpO2: 99%  Weight: 212 lb (96.2 kg)  Height: 5' 1 (1.549 m)    Body mass index is 40.06 kg/m.  Physical Exam  Constitutional: Patient appears well-developed and well-nourished. No distress.  HENT: Head: Normocephalic and atraumatic. Ears: B TMs ok, no erythema or effusion; Nose: Nose normal. Mouth/Throat:  Oropharynx is clear and moist. No oropharyngeal exudate.  Eyes: Conjunctivae and EOM are normal. Pupils are equal, round, and reactive to light. No scleral icterus.  Neck: Normal range of motion. Neck supple. No JVD present. No thyromegaly present.  Cardiovascular: Normal rate, regular rhythm and normal heart sounds.  No murmur heard. No BLE edema. Pulmonary/Chest: Effort normal and breath sounds normal. No respiratory distress. Abdominal: Soft. Bowel sounds are normal, no distension. There is no tenderness. no masses Breast: no lumps or masses, no nipple discharge or rashes FEMALE GENITALIA:  External genitalia normal External urethra normal Vaginal vault normal without discharge or lesions Cervix normal without discharge or lesions Bimanual exam normal without masses RECTAL: not done  Musculoskeletal: Normal range of motion, no joint effusions. No gross deformities Neurological: he is alert and oriented to person, place, and time. No cranial nerve deficit. Coordination, balance, strength, speech and gait are normal.  Skin: facial erythema but did not see any active psoriatic plaques  Psychiatric: Patient has a normal mood and affect. behavior is normal. Judgment and thought content normal.      Assessment & Plan Adult Wellness Visit Routine adult wellness visit with focus on screenings and vaccinations. - Perform foot exam due to diabetes. - Perform Pap smear with HPV testing. - Discuss colon cancer screening options. - Discuss HIV screening preference. - Review mammogram scheduled for November 25.  Type 2 diabetes mellitus Well-controlled with Ozempic . No symptoms of hyperglycemia reported. - Continue Ozempic . - Order A1c, lipid panel, kidney function, and urine protein tests.  Dyslipidemia Associated with diabetes, managed with atorvastatin . - Continue atorvastatin . - Order lipid panel.  Morbid obesity Managed with lifestyle modifications and Ozempic . - Continue  lifestyle modifications. - Continue Ozempic .  Migraine Chronic migraines not well-controlled. Neurologist manages treatment. Current medications ineffective for prolonged headaches. - Provide Nurtec sample for acute migraine treatment.  Chronic pain (back and hip) Chronic pain impacting physical activity. Managed by pain specialist. - Discuss low-impact exercises like swimming.  Fibromyalgia Managed with pregabalin  for pain relief. - Continue pregabalin .  Psoriasis Lesions on scalp, ears, and buttock. Previously treated with clobetasol. - Prescribe clobetasol for psoriasis management.  Asthma Managed with Symbicort  inhaler, currently used as needed. Symptoms suggest daily use may be beneficial. - Advise daily use of Symbicort  inhaler.  Urinary incontinence Stress incontinence symptoms. Performs Kegel exercises. - Advise continuation of Kegel exercises. - Consider referral to urologist if symptoms persist.  Depression Managed with Trintellix , under care of psychiatrist. - Continue Trintellix .  Vitamin D  deficiency Monitoring required. - Order vitamin D  level test.  Vitamin B12 deficiency Managed with B12 injections. Last injection was two weeks ago. - Order B12 level test.  Screening for malignant neoplasm of colon Due for colon cancer screening. Previous insurance denial due to prior normal results. - Attempt to reschedule colonoscopy with new insurance in the new year.  Screening for malignant neoplasm of cervix Due for cervical cancer screening. - Perform Pap smear with HPV testing.  Immunization management Immunizations up to date with recent flu and pneumonia vaccines.  General Health Maintenance Routine screenings and preventative health measures discussed. Eye and dental exams are up to date.  Goals of Care No advanced directives or living will currently in place.      -USPSTF grade A and B recommendations reviewed with patient; age-appropriate  recommendations, preventive care, screening tests, etc discussed and encouraged; healthy living encouraged; see AVS for patient education given to patient -Discussed importance of 150 minutes of physical activity weekly, eat two servings of fish weekly, eat one serving of tree nuts ( cashews, pistachios, pecans, almonds.SABRA) every other day, eat 6 servings of fruit/vegetables daily and drink plenty of water  and avoid sweet beverages.   -Reviewed Health Maintenance: Yes.

## 2023-12-20 LAB — CBC WITH DIFFERENTIAL/PLATELET
Absolute Lymphocytes: 1607 {cells}/uL (ref 850–3900)
Absolute Monocytes: 578 {cells}/uL (ref 200–950)
Basophils Absolute: 78 {cells}/uL (ref 0–200)
Basophils Relative: 0.8 %
Eosinophils Absolute: 323 {cells}/uL (ref 15–500)
Eosinophils Relative: 3.3 %
HCT: 49.3 % — ABNORMAL HIGH (ref 35.0–45.0)
Hemoglobin: 16.3 g/dL — ABNORMAL HIGH (ref 11.7–15.5)
MCH: 30.7 pg (ref 27.0–33.0)
MCHC: 33.1 g/dL (ref 32.0–36.0)
MCV: 92.8 fL (ref 80.0–100.0)
MPV: 11.8 fL (ref 7.5–12.5)
Monocytes Relative: 5.9 %
Neutro Abs: 7213 {cells}/uL (ref 1500–7800)
Neutrophils Relative %: 73.6 %
Platelets: 212 Thousand/uL (ref 140–400)
RBC: 5.31 Million/uL — ABNORMAL HIGH (ref 3.80–5.10)
RDW: 12.7 % (ref 11.0–15.0)
Total Lymphocyte: 16.4 %
WBC: 9.8 Thousand/uL (ref 3.8–10.8)

## 2023-12-20 LAB — COMPREHENSIVE METABOLIC PANEL WITH GFR
AG Ratio: 1.5 (calc) (ref 1.0–2.5)
ALT: 60 U/L — ABNORMAL HIGH (ref 6–29)
AST: 52 U/L — ABNORMAL HIGH (ref 10–35)
Albumin: 4 g/dL (ref 3.6–5.1)
Alkaline phosphatase (APISO): 104 U/L (ref 37–153)
BUN: 8 mg/dL (ref 7–25)
CO2: 27 mmol/L (ref 20–32)
Calcium: 9.1 mg/dL (ref 8.6–10.4)
Chloride: 99 mmol/L (ref 98–110)
Creat: 0.65 mg/dL (ref 0.50–1.03)
Globulin: 2.6 g/dL (ref 1.9–3.7)
Glucose, Bld: 258 mg/dL — ABNORMAL HIGH (ref 65–99)
Potassium: 4.2 mmol/L (ref 3.5–5.3)
Sodium: 134 mmol/L — ABNORMAL LOW (ref 135–146)
Total Bilirubin: 0.7 mg/dL (ref 0.2–1.2)
Total Protein: 6.6 g/dL (ref 6.1–8.1)
eGFR: 107 mL/min/1.73m2 (ref >=60)

## 2023-12-20 LAB — MICROALBUMIN / CREATININE URINE RATIO
Creatinine, Urine: 126 mg/dL (ref 20–275)
Microalb Creat Ratio: 11 mg/g{creat} (ref <30)
Microalb, Ur: 1.4 mg/dL

## 2023-12-20 LAB — LIPID PANEL
Cholesterol: 159 mg/dL (ref <200)
HDL: 62 mg/dL (ref >=50)
LDL Cholesterol (Calc): 75 mg/dL
Non-HDL Cholesterol (Calc): 97 mg/dL (ref <130)
Total CHOL/HDL Ratio: 2.6 (calc) (ref <5.0)
Triglycerides: 137 mg/dL (ref <150)

## 2023-12-20 LAB — B12 AND FOLATE PANEL
Folate: 12.4 ng/mL
Vitamin B-12: 373 pg/mL (ref 200–1100)

## 2023-12-20 LAB — VITAMIN D 25 HYDROXY (VIT D DEFICIENCY, FRACTURES): Vit D, 25-Hydroxy: 28 ng/mL — ABNORMAL LOW (ref 30–100)

## 2023-12-20 LAB — HEMOGLOBIN A1C
Hgb A1c MFr Bld: 10 % — ABNORMAL HIGH (ref <5.7)
Mean Plasma Glucose: 240 mg/dL
eAG (mmol/L): 13.3 mmol/L

## 2023-12-23 LAB — CYTOLOGY - PAP
Comment: NEGATIVE
Diagnosis: UNDETERMINED — AB
High risk HPV: NEGATIVE

## 2023-12-26 ENCOUNTER — Ambulatory Visit: Payer: Self-pay | Admitting: Family Medicine

## 2023-12-27 ENCOUNTER — Other Ambulatory Visit: Payer: Self-pay | Admitting: Family Medicine

## 2023-12-27 DIAGNOSIS — R748 Abnormal levels of other serum enzymes: Secondary | ICD-10-CM

## 2024-01-08 ENCOUNTER — Encounter: Payer: Self-pay | Admitting: Student in an Organized Health Care Education/Training Program

## 2024-01-14 ENCOUNTER — Ambulatory Visit
Admission: RE | Admit: 2024-01-14 | Discharge: 2024-01-14 | Disposition: A | Discharge status: Home / Self Care | Payer: PRIVATE HEALTH INSURANCE | Source: Ambulatory Visit | Attending: Family Medicine | Admitting: Family Medicine

## 2024-01-14 DIAGNOSIS — Z1231 Encounter for screening mammogram for malignant neoplasm of breast: Secondary | ICD-10-CM | POA: Diagnosis present

## 2024-01-19 ENCOUNTER — Other Ambulatory Visit: Payer: Self-pay | Admitting: Nurse Practitioner

## 2024-01-20 ENCOUNTER — Encounter: Payer: Self-pay | Admitting: Nurse Practitioner

## 2024-01-20 ENCOUNTER — Ambulatory Visit: Payer: PRIVATE HEALTH INSURANCE | Attending: Nurse Practitioner | Admitting: Nurse Practitioner

## 2024-01-20 VITALS — BP 142/76 | HR 82 | Temp 99.0°F | Resp 18 | Ht 61.0 in | Wt 211.0 lb

## 2024-01-20 DIAGNOSIS — M47816 Spondylosis without myelopathy or radiculopathy, lumbar region: Secondary | ICD-10-CM | POA: Diagnosis present

## 2024-01-20 DIAGNOSIS — M7918 Myalgia, other site: Secondary | ICD-10-CM | POA: Insufficient documentation

## 2024-01-20 DIAGNOSIS — M25552 Pain in left hip: Secondary | ICD-10-CM | POA: Insufficient documentation

## 2024-01-20 DIAGNOSIS — M545 Low back pain, unspecified: Secondary | ICD-10-CM | POA: Insufficient documentation

## 2024-01-20 DIAGNOSIS — G8929 Other chronic pain: Secondary | ICD-10-CM | POA: Insufficient documentation

## 2024-01-20 DIAGNOSIS — M25551 Pain in right hip: Secondary | ICD-10-CM | POA: Insufficient documentation

## 2024-01-20 DIAGNOSIS — M4316 Spondylolisthesis, lumbar region: Secondary | ICD-10-CM | POA: Diagnosis present

## 2024-01-20 DIAGNOSIS — G894 Chronic pain syndrome: Secondary | ICD-10-CM | POA: Diagnosis present

## 2024-01-20 DIAGNOSIS — M1611 Unilateral primary osteoarthritis, right hip: Secondary | ICD-10-CM | POA: Insufficient documentation

## 2024-01-20 DIAGNOSIS — M797 Fibromyalgia: Secondary | ICD-10-CM | POA: Insufficient documentation

## 2024-01-20 MED ORDER — TRAMADOL HCL 50 MG PO TABS: 50.0000 mg | ORAL_TABLET | Freq: Two times a day (BID) | ORAL | 0 refills | Status: DC | PRN

## 2024-01-20 NOTE — Progress Notes (Signed)
 Safety precautions to be maintained throughout the outpatient stay will include: orient to surroundings, keep bed in low position, maintain call bell within reach at all times, provide assistance with transfer out of bed and ambulation.

## 2024-01-20 NOTE — Progress Notes (Signed)
 PROVIDER NOTE: Interpretation of information contained herein should be left to medically-trained personnel. Specific patient instructions are provided elsewhere under Patient Instructions section of medical record. This document was created in part using AI and STT-dictation technology, any transcriptional errors that may result from this process are unintentional.  Patient: Claire [name]  Service: E/M   PCP: Sowles, Krichna, MD  DOB: 10-25-1970  DOS: 01/20/2024  Provider: Emmy MARLA Blanch, NP  MRN: [id number]  Delivery: Face-to-face  Specialty: Interventional Pain Management  Type: Established Patient  Setting: Ambulatory outpatient facility  Specialty designation: 09  Referring Prov.: Sowles, Krichna, MD  Location: Outpatient office facility       History of present illness (HPI) Ms. KARY SUGRUE, a 53 y.o. year old female, is here today because of her Chronic right hip pain [M25.551, G89.29]. Ms. Angelini primary complain today is Hip Pain (Right Hip )  Pertinent problems: Ms. Badley has GERD without esophagitis; Migraine with aura and without status migrainosus; History of sleep apnea; Lung nodules; Vitamin D  deficiency; Lumbosacral pain; GAD (generalized anxiety disorder); Morbid obesity with BMI of 40.0-44.9, adult (HCC); Type 2 diabetes mellitus with obesity; History of thyroiditis; Fibromyalgia; Spondylolisthesis of lumbar region; Myofascial pain syndrome; Chronic pain syndrome; Right shoulder pain; Spondylosis of lumbar region without myelopathy or radiculopathy; Chronic right hip pain; and Primary osteoarthritis of right hip on their pertinent problem list.  Pain Assessment: Severity of Chronic pain is reported as a 10-Worst pain ever/10. Location: Hip Right/Down back of Right leg. Onset: More than a month ago. Quality: Aching, Constant, Sharp, Throbbing, Discomfort. Timing: Constant. Modifying factor(s): Denies. Vitals:  height is 5' 1 (1.549 m) and weight is 211 lb (95.7 kg).  Her temporal temperature is 99 F (37.2 C). Her blood pressure is 142/76 (abnormal) and her pulse is 82. Her respiration is 18 and oxygen saturation is 99%.  BMI: Estimated body mass index is 39.87 kg/m as calculated from the following:   Height as of this encounter: 5' 1 (1.549 m).   Weight as of this encounter: 211 lb (95.7 kg).  Last encounter: 10/23/2023. Last procedure: 11/11/2023  Reason for encounter: medication management. No change in medical history since last visit.  Patient's pain is at baseline.  Patient continues multimodal pain regimen as prescribed.  States that it provides pain relief and improvement in functional status.   Discussed the use of AI scribe software for clinical note transcription with the patient, who gave verbal consent to proceed.  History of Present Illness   Claire [name] is a 53 year old female with fibromyalgia who presents with severe pain in the lower back and hip.  She experiences severe pain primarily in her lower back and hip, describing the pain as a 'ten on a scale of ten. The pain is exacerbated by walking, sitting, and moving. A previous hip injection did not alleviate her symptoms.  She has a history of fibromyalgia and is currently taking Lyrica  300 mg twice daily and naltrexone . She has not tried tramadol  or other pain medications for her condition. She uses diazepam  occasionally for MRIs or procedures.  There has been a significant increase in her A1c from 5.5 to 10 between March and September, which she attributes to multiple steroid injections for various conditions, including her hip, shoulder, wrist, and back. She is currently on metformin and glyburide to manage her blood sugar levels.  She has a history of carpal tunnel syndrome and has received multiple steroid injections in her wrist. Due  to her elevated A1c, her neurologist has recommended occupational therapy instead of further injections.   We discussed to initiate with  tramadol  50 mg twice daily for her fibromyalgia pain.   Discontinue naltrexone     Pharmacotherapy Assessment   Analgesic: Naltrexone  (Depade) 50 mg total by mouth daily. (Discontinue on 01/20/2024) Started tramadol  50 mg twice daily as needed for pain. Monitoring:  PMP: PDMP reviewed during this encounter.       Pharmacotherapy: No side-effects or adverse reactions reported. Compliance: No problems identified. Effectiveness: Clinically acceptable.  [name], [place]  01/20/2024  9:18 AM  Sign when Signing Visit Safety precautions to be maintained throughout the outpatient stay will include: orient to surroundings, keep bed in low position, maintain call bell within reach at all times, provide assistance with transfer out of bed and ambulation.     UDS:  No results found for: SUMMARY  No results found for: CBDTHCR No results found for: D8THCCBX No results found for: D9THCCBX  ROS  Constitutional: Denies any fever or chills Gastrointestinal: No reported hemesis, hematochezia, vomiting, or acute GI distress Musculoskeletal: Right hip pain Neurological: No reported episodes of acute onset apraxia, aphasia, dysarthria, agnosia, amnesia, paralysis, loss of coordination, or loss of consciousness  Medication Review  Calcium  Citrate-Vitamin D , Iron-Vitamin C, Lactobacillus, Multivitamin Adult, Semaglutide  (2 MG/DOSE), Vitamin D  (Ergocalciferol ), aspirin  EC, atorvastatin , budesonide -formoterol , clobetasol cream, cyanocobalamin , cyclobenzaprine , diazepam , glimepiride, ketoconazole , metFORMIN, mometasone , montelukast , naltrexone , nystatin , ondansetron , pantoprazole , pregabalin , traMADol , traZODone , triamcinolone  cream, vortioxetine  HBr, and zonisamide  History Review  Allergy: Ms. Diehl is allergic to aspartame and phenylalanine, gabapentin, glucose, linzess  [linaclotide ], other, triamcinolone , cymbalta  [duloxetine  hcl], duloxetine , tape, and tapentadol. Drug: Ms. Drolet   reports no history of drug use. Alcohol:  reports current alcohol use of about 2.0 standard drinks of alcohol per week. Tobacco:  reports that she quit smoking about 30 years ago. Her smoking use included cigarettes. She started smoking about 40 years ago. She has a 10 pack-year smoking history. She has never used smokeless tobacco. Social: Ms. Lenart  reports that she quit smoking about 30 years ago. Her smoking use included cigarettes. She started smoking about 40 years ago. She has a 10 pack-year smoking history. She has never used smokeless tobacco. She reports current alcohol use of about 2.0 standard drinks of alcohol per week. She reports that she does not use drugs. Medical:  has a past medical history of Allergy, Anemia, Anxiety, Arthritis, Asthma, Bilateral leg cramps, Depression, Diabetes mellitus without complication (HCC), DJD (degenerative joint disease), Dyspnea on exertion, GERD (gastroesophageal reflux disease), Headache, IBS (irritable bowel syndrome), Kidney stone, Low serum vitamin B12, Lung nodules, Migraine, Muscle spasm, Nonobstructive CAD (coronary artery disease), Oxygen deficiency ([id number]), Psoriasis, Seasonal allergies, Sleep apnea, Vasovagal syncope, and Vitamin D  deficiency. Surgical: Ms. Bartelson  has a past surgical history that includes Laparoscopic gastric sleeve resection (2012); Cholecystectomy (2010); Bladder surgery (2010); Plantar fascia surgery (Bilateral); Breast biopsy (Right, 2014); Colonoscopy (2014); Upper gi endoscopy; Tonsillectomy; laparoscopy (N/A, 06/16/2015); Robotic assisted salpingo oophorectomy (Right, 06/16/2015); Dilatation & currettage/hysteroscopy with novasure ablation (N/A, 06/16/2015); Lysis of adhesion (N/A, 06/16/2015); Colonoscopy with propofol  (N/A, 01/28/2018); Esophagogastroduodenoscopy (egd) with propofol  (N/A, 01/28/2018); Lithotripsy (12/11/2017); laparoscopy (N/A, 04/01/2018); Gastric Roux-En-Y (N/A, 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 Family: family history includes ADD / ADHD in her daughter, sister, son, and son; Anxiety disorder in her mother; Arthritis in her mother; Asthma in her father; Breast cancer in her paternal aunt and paternal  grandmother; COPD in her father; Cancer in her father; Depression in her mother; Diabetes in her father, maternal grandmother, and mother; Early death in her sister; Heart attack in her maternal uncle, mother, paternal grandfather, and sister; Heart disease in her father and mother; Hyperlipidemia in her father; Lung cancer in her father and maternal grandfather; Multiple myeloma in her mother; Stroke in her mother.  Laboratory Chemistry Profile   Renal Lab Results  Component Value Date   BUN 8 12/19/2023   CREATININE 0.65 12/19/2023   LABCREA 126 12/19/2023   BCR SEE NOTE: 12/19/2023   GFRAA >60 11/04/2019   GFRNONAA >60 11/04/2019    Hepatic Lab Results  Component Value Date   AST 52 (H) 12/19/2023   ALT 60 (H) 12/19/2023   ALBUMIN 4.1 11/03/2019   ALKPHOS 67 11/03/2019   LIPASE 30 04/02/2019    Electrolytes Lab Results  Component Value Date   NA 134 (L) 12/19/2023   K 4.2 12/19/2023   CL 99 12/19/2023   CALCIUM  9.1 12/19/2023   MG 1.8 11/04/2019    Bone Lab Results  Component Value Date   VD25OH 28 (L) 12/19/2023    Inflammation (CRP: Acute Phase) (ESR: Chronic Phase) No results found for: CRP, ESRSEDRATE, LATICACIDVEN       Note: Above Lab results reviewed.  Recent Imaging Review  MR HIP RIGHT WO CONTRAST MR HIP WITHOUT IV CONTRAST RIGHT  COMPARISON: None.  CLINICAL HISTORY: Right hip pain. Articular cartilage evaluation.  PULSE SEQUENCES: AX T1, Ax T2 FS, Cor T1, COR STIR & SMALL FOV COR PD FS without IV contrast  FINDINGS:  Bones and labrum: There is no accelerated arthrosis. There is no subchondral reactive edema or joint effusion. There  is a mild collar osteophyte with slight joint space loss. Visualized labrum is grossly intact. Evaluation labrum is somewhat limited. There is a stable likely benign lesion in the posterior left iliac wing when compared with remote MRI lumbar spine from 2024. No aggressive features. However, this is new when compared with remote examination. Not confidently seen by recent CT. Otherwise, the pelvis, sacrum and SI joints are unremarkable. Limited evaluation lumbar spine is unremarkable.  Musculotendinous structures: No significant musculotendinous abnormality is identified. There is no tendinosis or bursal collection.  IMPRESSION: Mild degenerative arthrosis without accelerated arthrosis or acute abnormality. No evidence to suggest a labral tear.  No significant musculotendinous abnormality.  Likely benign lesion in the posterior left iliac wing with a peripheral rim of fat. No aggressive features. This is unchanged when compared with prior MRI of the lumbar spine from 2024.  Electronically signed by: Norleen Satchel MD 12/17/2023 04:17 PM EDT RP Workstation: [id number] Note: Reviewed        Physical Exam  Vitals: BP (!) 142/76 (BP Location: Right Arm, Patient Position: Sitting, Cuff Size: Normal)   Pulse 82   Temp 99 F (37.2 C) (Temporal)   Resp 18   Ht 5' 1 (1.549 m)   Wt 211 lb (95.7 kg)   SpO2 99%   BMI 39.87 kg/m  BMI: Estimated body mass index is 39.87 kg/m as calculated from the following:   Height as of this encounter: 5' 1 (1.549 m).   Weight as of this encounter: 211 lb (95.7 kg). Ideal: Ideal body weight: 47.8 kg (105 lb 6.1 oz) Adjusted ideal body weight: 67 kg (147 lb 10 oz) General appearance: Well nourished, well developed, and well hydrated. In no apparent acute distress Mental status: Alert, oriented x 3 (person,  place, & time)       Respiratory: No evidence of acute respiratory distress Eyes: PERLA  Musculoskeletal: Right hip pain Assessment    Diagnosis Status  1. Chronic right hip pain   2. Primary osteoarthritis of right hip   3. Fibromyalgia   4. Chronic pain syndrome   5. Chronic hip pain, bilateral   6. Lumbosacral pain   7. Spondylosis of lumbar region without myelopathy or radiculopathy   8. Spondylolisthesis of lumbar region   9. Myofascial pain syndrome    Controlled Controlled Controlled   Updated Problems: No problems updated.  Plan of Care  Problem-specific:  Assessment and Plan    Right hip pain and unilateral primary osteoarthritis, right hip Severe pain exacerbated by movement, sitting, and walking. Previous steroid injection ineffective. Steroid injections contraindicated due to hyperglycemia. - Started tramadol  50 mg every 12 hours as needed for pain for 30 days. - Evaluate tramadol  effectiveness after 30 days; consider dosage adjustment if effective. - Discontinue naltrexone   Fibromyalgia and chronic pain syndrome Chronic pain with fibromyalgia, severe pain level reported. Naltrexone  discontinued for better management with tramadol . - Discontinued naltrexone . - Continue Lyrica  300 mg twice daily. - Initiated tramadol  50 mg every 12 hours as needed for pain.  Low back pain with lumbar spondylosis and lumbar spondylolisthesis Chronic pain with lumbar spondylosis and spondylolisthesis. Steroid injections contraindicated. - Initiated tramadol  50 mg every 12 hours as needed for pain.  Type 2 diabetes mellitus with hyperglycemia Recent hyperglycemia exacerbation likely due to steroid injections. A1c increased from 5.5 to 10.0. - Continue metformin and glyburide for glycemic control. - Avoid steroid injections due to risk of hyperglycemia.       Ms. Maybel E Weist has a current medication list which includes the following long-term medication(s): atorvastatin , budesonide -formoterol , calcium  citrate chewy bite, glimepiride, metformin, mometasone , montelukast , pregabalin , trazodone , and  zonisamide.  Pharmacotherapy (Medications Ordered): Meds ordered this encounter  Medications   traMADol  (ULTRAM ) 50 MG tablet    Sig: Take 1 tablet (50 mg total) by mouth every 12 (twelve) hours as needed for severe pain (pain score 7-10).    Dispense:  60 tablet    Refill:  0    Fill one day early if pharmacy is closed on scheduled refill date. Do not fill until: To last until:   Orders:  No orders of the defined types were placed in this encounter.       Return in about 1 month (around 02/20/2024) for (F2F), (MM), Emmy Blanch NP.    Recent Visits Date Type Provider Dept  12/10/23 Office Visit Marcelino Nurse, MD [facility]  11/11/23 Procedure visit Marcelino Nurse, MD [facility]  10/23/23 Office Visit Wataru Mccowen K, NP [facility]  Showing recent visits within past 90 days and meeting all other requirements Today's Visits Date Type Provider Dept  01/20/24 Office Visit Troi Bechtold K, NP [facility]  Showing today's visits and meeting all other requirements Future Appointments Date Type Provider Dept  02/17/24 Appointment Lin Hackmann K, NP [facility]  Showing future appointments within next 90 days and meeting all other requirements  I discussed the assessment and treatment plan with the patient. The patient was provided an opportunity to ask questions and all were answered. The patient agreed with the plan and demonstrated an understanding of the instructions.  Patient advised to call back or seek an in-person evaluation if the symptoms or condition worsens.  I personally spent a total of  30 minutes in the care of the patient today including preparing to see the patient, getting/reviewing separately obtained history, performing a medically appropriate exam/evaluation, counseling and educating, placing orders, referring and communicating with other health care professionals, documenting clinical information in the EHR,  independently interpreting results, communicating results, and coordinating care.   Note by: Harrol Novello K Keath Matera, NP (TTS and AI technology used. I apologize for any typographical errors that were not detected and corrected.) Date: 01/20/2024; Time: 10:08 AM

## 2024-01-21 ENCOUNTER — Ambulatory Visit: Payer: PRIVATE HEALTH INSURANCE

## 2024-01-28 ENCOUNTER — Encounter: Payer: Self-pay | Admitting: Family Medicine

## 2024-01-31 ENCOUNTER — Other Ambulatory Visit: Payer: Self-pay | Admitting: Family Medicine

## 2024-01-31 DIAGNOSIS — J4530 Mild persistent asthma, uncomplicated: Secondary | ICD-10-CM

## 2024-02-03 NOTE — Telephone Encounter (Signed)
 Requested Prescriptions  Pending Prescriptions Disp Refills   budesonide -formoterol  (SYMBICORT ) 160-4.5 MCG/ACT inhaler [Pharmacy Med Name: BUDESONIDE -FORMOTEROL  160-4.5] 10.2 each 2    Sig: INHALE 2 PUFFS INTO THE LUNGS TWICE A DAY     Pulmonology:  Combination Products Passed - 02/03/2024  2:37 PM      Passed - Valid encounter within last 12 months    Recent Outpatient Visits           1 month ago Well adult exam   [facility] Glenard Mire, MD   2 months ago Atherosclerosis of aorta   [facility] Glenard Mire, MD   9 months ago Type 2 diabetes mellitus with cardiac complication [facility])   [facility] Sowles, Krichna, MD

## 2024-02-05 ENCOUNTER — Ambulatory Visit: Payer: PRIVATE HEALTH INSURANCE | Admitting: Family Medicine

## 2024-02-05 ENCOUNTER — Other Ambulatory Visit: Payer: Self-pay | Admitting: Family Medicine

## 2024-02-05 ENCOUNTER — Ambulatory Visit: Payer: PRIVATE HEALTH INSURANCE

## 2024-02-05 DIAGNOSIS — E1159 Type 2 diabetes mellitus with other circulatory complications: Secondary | ICD-10-CM

## 2024-02-07 NOTE — Telephone Encounter (Signed)
 Requested Prescriptions  Pending Prescriptions Disp Refills   OZEMPIC , 2 MG/DOSE, 8 MG/3ML SOPN [Pharmacy Med Name: OZEMPIC  8 MG/3 ML (2 MG/DOSE)] 9 mL 0    Sig: INJECT 2 MG INTO THE SKIN ONCE A WEEK.     Endocrinology:  Diabetes - GLP-1 Receptor Agonists - semaglutide  Failed - 02/07/2024  1:37 PM      Failed - HBA1C in normal range and within 180 days    Hgb A1c MFr Bld  Date Value Ref Range Status  12/19/2023 10.0 (H) <5.7 % Final    Comment:    For someone without known diabetes, a hemoglobin A1c value of 6.5% or greater indicates that they may have  diabetes and this should be confirmed with a follow-up  test. . For someone with known diabetes, a value <7% indicates  that their diabetes is well controlled and a value  greater than or equal to 7% indicates suboptimal  control. A1c targets should be individualized based on  duration of diabetes, age, comorbid conditions, and  other considerations. . Currently, no consensus exists regarding use of hemoglobin A1c for diagnosis of diabetes for children. .          Passed - Cr in normal range and within 360 days    Creat  Date Value Ref Range Status  12/19/2023 0.65 0.50 - 1.03 mg/dL Final   Creatinine, POC  Date Value Ref Range Status  11/19/2016 NEG mg/dL Final   Creatinine, Urine  Date Value Ref Range Status  12/19/2023 126 20 - 275 mg/dL Final         Passed - Valid encounter within last 6 months    Recent Outpatient Visits           1 month ago Well adult exam   [facility] Glenard Mire, MD   3 months ago Atherosclerosis of aorta   [facility] Glenard Mire, MD   9 months ago Type 2 diabetes mellitus with cardiac complication [facility])   [facility] Sowles, Krichna, MD

## 2024-02-10 ENCOUNTER — Ambulatory Visit: Payer: PRIVATE HEALTH INSURANCE | Admitting: Occupational Therapy

## 2024-02-11 ENCOUNTER — Encounter: Payer: PRIVATE HEALTH INSURANCE | Admitting: Family Medicine

## 2024-02-16 NOTE — Progress Notes (Unsigned)
 PROVIDER NOTE: Interpretation of information contained herein should be left to medically-trained personnel. Specific patient instructions are provided elsewhere under Patient Instructions section of medical record. This document was created in part using AI and STT-dictation technology, any transcriptional errors that may result from this process are unintentional.  Patient: Claire [name]  Service: E/M   PCP: Sowles, Krichna, MD  DOB: [birth year]  DOS: 02/17/2024  Provider: Emmy MARLA Blanch, NP  MRN: [id number]  Delivery: Face-to-face  Specialty: Interventional Pain Management  Type: Established Patient  Setting: Ambulatory outpatient facility  Specialty designation: 09  Referring Prov.: Sowles, Krichna, MD  Location: Outpatient office facility       History of present illness (HPI) Claire [name], a 53 y.o. year old female, is here today because of her No [name] diagnosis found.. Claire [name] complain today is No chief complaint on file.  Pertinent problems: Ms. Sensabaugh has GERD without esophagitis; Migraine with aura and without status migrainosus; History of sleep apnea; Lung nodules; Vitamin D  deficiency; Lumbosacral pain; GAD (generalized anxiety disorder); Morbid obesity with BMI of 40.0-44.9, adult (HCC); Type 2 diabetes mellitus with obesity; History of thyroiditis; Fibromyalgia; Spondylolisthesis of lumbar region; Myofascial pain syndrome; Chronic pain syndrome; Right shoulder pain; Spondylosis of lumbar region without myelopathy or radiculopathy; Chronic right hip pain; and [name] osteoarthritis of right hip on their pertinent problem list.  Pain Assessment: Severity of   is reported as a  /10. Location:    / . Onset:  . Quality:  . Timing:  . Modifying factor(s):  [name]:  vitals were not taken for this visit.  BMI: Estimated body mass index is 39.87 kg/m as calculated from the following:   Height as of 01/20/24: 5' 1 (1.549 m).   Weight as of 01/20/24: 211 lb  (95.7 kg).  Last encounter: 01/20/2024. Last procedure: Visit date not found.  Reason for encounter: {Blank single:19197::medication management,post-procedure evaluation and assessment,both, medication management and post-procedure evaluation and assessment,evaluation of worsening, or previously known (established) problem,patient-requested evaluation,follow-up evaluation,evaluation for possible interventional PM therapy/treatment,evaluation of new problem, *** }.   Discussed the use of AI scribe software for clinical note transcription with the patient, who gave verbal consent to proceed.  History of Present Illness           Pharmacotherapy Assessment   Analgesic: Naltrexone  (Depade) 50 mg total by mouth daily. (Discontinue on 01/20/2024) Started tramadol  50 mg twice daily as needed for pain. Monitoring: [place] PMP: PDMP reviewed during this encounter.       Pharmacotherapy: No side-effects or adverse reactions reported. Compliance: No problems identified. Effectiveness: Clinically acceptable.  [name], [place]  01/20/2024  9:18 AM  Sign when Signing Visit Safety precautions to be maintained throughout the outpatient stay will include: orient to surroundings, keep bed in low position, maintain call bell within reach at all times, provide assistance with transfer out of bed and ambulation.     UDS:  No results found for: SUMMARY  No results found for: CBDTHCR No results found for: D8THCCBX No results found for: D9THCCBX  ROS  Constitutional: Denies any fever or chills Gastrointestinal: No reported hemesis, hematochezia, vomiting, or acute GI distress Musculoskeletal: Right hip pain Neurological: No reported episodes of acute onset apraxia, aphasia, dysarthria, agnosia, amnesia, paralysis, loss of coordination, or loss of consciousness  Medication Review  Calcium  Citrate-Vitamin D , Iron-Vitamin C, Lactobacillus, Multivitamin Adult, Semaglutide  (2  MG/DOSE), Vitamin D  (Ergocalciferol ), aspirin  EC, atorvastatin , budesonide -formoterol , clobetasol cream, cyanocobalamin , cyclobenzaprine , diazepam , glimepiride, ketoconazole ,  metFORMIN, mometasone , montelukast , naltrexone , nystatin , ondansetron , pantoprazole , pregabalin , traMADol , traZODone , triamcinolone  cream, vortioxetine  HBr, and zonisamide  History Review  Allergy: Claire [name] is allergic to aspartame and phenylalanine, gabapentin, glucose, linzess  [linaclotide ], other, triamcinolone , cymbalta  [duloxetine  hcl], duloxetine , tape, and tapentadol. Drug: Claire [name]  reports no history of drug use. Alcohol:  reports current alcohol use of about 2.0 standard drinks of alcohol per week. Tobacco:  reports that she quit smoking about 30 years ago. Her smoking use included cigarettes. She started smoking about 40 years ago. She has a 10 pack-year smoking history. She has never used smokeless tobacco. Social: Claire [name]  reports that she quit smoking about 30 years ago. Her smoking use included cigarettes. She started smoking about 40 years ago. She has a 10 pack-year smoking history. She has never used smokeless tobacco. She reports current alcohol use of about 2.0 standard drinks of alcohol per week. She reports that she does not use drugs. Medical:  has a past medical history of Allergy, Anemia, Anxiety, Arthritis, Asthma, Bilateral leg cramps, Depression, Diabetes mellitus without complication (HCC), DJD (degenerative joint disease), Dyspnea on exertion, GERD (gastroesophageal reflux disease), Headache, IBS (irritable bowel syndrome), Kidney stone, Low serum vitamin B12, Lung nodules, Migraine, Muscle spasm, Nonobstructive CAD (coronary artery disease), Oxygen deficiency (948778), Psoriasis, Seasonal allergies, Sleep apnea, Vasovagal syncope, and Vitamin D  deficiency. Surgical: Claire [name]  has a past surgical history that includes Laparoscopic gastric sleeve resection (2012); Cholecystectomy  (2010); Bladder surgery (2010); Plantar fascia surgery (Bilateral); Breast biopsy (Right, 2014); Colonoscopy (2014); Upper gi endoscopy; Tonsillectomy; laparoscopy (N/A, 06/16/2015); Robotic assisted salpingo oophorectomy (Right, 06/16/2015); Dilatation & currettage/hysteroscopy with novasure ablation (N/A, 06/16/2015); Lysis of adhesion (N/A, 06/16/2015); Colonoscopy with propofol  (N/A, 01/28/2018); Esophagogastroduodenoscopy (egd) with propofol  (N/A, 01/28/2018); Lithotripsy (12/11/2017); laparoscopy (N/A, 04/01/2018); Gastric Roux-En-Y (N/A, 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 Family: family history includes ADD / ADHD in her daughter, sister, son, and son; Anxiety disorder in her mother; Arthritis in her mother; Asthma in her father; Breast cancer in her paternal aunt and paternal grandmother; COPD in her father; Cancer in her father; Depression in her mother; Diabetes in her father, maternal grandmother, and mother; Early death in her sister; Heart attack in her maternal uncle, mother, paternal grandfather, and sister; Heart disease in her father and mother; Hyperlipidemia in her father; Lung cancer in her father and maternal grandfather; Multiple myeloma in her mother; Stroke in her mother.  Laboratory Chemistry Profile   Renal Lab Results  Component Value Date   BUN 8 12/19/2023   CREATININE 0.65 12/19/2023   LABCREA 126 12/19/2023   BCR SEE NOTE: 12/19/2023   GFRAA >60 11/04/2019   GFRNONAA >60 11/04/2019    Hepatic Lab Results  Component Value Date   AST 52 (H) 12/19/2023   ALT 60 (H) 12/19/2023   ALBUMIN 4.1 11/03/2019   ALKPHOS 67 11/03/2019   LIPASE 30 04/02/2019    Electrolytes Lab Results  Component Value Date   NA 134 (L) 12/19/2023   K 4.2 12/19/2023   CL 99 12/19/2023   CALCIUM  9.1 12/19/2023   MG 1.8 11/04/2019    Bone Lab Results  Component Value Date   VD25OH 28  (L) 12/19/2023    Inflammation (CRP: Acute Phase) (ESR: Chronic Phase) No results found for: CRP, ESRSEDRATE, LATICACIDVEN       Note: Above Lab results reviewed.  Recent Imaging Review  MR HIP RIGHT WO CONTRAST MR HIP WITHOUT IV CONTRAST RIGHT  COMPARISON: None.  CLINICAL HISTORY: Right hip pain. Articular cartilage evaluation.  PULSE SEQUENCES: AX T1, Ax T2 FS, Cor T1, COR STIR & SMALL FOV COR PD FS without IV contrast  FINDINGS:  Bones and labrum: There is no accelerated arthrosis. There is no subchondral reactive edema or joint effusion. There is a mild collar osteophyte with slight joint space loss. Visualized labrum is grossly intact. Evaluation labrum is somewhat limited. There is a stable likely benign lesion in the posterior left iliac wing when compared with remote MRI lumbar spine from 2024. No aggressive features. However, this is new when compared with remote examination. Not confidently seen by recent CT. Otherwise, the pelvis, sacrum and SI joints are unremarkable. Limited evaluation lumbar spine is unremarkable.  Musculotendinous structures: No significant musculotendinous abnormality is identified. There is no tendinosis or bursal collection.  IMPRESSION: Mild degenerative arthrosis without accelerated arthrosis or acute abnormality. No evidence to suggest a labral tear.  No significant musculotendinous abnormality.  Likely benign lesion in the posterior left iliac wing with a peripheral rim of fat. No aggressive features. This is unchanged when compared with prior MRI of the lumbar spine from 2024.  Electronically signed by: Norleen Satchel MD 12/17/2023 04:17 PM EDT RP Workstation: [id number] Note: Reviewed        Physical Exam  Vitals: BP (!) 142/76 (BP Location: Right Arm, Patient Position: Sitting, Cuff Size: Normal)   Pulse 82   Temp 99 F (37.2 C) (Temporal)   Resp 18   Ht 5' 1 (1.549 m)   Wt 211 lb (95.7 kg)   SpO2 99%   BMI  39.87 kg/m  BMI: Estimated body mass index is 39.87 kg/m as calculated from the following:   Height as of this encounter: 5' 1 (1.549 m).   Weight as of this encounter: 211 lb (95.7 kg). Ideal: Ideal body weight: 47.8 kg (105 lb 6.1 oz) Adjusted ideal body weight: 67 kg (147 lb 10 oz) General appearance: Well nourished, well developed, and well hydrated. In no apparent acute distress Mental status: Alert, oriented x 3 (person, place, & time)       Respiratory: No evidence of acute respiratory distress Eyes: PERLA  Musculoskeletal: Right hip pain Assessment   Diagnosis Status  1. Chronic right hip pain   2. [name] osteoarthritis of right hip   3. Fibromyalgia   4. Chronic pain syndrome   5. Chronic hip pain, bilateral   6. Lumbosacral pain   7. Spondylosis of lumbar region without myelopathy or radiculopathy   8. Spondylolisthesis of lumbar region   9. Myofascial pain syndrome    Controlled Controlled Controlled   Updated Problems: No problems updated.  Plan of Care  Problem-specific:  Assessment and Plan    Monitoring: [place] PMP: PDMP not reviewed this encounter. {Blank single:19197::Unable to conduct review of the controlled substance reporting system due to technological failure.,     } Pharmacotherapy: {Blank single:19197::Opioid-induced constipation (OIC)(K59.03, T40.2X5A),No side-effects or adverse reactions reported.} Compliance: {Blank single:19197::Medication agreement violation - unsanctioned acquisition/use of additional opioid-containing medication,No problems identified.} Effectiveness: {Blank single:19197::Clinically acceptable.}  No notes on file  UDS:  No results found for: SUMMARY  No results found for: CBDTHCR No results found for: D8THCCBX No results found for: D9THCCBX  ROS  Constitutional: {Blank single:19197::Denies any fever or chills} Gastrointestinal: {Blank single:19197::No reported hemesis, hematochezia,  vomiting, or acute GI distress} Musculoskeletal: {Blank single:19197::Denies any acute onset joint swelling, redness, loss of ROM, or weakness} Neurological: {Blank single:19197::No reported episodes of acute  onset apraxia, aphasia, dysarthria, agnosia, amnesia, paralysis, loss of coordination, or loss of consciousness}  Medication Review  Calcium  Citrate-Vitamin D , Iron-Vitamin C, Lactobacillus, Multivitamin Adult, Semaglutide  (2 MG/DOSE), Vitamin D  (Ergocalciferol ), aspirin  EC, atorvastatin , budesonide -formoterol , clobetasol cream, cyanocobalamin , cyclobenzaprine , diazepam , glimepiride, ketoconazole , metFORMIN, mometasone , montelukast , nystatin , ondansetron , pantoprazole , pregabalin , traMADol , traZODone , triamcinolone  cream, vortioxetine  HBr, and zonisamide  History Review  Allergy: Ms. Dattilio is allergic to aspartame and phenylalanine, gabapentin, glucose, linzess  [linaclotide ], other, triamcinolone , cymbalta  [duloxetine  hcl], duloxetine , tape, and tapentadol. Drug: Ms. Sonntag  reports no history of drug use. Alcohol:  reports current alcohol use of about 2.0 standard drinks of alcohol per week. Tobacco:  reports that she quit smoking about 31 years ago. Her smoking use included cigarettes. She started smoking about 41 years ago. She has a 10 pack-year smoking history. She has never used smokeless tobacco. Social: Ms. Keener  reports that she quit smoking about 31 years ago. Her smoking use included cigarettes. She started smoking about 41 years ago. She has a 10 pack-year smoking history. She has never used smokeless tobacco. She reports current alcohol use of about 2.0 standard drinks of alcohol per week. She reports that she does not use drugs. Medical:  has a past medical history of Allergy, Anemia, Anxiety, Arthritis, Asthma, Bilateral leg cramps, Depression, Diabetes mellitus without complication (HCC), DJD (degenerative joint disease), Dyspnea on exertion, GERD (gastroesophageal  reflux disease), Headache, IBS (irritable bowel syndrome), Kidney stone, Low serum vitamin B12, Lung nodules, Migraine, Muscle spasm, Nonobstructive CAD (coronary artery disease), Oxygen deficiency (948778), Psoriasis, Seasonal allergies, Sleep apnea, Vasovagal syncope, and Vitamin D  deficiency. Surgical: Ms. Devora  has a past surgical history that includes Laparoscopic gastric sleeve resection (2012); Cholecystectomy (2010); Bladder surgery (2010); Plantar fascia surgery (Bilateral); Breast biopsy (Right, 2014); Colonoscopy (2014); Upper gi endoscopy; Tonsillectomy; laparoscopy (N/A, 06/16/2015); Robotic assisted salpingo oophorectomy (Right, 06/16/2015); Dilatation & currettage/hysteroscopy with novasure ablation (N/A, 06/16/2015); Lysis of adhesion (N/A, 06/16/2015); Colonoscopy with propofol  (N/A, 01/28/2018); Esophagogastroduodenoscopy (egd) with propofol  (N/A, 01/28/2018); Lithotripsy (12/11/2017); laparoscopy (N/A, 04/01/2018); Gastric Roux-En-Y (N/A, 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 Family: family history includes ADD / ADHD in her daughter, sister, son, and son; Anxiety disorder in her mother; Arthritis in her mother; Asthma in her father; Breast cancer in her paternal aunt and paternal grandmother; COPD in her father; Cancer in her father; Depression in her mother; Diabetes in her father, maternal grandmother, and mother; Early death in her sister; Heart attack in her maternal uncle, mother, paternal grandfather, and sister; Heart disease in her father and mother; Hyperlipidemia in her father; Lung cancer in her father and maternal grandfather; Multiple myeloma in her mother; Stroke in her mother.  Laboratory Chemistry Profile   Renal Lab Results  Component Value Date   BUN 8 12/19/2023   CREATININE 0.65 12/19/2023   LABCREA 126 12/19/2023   BCR SEE NOTE: 12/19/2023   GFRAA >60  11/04/2019   GFRNONAA >60 11/04/2019    Hepatic Lab Results  Component Value Date   AST 52 (H) 12/19/2023   ALT 60 (H) 12/19/2023   ALBUMIN 4.1 11/03/2019   ALKPHOS 67 11/03/2019   LIPASE 30 04/02/2019    Electrolytes Lab Results  Component Value Date   NA 134 (L) 12/19/2023   K 4.2 12/19/2023   CL 99 12/19/2023   CALCIUM  9.1 12/19/2023   MG 1.8 11/04/2019    Bone Lab Results  Component Value Date   VD25OH 28 (L) 12/19/2023  Inflammation (CRP: Acute Phase) (ESR: Chronic Phase) No results found for: CRP, ESRSEDRATE, LATICACIDVEN       Note: {Blank single:19197::Ms. Wheller indicates labs done and monitored by [name] care practitioner using a non-[facility] EMR system,No results found under the [facility] electronic medical record,Patient noncompliant with Lab Work orders.,Results made available to patient.,Lab results reviewed and made available to patient.,Lab results reviewed and explained to patient in Layman's terms.,Above Lab results reviewed.}  Recent Imaging Review  MM 3D SCREENING MAMMOGRAM BILATERAL BREAST CLINICAL DATA:  Screening.  EXAM: DIGITAL SCREENING BILATERAL MAMMOGRAM WITH TOMOSYNTHESIS AND CAD  TECHNIQUE: Bilateral screening digital craniocaudal and mediolateral oblique mammograms were obtained. Bilateral screening digital breast tomosynthesis was performed. The images were evaluated with computer-aided detection.  COMPARISON:  Previous exam(s).  ACR Breast Density Category b: There are scattered areas of fibroglandular density.  FINDINGS: There are no findings suspicious for malignancy.  IMPRESSION: No mammographic evidence of malignancy. A result letter of this screening mammogram will be mailed directly to the patient.  RECOMMENDATION: Screening mammogram in one year. (Code:SM-B-01Y)  BI-RADS CATEGORY  1: Negative.  Electronically Signed   By: Curtistine Noble   On: 01/20/2024 10:49 Note: {Blank  single:19197::No new results found.,No results found under the [facility] electronic medical record.,Imaging results reviewed and explained to patient in Layman's terms.,Results of ordered imaging test(s) reviewed and explained to patient in Layman's terms.,Imaging results reviewed.,Reviewed} {Blank single:19197::Results visible under [facility].,Results visible under [facility].,Results visible under [facility].,Results visible under [facility].,Results visible under Care Everywhere.,Results made available to patient.,Copy of results provided to patient.,Patient noncompliant with diagnostic imaging orders.,     }  Physical Exam  Vitals: There were no vitals taken for this visit. BMI: Estimated body mass index is 39.87 kg/m as calculated from the following:   Height as of 01/20/24: 5' 1 (1.549 m).   Weight as of 01/20/24: 211 lb (95.7 kg). Ideal: Patient weight not recorded General appearance: {general exam:210120802::Well nourished, well developed, and well hydrated. In no apparent acute distress} Mental status: {Blank single:19197::Alert and oriented x 3. Exaggerated physical and/or psychosocial pain behavior perceived.,Alert, oriented x 3 (person, place, & time)} {Blank single:19197::Ms. Kempe's speech pattern and demeanor seems to suggest oversedation,     } Respiratory: {Blank single:19197::Oxygen-dependent COPD,No evidence of acute respiratory distress} Eyes: {Blank single:19197::Miotic (pupilary constriction) due to opiate use,Midriatic,Anisocoric,Evidence of ptosis,Pin-point pupils,PERLA}   Assessment   Diagnosis Status  No diagnosis found. {Blank single:19197::Absent,Deteriorating,Having a Flare-up,Improved,Improving,Not improving,Not responding,Persistent,Present,Recurring,Reoccurring,Resolved,Responding,Stable,Unchanged,improved,Worsened,Worsening,Controlled} {Blank  single:19197::Absent,Deteriorating,Having a Flare-up,Improved,Improving,Not improving,Not responding,Persistent,Present,Recurring,Reoccurring,Resolved,Responding,Stable,Unchanged,improved,Worsened,Worsening,Controlled} {Blank single:19197::Absent,Deteriorating,Having a Flare-up,Improved,Improving,Not improving,Not responding,Persistent,Present,Recurring,Reoccurring,Resolved,Responding,Stable,Unchanged,improved,Worsened,Worsening,Controlled}   Updated Problems: No problems updated.  Plan of Care  Problem-specific:  Assessment and Plan            Ms. Jonne E Crombie has a current medication list which includes the following long-term medication(s): atorvastatin , budesonide -formoterol , calcium  citrate chewy bite, glimepiride, metformin, mometasone , montelukast , pregabalin , trazodone , and zonisamide.  Pharmacotherapy (Medications Ordered): No orders of the defined types were placed in this encounter.  Orders:  No orders of the defined types were placed in this encounter.    {There is no content from the last Plan section.}   No follow-ups on file.    Recent Visits Date Type Provider Dept  01/20/24 Office Visit Miaisabella Bacorn K, NP [facility]  12/10/23 Office Visit Marcelino Nurse, MD [facility]  Showing recent visits within past 90 days and meeting all other requirements Future Appointments Date Type Provider Dept  02/17/24 Appointment Moncia Annas K, NP [facility]  Showing future appointments within next 90 days and meeting all other requirements  I discussed the assessment and treatment plan with the patient. The patient was provided an opportunity to ask questions and all were answered. The patient agreed with the plan and demonstrated an understanding of the instructions.  Patient advised to call back or seek an in-person evaluation if the symptoms or condition  worsens.  Duration of encounter: *** minutes.  Total time on encounter, as per AMA guidelines included both the face-to-face and non-face-to-face time personally spent by the physician and/or other qualified health care professional(s) on the day of the encounter (includes time in activities that require the physician or other qualified health care professional and does not include time in activities normally performed by clinical staff). Physician's time may include the following activities when performed: Preparing to see the patient (e.g., pre-charting review of records, searching for previously ordered imaging, lab work, and nerve conduction tests) Review of prior analgesic pharmacotherapies. Reviewing PMP Interpreting ordered tests (e.g., lab work, imaging, nerve conduction tests) Performing post-procedure evaluations, including interpretation of diagnostic procedures Obtaining and/or reviewing separately obtained history Performing a medically appropriate examination and/or evaluation Counseling and educating the patient/family/caregiver Ordering medications, tests, or procedures Referring and communicating with other health care professionals (when not separately reported) Documenting clinical information in the electronic or other health record Independently interpreting results (not separately reported) and communicating results to the patient/ family/caregiver Care coordination (not separately reported)  Note by: Blondell Laperle K Lashona Schaaf, NP (TTS and AI technology used. I apologize for any typographical errors that were not detected and corrected.) Date: 02/17/2024; Time: 12:06 PM

## 2024-02-17 ENCOUNTER — Encounter: Payer: Self-pay | Admitting: Nurse Practitioner

## 2024-02-17 ENCOUNTER — Ambulatory Visit: Payer: PRIVATE HEALTH INSURANCE | Admitting: Nurse Practitioner

## 2024-02-17 VITALS — BP 124/69 | HR 75 | Temp 97.2°F | Resp 18 | Ht 61.0 in | Wt 207.0 lb

## 2024-02-17 DIAGNOSIS — M1611 Unilateral primary osteoarthritis, right hip: Secondary | ICD-10-CM | POA: Diagnosis not present

## 2024-02-17 DIAGNOSIS — M797 Fibromyalgia: Secondary | ICD-10-CM | POA: Insufficient documentation

## 2024-02-17 DIAGNOSIS — Z79899 Other long term (current) drug therapy: Secondary | ICD-10-CM | POA: Diagnosis not present

## 2024-02-17 DIAGNOSIS — M25552 Pain in left hip: Secondary | ICD-10-CM | POA: Diagnosis not present

## 2024-02-17 DIAGNOSIS — M4316 Spondylolisthesis, lumbar region: Secondary | ICD-10-CM | POA: Insufficient documentation

## 2024-02-17 DIAGNOSIS — M25551 Pain in right hip: Secondary | ICD-10-CM | POA: Diagnosis not present

## 2024-02-17 DIAGNOSIS — G894 Chronic pain syndrome: Secondary | ICD-10-CM | POA: Insufficient documentation

## 2024-02-17 DIAGNOSIS — M47816 Spondylosis without myelopathy or radiculopathy, lumbar region: Secondary | ICD-10-CM | POA: Diagnosis not present

## 2024-02-17 DIAGNOSIS — M545 Low back pain, unspecified: Secondary | ICD-10-CM | POA: Insufficient documentation

## 2024-02-17 DIAGNOSIS — G8929 Other chronic pain: Secondary | ICD-10-CM | POA: Insufficient documentation

## 2024-02-17 MED ORDER — TRAMADOL HCL 50 MG PO TABS: 50.0000 mg | ORAL_TABLET | Freq: Three times a day (TID) | ORAL | 2 refills | Status: AC | PRN

## 2024-02-17 NOTE — Patient Instructions (Signed)

## 2024-02-17 NOTE — Progress Notes (Signed)
 Nursing Pain Medication Assessment:  Safety precautions to be maintained throughout the outpatient stay will include: orient to surroundings, keep bed in low position, maintain call bell within reach at all times, provide assistance with transfer out of bed and ambulation.  Medication Inspection Compliance: Pill count conducted under aseptic conditions, in front of the patient. Neither the pills nor the bottle was removed from the patient's sight at any time. Once count was completed pills were immediately returned to the patient in their original bottle.  Medication: Tramadol  (Ultram ) Pill/Patch Count: 6 of 60 pills/patches remain Pill/Patch Appearance: Markings consistent with prescribed medication Bottle Appearance: Standard pharmacy container. Clearly labeled. Filled Date: 60 / 01 / 2025 Last Medication intake:  Today

## 2024-02-18 ENCOUNTER — Ambulatory Visit: Payer: PRIVATE HEALTH INSURANCE | Attending: Neurology | Admitting: Occupational Therapy

## 2024-02-18 DIAGNOSIS — M6281 Muscle weakness (generalized): Secondary | ICD-10-CM | POA: Diagnosis present

## 2024-02-18 NOTE — Therapy (Addendum)
 " OUTPATIENT OCCUPATIONAL THERAPY ORTHO EVALUATION  Patient Name: ZYLAH ELSBERND MRN: [id number] DOB:03-May-1970, 53 y.o., female Today's Date: 02/18/2024  PCP: Glenard Mire, MD REFERRING PROVIDER: Maree Hila, MD  END OF SESSION:  OT End of Session - 02/18/24 1753     Visit Number 1    Number of Visits 24    Date for Recertification  05/12/24    OT Start Time 1145    OT Stop Time 1230    OT Time Calculation (min) 45 min    Activity Tolerance Patient tolerated treatment well    Behavior During Therapy [facility] for tasks assessed/performed          Past Medical History:  Diagnosis Date   Allergy    All on file   Anemia    Anxiety    Arthritis    hands, hips   Asthma    Bilateral leg cramps    Depression    Diabetes mellitus without complication (HCC)    history no longer a problem since weight loss surgery   DJD (degenerative joint disease)    Dyspnea on exertion    a. 07/2019 Echo: EF 60-65%, no rwma, nl RV fxn, triv MR.   GERD (gastroesophageal reflux disease)    Headache    Migraines   IBS (irritable bowel syndrome)    Kidney stone    Low serum vitamin B12    Lung nodules    Migraine    down to approx 4x/mo since starting meds   Muscle spasm    Nonobstructive CAD (coronary artery disease)    a. 06/2014 ETT: Ex time 8:59, max HR 155, 10.1 METS, no ST/T changes; b. 08/2019 Cor CTA: Ca2+ 175 (99th %'ile), LAD 35-49p, LCX 0-40m-->Med rx.   Oxygen deficiency [phone]   Due to COVID   Psoriasis    vaginal area   Seasonal allergies    Sleep apnea    history no longer a problem since weight loss surgery   Vasovagal syncope    [place], Dr. Ferdinand    Vitamin D  deficiency    Past Surgical History:  Procedure Laterality Date   APPENDECTOMY     BLADDER SURGERY  2010   BREAST BIOPSY Right 2014   stereotatic biopsy   CHOLECYSTECTOMY  2010   COLONOSCOPY  2014   Done at [facility]   COLONOSCOPY WITH PROPOFOL  N/A 01/28/2018   Procedure: COLONOSCOPY WITH PROPOFOL ;   Surgeon: Janalyn Keene NOVAK, MD;  Location: [facility];  Service: Endoscopy;  Laterality: N/A;   DILITATION & CURRETTAGE/HYSTROSCOPY WITH NOVASURE ABLATION N/A 06/16/2015   Procedure: DILATATION & CURETTAGE/HYSTEROSCOPY WITH NOVASURE ABLATION;  Surgeon: Charlie Flowers, MD;  Location: [facility];  Service: Gynecology;  Laterality: N/A;   ESOPHAGOGASTRODUODENOSCOPY N/A 10/24/2023   Procedure: EGD (ESOPHAGOGASTRODUODENOSCOPY);  Surgeon: Therisa Bi, MD;  Location[facility];  Service: Gastroenterology;  Laterality: N/A;   ESOPHAGOGASTRODUODENOSCOPY (EGD) WITH PROPOFOL  N/A 01/28/2018   Procedure: ESOPHAGOGASTRODUODENOSCOPY (EGD) WITH BIOPSIES;  Surgeon: Janalyn Keene NOVAK, MD;  Location: [facility];  Service: Endoscopy;  Laterality: N/A;   GASTRIC ROUX-EN-Y N/A 11/03/2019   Procedure: LAPAROSCOPIC 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 WITH UPPER ENDOSCOPY;  Surgeon: Gladis Cough, MD;  Location: [facility];  Service: General;  Laterality: N/A;   HERNIA REPAIR  2012   HIATAL HERNIA REPAIR N/A 11/03/2019   Procedure: HERNIA REPAIR HIATAL;  Surgeon: Gladis Cough, MD;  Location: [facility];  Service: General;  Laterality: N/A;   LAPAROSCOPIC GASTRIC SLEEVE RESECTION  2012   LAPAROSCOPY N/A 06/16/2015   Procedure: LAPAROSCOPY DIAGNOSTIC;  Surgeon: Charlie Flowers, MD;  Location: [facility];  Service: Gynecology;  Laterality: N/A;   LAPAROSCOPY N/A 04/01/2018   Procedure: LAPAROSCOPY DIAGNOSTIC ERAS PATHWAY ENTEROLYSIS, CECOPEXY;  Surgeon: Gladis Cough, MD;  Location: [facility];  Service: General;  Laterality: N/A;   LITHOTRIPSY  12/11/2017   laser lithotripsy   LYSIS OF ADHESION N/A 06/16/2015   Procedure: LYSIS OF ADHESION;  Surgeon: Charlie Flowers, MD;  Location: [facility];  Service: Gynecology;  Laterality: N/A;   PLANTAR FASCIA SURGERY Bilateral    ROBOTIC ASSISTED SALPINGO OOPHERECTOMY Right 06/16/2015   Procedure: ROBOTIC ASSISTED SALPINGO OOPHORECTOMY,  EXCISION OF RIGHT CUL DE SAC MASS;  Surgeon: Charlie Flowers, MD;  Location: [facility];  Service: Gynecology;  Laterality: Right;   TONSILLECTOMY     UPPER GI ENDOSCOPY     Patient Active Problem List   Diagnosis Date Noted   Primary osteoarthritis of right hip 12/10/2023   Chronic right hip pain 10/23/2023   Spondylosis of lumbar region without myelopathy or radiculopathy 09/23/2023   Myofascial pain syndrome 08/12/2023   Chronic pain syndrome 08/12/2023   Right shoulder pain 08/12/2023   Spondylolisthesis of lumbar region 01/25/2023   Morbid obesity with BMI of 40.0-44.9, adult (HCC) 07/10/2021   ADD (attention deficit disorder) 07/10/2021   Type 2 diabetes mellitus with obesity 07/10/2021   History of thyroiditis 07/10/2021   Fibromyalgia 07/10/2021   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/03/2019   B12 deficiency 04/10/2018   Columnar epithelial-lined lower esophagus    Postprocedural disorder of digestive system    Esophageal dysphagia    Persistent mood (affective) disorder, unspecified 01/15/2018   Insomnia related to another mental disorder 01/15/2018   OCD (obsessive compulsive disorder) 10/29/2017   GAD (generalized anxiety disorder) 10/29/2017   Nephrolithiasis 10/22/2017   Atherosclerosis of aorta 12/21/2016   Hiatal hernia 08/17/2016   Diverticulosis of colon 08/17/2016   Lumbosacral pain 01/07/2015   Intertrigo 11/05/2014   Perennial allergic rhinitis 09/03/2014   Lung nodules 09/03/2014   Vitamin D  deficiency 09/03/2014   History of kidney stones 09/03/2014   History of iron deficiency anemia 09/03/2014   Mild persistent asthma without complication 11/21/2012   GERD without esophagitis 11/21/2012   History of hyperlipidemia 11/21/2012   Migraine with aura and without status migrainosus 11/21/2012   History of sleep apnea 11/21/2012   IBS (irritable bowel  syndrome) 08/15/2012    ONSET DATE: 2020  REFERRING DIAG: Right Carpal Tunnel Syndrome  THERAPY DIAG:  Muscle weakness (generalized)  Rationale for Evaluation and Treatment: Rehabilitation  SUBJECTIVE:   SUBJECTIVE STATEMENT: Pt. Report having persistent right wrist pain. Pt accompanied by: self  PERTINENT HISTORY: Pt. Is 53 y.o. female who was referred for OT services for right Carpal Tunnel Syndrome. Pt. Reports symptoms first started in 2020.  Pt. Has had multiple steroid injections, however has recently been to receive additional injection due to elevation in A1C. Pt.'s symptoms have worsened over time, and are now affecting her ability to perform daily tasks without pain. Pt. Currently has a wrist support brace. 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  PRECAUTIONS: None  RED FLAGS: None   WEIGHT BEARING RESTRICTIONS: No  PAIN:  Are you having pain? 6-7/10 pain at rest, and  with activity. With increases in pain when perfroming tasks like wiping counter surfaces, windows, and the car.  LIVING ENVIRONMENT: Lives with: lives with their family Lives in: House/apartment Stairs: 6 stairs, 1 and 1/2  levels   PLOF: Independent  PATIENT GOALS:  To decrease pain, and be able to use her right hand   OBJECTIVE:  Note: Objective measures were completed at Evaluation unless otherwise noted.  HAND DOMINANCE: Right  ADLs: MAM-20:  -Pt. Scored level 2 (very hard to do)-tie shoes, cut meat, wring towel, open medication bottle,  write 3-4 lines legibly, pick up a 1/2 full pitcher, turn a key. -Pt. Score level 3 (a little hard to do)-cut nails with a nail clipper, button clothes, take things out of wallet, open a previously opened wide mouth jar, handle money/coins, dial telephone numbers, wash hands, use both hands to eat.  Pt. Is unable to crochet, grip  phone, and hold pots and pans 2/2 pain.  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 [employer]  FUNCTIONAL OUTCOME MEASURES: MAM-20-58/80: see above  UPPER EXTREMITY ROM:     Active ROM Right Eval WFL Left Eval WFL  Shoulder flexion    Shoulder abduction    Shoulder adduction    Shoulder extension    Shoulder internal rotation    Shoulder external rotation    Elbow flexion    Elbow extension    Wrist flexion 56(64) 86  Wrist extension 54(72) 84  Wrist ulnar deviation 32 34  Wrist radial deviation 6 20  Wrist pronation 90 90  Wrist supination 70 90  (Blank rows = not tested)  Active ROM Right Eval WFL Left Eval WFL  Thumb MCP (0-60)    Thumb IP (0-80)    Thumb Radial abd/add (0-55)     Thumb Palmar abd/add (0-45)     Thumb Opposition to Small Finger     Index MCP (0-90)     Index PIP (0-100)     Index DIP (0-70)      Long MCP (0-90)      Long PIP (0-100)      Long DIP (0-70)      Ring MCP (0-90)      Ring PIP (0-100)      Ring DIP (0-70)      Little MCP (0-90)      Little PIP (0-100)      Little DIP (0-70)      (Blank rows = not tested)   UPPER EXTREMITY MMT:     MMT Right eval Left eval  Shoulder flexion [facility]  Shoulder abduction [facility]  Shoulder adduction    Shoulder extension    Shoulder internal rotation    Shoulder external rotation    Middle trapezius    Lower trapezius    Elbow flexion [facility] WFL  Elbow extension [facility] WFL  Wrist flexion 3-/5 WFL  Wrist extension 3-/5 WFL  Wrist ulnar deviation 4/5 WFL  Wrist radial deviation 3-/5 WFL  Wrist pronation    Wrist supination 3-/5 WFL  (Blank rows = not tested)  HAND FUNCTION: Grip strength: Right: 14 lbs; Left: 24 lbs, Lateral pinch: Right: 2 lbs, Left: 5 lbs, and 3 point pinch: Right: 1 lbs, Left: 4 lbs  COORDINATION:   SENSATION:  Tingling in the left thumb, 2nd,and 3rd digits.  EDEMA: N/A  COGNITION: Overall cognitive status: Within functional limits for tasks  assessed    TREATMENT DATE: 02/08/24  OT initial evaluation was completed, and Pt. education was provided as indicated below.      PATIENT EDUCATION: Education details: OT services, POC, goals and ADL/IADL functional Status.  Person educated: Patient Education method: Explanation, Demonstration, Tactile cues, and Verbal cues Education comprehension: verbalized understanding, returned demonstration, and needs further education  HOME EXERCISE PROGRAM:  Continue to assess ongoing need for HEPs, and provide/upgrade as indicated.   GOALS: Goals reviewed with patient? Yes  SHORT TERM GOALS: Target date: 03/31/2024    Pt. will be independent with HEPs for the right hand  Baseline: No current HEP Goal status: INITIAL   LONG TERM GOALS: Target date: 05/12/2024  Pt. will decrease pain by 3 points on the pain scale Baseline: Eval: 6-7/10 right wrist Goal status: INITIAL  2.  Pt. Will increase right wrist ROM to be to improve engagement in ADL/IADL tasks. Baseline: Eval: Wrist flexion: R: 56(64), L: 86; Wrist extension: R: 54(72), L: 84 Goal status: INITIAL  3.  Pt. Will improve right grip strength by 5# to be able to more securely hold pots/pans Baseline: Eval: R: 14#, L: 24# Goal status: INITIAL  4.  Pt. Will improve right pinch strength b 2# to be able open bottles and jars Baseline: Eval: lateral pinch: R: 2#, L: 5#, 3pt. Pinch: R: 1#, L: 4# Goal status: INITIAL  5.  Pt. Will recall and implement 3 compensatory/joint protection/work simplification strategies for ADLs. Baseline: Eval: Education to be provided Goal status: INITIAL  ASSESSMENT:  CLINICAL IMPRESSION: Patient is a 53 y.o. female who was seen today for occupational therapy evaluation for Right hand Carpal tunnel syndrome. Pt. Presents with 6-7/10 pain in the right wrist, limited  right  wrist ROM, and strength, decreased grip strength, and pinch strength. These limitations affect his ability to perfrom daily ADL, and IADL tasks including: using her right hand without pain, securely holding items, holding pots and pans, opening jars, and containers, cleaning items, driving, and crocheting. MAM-20: 58/80. Pt. Will benefit from skilled OT services to work on improving right hand functioning in order to improve, and maximize independence with ADLs, and IADL tasks.  PERFORMANCE DEFICITS: in functional skills including ADLs, IADLs, coordination, dexterity, proprioception, ROM, strength, pain, Fine motor control, Gross motor control, and UE functional use, and psychosocial skills including coping strategies, environmental adaptation, interpersonal interactions, and routines and behaviors.   IMPAIRMENTS: are limiting patient from ADLs, IADLs, and leisure.   COMORBIDITIES: may have co-morbidities  that affects occupational performance. Patient will benefit from skilled OT to address above impairments and improve overall function.  MODIFICATION OR ASSISTANCE TO COMPLETE EVALUATION: Min-Moderate modification of tasks or assist with assess necessary to complete an evaluation.  OT OCCUPATIONAL PROFILE AND HISTORY: Detailed assessment: Review of records and additional review of physical, cognitive, psychosocial history related to current functional performance.  CLINICAL DECISION MAKING: Moderate - several treatment options, min-mod task modification necessary  REHAB POTENTIAL: Good  EVALUATION COMPLEXITY: Moderate      PLAN:  OT FREQUENCY: 1-2x/week  OT DURATION: 12 weeks  PLANNED INTERVENTIONS: 97168 OT Re-evaluation, 97535 self care/ADL training, 02889 therapeutic exercise, 97530 therapeutic activity, 97112 neuromuscular re-education, 97140 manual therapy, 97018 paraffin, 02989 moist heat, 97010 cryotherapy, 97034 contrast bath, 97033 iontophoresis, 97760 Orthotic Initial, 97763  Orthotic/Prosthetic subsequent, energy conservation, patient/family education, and DME and/or AE instructions  RECOMMENDED OTHER SERVICES: N/A  CONSULTED AND AGREED WITH PLAN OF CARE: Patient  PLAN FOR NEXT SESSION: Treatment  Richardson Otter, MS, OTR/L   02/18/2024, 6:41 PM   "

## 2024-02-24 ENCOUNTER — Ambulatory Visit: Payer: PRIVATE HEALTH INSURANCE | Attending: Neurology | Admitting: Occupational Therapy

## 2024-02-24 DIAGNOSIS — M6281 Muscle weakness (generalized): Secondary | ICD-10-CM | POA: Insufficient documentation

## 2024-02-24 DIAGNOSIS — R278 Other lack of coordination: Secondary | ICD-10-CM | POA: Insufficient documentation

## 2024-02-24 DIAGNOSIS — G5601 Carpal tunnel syndrome, right upper limb: Secondary | ICD-10-CM | POA: Insufficient documentation

## 2024-02-24 NOTE — Therapy (Signed)
 " OUTPATIENT OCCUPATIONAL THERAPY ORTHO TREATMENT NOTE  Patient Name: Claire [name] MRN: [id number] DOB:1971-01-19, 54 y.o., female Today's Date: 02/24/2024  PCP: Glenard Mire, MD REFERRING PROVIDER: Maree Hila, MD  END OF SESSION:  OT End of Session - 02/24/24 1034     Visit Number 2    Number of Visits 24    Date for Recertification  05/12/24    OT Start Time 0930    OT Stop Time 1015    OT Time Calculation (min) 45 min    Activity Tolerance Patient tolerated treatment well    Behavior During Therapy [facility] for tasks assessed/performed          Past Medical History:  Diagnosis Date   Allergy    All on file   Anemia    Anxiety    Arthritis    hands, hips   Asthma    Bilateral leg cramps    Depression    Diabetes mellitus without complication (HCC)    history no longer a problem since weight loss surgery   DJD (degenerative joint disease)    Dyspnea on exertion    a. 07/2019 Echo: EF 60-65%, no rwma, nl RV fxn, triv MR.   GERD (gastroesophageal reflux disease)    Headache    Migraines   IBS (irritable bowel syndrome)    Kidney stone    Low serum vitamin B12    Lung nodules    Migraine    down to approx 4x/mo since starting meds   Muscle spasm    Nonobstructive CAD (coronary artery disease)    a. 06/2014 ETT: Ex time 8:59, max HR 155, 10.1 METS, no ST/T changes; b. 08/2019 Cor CTA: Ca2+ 175 (99th %'ile), LAD 35-49p, LCX 0-37m-->Med rx.   Oxygen deficiency [phone]   Due to COVID   Psoriasis    vaginal area   Seasonal allergies    Sleep apnea    history no longer a problem since weight loss surgery   Vasovagal syncope    Erin, Dr. Ferdinand    Vitamin D  deficiency    Past Surgical History:  Procedure Laterality Date   APPENDECTOMY     BLADDER SURGERY  2010   BREAST BIOPSY Right 2014   stereotatic biopsy   CHOLECYSTECTOMY  2010   COLONOSCOPY  2014   Done at [facility]   COLONOSCOPY WITH PROPOFOL  N/A 01/28/2018   Procedure: COLONOSCOPY WITH PROPOFOL ;   Surgeon: Janalyn Keene NOVAK, MD;  Location: [facility];  Service: Endoscopy;  Laterality: N/A;   DILITATION & CURRETTAGE/HYSTROSCOPY WITH NOVASURE ABLATION N/A 06/16/2015   Procedure: DILATATION & CURETTAGE/HYSTEROSCOPY WITH NOVASURE ABLATION;  Surgeon: Charlie Flowers, MD;  Location: [facility];  Service: Gynecology;  Laterality: N/A;   ESOPHAGOGASTRODUODENOSCOPY N/A 10/24/2023   Procedure: EGD (ESOPHAGOGASTRODUODENOSCOPY);  Surgeon: Therisa Bi, MD;  Location[facility];  Service: Gastroenterology;  Laterality: N/A;   ESOPHAGOGASTRODUODENOSCOPY (EGD) WITH PROPOFOL  N/A 01/28/2018   Procedure: ESOPHAGOGASTRODUODENOSCOPY (EGD) WITH BIOPSIES;  Surgeon: Janalyn Keene NOVAK, MD;  Location: [facility];  Service: Endoscopy;  Laterality: N/A;   GASTRIC ROUX-EN-Y N/A 11/03/2019   Procedure: LAPAROSCOPIC 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 WITH UPPER ENDOSCOPY;  Surgeon: Gladis Cough, MD;  Location: [facility];  Service: General;  Laterality: N/A;   HERNIA REPAIR  2012   HIATAL HERNIA REPAIR N/A 11/03/2019   Procedure: HERNIA REPAIR HIATAL;  Surgeon: Gladis Cough, MD;  Location: [facility];  Service: General;  Laterality: N/A;   LAPAROSCOPIC GASTRIC SLEEVE RESECTION  2012   LAPAROSCOPY N/A 06/16/2015   Procedure: LAPAROSCOPY DIAGNOSTIC;  Surgeon: Charlie Flowers, MD;  Location: [facility];  Service: Gynecology;  Laterality: N/A;   LAPAROSCOPY N/A 04/01/2018   Procedure: LAPAROSCOPY DIAGNOSTIC ERAS PATHWAY ENTEROLYSIS, CECOPEXY;  Surgeon: Gladis Cough, MD;  Location: [facility];  Service: General;  Laterality: N/A;   LITHOTRIPSY  12/11/2017   laser lithotripsy   LYSIS OF ADHESION N/A 06/16/2015   Procedure: LYSIS OF ADHESION;  Surgeon: Charlie Flowers, MD;  Location: [facility];  Service: Gynecology;  Laterality: N/A;   PLANTAR FASCIA SURGERY Bilateral    ROBOTIC ASSISTED SALPINGO OOPHERECTOMY Right 06/16/2015   Procedure: ROBOTIC ASSISTED SALPINGO OOPHORECTOMY,  EXCISION OF RIGHT CUL DE SAC MASS;  Surgeon: Charlie Flowers, MD;  Location: [facility];  Service: Gynecology;  Laterality: Right;   TONSILLECTOMY     UPPER GI ENDOSCOPY     Patient Active Problem List   Diagnosis Date Noted   Primary osteoarthritis of right hip 12/10/2023   Chronic right hip pain 10/23/2023   Spondylosis of lumbar region without myelopathy or radiculopathy 09/23/2023   Myofascial pain syndrome 08/12/2023   Chronic pain syndrome 08/12/2023   Right shoulder pain 08/12/2023   Spondylolisthesis of lumbar region 01/25/2023   Morbid obesity with BMI of 40.0-44.9, adult (HCC) 07/10/2021   ADD (attention deficit disorder) 07/10/2021   Type 2 diabetes mellitus with obesity 07/10/2021   History of thyroiditis 07/10/2021   Fibromyalgia 07/10/2021   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/03/2019   B12 deficiency 04/10/2018   Columnar epithelial-lined lower esophagus    Postprocedural disorder of digestive system    Esophageal dysphagia    Persistent mood (affective) disorder, unspecified 01/15/2018   Insomnia related to another mental disorder 01/15/2018   OCD (obsessive compulsive disorder) 10/29/2017   GAD (generalized anxiety disorder) 10/29/2017   Nephrolithiasis 10/22/2017   Atherosclerosis of aorta 12/21/2016   Hiatal hernia 08/17/2016   Diverticulosis of colon 08/17/2016   Lumbosacral pain 01/07/2015   Intertrigo 11/05/2014   Perennial allergic rhinitis 09/03/2014   Lung nodules 09/03/2014   Vitamin D  deficiency 09/03/2014   History of kidney stones 09/03/2014   History of iron deficiency anemia 09/03/2014   Mild persistent asthma without complication 11/21/2012   GERD without esophagitis 11/21/2012   History of hyperlipidemia 11/21/2012   Migraine with aura and without status migrainosus 11/21/2012   History of sleep apnea 11/21/2012   IBS (irritable bowel  syndrome) 08/15/2012    ONSET DATE: 2020  REFERRING DIAG: Right Carpal Tunnel Syndrome  THERAPY DIAG:  Muscle weakness (generalized)  Rationale for Evaluation and Treatment: Rehabilitation  SUBJECTIVE:   SUBJECTIVE STATEMENT:  Pt. reports that holding her new pots and pans has been difficult because they are heavier.  Pt accompanied by: self  PERTINENT HISTORY: Pt. Is 54 y.o. female who was referred for OT services for right Carpal Tunnel Syndrome. Pt. reports symptoms first started in 2020.  Pt. has had multiple steroid injections, however has recently been to receive additional injection due to elevation in A1C. Pt.'s symptoms have worsened over time, and are now affecting her ability to perform daily tasks without pain. Pt. Currently has a wrist support brace. 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  PRECAUTIONS: None  RED FLAGS: None   WEIGHT BEARING RESTRICTIONS: No  PAIN:  Are you having pain? 6/10 pain upon arrival, 4-5/10 pain following treatment  LIVING ENVIRONMENT: Lives with: lives with their family Lives in: House/apartment Stairs: 6 stairs, 1 and 1/2  levels   PLOF: Independent  PATIENT GOALS:  To decrease pain, and be able to use her right hand   OBJECTIVE:   Note: Objective measures were completed at Evaluation unless otherwise noted.  HAND DOMINANCE: Right  ADLs: MAM-20:  -Pt. Scored level 2 (very hard to do)-tie shoes, cut meat, wring towel, open medication bottle,  write 3-4 lines legibly, pick up a 1/2 full pitcher, turn a key. -Pt. Score level 3 (a little hard to do)-cut nails with a nail clipper, button clothes, take things out of wallet, open a previously opened wide mouth jar, handle money/coins, dial telephone numbers, wash hands, use both hands to eat.  Pt. Is unable to crochet, grip phone, and hold pots and pans  2/2 pain.  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 [employer]  FUNCTIONAL OUTCOME MEASURES: MAM-20-58/80: see above  UPPER EXTREMITY ROM:     Active ROM Right Eval WFL Left Eval WFL  Shoulder flexion    Shoulder abduction    Shoulder adduction    Shoulder extension    Shoulder internal rotation    Shoulder external rotation    Elbow flexion    Elbow extension    Wrist flexion 56(64) 86  Wrist extension 54(72) 84  Wrist ulnar deviation 32 34  Wrist radial deviation 6 20  Wrist pronation 90 90  Wrist supination 70 90  (Blank rows = not tested)  Active ROM Right Eval WFL Left Eval WFL  Thumb MCP (0-60)    Thumb IP (0-80)    Thumb Radial abd/add (0-55)     Thumb Palmar abd/add (0-45)     Thumb Opposition to Small Finger     Index MCP (0-90)     Index PIP (0-100)     Index DIP (0-70)      Long MCP (0-90)      Long PIP (0-100)      Long DIP (0-70)      Ring MCP (0-90)      Ring PIP (0-100)      Ring DIP (0-70)      Little MCP (0-90)      Little PIP (0-100)      Little DIP (0-70)      (Blank rows = not tested)   UPPER EXTREMITY MMT:     MMT Right eval Left eval  Shoulder flexion [facility]  Shoulder abduction [facility]  Shoulder adduction    Shoulder extension    Shoulder internal rotation    Shoulder external rotation    Middle trapezius    Lower trapezius    Elbow flexion [facility] WFL  Elbow extension [facility] WFL  Wrist flexion 3-/5 WFL  Wrist extension 3-/5 WFL  Wrist ulnar deviation 4/5 WFL  Wrist radial deviation 3-/5 WFL  Wrist pronation    Wrist supination 3-/5 WFL  (Blank rows = not tested)  HAND FUNCTION: Grip strength: Right: 14 lbs; Left: 24 lbs, Lateral pinch: Right: 2 lbs, Left: 5 lbs, and 3 point pinch: Right: 1 lbs, Left: 4 lbs  COORDINATION:   SENSATION:  Tingling in the left thumb, 2nd,and 3rd digits.  EDEMA: N/A  COGNITION: Overall cognitive status: Within functional limits for tasks assessed  TREATMENT DATE:  02/24/23  Contrast Bath:   Contrasting heat pack for 3 min. followed by cold pack for 1 min. for 3 trials ending with 3 min. of heat for a total of 15 min. to the Right hand 2/2 pain, edema, and stiffness. Contrast bath was performed in preparation for manual therapy, and there. Ex.     Manual Therapy:  -Pt. tolerated soft tissue massage to the volar surface of the right hand at the hypothenar eminence, thenar eminence, radial, and ulnar aspects of the wrist, and distal forearm of the right hand 2/2 pain.  -Pt. tolerated carpal spread stretches to the right hand. -Manual therapy was performed  to increase circulation, and to decrease edema, stiffness, and pain.  Therapeutic Activities:   -Reviewed median nerve glides, and tendon glide exercises with the right hand following a visual handout through [employer]. -Pt. education was provided about compensatory/joint protection/work simplification strategies for pushing up form a chair.  PATIENT EDUCATION: Education details:  Firefighter, Joint protection/work simplification techniques, median nerve, and tendon glides,  Person educated: Patient Education method: Explanation, Demonstration, Tactile cues, and Verbal cues Education comprehension: verbalized understanding, returned demonstration, and needs further education  HOME EXERCISE PROGRAM:  Continue to assess ongoing need for HEPs, and provide/upgrade as indicated.   GOALS: Goals reviewed with patient? Yes  SHORT TERM GOALS: Target date: 03/31/2024    Pt. will be independent with HEPs for the right hand  Baseline: No current HEP Goal status: INITIAL   LONG TERM GOALS: Target date: 05/12/2024  Pt. will decrease pain by 3 points on the pain scale Baseline: Eval: 6-7/10 right wrist Goal status: INITIAL  2.  Pt. Will increase right wrist ROM to be to improve  engagement in ADL/IADL tasks. Baseline: Eval: Wrist flexion: R: 56(64), L: 86; Wrist extension: R: 54(72), L: 84 Goal status: INITIAL  3.  Pt. Will improve right grip strength by 5# to be able to more securely hold pots/pans Baseline: Eval: R: 14#, L: 24# Goal status: INITIAL  4.  Pt. Will improve right pinch strength b 2# to be able open bottles and jars Baseline: Eval: lateral pinch: R: 2#, L: 5#, 3pt. Pinch: R: 1#, L: 4# Goal status: INITIAL  5.  Pt. Will recall and implement 3 compensatory/joint protection/work simplification strategies for ADLs. Baseline: Eval: Education to be provided Goal status: INITIAL  ASSESSMENT:  CLINICAL IMPRESSION:  Pt. presented with 6/10 pain in the right wrist upon arrival, improving to 4-5/10 following treatment. Pt. required cues for visual demonstration of each of the median nerve glides, and tendon glide exercises. Pt. Was able to return demonstration of the proper technique for each. Pt. Tolerated the contrast bath, manual therapy, and exercises well. Pt. continues to benefit from skilled OT services to work on improving right hand functioning in order to improve, and maximize independence with ADLs, and IADL tasks.  PERFORMANCE DEFICITS: in functional skills including ADLs, IADLs, coordination, dexterity, proprioception, ROM, strength, pain, Fine motor control, Gross motor control, and UE functional use, and psychosocial skills including coping strategies, environmental adaptation, interpersonal interactions, and routines and behaviors.   IMPAIRMENTS: are limiting patient from ADLs, IADLs, and leisure.   COMORBIDITIES: may have co-morbidities  that affects occupational performance. Patient will benefit from skilled OT to address above impairments and improve overall function.  MODIFICATION OR ASSISTANCE TO COMPLETE EVALUATION: Min-Moderate modification of tasks or assist with assess necessary to complete an evaluation.  OT OCCUPATIONAL PROFILE AND  HISTORY: Detailed assessment: Review of records and additional review of physical,  cognitive, psychosocial history related to current functional performance.  CLINICAL DECISION MAKING: Moderate - several treatment options, min-mod task modification necessary  REHAB POTENTIAL: Good  EVALUATION COMPLEXITY: Moderate      PLAN:  OT FREQUENCY: 1-2x/week  OT DURATION: 12 weeks  PLANNED INTERVENTIONS: 97168 OT Re-evaluation, 97535 self care/ADL training, 02889 therapeutic exercise, 97530 therapeutic activity, 97112 neuromuscular re-education, 97140 manual therapy, 97018 paraffin, 02989 moist heat, 97010 cryotherapy, 97034 contrast bath, 97033 iontophoresis, 97760 Orthotic Initial, 97763 Orthotic/Prosthetic subsequent, energy conservation, patient/family education, and DME and/or AE instructions  RECOMMENDED OTHER SERVICES: N/A  CONSULTED AND AGREED WITH PLAN OF CARE: Patient  PLAN FOR NEXT SESSION: Treatment  Richardson Otter, MS, OTR/L   02/24/2024, 10:38 AM   "

## 2024-02-26 ENCOUNTER — Ambulatory Visit: Payer: PRIVATE HEALTH INSURANCE

## 2024-02-26 DIAGNOSIS — R278 Other lack of coordination: Secondary | ICD-10-CM

## 2024-02-26 DIAGNOSIS — M6281 Muscle weakness (generalized): Secondary | ICD-10-CM

## 2024-02-26 NOTE — Therapy (Signed)
 " OUTPATIENT OCCUPATIONAL THERAPY ORTHO TREATMENT NOTE  Patient Name: Claire [name] MRN: [id number] DOB:Jul 31, 1970, 54 y.o., female Today's Date: 03/01/2024  PCP: Glenard Mire, MD REFERRING PROVIDER: Maree Hila, MD  END OF SESSION:  OT End of Session - 03/01/24 1259     Visit Number 3    Number of Visits 24    Date for Recertification  05/12/24    OT Start Time 1104    OT Stop Time 1145    OT Time Calculation (min) 41 min    Activity Tolerance Patient tolerated treatment well    Behavior During Therapy [facility] for tasks assessed/performed         Past Medical History:  Diagnosis Date   Allergy    All on file   Anemia    Anxiety    Arthritis    hands, hips   Asthma    Bilateral leg cramps    Depression    Diabetes mellitus without complication (HCC)    history no longer a problem since weight loss surgery   DJD (degenerative joint disease)    Dyspnea on exertion    a. 07/2019 Echo: EF 60-65%, no rwma, nl RV fxn, triv MR.   GERD (gastroesophageal reflux disease)    Headache    Migraines   IBS (irritable bowel syndrome)    Kidney stone    Low serum vitamin B12    Lung nodules    Migraine    down to approx 4x/mo since starting meds   Muscle spasm    Nonobstructive CAD (coronary artery disease)    a. 06/2014 ETT: Ex time 8:59, max HR 155, 10.1 METS, no ST/T changes; b. 08/2019 Cor CTA: Ca2+ 175 (99th %'ile), LAD 35-49p, LCX 0-14m-->Med rx.   Oxygen deficiency [phone]   Due to COVID   Psoriasis    vaginal area   Seasonal allergies    Sleep apnea    history no longer a problem since weight loss surgery   Vasovagal syncope    [place], Dr. Ferdinand    Vitamin D  deficiency    Past Surgical History:  Procedure Laterality Date   APPENDECTOMY     BLADDER SURGERY  2010   BREAST BIOPSY Right 2014   stereotatic biopsy   CHOLECYSTECTOMY  2010   COLONOSCOPY  2014   Done at [facility]   COLONOSCOPY WITH PROPOFOL  N/A 01/28/2018   Procedure: COLONOSCOPY WITH PROPOFOL ;   Surgeon: Janalyn Keene NOVAK, MD;  Location: [facility];  Service: Endoscopy;  Laterality: N/A;   DILITATION & CURRETTAGE/HYSTROSCOPY WITH NOVASURE ABLATION N/A 06/16/2015   Procedure: DILATATION & CURETTAGE/HYSTEROSCOPY WITH NOVASURE ABLATION;  Surgeon: Charlie Flowers, MD;  Location: [facility];  Service: Gynecology;  Laterality: N/A;   ESOPHAGOGASTRODUODENOSCOPY N/A 10/24/2023   Procedure: EGD (ESOPHAGOGASTRODUODENOSCOPY);  Surgeon: Therisa Bi, MD;  Location[facility];  Service: Gastroenterology;  Laterality: N/A;   ESOPHAGOGASTRODUODENOSCOPY (EGD) WITH PROPOFOL  N/A 01/28/2018   Procedure: ESOPHAGOGASTRODUODENOSCOPY (EGD) WITH BIOPSIES;  Surgeon: Janalyn Keene NOVAK, MD;  Location: [facility];  Service: Endoscopy;  Laterality: N/A;   GASTRIC ROUX-EN-Y N/A 11/03/2019   Procedure: LAPAROSCOPIC 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 WITH UPPER ENDOSCOPY;  Surgeon: Gladis Cough, MD;  Location: [facility];  Service: General;  Laterality: N/A;   HERNIA REPAIR  2012   HIATAL HERNIA REPAIR N/A 11/03/2019   Procedure: HERNIA REPAIR HIATAL;  Surgeon: Gladis Cough, MD;  Location: [facility];  Service: General;  Laterality: N/A;   LAPAROSCOPIC GASTRIC SLEEVE RESECTION  2012   LAPAROSCOPY N/A 06/16/2015   Procedure: LAPAROSCOPY DIAGNOSTIC;  Surgeon: Charlie Flowers, MD;  Location: [facility];  Service: Gynecology;  Laterality: N/A;   LAPAROSCOPY N/A 04/01/2018   Procedure: LAPAROSCOPY DIAGNOSTIC ERAS PATHWAY ENTEROLYSIS, CECOPEXY;  Surgeon: Gladis Cough, MD;  Location: [facility];  Service: General;  Laterality: N/A;   LITHOTRIPSY  12/11/2017   laser lithotripsy   LYSIS OF ADHESION N/A 06/16/2015   Procedure: LYSIS OF ADHESION;  Surgeon: Charlie Flowers, MD;  Location: [facility];  Service: Gynecology;  Laterality: N/A;   PLANTAR FASCIA SURGERY Bilateral    ROBOTIC ASSISTED SALPINGO OOPHERECTOMY Right 06/16/2015   Procedure: ROBOTIC ASSISTED SALPINGO OOPHORECTOMY,  EXCISION OF RIGHT CUL DE SAC MASS;  Surgeon: Charlie Flowers, MD;  Location: [facility];  Service: Gynecology;  Laterality: Right;   TONSILLECTOMY     UPPER GI ENDOSCOPY     Patient Active Problem List   Diagnosis Date Noted   Primary osteoarthritis of right hip 12/10/2023   Chronic right hip pain 10/23/2023   Spondylosis of lumbar region without myelopathy or radiculopathy 09/23/2023   Myofascial pain syndrome 08/12/2023   Chronic pain syndrome 08/12/2023   Right shoulder pain 08/12/2023   Spondylolisthesis of lumbar region 01/25/2023   Morbid obesity with BMI of 40.0-44.9, adult (HCC) 07/10/2021   ADD (attention deficit disorder) 07/10/2021   Type 2 diabetes mellitus with obesity 07/10/2021   History of thyroiditis 07/10/2021   Fibromyalgia 07/10/2021   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/03/2019   B12 deficiency 04/10/2018   Columnar epithelial-lined lower esophagus    Postprocedural disorder of digestive system    Esophageal dysphagia    Persistent mood (affective) disorder, unspecified 01/15/2018   Insomnia related to another mental disorder 01/15/2018   OCD (obsessive compulsive disorder) 10/29/2017   GAD (generalized anxiety disorder) 10/29/2017   Nephrolithiasis 10/22/2017   Atherosclerosis of aorta 12/21/2016   Hiatal hernia 08/17/2016   Diverticulosis of colon 08/17/2016   Lumbosacral pain 01/07/2015   Intertrigo 11/05/2014   Perennial allergic rhinitis 09/03/2014   Lung nodules 09/03/2014   Vitamin D  deficiency 09/03/2014   History of kidney stones 09/03/2014   History of iron deficiency anemia 09/03/2014   Mild persistent asthma without complication 11/21/2012   GERD without esophagitis 11/21/2012   History of hyperlipidemia 11/21/2012   Migraine with aura and without status migrainosus 11/21/2012   History of sleep apnea 11/21/2012   IBS (irritable bowel  syndrome) 08/15/2012   ONSET DATE: 2020  REFERRING DIAG: Right Carpal Tunnel Syndrome  THERAPY DIAG:  Muscle weakness (generalized)  Other lack of coordination  Rationale for Evaluation and Treatment: Rehabilitation  SUBJECTIVE:   SUBJECTIVE STATEMENT: Pt reports doing well today. Pt accompanied by: self  PERTINENT HISTORY: Pt. Is 54 y.o. female who was referred for OT services for right Carpal Tunnel Syndrome. Pt. reports symptoms first started in 2020.  Pt. has had multiple steroid injections, however has recently been to receive additional injection due to elevation in A1C. Pt.'s symptoms have worsened over time, and are now affecting her ability to perform daily tasks without pain. Pt. Currently has a wrist support brace. 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  PRECAUTIONS: None  RED FLAGS: None   WEIGHT BEARING RESTRICTIONS: No  PAIN: 02/26/24: R hand 7/10 Pain  Are you  having pain? 6/10 pain upon arrival, 4-5/10 pain following treatment  LIVING ENVIRONMENT: Lives with: lives with their family Lives in: House/apartment Stairs: 6 stairs, 1 and 1/2  levels  PLOF: Independent  PATIENT GOALS:  To decrease pain, and be able to use her right hand   OBJECTIVE:   Note: Objective measures were completed at Evaluation unless otherwise noted.  HAND DOMINANCE: Right  ADLs: MAM-20:  -Pt. Scored level 2 (very hard to do)-tie shoes, cut meat, wring towel, open medication bottle,  write 3-4 lines legibly, pick up a 1/2 full pitcher, turn a key. -Pt. Score level 3 (a little hard to do)-cut nails with a nail clipper, button clothes, take things out of wallet, open a previously opened wide mouth jar, handle money/coins, dial telephone numbers, wash hands, use both hands to eat.  Pt. Is unable to crochet, grip phone, and hold pots and pans 2/2  pain.  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 [employer]  FUNCTIONAL OUTCOME MEASURES: MAM-20-58/80: see above  UPPER EXTREMITY ROM:     Active ROM Right Eval WFL Left Eval WFL  Shoulder flexion    Shoulder abduction    Shoulder adduction    Shoulder extension    Shoulder internal rotation    Shoulder external rotation    Elbow flexion    Elbow extension    Wrist flexion 56(64) 86  Wrist extension 54(72) 84  Wrist ulnar deviation 32 34  Wrist radial deviation 6 20  Wrist pronation 90 90  Wrist supination 70 90  (Blank rows = not tested)  Active ROM Right Eval WFL Left Eval WFL  Thumb MCP (0-60)    Thumb IP (0-80)    Thumb Radial abd/add (0-55)     Thumb Palmar abd/add (0-45)     Thumb Opposition to Small Finger     Index MCP (0-90)     Index PIP (0-100)     Index DIP (0-70)      Long MCP (0-90)      Long PIP (0-100)      Long DIP (0-70)      Ring MCP (0-90)      Ring PIP (0-100)      Ring DIP (0-70)      Little MCP (0-90)      Little PIP (0-100)      Little DIP (0-70)      (Blank rows = not tested)  UPPER EXTREMITY MMT:     MMT Right eval Left eval  Shoulder flexion [facility]  Shoulder abduction [facility]  Shoulder adduction    Shoulder extension    Shoulder internal rotation    Shoulder external rotation    Middle trapezius    Lower trapezius    Elbow flexion [facility] WFL  Elbow extension [facility] WFL  Wrist flexion 3-/5 WFL  Wrist extension 3-/5 WFL  Wrist ulnar deviation 4/5 WFL  Wrist radial deviation 3-/5 WFL  Wrist pronation    Wrist supination 3-/5 WFL  (Blank rows = not tested)  HAND FUNCTION: Grip strength: Right: 14 lbs; Left: 24 lbs, Lateral pinch: Right: 2 lbs, Left: 5 lbs, and 3 point pinch: Right: 1 lbs, Left: 4 lbs  COORDINATION:   SENSATION:  Tingling in the left thumb, 2nd,and 3rd digits.  EDEMA: N/A  COGNITION: Overall cognitive status: Within functional limits for tasks assessed  TREATMENT DATE: 02/26/23  Paraffin:  X10 min for R hand pain management/muscle relaxation in prep for therapeutic exercises.  Therapeutic Exercise: -Issued pink theraputty and instructed pt in strengthening exercises for R/L hand, including gross grasping, lateral/2 point/3 point pinching, digit abd/add, and digging coins out of putty.  Able to return demo with intermittent min vc for technique to improve quality of movement.  Encouraged completion 5-10 min, 1-2x per day.   -Reviewed distal and proximal median nerve glides for RUE; issued visual handout and completed with min vc for technique.  Advised ok/beneficial to complete for BUEs.   Self Care: -Condition management education: Reviewed cumulative trauma prevention strategies/activity modifications, including maintaining neutral wrists while driving, sleeping, typing, and while picking up heavy ADL supplies (pots/pans/grocery bags).  Recommended work simplification/joint protection strategies, including pushing a cart rather than carrying a basket while grocery shopping, using rolled cart or shoulder bag to bring in grocery bags to house.   PATIENT EDUCATION: Education details:  Firefighter, Joint protection/work simplification techniques, median nerve, and tendon glides,  Person educated: Patient Education method: Explanation, Demonstration, Tactile cues, and Verbal cues Education comprehension: verbalized understanding, returned demonstration, and needs further education  HOME EXERCISE PROGRAM:  Continue to assess ongoing need for HEPs, and provide/upgrade as indicated.   GOALS: Goals reviewed with patient? Yes  SHORT TERM GOALS: Target date: 03/31/2024    Pt. will be independent with HEPs for the right hand  Baseline: No current HEP Goal status: INITIAL   LONG TERM GOALS: Target date: 05/12/2024  Pt. will decrease pain by 3 points  on the pain scale Baseline: Eval: 6-7/10 right wrist Goal status: INITIAL  2.  Pt. Will increase right wrist ROM to be to improve engagement in ADL/IADL tasks. Baseline: Eval: Wrist flexion: R: 56(64), L: 86; Wrist extension: R: 54(72), L: 84 Goal status: INITIAL  3.  Pt. Will improve right grip strength by 5# to be able to more securely hold pots/pans Baseline: Eval: R: 14#, L: 24# Goal status: INITIAL  4.  Pt. Will improve right pinch strength b 2# to be able open bottles and jars Baseline: Eval: lateral pinch: R: 2#, L: 5#, 3pt. Pinch: R: 1#, L: 4# Goal status: INITIAL  5.  Pt. Will recall and implement 3 compensatory/joint protection/work simplification strategies for ADLs. Baseline: Eval: Education to be provided Goal status: INITIAL  ASSESSMENT:  CLINICAL IMPRESSION: Pt with good tolerance to paraffin wax tx and above noted theraputty and median nerve gliding exercises with no increase in pain.  Pt reports proximal/full arm median nerve glide feels more effective than distal glide.  Pt reports improvement in pain by end of session, no number given but, feeling good.  Pt receptive to all education provided today; further review necessary to promote carry over into daily tasks.  Pt. continues to benefit from skilled OT services to work on improving right hand functioning in order to improve, and maximize independence with ADLs, and IADL tasks.  PERFORMANCE DEFICITS: in functional skills including ADLs, IADLs, coordination, dexterity, proprioception, ROM, strength, pain, Fine motor control, Gross motor control, and UE functional use, and psychosocial skills including coping strategies, environmental adaptation, interpersonal interactions, and routines and behaviors.   IMPAIRMENTS: are limiting patient from ADLs, IADLs, and leisure.   COMORBIDITIES: may have co-morbidities  that affects occupational performance. Patient will benefit from skilled OT to address above impairments and  improve overall function.  MODIFICATION OR ASSISTANCE TO COMPLETE EVALUATION: Min-Moderate modification of tasks or assist with assess necessary to complete an evaluation.  OT OCCUPATIONAL PROFILE AND HISTORY: Detailed assessment: Review of records and additional review of physical, cognitive, psychosocial history related to current functional performance.  CLINICAL DECISION MAKING: Moderate - several treatment options, min-mod task modification necessary  REHAB POTENTIAL: Good  EVALUATION COMPLEXITY: Moderate      PLAN:  OT FREQUENCY: 1-2x/week  OT DURATION: 12 weeks  PLANNED INTERVENTIONS: 97168 OT Re-evaluation, 97535 self care/ADL training, 02889 therapeutic exercise, 97530 therapeutic activity, 97112 neuromuscular re-education, 97140 manual therapy, 97018 paraffin, 02989 moist heat, 97010 cryotherapy, 97034 contrast bath, 97033 iontophoresis, 97760 Orthotic Initial, 97763 Orthotic/Prosthetic subsequent, energy conservation, patient/family education, and DME and/or AE instructions  RECOMMENDED OTHER SERVICES: N/A  CONSULTED AND AGREED WITH PLAN OF CARE: Patient  PLAN FOR NEXT SESSION: Treatment  Inocente Blazing, MS, OTR/L  03/01/2024, 1:01 PM   "

## 2024-03-04 ENCOUNTER — Ambulatory Visit: Payer: PRIVATE HEALTH INSURANCE | Admitting: Occupational Therapy

## 2024-03-04 DIAGNOSIS — R278 Other lack of coordination: Secondary | ICD-10-CM

## 2024-03-04 DIAGNOSIS — M6281 Muscle weakness (generalized): Secondary | ICD-10-CM

## 2024-03-04 NOTE — Therapy (Signed)
 " OUTPATIENT OCCUPATIONAL THERAPY ORTHO TREATMENT NOTE  Patient Name: Claire [name] MRN: [id number] DOB:1970/11/29, 54 y.o., female Today's Date: 03/04/2024  PCP: Glenard Mire, MD REFERRING PROVIDER: Maree Hila, MD  END OF SESSION:  OT End of Session - 03/04/24 1616     Visit Number 4    Number of Visits 24    Date for Recertification  05/12/24    OT Start Time 1105    OT Stop Time 1145    OT Time Calculation (min) 40 min    Activity Tolerance Patient tolerated treatment well    Behavior During Therapy [facility] for tasks assessed/performed         Past Medical History:  Diagnosis Date   Allergy    All on file   Anemia    Anxiety    Arthritis    hands, hips   Asthma    Bilateral leg cramps    Depression    Diabetes mellitus without complication (HCC)    history no longer a problem since weight loss surgery   DJD (degenerative joint disease)    Dyspnea on exertion    a. 07/2019 Echo: EF 60-65%, no rwma, nl RV fxn, triv MR.   GERD (gastroesophageal reflux disease)    Headache    Migraines   IBS (irritable bowel syndrome)    Kidney stone    Low serum vitamin B12    Lung nodules    Migraine    down to approx 4x/mo since starting meds   Muscle spasm    Nonobstructive CAD (coronary artery disease)    a. 06/2014 ETT: Ex time 8:59, max HR 155, 10.1 METS, no ST/T changes; b. 08/2019 Cor CTA: Ca2+ 175 (99th %'ile), LAD 35-49p, LCX 0-69m-->Med rx.   Oxygen deficiency [phone]   Due to COVID   Psoriasis    vaginal area   Seasonal allergies    Sleep apnea    history no longer a problem since weight loss surgery   Vasovagal syncope    , Dr. Ferdinand    Vitamin D  deficiency    Past Surgical History:  Procedure Laterality Date   APPENDECTOMY     BLADDER SURGERY  2010   BREAST BIOPSY Right 2014   stereotatic biopsy   CHOLECYSTECTOMY  2010   COLONOSCOPY  2014   Done at [facility]   COLONOSCOPY WITH PROPOFOL  N/A 01/28/2018   Procedure: COLONOSCOPY WITH PROPOFOL ;   Surgeon: Janalyn Keene NOVAK, MD;  [facility];  Service: Endoscopy;  Laterality: N/A;   DILITATION & CURRETTAGE/HYSTROSCOPY WITH NOVASURE ABLATION N/A 06/16/2015   Procedure: DILATATION & CURETTAGE/HYSTEROSCOPY WITH NOVASURE ABLATION;  Surgeon: Charlie Flowers, MD;  Location: [facility];  Service: Gynecology;  Laterality: N/A;   ESOPHAGOGASTRODUODENOSCOPY N/A 10/24/2023   Procedure: EGD (ESOPHAGOGASTRODUODENOSCOPY);  Surgeon: Therisa Bi, MD;  Location[facility] END[facility]COPY;  Service: Gastroenterology;  Laterality: N/A;   ESOPHAGOGASTRODUODENOSCOPY (EGD) WITH PROPOFOL  N/A 01/28/2018   Procedure: ESOPHAGOGASTRODUODENOSCOPY (EGD) WITH BIOPSIES;  Surgeon: Janalyn Keene NOVAK, MD;  Location: [facility];  Service: Endoscopy;  Laterality: N/A;   GASTRIC ROUX-EN-Y N/A 11/03/2019   Procedure: LAPAROSCOPIC 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 WITH UPPER ENDOSCOPY;  Surgeon: Gladis Cough, MD;  Location: [facility];  Service: General;  Laterality: N/A;   HERNIA REPAIR  2012   HIATAL HERNIA REPAIR N/A 11/03/2019   Procedure: HERNIA REPAIR HIATAL;  Surgeon: Gladis Cough, MD;  Location: [facility];  Service: General;  Laterality: N/A;   LAPAROSCOPIC GASTRIC SLEEVE RESECTION  2012   LAPAROSCOPY N/A 06/16/2015   Procedure: LAPAROSCOPY DIAGNOSTIC;  Surgeon: Charlie Flowers, MD;  Location: [facility];  Service: Gynecology;  Laterality: N/A;   LAPAROSCOPY N/A 04/01/2018   Procedure: LAPAROSCOPY DIAGNOSTIC ERAS PATHWAY ENTEROLYSIS, CECOPEXY;  Surgeon: Gladis Cough, MD;  Location: [facility];  Service: General;  Laterality: N/A;   LITHOTRIPSY  12/11/2017   laser lithotripsy   LYSIS OF ADHESION N/A 06/16/2015   Procedure: LYSIS OF ADHESION;  Surgeon: Charlie Flowers, MD;  Location: [facility];  Service: Gynecology;  Laterality: N/A;   PLANTAR FASCIA SURGERY Bilateral    ROBOTIC ASSISTED SALPINGO OOPHERECTOMY Right 06/16/2015   Procedure: ROBOTIC ASSISTED SALPINGO OOPHORECTOMY,  EXCISION OF RIGHT CUL DE SAC MASS;  Surgeon: Charlie Flowers, MD;  Location: [facility];  Service: Gynecology;  Laterality: Right;   TONSILLECTOMY     UPPER GI ENDOSCOPY     Patient Active Problem List   Diagnosis Date Noted   Primary osteoarthritis of right hip 12/10/2023   Chronic right hip pain 10/23/2023   Spondylosis of lumbar region without myelopathy or radiculopathy 09/23/2023   Myofascial pain syndrome 08/12/2023   Chronic pain syndrome 08/12/2023   Right shoulder pain 08/12/2023   Spondylolisthesis of lumbar region 01/25/2023   Morbid obesity with BMI of 40.0-44.9, adult (HCC) 07/10/2021   ADD (attention deficit disorder) 07/10/2021   Type 2 diabetes mellitus with obesity 07/10/2021   History of thyroiditis 07/10/2021   Fibromyalgia 07/10/2021   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/03/2019   B12 deficiency 04/10/2018   Columnar epithelial-lined lower esophagus    Postprocedural disorder of digestive system    Esophageal dysphagia    Persistent mood (affective) disorder, unspecified 01/15/2018   Insomnia related to another mental disorder 01/15/2018   OCD (obsessive compulsive disorder) 10/29/2017   GAD (generalized anxiety disorder) 10/29/2017   Nephrolithiasis 10/22/2017   Atherosclerosis of aorta 12/21/2016   Hiatal hernia 08/17/2016   Diverticulosis of colon 08/17/2016   Lumbosacral pain 01/07/2015   Intertrigo 11/05/2014   Perennial allergic rhinitis 09/03/2014   Lung nodules 09/03/2014   Vitamin D  deficiency 09/03/2014   History of kidney stones 09/03/2014   History of iron deficiency anemia 09/03/2014   Mild persistent asthma without complication 11/21/2012   GERD without esophagitis 11/21/2012   History of hyperlipidemia 11/21/2012   Migraine with aura and without status migrainosus 11/21/2012   History of sleep apnea 11/21/2012   IBS (irritable bowel  syndrome) 08/15/2012   ONSET DATE: 2020  REFERRING DIAG: Right Carpal Tunnel Syndrome  THERAPY DIAG:  Muscle weakness (generalized)  Other lack of coordination  Rationale for Evaluation and Treatment: Rehabilitation  SUBJECTIVE:   SUBJECTIVE STATEMENT: Pt reports doing well today. Pt accompanied by: self  PERTINENT HISTORY: Pt. Is 54 y.o. female who was referred for OT services for right Carpal Tunnel Syndrome. Pt. reports symptoms first started in 2020.  Pt. has had multiple steroid injections, however has recently been to receive additional injection due to elevation in A1C. Pt.'s symptoms have worsened over time, and are now affecting her ability to perform daily tasks without pain. Pt. Currently has a wrist support brace. 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  PRECAUTIONS: None  RED FLAGS: None   WEIGHT BEARING RESTRICTIONS: No  PAIN:  03/04/24: Right hand pain 7-8/10 02/26/24: R  hand 7/10 Pain  Are you having pain? 6/10 pain upon arrival, 4-5/10 pain following treatment  LIVING ENVIRONMENT: Lives with: lives with their family Lives in: House/apartment Stairs: 6 stairs, 1 and 1/2  levels  PLOF: Independent  PATIENT GOALS:  To decrease pain, and be able to use her right hand   OBJECTIVE:   Note: Objective measures were completed at Evaluation unless otherwise noted.  HAND DOMINANCE: Right  ADLs: MAM-20:  -Pt. Scored level 2 (very hard to do)-tie shoes, cut meat, wring towel, open medication bottle,  write 3-4 lines legibly, pick up a 1/2 full pitcher, turn a key. -Pt. Score level 3 (a little hard to do)-cut nails with a nail clipper, button clothes, take things out of wallet, open a previously opened wide mouth jar, handle money/coins, dial telephone numbers, wash hands, use both hands to eat.  Pt. Is unable to crochet, grip phone, and  hold pots and pans 2/2 pain.  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 [employer]  FUNCTIONAL OUTCOME MEASURES: MAM-20-58/80: see above  UPPER EXTREMITY ROM:     Active ROM Right Eval WFL Left Eval WFL  Shoulder flexion    Shoulder abduction    Shoulder adduction    Shoulder extension    Shoulder internal rotation    Shoulder external rotation    Elbow flexion    Elbow extension    Wrist flexion 56(64) 86  Wrist extension 54(72) 84  Wrist ulnar deviation 32 34  Wrist radial deviation 6 20  Wrist pronation 90 90  Wrist supination 70 90  (Blank rows = not tested)  Active ROM Right Eval WFL Left Eval WFL  Thumb MCP (0-60)    Thumb IP (0-80)    Thumb Radial abd/add (0-55)     Thumb Palmar abd/add (0-45)     Thumb Opposition to Small Finger     Index MCP (0-90)     Index PIP (0-100)     Index DIP (0-70)      Long MCP (0-90)      Long PIP (0-100)      Long DIP (0-70)      Ring MCP (0-90)      Ring PIP (0-100)      Ring DIP (0-70)      Little MCP (0-90)      Little PIP (0-100)      Little DIP (0-70)      (Blank rows = not tested)  UPPER EXTREMITY MMT:     MMT Right eval Left eval  Shoulder flexion [facility]  Shoulder abduction [facility]  Shoulder adduction    Shoulder extension    Shoulder internal rotation    Shoulder external rotation    Middle trapezius    Lower trapezius    Elbow flexion [facility] WFL  Elbow extension [facility] WFL  Wrist flexion 3-/5 WFL  Wrist extension 3-/5 WFL  Wrist ulnar deviation 4/5 WFL  Wrist radial deviation 3-/5 WFL  Wrist pronation    Wrist supination 3-/5 WFL  (Blank rows = not tested)  HAND FUNCTION: Grip strength: Right: 14 lbs; Left: 24 lbs, Lateral pinch: Right: 2 lbs, Left: 5 lbs, and 3 point pinch: Right: 1 lbs, Left: 4 lbs  COORDINATION:   SENSATION:  Tingling in the left thumb, 2nd,and 3rd digits.  EDEMA: N/A  COGNITION: Overall cognitive status: Within functional limits for tasks  assessed  TREATMENT DATE: 03/05/23  Contrast Bath    Contrasting heat pack for 3 min. followed by cold pack for 1 min. for 3 trials ending with 3 min. of heat for a total of 15 min. to the Right hand 2/2 pain, edema, and stiffness. Contrast bath was performed in preparation for manual therapy, and there. Ex.      Manual Therapy:   -Pt. tolerated soft tissue massage to the volar surface of the right hand at the hypothenar eminence, thenar eminence, radial, and ulnar aspects of the wrist, and distal forearm of the right hand 2/2 pain.  -Pt. tolerated carpal spread stretches to the right hand. -Manual therapy was performed  to increase circulation, and to decrease edema, stiffness, and pain.   Therapeutic Activities:    -Reviewed pink level resistive block, and yellow theraputty for a  slow progression of strengthening for gross grip strengthening, lateral and 3pt. Pinch strengthening. yellow theraputty for a  slow progression of strengthening.   PATIENT EDUCATION: Education details:  Firefighter, Joint protection/work simplification techniques, median nerve, and tendon glides,  HEP modification 2/2 pain Person educated: Patient Education method: Explanation, Demonstration, Tactile cues, and Verbal cues Education comprehension: verbalized understanding, returned demonstration, and needs further education  HOME EXERCISE PROGRAM:  Pink theraputty HEP was modified to:   -pink resistive foam block for gross grip, lateral, and 3pt. Pinch strengthening, progressing to yellow theraputty.  GOALS: Goals reviewed with patient? Yes  SHORT TERM GOALS: Target date: 03/31/2024    Pt. will be independent with HEPs for the right hand  Baseline: No current HEP Goal status: INITIAL   LONG TERM GOALS: Target date: 05/12/2024  Pt. will decrease pain by 3 points on the pain  scale Baseline: Eval: 6-7/10 right wrist Goal status: INITIAL  2.  Pt. Will increase right wrist ROM to be to improve engagement in ADL/IADL tasks. Baseline: Eval: Wrist flexion: R: 56(64), L: 86; Wrist extension: R: 54(72), L: 84 Goal status: INITIAL  3.  Pt. Will improve right grip strength by 5# to be able to more securely hold pots/pans Baseline: Eval: R: 14#, L: 24# Goal status: INITIAL  4.  Pt. Will improve right pinch strength b 2# to be able open bottles and jars Baseline: Eval: lateral pinch: R: 2#, L: 5#, 3pt. Pinch: R: 1#, L: 4# Goal status: INITIAL  5.  Pt. Will recall and implement 3 compensatory/joint protection/work simplification strategies for ADLs. Baseline: Eval: Education to be provided Goal status: INITIAL  ASSESSMENT:  CLINICAL IMPRESSION:  Pt. reports having increased pain with the pink theraputty home exercises. The HEP was modified with pink level resistive block progressing to yellow theraputty if tolerated, and reassess at the therapy visit next week. Pt. presents with increased pain today today initially, however responded well to the contrasting heat/cold, and manual therapy. Pt. continues to benefit from skilled OT services to work on improving right hand functioning in order to improve, and maximize independence with ADLs, and IADL tasks.  PERFORMANCE DEFICITS: in functional skills including ADLs, IADLs, coordination, dexterity, proprioception, ROM, strength, pain, Fine motor control, Gross motor control, and UE functional use, and psychosocial skills including coping strategies, environmental adaptation, interpersonal interactions, and routines and behaviors.   IMPAIRMENTS: are limiting patient from ADLs, IADLs, and leisure.   COMORBIDITIES: may have co-morbidities  that affects occupational performance. Patient will benefit from skilled OT to address above impairments and improve overall function.  MODIFICATION OR ASSISTANCE TO COMPLETE EVALUATION:  Min-Moderate modification of tasks or assist with assess necessary to  complete an evaluation.  OT OCCUPATIONAL PROFILE AND HISTORY: Detailed assessment: Review of records and additional review of physical, cognitive, psychosocial history related to current functional performance.  CLINICAL DECISION MAKING: Moderate - several treatment options, min-mod task modification necessary  REHAB POTENTIAL: Good  EVALUATION COMPLEXITY: Moderate      PLAN:  OT FREQUENCY: 1-2x/week  OT DURATION: 12 weeks  PLANNED INTERVENTIONS: 97168 OT Re-evaluation, 97535 self care/ADL training, 02889 therapeutic exercise, 97530 therapeutic activity, 97112 neuromuscular re-education, 97140 manual therapy, 97018 paraffin, 02989 moist heat, 97010 cryotherapy, 97034 contrast bath, 97033 iontophoresis, 97760 Orthotic Initial, 97763 Orthotic/Prosthetic subsequent, energy conservation, patient/family education, and DME and/or AE instructions  RECOMMENDED OTHER SERVICES: N/A  CONSULTED AND AGREED WITH PLAN OF CARE: Patient  PLAN FOR NEXT SESSION: Treatment  Richardson Otter, MS, OTR/L   03/04/2024, 4:23 PM   "

## 2024-03-05 ENCOUNTER — Other Ambulatory Visit: Payer: Self-pay | Admitting: Cardiology

## 2024-03-10 ENCOUNTER — Ambulatory Visit: Payer: PRIVATE HEALTH INSURANCE

## 2024-03-10 DIAGNOSIS — M6281 Muscle weakness (generalized): Secondary | ICD-10-CM

## 2024-03-10 DIAGNOSIS — R278 Other lack of coordination: Secondary | ICD-10-CM

## 2024-03-10 NOTE — Therapy (Unsigned)
 " OUTPATIENT OCCUPATIONAL THERAPY ORTHO TREATMENT NOTE  Patient Name: Claire [name] MRN: [id number] DOB:December 25, 1970, 54 y.o., female Today's Date: 03/11/2024  PCP: Glenard Mire, MD REFERRING PROVIDER: Maree Hila, MD  END OF SESSION:  OT End of Session - 03/11/24 1949     Visit Number 5    Number of Visits 24    Date for Recertification  05/12/24    OT Start Time 1017    OT Stop Time 1100    OT Time Calculation (min) 43 min    Activity Tolerance Patient tolerated treatment well    Behavior During Therapy [facility] for tasks assessed/performed         Past Medical History:  Diagnosis Date   Allergy    All on file   Anemia    Anxiety    Arthritis    hands, hips   Asthma    Bilateral leg cramps    Depression    Diabetes mellitus without complication (HCC)    history no longer a problem since weight loss surgery   DJD (degenerative joint disease)    Dyspnea on exertion    a. 07/2019 Echo: EF 60-65%, no rwma, nl RV fxn, triv MR.   GERD (gastroesophageal reflux disease)    Headache    Migraines   IBS (irritable bowel syndrome)    Kidney stone    Low serum vitamin B12    Lung nodules    Migraine    down to approx 4x/mo since starting meds   Muscle spasm    Nonobstructive CAD (coronary artery disease)    a. 06/2014 ETT: Ex time 8:59, max HR 155, 10.1 METS, no ST/T changes; b. 08/2019 Cor CTA: Ca2+ 175 (99th %'ile), LAD 35-49p, LCX 0-71m-->Med rx.   Oxygen deficiency [phone]   Due to COVID   Psoriasis    vaginal area   Seasonal allergies    Sleep apnea    history no longer a problem since weight loss surgery   Vasovagal syncope    Shirley, Dr. Ferdinand    Vitamin D  deficiency    Past Surgical History:  Procedure Laterality Date   APPENDECTOMY     BLADDER SURGERY  2010   BREAST BIOPSY Right 2014   stereotatic biopsy   CHOLECYSTECTOMY  2010   COLONOSCOPY  2014   Done at [facility]   COLONOSCOPY WITH PROPOFOL  N/A 01/28/2018   Procedure: COLONOSCOPY WITH PROPOFOL ;   Surgeon: Janalyn Keene NOVAK, MD;  Location: [facility];  Service: Endoscopy;  Laterality: N/A;   DILITATION & CURRETTAGE/HYSTROSCOPY WITH NOVASURE ABLATION N/A 06/16/2015   Procedure: DILATATION & CURETTAGE/HYSTEROSCOPY WITH NOVASURE ABLATION;  Surgeon: Charlie Flowers, MD;  Location: [facility];  Service: Gynecology;  Laterality: N/A;   ESOPHAGOGASTRODUODENOSCOPY N/A 10/24/2023   Procedure: EGD (ESOPHAGOGASTRODUODENOSCOPY);  Surgeon: Therisa Bi, MD;  Location: [facility];  Service: Gastroenterology;  Laterality: N/A;   ESOPHAGOGASTRODUODENOSCOPY (EGD) WITH PROPOFOL  N/A 01/28/2018   Procedure: ESOPHAGOGASTRODUODENOSCOPY (EGD) WITH BIOPSIES;  Surgeon: Janalyn Keene NOVAK, MD;  Location: [facility];  Service: Endoscopy;  Laterality: N/A;   GASTRIC ROUX-EN-Y N/A 11/03/2019   Procedure: LAPAROSCOPIC 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 WITH UPPER ENDOSCOPY;  Surgeon: Gladis Cough, MD;  Location: [facility];  Service: General;  Laterality: N/A;   HERNIA REPAIR  2012   HIATAL HERNIA REPAIR N/A 11/03/2019   Procedure: HERNIA REPAIR HIATAL;  Surgeon: Gladis Cough, MD;  Location: [facility];  Service: General;  Laterality: N/A;   LAPAROSCOPIC GASTRIC SLEEVE RESECTION  2012   LAPAROSCOPY N/A 06/16/2015   Procedure: LAPAROSCOPY DIAGNOSTIC;  Surgeon: Charlie Flowers, MD;  Location: [facility];  Service: Gynecology;  Laterality: N/A;   LAPAROSCOPY N/A 04/01/2018   Procedure: LAPAROSCOPY DIAGNOSTIC ERAS PATHWAY ENTEROLYSIS, CECOPEXY;  Surgeon: Gladis Cough, MD;  Location: [facility];  Service: General;  Laterality: N/A;   LITHOTRIPSY  12/11/2017   laser lithotripsy   LYSIS OF ADHESION N/A 06/16/2015   Procedure: LYSIS OF ADHESION;  Surgeon: Charlie Flowers, MD;  Location: [facility];  Service: Gynecology;  Laterality: N/A;   PLANTAR FASCIA SURGERY Bilateral    ROBOTIC ASSISTED SALPINGO OOPHERECTOMY Right 06/16/2015   Procedure: ROBOTIC ASSISTED SALPINGO OOPHORECTOMY,  EXCISION OF RIGHT CUL DE SAC MASS;  Surgeon: Charlie Flowers, MD;  Location: [facility];  Service: Gynecology;  Laterality: Right;   TONSILLECTOMY     UPPER GI ENDOSCOPY     Patient Active Problem List   Diagnosis Date Noted   Primary osteoarthritis of right hip 12/10/2023   Chronic right hip pain 10/23/2023   Spondylosis of lumbar region without myelopathy or radiculopathy 09/23/2023   Myofascial pain syndrome 08/12/2023   Chronic pain syndrome 08/12/2023   Right shoulder pain 08/12/2023   Spondylolisthesis of lumbar region 01/25/2023   Morbid obesity with BMI of 40.0-44.9, adult (HCC) 07/10/2021   ADD (attention deficit disorder) 07/10/2021   Type 2 diabetes mellitus with obesity 07/10/2021   History of thyroiditis 07/10/2021   Fibromyalgia 07/10/2021   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/03/2019   B12 deficiency 04/10/2018   Columnar epithelial-lined lower esophagus    Postprocedural disorder of digestive system    Esophageal dysphagia    Persistent mood (affective) disorder, unspecified 01/15/2018   Insomnia related to another mental disorder 01/15/2018   OCD (obsessive compulsive disorder) 10/29/2017   GAD (generalized anxiety disorder) 10/29/2017   Nephrolithiasis 10/22/2017   Atherosclerosis of aorta 12/21/2016   Hiatal hernia 08/17/2016   Diverticulosis of colon 08/17/2016   Lumbosacral pain 01/07/2015   Intertrigo 11/05/2014   Perennial allergic rhinitis 09/03/2014   Lung nodules 09/03/2014   Vitamin D  deficiency 09/03/2014   History of kidney stones 09/03/2014   History of iron deficiency anemia 09/03/2014   Mild persistent asthma without complication 11/21/2012   GERD without esophagitis 11/21/2012   History of hyperlipidemia 11/21/2012   Migraine with aura and without status migrainosus 11/21/2012   History of sleep apnea 11/21/2012   IBS (irritable bowel  syndrome) 08/15/2012   ONSET DATE: 2020  REFERRING DIAG: Right Carpal Tunnel Syndrome  THERAPY DIAG:  Muscle weakness (generalized)  Other lack of coordination  Rationale for Evaluation and Treatment: Rehabilitation  SUBJECTIVE:   SUBJECTIVE STATEMENT: Pt reports increased pain in the hand today. Pt accompanied by: self  PERTINENT HISTORY: Pt. Is 54 y.o. female who was referred for OT services for right Carpal Tunnel Syndrome. Pt. reports symptoms first started in 2020.  Pt. has had multiple steroid injections, however has recently been to receive additional injection due to elevation in A1C. Pt.'s symptoms have worsened over time, and are now affecting her ability to perform daily tasks without pain. Pt. Currently has a wrist support brace. 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  PRECAUTIONS: None  RED FLAGS: None   WEIGHT BEARING RESTRICTIONS: No  PAIN:  03/10/24: R hand pain  7/10  03/04/24: Right hand pain 7-8/10 02/26/24: R hand 7/10 Pain  Are you having pain? 6/10 pain upon arrival, 4-5/10 pain following treatment  LIVING ENVIRONMENT: Lives with: lives with their family Lives in: House/apartment Stairs: 6 stairs, 1 and 1/2  levels  PLOF: Independent  PATIENT GOALS:  To decrease pain, and be able to use her right hand   OBJECTIVE:   Note: Objective measures were completed at Evaluation unless otherwise noted.  HAND DOMINANCE: Right  ADLs: MAM-20:  -Pt. Scored level 2 (very hard to do)-tie shoes, cut meat, wring towel, open medication bottle,  write 3-4 lines legibly, pick up a 1/2 full pitcher, turn a key. -Pt. Score level 3 (a little hard to do)-cut nails with a nail clipper, button clothes, take things out of wallet, open a previously opened wide mouth jar, handle money/coins, dial telephone numbers, wash hands, use both hands to  eat.  Pt. Is unable to crochet, grip phone, and hold pots and pans 2/2 pain.  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 [employer]  FUNCTIONAL OUTCOME MEASURES: MAM-20-58/80: see above  UPPER EXTREMITY ROM:     Active ROM Right Eval WFL Left Eval WFL  Shoulder flexion    Shoulder abduction    Shoulder adduction    Shoulder extension    Shoulder internal rotation    Shoulder external rotation    Elbow flexion    Elbow extension    Wrist flexion 56(64) 86  Wrist extension 54(72) 84  Wrist ulnar deviation 32 34  Wrist radial deviation 6 20  Wrist pronation 90 90  Wrist supination 70 90  (Blank rows = not tested)  Active ROM Right Eval WFL Left Eval WFL  Thumb MCP (0-60)    Thumb IP (0-80)    Thumb Radial abd/add (0-55)     Thumb Palmar abd/add (0-45)     Thumb Opposition to Small Finger     Index MCP (0-90)     Index PIP (0-100)     Index DIP (0-70)      Long MCP (0-90)      Long PIP (0-100)      Long DIP (0-70)      Ring MCP (0-90)      Ring PIP (0-100)      Ring DIP (0-70)      Little MCP (0-90)      Little PIP (0-100)      Little DIP (0-70)      (Blank rows = not tested)  UPPER EXTREMITY MMT:     MMT Right eval Left eval  Shoulder flexion [facility]  Shoulder abduction [facility]  Shoulder adduction    Shoulder extension    Shoulder internal rotation    Shoulder external rotation    Middle trapezius    Lower trapezius    Elbow flexion [facility] WFL  Elbow extension [facility] WFL  Wrist flexion 3-/5 WFL  Wrist extension 3-/5 WFL  Wrist ulnar deviation 4/5 WFL  Wrist radial deviation 3-/5 WFL  Wrist pronation    Wrist supination 3-/5 WFL  (Blank rows = not tested)  HAND FUNCTION: Grip strength: Right: 14 lbs; Left: 24 lbs, Lateral pinch: Right: 2 lbs, Left: 5 lbs, and 3 point pinch: Right: 1 lbs, Left: 4 lbs  COORDINATION:   SENSATION:  Tingling in the left thumb, 2nd,and 3rd digits.  EDEMA: N/A  COGNITION: Overall cognitive  status: Within functional limits for tasks assessed  TREATMENT DATE: 03/11/23 Paraffin:  X10  min for R hand pain management/muscle relaxation in prep for therapeutic exercises.  Manual Therapy: -Ice massage x4 min to carpal tunnel, thenar and hypothenar eminence, volar wrist, volar palm for edema reduction -STM to volar wrist and hand, using Stickon bar for deeper tissue massage, short strokes, specifically to thenar and hypothenar eminence, carpal tunnel/volar wrist, working to reduce fascial restrictions, increase tissue extensibility, and increase circulation.  Therapeutic Exercise: -Passive wrist flex/ext, RD/UD with slight joint distraction -Passive radial abduction at the thumb with manual stretch to reduce stiffness within thenar eminence. -Review of distal median nerve glide; min vc with return demo -Review of strategies for grading putty up/down (reduce amount of putty being squeezed/pinched, reduce time/duration of use)                                                                                                                            PATIENT EDUCATION: Education details: HEP Person educated: Patient Education method: Programmer, Multimedia, Demonstration, and Verbal cues Education comprehension: verbalized understanding, returned demonstration, and needs further education  HOME EXERCISE PROGRAM:  Pink theraputty HEP was modified to:   -pink resistive foam block for gross grip, lateral, and 3pt. Pinch strengthening, progressing to yellow theraputty.  GOALS: Goals reviewed with patient? Yes  SHORT TERM GOALS: Target date: 03/31/2024    Pt. will be independent with HEPs for the right hand  Baseline: No current HEP Goal status: INITIAL   LONG TERM GOALS: Target date: 05/12/2024  Pt. will decrease pain by 3 points on the pain scale Baseline: Eval: 6-7/10 right wrist Goal status: INITIAL  2.  Pt. Will increase right wrist ROM to be to improve engagement in ADL/IADL  tasks. Baseline: Eval: Wrist flexion: R: 56(64), L: 86; Wrist extension: R: 54(72), L: 84 Goal status: INITIAL  3.  Pt. Will improve right grip strength by 5# to be able to more securely hold pots/pans Baseline: Eval: R: 14#, L: 24# Goal status: INITIAL  4.  Pt. Will improve right pinch strength b 2# to be able open bottles and jars Baseline: Eval: lateral pinch: R: 2#, L: 5#, 3pt. Pinch: R: 1#, L: 4# Goal status: INITIAL  5.  Pt. Will recall and implement 3 compensatory/joint protection/work simplification strategies for ADLs. Baseline: Eval: Education to be provided Goal status: INITIAL  ASSESSMENT:  CLINICAL IMPRESSION: Pt beginning with 7/10 pain in R hand at start of session, reducing to 5/10 pain by end of session with use of paraffin wax tx, manual therapy, and therapeutic exercises noted above.  Pt initially hypersensitive with light touch to R carpal tunnel and thenar eminence.  Pt pointed to hardened tissue along thenar crease, palpable by therapist as compared to opposite hand. OT able to decrease stiffness and hypersensitivity to this region after ice cube massage and use of Stickon bar for deep tissue massage as noted above.  Pt verbalized good tolerance to this and pt denied sensitivity with light touch by end of session.  Pt reports  she continues to monitor wrist position when using her work laptop during the day, being mindful of neutral wrist positions.  Pt. continues to benefit from skilled OT services to work on improving right hand functioning in order to improve, and maximize independence with ADLs, and IADL tasks.  PERFORMANCE DEFICITS: in functional skills including ADLs, IADLs, coordination, dexterity, proprioception, ROM, strength, pain, Fine motor control, Gross motor control, and UE functional use, and psychosocial skills including coping strategies, environmental adaptation, interpersonal interactions, and routines and behaviors.   IMPAIRMENTS: are limiting patient  from ADLs, IADLs, and leisure.   COMORBIDITIES: may have co-morbidities  that affects occupational performance. Patient will benefit from skilled OT to address above impairments and improve overall function.  MODIFICATION OR ASSISTANCE TO COMPLETE EVALUATION: Min-Moderate modification of tasks or assist with assess necessary to complete an evaluation.  OT OCCUPATIONAL PROFILE AND HISTORY: Detailed assessment: Review of records and additional review of physical, cognitive, psychosocial history related to current functional performance.  CLINICAL DECISION MAKING: Moderate - several treatment options, min-mod task modification necessary  REHAB POTENTIAL: Good  EVALUATION COMPLEXITY: Moderate      PLAN:  OT FREQUENCY: 1-2x/week  OT DURATION: 12 weeks  PLANNED INTERVENTIONS: 97168 OT Re-evaluation, 97535 self care/ADL training, 02889 therapeutic exercise, 97530 therapeutic activity, 97112 neuromuscular re-education, 97140 manual therapy, 97018 paraffin, 02989 moist heat, 97010 cryotherapy, 97034 contrast bath, 97033 iontophoresis, 97760 Orthotic Initial, 97763 Orthotic/Prosthetic subsequent, energy conservation, patient/family education, and DME and/or AE instructions  RECOMMENDED OTHER SERVICES: N/A  CONSULTED AND AGREED WITH PLAN OF CARE: Patient  PLAN FOR NEXT SESSION: Treatment  Inocente Blazing, MS, OTR/L   03/11/2024, 7:51 PM   "

## 2024-03-11 ENCOUNTER — Other Ambulatory Visit: Payer: Self-pay | Admitting: Family Medicine

## 2024-03-11 DIAGNOSIS — J4530 Mild persistent asthma, uncomplicated: Secondary | ICD-10-CM

## 2024-03-11 NOTE — Telephone Encounter (Signed)
 Requested Prescriptions  Pending Prescriptions Disp Refills   montelukast  (SINGULAIR ) 10 MG tablet [Pharmacy Med Name: MONTELUKAST  SOD 10 MG TABLET] 90 tablet 0    Sig: TAKE 1 TABLET (10 MG TOTAL) BY MOUTH AT BEDTIME. TAKE 1 TABLET(10 MG) BY MOUTH AT BEDTIME     Pulmonology:  Leukotriene Inhibitors Passed - 03/11/2024 11:47 AM      Passed - Valid encounter within last 12 months    Recent Outpatient Visits           2 months ago Well adult exam   [facility] Glenard Mire, MD   4 months ago Atherosclerosis of aorta   [facility] Glenard Mire, MD   10 months ago Type 2 diabetes mellitus with cardiac complication [facility])   [facility] Sowles, Krichna, MD

## 2024-03-13 ENCOUNTER — Ambulatory Visit: Payer: PRIVATE HEALTH INSURANCE

## 2024-03-13 DIAGNOSIS — M6281 Muscle weakness (generalized): Secondary | ICD-10-CM

## 2024-03-13 DIAGNOSIS — R278 Other lack of coordination: Secondary | ICD-10-CM

## 2024-03-13 DIAGNOSIS — G5601 Carpal tunnel syndrome, right upper limb: Secondary | ICD-10-CM

## 2024-03-13 NOTE — Therapy (Signed)
 " OUTPATIENT OCCUPATIONAL THERAPY ORTHO TREATMENT NOTE  Patient Name: Claire [name] MRN: [id number] DOB:Apr 05, 1970, 54 y.o., female Today's Date: 03/13/2024  PCP: Glenard Mire, MD REFERRING PROVIDER: Maree Hila, MD  END OF SESSION:  OT End of Session - 03/13/24 1039     Visit Number 6    Number of Visits 24    Date for Recertification  05/12/24    OT Start Time 0935    OT Stop Time 1017    OT Time Calculation (min) 42 min    Activity Tolerance Patient tolerated treatment well    Behavior During Therapy [name] for tasks assessed/performed          Past Medical History:  Diagnosis Date   Allergy    All on file   Anemia    Anxiety    Arthritis    hands, hips   Asthma    Bilateral leg cramps    Depression    Diabetes mellitus without complication (HCC)    history no longer a problem since weight loss surgery   DJD (degenerative joint disease)    Dyspnea on exertion    a. 07/2019 Echo: EF 60-65%, no rwma, nl RV fxn, triv MR.   GERD (gastroesophageal reflux disease)    Headache    Migraines   IBS (irritable bowel syndrome)    Kidney stone    Low serum vitamin B12    Lung nodules    Migraine    down to approx 4x/mo since starting meds   Muscle spasm    Nonobstructive CAD (coronary artery disease)    a. 06/2014 ETT: Ex time 8:59, max HR 155, 10.1 METS, no ST/T changes; b. 08/2019 Cor CTA: Ca2+ 175 (99th %'ile), LAD 35-49p, LCX 0-5m-->Med rx.   Oxygen deficiency [phone]   Due to COVID   Psoriasis    vaginal area   Seasonal allergies    Sleep apnea    history no longer a problem since weight loss surgery   Vasovagal syncope    [place], Dr. Ferdinand    Vitamin D  deficiency    Past Surgical History:  Procedure Laterality Date   APPENDECTOMY     BLADDER SURGERY  2010   BREAST BIOPSY Right 2014   stereotatic biopsy   CHOLECYSTECTOMY  2010   COLONOSCOPY  2014   Done at [facility]   COLONOSCOPY WITH PROPOFOL  N/A 01/28/2018   Procedure: COLONOSCOPY WITH PROPOFOL ;   Surgeon: Janalyn Keene NOVAK, MD;  Location: [facility];  Service: Endoscopy;  Laterality: N/A;   DILITATION & CURRETTAGE/HYSTROSCOPY WITH NOVASURE ABLATION N/A 06/16/2015   Procedure: DILATATION & CURETTAGE/HYSTEROSCOPY WITH NOVASURE ABLATION;  Surgeon: Charlie Flowers, MD;  Location: [facility];  Service: Gynecology;  Laterality: N/A;   ESOPHAGOGASTRODUODENOSCOPY N/A 10/24/2023   Procedure: EGD (ESOPHAGOGASTRODUODENOSCOPY);  Surgeon: Therisa Bi, MD;  Location: [facility] END[facility]COPY;  Service: Gastroenterology;  Laterality: N/A;   ESOPHAGOGASTRODUODENOSCOPY (EGD) WITH PROPOFOL  N/A 01/28/2018   Procedure: ESOPHAGOGASTRODUODENOSCOPY (EGD) WITH BIOPSIES;  Surgeon: Janalyn Keene NOVAK, MD;  Location: [facility];  Service: Endoscopy;  Laterality: N/A;   GASTRIC ROUX-EN-Y N/A 11/03/2019   Procedure: LAPAROSCOPIC 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 WITH UPPER ENDOSCOPY;  Surgeon: Gladis Cough, MD;  Location: [facility];  Service: General;  Laterality: N/A;   HERNIA REPAIR  2012   HIATAL HERNIA REPAIR N/A 11/03/2019   Procedure: HERNIA REPAIR HIATAL;  Surgeon: Gladis Cough, MD;  Location: [facility];  Service: General;  Laterality: N/A;   LAPAROSCOPIC GASTRIC SLEEVE RESECTION  2012   LAPAROSCOPY N/A 06/16/2015   Procedure: LAPAROSCOPY DIAGNOSTIC;  Surgeon: Charlie Flowers, MD;  Location: [facility];  Service: Gynecology;  Laterality: N/A;   LAPAROSCOPY N/A 04/01/2018   Procedure: LAPAROSCOPY DIAGNOSTIC ERAS PATHWAY ENTEROLYSIS, CECOPEXY;  Surgeon: Gladis Cough, MD;  Location: [facility];  Service: General;  Laterality: N/A;   LITHOTRIPSY  12/11/2017   laser lithotripsy   LYSIS OF ADHESION N/A 06/16/2015   Procedure: LYSIS OF ADHESION;  Surgeon: Charlie Flowers, MD;  Location: [facility];  Service: Gynecology;  Laterality: N/A;   PLANTAR FASCIA SURGERY Bilateral    ROBOTIC ASSISTED SALPINGO OOPHERECTOMY Right 06/16/2015   Procedure: ROBOTIC ASSISTED SALPINGO OOPHORECTOMY,  EXCISION OF RIGHT CUL DE SAC MASS;  Surgeon: Charlie Flowers, MD;  Location: [facility];  Service: Gynecology;  Laterality: Right;   TONSILLECTOMY     UPPER GI ENDOSCOPY     Patient Active Problem List   Diagnosis Date Noted   Primary osteoarthritis of right hip 12/10/2023   Chronic right hip pain 10/23/2023   Spondylosis of lumbar region without myelopathy or radiculopathy 09/23/2023   Myofascial pain syndrome 08/12/2023   Chronic pain syndrome 08/12/2023   Right shoulder pain 08/12/2023   Spondylolisthesis of lumbar region 01/25/2023   Morbid obesity with BMI of 40.0-44.9, adult (HCC) 07/10/2021   ADD (attention deficit disorder) 07/10/2021   Type 2 diabetes mellitus with obesity 07/10/2021   History of thyroiditis 07/10/2021   Fibromyalgia 07/10/2021   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/03/2019   B12 deficiency 04/10/2018   Columnar epithelial-lined lower esophagus    Postprocedural disorder of digestive system    Esophageal dysphagia    Persistent mood (affective) disorder, unspecified 01/15/2018   Insomnia related to another mental disorder 01/15/2018   OCD (obsessive compulsive disorder) 10/29/2017   GAD (generalized anxiety disorder) 10/29/2017   Nephrolithiasis 10/22/2017   Atherosclerosis of aorta 12/21/2016   Hiatal hernia 08/17/2016   Diverticulosis of colon 08/17/2016   Lumbosacral pain 01/07/2015   Intertrigo 11/05/2014   Perennial allergic rhinitis 09/03/2014   Lung nodules 09/03/2014   Vitamin D  deficiency 09/03/2014   History of kidney stones 09/03/2014   History of iron deficiency anemia 09/03/2014   Mild persistent asthma without complication 11/21/2012   GERD without esophagitis 11/21/2012   History of hyperlipidemia 11/21/2012   Migraine with aura and without status migrainosus 11/21/2012   History of sleep apnea 11/21/2012   IBS (irritable bowel  syndrome) 08/15/2012   ONSET DATE: 2020  REFERRING DIAG: Right Carpal Tunnel Syndrome  THERAPY DIAG:  Muscle weakness (generalized)  Other lack of coordination  Carpal tunnel syndrome on right  Rationale for Evaluation and Treatment: Rehabilitation  SUBJECTIVE:   SUBJECTIVE STATEMENT: Pt reports some minor improvement in pain from last session, without having taking her pain medications yet this morning. Pt accompanied by: self  PERTINENT HISTORY: Pt. Is 54 y.o. female who was referred for OT services for right Carpal Tunnel Syndrome. Pt. reports symptoms first started in 2020.  Pt. has had multiple steroid injections, however has recently been to receive additional injection due to elevation in A1C. Pt.'s symptoms have worsened over time, and are now affecting her ability to perform daily tasks without pain. Pt. Currently has a wrist support brace. 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  PRECAUTIONS: None  RED FLAGS: None   WEIGHT BEARING RESTRICTIONS: No  PAIN: 03/13/24: R hand pain 6/10 (without meds yet this morning) 03/10/24: R hand pain 7/10  03/04/24: Right hand pain 7-8/10 02/26/24: R hand 7/10 Pain  Are you having pain? 6/10 pain upon arrival, 4-5/10 pain following treatment  LIVING ENVIRONMENT: Lives with: lives with their family Lives in: House/apartment Stairs: 6 stairs, 1 and 1/2  levels  PLOF: Independent  PATIENT GOALS:  To decrease pain, and be able to use her right hand  OBJECTIVE:   Note: Objective measures were completed at Evaluation unless otherwise noted.  HAND DOMINANCE: Right  ADLs: MAM-20:  -Pt. Scored level 2 (very hard to do)-tie shoes, cut meat, wring towel, open medication bottle,  write 3-4 lines legibly, pick up a 1/2 full pitcher, turn a key. -Pt. Score level 3 (a little hard to do)-cut nails with a nail  clipper, button clothes, take things out of wallet, open a previously opened wide mouth jar, handle money/coins, dial telephone numbers, wash hands, use both hands to eat.  Pt. Is unable to crochet, grip phone, and hold pots and pans 2/2 pain.  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 [employer]  FUNCTIONAL OUTCOME MEASURES: MAM-20-58/80: see above  UPPER EXTREMITY ROM:     Active ROM Right Eval WFL Left Eval WFL  Shoulder flexion    Shoulder abduction    Shoulder adduction    Shoulder extension    Shoulder internal rotation    Shoulder external rotation    Elbow flexion    Elbow extension    Wrist flexion 56(64) 86  Wrist extension 54(72) 84  Wrist ulnar deviation 32 34  Wrist radial deviation 6 20  Wrist pronation 90 90  Wrist supination 70 90  (Blank rows = not tested)  Active ROM Right Eval WFL Left Eval WFL  Thumb MCP (0-60)    Thumb IP (0-80)    Thumb Radial abd/add (0-55)     Thumb Palmar abd/add (0-45)     Thumb Opposition to Small Finger     Index MCP (0-90)     Index PIP (0-100)     Index DIP (0-70)      Long MCP (0-90)      Long PIP (0-100)      Long DIP (0-70)      Ring MCP (0-90)      Ring PIP (0-100)      Ring DIP (0-70)      Little MCP (0-90)      Little PIP (0-100)      Little DIP (0-70)      (Blank rows = not tested)  UPPER EXTREMITY MMT:     MMT Right eval Left eval  Shoulder flexion [facility]  Shoulder abduction [facility]  Shoulder adduction    Shoulder extension    Shoulder internal rotation    Shoulder external rotation    Middle trapezius    Lower trapezius    Elbow flexion [facility] WFL  Elbow extension [facility] WFL  Wrist flexion 3-/5 WFL  Wrist extension 3-/5 WFL  Wrist ulnar deviation 4/5 WFL  Wrist radial deviation 3-/5 WFL  Wrist pronation    Wrist supination 3-/5 WFL  (Blank rows = not tested)  HAND FUNCTION: Grip strength: Right: 14 lbs; Left: 24 lbs, Lateral pinch: Right: 2 lbs, Left: 5 lbs, and 3 point  pinch: Right: 1 lbs, Left: 4 lbs  COORDINATION:   SENSATION:  Tingling in the left thumb,  2nd,and 3rd digits.  EDEMA: N/A  COGNITION: Overall cognitive status: Within functional limits for tasks assessed  TREATMENT DATE: 03/14/23 [place]:  X10 min for R hand pain management/muscle relaxation in prep for therapeutic exercises.  Manual Therapy: -STM to volar wrist and hand, using Stickon bar for deeper tissue massage, short strokes, specifically to thenar and hypothenar eminence, carpal tunnel/volar wrist, and long strokes up the flexors in the forearm, working to reduce fascial restrictions, increase tissue extensibility, and increase circulation. -Ktape application: Y strip applied for carpal tunnel decompression in the R hand.  Base of Y anchored at the medial carpal tunnel border on ulnar hand with 50% stretch to anchor opposite end of base of the Y to dorsal/radial hand; Y strips pulled in alternating directions around base of thumb, stretching from the medial border of the carpal tunnel on radial side.    Therapeutic Exercise: -Passive wrist flex/ext, RD/UD with slight joint distraction -Passive radial abduction at the thumb with manual stretch to reduce stiffness within thenar eminence. -Passive distal median nerve glide with 20 sec hold                                                                                                                            PATIENT EDUCATION: Education details: Benefits of Ktape for carpal tunnel decompression and indications for removing tape Person educated: Patient Education method: Explanation and Verbal cues Education comprehension: verbalized understanding  HOME EXERCISE PROGRAM:  Pink theraputty HEP was modified to:   -pink resistive foam block for gross grip, lateral, and 3pt. Pinch strengthening, progressing to yellow theraputty.  GOALS: Goals reviewed with patient? Yes  SHORT TERM GOALS: Target date: 03/31/2024    Pt. will  be independent with HEPs for the right hand  Baseline: No current HEP Goal status: INITIAL   LONG TERM GOALS: Target date: 05/12/2024  Pt. will decrease pain by 3 points on the pain scale Baseline: Eval: 6-7/10 right wrist Goal status: INITIAL  2.  Pt. Will increase right wrist ROM to be to improve engagement in ADL/IADL tasks. Baseline: Eval: Wrist flexion: R: 56(64), L: 86; Wrist extension: R: 54(72), L: 84 Goal status: INITIAL  3.  Pt. Will improve right grip strength by 5# to be able to more securely hold pots/pans Baseline: Eval: R: 14#, L: 24# Goal status: INITIAL  4.  Pt. Will improve right pinch strength b 2# to be able open bottles and jars Baseline: Eval: lateral pinch: R: 2#, L: 5#, 3pt. Pinch: R: 1#, L: 4# Goal status: INITIAL  5.  Pt. Will recall and implement 3 compensatory/joint protection/work simplification strategies for ADLs. Baseline: Eval: Education to be provided Goal status: INITIAL  ASSESSMENT:  CLINICAL IMPRESSION: Pt beginning with 6/10 pain in R hand at start of session without having taking pain meds yet this morning, which is an improvement from last session.  Less initial hypersensitivity with light touch to R carpal tunnel and thenar eminence at start of session,  with pt continuing to tolerate Stickon bar for deep tissue massage within palmar hand and volar wrist and forearm.  Pt receptive to trial of Ktape for carpal tunnel decompression, and verbalized understanding of potential benefits for pain management and increased circulation, as well as indications for removing tape.  Pt demonstrated good tolerance to tape after application, and pt will continue to monitor and remove as needed.  Pt. continues to benefit from skilled OT services to work on improving right hand functioning in order to improve, and maximize independence with ADLs, and IADL tasks.  PERFORMANCE DEFICITS: in functional skills including ADLs, IADLs, coordination, dexterity,  proprioception, ROM, strength, pain, Fine motor control, Gross motor control, and UE functional use, and psychosocial skills including coping strategies, environmental adaptation, interpersonal interactions, and routines and behaviors.   IMPAIRMENTS: are limiting patient from ADLs, IADLs, and leisure.   COMORBIDITIES: may have co-morbidities  that affects occupational performance. Patient will benefit from skilled OT to address above impairments and improve overall function.  MODIFICATION OR ASSISTANCE TO COMPLETE EVALUATION: Min-Moderate modification of tasks or assist with assess necessary to complete an evaluation.  OT OCCUPATIONAL PROFILE AND HISTORY: Detailed assessment: Review of records and additional review of physical, cognitive, psychosocial history related to current functional performance.  CLINICAL DECISION MAKING: Moderate - several treatment options, min-mod task modification necessary  REHAB POTENTIAL: Good  EVALUATION COMPLEXITY: Moderate      PLAN:  OT FREQUENCY: 1-2x/week  OT DURATION: 12 weeks  PLANNED INTERVENTIONS: 97168 OT Re-evaluation, 97535 self care/ADL training, 02889 therapeutic exercise, 97530 therapeutic activity, 97112 neuromuscular re-education, 97140 manual therapy, 97018 paraffin, 02989 moist heat, 97010 cryotherapy, 97034 contrast bath, 97033 iontophoresis, 97760 Orthotic Initial, 97763 Orthotic/Prosthetic subsequent, energy conservation, patient/family education, and DME and/or AE instructions  RECOMMENDED OTHER SERVICES: N/A  CONSULTED AND AGREED WITH PLAN OF CARE: Patient  PLAN FOR NEXT SESSION: Treatment  Inocente Blazing, MS, OTR/L   03/13/2024, 10:46 AM   "

## 2024-03-16 ENCOUNTER — Ambulatory Visit: Payer: PRIVATE HEALTH INSURANCE

## 2024-03-17 ENCOUNTER — Encounter: Payer: Self-pay | Admitting: Nurse Practitioner

## 2024-03-17 NOTE — Telephone Encounter (Signed)
 PA done

## 2024-03-18 ENCOUNTER — Ambulatory Visit: Payer: PRIVATE HEALTH INSURANCE

## 2024-03-19 ENCOUNTER — Ambulatory Visit: Payer: PRIVATE HEALTH INSURANCE

## 2024-03-19 ENCOUNTER — Encounter: Payer: Self-pay | Admitting: Student in an Organized Health Care Education/Training Program

## 2024-03-19 ENCOUNTER — Ambulatory Visit
Payer: PRIVATE HEALTH INSURANCE | Attending: Student in an Organized Health Care Education/Training Program | Admitting: Student in an Organized Health Care Education/Training Program

## 2024-03-19 DIAGNOSIS — R278 Other lack of coordination: Secondary | ICD-10-CM

## 2024-03-19 DIAGNOSIS — M47816 Spondylosis without myelopathy or radiculopathy, lumbar region: Secondary | ICD-10-CM

## 2024-03-19 DIAGNOSIS — M4316 Spondylolisthesis, lumbar region: Secondary | ICD-10-CM | POA: Diagnosis not present

## 2024-03-19 DIAGNOSIS — M545 Low back pain, unspecified: Secondary | ICD-10-CM

## 2024-03-19 DIAGNOSIS — M6281 Muscle weakness (generalized): Secondary | ICD-10-CM

## 2024-03-19 DIAGNOSIS — G5601 Carpal tunnel syndrome, right upper limb: Secondary | ICD-10-CM

## 2024-03-19 NOTE — Therapy (Signed)
 " OUTPATIENT OCCUPATIONAL THERAPY ORTHO TREATMENT NOTE  Patient Name: CASSANDRA HARBOLD MRN: [id number] DOB:07-11-1970, 54 y.o., female Today's Date: 03/19/2024  PCP: Glenard Mire, MD REFERRING PROVIDER: Maree Hila, MD  END OF SESSION:  OT End of Session - 03/19/24 1024     Visit Number 7    Number of Visits 24    Date for Recertification  05/12/24    OT Start Time 1017    OT Stop Time 1100    OT Time Calculation (min) 43 min    Activity Tolerance Patient tolerated treatment well    Behavior During Therapy [facility] for tasks assessed/performed          Past Medical History:  Diagnosis Date   Allergy    All on file   Anemia    Anxiety    Arthritis    hands, hips   Asthma    Bilateral leg cramps    Depression    Diabetes mellitus without complication (HCC)    history no longer a problem since weight loss surgery   DJD (degenerative joint disease)    Dyspnea on exertion    a. 07/2019 Echo: EF 60-65%, no rwma, nl RV fxn, triv MR.   GERD (gastroesophageal reflux disease)    Headache    Migraines   IBS (irritable bowel syndrome)    Kidney stone    Low serum vitamin B12    Lung nodules    Migraine    down to approx 4x/mo since starting meds   Muscle spasm    Nonobstructive CAD (coronary artery disease)    a. 06/2014 ETT: Ex time 8:59, max HR 155, 10.1 METS, no ST/T changes; b. 08/2019 Cor CTA: Ca2+ 175 (99th %'ile), LAD 35-49p, LCX 0-44m-->Med rx.   Oxygen deficiency [phone]   Due to COVID   Psoriasis    vaginal area   Seasonal allergies    Sleep apnea    history no longer a problem since weight loss surgery   Vasovagal syncope    [place], Dr. Ferdinand    Vitamin D  deficiency    Past Surgical History:  Procedure Laterality Date   APPENDECTOMY     BLADDER SURGERY  2010   BREAST BIOPSY Right 2014   stereotatic biopsy   CHOLECYSTECTOMY  2010   COLONOSCOPY  2014   Done at [facility]   COLONOSCOPY WITH PROPOFOL  N/A 01/28/2018   Procedure: COLONOSCOPY WITH PROPOFOL ;   Surgeon: Janalyn Keene NOVAK, MD;  Location: [facility];  Service: Endoscopy;  Laterality: N/A;   DILITATION & CURRETTAGE/HYSTROSCOPY WITH NOVASURE ABLATION N/A 06/16/2015   Procedure: DILATATION & CURETTAGE/HYSTEROSCOPY WITH NOVASURE ABLATION;  Surgeon: Charlie Flowers, MD;  Location: [facility];  Service: Gynecology;  Laterality: N/A;   ESOPHAGOGASTRODUODENOSCOPY N/A 10/24/2023   Procedure: EGD (ESOPHAGOGASTRODUODENOSCOPY);  Surgeon: Therisa Bi, MD;  Location: [facility];  Service: Gastroenterology;  Laterality: N/A;   ESOPHAGOGASTRODUODENOSCOPY (EGD) WITH PROPOFOL  N/A 01/28/2018   Procedure: ESOPHAGOGASTRODUODENOSCOPY (EGD) WITH BIOPSIES;  Surgeon: Janalyn Keene NOVAK, MD;  Location: [facility];  Service: Endoscopy;  Laterality: N/A;   GASTRIC ROUX-EN-Y N/A 11/03/2019   Procedure: LAPAROSCOPIC 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 WITH UPPER ENDOSCOPY;  Surgeon: Gladis Cough, MD;  Location: [facility];  Service: General;  Laterality: N/A;   HERNIA REPAIR  2012   HIATAL HERNIA REPAIR N/A 11/03/2019   Procedure: HERNIA REPAIR HIATAL;  Surgeon: Gladis Cough, MD;  Location: [facility];  Service: General;  Laterality: N/A;   LAPAROSCOPIC GASTRIC SLEEVE RESECTION  2012   LAPAROSCOPY N/A 06/16/2015   Procedure: LAPAROSCOPY DIAGNOSTIC;  Surgeon: Charlie Flowers, MD;  Location: [facility];  Service: Gynecology;  Laterality: N/A;   LAPAROSCOPY N/A 04/01/2018   Procedure: LAPAROSCOPY DIAGNOSTIC ERAS PATHWAY ENTEROLYSIS, CECOPEXY;  Surgeon: Gladis Cough, MD;  Location: [facility];  Service: General;  Laterality: N/A;   LITHOTRIPSY  12/11/2017   laser lithotripsy   LYSIS OF ADHESION N/A 06/16/2015   Procedure: LYSIS OF ADHESION;  Surgeon: Charlie Flowers, MD;  Location: [facility];  Service: Gynecology;  Laterality: N/A;   PLANTAR FASCIA SURGERY Bilateral    ROBOTIC ASSISTED SALPINGO OOPHERECTOMY Right 06/16/2015   Procedure: ROBOTIC ASSISTED SALPINGO OOPHORECTOMY,  EXCISION OF RIGHT CUL DE SAC MASS;  Surgeon: Charlie Flowers, MD;  Location: [facility];  Service: Gynecology;  Laterality: Right;   TONSILLECTOMY     UPPER GI ENDOSCOPY     Patient Active Problem List   Diagnosis Date Noted   Primary osteoarthritis of right hip 12/10/2023   Chronic right hip pain 10/23/2023   Spondylosis of lumbar region without myelopathy or radiculopathy 09/23/2023   Myofascial pain syndrome 08/12/2023   Chronic pain syndrome 08/12/2023   Right shoulder pain 08/12/2023   Spondylolisthesis of lumbar region 01/25/2023   Morbid obesity with BMI of 40.0-44.9, adult (HCC) 07/10/2021   ADD (attention deficit disorder) 07/10/2021   Type 2 diabetes mellitus with obesity 07/10/2021   History of thyroiditis 07/10/2021   Fibromyalgia 07/10/2021   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/03/2019   B12 deficiency 04/10/2018   Columnar epithelial-lined lower esophagus    Postprocedural disorder of digestive system    Esophageal dysphagia    Persistent mood (affective) disorder, unspecified 01/15/2018   Insomnia related to another mental disorder 01/15/2018   OCD (obsessive compulsive disorder) 10/29/2017   GAD (generalized anxiety disorder) 10/29/2017   Nephrolithiasis 10/22/2017   Atherosclerosis of aorta 12/21/2016   Hiatal hernia 08/17/2016   Diverticulosis of colon 08/17/2016   Lumbosacral pain 01/07/2015   Intertrigo 11/05/2014   Perennial allergic rhinitis 09/03/2014   Lung nodules 09/03/2014   Vitamin D  deficiency 09/03/2014   History of kidney stones 09/03/2014   History of iron deficiency anemia 09/03/2014   Mild persistent asthma without complication 11/21/2012   GERD without esophagitis 11/21/2012   History of hyperlipidemia 11/21/2012   Migraine with aura and without status migrainosus 11/21/2012   History of sleep apnea 11/21/2012   IBS (irritable bowel  syndrome) 08/15/2012   ONSET DATE: 2020  REFERRING DIAG: Right Carpal Tunnel Syndrome  THERAPY DIAG:  Muscle weakness (generalized)  Carpal tunnel syndrome on right  Other lack of coordination  Rationale for Evaluation and Treatment: Rehabilitation  SUBJECTIVE:   SUBJECTIVE STATEMENT: Pt reports Ktape application to R hand lasted only about 4 hours after last session d/t tape peeling.  (See below for Ktape application modification) Pt accompanied by: self  PERTINENT HISTORY: Pt. Is 54 y.o. female who was referred for OT services for right Carpal Tunnel Syndrome. Pt. reports symptoms first started in 2020.  Pt. has had multiple steroid injections, however has recently been to receive additional injection due to elevation in A1C. Pt.'s symptoms have worsened over time, and are now affecting her ability to perform daily tasks without pain. Pt. Currently has a wrist support brace. 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  PRECAUTIONS: None  RED FLAGS: None   WEIGHT BEARING RESTRICTIONS: No  PAIN: 03/19/24: R hand pain 7/10, decreasing to 5/10 by end of session 03/10/24: R hand pain 7/10  03/04/24: Right hand pain 7-8/10 02/26/24: R hand 7/10 Pain  Are you having pain? 6/10 pain upon arrival, 4-5/10 pain following treatment  LIVING ENVIRONMENT: Lives with: lives with their family Lives in: House/apartment Stairs: 6 stairs, 1 and 1/2  levels  PLOF: Independent  PATIENT GOALS:  To decrease pain, and be able to use her right hand  OBJECTIVE:   Note: Objective measures were completed at Evaluation unless otherwise noted.  HAND DOMINANCE: Right  ADLs: MAM-20:  -Pt. Scored level 2 (very hard to do)-tie shoes, cut meat, wring towel, open medication bottle,  write 3-4 lines legibly, pick up a 1/2 full pitcher, turn a key. -Pt. Score level 3 (a little hard  to do)-cut nails with a nail clipper, button clothes, take things out of wallet, open a previously opened wide mouth jar, handle money/coins, dial telephone numbers, wash hands, use both hands to eat.  Pt. Is unable to crochet, grip phone, and hold pots and pans 2/2 pain.  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 [employer]  FUNCTIONAL OUTCOME MEASURES: MAM-20-58/80: see above  UPPER EXTREMITY ROM:     Active ROM Right Eval WFL Left Eval WFL  Shoulder flexion    Shoulder abduction    Shoulder adduction    Shoulder extension    Shoulder internal rotation    Shoulder external rotation    Elbow flexion    Elbow extension    Wrist flexion 56(64) 86  Wrist extension 54(72) 84  Wrist ulnar deviation 32 34  Wrist radial deviation 6 20  Wrist pronation 90 90  Wrist supination 70 90  (Blank rows = not tested)  Active ROM Right Eval WFL Left Eval WFL  Thumb MCP (0-60)    Thumb IP (0-80)    Thumb Radial abd/add (0-55)     Thumb Palmar abd/add (0-45)     Thumb Opposition to Small Finger     Index MCP (0-90)     Index PIP (0-100)     Index DIP (0-70)      Long MCP (0-90)      Long PIP (0-100)      Long DIP (0-70)      Ring MCP (0-90)      Ring PIP (0-100)      Ring DIP (0-70)      Little MCP (0-90)      Little PIP (0-100)      Little DIP (0-70)      (Blank rows = not tested)  UPPER EXTREMITY MMT:     MMT Right eval Left eval  Shoulder flexion [facility]  Shoulder abduction [facility]  Shoulder adduction    Shoulder extension    Shoulder internal rotation    Shoulder external rotation    Middle trapezius    Lower trapezius    Elbow flexion [facility] WFL  Elbow extension [facility] WFL  Wrist flexion 3-/5 WFL  Wrist extension 3-/5 WFL  Wrist ulnar deviation 4/5 WFL  Wrist radial deviation 3-/5 WFL  Wrist pronation    Wrist supination 3-/5 WFL  (Blank rows = not tested)  HAND FUNCTION: Grip strength: Right: 14 lbs; Left: 24 lbs, Lateral pinch: Right: 2 lbs,  Left: 5 lbs, and 3 point pinch: Right: 1 lbs, Left: 4 lbs  COORDINATION:  SENSATION:  Tingling in the left thumb, 2nd,and 3rd digits.  EDEMA: N/A  COGNITION: Overall cognitive status: Within functional limits for tasks assessed  TREATMENT DATE: 03/20/23 [place]affin:  X10 min for R hand pain management/muscle relaxation in prep for therapeutic exercises.  Manual Therapy: -STM to volar wrist and hand, using Stickon bar for deeper tissue massage, short strokes, specifically to thenar and hypothenar eminence, carpal tunnel/volar wrist, and long strokes up the flexors in the forearm, working to reduce fascial restrictions, increase tissue extensibility, and increase circulation. -Ktape application (alternate technique from last session) for R carpal tunnel decompression:   -1 long I strip with 2 diamond cut outs for 3rd and 4th digits of R hand, anchoring just proximal to dorsal MCPs of these digits, pulled overtop to extend along volar palm and anchoring at mid volar forearm; applied with R wrist positioned in ~45-50* ext with ~75% stretch -1 short I strip anchored at the radial volar wrist, wrapped around to anchor again at the ulnar wrist, with gap at the carpal tunnel  Therapeutic Exercise: -Passive wrist flex/ext, RD/UD with slight joint distraction -Passive radial abduction at the thumb with manual stretch to reduce stiffness within thenar eminence. -HEP review                                                                                                                            PATIENT EDUCATION: Education details: Conservative pain management: OTC NSAIDs, ice cube massage to palm/carpal tunnel, HEP within limits of pain Person educated: Patient Education method: Explanation and Verbal cues Education comprehension: verbalized understanding  HOME EXERCISE PROGRAM:  Pink theraputty HEP was modified to:   -pink resistive foam block for gross grip, lateral, and 3pt. Pinch  strengthening, progressing to yellow theraputty.  GOALS: Goals reviewed with patient? Yes  SHORT TERM GOALS: Target date: 03/31/2024    Pt. will be independent with HEPs for the right hand  Baseline: No current HEP Goal status: INITIAL   LONG TERM GOALS: Target date: 05/12/2024  Pt. will decrease pain by 3 points on the pain scale Baseline: Eval: 6-7/10 right wrist Goal status: INITIAL  2.  Pt. Will increase right wrist ROM to be to improve engagement in ADL/IADL tasks. Baseline: Eval: Wrist flexion: R: 56(64), L: 86; Wrist extension: R: 54(72), L: 84 Goal status: INITIAL  3.  Pt. Will improve right grip strength by 5# to be able to more securely hold pots/pans Baseline: Eval: R: 14#, L: 24# Goal status: INITIAL  4.  Pt. Will improve right pinch strength b 2# to be able open bottles and jars Baseline: Eval: lateral pinch: R: 2#, L: 5#, 3pt. Pinch: R: 1#, L: 4# Goal status: INITIAL  5.  Pt. Will recall and implement 3 compensatory/joint protection/work simplification strategies for ADLs. Baseline: Eval: Education to be provided Goal status: INITIAL  ASSESSMENT:  CLINICAL IMPRESSION: Pt beginning with 7/10 pain in R hand at start of session, reducing to 5/10 pain end of  session.  Changed application technique for Ktape d/t repeated peeling at the palm/wrist with technique used last session.  Pt will monitor effectiveness of technique used today for pain management and longer wearing time.  Pt reports that she has not been taking an OTC anti-inflammatory, and was advised this may be beneficial to relieve carpal tunnel symptoms in combination with therapy, positioning, ice massage, contrast bath, and other strategies used in previous sessions.  OT advised pt consult MD if she has any questions or concerns with taking an OTC NSAID.  Pt. continues to benefit from skilled OT services to work on improving right hand functioning in order to improve, and maximize independence with ADLs, and  IADL tasks.  PERFORMANCE DEFICITS: in functional skills including ADLs, IADLs, coordination, dexterity, proprioception, ROM, strength, pain, Fine motor control, Gross motor control, and UE functional use, and psychosocial skills including coping strategies, environmental adaptation, interpersonal interactions, and routines and behaviors.   IMPAIRMENTS: are limiting patient from ADLs, IADLs, and leisure.   COMORBIDITIES: may have co-morbidities  that affects occupational performance. Patient will benefit from skilled OT to address above impairments and improve overall function.  MODIFICATION OR ASSISTANCE TO COMPLETE EVALUATION: Min-Moderate modification of tasks or assist with assess necessary to complete an evaluation.  OT OCCUPATIONAL PROFILE AND HISTORY: Detailed assessment: Review of records and additional review of physical, cognitive, psychosocial history related to current functional performance.  CLINICAL DECISION MAKING: Moderate - several treatment options, min-mod task modification necessary  REHAB POTENTIAL: Good  EVALUATION COMPLEXITY: Moderate      PLAN:  OT FREQUENCY: 1-2x/week  OT DURATION: 12 weeks  PLANNED INTERVENTIONS: 97168 OT Re-evaluation, 97535 self care/ADL training, 02889 therapeutic exercise, 97530 therapeutic activity, 97112 neuromuscular re-education, 97140 manual therapy, 97018 paraffin, 02989 moist heat, 97010 cryotherapy, 97034 contrast bath, 97033 iontophoresis, 97760 Orthotic Initial, 97763 Orthotic/Prosthetic subsequent, energy conservation, patient/family education, and DME and/or AE instructions  RECOMMENDED OTHER SERVICES: N/A  CONSULTED AND AGREED WITH PLAN OF CARE: Patient  PLAN FOR NEXT SESSION: Treatment  Inocente Blazing, MS, OTR/L   03/19/2024, 10:26 AM   "

## 2024-03-19 NOTE — Progress Notes (Signed)
 PROVIDER NOTE: Interpretation of information contained herein should be left to medically-trained personnel. Specific patient instructions are provided elsewhere under Patient Instructions section of medical record. This document was created in part using AI and STT-dictation technology, any transcriptional errors that may result from this process are unintentional.  Patient: Claire [name]  Service: E/M   PCP: [name], Krichna, MD  DOB: 1970-12-05  DOS: 03/19/2024  Provider: Wallie Sherry, MD  MRN: [id number]  Delivery: Virtual Visit  Specialty: Interventional Pain Management  Type: Established Patient  Setting: Ambulatory outpatient facility  Specialty designation: 09  Referring Prov.: [name], Krichna, MD  Location: Remote location       Virtual Encounter - Pain Management PROVIDER NOTE: Information contained herein reflects review and annotations entered in association with encounter. Interpretation of such information and data should be left to medically-trained personnel. Information provided to patient can be located elsewhere in the medical record under Patient Instructions. Document created using STT-dictation technology, any transcriptional errors that may result from process are unintentional.    Contact & Pharmacy Preferred: [phone] Home: [phone] (home) Mobile: [phone] (mobile) E-mail: [email or web address]  [facility] [phone], [place] - [address] [place] [address] Phone: [phone] Fax: [phone]  [facility] [address] [place] [address] Phone: [phone] Fax: [phone]   Pre-screening  Claire [name] offered in-person vs virtual encounter. She indicated preferring virtual for this encounter.   Reason COVID-19*  Social distancing based on CDC and AMA recommendations.   I contacted Claire [name] on 03/19/2024 via telephone.      I clearly  identified myself as Claire Sherry, MD. I verified that I was speaking with the correct person using two identifiers (Name: Claire [name], and date of birth: 11-08-1970).  Consent I sought verbal advanced consent from Claire [name] for virtual visit interactions. I informed Claire [name] of possible security and privacy concerns, risks, and limitations associated with providing not-in-person medical evaluation and management services. I also informed Claire [name] of the availability of in-person appointments. Finally, I informed her that there would be a charge for the virtual visit and that she could be  personally, fully or partially, financially responsible for it. Claire [name] expressed understanding and agreed to proceed.   Historic Elements   Claire [name] is a 54 y.o. year old, female patient evaluated today after our last contact on 12/10/2023. Claire [name]  has a past medical history of Allergy, Anemia, Anxiety, Arthritis, Asthma, Bilateral leg cramps, Depression, Diabetes mellitus without complication (HCC), DJD (degenerative joint disease), Dyspnea on exertion, GERD (gastroesophageal reflux disease), Headache, IBS (irritable bowel syndrome), Kidney stone, Low serum vitamin B12, Lung nodules, Migraine, Muscle spasm, Nonobstructive CAD (coronary artery disease), Oxygen deficiency ([id number]), Psoriasis, Seasonal allergies, Sleep apnea, Vasovagal syncope, and Vitamin D  deficiency. She also  has a past surgical history that includes Laparoscopic gastric sleeve resection (2012); Cholecystectomy (2010); Bladder surgery (2010); Plantar fascia surgery (Bilateral); Breast biopsy (Right, 2014); Colonoscopy (2014); Upper gi endoscopy; Tonsillectomy; laparoscopy (N/A, 06/16/2015); Robotic assisted salpingo oophorectomy (Right, 06/16/2015); Dilatation & currettage/hysteroscopy with novasure ablation (N/A, 06/16/2015); Lysis of adhesion (N/A, 06/16/2015); Colonoscopy with propofol  (N/A,  01/28/2018); Esophagogastroduodenoscopy (egd) with propofol  (N/A, 01/28/2018); Lithotripsy (12/11/2017); laparoscopy (N/A, 04/01/2018); Gastric Roux-En-Y (N/A, 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 Claire [name] has a current medication list which includes the following prescription(s): aspirin  ec, atorvastatin , budesonide -formoterol , calcium  citrate chewy bite, clobetasol cream, cyanocobalamin ,  cyclobenzaprine , diazepam , glimepiride, iron-vitamin c, ketoconazole , ketoconazole , lactobacillus, metformin, mometasone , mometasone , montelukast , multivitamin adult, nystatin , ondansetron , pantoprazole , pregabalin , ozempic  (2 mg/dose), tramadol , trazodone , triamcinolone  cream, trintellix , vitamin d  (ergocalciferol ), and zonisamide. She  reports that she quit smoking about 31 years ago. Her smoking use included cigarettes. She started smoking about 41 years ago. She has a 10 pack-year smoking history. She has never used smokeless tobacco. She reports current alcohol use of about 2.0 standard drinks of alcohol per week. She reports that she does not use drugs. Claire [name] is allergic to aspartame and phenylalanine, gabapentin, glucose, linzess  [linaclotide ], other, triamcinolone , cymbalta  [duloxetine  hcl], duloxetine , tape, and tapentadol.  BMI: Estimated body mass index is 39.11 kg/m as calculated from the following:   Height as of 02/17/24: 5' 1 (1.549 m).   Weight as of 02/17/24: 207 lb (93.9 kg). Last encounter: 12/10/2023. Last procedure: 11/11/2023.  HPI  Today, she is being contacted for   - having inceased low back and radiating right hip pain due to lumbar facet arthropathy and hip OA - has been refractory to B/L L,3,4,5 RFA and right hip IA steroid injection - patient has done PT with limited response - is on appropriate multimodal analgesics - discussed neuromodulation and peripheral nerve  stimulation which patient is interested in   Laboratory Chemistry Profile   Renal Lab Results  Component Value Date   BUN 8 12/19/2023   CREATININE 0.65 12/19/2023   LABCREA 126 12/19/2023   BCR SEE NOTE: 12/19/2023   GFRAA >60 11/04/2019   GFRNONAA >60 11/04/2019    Hepatic Lab Results  Component Value Date   AST 52 (H) 12/19/2023   ALT 60 (H) 12/19/2023   ALBUMIN 4.1 11/03/2019   ALKPHOS 67 11/03/2019   LIPASE 30 04/02/2019    Electrolytes Lab Results  Component Value Date   NA 134 (L) 12/19/2023   K 4.2 12/19/2023   CL 99 12/19/2023   CALCIUM  9.1 12/19/2023   MG 1.8 11/04/2019    Bone Lab Results  Component Value Date   VD25OH 28 (L) 12/19/2023    Inflammation (CRP: Acute Phase) (ESR: Chronic Phase) No results found for: CRP, ESRSEDRATE, LATICACIDVEN       Note: Above Lab results reviewed.  Imaging  MM 3D SCREENING MAMMOGRAM BILATERAL BREAST CLINICAL DATA:  Screening.  EXAM: DIGITAL SCREENING BILATERAL MAMMOGRAM WITH TOMOSYNTHESIS AND CAD  TECHNIQUE: Bilateral screening digital craniocaudal and mediolateral oblique mammograms were obtained. Bilateral screening digital breast tomosynthesis was performed. The images were evaluated with computer-aided detection.  COMPARISON:  Previous exam(s).  ACR Breast Density Category b: There are scattered areas of fibroglandular density.  FINDINGS: There are no findings suspicious for malignancy.  IMPRESSION: No mammographic evidence of malignancy. A result letter of this screening mammogram will be mailed directly to the patient.  RECOMMENDATION: Screening mammogram in one year. (Code:SM-B-01Y)  BI-RADS CATEGORY  1: Negative.  Electronically Signed   By: Curtistine Noble   On: 01/20/2024 10:49 CLINICAL DATA:  Lower back pain with symptoms persisting over 6 weeks of treatment   EXAM: MRI LUMBAR SPINE WITHOUT CONTRAST   TECHNIQUE: Multiplanar, multisequence MR imaging of the lumbar spine  was performed. No intravenous contrast was administered.   COMPARISON:  12/28/2015   FINDINGS: Segmentation:  Standard.   Alignment:  Mild degenerative anterolisthesis at L4-5 since prior.   Vertebrae: No fracture, evidence of discitis, or bone lesion. Low-grade edematous signal at L4-5 facets bilaterally.   Conus medullaris and cauda equina: Conus extends to the T12-L1  level. Conus and cauda equina appear normal.   Paraspinal and other soft tissues: No perispinal mass or inflammation.   Disc levels:   T12- L1: Unremarkable.   L1-L2: Mild facet spurring.  No neural impingement   L2-L3: Mild facet spurring.  No neural impingement   L3-L4: Moderate degenerative facet spurring asymmetric to the right. Negative disc. No neural impingement   L4-L5: Advanced facet degeneration with spurring and joint distortion. Mild anterolisthesis since prior. Minor disc bulging especially at the foramina but patent canal and foramen bilaterally.   L5-S1:Degenerative facet spurring asymmetric to the left. No herniation or impingement   IMPRESSION: 1. Lumbar spine degeneration especially affecting the facets, greatest at L4-5 where there is anterolisthesis and low-grade marrow edema bilaterally. 2. No neural compression.     Electronically Signed   By: Dorn Roulette M.D.   On: 01/23/2023 08:09    Assessment  The primary encounter diagnosis was Lumbosacral pain. Diagnoses of Spondylolisthesis of lumbar region and Spondylosis of lumbar region without myelopathy or radiculopathy were also pertinent to this visit.  Plan of Care  I explained to pt how Sprint peripheral nerve stimulation (PNS)  is typically considered for patients with chronic, localized pain that is not responding to conservative treatments such as medications, physical therapy, or injections.  The SPRINT peripheral nerve stimulator is designed for short-term, percutaneous use (approximately 60 days) to modulate pain  through targeted nerve stimulation. Unlike traditional permanent implants, SPRINT is temporary but can lead to long-lasting pain relief by altering pain signals.  We discussed the risks and benefits of peripheral nerve stimulation. Benefits: minimally invasive, does not require permanent implantation, can offer significant pain relief, improving function and quality of life, may reduce the need for long-term opioid use. The risks/challenges include (but not limited to):  infection or irritation at the stimulation site, discomfort from electrode placement, risk of incomplete pain relief or temporary relief post-removal, limited to short-term therapy, which may be a disadvantage in chronic, refractory cases   Orders:  Orders Placed This Encounter  Procedures   Implantable Peripheral Nerve Stimulator    Standing Status:   Future    Expiration Date:   06/17/2024    Scheduling Instructions:     Procedure: Temporary implantable peripheral nerve stimulator     Side:  L4 medial branch     Nerve site: Right followed by left     Sedation: IV Versed      Timeframe: ASAA    Location of Procedure:   [facility]   Follow-up plan:   Return in about 18 days (around 04/06/2024) for Right PNS , in clinic IV Versed  .      TPI- cervical, thoracic 08/19/23; bilateral L3, L4, L5 RFA 10/09/2023. Right IA hip steroid injection 11/11/23    Recent Visits Date Type Provider Dept  02/17/24 Office Visit Patel, Seema K, NP [facility]  01/20/24 Office Visit Patel, Seema K, NP [facility]  Showing recent visits within past 90 days and meeting all other requirements Today's Visits Date Type Provider Dept  03/19/24 Office Visit Marcelino Nurse, MD [facility]  Showing today's visits and meeting all other requirements Future Appointments Date Type Provider Dept  05/11/24 Appointment Patel, Seema K, NP [facility]  Showing future appointments within next 90 days and  meeting all other requirements  I discussed the assessment and treatment plan with the patient. The patient was provided an opportunity to ask questions and all were answered. The  patient agreed with the plan and demonstrated an understanding of the instructions.  Patient advised to call back or seek an in-person evaluation if the symptoms or condition worsens.  Duration of encounter: .  Note by: Claire Sherry, MD Date: 03/19/2024; Time: 3:51 PM

## 2024-03-25 ENCOUNTER — Ambulatory Visit: Payer: PRIVATE HEALTH INSURANCE

## 2024-03-25 DIAGNOSIS — R278 Other lack of coordination: Secondary | ICD-10-CM

## 2024-03-25 DIAGNOSIS — M6281 Muscle weakness (generalized): Secondary | ICD-10-CM

## 2024-03-25 DIAGNOSIS — G5601 Carpal tunnel syndrome, right upper limb: Secondary | ICD-10-CM

## 2024-03-25 NOTE — Therapy (Unsigned)
 " OUTPATIENT OCCUPATIONAL THERAPY ORTHO TREATMENT NOTE  Patient Name: Claire [name] MRN: [id number] DOB:01-Mar-1970, 54 y.o., female Today's Date: 03/25/2024  PCP: Glenard Mire, MD REFERRING PROVIDER: Maree Hila, MD  END OF SESSION:  OT End of Session - 03/25/24 1154     Visit Number 8    Number of Visits 24    Date for Recertification  05/12/24    OT Start Time 1148    OT Stop Time 1230    OT Time Calculation (min) 42 min    Activity Tolerance Patient tolerated treatment well    Behavior During Therapy [facility] for tasks assessed/performed         Past Medical History:  Diagnosis Date   Allergy    All on file   Anemia    Anxiety    Arthritis    hands, hips   Asthma    Bilateral leg cramps    Depression    Diabetes mellitus without complication (HCC)    history no longer a problem since weight loss surgery   DJD (degenerative joint disease)    Dyspnea on exertion    a. 07/2019 Echo: EF 60-65%, no rwma, nl RV fxn, triv MR.   GERD (gastroesophageal reflux disease)    Headache    Migraines   IBS (irritable bowel syndrome)    Kidney stone    Low serum vitamin B12    Lung nodules    Migraine    down to approx 4x/mo since starting meds   Muscle spasm    Nonobstructive CAD (coronary artery disease)    a. 06/2014 ETT: Ex time 8:59, max HR 155, 10.1 METS, no ST/T changes; b. 08/2019 Cor CTA: Ca2+ 175 (99th %'ile), LAD 35-49p, LCX 0-49m-->Med rx.   Oxygen deficiency [phone]   Due to COVID   Psoriasis    vaginal area   Seasonal allergies    Sleep apnea    history no longer a problem since weight loss surgery   Vasovagal syncope    [place], Dr. Ferdinand    Vitamin D  deficiency    Past Surgical History:  Procedure Laterality Date   APPENDECTOMY     BLADDER SURGERY  2010   BREAST BIOPSY Right 2014   stereotatic biopsy   CHOLECYSTECTOMY  2010   COLONOSCOPY  2014   [facility]   COLONOSCOPY WITH PROPOFOL  N/A 01/28/2018   Procedure: COLONOSCOPY WITH PROPOFOL ;   Surgeon: Janalyn Keene NOVAK, MD;  Location: [facility];  Service: Endoscopy;  Laterality: N/A;   DILITATION & CURRETTAGE/HYSTROSCOPY WITH NOVASURE ABLATION N/A 06/16/2015   Procedure: DILATATION & CURETTAGE/HYSTEROSCOPY WITH NOVASURE ABLATION;  Surgeon: Charlie Flowers, MD;  Location: [facility];  Service: Gynecology;  Laterality: N/A;   ESOPHAGOGASTRODUODENOSCOPY N/A 10/24/2023   Procedure: EGD (ESOPHAGOGASTRODUODENOSCOPY);  Surgeon: Therisa Bi, MD;  Location: [facility];  Service: Gastroenterology;  Laterality: N/A;   ESOPHAGOGASTRODUODENOSCOPY (EGD) WITH PROPOFOL  N/A 01/28/2018   Procedure: ESOPHAGOGASTRODUODENOSCOPY (EGD) WITH BIOPSIES;  Surgeon: Janalyn Keene NOVAK, MD;  Location: [facility];  Service: Endoscopy;  Laterality: N/A;   GASTRIC ROUX-EN-Y N/A 11/03/2019   Procedure: LAPAROSCOPIC 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 WITH UPPER ENDOSCOPY;  Surgeon: Gladis Cough, MD;  Location: [facility];  Service: General;  Laterality: N/A;   HERNIA REPAIR  2012   HIATAL HERNIA REPAIR N/A 11/03/2019   Procedure: HERNIA REPAIR HIATAL;  Surgeon: Gladis Cough, MD;  Location: [facility];  Service: General;  Laterality: N/A;   LAPAROSCOPIC GASTRIC SLEEVE RESECTION  2012   LAPAROSCOPY N/A 06/16/2015   Procedure: LAPAROSCOPY DIAGNOSTIC;  Surgeon: Charlie Flowers, MD;  Location: [facility];  Service: Gynecology;  Laterality: N/A;   LAPAROSCOPY N/A 04/01/2018   Procedure: LAPAROSCOPY DIAGNOSTIC ERAS PATHWAY ENTEROLYSIS, CECOPEXY;  Surgeon: Gladis Cough, MD;  Location: [facility];  Service: General;  Laterality: N/A;   LITHOTRIPSY  12/11/2017   laser lithotripsy   LYSIS OF ADHESION N/A 06/16/2015   Procedure: LYSIS OF ADHESION;  Surgeon: Charlie Flowers, MD;  Location: [facility];  Service: Gynecology;  Laterality: N/A;   PLANTAR FASCIA SURGERY Bilateral    ROBOTIC ASSISTED SALPINGO OOPHERECTOMY Right 06/16/2015   Procedure: ROBOTIC ASSISTED SALPINGO OOPHORECTOMY,  EXCISION OF RIGHT CUL DE SAC MASS;  Surgeon: Charlie Flowers, MD;  Location: [facility];  Service: Gynecology;  Laterality: Right;   TONSILLECTOMY     UPPER GI ENDOSCOPY     Patient Active Problem List   Diagnosis Date Noted   Primary osteoarthritis of right hip 12/10/2023   Chronic right hip pain 10/23/2023   Spondylosis of lumbar region without myelopathy or radiculopathy 09/23/2023   Myofascial pain syndrome 08/12/2023   Chronic pain syndrome 08/12/2023   Right shoulder pain 08/12/2023   Spondylolisthesis of lumbar region 01/25/2023   Morbid obesity with BMI of 40.0-44.9, adult (HCC) 07/10/2021   ADD (attention deficit disorder) 07/10/2021   Type 2 diabetes mellitus with obesity 07/10/2021   History of thyroiditis 07/10/2021   Fibromyalgia 07/10/2021   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/03/2019   B12 deficiency 04/10/2018   Columnar epithelial-lined lower esophagus    Postprocedural disorder of digestive system    Esophageal dysphagia    Persistent mood (affective) disorder, unspecified 01/15/2018   Insomnia related to another mental disorder 01/15/2018   OCD (obsessive compulsive disorder) 10/29/2017   GAD (generalized anxiety disorder) 10/29/2017   Nephrolithiasis 10/22/2017   Atherosclerosis of aorta 12/21/2016   Hiatal hernia 08/17/2016   Diverticulosis of colon 08/17/2016   Lumbosacral pain 01/07/2015   Intertrigo 11/05/2014   Perennial allergic rhinitis 09/03/2014   Lung nodules 09/03/2014   Vitamin D  deficiency 09/03/2014   History of kidney stones 09/03/2014   History of iron deficiency anemia 09/03/2014   Mild persistent asthma without complication 11/21/2012   GERD without esophagitis 11/21/2012   History of hyperlipidemia 11/21/2012   Migraine with aura and without status migrainosus 11/21/2012   History of sleep apnea 11/21/2012   IBS (irritable bowel  syndrome) 08/15/2012   ONSET DATE: 2020  REFERRING DIAG: Right Carpal Tunnel Syndrome  THERAPY DIAG:  Muscle weakness (generalized)  Other lack of coordination  Carpal tunnel syndrome on right  Rationale for Evaluation and Treatment: Rehabilitation  SUBJECTIVE:   SUBJECTIVE STATEMENT: Pt reports consistent icing and stretching for pain management.  Pt accompanied by: self  PERTINENT HISTORY: Pt. Is 54 y.o. female who was referred for OT services for right Carpal Tunnel Syndrome. Pt. reports symptoms first started in 2020.  Pt. has had multiple steroid injections, however has recently been to receive additional injection due to elevation in A1C. Pt.'s symptoms have worsened over time, and are now affecting her ability to perform daily tasks without pain. Pt. Currently has a wrist support brace. 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  PRECAUTIONS: None  RED FLAGS: None   WEIGHT BEARING RESTRICTIONS:  No  PAIN:  03/25/24: R hand pain 6/10 pain, decreasing to 5/10 by end of session 03/19/24: R hand pain 7/10, decreasing to 5/10 by end of session 03/10/24: R hand pain 7/10  03/04/24: Right hand pain 7-8/10 02/26/24: R hand 7/10 Pain  Are you having pain? 6/10 pain upon arrival, 4-5/10 pain following treatment  LIVING ENVIRONMENT: Lives with: lives with their family Lives in: House/apartment Stairs: 6 stairs, 1 and 1/2  levels  PLOF: Independent  PATIENT GOALS:  To decrease pain, and be able to use her right hand  OBJECTIVE:   Note: Objective measures were completed at Evaluation unless otherwise noted.  HAND DOMINANCE: Right  ADLs: MAM-20:  -Pt. Scored level 2 (very hard to do)-tie shoes, cut meat, wring towel, open medication bottle,  write 3-4 lines legibly, pick up a 1/2 full pitcher, turn a key. -Pt. Score level 3 (a little hard to do)-cut  nails with a nail clipper, button clothes, take things out of wallet, open a previously opened wide mouth jar, handle money/coins, dial telephone numbers, wash hands, use both hands to eat.  Pt. Is unable to crochet, grip phone, and hold pots and pans 2/2 pain.  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 [employer]  FUNCTIONAL OUTCOME MEASURES: MAM-20-58/80: see above  UPPER EXTREMITY ROM:     Active ROM Right Eval WFL Left Eval WFL  Shoulder flexion    Shoulder abduction    Shoulder adduction    Shoulder extension    Shoulder internal rotation    Shoulder external rotation    Elbow flexion    Elbow extension    Wrist flexion 56(64) 86  Wrist extension 54(72) 84  Wrist ulnar deviation 32 34  Wrist radial deviation 6 20  Wrist pronation 90 90  Wrist supination 70 90  (Blank rows = not tested)  Active ROM Right Eval WFL Left Eval WFL  Thumb MCP (0-60)    Thumb IP (0-80)    Thumb Radial abd/add (0-55)     Thumb Palmar abd/add (0-45)     Thumb Opposition to Small Finger     Index MCP (0-90)     Index PIP (0-100)     Index DIP (0-70)      Long MCP (0-90)      Long PIP (0-100)      Long DIP (0-70)      Ring MCP (0-90)      Ring PIP (0-100)      Ring DIP (0-70)      Little MCP (0-90)      Little PIP (0-100)      Little DIP (0-70)      (Blank rows = not tested)  UPPER EXTREMITY MMT:     MMT Right eval Left eval  Shoulder flexion [facility]  Shoulder abduction [facility]  Shoulder adduction    Shoulder extension    Shoulder internal rotation    Shoulder external rotation    Middle trapezius    Lower trapezius    Elbow flexion [facility] WFL  Elbow extension [facility] WFL  Wrist flexion 3-/5 WFL  Wrist extension 3-/5 WFL  Wrist ulnar deviation 4/5 WFL  Wrist radial deviation 3-/5 WFL  Wrist pronation    Wrist supination 3-/5 WFL  (Blank rows = not tested)  HAND FUNCTION: Grip strength: Right: 14 lbs; Left: 24 lbs, Lateral pinch: Right: 2 lbs, Left: 5  lbs, and 3 point pinch: Right: 1 lbs, Left: 4 lbs  COORDINATION:  SENSATION:  Tingling in the left thumb, 2nd,and 3rd digits.  EDEMA: N/A  COGNITION: Overall cognitive status: Within functional limits for tasks assessed  TREATMENT DATE: 03/25/24 Paraffin:  X10 min for R hand pain management/muscle relaxation in prep for therapeutic exercises.  Manual Therapy: -STM to volar wrist and hand, using Stickon bar for deeper tissue massage, short strokes, specifically to thenar and hypothenar eminence, carpal tunnel/volar wrist, and long strokes up the flexors in the forearm, working to reduce fascial restrictions, increase tissue extensibility, and increase circulation.  Therapeutic Exercise: -Passive wrist flex/ext, RD/UD with slight joint distraction -Passive radial abduction at the thumb with manual stretch to reduce stiffness within thenar eminence. -R wrist strengthening: 2# flex x3 sets 12 reps, 1# ext x3 sets 12 reps                                                                                                                            PATIENT EDUCATION: Education details: Condition management: recommendation to return to donning carpal tunnel brace for day time activities  Person educated: Patient Education method: Explanation Education comprehension: verbalized understanding and receptive to recommendation   HOME EXERCISE PROGRAM:  Pink theraputty HEP was modified to:   -pink resistive foam block for gross grip, lateral, and 3pt. Pinch strengthening, progressing to yellow theraputty.  GOALS: Goals reviewed with patient? Yes  SHORT TERM GOALS: Target date: 03/31/2024    Pt. will be independent with HEPs for the right hand  Baseline: No current HEP Goal status: INITIAL   LONG TERM GOALS: Target date: 05/12/2024  Pt. will decrease pain by 3 points on the pain scale Baseline: Eval: 6-7/10 right wrist Goal status: INITIAL  2.  Pt. Will increase right wrist ROM to be  to improve engagement in ADL/IADL tasks. Baseline: Eval: Wrist flexion: R: 56(64), L: 86; Wrist extension: R: 54(72), L: 84 Goal status: INITIAL  3.  Pt. Will improve right grip strength by 5# to be able to more securely hold pots/pans Baseline: Eval: R: 14#, L: 24# Goal status: INITIAL  4.  Pt. Will improve right pinch strength b 2# to be able open bottles and jars Baseline: Eval: lateral pinch: R: 2#, L: 5#, 3pt. Pinch: R: 1#, L: 4# Goal status: INITIAL  5.  Pt. Will recall and implement 3 compensatory/joint protection/work simplification strategies for ADLs. Baseline: Eval: Education to be provided Goal status: INITIAL  ASSESSMENT:  CLINICAL IMPRESSION: No increases in pain with initiation of light resistance dumbbells for R wrist strengthening, though pt remains with at least moderate pain levels consistently during the day.  Pt in agreement to begin wearing carpal tunnel brace during the day, removing for exercises/hand washing/as needed.  Will follow up with MD re: possibility of iontophoresis trial with dexamethasone  for an alternate pain management modality, as pt has had BS increases from previous steroids in the past.  Need to confirm whether this is an option for pt.  Pt. continues to benefit from  skilled OT services to work on improving right hand functioning in order to improve, and maximize independence with ADLs, and IADL tasks.  PERFORMANCE DEFICITS: in functional skills including ADLs, IADLs, coordination, dexterity, proprioception, ROM, strength, pain, Fine motor control, Gross motor control, and UE functional use, and psychosocial skills including coping strategies, environmental adaptation, interpersonal interactions, and routines and behaviors.   IMPAIRMENTS: are limiting patient from ADLs, IADLs, and leisure.   COMORBIDITIES: may have co-morbidities  that affects occupational performance. Patient will benefit from skilled OT to address above impairments and improve  overall function.  MODIFICATION OR ASSISTANCE TO COMPLETE EVALUATION: Min-Moderate modification of tasks or assist with assess necessary to complete an evaluation.  OT OCCUPATIONAL PROFILE AND HISTORY: Detailed assessment: Review of records and additional review of physical, cognitive, psychosocial history related to current functional performance.  CLINICAL DECISION MAKING: Moderate - several treatment options, min-mod task modification necessary  REHAB POTENTIAL: Good  EVALUATION COMPLEXITY: Moderate      PLAN:  OT FREQUENCY: 1-2x/week  OT DURATION: 12 weeks  PLANNED INTERVENTIONS: 97168 OT Re-evaluation, 97535 self care/ADL training, 02889 therapeutic exercise, 97530 therapeutic activity, 97112 neuromuscular re-education, 97140 manual therapy, 97018 paraffin, 02989 moist heat, 97010 cryotherapy, 97034 contrast bath, 97033 iontophoresis, 97760 Orthotic Initial, 97763 Orthotic/Prosthetic subsequent, energy conservation, patient/family education, and DME and/or AE instructions  RECOMMENDED OTHER SERVICES: N/A  CONSULTED AND AGREED WITH PLAN OF CARE: Patient  PLAN FOR NEXT SESSION: Treatment  Inocente Blazing, MS, OTR/L   03/25/2024, 11:55 AM   "

## 2024-03-26 ENCOUNTER — Other Ambulatory Visit: Payer: Self-pay | Admitting: Family Medicine

## 2024-03-26 DIAGNOSIS — F5104 Psychophysiologic insomnia: Secondary | ICD-10-CM

## 2024-03-27 ENCOUNTER — Ambulatory Visit: Payer: PRIVATE HEALTH INSURANCE

## 2024-03-27 DIAGNOSIS — M6281 Muscle weakness (generalized): Secondary | ICD-10-CM

## 2024-03-27 DIAGNOSIS — R278 Other lack of coordination: Secondary | ICD-10-CM

## 2024-03-27 DIAGNOSIS — G5601 Carpal tunnel syndrome, right upper limb: Secondary | ICD-10-CM

## 2024-03-27 NOTE — Therapy (Addendum)
 " OUTPATIENT OCCUPATIONAL THERAPY ORTHO TREATMENT NOTE  Patient Name: Claire [name] MRN: [id number] DOB:[birth year], 54 y.o., female Today's Date: 03/27/2024  PCP: Glenard Mire, MD REFERRING PROVIDER: Maree Hila, MD  END OF SESSION:  OT End of Session - 03/27/24 1617     Visit Number 9    Number of Visits 24    Date for Recertification  05/12/24    OT Start Time 1016    OT Stop Time 1105    OT Time Calculation (min) 49 min    Activity Tolerance Patient tolerated treatment well    Behavior During Therapy [facility] for tasks assessed/performed         Past Medical History:  Diagnosis Date   Allergy    All on file   Anemia    Anxiety    Arthritis    hands, hips   Asthma    Bilateral leg cramps    Depression    Diabetes mellitus without complication (HCC)    history no longer a problem since weight loss surgery   DJD (degenerative joint disease)    Dyspnea on exertion    a. 07/2019 Echo: EF 60-65%, no rwma, nl RV fxn, triv MR.   GERD (gastroesophageal reflux disease)    Headache    Migraines   IBS (irritable bowel syndrome)    Kidney stone    Low serum vitamin B12    Lung nodules    Migraine    down to approx 4x/mo since starting meds   Muscle spasm    Nonobstructive CAD (coronary artery disease)    a. 06/2014 ETT: Ex time 8:59, max HR 155, 10.1 METS, no ST/T changes; b. 08/2019 Cor CTA: Ca2+ 175 (99th %'ile), LAD 35-49p, LCX 0-42m-->Med rx.   Oxygen deficiency [phone]   Due to COVID   Psoriasis    vaginal area   Seasonal allergies    Sleep apnea    history no longer a problem since weight loss surgery   Vasovagal syncope    [place], Dr. Ferdinand    Vitamin D  deficiency    Past Surgical History:  Procedure Laterality Date   APPENDECTOMY     BLADDER SURGERY  2010   BREAST BIOPSY Right 2014   stereotatic biopsy   CHOLECYSTECTOMY  2010   COLONOSCOPY  2014   Done at [facility]   COLONOSCOPY WITH PROPOFOL  N/A 01/28/2018   Procedure: COLONOSCOPY WITH PROPOFOL ;   Surgeon: Janalyn Keene NOVAK, MD;  Location[facility];  Service: Endoscopy;  Laterality: N/A;   DILITATION & CURRETTAGE/HYSTROSCOPY WITH NOVASURE ABLATION N/A 06/16/2015   Procedure: DILATATION & CURETTAGE/HYSTEROSCOPY WITH NOVASURE ABLATION;  Surgeon: Charlie Flowers, MD;  Location: [facility];  Service: Gynecology;  Laterality: N/A;   ESOPHAGOGASTRODUODENOSCOPY N/A 10/24/2023   Procedure: EGD (ESOPHAGOGASTRODUODENOSCOPY);  Surgeon: Therisa Bi, MD;  Location[facility];  Service: Gastroenterology;  Laterality: N/A;   ESOPHAGOGASTRODUODENOSCOPY (EGD) WITH PROPOFOL  N/A 01/28/2018   Procedure: ESOPHAGOGASTRODUODENOSCOPY (EGD) WITH BIOPSIES;  Surgeon: Janalyn Keene NOVAK, MD;  Location: [facility];  Service: Endoscopy;  Laterality: N/A;   GASTRIC ROUX-EN-Y N/A 11/03/2019   Procedure: LAPAROSCOPIC 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 WITH UPPER ENDOSCOPY;  Surgeon: Gladis Cough, MD;  Location: [facility];  Service: General;  Laterality: N/A;   HERNIA REPAIR  2012   HIATAL HERNIA REPAIR N/A 11/03/2019   Procedure: HERNIA REPAIR HIATAL;  Surgeon: Gladis Cough, MD;  Location: [facility];  Service: General;  Laterality: N/A;   LAPAROSCOPIC GASTRIC SLEEVE RESECTION  2012   LAPAROSCOPY N/A 06/16/2015   Procedure: LAPAROSCOPY DIAGNOSTIC;  Surgeon: Charlie Flowers, MD;  Location: [facility];  Service: Gynecology;  Laterality: N/A;   LAPAROSCOPY N/A 04/01/2018   Procedure: LAPAROSCOPY DIAGNOSTIC ERAS PATHWAY ENTEROLYSIS, CECOPEXY;  Surgeon: Gladis Cough, MD;  Location: [facility];  Service: General;  Laterality: N/A;   LITHOTRIPSY  12/11/2017   laser lithotripsy   LYSIS OF ADHESION N/A 06/16/2015   Procedure: LYSIS OF ADHESION;  Surgeon: Charlie Flowers, MD;  Location: [facility];  Service: Gynecology;  Laterality: N/A;   PLANTAR FASCIA SURGERY Bilateral    ROBOTIC ASSISTED SALPINGO OOPHERECTOMY Right 06/16/2015   Procedure: ROBOTIC ASSISTED SALPINGO OOPHORECTOMY,  EXCISION OF RIGHT CUL DE SAC MASS;  Surgeon: Charlie Flowers, MD;  Location: [facility];  Service: Gynecology;  Laterality: Right;   TONSILLECTOMY     UPPER GI ENDOSCOPY     Patient Active Problem List   Diagnosis Date Noted   Primary osteoarthritis of right hip 12/10/2023   Chronic right hip pain 10/23/2023   Spondylosis of lumbar region without myelopathy or radiculopathy 09/23/2023   Myofascial pain syndrome 08/12/2023   Chronic pain syndrome 08/12/2023   Right shoulder pain 08/12/2023   Spondylolisthesis of lumbar region 01/25/2023   Morbid obesity with BMI of 40.0-44.9, adult (HCC) 07/10/2021   ADD (attention deficit disorder) 07/10/2021   Type 2 diabetes mellitus with obesity 07/10/2021   History of thyroiditis 07/10/2021   Fibromyalgia 07/10/2021   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/03/2019   B12 deficiency 04/10/2018   Columnar epithelial-lined lower esophagus    Postprocedural disorder of digestive system    Esophageal dysphagia    Persistent mood (affective) disorder, unspecified 01/15/2018   Insomnia related to another mental disorder 01/15/2018   OCD (obsessive compulsive disorder) 10/29/2017   GAD (generalized anxiety disorder) 10/29/2017   Nephrolithiasis 10/22/2017   Atherosclerosis of aorta 12/21/2016   Hiatal hernia 08/17/2016   Diverticulosis of colon 08/17/2016   Lumbosacral pain 01/07/2015   Intertrigo 11/05/2014   Perennial allergic rhinitis 09/03/2014   Lung nodules 09/03/2014   Vitamin D  deficiency 09/03/2014   History of kidney stones 09/03/2014   History of iron deficiency anemia 09/03/2014   Mild persistent asthma without complication 11/21/2012   GERD without esophagitis 11/21/2012   History of hyperlipidemia 11/21/2012   Migraine with aura and without status migrainosus 11/21/2012   History of sleep apnea 11/21/2012   IBS (irritable bowel  syndrome) 08/15/2012   ONSET DATE: 2020  REFERRING DIAG: Right Carpal Tunnel Syndrome  THERAPY DIAG:  Muscle weakness (generalized)  Other lack of coordination  Carpal tunnel syndrome on right  Rationale for Evaluation and Treatment: Rehabilitation  SUBJECTIVE:   SUBJECTIVE STATEMENT: Pt reports seeing the ENT yesterday who will start pt on a 10 day prednisone  regimen for a sinus and ear infection.  Pt will pick up med and begin today.  (No confirmation from Dr. Maree yet re: ok for dexamethasone  with iontophoresis, but will plan to hold this modality as pt will be on a steroid as noted above, which may also improve carpal tunnel symptoms).  Pt reports in the past, she has increased her DM meds to account for the effects of the BS increases from the steroids, and she checks her BS daily. Pt accompanied by: self  PERTINENT HISTORY: Pt. Is 54 y.o. female who was referred for OT services for right Carpal  Tunnel Syndrome. Pt. reports symptoms first started in 2020.  Pt. has had multiple steroid injections, however has recently been to receive additional injection due to elevation in A1C. Pt.'s symptoms have worsened over time, and are now affecting her ability to perform daily tasks without pain. Pt. Currently has a wrist support brace. 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  PRECAUTIONS: None  RED FLAGS: None   WEIGHT BEARING RESTRICTIONS: No  PAIN:  03/27/24: R hand pain 6/10 pain 03/25/24: R hand pain 6/10 pain, decreasing to 5/10 by end of session 03/19/24: R hand pain 7/10, decreasing to 5/10 by end of session 03/10/24: R hand pain 7/10  03/04/24: Right hand pain 7-8/10 02/26/24: R hand 7/10 Pain  Are you having pain? 6/10 pain upon arrival, 4-5/10 pain following treatment  LIVING ENVIRONMENT: Lives with: lives with their family Lives in:  House/apartment Stairs: 6 stairs, 1 and 1/2  levels  PLOF: Independent  PATIENT GOALS:  To decrease pain, and be able to use her right hand  OBJECTIVE:   Note: Objective measures were completed at Evaluation unless otherwise noted.  HAND DOMINANCE: Right  ADLs: MAM-20:  -Pt. Scored level 2 (very hard to do)-tie shoes, cut meat, wring towel, open medication bottle,  write 3-4 lines legibly, pick up a 1/2 full pitcher, turn a key. -Pt. Score level 3 (a little hard to do)-cut nails with a nail clipper, button clothes, take things out of wallet, open a previously opened wide mouth jar, handle money/coins, dial telephone numbers, wash hands, use both hands to eat.  Pt. Is unable to crochet, grip phone, and hold pots and pans 2/2 pain.  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 [employer]  FUNCTIONAL OUTCOME MEASURES: MAM-20-58/80: see above  UPPER EXTREMITY ROM:     Active ROM Right Eval WFL Left Eval WFL  Shoulder flexion    Shoulder abduction    Shoulder adduction    Shoulder extension    Shoulder internal rotation    Shoulder external rotation    Elbow flexion    Elbow extension    Wrist flexion 56(64) 86  Wrist extension 54(72) 84  Wrist ulnar deviation 32 34  Wrist radial deviation 6 20  Wrist pronation 90 90  Wrist supination 70 90  (Blank rows = not tested)  Active ROM Right Eval WFL Left Eval WFL  Thumb MCP (0-60)    Thumb IP (0-80)    Thumb Radial abd/add (0-55)     Thumb Palmar abd/add (0-45)     Thumb Opposition to Small Finger     Index MCP (0-90)     Index PIP (0-100)     Index DIP (0-70)      Long MCP (0-90)      Long PIP (0-100)      Long DIP (0-70)      Ring MCP (0-90)      Ring PIP (0-100)      Ring DIP (0-70)      Little MCP (0-90)      Little PIP (0-100)      Little DIP (0-70)      (Blank rows = not tested)  UPPER EXTREMITY MMT:     MMT Right eval Left eval  Shoulder flexion [facility]  Shoulder abduction [facility]   Shoulder adduction    Shoulder extension    Shoulder internal rotation    Shoulder  external rotation    Middle trapezius    Lower trapezius    Elbow flexion [facility] WFL  Elbow extension [facility] WFL  Wrist flexion 3-/5 WFL  Wrist extension 3-/5 WFL  Wrist ulnar deviation 4/5 WFL  Wrist radial deviation 3-/5 WFL  Wrist pronation    Wrist supination 3-/5 WFL  (Blank rows = not tested)  HAND FUNCTION: Grip strength: Right: 14 lbs; Left: 24 lbs, Lateral pinch: Right: 2 lbs, Left: 5 lbs, and 3 point pinch: Right: 1 lbs, Left: 4 lbs  COORDINATION:   SENSATION:  Tingling in the left thumb, 2nd,and 3rd digits.  EDEMA: N/A  COGNITION: Overall cognitive status: Within functional limits for tasks assessed  TREATMENT DATE: 03/27/24 Paraffin:  X10 min for R hand pain management/muscle relaxation in prep for therapeutic exercises.  Manual Therapy: -STM to volar wrist and hand, using Stickon bar for deeper tissue massage, short strokes, specifically to thenar and hypothenar eminence, carpal tunnel/volar wrist, and long strokes up the flexors in the forearm, working to reduce fascial restrictions, increase tissue extensibility, and increase circulation. -STM (skin pinching/rolling/lifting) to R volar forearm and upper arm along median nerve distribution, working to reduce facial restrictions, possibly contributing to pressure along the median nerve distribution.  Instructed pt in self manual technique; further training needed.   Therapeutic Exercise: -R grip strengthening with warm paraffin wax: 3 sets 10 reps, vc for passive wrist and digit ext stretch between sets -HEP adjustment; pt to avoid distal median nerve glide d/t increased R dorsal thumb and joint pain.  Reviewed proximal>distal/full arm median nerve glide bilaterally with pt able to demo good form/technique and no reports of pain. -Passive wrist flex/ext, RD/UD with slight joint distraction -Passive radial abduction at the thumb with  manual stretch to reduce stiffness within thenar eminence. -R wrist strengthening: 2# flex x3 sets 12 reps, 1# ext x3 sets 12 reps -Review of cervical AROM, including cervical flex/ext, lateral rotation, and lateral flex/ext with good return demo -Recommended shoulder circles/scapular with good return demo                                                                                                                            PATIENT EDUCATION: Education details: Condition management: continue brace for day time wear, ice, stretch, massage for pain management  Person educated: Patient Education method: Explanation Education comprehension: verbalized understanding and receptive to recommendations   HOME EXERCISE PROGRAM:  Pink theraputty HEP was modified to:   -pink resistive foam block for gross grip, lateral, and 3pt. Pinch strengthening, progressing to yellow theraputty.  GOALS: Goals reviewed with patient? Yes  SHORT TERM GOALS: Target date: 03/31/2024    Pt. will be independent with HEPs for the right hand  Baseline: No current HEP Goal status: INITIAL   LONG TERM GOALS: Target date: 05/12/2024  Pt. will decrease pain by 3 points on the pain scale Baseline: Eval: 6-7/10 right wrist Goal status: INITIAL  2.  Pt.  Will increase right wrist ROM to be to improve engagement in ADL/IADL tasks. Baseline: Eval: Wrist flexion: R: 56(64), L: 86; Wrist extension: R: 54(72), L: 84 Goal status: INITIAL  3.  Pt. Will improve right grip strength by 5# to be able to more securely hold pots/pans Baseline: Eval: R: 14#, L: 24# Goal status: INITIAL  4.  Pt. Will improve right pinch strength b 2# to be able open bottles and jars Baseline: Eval: lateral pinch: R: 2#, L: 5#, 3pt. Pinch: R: 1#, L: 4# Goal status: INITIAL  5.  Pt. Will recall and implement 3 compensatory/joint protection/work simplification strategies for ADLs. Baseline: Eval: Education to be provided Goal status:  INITIAL  ASSESSMENT:  CLINICAL IMPRESSION: Pt reports consistency to wear carpal tunnel brace during the day since last OT visit.  No increases in pain with dumbbells for R wrist strengthening or paraffin wax ball for grip strengthening.  Pt will begin a prednisone  regimen today from ENT yesterday for a sinus and ear infection, which may also help with carpal tunnel pain.  Pt verbalizes tenderness in R upper arm at the biceps, and volar forearm along median nerve distribution, with palpable tightness in soft tissues.  Initiated myofascial release techniques in the R forearm and upper arm for reducing tension on median nerve, tender but tolerated.  Will continue to focus on manual techniques to further break up tight fascia in this region.  Reassessment for 10th visit to be completed next session.  Pt. continues to benefit from skilled OT services to work on improving right hand functioning in order to improve, and maximize independence with ADLs, and IADL tasks.  PERFORMANCE DEFICITS: in functional skills including ADLs, IADLs, coordination, dexterity, proprioception, ROM, strength, pain, Fine motor control, Gross motor control, and UE functional use, and psychosocial skills including coping strategies, environmental adaptation, interpersonal interactions, and routines and behaviors.   IMPAIRMENTS: are limiting patient from ADLs, IADLs, and leisure.   COMORBIDITIES: may have co-morbidities  that affects occupational performance. Patient will benefit from skilled OT to address above impairments and improve overall function.  MODIFICATION OR ASSISTANCE TO COMPLETE EVALUATION: Min-Moderate modification of tasks or assist with assess necessary to complete an evaluation.  OT OCCUPATIONAL PROFILE AND HISTORY: Detailed assessment: Review of records and additional review of physical, cognitive, psychosocial history related to current functional performance.  CLINICAL DECISION MAKING: Moderate - several  treatment options, min-mod task modification necessary  REHAB POTENTIAL: Good  EVALUATION COMPLEXITY: Moderate      PLAN:  OT FREQUENCY: 1-2x/week  OT DURATION: 12 weeks  PLANNED INTERVENTIONS: 97168 OT Re-evaluation, 97535 self care/ADL training, 02889 therapeutic exercise, 97530 therapeutic activity, 97112 neuromuscular re-education, 97140 manual therapy, 97018 paraffin, 02989 moist heat, 97010 cryotherapy, 97034 contrast bath, 97033 iontophoresis, 97760 Orthotic Initial, 97763 Orthotic/Prosthetic subsequent, energy conservation, patient/family education, and DME and/or AE instructions  RECOMMENDED OTHER SERVICES: N/A  CONSULTED AND AGREED WITH PLAN OF CARE: Patient  PLAN FOR NEXT SESSION: Treatment  Inocente Blazing, MS, OTR/L   03/27/2024, 4:19 PM   "

## 2024-03-27 NOTE — Telephone Encounter (Signed)
 Requested Prescriptions  Pending Prescriptions Disp Refills   traZODone  (DESYREL ) 100 MG tablet [Pharmacy Med Name: TRAZODONE  100 MG TABLET] 180 tablet 0    Sig: TAKE 2 TABLETS BY MOUTH AT BEDTIME.     Psychiatry: Antidepressants - Serotonin Modulator Passed - 03/27/2024  1:42 PM      Passed - Valid encounter within last 6 months    Recent Outpatient Visits           3 months ago Well adult exam   [facility] Glenard Mire, MD   4 months ago Atherosclerosis of aorta   [facility] Glenard Mire, MD   10 months ago Type 2 diabetes mellitus with cardiac complication [name]   [facility] Sowles, Krichna, MD

## 2024-03-30 ENCOUNTER — Ambulatory Visit

## 2024-04-01 ENCOUNTER — Ambulatory Visit: Payer: PRIVATE HEALTH INSURANCE | Admitting: Occupational Therapy

## 2024-04-03 ENCOUNTER — Ambulatory Visit: Payer: PRIVATE HEALTH INSURANCE

## 2024-04-07 ENCOUNTER — Ambulatory Visit: Payer: PRIVATE HEALTH INSURANCE

## 2024-04-09 ENCOUNTER — Ambulatory Visit: Payer: PRIVATE HEALTH INSURANCE | Admitting: Occupational Therapy

## 2024-04-14 ENCOUNTER — Ambulatory Visit: Payer: PRIVATE HEALTH INSURANCE

## 2024-04-16 ENCOUNTER — Ambulatory Visit: Payer: PRIVATE HEALTH INSURANCE | Admitting: Occupational Therapy

## 2024-05-11 ENCOUNTER — Encounter: Payer: PRIVATE HEALTH INSURANCE | Admitting: Nurse Practitioner

## 2024-06-19 ENCOUNTER — Ambulatory Visit: Payer: PRIVATE HEALTH INSURANCE | Admitting: Family Medicine

## 2024-12-21 ENCOUNTER — Encounter: Payer: PRIVATE HEALTH INSURANCE | Admitting: Family Medicine
